# Patient Record
Sex: Female | Born: 1955
Health system: Southern US, Community
[De-identification: ages and names within clinical notes are randomized; demographics above are authoritative.]

## PROBLEM LIST (undated history)

## (undated) DIAGNOSIS — M069 Rheumatoid arthritis, unspecified: Secondary | ICD-10-CM

## (undated) DIAGNOSIS — D6949 Other primary thrombocytopenia: Secondary | ICD-10-CM

## (undated) DIAGNOSIS — M2550 Pain in unspecified joint: Secondary | ICD-10-CM

## (undated) DIAGNOSIS — M549 Dorsalgia, unspecified: Secondary | ICD-10-CM

## (undated) DIAGNOSIS — R002 Palpitations: Secondary | ICD-10-CM

## (undated) DIAGNOSIS — M1990 Unspecified osteoarthritis, unspecified site: Secondary | ICD-10-CM

## (undated) DIAGNOSIS — E785 Hyperlipidemia, unspecified: Secondary | ICD-10-CM

## (undated) DIAGNOSIS — R5383 Other fatigue: Secondary | ICD-10-CM

## (undated) DIAGNOSIS — Z5189 Encounter for other specified aftercare: Secondary | ICD-10-CM

## (undated) DIAGNOSIS — R252 Cramp and spasm: Secondary | ICD-10-CM

## (undated) DIAGNOSIS — R609 Edema, unspecified: Secondary | ICD-10-CM

## (undated) DIAGNOSIS — Z8619 Personal history of other infectious and parasitic diseases: Secondary | ICD-10-CM

## (undated) DIAGNOSIS — C50919 Malignant neoplasm of unspecified site of unspecified female breast: Secondary | ICD-10-CM

## (undated) DIAGNOSIS — M3119 Other thrombotic microangiopathy: Secondary | ICD-10-CM

## (undated) DIAGNOSIS — B1920 Unspecified viral hepatitis C without hepatic coma: Secondary | ICD-10-CM

## (undated) DIAGNOSIS — T4145XA Adverse effect of unspecified anesthetic, initial encounter: Secondary | ICD-10-CM

## (undated) DIAGNOSIS — Z923 Personal history of irradiation: Secondary | ICD-10-CM

## (undated) DIAGNOSIS — E119 Type 2 diabetes mellitus without complications: Secondary | ICD-10-CM

## (undated) DIAGNOSIS — H9319 Tinnitus, unspecified ear: Secondary | ICD-10-CM

## (undated) DIAGNOSIS — K219 Gastro-esophageal reflux disease without esophagitis: Secondary | ICD-10-CM

## (undated) DIAGNOSIS — R1310 Dysphagia, unspecified: Secondary | ICD-10-CM

## (undated) DIAGNOSIS — I499 Cardiac arrhythmia, unspecified: Secondary | ICD-10-CM

## (undated) DIAGNOSIS — H5710 Ocular pain, unspecified eye: Secondary | ICD-10-CM

## (undated) DIAGNOSIS — T7840XA Allergy, unspecified, initial encounter: Secondary | ICD-10-CM

## (undated) DIAGNOSIS — Z87898 Personal history of other specified conditions: Secondary | ICD-10-CM

## (undated) DIAGNOSIS — F419 Anxiety disorder, unspecified: Secondary | ICD-10-CM

## (undated) DIAGNOSIS — T8859XA Other complications of anesthesia, initial encounter: Secondary | ICD-10-CM

## (undated) DIAGNOSIS — E039 Hypothyroidism, unspecified: Secondary | ICD-10-CM

## (undated) DIAGNOSIS — M311 Thrombotic microangiopathy: Secondary | ICD-10-CM

## (undated) DIAGNOSIS — Z9109 Other allergy status, other than to drugs and biological substances: Secondary | ICD-10-CM

## (undated) DIAGNOSIS — G20A1 Parkinson's disease without dyskinesia, without mention of fluctuations: Secondary | ICD-10-CM

## (undated) DIAGNOSIS — R05 Cough: Secondary | ICD-10-CM

## (undated) DIAGNOSIS — K7460 Unspecified cirrhosis of liver: Secondary | ICD-10-CM

## (undated) DIAGNOSIS — M8580 Other specified disorders of bone density and structure, unspecified site: Secondary | ICD-10-CM

## (undated) DIAGNOSIS — R490 Dysphonia: Secondary | ICD-10-CM

## (undated) DIAGNOSIS — Z8679 Personal history of other diseases of the circulatory system: Secondary | ICD-10-CM

## (undated) DIAGNOSIS — R0602 Shortness of breath: Secondary | ICD-10-CM

## (undated) DIAGNOSIS — G709 Myoneural disorder, unspecified: Secondary | ICD-10-CM

## (undated) DIAGNOSIS — R059 Cough, unspecified: Secondary | ICD-10-CM

## (undated) DIAGNOSIS — R7303 Prediabetes: Secondary | ICD-10-CM

## (undated) DIAGNOSIS — M7989 Other specified soft tissue disorders: Secondary | ICD-10-CM

## (undated) DIAGNOSIS — R209 Unspecified disturbances of skin sensation: Secondary | ICD-10-CM

## (undated) HISTORY — DX: Tinnitus, unspecified ear: H93.19

## (undated) HISTORY — DX: Pain in unspecified joint: M25.50

## (undated) HISTORY — DX: Cough, unspecified: R05.9

## (undated) HISTORY — DX: Cardiac arrhythmia, unspecified: I49.9

## (undated) HISTORY — DX: Type 2 diabetes mellitus without complications: E11.9

## (undated) HISTORY — PX: OTHER SURGICAL HISTORY: SHX169

## (undated) HISTORY — DX: Dorsalgia, unspecified: M54.9

## (undated) HISTORY — PX: TOTAL HIP ARTHROPLASTY: SHX124

## (undated) HISTORY — DX: Personal history of irradiation: Z92.3

## (undated) HISTORY — DX: Hyperlipidemia, unspecified: E78.5

## (undated) HISTORY — DX: Personal history of other diseases of the circulatory system: Z86.79

## (undated) HISTORY — DX: Other specified soft tissue disorders: M79.89

## (undated) HISTORY — DX: Personal history of other infectious and parasitic diseases: Z86.19

## (undated) HISTORY — DX: Unspecified osteoarthritis, unspecified site: M19.90

## (undated) HISTORY — PX: POLYPECTOMY: SHX149

## (undated) HISTORY — DX: Unspecified disturbances of skin sensation: R20.9

## (undated) HISTORY — DX: Other fatigue: R53.83

## (undated) HISTORY — DX: Allergy, unspecified, initial encounter: T78.40XA

## (undated) HISTORY — DX: Other specified disorders of bone density and structure, unspecified site: M85.80

## (undated) HISTORY — DX: Myoneural disorder, unspecified: G70.9

## (undated) HISTORY — DX: Unspecified cirrhosis of liver: K74.60

## (undated) HISTORY — DX: Dysphagia, unspecified: R13.10

## (undated) HISTORY — DX: Parkinson's disease without dyskinesia, without mention of fluctuations: G20.A1

## (undated) HISTORY — DX: Gastro-esophageal reflux disease without esophagitis: K21.9

## (undated) HISTORY — DX: Malignant neoplasm of unspecified site of unspecified female breast: C50.919

## (undated) HISTORY — DX: Prediabetes: R73.03

## (undated) HISTORY — DX: Other primary thrombocytopenia: D69.49

## (undated) HISTORY — DX: Unspecified viral hepatitis C without hepatic coma: B19.20

## (undated) HISTORY — DX: Ocular pain, unspecified eye: H57.10

## (undated) HISTORY — DX: Cough: R05

## (undated) HISTORY — PX: CATARACT EXTRACTION, BILATERAL: SHX1313

## (undated) HISTORY — DX: Palpitations: R00.2

## (undated) HISTORY — PX: FOOT SURGERY: SHX648

## (undated) HISTORY — DX: Dysphonia: R49.0

## (undated) HISTORY — DX: Rheumatoid arthritis, unspecified: M06.9

## (undated) HISTORY — DX: Cramp and spasm: R25.2

## (undated) HISTORY — DX: Personal history of other specified conditions: Z87.898

---

## 2007-06-22 LAB — HM COLONOSCOPY

## 2010-08-31 DIAGNOSIS — C50919 Malignant neoplasm of unspecified site of unspecified female breast: Secondary | ICD-10-CM

## 2010-08-31 HISTORY — DX: Malignant neoplasm of unspecified site of unspecified female breast: C50.919

## 2011-06-30 LAB — HM PAP SMEAR: HM Pap smear: NORMAL

## 2011-07-03 ENCOUNTER — Other Ambulatory Visit: Payer: Self-pay | Admitting: *Deleted

## 2011-07-03 ENCOUNTER — Other Ambulatory Visit: Payer: Self-pay | Admitting: Radiology

## 2011-07-03 DIAGNOSIS — C50912 Malignant neoplasm of unspecified site of left female breast: Secondary | ICD-10-CM

## 2011-07-03 DIAGNOSIS — C50919 Malignant neoplasm of unspecified site of unspecified female breast: Secondary | ICD-10-CM

## 2011-07-06 ENCOUNTER — Other Ambulatory Visit: Payer: Self-pay | Admitting: *Deleted

## 2011-07-06 DIAGNOSIS — C50919 Malignant neoplasm of unspecified site of unspecified female breast: Secondary | ICD-10-CM

## 2011-07-07 ENCOUNTER — Ambulatory Visit
Admission: RE | Admit: 2011-07-07 | Discharge: 2011-07-07 | Disposition: A | Discharge status: Home / Self Care | Payer: Managed Care, Other (non HMO) | Source: Ambulatory Visit | Attending: Radiology | Admitting: Radiology

## 2011-07-07 ENCOUNTER — Other Ambulatory Visit: Payer: Self-pay

## 2011-07-07 DIAGNOSIS — C50912 Malignant neoplasm of unspecified site of left female breast: Secondary | ICD-10-CM

## 2011-07-07 MED ORDER — GADOBENATE DIMEGLUMINE 529 MG/ML IV SOLN
20.0000 mL | Freq: Once | INTRAVENOUS | Status: AC | PRN
  Administered 2011-07-07: 20 mL via INTRAVENOUS

## 2011-07-08 ENCOUNTER — Encounter: Payer: Self-pay | Admitting: Oncology

## 2011-07-08 ENCOUNTER — Ambulatory Visit: Payer: Managed Care, Other (non HMO)

## 2011-07-08 ENCOUNTER — Ambulatory Visit (HOSPITAL_BASED_OUTPATIENT_CLINIC_OR_DEPARTMENT_OTHER): Payer: Managed Care, Other (non HMO) | Admitting: General Surgery

## 2011-07-08 ENCOUNTER — Ambulatory Visit
Admission: RE | Admit: 2011-07-08 | Discharge: 2011-07-08 | Disposition: A | Discharge status: Home / Self Care | Payer: Managed Care, Other (non HMO) | Source: Ambulatory Visit | Attending: Radiation Oncology | Admitting: Radiation Oncology

## 2011-07-08 ENCOUNTER — Ambulatory Visit: Payer: Managed Care, Other (non HMO) | Attending: General Surgery | Admitting: Physical Therapy

## 2011-07-08 ENCOUNTER — Encounter (INDEPENDENT_AMBULATORY_CARE_PROVIDER_SITE_OTHER): Payer: Self-pay | Admitting: General Surgery

## 2011-07-08 ENCOUNTER — Other Ambulatory Visit (HOSPITAL_BASED_OUTPATIENT_CLINIC_OR_DEPARTMENT_OTHER): Payer: Managed Care, Other (non HMO) | Admitting: Lab

## 2011-07-08 ENCOUNTER — Ambulatory Visit (HOSPITAL_BASED_OUTPATIENT_CLINIC_OR_DEPARTMENT_OTHER): Payer: Managed Care, Other (non HMO) | Admitting: Oncology

## 2011-07-08 VITALS — BP 122/77 | HR 66 | Temp 98.6°F | Ht 67.0 in | Wt 248.9 lb

## 2011-07-08 DIAGNOSIS — C50919 Malignant neoplasm of unspecified site of unspecified female breast: Secondary | ICD-10-CM

## 2011-07-08 DIAGNOSIS — Z170 Estrogen receptor positive status [ER+]: Secondary | ICD-10-CM

## 2011-07-08 DIAGNOSIS — C50419 Malignant neoplasm of upper-outer quadrant of unspecified female breast: Secondary | ICD-10-CM

## 2011-07-08 DIAGNOSIS — IMO0001 Reserved for inherently not codable concepts without codable children: Secondary | ICD-10-CM | POA: Insufficient documentation

## 2011-07-08 DIAGNOSIS — D0590 Unspecified type of carcinoma in situ of unspecified breast: Secondary | ICD-10-CM | POA: Insufficient documentation

## 2011-07-08 LAB — CBC WITH DIFFERENTIAL/PLATELET
BASO%: 0.2 % (ref 0.0–2.0)
Basophils Absolute: 0 10*3/uL (ref 0.0–0.1)
EOS%: 6.5 % (ref 0.0–7.0)
Eosinophils Absolute: 0.3 10*3/uL (ref 0.0–0.5)
HCT: 40.1 % (ref 34.8–46.6)
HGB: 13.4 g/dL (ref 11.6–15.9)
LYMPH%: 16.4 % (ref 14.0–49.7)
MCH: 32 pg (ref 25.1–34.0)
MCHC: 33.4 g/dL (ref 31.5–36.0)
MCV: 95.9 fL (ref 79.5–101.0)
MONO#: 0.4 10*3/uL (ref 0.1–0.9)
MONO%: 8.3 % (ref 0.0–14.0)
NEUT#: 3.6 10*3/uL (ref 1.5–6.5)
NEUT%: 68.6 % (ref 38.4–76.8)
Platelets: 136 10*3/uL — ABNORMAL LOW (ref 145–400)
RBC: 4.19 10*6/uL (ref 3.70–5.45)
RDW: 14.2 % (ref 11.2–14.5)
WBC: 5.3 10*3/uL (ref 3.9–10.3)
lymph#: 0.9 10*3/uL (ref 0.9–3.3)

## 2011-07-08 LAB — COMPREHENSIVE METABOLIC PANEL
ALT: 38 U/L — ABNORMAL HIGH (ref 0–35)
AST: 34 U/L (ref 0–37)
Albumin: 3.7 g/dL (ref 3.5–5.2)
Alkaline Phosphatase: 63 U/L (ref 39–117)
BUN: 10 mg/dL (ref 6–23)
CO2: 30 mEq/L (ref 19–32)
Calcium: 9 mg/dL (ref 8.4–10.5)
Chloride: 102 mEq/L (ref 96–112)
Creatinine, Ser: 0.75 mg/dL (ref 0.50–1.10)
Glucose, Bld: 102 mg/dL — ABNORMAL HIGH (ref 70–99)
Potassium: 3.9 mEq/L (ref 3.5–5.3)
Sodium: 142 mEq/L (ref 135–145)
Total Bilirubin: 0.4 mg/dL (ref 0.3–1.2)
Total Protein: 7.3 g/dL (ref 6.0–8.3)

## 2011-07-08 LAB — CANCER ANTIGEN 27.29: CA 27.29: 29 U/mL (ref 0–39)

## 2011-07-08 NOTE — Progress Notes (Signed)
[facility] Radiation Oncology NEW PATIENT EVALUATION  Name: Tara [name] MRN: [id number]  Date: 07/08/2011  DOB: [birth year]  Status:outpatient    CC:No primary provider on file.  Robyne Askew, MD    REFERRING PHYSICIAN: Robyne Askew, MD   DIAGNOSIS: Stage I (T1, N0, M0) invasive ductal/DCIS of the left breast    HISTORY OF PRESENT ILLNESS::Tara [name] is a 55 y.o. female who is seen today for evaluation of her stage I (T1, N0, M0) invasive ductal/DCIS of the left breast. Her to moving to [facility], she noted a mass within the tail of the left breast. She underwent mammography on 06/24/2011 at [facility] in [place]. This showed a suspicious abnormality in the left breast measuring approximately 1.0 cm. A tiny cluster of microcalcifications were seen for posteriorly in the axillary tail of the left breast. On ultrasound the mass measured 1.1 x 0.9 x 0.7 cm. A core needle biopsy 06/25/2011 was diagnostic for invasive ductal carcinoma, grade 3, measuring 0.5 cm in greatest dimension. Her tumor was ER/PR positive with a proliferation index of 25%. She also had DCIS, intermediate grade to high grade with focal central necrosis. Microcalcifications were present and associated with carcinoma. I am told that her tumor was HER-2/neu positive she and her husband moved to [facility] and referral to the [facility] C. was made by Dr. Isaiah Serge. Breast MRI on 07/07/2011 showed a 1.6 x 1.4 x 1.1 cm irregular enhancing mass deep within the upper outer quadrant of left breast with an adjacent biopsy marker clip. No other suspicious findings were noted. She is without complaints today. She seen today along with Dr. Carolynne Edouard and also Dr. Darnelle Catalan.    PREVIOUS RADIATION THERAPY: No   PAST MEDICAL HISTORY:  has a past medical history of Thrombocytopenia, primary; Hepatitis C; Asthma; and Arthritis.     PAST SURGICAL HISTORY: Past Surgical History  Procedure Date  . Elbow pins   .  Right hip replacement   . Left foot reconstruction   . Ortho left foot surg    . Joint replacement      ETIOLOGIC FACTORS:   FAMILY HISTORY: family history is not on file.   SOCIAL HISTORY:  reports that she has never smoked. She does not have any smokeless tobacco history on file. She reports that she drinks about 1.2 ounces of alcohol per week.   ALLERGIES: Aspirin   MEDICATIONS:Current outpatient prescriptions:budesonide (PULMICORT) 180 MCG/ACT inhaler, Inhale 1 puff into the lungs 2 (two) times daily.  , Disp: , Rfl: ;  calcium carbonate (TUMS - DOSED IN MG ELEMENTAL CALCIUM) 500 MG chewable tablet, Chew 1 tablet by mouth daily.  , Disp: , Rfl: ;  citalopram (CELEXA) 20 MG tablet, Take 20 mg by mouth daily.  , Disp: , Rfl: ;  digoxin (LANOXIN) 0.25 MG tablet, Take 250 mcg by mouth daily.  , Disp: , Rfl:  glucosamine-chondroitin 500-400 MG tablet, Take 1 tablet by mouth 3 (three) times daily.  , Disp: , Rfl: ;  levothyroxine (SYNTHROID, LEVOTHROID) 112 MCG tablet, Take 0.5 mcg by mouth daily.  , Disp: , Rfl: ;  montelukast (SINGULAIR) 10 MG tablet, Take 10 mg by mouth at bedtime.  , Disp: , Rfl: ;  vitamin B-12 (CYANOCOBALAMIN) 100 MCG tablet, Take 50 mcg by mouth daily.  , Disp: , Rfl:    REVIEW OF SYSTEMS:  Detailed ROS obtained by Dr. Darnelle Catalan, reviewed, and placed in EMR.    PHYSICAL EXAM: Alert and  oriented 55 year old white female appearing her stated age. Vital signs blood pressure 122/77 pulse 66 respiratory rate 20 Head and neck examination grossly unremarkable. Nodes: Without palpable cervical, supraclavicular, or axillary lymphadenopathy. Chest: Lungs clear. Back: Without spinal discomfort. Heart: Regular in rhythm. Breasts: There is a palpable mass along the axillary tail of the left breast at 2:00. The mass is approximately 2.0 cm in greatest diameter. There is overlying biopsy wound. No other masses are appreciated. Right breast without masses or lesions. Abdomen without  hepatomegaly. Extremities 2+ ankle edema. Neurologic examination grossly nonfocal.   LABORATORY DATA:  Results for orders placed in visit on 07/08/11 (from the past 48 hour(s))  CBC WITH DIFFERENTIAL     Status: Abnormal   Collection Time   07/08/11 12:27 PM      Component Value Range Comment   WBC 5.3  3.9 - 10.3 (10e3/uL)    NEUT# 3.6  1.5 - 6.5 (10e3/uL)    HGB 13.4  11.6 - 15.9 (g/dL)    HCT 40.9  81.1 - 91.4 (%)    Platelets 136 (*) 145 - 400 (10e3/uL)    MCV 95.9  79.5 - 101.0 (fL)    MCH 32.0  25.1 - 34.0 (pg)    MCHC 33.4  31.5 - 36.0 (g/dL)    RBC 7.82  9.56 - 2.13 (10e6/uL)    RDW 14.2  11.2 - 14.5 (%)    lymph# 0.9  0.9 - 3.3 (10e3/uL)    MONO# 0.4  0.1 - 0.9 (10e3/uL)    Eosinophils Absolute 0.3  0.0 - 0.5 (10e3/uL)    Basophils Absolute 0.0  0.0 - 0.1 (10e3/uL)    NEUT% 68.6  38.4 - 76.8 (%)    LYMPH% 16.4  14.0 - 49.7 (%)    MONO% 8.3  0.0 - 14.0 (%)    EOS% 6.5  0.0 - 7.0 (%)    BASO% 0.2  0.0 - 2.0 (%)   COMPREHENSIVE METABOLIC PANEL     Status: Abnormal   Collection Time   07/08/11 12:27 PM      Component Value Range Comment   Sodium 142  135 - 145 (mEq/L)    Potassium 3.9  3.5 - 5.3 (mEq/L)    Chloride 102  96 - 112 (mEq/L)    CO2 30  19 - 32 (mEq/L)    Glucose, Bld 102 (*) 70 - 99 (mg/dL)    BUN 10  6 - 23 (mg/dL)    Creatinine, Ser 0.86  0.50 - 1.10 (mg/dL)    Total Bilirubin 0.4  0.3 - 1.2 (mg/dL)    Alkaline Phosphatase 63  39 - 117 (U/L)    AST 34  0 - 37 (U/L)    ALT 38 (*) 0 - 35 (U/L)    Total Protein 7.3  6.0 - 8.3 (g/dL)    Albumin 3.7  3.5 - 5.2 (g/dL)    Calcium 9.0  8.4 - 10.5 (mg/dL)        RADIOGRAPHY: Mr Breast Bilateral W Wo Contrast  07/07/2011  *RADIOLOGY REPORT*  Clinical Data: Recently diagnosed left breast invasive ductal carcinoma and ductal carcinoma in situ.  BUN and creatinine were obtained on site at [facility] at [address] Results:  BUN 10 mg/dL,  Creatinine 0.7 mg/dL.  BILATERAL BREAST MRI WITH AND  WITHOUT CONTRAST  Technique: Multiplanar, multisequence MR images of both breasts were obtained prior to and following the intravenous administration of 20ml of MultiHance.  Three dimensional images  were evaluated at the independent DynaCad workstation.  Comparison:  Recent mammogram, ultrasound and biopsy examinations at [facility] in [place], [place].  Findings: 1.6 x 1.4 x 1.1 cm irregular enhancing mass deep in the upper outer quadrant of the left breast with an adjacent biopsy marker clip artifact medially.  This mass has predominately persistent enhancement kinetics.  Minimal background parenchymal enhancement elsewhere in both breasts with no additional masses or areas of enhancement suspicious for malignancy in either breast.  No abnormal appearing lymph nodes.  A partially included a 1.2 cm cyst is noted in the right lobe of the liver.  IMPRESSION: 1.6 x 1.4 x 1.1 cm biopsy-proven invasive ductal carcinoma and ductal carcinoma in situ deep in the upper outer quadrant of the left breast.  Otherwise, unremarkable examination.  THREE-DIMENSIONAL MR IMAGE RENDERING ON INDEPENDENT WORKSTATION:  Three-dimensional MR images were rendered by post-processing of the original MR data on an independent workstation.  The three- dimensional MR images were interpreted, and findings were reported in the accompanying complete MRI report for this study.  BI-RADS CATEGORY 6:  Known biopsy-proven malignancy - appropriate action should be taken.  Recommendation:  Treatment plan.  Original Report Authenticated By: Darrol Angel, M.D.       IMPRESSION: Stage I (T1, N0, M0) invasive ductal/DCIS of the left breast. I explained to the patient and her husband that her local treatment options include mastectomy or partial mastectomy followed by radiation therapy. She will also require a sentinel lymph node biopsy. Dr. Darnelle Catalan indicates that she will be offered systemic therapy including Herceptin for one year. I can see  her back for a followup visit following her surgery to finalize a treatment plan. I would consider obtaining a postoperative mammogram at least one month following her surgery to confirm removal of all suspicious microcalcifications. From a technical standpoint, if she is node negative, we could consider prone breast radiation provided that we can encompass her tumor bed. If not, we could consider deep inspiration and breath-hold tangential field radiation therapy to avoid cardiac toxicity.   PLAN: As discussed above. Followup visit with me postoperatively. I spent 40 minutes minutes face to face with the patient and more than 50% of that time was spent in counseling and/or coordination of care.

## 2011-07-08 NOTE — Progress Notes (Signed)
Tara [name] is an 55 y.o. female.    Chief Complaint  Patient presents with  . Breast Cancer    HPI: Tara [name] is a 55 year old woman from [place], [place], recently moved to [place] and establishing yourself in her care today  The patient noted a mass in her left breast and brought her to her physician's attention in [place]. She was developed for mammography 06/24/2011 at [facility] in [place]. This showed a suspicious abnormality in the left breast measuring approximately 10 mm. Physical examination showed a 1 cm firm superficial mass in the axillary region of the breast and ultrasound showed this to be 11 mm and spiculated. Biopsy was performed October 25 and showed ([id number] at the [facility]) and invasive ductal carcinoma, grade 3, estrogen receptor 95% positive progesterone receptor 40% positive with an MIB-1 of 29% the tumor is HDR 2 positive by FISH with a ratio of 2.5. There is tumor heterogeneity noted.  With this information the case was presented this morning at the multidisciplinary breast cancer conference and is being again reviewed this afternoon at the multidisciplinary breast cancer clinic  Past Medical History  Diagnosis Date  Thrombotic thrombocytopenia purpura was diagnosed at age 58 treated with vincristine and pheresis x32, no splenectomy; she had recurrence at age 67 while being treated with interferon for hepatitis C.     . Hepatitis C   . Asthma   . Arthritis     Past Surgical History  Procedure Date  . Elbow pins   . Right hip replacement   . Left foot reconstruction   . 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 Her husband [name] at present today is a Magazine features editor. They recently moved here from [place] and still have not settle down on house or apartment  She  reports that she has never smoked. She does not have any smokeless tobacco history on file. She reports that she drinks about 1.2 ounces of alcohol per week.   Health maintenance:  Cholesterol  Bone density November 20 11th showed a T score of +0.9 and this is normal   Colonoscopy   Most recent Pap smear October 20 12th    the patient does have advanced directives in place   Allergies:  Allergies  Allergen Reactions  . Aspirin     On TTP    No current outpatient prescriptions on file as of 07/08/2011.   Medications Prior to Admission  Medication Dose Route Frequency Provider Last Rate Last Dose  . gadobenate dimeglumine (MULTIHANCE) injection 20 mL  20 mL Intravenous Once PRN Medication Radiologist   20 mL at 07/07/11 1027    ROS she reports no symptoms suggestive of metastatic disease she does have a history of a chronic fatigue occasional leg cramping and she has had chronic bilateral lower extremity swelling since she was a child she doesn't usually where compression stockings for this. He is a little bit of ringing in her ear is just a bridge but  otherwise no problems with her teeth she has a history of palpitations this has been evaluated previously and found to be stress related it is much better now that she is on antidepressants. She has a history of hepatitis C positivity is noted but this has not been active she has a history of eczema and some arthritis but overall he detailed review of systems today was noncontributory   Physical Exam:  Blood pressure 122/77, pulse 66, temperature 98.6 F (37 C), height 5\' 7"  (1.702 m), weight 248 lb 14.4 oz (112.9 kg).  Sclerae unicteric Oropharynx clear No  peripheral adenopathy Lungs no rales or rhonchi Heart regular rate and rhythm Abd benign MSK no focal spinal tenderness, no peripheral edema Neuro: nonfocal Breasts: Right breast no suspicious masses left breast I do not palpate any masses in the left axilla and I do not palpate a well-defined mass in the left breast itself the skin is unremarkable there is no nipple retraction   CBC Lab Results  Component Value Date   HGB 13.4 07/08/2011   HCT 40.1 07/08/2011   MCV 95.9 07/08/2011   PLT 136* 07/08/2011   CMP     Component Value Date/Time   NA 142 07/08/2011 1227   K 3.9 07/08/2011 1227   CL 102 07/08/2011 1227   CO2 30 07/08/2011 1227   GLUCOSE 102* 07/08/2011 1227   BUN 10 07/08/2011 1227   CREATININE 0.75 07/08/2011 1227   CALCIUM 9.0 07/08/2011 1227   PROT 7.3 07/08/2011 1227   ALBUMIN 3.7 07/08/2011 1227   AST 34 07/08/2011 1227   ALT 38* 07/08/2011 1227   ALKPHOS 63 07/08/2011 1227   BILITOT 0.4 07/08/2011 1227     Mr Breast Bilateral W Wo Contrast  07/07/2011  *RADIOLOGY REPORT*  Clinical Data: Recently diagnosed left breast invasive ductal carcinoma and ductal carcinoma in situ.  BUN and creatinine were obtained on site at [facility] at [address] Results:  BUN 10 mg/dL,  Creatinine 0.7 mg/dL.  BILATERAL BREAST MRI WITH AND WITHOUT CONTRAST  Technique: Multiplanar, multisequence MR images of both breasts were obtained prior to and following the intravenous administration of 20ml of MultiHance.  Three dimensional images were evaluated at the independent DynaCad workstation.  Comparison:  Recent mammogram, ultrasound and biopsy examinations at [facility] in [place], [place].  Findings: 1.6 x 1.4 x 1.1 cm irregular enhancing mass deep in the upper outer quadrant of the left breast with an adjacent biopsy marker clip artifact medially.  This mass has predominately persistent enhancement kinetics.  Minimal background parenchymal enhancement elsewhere in both breasts  with no additional masses or areas of enhancement suspicious for malignancy in either breast.  No abnormal appearing lymph nodes.  A partially included a 1.2 cm cyst is noted in the right lobe of the liver.  IMPRESSION: 1.6 x 1.4 x 1.1 cm biopsy-proven invasive ductal carcinoma and ductal carcinoma in situ deep in the upper outer quadrant of the left breast.  Otherwise, unremarkable examination.  THREE-DIMENSIONAL MR IMAGE RENDERING ON INDEPENDENT WORKSTATION:  Three-dimensional MR images were rendered by post-processing of the original MR data on an independent workstation.  The three- dimensional MR images were interpreted, and findings were reported in the accompanying complete MRI report for this study.  BI-RADS CATEGORY 6:  Known biopsy-proven malignancy - appropriate action should be taken.  Recommendation:  Treatment plan.  Original Report Authenticated By: Darrol Angel, M.D.     Assessment: 55 year old [place] woman recently moved  here from [place] with a new diagnosis of invasive ductal breast cancer made through left breast biopsy October of 2012 for what by MRI is a 1.6 cm tumor, pathologically grade 3, triple positive, with some evidence of tumor heterogeneity, and MIB-1 of 25%  Plan: The patient's situation is complex of it she were not HER-2 positive then as a stage I patient and her mid 64s I would recommend an Oncotype however being HER-2 positive she does not really fit well in the adjuvant! Database and her risk of recurrence is likely higher than would be predicted there are nurses it predicts 31% is probably closer to 40-45% given the HER-2 positivity assuming as she would not take and the HER-2 medication).  Accordingly I would offer this patient chemotherapy specifically  cyclophosphamide and docetaxel given by port every 3 weeks x4 she would receive Herceptin every 3 weeks for one year after she completes her radiation following chemotherapy she would then go on anti-estrogens for 5  years I believe that with this treatment which is standard her risk of recurrence within 10 years would be well less than 10%.  We discussed side effects toxicities and complications of treatment she will also come to chemotherapy school and she will have an echocardiogram performed before her upcoming surgery she will have a polyp were placed at the time of surgery and she will return to see me the first week in December so we can operation to lyse her chemotherapy plans and writer the prescriptions for the nausea and the other preventive medicines.  Imogine Carvell C 07/08/2011, 2:37 PM

## 2011-07-08 NOTE — Progress Notes (Signed)
Subjective:     Patient ID: Tara [name], female   DOB: [birth year], 55 y.o.   MRN: [id number]  HPI We're asked to see the patient in consultation by Dr. Zella Richer to evaluate her for a left breast cancer. The patient is a 55 year old white female who recently felt a lump in the upper outer portion of her left breast.at the time she was living in [place]. She brought this to her medical doctor's attention. A mammogram and biopsies were obtained that revealed an invasive ductal cancer. An MRI was obtained that estimated the size of the tumor 1.6 cm. The tumor is located in the tail of [name]. She denies any breast pain. She denies any discharge from the nipple. She was ER PR positive and HER-2 positive.  Review of Systems  Constitutional: Negative.   HENT: Negative.   Eyes: Negative.   Respiratory: Negative.   Cardiovascular: Positive for palpitations.  Gastrointestinal: Negative.   Genitourinary: Negative.   Musculoskeletal: Negative.   Skin: Negative.   Neurological: Negative.   Hematological: Bruises/bleeds easily.  Psychiatric/Behavioral: Negative.        Objective:   Physical Exam  Constitutional: She is oriented to person, place, and time. She appears well-developed and well-nourished.  HENT:  Head: Normocephalic and atraumatic.  Eyes: Conjunctivae and EOM are normal. Pupils are equal, round, and reactive to light.  Neck: Normal range of motion. Neck supple.  Cardiovascular: Normal rate, regular rhythm and normal heart sounds.   Pulmonary/Chest: Effort normal and breath sounds normal.       The patient has a palpable mass approximately 2 cm in diameter in the upper outer quadrant of the left breast. No axillary supraclavicular cervical lymphadenopathy.  Abdominal: Soft. Bowel sounds are normal. She exhibits no mass. There is no tenderness.  Musculoskeletal: Normal range of motion.  Neurological: She is alert and oriented to person, place, and time.  Skin: Skin is warm and  dry.  Psychiatric: She has a normal mood and affect. Her behavior is normal.       Assessment:     The patient has a 1.6 cm area of invasive breast cancer in the upper outer quadrant of the left breast.I've discussed with her the options for surgery and I think she would be a good candidate for breast conservation and sentinel node mapping. She would like to pursue breast conservation. I've discussed with her in detail the risks and benefits of the operation as well as some of the technical aspects and she understands and wishes to proceed. She may also need a port. It may depend on what her echocardiogram looks like some she does have some history of heart disease. I've discussed this with her as well including the risks and benefits as well as some of the technical aspects and she understands and wishes to proceed    Plan:     First the patient will get an echocardiogram to evaluate her heart function. After that we will plan for lumpectomy and sentinel node mapping with possible port placement

## 2011-07-09 ENCOUNTER — Encounter: Payer: Self-pay | Admitting: *Deleted

## 2011-07-09 ENCOUNTER — Telehealth: Payer: Self-pay | Admitting: *Deleted

## 2011-07-09 ENCOUNTER — Other Ambulatory Visit (INDEPENDENT_AMBULATORY_CARE_PROVIDER_SITE_OTHER): Payer: Self-pay | Admitting: General Surgery

## 2011-07-10 ENCOUNTER — Other Ambulatory Visit (INDEPENDENT_AMBULATORY_CARE_PROVIDER_SITE_OTHER): Payer: Self-pay | Admitting: General Surgery

## 2011-07-10 ENCOUNTER — Telehealth: Payer: Self-pay | Admitting: Oncology

## 2011-07-10 ENCOUNTER — Telehealth: Payer: Self-pay | Admitting: *Deleted

## 2011-07-10 NOTE — Telephone Encounter (Signed)
Spoke to pt concerning [facility] from 07/08/11.  Discussed dx and staging.  Confirmed dates for echo, chemotherapy school, f/u with Dr. Darnelle Catalan and [place] [facility].  Pt denies needs or concerns at this time.  Encourage pt to call with questions.  Received verbal understanding.  Contact information given.

## 2011-07-10 NOTE — Telephone Encounter (Signed)
Called pt and r/s appt on 12/03 per pts request to 12/10

## 2011-07-15 ENCOUNTER — Other Ambulatory Visit (HOSPITAL_COMMUNITY): Payer: Managed Care, Other (non HMO)

## 2011-07-16 ENCOUNTER — Encounter: Payer: Self-pay | Admitting: *Deleted

## 2011-07-16 NOTE — Progress Notes (Signed)
Mailed after appt letter to pt. 

## 2011-07-20 ENCOUNTER — Other Ambulatory Visit: Payer: Managed Care, Other (non HMO)

## 2011-07-20 ENCOUNTER — Encounter (HOSPITAL_COMMUNITY): Payer: Self-pay | Admitting: Pharmacy Technician

## 2011-07-20 ENCOUNTER — Other Ambulatory Visit: Payer: Self-pay | Admitting: Oncology

## 2011-07-21 ENCOUNTER — Encounter (HOSPITAL_COMMUNITY)
Admission: RE | Admit: 2011-07-21 | Discharge: 2011-07-21 | Disposition: A | Discharge status: Home / Self Care | Payer: Managed Care, Other (non HMO) | Source: Ambulatory Visit | Attending: General Surgery | Admitting: General Surgery

## 2011-07-21 ENCOUNTER — Ambulatory Visit (HOSPITAL_COMMUNITY)
Admission: RE | Admit: 2011-07-21 | Discharge: 2011-07-21 | Disposition: A | Discharge status: Home / Self Care | Payer: Managed Care, Other (non HMO) | Source: Ambulatory Visit | Attending: General Surgery | Admitting: General Surgery

## 2011-07-21 ENCOUNTER — Encounter (HOSPITAL_COMMUNITY): Payer: Self-pay

## 2011-07-21 DIAGNOSIS — M47814 Spondylosis without myelopathy or radiculopathy, thoracic region: Secondary | ICD-10-CM | POA: Insufficient documentation

## 2011-07-21 DIAGNOSIS — Z01812 Encounter for preprocedural laboratory examination: Secondary | ICD-10-CM | POA: Insufficient documentation

## 2011-07-21 DIAGNOSIS — C50919 Malignant neoplasm of unspecified site of unspecified female breast: Secondary | ICD-10-CM | POA: Insufficient documentation

## 2011-07-21 DIAGNOSIS — Z01818 Encounter for other preprocedural examination: Secondary | ICD-10-CM | POA: Insufficient documentation

## 2011-07-21 DIAGNOSIS — M4120 Other idiopathic scoliosis, site unspecified: Secondary | ICD-10-CM | POA: Insufficient documentation

## 2011-07-21 HISTORY — DX: Encounter for other specified aftercare: Z51.89

## 2011-07-21 HISTORY — DX: Other thrombotic microangiopathy: M31.19

## 2011-07-21 HISTORY — DX: Hypothyroidism, unspecified: E03.9

## 2011-07-21 HISTORY — DX: Adverse effect of unspecified anesthetic, initial encounter: T41.45XA

## 2011-07-21 HISTORY — DX: Cardiac arrhythmia, unspecified: I49.9

## 2011-07-21 HISTORY — DX: Anxiety disorder, unspecified: F41.9

## 2011-07-21 HISTORY — DX: Shortness of breath: R06.02

## 2011-07-21 HISTORY — DX: Other allergy status, other than to drugs and biological substances: Z91.09

## 2011-07-21 HISTORY — DX: Other complications of anesthesia, initial encounter: T88.59XA

## 2011-07-21 HISTORY — DX: Thrombotic microangiopathy: M31.1

## 2011-07-21 LAB — CBC
HCT: 40.9 % (ref 36.0–46.0)
Hemoglobin: 13.4 g/dL (ref 12.0–15.0)
MCH: 31.5 pg (ref 26.0–34.0)
MCHC: 32.8 g/dL (ref 30.0–36.0)
MCV: 96 fL (ref 78.0–100.0)
Platelets: 136 10*3/uL — ABNORMAL LOW (ref 150–400)
RBC: 4.26 MIL/uL (ref 3.87–5.11)
RDW: 13.9 % (ref 11.5–15.5)
WBC: 6.4 10*3/uL (ref 4.0–10.5)

## 2011-07-21 LAB — COMPREHENSIVE METABOLIC PANEL
ALT: 40 U/L — ABNORMAL HIGH (ref 0–35)
AST: 38 U/L — ABNORMAL HIGH (ref 0–37)
Albumin: 3.8 g/dL (ref 3.5–5.2)
Alkaline Phosphatase: 65 U/L (ref 39–117)
BUN: 11 mg/dL (ref 6–23)
CO2: 30 mEq/L (ref 19–32)
Calcium: 8.9 mg/dL (ref 8.4–10.5)
Chloride: 98 mEq/L (ref 96–112)
Creatinine, Ser: 0.67 mg/dL (ref 0.50–1.10)
GFR calc Af Amer: >90 mL/min (ref >90)
GFR calc non Af Amer: >90 mL/min (ref >90)
Glucose, Bld: 96 mg/dL (ref 70–99)
Potassium: 4.5 mEq/L (ref 3.5–5.1)
Sodium: 138 mEq/L (ref 135–145)
Total Bilirubin: 0.9 mg/dL (ref 0.3–1.2)
Total Protein: 7.5 g/dL (ref 6.0–8.3)

## 2011-07-21 LAB — SURGICAL PCR SCREEN
MRSA, PCR: NEGATIVE
Staphylococcus aureus: NEGATIVE

## 2011-07-21 NOTE — Pre-Procedure Instructions (Signed)
20 Tara [name]  07/21/2011   Your procedure is scheduled on:  December 3  Report to [facility] at 9:30 AM.  Call this number if you have problems the morning of surgery: [phone]   Remember:   Do not eat food:After Midnight.  May drink clear liquids until: 4 Hours before arrival.  Take these medicines the morning of surgery with A SIP OF WATER: Albuterol inhaler (bring to surgery), Budesonide inhaler, Celexa, Digoxin, Synthroid, Singulair   Do not wear jewelry, make-up or nail polish.  Do not wear lotions, powders, or perfumes. You may wear deodorant.  Do not shave 48 hours prior to surgery.  Do not bring valuables to the hospital.  Contacts, dentures or bridgework may not be worn into surgery.  Leave suitcase in the car. After surgery it may be brought to your room.  For patients admitted to the hospital, checkout time is 11:00 AM the day of discharge.   Patients discharged the day of surgery will not be allowed to drive home.  Name and phone number of your driver: [name], [phone]  Special Instructions: CHG Shower Use Special Wash: 1/2 bottle night before surgery and 1/2 bottle morning of surgery.   Please read over the following fact sheets that you were given: Pain Booklet, Coughing and Deep Breathing and Surgical Site Infection Prevention

## 2011-07-21 NOTE — Progress Notes (Signed)
Pt having EKG at [facility] on 11/21. Gave pt fax number for [facility] and asked that she have them fax it over to [name].

## 2011-07-22 ENCOUNTER — Other Ambulatory Visit (INDEPENDENT_AMBULATORY_CARE_PROVIDER_SITE_OTHER): Payer: Self-pay | Admitting: General Surgery

## 2011-07-22 ENCOUNTER — Ambulatory Visit (HOSPITAL_COMMUNITY)
Admission: RE | Admit: 2011-07-22 | Discharge: 2011-07-22 | Disposition: A | Discharge status: Home / Self Care | Payer: Managed Care, Other (non HMO) | Source: Ambulatory Visit | Attending: Oncology | Admitting: Oncology

## 2011-07-22 DIAGNOSIS — C50912 Malignant neoplasm of unspecified site of left female breast: Secondary | ICD-10-CM

## 2011-07-22 DIAGNOSIS — C50919 Malignant neoplasm of unspecified site of unspecified female breast: Secondary | ICD-10-CM | POA: Insufficient documentation

## 2011-07-22 DIAGNOSIS — I517 Cardiomegaly: Secondary | ICD-10-CM

## 2011-07-22 DIAGNOSIS — I499 Cardiac arrhythmia, unspecified: Secondary | ICD-10-CM | POA: Insufficient documentation

## 2011-07-31 ENCOUNTER — Encounter (HOSPITAL_COMMUNITY): Payer: Self-pay | Admitting: Pharmacy Technician

## 2011-08-02 MED ORDER — CEFAZOLIN SODIUM-DEXTROSE 2-3 GM-% IV SOLR
2.0000 g | INTRAVENOUS | Status: AC
  Administered 2011-08-03: 2 g via INTRAVENOUS
  Filled 2011-08-02: qty 50

## 2011-08-03 ENCOUNTER — Other Ambulatory Visit (INDEPENDENT_AMBULATORY_CARE_PROVIDER_SITE_OTHER): Payer: Self-pay | Admitting: General Surgery

## 2011-08-03 ENCOUNTER — Ambulatory Visit (HOSPITAL_COMMUNITY): Payer: Managed Care, Other (non HMO) | Admitting: Anesthesiology

## 2011-08-03 ENCOUNTER — Ambulatory Visit (HOSPITAL_COMMUNITY): Payer: Managed Care, Other (non HMO)

## 2011-08-03 ENCOUNTER — Encounter (HOSPITAL_COMMUNITY): Payer: Self-pay | Admitting: Anesthesiology

## 2011-08-03 ENCOUNTER — Other Ambulatory Visit: Payer: Self-pay

## 2011-08-03 ENCOUNTER — Ambulatory Visit: Payer: Managed Care, Other (non HMO) | Admitting: Oncology

## 2011-08-03 ENCOUNTER — Encounter (HOSPITAL_COMMUNITY)
Admission: RE | Disposition: A | Discharge status: Home / Self Care | Payer: Self-pay | Source: Ambulatory Visit | Attending: General Surgery

## 2011-08-03 ENCOUNTER — Other Ambulatory Visit (HOSPITAL_COMMUNITY): Payer: Managed Care, Other (non HMO)

## 2011-08-03 ENCOUNTER — Ambulatory Visit (HOSPITAL_COMMUNITY)
Admission: RE | Admit: 2011-08-03 | Discharge: 2011-08-03 | Disposition: A | Discharge status: Home / Self Care | Payer: Managed Care, Other (non HMO) | Source: Ambulatory Visit | Attending: General Surgery | Admitting: General Surgery

## 2011-08-03 DIAGNOSIS — C50919 Malignant neoplasm of unspecified site of unspecified female breast: Secondary | ICD-10-CM

## 2011-08-03 DIAGNOSIS — E039 Hypothyroidism, unspecified: Secondary | ICD-10-CM | POA: Insufficient documentation

## 2011-08-03 DIAGNOSIS — J45909 Unspecified asthma, uncomplicated: Secondary | ICD-10-CM | POA: Insufficient documentation

## 2011-08-03 DIAGNOSIS — R443 Hallucinations, unspecified: Secondary | ICD-10-CM | POA: Insufficient documentation

## 2011-08-03 DIAGNOSIS — D0590 Unspecified type of carcinoma in situ of unspecified breast: Secondary | ICD-10-CM | POA: Insufficient documentation

## 2011-08-03 DIAGNOSIS — Z170 Estrogen receptor positive status [ER+]: Secondary | ICD-10-CM | POA: Insufficient documentation

## 2011-08-03 DIAGNOSIS — C50419 Malignant neoplasm of upper-outer quadrant of unspecified female breast: Secondary | ICD-10-CM | POA: Insufficient documentation

## 2011-08-03 DIAGNOSIS — K219 Gastro-esophageal reflux disease without esophagitis: Secondary | ICD-10-CM | POA: Insufficient documentation

## 2011-08-03 DIAGNOSIS — B1920 Unspecified viral hepatitis C without hepatic coma: Secondary | ICD-10-CM | POA: Insufficient documentation

## 2011-08-03 HISTORY — PX: PORTACATH PLACEMENT: SHX2246

## 2011-08-03 HISTORY — PX: BREAST LUMPECTOMY: SHX2

## 2011-08-03 SURGERY — BREAST LUMPECTOMY WITH SENTINEL LYMPH NODE BX
Anesthesia: Choice | Site: Chest | Laterality: Right | Wound class: Clean

## 2011-08-03 MED ORDER — MIDAZOLAM HCL 5 MG/5ML IJ SOLN
INTRAMUSCULAR | Status: DC | PRN
  Administered 2011-08-03 (×2): 1 mg via INTRAVENOUS

## 2011-08-03 MED ORDER — HYDROCODONE-ACETAMINOPHEN 5-325 MG PO TABS: 1.0000 | ORAL_TABLET | ORAL | Status: DC | PRN

## 2011-08-03 MED ORDER — LACTATED RINGERS IV SOLN: INTRAVENOUS | Status: DC

## 2011-08-03 MED ORDER — ONDANSETRON HCL 4 MG/2ML IJ SOLN: 4.0000 mg | Freq: Four times a day (QID) | INTRAMUSCULAR | Status: DC | PRN

## 2011-08-03 MED ORDER — TECHNETIUM TC 99M SULFUR COLLOID FILTERED
1.0000 | Freq: Once | INTRAVENOUS | Status: AC | PRN
  Administered 2011-08-03: 1 via INTRADERMAL

## 2011-08-03 MED ORDER — SODIUM CHLORIDE 0.9 % IR SOLN
Status: DC | PRN
  Administered 2011-08-03: 1

## 2011-08-03 MED ORDER — PROPOFOL 10 MG/ML IV EMUL
INTRAVENOUS | Status: DC | PRN
  Administered 2011-08-03: 200 mg via INTRAVENOUS

## 2011-08-03 MED ORDER — BUPIVACAINE-EPINEPHRINE 0.25% -1:200000 IJ SOLN
INTRAMUSCULAR | Status: DC | PRN
  Administered 2011-08-03: 16 mL

## 2011-08-03 MED ORDER — DEXAMETHASONE SODIUM PHOSPHATE 10 MG/ML IJ SOLN
INTRAMUSCULAR | Status: DC | PRN
  Administered 2011-08-03: 8 mg via INTRAVENOUS

## 2011-08-03 MED ORDER — SODIUM CHLORIDE 0.9 % IJ SOLN
INTRAMUSCULAR | Status: DC | PRN
  Administered 2011-08-03: 13:00:00

## 2011-08-03 MED ORDER — FENTANYL CITRATE 0.05 MG/ML IJ SOLN
INTRAMUSCULAR | Status: DC | PRN
  Administered 2011-08-03 (×3): 50 ug via INTRAVENOUS
  Administered 2011-08-03: 25 ug via INTRAVENOUS
  Administered 2011-08-03 (×2): 50 ug via INTRAVENOUS
  Administered 2011-08-03: 25 ug via INTRAVENOUS
  Administered 2011-08-03: 50 ug via INTRAVENOUS

## 2011-08-03 MED ORDER — LACTATED RINGERS IV SOLN
INTRAVENOUS | Status: DC | PRN
  Administered 2011-08-03 (×2): via INTRAVENOUS

## 2011-08-03 MED ORDER — PROMETHAZINE HCL 25 MG/ML IJ SOLN: 12.5000 mg | Freq: Four times a day (QID) | INTRAMUSCULAR | Status: DC | PRN

## 2011-08-03 MED ORDER — HEPARIN SOD (PORK) LOCK FLUSH 100 UNIT/ML IV SOLN
INTRAVENOUS | Status: DC | PRN
  Administered 2011-08-03: 500 [IU]

## 2011-08-03 MED ORDER — MIDAZOLAM HCL 2 MG/2ML IJ SOLN
INTRAMUSCULAR | Status: AC
  Filled 2011-08-03: qty 2

## 2011-08-03 MED ORDER — SODIUM CHLORIDE 0.9 % IR SOLN
Status: DC | PRN
  Administered 2011-08-03: 13:00:00

## 2011-08-03 MED ORDER — ONDANSETRON HCL 4 MG/2ML IJ SOLN
INTRAMUSCULAR | Status: DC | PRN
  Administered 2011-08-03: 4 mg via INTRAVENOUS

## 2011-08-03 MED ORDER — HYDROMORPHONE HCL PF 1 MG/ML IJ SOLN: 0.2500 mg | INTRAMUSCULAR | Status: DC | PRN

## 2011-08-03 MED ORDER — PROMETHAZINE HCL 25 MG/ML IJ SOLN: 6.2500 mg | INTRAMUSCULAR | Status: DC | PRN

## 2011-08-03 SURGICAL SUPPLY — 75 items
APPLIER CLIP 9.375 MED OPEN (MISCELLANEOUS) ×3
BAG DECANTER FOR FLEXI CONT (MISCELLANEOUS) ×3 IMPLANT
BINDER BREAST LRG (GAUZE/BANDAGES/DRESSINGS) IMPLANT
BINDER BREAST XLRG (GAUZE/BANDAGES/DRESSINGS) IMPLANT
BLADE SURG 10 STRL SS (BLADE) IMPLANT
BLADE SURG 15 STRL LF DISP TIS (BLADE) IMPLANT
BLADE SURG 15 STRL SS (BLADE)
CANISTER SUCTION 2500CC (MISCELLANEOUS) ×3 IMPLANT
CHLORAPREP W/TINT 10.5 ML (MISCELLANEOUS) ×3 IMPLANT
CHLORAPREP W/TINT 26ML (MISCELLANEOUS) ×3 IMPLANT
CLIP APPLIE 9.375 MED OPEN (MISCELLANEOUS) ×2 IMPLANT
CLOTH BEACON ORANGE TIMEOUT ST (SAFETY) ×3 IMPLANT
CONT SPEC 4OZ CLIKSEAL STRL BL (MISCELLANEOUS) ×6 IMPLANT
COVER PROBE W GEL 5X96 (DRAPES) ×3 IMPLANT
COVER SURGICAL LIGHT HANDLE (MISCELLANEOUS) ×3 IMPLANT
CRADLE DONUT ADULT HEAD (MISCELLANEOUS) ×3 IMPLANT
DECANTER SPIKE VIAL GLASS SM (MISCELLANEOUS) IMPLANT
DERMABOND ADHESIVE PROPEN (GAUZE/BANDAGES/DRESSINGS) ×2
DERMABOND ADVANCED (GAUZE/BANDAGES/DRESSINGS) ×1
DERMABOND ADVANCED .7 DNX12 (GAUZE/BANDAGES/DRESSINGS) ×2 IMPLANT
DERMABOND ADVANCED .7 DNX6 (GAUZE/BANDAGES/DRESSINGS) ×4 IMPLANT
DEVICE DUBIN SPECIMEN MAMMOGRA (MISCELLANEOUS) ×3 IMPLANT
DRAIN CHANNEL 19F RND (DRAIN) ×3 IMPLANT
DRAPE C-ARM 42X72 X-RAY (DRAPES) ×3 IMPLANT
DRAPE CHEST BREAST 15X10 FENES (DRAPES) IMPLANT
DRAPE LAPAROSCOPIC ABDOMINAL (DRAPES) ×3 IMPLANT
DRAPE UTILITY 15X26 W/TAPE STR (DRAPE) ×6 IMPLANT
ELECT CAUTERY BLADE 6.4 (BLADE) ×3 IMPLANT
ELECT COATED BLADE 2.86 ST (ELECTRODE) ×3 IMPLANT
ELECT REM PT RETURN 9FT ADLT (ELECTROSURGICAL) ×3
ELECTRODE REM PT RTRN 9FT ADLT (ELECTROSURGICAL) ×2 IMPLANT
EVACUATOR SILICONE 100CC (DRAIN) ×3 IMPLANT
GAUZE SPONGE 2X2 8PLY STRL LF (GAUZE/BANDAGES/DRESSINGS) ×2 IMPLANT
GAUZE SPONGE 4X4 12PLY STRL LF (GAUZE/BANDAGES/DRESSINGS) ×3 IMPLANT
GAUZE SPONGE 4X4 16PLY XRAY LF (GAUZE/BANDAGES/DRESSINGS) ×3 IMPLANT
GLOVE BIO SURGEON STRL SZ7.5 (GLOVE) ×12 IMPLANT
GOWN STRL NON-REIN LRG LVL3 (GOWN DISPOSABLE) ×9 IMPLANT
INTRODUCER COOK 11FR (CATHETERS) IMPLANT
KIT BASIN OR (CUSTOM PROCEDURE TRAY) ×3 IMPLANT
KIT MARKER MARGIN INK (KITS) IMPLANT
KIT PORT POWER 9.6FR MRI PREA (Catheter) IMPLANT
KIT PORT POWER ISP 8FR (Catheter) IMPLANT
KIT POWER CATH 8FR (Catheter) ×3 IMPLANT
KIT ROOM TURNOVER OR (KITS) ×3 IMPLANT
NEEDLE 18GX1X1/2 (RX/OR ONLY) (NEEDLE) ×3 IMPLANT
NEEDLE HYPO 25GX1X1/2 BEV (NEEDLE) ×6 IMPLANT
NS IRRIG 1000ML POUR BTL (IV SOLUTION) ×3 IMPLANT
PACK SURGICAL SETUP 50X90 (CUSTOM PROCEDURE TRAY) ×3 IMPLANT
PAD ARMBOARD 7.5X6 YLW CONV (MISCELLANEOUS) ×6 IMPLANT
PENCIL BUTTON HOLSTER BLD 10FT (ELECTRODE) ×3 IMPLANT
SET INTRODUCER 12FR PACEMAKER (SHEATH) IMPLANT
SET SHEATH INTRODUCER 10FR (MISCELLANEOUS) IMPLANT
SHEATH COOK PEEL AWAY SET 9F (SHEATH) IMPLANT
SPONGE GAUZE 2X2 STER 10/PKG (GAUZE/BANDAGES/DRESSINGS) ×1
SPONGE LAP 18X18 X RAY DECT (DISPOSABLE) IMPLANT
STAPLER VISISTAT 35W (STAPLE) ×3 IMPLANT
SUT ETHILON 2 0 FS 18 (SUTURE) ×3 IMPLANT
SUT MNCRL AB 4-0 PS2 18 (SUTURE) ×6 IMPLANT
SUT MON AB 4-0 PC3 18 (SUTURE) ×3 IMPLANT
SUT PROLENE 2 0 SH 30 (SUTURE) ×6 IMPLANT
SUT SILK 2 0 (SUTURE) ×1
SUT SILK 2 0 SH (SUTURE) IMPLANT
SUT SILK 2-0 18XBRD TIE 12 (SUTURE) ×2 IMPLANT
SUT VIC AB 3-0 SH 27 (SUTURE) ×1
SUT VIC AB 3-0 SH 27XBRD (SUTURE) ×2 IMPLANT
SYR 20ML ECCENTRIC (SYRINGE) ×6 IMPLANT
SYR 5ML LUER SLIP (SYRINGE) ×3 IMPLANT
SYR BULB 3OZ (MISCELLANEOUS) ×3 IMPLANT
SYR CONTROL 10ML LL (SYRINGE) ×6 IMPLANT
TAPE CLOTH SURG 4X10 WHT LF (GAUZE/BANDAGES/DRESSINGS) ×3 IMPLANT
TOWEL OR 17X24 6PK STRL BLUE (TOWEL DISPOSABLE) ×3 IMPLANT
TOWEL OR 17X26 10 PK STRL BLUE (TOWEL DISPOSABLE) ×3 IMPLANT
TUBE CONNECTING 12X1/4 (SUCTIONS) IMPLANT
WATER STERILE IRR 1000ML POUR (IV SOLUTION) IMPLANT
YANKAUER SUCT BULB TIP NO VENT (SUCTIONS) IMPLANT

## 2011-08-03 NOTE — Interval H&P Note (Signed)
History and Physical Interval Note:  08/03/2011 12:24 PM  Tara [name]  has presented today for surgery, with the diagnosis of left breast cancer, sentinel lymph node biopsy,injection blue dye possible insert port-a-cath  The various methods of treatment have been discussed with the patient and family. After consideration of risks, benefits and other options for treatment, the patient has consented to  Procedure(s): BREAST LUMPECTOMY WITH SENTINEL LYMPH NODE BX INSERTION PORT-A-CATH as a surgical intervention .  The patients' history has been reviewed, patient examined, no change in status, stable for surgery.  I have reviewed the patients' chart and labs.  Questions were answered to the patient's satisfaction.     TOTH III,Kenric Ginger S

## 2011-08-03 NOTE — H&P (View-Only) (Signed)
Subjective:     Patient ID: Tara [name], female   DOB: 02/01/1956, 55 y.o.   MRN: [id number]  HPI We're asked to see the patient in consultation by Dr. Margaret Bertran to evaluate her for a left breast cancer. The patient is a 55-year-old white female who recently felt a lump in the upper outer portion of her left breast.at the time she was living in [place]. She brought this to her medical doctor's attention. A mammogram and biopsies were obtained that revealed an invasive ductal cancer. An MRI was obtained that estimated the size of the tumor 1.6 cm. The tumor is located in the tail of Spence. She denies any breast pain. She denies any discharge from the nipple. She was ER PR positive and HER-2 positive.  Review of Systems  Constitutional: Negative.   HENT: Negative.   Eyes: Negative.   Respiratory: Negative.   Cardiovascular: Positive for palpitations.  Gastrointestinal: Negative.   Genitourinary: Negative.   Musculoskeletal: Negative.   Skin: Negative.   Neurological: Negative.   Hematological: Bruises/bleeds easily.  Psychiatric/Behavioral: Negative.        Objective:   Physical Exam  Constitutional: She is oriented to person, place, and time. She appears well-developed and well-nourished.  HENT:  Head: Normocephalic and atraumatic.  Eyes: Conjunctivae and EOM are normal. Pupils are equal, round, and reactive to light.  Neck: Normal range of motion. Neck supple.  Cardiovascular: Normal rate, regular rhythm and normal heart sounds.   Pulmonary/Chest: Effort normal and breath sounds normal.       The patient has a palpable mass approximately 2 cm in diameter in the upper outer quadrant of the left breast. No axillary supraclavicular cervical lymphadenopathy.  Abdominal: Soft. Bowel sounds are normal. She exhibits no mass. There is no tenderness.  Musculoskeletal: Normal range of motion.  Neurological: She is alert and oriented to person, place, and time.  Skin: Skin is warm and  dry.  Psychiatric: She has a normal mood and affect. Her behavior is normal.       Assessment:     The patient has a 1.6 cm area of invasive breast cancer in the upper outer quadrant of the left breast.I've discussed with her the options for surgery and I think she would be a good candidate for breast conservation and sentinel node mapping. She would like to pursue breast conservation. I've discussed with her in detail the risks and benefits of the operation as well as some of the technical aspects and she understands and wishes to proceed. She may also need a port. It may depend on what her echocardiogram looks like some she does have some history of heart disease. I've discussed this with her as well including the risks and benefits as well as some of the technical aspects and she understands and wishes to proceed    Plan:     First the patient will get an echocardiogram to evaluate her heart function. After that we will plan for lumpectomy and sentinel node mapping with possible port placement      

## 2011-08-03 NOTE — Anesthesia Postprocedure Evaluation (Signed)
  Anesthesia Post-op Note  Patient: Tara [name]  Procedure(s) Performed:  BREAST LUMPECTOMY WITH SENTINEL LYMPH NODE BX - Left lumpectomy and sentinel lymph node biopsy, injection blue dye; INSERTION PORT-A-CATH  Patient Location: PACU  Anesthesia Type: General  Level of Consciousness: awake and alert   Airway and Oxygen Therapy: Patient Spontanous Breathing  Post-op Pain: mild  Post-op Assessment: Post-op Vital signs reviewed, Patient's Cardiovascular Status Stable, Respiratory Function Stable, Patent Airway, No signs of Nausea or vomiting and Pain level controlled  Post-op Vital Signs: Reviewed and stable  Complications: No apparent anesthesia complications

## 2011-08-03 NOTE — Progress Notes (Signed)
Report to s. Lisette Abu as primary caregiver.

## 2011-08-03 NOTE — Op Note (Signed)
08/03/2011  3:13 PM  PATIENT:  Tara [name]  55 y.o. female  PRE-OPERATIVE DIAGNOSIS:  left breast cancer  POST-OPERATIVE DIAGNOSIS:  left breast cancer  PROCEDURE:  Procedure(s): BREAST LUMPECTOMY WITH SENTINEL LYMPH NODE BX INSERTION PORT-A-CATH  SURGEON:  Surgeon(s): Caleen Essex III, MD  PHYSICIAN ASSISTANT:   ASSISTANTS: none   ANESTHESIA:   general  EBL:  Total I/O In: 1200 [I.V.:1200] Out: -   BLOOD ADMINISTERED:none  DRAINS: (1) Jackson-Pratt drain(s) with closed bulb suction in the left axilla   LOCAL MEDICATIONS USED:  MARCAINE 20CC  SPECIMEN:  Excision  DISPOSITION OF SPECIMEN:  PATHOLOGY  COUNTS:  YES  TOURNIQUET:  * No tourniquets in log *  DICTATION: .Dragon Dictation  PLAN OF CARE: Discharge to home after PACU  PATIENT DISPOSITION:  PACU - hemodynamically stable.   Procedure: After informed consent was obtained the patient was brought to the operating room and placed in the supine position on the operating table. After adequate induction of general anesthesia the patient's chest bilaterally and left breast and axillary area were all prepped with ChloraPrep allowed to dry and draped in usual sterile manner. Earlier in the day the patient underwent injection of 1 mCi of technetium sulfur colloid in the subareolar position on the left breast. Attention was first turned to the right chest wall. The patient was  placed in Trendelenburg.a small incision was made lateral to the bend of the clavicle on the right. Using a large bore finder needle we probed behind the bend of the clavicle heading towards the sternal notch and were able to access the right subclavian vein without difficulty. A wire was placed through the needle using the Seldinger technique without difficulty. The wire was confirmed in the central venous system using real-time fluoroscopy. Next the incision on the right chest wall was extended medially and laterally. A subcutaneous pocket was created  by blunt finger dissection and some sharp dissection with the electrocautery. The port tubing was placed on the reservoir. The reservoir was placed in the pocket and the length of the tubing was estimated also using real-time fluoroscopy. The tubing was cut to length. Next a sheath and dilator were placed over the wire also using the Seldinger technique without difficulty. The wire and dilator were removed. The tubing was fed through the sheath as far as it can be fed and then held in place while the sheath was gently cracked and separated. More real-time fluoroscopy was used to identify the tip of the catheter. It appeared to be in the right atrium. A couple centimeters of tubing was removed and then reattached to the reservoir. The tip of the catheter was now in the distal superior vena cava. The anchor was then used to attach the tubing to the reservoir. The reservoir was anchored in the subcutaneous pocket with 2-0 Prolene stitches. The port was then aspirated and it aspirated blood easily. The port was then flushed and initially with a dilute heparin solution and then with a more concentrated heparin solution. The port flushed easily. The tissue over the core was closed with a deep layer of interrupted 3-0 Vicryl stitches. The skin was then closed with a running 4 Monocryl subcuticular stitch. Attention was then turned to the left breast. The palpable mass was in the upper outer quadrant tail of Spence area. 2 cc of methylene blue and 3 cc of injectable saline were also injected in the subareolar position and the breast was massaged for several minutes. A curvilinear incision  was made in the upper outer quadrant of the left breast near the axilla. The palpable mass was excised sharply with the electrocautery all the way down to the chest wall. A specimen radiograph was obtained that showed the mass to be within the specimen. I thought we might be a little close on the deep lateral margin so extra tissue was  taken from this area and labeled appropriately. Using the neoprobe to direct the dissection blunt dissection was carried out in the left axilla. 2 hot lymph nodes were identified. They were excised sharply with the electrocautery. A large vein was encountered and it was clamped with hemostats divided and ligated with 2-0 silk ties. This vein was very low in the axilla and breast tissue. Ex vivo counts on the 2 sentinel nodes were round 2800. Hemostasis was achieved using the Bovie electrocautery. The wound was irrigated with copious amounts of saline. The wound was infiltrated with quarter percent Marcaine. The cavity appeared to be too large so we decided to place a 92 [place] Blake drain in the cavity. The tissue was then closed over the drain with a deep layer of 3-0 Vicryl stitches. The skin was closed a running 4 Monocryl subcuticular stitch. The drain was anchored to the skin with a 3-0 nylon stitch. Dermabond dressings were then applied. The patient tolerated the procedure well. At the end of the case all needle sponge and instrument counts were correct. The patient was then awakened and taken to recovery in stable condition.

## 2011-08-03 NOTE — Anesthesia Preprocedure Evaluation (Addendum)
Anesthesia Evaluation  Patient identified by MRN, date of birth, ID band Patient awake    Reviewed: Allergy & Precautions, H&P , NPO status , Patient's Chart, lab work & pertinent test results  Airway Mallampati: I TM Distance: >3 FB Neck ROM: Full    Dental No notable dental hx. (+) Teeth Intact and Dental Advisory Given   Pulmonary asthma (daily pulmocort, no rescue inhaler in over a year) ,  clear to auscultation  Pulmonary exam normal       Cardiovascular Regular Normal H/o paroxysmal Afib, controlled with dig, 11/12 ECHO- normal LVF, trivial MR   Neuro/Psych    GI/Hepatic GERD-  Controlled,(+) Hepatitis -, C  Endo/Other  Hypothyroidism (takes Synthroid) Morbid obesity  Renal/GU negative Renal ROS     Musculoskeletal   Abdominal (+) obese,   Peds  Hematology   Anesthesia Other Findings   Reproductive/Obstetrics Breast cancer- no chemo or rad yet                           Anesthesia Physical Anesthesia Plan  ASA: III  Anesthesia Plan: General   Post-op Pain Management:    Induction: Intravenous  Airway Management Planned: LMA  Additional Equipment:   Intra-op Plan:   Post-operative Plan:   Informed Consent: I have reviewed the patients History and Physical, chart, labs and discussed the procedure including the risks, benefits and alternatives for the proposed anesthesia with the patient or authorized representative who has indicated his/her understanding and acceptance.   Dental advisory given  Plan Discussed with: CRNA and Surgeon  Anesthesia Plan Comments: (Plan routine monitors, GA- LMA OK)        Anesthesia Quick Evaluation

## 2011-08-03 NOTE — Transfer of Care (Signed)
Immediate Anesthesia Transfer of Care Note  Patient: Tara [name]  Procedure(s) Performed:  BREAST LUMPECTOMY WITH SENTINEL LYMPH NODE BX - Left lumpectomy and sentinel lymph node biopsy, injection blue dye; INSERTION PORT-A-CATH  Patient Location: PACU  Anesthesia Type: General  Level of Consciousness: awake  Airway & Oxygen Therapy: Patient Spontanous Breathing and Patient connected to face mask oxygen  Post-op Assessment: Report given to PACU RN and Post -op Vital signs reviewed and stable  Post vital signs: stable  Complications: No apparent anesthesia complications

## 2011-08-06 ENCOUNTER — Encounter (HOSPITAL_COMMUNITY): Payer: Self-pay | Admitting: General Surgery

## 2011-08-10 ENCOUNTER — Telehealth: Payer: Self-pay | Admitting: *Deleted

## 2011-08-10 ENCOUNTER — Ambulatory Visit: Payer: Managed Care, Other (non HMO) | Admitting: Oncology

## 2011-08-10 ENCOUNTER — Ambulatory Visit (HOSPITAL_BASED_OUTPATIENT_CLINIC_OR_DEPARTMENT_OTHER): Payer: Managed Care, Other (non HMO) | Admitting: Oncology

## 2011-08-10 VITALS — BP 121/72 | HR 70 | Temp 98.4°F | Ht 67.0 in | Wt 251.1 lb

## 2011-08-10 DIAGNOSIS — Z170 Estrogen receptor positive status [ER+]: Secondary | ICD-10-CM

## 2011-08-10 DIAGNOSIS — R768 Other specified abnormal immunological findings in serum: Secondary | ICD-10-CM

## 2011-08-10 DIAGNOSIS — C50919 Malignant neoplasm of unspecified site of unspecified female breast: Secondary | ICD-10-CM

## 2011-08-10 NOTE — Telephone Encounter (Signed)
gave patient appointment for 09-07-2011 lab ab chemo and 09-14-2011 lab ab printed out calendar and gave to the patient 

## 2011-08-10 NOTE — Telephone Encounter (Signed)
gave patient appointment for 09-07-2011 lab ab chemo and 09-14-2011 lab ab printed out calendar and gave to the patient

## 2011-08-10 NOTE — Progress Notes (Signed)
Tara [name] is an 55 y.o. female.    Chief Complaint  Patient presents with  . Breast Cancer    HPI: The patient noted a mass in her left breast and brought it to her physician's attention in [place], where she was residing until recently. She was set up for mammography 06/24/2011 at [facility] in [place], [place]. This showed a suspicious abnormality in the left breast measuring approximately 10 mm. Physical examination showed a 1 cm firm superficial mass in the axillary region of the breast and ultrasound showed this to be 11 mm and spiculated. Biopsy was performed June 25, 2011, and showed ([id number] at the [facility]) and invasive ductal carcinoma, grade 3, estrogen receptor 95% positive, progesterone receptor 40% positive, with an MIB-1 of 29%. The tumor is HER 2 positive by FISH with a ratio of 2.5. There is tumor heterogeneity noted.  With this information the case was presented Jul 08, 2011 at the multidisciplinary breast cancer conference and ithe patient was evaluated that afternoon at the multidisciplinary breast cancer clinic.  Interval history: Since her last visit here, the patient proceeded to definitive left lumpectomy and sentinel lymph node sampling 08/03/2011. This confirmed a 1.6 cm invasive ductal carcinoma, grade 3. Margins were ample. The two sentinel lymph nodes were clear. Repeat prognostic panel is pending. The patient's mother, Gracelyn Nurse, is present today and plans to be available at least through the first chemotherapy treatment.  Past Medical History  Diagnosis Date  Thrombotic thrombocytopenia purpura was diagnosed at age 25 treated with vincristine and pheresis x32, no splenectomy; she had recurrence at age 70 while being treated with interferon for hepatitis C.     . Hepatitis C   . Asthma   . Arthritis     Past Surgical History  Procedure Date  . Elbow pins     left  . Right hip replacement   . Left foot reconstruction     . Ortho left foot surg    . Joint replacement     bilat hip  . Portacath placement 08/03/2011    Procedure: INSERTION PORT-A-CATH;  Surgeon: Robyne Askew, MD;  Location: [facility] OR;  Service: General;  Laterality: Right;    Family history: Patient's father died at the age of 60 from emphysema in the setting of tobacco abuse patient's mother is alive at 30 the patient has one brother and one sister there is no history of breast or ovarian cancer in the immediate family   Gynecologic history: She is GX P0 menarche age 51 most recent period July of 2008 she never took hormone replacement   Social History: the patient is a homemaker; she used to work in an office previously. Her husband [name] is a Magazine features editor. They moved here from [facility] Oct 2012.  Health maintenance: She  reports that she has never smoked. She does not have any smokeless tobacco history on file. She reports that she drinks about 1.2 ounces of alcohol per week.   Cholesterol  Bone density November 2011 showed a T score of +0.9 and this is normal   Colonoscopy   Most recent Pap smear October 20 12th    the patient does have advanced directives in place   Allergies:  Allergies  Allergen Reactions  . Aspirin     Hx of TTP  . Nsaids     Hx TTP    Medications Prior to Admission  Medication Sig Dispense Refill  . acetaminophen (TYLENOL) 500 MG tablet Take  500 mg by mouth every 6 (six) hours as needed. For pain       . albuterol (PROVENTIL HFA;VENTOLIN HFA) 108 (90 BASE) MCG/ACT inhaler Inhale 2 puffs into the lungs every 6 (six) hours as needed. For shortness of breath       . calcium carbonate (TUMS - DOSED IN MG ELEMENTAL CALCIUM) 500 MG chewable tablet Chew 1 tablet by mouth daily as needed. For heart  burn      . cholecalciferol (VITAMIN D) 1000 UNITS tablet Take 2,000 Units by mouth daily.        . citalopram (CELEXA) 20 MG tablet Take 20 mg by mouth daily.       . clobetasol ointment (TEMOVATE) 0.05 % Apply 1 application  topically daily as needed. For psoraisis       . digoxin (LANOXIN) 0.25 MG tablet Take 250 mcg by mouth daily.       . fluticasone (FLONASE) 50 MCG/ACT nasal spray Place 2 sprays into the nose daily as needed. For congestion      . glucosamine-chondroitin 500-400 MG tablet Take 1 tablet by mouth daily.       . Halcinonide (HALOG) 0.1 % CREA Apply 1 application topically daily as needed. For eczema      . HYDROcodone-acetaminophen (NORCO) 5-325 MG per tablet Take 1-2 tablets by mouth every 4 (four) hours as needed for pain.  50 tablet  1  . levothyroxine (SYNTHROID, LEVOTHROID) 50 MCG tablet Take 50 mcg by mouth daily.        Marland Kitchen loperamide (IMODIUM A-D) 2 MG tablet Take 2 mg by mouth 4 (four) times daily as needed. For diarrhea       . montelukast (SINGULAIR) 10 MG tablet Take 10 mg by mouth at bedtime.        . pimecrolimus (ELIDEL) 1 % cream Apply 1 application topically 2 (two) times daily as needed. For exczema       . PULMICORT FLEXHALER 180 MCG/ACT inhaler INHALE 2 PUFFS BY MOUTH EVERY MORNING  3 Inhaler  0  . vitamin B-12 (CYANOCOBALAMIN) 100 MCG tablet Take 2,000 mcg by mouth daily.       Marland Kitchen OVER THE COUNTER MEDICATION Take 1 tablet by mouth daily as needed. For gas pain       No current facility-administered medications on file as of 08/10/2011.    ROS she did very well with the surgery, with no unusual pain, fever, rash, swelling, or dehiscence. She has occasional sharp or stinging pains in the left axillary scar area, but these are very brief and very infrequent. A detailed review of systems was otherwise noncontributory.  Physical Exam:  Blood pressure 121/72, pulse 70, temperature 98.4 F (36.9 C), height 5\' 7"  (1.702 m), weight 251 lb 1.6 oz (113.898 kg).  Sclerae unicteric Oropharynx clear No peripheral adenopathy Lungs no rales or rhonchi Heart regular rate and rhythm Abd benign MSK no focal spinal tenderness, no peripheral edema Neuro: nonfocal Breasts: Right breast no  suspicious masses. The left breast is status post recent surgery. A drain is still in place. The incisions show no dehiscence, or unusual swelling or erythema. The Port-A-Cath in the right anterior chest wall is also intact.  CBC Lab Results  Component Value Date   WBC 6.4 07/21/2011   HGB 13.4 07/21/2011   HCT 40.9 07/21/2011   MCV 96.0 07/21/2011   PLT 136* 07/21/2011   CMP     Component Value Date/Time   NA 138 07/21/2011 1109  K 4.5 07/21/2011 1109   CL 98 07/21/2011 1109   CO2 30 07/21/2011 1109   GLUCOSE 96 07/21/2011 1109   BUN 11 07/21/2011 1109   CREATININE 0.67 07/21/2011 1109   CALCIUM 8.9 07/21/2011 1109   PROT 7.5 07/21/2011 1109   ALBUMIN 3.8 07/21/2011 1109   AST 38* 07/21/2011 1109   ALT 40* 07/21/2011 1109   ALKPHOS 65 07/21/2011 1109   BILITOT 0.9 07/21/2011 1109   GFRNONAA >90 07/21/2011 1109   GFRAA >90 07/21/2011 1109    Studies: Echocardiogram November 21 shows an ejection fraction in the 55-60% range.  Assessment: 55 year old [place] woman  originally  from [place] status post left lumpectomy and sentinel lymph node sampling 08/03/2011 for a T1C N0 (Stage I) invasive ductal carcinoma, high-grade, triple positive per prior prognostic panel, with repeat pending.  She also has a history of remote thrombotic thrombocytopenic purpura and hepatitis C.  Plan: We had previously discussed the prognostic issues, and decided that she would warrant adjuvant chemotherapy. With we will confirm that decision today. She will receive docetaxel and cyclophosphamide together with trastuzumab every 3 weeks x4. The trastuzumab would continue to complete a year. I entered the chemotherapy orders and made her appointments for January 7 and January 14, with subsequent appointments to be made from that point. I wrote her prescriptions for Compazine and likely TobraDex eyedrops and dexamethasone. We will need to write her a prescription for ondansetron when she returns on  January 7. We will repeat echocardiograms every 3 months until she completes her year of trastuzumab. I encouraged her to keep a diary of symptoms through her first cycle, since subsequent cycles are likely to be pretty much the same as the first.  Mustafa Potts C 08/10/2011, 12:21 PM

## 2011-08-11 ENCOUNTER — Ambulatory Visit (INDEPENDENT_AMBULATORY_CARE_PROVIDER_SITE_OTHER): Payer: Managed Care, Other (non HMO) | Admitting: General Surgery

## 2011-08-11 ENCOUNTER — Encounter (INDEPENDENT_AMBULATORY_CARE_PROVIDER_SITE_OTHER): Payer: Self-pay | Admitting: General Surgery

## 2011-08-11 VITALS — BP 132/88 | HR 68 | Temp 97.8°F | Resp 16 | Ht 67.0 in | Wt 249.8 lb

## 2011-08-11 DIAGNOSIS — C50919 Malignant neoplasm of unspecified site of unspecified female breast: Secondary | ICD-10-CM

## 2011-08-11 NOTE — Patient Instructions (Signed)
No overhead activity for 1 week May shower on wed

## 2011-08-12 ENCOUNTER — Ambulatory Visit
Admission: RE | Admit: 2011-08-12 | Discharge: 2011-08-12 | Disposition: A | Discharge status: Home / Self Care | Payer: Managed Care, Other (non HMO) | Source: Ambulatory Visit | Attending: Radiation Oncology | Admitting: Radiation Oncology

## 2011-08-12 ENCOUNTER — Encounter: Payer: Self-pay | Admitting: Radiation Oncology

## 2011-08-12 VITALS — BP 121/68 | HR 71 | Temp 97.3°F | Resp 18 | Ht 67.0 in | Wt 252.1 lb

## 2011-08-12 DIAGNOSIS — C50919 Malignant neoplasm of unspecified site of unspecified female breast: Secondary | ICD-10-CM

## 2011-08-12 HISTORY — DX: Edema, unspecified: R60.9

## 2011-08-12 NOTE — Progress Notes (Signed)
A followup note:  Diagnosis: Stage I (T1, N0, M0) invasive ductal/DCIS of the left breast.  History: The patient returns today for review and evaluation of her T1, N0, M0 invasive ductal/DCIS of the left breast. I saw the patient in consultation on 07/08/2011. In [place], she was found to have a 1 cm suspicious abnormality along with a tiny cluster of microcalcifications posteriorly in the axillary tail of the left breast. A needle core biopsy 06/25/2011 was diagnostic for invasive ductal carcinoma, grade 3. The tumor was ER/PR positive with a proliferation index of 25%. She also had DCIS, intermediate to high-grade with focal necrosis. Her tumor was HER-2/neu positive. She was seen at the [facility] and she was felt to be a candidate for breast preservation. Dr. Carolynne Edouard performed a left partial mastectomy and sentinel lymph node biopsy on 08/03/2011.Marland Kitchen She was found have a 1.6 cm well-differentiated ductal carcinoma. The closest margin for both invasive and DCIS was 0.5 cm. 2 lymph nodes were free of metastatic disease. She is scheduled to start chemotherapy with Dr. Darnelle Catalan on 09/07/2011.  Physical examination: Head and neck examination grossly unremarkable. Nodes: Without cervical, supraclavicular, or axillary lymphadenopathy. Chest lungs clear. Back without spinal or CVA tenderness. Breasts there is no axillary tail wound along the left breast which is healing well. No masses are appreciated. She is large breasted. Extremities without edema.  Impression: Stage I (T1, N0, M0) invasive ductal/DCIS of the left breast. I explained to the patient that she is a candidate for breast preservation. We discussed the potential acute and late toxicities of radiation therapy. Since she is lymph node-negative, we will only need to treat her left breast. To limit her radiation skin toxicity I plan to treat her prone provided that we can encompass the entire tumor bed along her axillary tail. He should also be able to avoid her  heart in the prone position. I am also requesting a followup mammogram at [facility] to confirm removal of suspicious microcalcifications within the left breast. I hope to have this done by initiation of chemotherapy in early January. I've not scheduled Ms. Donohue for a formal followup visit, and I asked that Dr. Darnelle Catalan contact me near completion of her chemotherapy for a brief followup and then scheduling of her radiation therapy. Consent was signed today. 30 minutes was spent face-to-face with the patient, primarily counseling the patient.

## 2011-08-12 NOTE — Progress Notes (Signed)
HAD LUMPECTOMY WITH 2 LYMPH NODES REMOVED ON LEFT ALSO PAC PLACEMENT.   CHRONIC EDEMA IN CALVES AND ANKLES

## 2011-08-18 ENCOUNTER — Encounter: Payer: Self-pay | Admitting: Radiation Oncology

## 2011-08-18 NOTE — Progress Notes (Signed)
Quick Note:  No residual microcalcifications. ______

## 2011-08-26 ENCOUNTER — Encounter (INDEPENDENT_AMBULATORY_CARE_PROVIDER_SITE_OTHER): Payer: Self-pay | Admitting: General Surgery

## 2011-08-26 NOTE — Progress Notes (Signed)
Subjective:     Patient ID: Tara [name], female   DOB: 12-15-1955, 55 y.o.   MRN: [id number]  HPI The patient is a 55 yo female who is 8 days out from a left breast lumpectomy and neg sentinel node bx for a T1cN0 breast cancer with a port placement. She tolerated the surgery well. She has no complaints today other than some minor soreness  Review of Systems     Objective:   Physical Exam Her port site looks good. Her lumpectomy and node biopsy were done through the same incision. The incision looks good. The drain was putting out minimal fluid and was removed without difficulty    Assessment:     S/P left lumpectomy and neg sentinel node biopsy    Plan:     We will see her back in 1week. No overhead activity for 1 week. May shower tomorrow

## 2011-09-01 DIAGNOSIS — Z9221 Personal history of antineoplastic chemotherapy: Secondary | ICD-10-CM

## 2011-09-01 HISTORY — DX: Personal history of antineoplastic chemotherapy: Z92.21

## 2011-09-07 ENCOUNTER — Other Ambulatory Visit (HOSPITAL_BASED_OUTPATIENT_CLINIC_OR_DEPARTMENT_OTHER): Payer: Managed Care, Other (non HMO) | Admitting: Lab

## 2011-09-07 ENCOUNTER — Ambulatory Visit: Payer: Managed Care, Other (non HMO)

## 2011-09-07 ENCOUNTER — Other Ambulatory Visit: Payer: Self-pay | Admitting: Oncology

## 2011-09-07 ENCOUNTER — Ambulatory Visit (HOSPITAL_BASED_OUTPATIENT_CLINIC_OR_DEPARTMENT_OTHER): Payer: Managed Care, Other (non HMO) | Admitting: Physician Assistant

## 2011-09-07 ENCOUNTER — Telehealth: Payer: Self-pay | Admitting: Oncology

## 2011-09-07 ENCOUNTER — Encounter: Payer: Self-pay | Admitting: *Deleted

## 2011-09-07 ENCOUNTER — Encounter: Payer: Self-pay | Admitting: Physician Assistant

## 2011-09-07 VITALS — BP 145/79 | HR 81 | Temp 98.1°F | Ht 67.0 in | Wt 252.3 lb

## 2011-09-07 DIAGNOSIS — C50919 Malignant neoplasm of unspecified site of unspecified female breast: Secondary | ICD-10-CM

## 2011-09-07 DIAGNOSIS — E039 Hypothyroidism, unspecified: Secondary | ICD-10-CM | POA: Insufficient documentation

## 2011-09-07 DIAGNOSIS — Z9889 Other specified postprocedural states: Secondary | ICD-10-CM

## 2011-09-07 DIAGNOSIS — R768 Other specified abnormal immunological findings in serum: Secondary | ICD-10-CM

## 2011-09-07 DIAGNOSIS — R238 Other skin changes: Secondary | ICD-10-CM

## 2011-09-07 LAB — CBC WITH DIFFERENTIAL/PLATELET
BASO%: 0.1 % (ref 0.0–2.0)
Basophils Absolute: 0 10*3/uL (ref 0.0–0.1)
EOS%: 0 % (ref 0.0–7.0)
Eosinophils Absolute: 0 10*3/uL (ref 0.0–0.5)
HCT: 39.8 % (ref 34.8–46.6)
HGB: 13.6 g/dL (ref 11.6–15.9)
LYMPH%: 3 % — ABNORMAL LOW (ref 14.0–49.7)
MCH: 32.3 pg (ref 25.1–34.0)
MCHC: 34.2 g/dL (ref 31.5–36.0)
MCV: 94.4 fL (ref 79.5–101.0)
MONO#: 0.6 10*3/uL (ref 0.1–0.9)
MONO%: 3.9 % (ref 0.0–14.0)
NEUT#: 13.1 10*3/uL — ABNORMAL HIGH (ref 1.5–6.5)
NEUT%: 93 % — ABNORMAL HIGH (ref 38.4–76.8)
Platelets: 127 10*3/uL — ABNORMAL LOW (ref 145–400)
RBC: 4.21 10*6/uL (ref 3.70–5.45)
RDW: 13.7 % (ref 11.2–14.5)
WBC: 14.1 10*3/uL — ABNORMAL HIGH (ref 3.9–10.3)
lymph#: 0.4 10*3/uL — ABNORMAL LOW (ref 0.9–3.3)

## 2011-09-07 MED ORDER — CEPHALEXIN 500 MG PO CAPS: 500.0000 mg | ORAL_CAPSULE | Freq: Two times a day (BID) | ORAL | Status: AC

## 2011-09-07 MED ORDER — LEVOTHYROXINE SODIUM 50 MCG PO TABS: 50.0000 ug | ORAL_TABLET | Freq: Every day | ORAL | Status: DC

## 2011-09-07 MED ORDER — ONDANSETRON HCL 8 MG PO TABS: 8.0000 mg | ORAL_TABLET | Freq: Two times a day (BID) | ORAL | Status: DC | PRN

## 2011-09-07 NOTE — Progress Notes (Signed)
[facility] Psychosocial Distress Screening Clinical Social Work  Clinical Social Work was referred by distress screening protocol. The patient scored a 8 on the Psychosocial Distress Thermometer which indicates severe distress. Clinical Social Worker attempted to reach the patient to assess for distress and other psychosocial needs. Ms. Cothran was not available, CSW left VM requesting pt return call whenever convenient. Kathrin Penner, MSW, [facility] Clinical Social Worker [facility] [phone]

## 2011-09-07 NOTE — Telephone Encounter (Signed)
Gv pt appt for jan-feb2013.  scheduled appt with Dr. Sanda Linger @ [place] on 09/28/2011

## 2011-09-07 NOTE — Progress Notes (Signed)
Hematology and Oncology Follow Up Visit  Tara [name] [id number] 09/28/1955 56 y.o. 09/07/2011    HPI: Tara [name] noted a mass in her left breast and brought it to her physician's attention in [place], where she was residing until recently. She was set up for mammography 06/24/2011 at [facility] in [place], [place]. This showed a suspicious abnormality in the left breast measuring approximately 10 mm. Physical examination showed a 1 cm firm superficial mass in the axillary region of the breast and ultrasound showed this to be 11 mm and spiculated.   Biopsy was performed June 25, 2011, and showed ([id number] at the [facility]) and invasive ductal carcinoma, grade 3, estrogen receptor 95% positive, progesterone receptor 40% positive, with an MIB-1 of 29%. The tumor is HER 2 positive by FISH with a ratio of 2.5. There is tumor heterogeneity noted.    With this information the case was presented Jul 08, 2011 at the multidisciplinary breast cancer conference and ithe patient was evaluated that afternoon at the multidisciplinary breast cancer clinic. She proceeded to definitive left lumpectomy and sentinel lymph node sampling 08/03/2011. This confirmed a 1.6 cm invasive ductal carcinoma, grade 3. Margins were ample. The two sentinel lymph nodes were clear.   The decision was made to proceed with 4 cycles of adjuvant chemotherapy consisting of docetaxel/cyclophosphamide given with trastuzumab every 3 weeks with Neulasta on day 2 for granulocyte support Trastuzumab will be continued for total of one year.   Interim History:   Tara [name] is feeling well but is a little anxious to get started with her chemotherapy today. She has attended chemotherapy school. She had an echocardiogram showing an ejection fraction of 55-60%. She premedicated with oral dexamethasone yesterday as instructed. She also has her prochlorperazine on hand at home, but needs a prescription for ondansetron. Over  half of our 45 minute appointment today was spent reviewing her questions, counseling her regarding her treatment plan, and reviewing her antinausea regimen.  Tara [name]'s biggest complaint today is redness noted in the left breast. She has a known hematoma status post lumpectomy, and that area has been increasingly red over the last several days. It is slightly tender, but not painful. This morning, the breast is more red, especially in the central portion of the breast. No suspicious nodules. She's had no discharge from the nipple. No fevers, chills, or night sweats. She did have some hot flashes following the doses of dexamethasone yesterday. She was, however, able to sleep without problem.  At baseline, will mention that the patient has no signs of peripheral neuropathy in either the upper or lower extremities.  A detailed review of systems is otherwise noncontributory as noted below.  Review of Systems: Constitutional: fatigue,  no weight loss, fever, night sweats Eyes: negative WUJ:[id number] Cardiovascular: no chest pain or dyspnea on exertion Respiratory: no cough, shortness of breath, or wheezing Neurological: negative Dermatological: negative Gastrointestinal: no abdominal pain, nausea, emesis, change in bowel habits, or black or bloody stools Genito-Urinary: no dysuria, trouble voiding, or hematuria Hematological and Lymphatic: negative Breast: positive for - redness, left breast Musculoskeletal: negative Remaining ROS negative.  Family history: Patient's father died at the age of 42 from emphysema in the setting of tobacco abuse patient's mother is alive at 71 the patient has one brother and one sister there is no history of breast or ovarian cancer in the immediate family.  Gynecologic history: She is GX P0 menarche age 49 most recent period July of 2008 she never took hormone replacement  Social History: the patient is a homemaker; she used to work in an office previously. Her  husband [name] is a Magazine features editor. They moved here from [facility] Oct 2012.   Medications:   I have reviewed the patient's current medications.  Current Outpatient Prescriptions  Medication Sig Dispense Refill  . acetaminophen (TYLENOL) 500 MG tablet Take 500 mg by mouth every 6 (six) hours as needed. For pain       . albuterol (PROVENTIL HFA;VENTOLIN HFA) 108 (90 BASE) MCG/ACT inhaler Inhale 2 puffs into the lungs every 6 (six) hours as needed. For shortness of breath       . cholecalciferol (VITAMIN D) 1000 UNITS tablet Take 2,000 Units by mouth daily.        . citalopram (CELEXA) 20 MG tablet Take 20 mg by mouth daily.       . clobetasol ointment (TEMOVATE) 0.05 % Apply 1 application topically daily as needed. For psoraisis       . dexamethasone (DECADRON) 4 MG tablet Take 4 mg by mouth as directed.        . digoxin (LANOXIN) 0.25 MG tablet Take 250 mcg by mouth daily.       . fluticasone (FLONASE) 50 MCG/ACT nasal spray Place 2 sprays into the nose daily as needed. For congestion      . glucosamine-chondroitin 500-400 MG tablet Take 1 tablet by mouth daily.       . Halcinonide (HALOG) 0.1 % CREA Apply 1 application topically daily as needed. For eczema      . levothyroxine (SYNTHROID, LEVOTHROID) 50 MCG tablet Take 1 tablet (50 mcg total) by mouth daily.  30 tablet  0  . lidocaine-prilocaine (EMLA) cream Apply topically as needed.        . loperamide (IMODIUM A-D) 2 MG tablet Take 2 mg by mouth 4 (four) times daily as needed. For diarrhea       . montelukast (SINGULAIR) 10 MG tablet Take 10 mg by mouth at bedtime.        Marland Kitchen OVER THE COUNTER MEDICATION Take 1 tablet by mouth daily as needed. For gas pain      . pimecrolimus (ELIDEL) 1 % cream Apply 1 application topically 2 (two) times daily as needed. For exczema       . prochlorperazine (COMPAZINE) 10 MG tablet Take 10 mg by mouth every 6 (six) hours as needed.        Marland Kitchen PULMICORT FLEXHALER 180 MCG/ACT inhaler INHALE 2 PUFFS BY MOUTH EVERY MORNING   3 Inhaler  0  . tobramycin-dexamethasone (TOBRADEX) ophthalmic solution Place 1 drop into both eyes 2 (two) times daily as needed.        . vitamin B-12 (CYANOCOBALAMIN) 100 MCG tablet Take 2,000 mcg by mouth daily.       . cephALEXin (KEFLEX) 500 MG capsule Take 1 capsule (500 mg total) by mouth 2 (two) times daily.  14 capsule  0  . ondansetron (ZOFRAN) 8 MG tablet Take 1 tablet (8 mg total) by mouth every 12 (twelve) hours as needed for nausea.  30 tablet  2    Allergies:  Allergies  Allergen Reactions  . Aspirin     Hx of TTP  . Nsaids     Hx TTP    Physical Exam: Filed Vitals:   09/07/11 1143  BP: 145/79  Pulse: 81  Temp: 98.1 F (36.7 C)   HEENT:  Sclerae anicteric, conjunctivae pink.  Oropharynx clear.  No mucositis or candidiasis.  Nodes:  No cervical, supraclavicular, or axillary lymphadenopathy palpated.  Breast Exam:  Right breast is benign, no masses, discharge, skin changes, or nipple inversion. Port is intact in the right upper chest wall, no erythema, edema, or evidence of infection. Left breast is diffusely erythematous, but especially so on the lateral edge and in the posterior portion of the breast. The breast is warm to touch, only mildly tender to touch. There is thickness, likely the hematoma, palpated just beneath the lumpectomy incision. No obvious drainage, or nipple discharge. Lungs:  Clear to auscultation bilaterally.  No crackles, rhonchi, or wheezes.   Heart:  Regular rate and rhythm.   Abdomen:  Soft, obese, nontender.  Positive bowel sounds.  No organomegaly or masses palpated.   Musculoskeletal:  No focal spinal tenderness to palpation.  Extremities:  Benign.  No peripheral edema or cyanosis.   Skin:  Benign.   Neuro:  Nonfocal.   Lab Results: Lab Results  Component Value Date   WBC 14.1* 09/07/2011   HGB 13.6 09/07/2011   HCT 39.8 09/07/2011   MCV 94.4 09/07/2011   PLT 127* 09/07/2011   NEUTROABS 13.1* 09/07/2011     Chemistry      Component Value  Date/Time   NA 138 07/21/2011 1109   K 4.5 07/21/2011 1109   CL 98 07/21/2011 1109   CO2 30 07/21/2011 1109   BUN 11 07/21/2011 1109   CREATININE 0.67 07/21/2011 1109      Component Value Date/Time   CALCIUM 8.9 07/21/2011 1109   ALKPHOS 65 07/21/2011 1109   AST 38* 07/21/2011 1109   ALT 40* 07/21/2011 1109   BILITOT 0.9 07/21/2011 1109        Radiological Studies:  A diagnostic unilateral left digital mammogram was obtained at [facility] on 08/18/2011. This showed edema and diffuse skin thickening in the left breast in the upper outer quadrant with a large postsurgical seroma/hematoma measuring 12 x 9 x 7.6 cm. No signs of residual malignancy. A 6 month left breast diagnostic mammogram was recommended for additional followup.  2-D echocardiogram on 07/22/2011 showed an ejection fraction of 55-60%.   Assessment:  56 year old [place] woman originally from [place]   1.  Status post left lumpectomy and sentinel lymph node sampling 08/03/2011 for a T1C N0 (Stage I) invasive ductal carcinoma, high-grade, triple positive.  2.  plan is to treat in the adjuvant setting with 4 doses of docetaxel and cyclophosphamide given along with trastuzumab on a q. 3 week basis, with Neulasta on day 2 for granulocyte support.  3. Trastuzumab will be continued for total of one year  4. Patient will need radiation therapy following chemotherapy.  5. Comorbidities include a history of remote thrombotic thrombocytopenic purpura and hepatitis C.   Plan:  This case was reviewed with Dr. Darnelle Catalan who also examined and spoke with [facility] today. Unfortunately, we both agree that we will need to postpone marries first dose of chemotherapy until next week due to a likely cellulitis in the left breast. Per Dr. Darnelle Catalan, we will start her on Keflex, 500 mg by mouth twice a day for the next 7 days. I have asked her to contact us at the breast worsens, or should she begins running fevers of 100 or above. She's RD  scheduled to meet with her surgeon, Dr. Carolynne Edouard, later this week on January 11 and we'll keep that appointment.  I will plan on seeing Ashleen again in one week on January 14, and we will hope to initiate day  1 cycle 1 of docetaxel/cyclophosphamide/trastuzumab at that time. She knows to begin premedicated with the dexamethasone again next Sunday, January 13.  Amiyrah would also like to establish herself with a local internist and I have placed a referral to [facility], hopefully for an appointment in January. In the meanwhile on giving her a courtesy refill on her levothyroxine, 50 mcg daily.  She would also like a referral to our nutritionist, Zenovia Jarred, and we will place that referral as well.   This plan was reviewed with the patient, who voices understanding and agreement.  She knows to call with any changes or problems.    Robey Massmann, PA-C 09/07/2011

## 2011-09-10 ENCOUNTER — Encounter (INDEPENDENT_AMBULATORY_CARE_PROVIDER_SITE_OTHER): Payer: Managed Care, Other (non HMO) | Admitting: General Surgery

## 2011-09-11 ENCOUNTER — Ambulatory Visit (INDEPENDENT_AMBULATORY_CARE_PROVIDER_SITE_OTHER): Payer: Managed Care, Other (non HMO) | Admitting: General Surgery

## 2011-09-11 ENCOUNTER — Encounter (INDEPENDENT_AMBULATORY_CARE_PROVIDER_SITE_OTHER): Payer: Self-pay | Admitting: General Surgery

## 2011-09-11 VITALS — BP 130/92 | HR 73 | Temp 97.8°F | Ht 67.0 in | Wt 253.2 lb

## 2011-09-11 DIAGNOSIS — C50919 Malignant neoplasm of unspecified site of unspecified female breast: Secondary | ICD-10-CM

## 2011-09-11 NOTE — Progress Notes (Signed)
Subjective:     Patient ID: Tara [name], female   DOB: [birth year], 56 y.o.   MRN: [id number]  HPI The patient is a 56 year old white female who is now almost 6 weeks out from a left breast lumpectomy and negative sentinel lobe biopsy for a T1 C. N0 left breast cancer. Her postoperative course was complicated by some cellulitis that developed several days after removing her last drain. Unfortunately she did not contact us about that. She did go see Dr. Darnelle Catalan who placed her on Keflex. She has been improving gradually on the antibiotics.  Review of Systems     Objective:   Physical Exam On exam her left upper outer quadrant incision is healing nicely. She does have a bit of a seroma there. There is no cellulitis associated with the seroma. She does have some cellulitis on the medial portion of her left breast. She states that this is gradually resolving.We did prep the skin overlying the seroma cavity with Betadine infiltrated with 1% lidocaine and aspirated about 60 cc of serosanguineous fluid out. She tolerated this well.    Assessment:     Status post left breast lumpectomy and negative sentinel node biopsy    Plan:     At this point I would recommend finishing out her course of antibiotics. We will plan to see her back in the next week to recheck the seroma cavity.

## 2011-09-11 NOTE — Patient Instructions (Signed)
Call if redness worsens

## 2011-09-14 ENCOUNTER — Other Ambulatory Visit (HOSPITAL_BASED_OUTPATIENT_CLINIC_OR_DEPARTMENT_OTHER): Payer: Managed Care, Other (non HMO)

## 2011-09-14 ENCOUNTER — Ambulatory Visit: Payer: Managed Care, Other (non HMO) | Admitting: Nutrition

## 2011-09-14 ENCOUNTER — Ambulatory Visit: Payer: Managed Care, Other (non HMO)

## 2011-09-14 ENCOUNTER — Ambulatory Visit (HOSPITAL_BASED_OUTPATIENT_CLINIC_OR_DEPARTMENT_OTHER): Payer: Managed Care, Other (non HMO)

## 2011-09-14 ENCOUNTER — Ambulatory Visit: Payer: Managed Care, Other (non HMO) | Admitting: Physician Assistant

## 2011-09-14 ENCOUNTER — Encounter: Payer: Self-pay | Admitting: Physician Assistant

## 2011-09-14 VITALS — BP 116/70 | HR 70 | Temp 98.2°F | Ht 67.0 in | Wt 252.7 lb

## 2011-09-14 VITALS — BP 148/68 | HR 60 | Temp 98.0°F

## 2011-09-14 DIAGNOSIS — C50919 Malignant neoplasm of unspecified site of unspecified female breast: Secondary | ICD-10-CM

## 2011-09-14 DIAGNOSIS — Z5111 Encounter for antineoplastic chemotherapy: Secondary | ICD-10-CM

## 2011-09-14 DIAGNOSIS — E039 Hypothyroidism, unspecified: Secondary | ICD-10-CM

## 2011-09-14 LAB — CBC WITH DIFFERENTIAL/PLATELET
BASO%: 0.1 % (ref 0.0–2.0)
Basophils Absolute: 0 10*3/uL (ref 0.0–0.1)
EOS%: 0 % (ref 0.0–7.0)
Eosinophils Absolute: 0 10*3/uL (ref 0.0–0.5)
HCT: 40.5 % (ref 34.8–46.6)
HGB: 13.7 g/dL (ref 11.6–15.9)
LYMPH%: 4.4 % — ABNORMAL LOW (ref 14.0–49.7)
MCH: 31.2 pg (ref 25.1–34.0)
MCHC: 33.8 g/dL (ref 31.5–36.0)
MCV: 92.3 fL (ref 79.5–101.0)
MONO#: 0.8 10*3/uL (ref 0.1–0.9)
MONO%: 5.3 % (ref 0.0–14.0)
NEUT#: 14.2 10*3/uL — ABNORMAL HIGH (ref 1.5–6.5)
NEUT%: 90.2 % — ABNORMAL HIGH (ref 38.4–76.8)
Platelets: 166 10*3/uL (ref 145–400)
RBC: 4.39 10*6/uL (ref 3.70–5.45)
RDW: 13.6 % (ref 11.2–14.5)
WBC: 15.8 10*3/uL — ABNORMAL HIGH (ref 3.9–10.3)
lymph#: 0.7 10*3/uL — ABNORMAL LOW (ref 0.9–3.3)
nRBC: 0 % (ref 0–0)

## 2011-09-14 LAB — COMPREHENSIVE METABOLIC PANEL
ALT: 23 U/L (ref 0–35)
AST: 16 U/L (ref 0–37)
Albumin: 3.9 g/dL (ref 3.5–5.2)
Alkaline Phosphatase: 57 U/L (ref 39–117)
BUN: 13 mg/dL (ref 6–23)
CO2: 26 mEq/L (ref 19–32)
Calcium: 8.7 mg/dL (ref 8.4–10.5)
Chloride: 100 mEq/L (ref 96–112)
Creatinine, Ser: 0.65 mg/dL (ref 0.50–1.10)
Glucose, Bld: 125 mg/dL — ABNORMAL HIGH (ref 70–99)
Potassium: 3.7 mEq/L (ref 3.5–5.3)
Sodium: 137 mEq/L (ref 135–145)
Total Bilirubin: 0.6 mg/dL (ref 0.3–1.2)
Total Protein: 7.6 g/dL (ref 6.0–8.3)

## 2011-09-14 MED ORDER — DIPHENHYDRAMINE HCL 25 MG PO CAPS
25.0000 mg | ORAL_CAPSULE | Freq: Once | ORAL | Status: AC
  Administered 2011-09-14: 25 mg via ORAL

## 2011-09-14 MED ORDER — SODIUM CHLORIDE 0.9 % IV SOLN
600.0000 mg/m2 | Freq: Once | INTRAVENOUS | Status: AC
  Administered 2011-09-14: 1400 mg via INTRAVENOUS
  Filled 2011-09-14: qty 70

## 2011-09-14 MED ORDER — ACETAMINOPHEN 325 MG PO TABS
650.0000 mg | ORAL_TABLET | Freq: Once | ORAL | Status: AC
  Administered 2011-09-14: 650 mg via ORAL

## 2011-09-14 MED ORDER — HEPARIN SOD (PORK) LOCK FLUSH 100 UNIT/ML IV SOLN
500.0000 [IU] | Freq: Once | INTRAVENOUS | Status: AC | PRN
  Administered 2011-09-14: 500 [IU]
  Filled 2011-09-14: qty 5

## 2011-09-14 MED ORDER — SODIUM CHLORIDE 0.9 % IJ SOLN
10.0000 mL | INTRAMUSCULAR | Status: DC | PRN
  Administered 2011-09-14: 10 mL
  Filled 2011-09-14: qty 10

## 2011-09-14 MED ORDER — DEXAMETHASONE SODIUM PHOSPHATE 4 MG/ML IJ SOLN
20.0000 mg | Freq: Once | INTRAMUSCULAR | Status: AC
  Administered 2011-09-14: 20 mg via INTRAVENOUS

## 2011-09-14 MED ORDER — TRASTUZUMAB CHEMO INJECTION 440 MG
8.0000 mg/kg | Freq: Once | INTRAVENOUS | Status: AC
  Administered 2011-09-14: 924 mg via INTRAVENOUS
  Filled 2011-09-14: qty 44

## 2011-09-14 MED ORDER — SODIUM CHLORIDE 0.9 % IV SOLN
Freq: Once | INTRAVENOUS | Status: AC
  Administered 2011-09-14: 13:00:00 via INTRAVENOUS

## 2011-09-14 MED ORDER — DOCETAXEL CHEMO INJECTION 160 MG/16ML
75.0000 mg/m2 | Freq: Once | INTRAVENOUS | Status: AC
  Administered 2011-09-14: 170 mg via INTRAVENOUS
  Filled 2011-09-14: qty 17

## 2011-09-14 MED ORDER — DEXAMETHASONE 4 MG PO TABS: ORAL_TABLET | ORAL | Status: DC

## 2011-09-14 MED ORDER — ONDANSETRON 16 MG/50ML IVPB (CHCC)
16.0000 mg | Freq: Once | INTRAVENOUS | Status: AC
  Administered 2011-09-14: 16 mg via INTRAVENOUS

## 2011-09-14 NOTE — Progress Notes (Signed)
At 1320, VSS,denies distress.

## 2011-09-14 NOTE — Progress Notes (Signed)
At 1400, VSS,denies distress, TAXOTERE   Increased to flow at 250 mls per hour for 63 mls.

## 2011-09-14 NOTE — Progress Notes (Signed)
At 1345,  VSS,denies distress, infsuion increased to flow at 188 mls per hour for 47 mls.

## 2011-09-14 NOTE — Progress Notes (Signed)
At 1325, VSS,denies distress.

## 2011-09-14 NOTE — Progress Notes (Signed)
Initial Out-patient Nutrition Assessment  Patient Reports/ Assessment:  Patient reports needs to loose weight. Patient desires healthy eating education. Patient reports over eats often and snacks when bored.   Past Medical History: Past Medical History  Diagnosis Date  . Thrombocytopenia, primary   . Hepatitis C   . Asthma   . Arthritis   . Complication of anesthesia     difficulty waking up/dizzy/lightheaded  . Environmental allergies   . Shortness of breath   . Hypothyroidism   . TTP (thrombotic thrombocytopenic purpura)   . Blood transfusion   . Anxiety   . Dysrhythmia     irregular heartbeat, takes digoxin  . Cancer 2012    breast- left  . Chronic edema     Meds: Scheduled Meds:   Current Outpatient Prescriptions on File Prior to Visit  Medication Sig Dispense Refill  . acetaminophen (TYLENOL) 500 MG tablet Take 500 mg by mouth every 6 (six) hours as needed. For pain       . albuterol (PROVENTIL HFA;VENTOLIN HFA) 108 (90 BASE) MCG/ACT inhaler Inhale 2 puffs into the lungs every 6 (six) hours as needed. For shortness of breath       . cephALEXin (KEFLEX) 500 MG capsule Take 1 capsule (500 mg total) by mouth 2 (two) times daily.  14 capsule  0  . cephALEXin (KEFLEX) 500 MG capsule Take 500 mg by mouth 2 (two) times daily.      . cholecalciferol (VITAMIN D) 1000 UNITS tablet Take 2,000 Units by mouth daily.        . citalopram (CELEXA) 20 MG tablet Take 20 mg by mouth daily.       . clobetasol ointment (TEMOVATE) 0.05 % Apply 1 application topically daily as needed. For psoraisis       . dexamethasone (DECADRON) 4 MG tablet Take 4 mg by mouth as directed.        Marland Kitchen dexamethasone (DECADRON) 4 MG tablet Take 2 tablets two times a day the day before Taxotere. Then take 2 tabs two times a day starting the day after chemo for 3 days.  30 tablet  1  . digoxin (LANOXIN) 0.25 MG tablet Take 250 mcg by mouth daily.       . fluticasone (FLONASE) 50 MCG/ACT nasal spray Place 2 sprays  into the nose daily as needed. For congestion      . glucosamine-chondroitin 500-400 MG tablet Take 1 tablet by mouth daily.       . Halcinonide (HALOG) 0.1 % CREA Apply 1 application topically daily as needed. For eczema      . levothyroxine (SYNTHROID, LEVOTHROID) 50 MCG tablet Take 1 tablet (50 mcg total) by mouth daily.  30 tablet  0  . lidocaine-prilocaine (EMLA) cream Apply topically as needed.        . loperamide (IMODIUM A-D) 2 MG tablet Take 2 mg by mouth 4 (four) times daily as needed. For diarrhea       . montelukast (SINGULAIR) 10 MG tablet Take 10 mg by mouth at bedtime.        . ondansetron (ZOFRAN) 8 MG tablet Take 1 tablet (8 mg total) by mouth every 12 (twelve) hours as needed for nausea.  30 tablet  2  . OVER THE COUNTER MEDICATION Take 1 tablet by mouth daily as needed. For gas pain      . pimecrolimus (ELIDEL) 1 % cream Apply 1 application topically 2 (two) times daily as needed. For exczema       .  prochlorperazine (COMPAZINE) 10 MG tablet Take 10 mg by mouth every 6 (six) hours as needed.        Marland Kitchen PULMICORT FLEXHALER 180 MCG/ACT inhaler INHALE 2 PUFFS BY MOUTH EVERY MORNING  3 Inhaler  0  . tobramycin-dexamethasone (TOBRADEX) ophthalmic solution Place 1 drop into both eyes 2 (two) times daily as needed.        . vitamin B-12 (CYANOCOBALAMIN) 100 MCG tablet Take 2,000 mcg by mouth daily.        Current Facility-Administered Medications on File Prior to Visit  Medication Dose Route Frequency Provider Last Rate Last Dose  . 0.9 %  sodium chloride infusion   Intravenous Once Lowella Dell, MD 20 mL/hr at 09/14/11 1237    . acetaminophen (TYLENOL) tablet 650 mg  650 mg Oral Once Lowella Dell, MD   650 mg at 09/14/11 1331  . cyclophosphamide (CYTOXAN) 1,400 mg in sodium chloride 0.9 % 250 mL chemo infusion  600 mg/m2 (Treatment Plan Actual) Intravenous Once Lowella Dell, MD 640 mL/hr at 09/14/11 1459 1,400 mg at 09/14/11 1459  . dexamethasone (DECADRON) injection 20  mg  20 mg Intravenous Once Lowella Dell, MD   20 mg at 09/14/11 1237  . diphenhydrAMINE (BENADRYL) capsule 25 mg  25 mg Oral Once Lowella Dell, MD   25 mg at 09/14/11 1331  . DOCEtaxel (TAXOTERE) 170 mg in dextrose 5 % 250 mL chemo infusion  75 mg/m2 (Treatment Plan Actual) Intravenous Once Lowella Dell, MD 267 mL/hr at 09/14/11 1314 170 mg at 09/14/11 1314  . heparin lock flush 100 unit/mL  500 Units Intracatheter Once PRN Lowella Dell, MD      . ondansetron (ZOFRAN) IVPB 16 mg  16 mg Intravenous Once Lowella Dell, MD   16 mg at 09/14/11 1237  . sodium chloride 0.9 % injection 10 mL  10 mL Intracatheter PRN Lowella Dell, MD      . trastuzumab (HERCEPTIN) 924 mg in sodium chloride 0.9 % 250 mL chemo infusion  8 mg/kg (Treatment Plan Actual) Intravenous Once Lowella Dell, MD 196 mL/hr at 09/14/11 1543 924 mg at 09/14/11 1543     Labs:  CMP     Component Value Date/Time   NA 137 09/14/2011 1019   K 3.7 09/14/2011 1019   CL 100 09/14/2011 1019   CO2 26 09/14/2011 1019   GLUCOSE 125* 09/14/2011 1019   BUN 13 09/14/2011 1019   CREATININE 0.65 09/14/2011 1019   CALCIUM 8.7 09/14/2011 1019   PROT 7.6 09/14/2011 1019   ALBUMIN 3.9 09/14/2011 1019   AST 16 09/14/2011 1019   ALT 23 09/14/2011 1019   ALKPHOS 57 09/14/2011 1019   BILITOT 0.6 09/14/2011 1019   GFRNONAA >90 07/21/2011 1109   GFRAA >90 07/21/2011 1109    Estimated Nutrition Needs:1840-2150 kcal, 92-122 g protein, 1 ml per kcal fluid  Weight Status:  252.3 lb. (07/08/11  248.9 lb)  IBW: 135 lb  %IBW 187% Height: 67" BMI: 39.51  Nutrition Dx: Food and nutrition related knowledge deficit related to recent diagnosis of cancer, healthy eating and weight loss as evidenced by patient request for RD consult and lack of prior knowledge.  Goal:  1. Weight maintenance/ prevent further weight gain Discussed attainable goals with patient: 1. Work on decreasing portion sizes 2. Will eat plant based diet 2 days a  week 3. Will switch to light version of cranberry juice to reduce caloric beverage intake  Intervention:  Discussed and provided handouts for healthy food choices and plant based diet. Discussed meal and snack ideas.   Monitor: Food and nutrition related knowledge deficit resolved with nutrition counseling 1/14. Provided RD contact info for further questions.    Adron Bene Pager #:  [phone]

## 2011-09-14 NOTE — Progress Notes (Signed)
At 1415, VSS,denies distress, infusion   Flowing at 250 mls per hour.

## 2011-09-14 NOTE — Progress Notes (Signed)
At  1330, VSS,  Infusion increased to  Flow at 125 mls for 31 mls., denies distress.

## 2011-09-14 NOTE — Progress Notes (Signed)
Hematology and Oncology Follow Up Visit  Tara [name] [id number] [birth year] 56 y.o. 09/14/2011    HPI: Tara [name] noted a mass in her left breast and brought it to her physician's attention in [place], where she was residing until recently. She was set up for mammography 06/24/2011 at [facility] in [place], [place]. This showed a suspicious abnormality in the left breast measuring approximately 10 mm. Physical examination showed a 1 cm firm superficial mass in the axillary region of the breast and ultrasound showed this to be 11 mm and spiculated.   Biopsy was performed June 25, 2011, and showed ([id number] at the [facility]) and invasive ductal carcinoma, grade 3, estrogen receptor 95% positive, progesterone receptor 40% positive, with an MIB-1 of 29%. The tumor is HER 2 positive by FISH with a ratio of 2.5. There is tumor heterogeneity noted.    With this information the case was presented Jul 08, 2011 at the multidisciplinary breast cancer conference and ithe patient was evaluated that afternoon at the multidisciplinary breast cancer clinic. She proceeded to definitive left lumpectomy and sentinel lymph node sampling 08/03/2011. This confirmed a 1.6 cm invasive ductal carcinoma, grade 3. Margins were ample. The two sentinel lymph nodes were clear.   The decision was made to proceed with 4 cycles of adjuvant chemotherapy consisting of docetaxel/cyclophosphamide given with trastuzumab every 3 weeks with Neulasta on day 2 for granulocyte support Trastuzumab will be continued for total of one year.   Interim History:  Tara [name] returns today accompanied by her mother, due to initiate her adjuvant chemotherapy, day 1 cycle 1 of 4 planned q. three-week doses of docetaxel/cyclophosphamide/trastuzumab.  Tara [name] was seen here one week ago, at which time treatment was held to do 2 cellulitis in the left breast. She was started on Keflex, and was also evaluated by her surgeon, Dr.  Carolynne Edouard. She had fluid drained from the seroma. She has continued the course of Keflex with good tolerance, and the left breast is improving. There is still some mild redness around the medial portion of the breast, but no tenderness, pain, warmth to touch, and again it has improved. She's had no fevers, chills, or night sweats.  Otherwise, in fact, Tara [name] is feeling well with no additional complaints. She did begin premedication with oral dexamethasone yesterday as instructed. She does have some hot flashes associated with the dexamethasone.   At baseline, will mention that the patient has no signs of peripheral neuropathy in either the upper or lower extremities.  A detailed review of systems is otherwise noncontributory as noted below.  Review of Systems: Constitutional: fatigue,  no weight loss, fever, night sweats Eyes: negative [id number] Cardiovascular: no chest pain or dyspnea on exertion Respiratory: no cough, shortness of breath, or wheezing Neurological: negative Dermatological: negative Gastrointestinal: no abdominal pain, nausea, emesis, change in bowel habits, or black or bloody stools Genito-Urinary: no dysuria, trouble voiding, or hematuria Hematological and Lymphatic: negative Breast: positive for - slight redness, left breast, improved Musculoskeletal: negative Remaining ROS negative.  Family history: Patient's father died at the age of 74 from emphysema in the setting of tobacco abuse patient's mother is alive at 74 the patient has one brother and one sister there is no history of breast or ovarian cancer in the immediate family.  Gynecologic history: She is GX P0 menarche age 70 most recent period July of 2008 she never took hormone replacement   Social History: the patient is a homemaker; she used to work in an office previously.  Her husband Tara [name] is a Magazine features editor. They moved here from [facility] Oct 2012.   Medications:   I have reviewed the patient's current  medications.  Current Outpatient Prescriptions  Medication Sig Dispense Refill  . acetaminophen (TYLENOL) 500 MG tablet Take 500 mg by mouth every 6 (six) hours as needed. For pain       . albuterol (PROVENTIL HFA;VENTOLIN HFA) 108 (90 BASE) MCG/ACT inhaler Inhale 2 puffs into the lungs every 6 (six) hours as needed. For shortness of breath       . cephALEXin (KEFLEX) 500 MG capsule Take 1 capsule (500 mg total) by mouth 2 (two) times daily.  14 capsule  0  . cephALEXin (KEFLEX) 500 MG capsule Take 500 mg by mouth 2 (two) times daily.      . cholecalciferol (VITAMIN D) 1000 UNITS tablet Take 2,000 Units by mouth daily.        . citalopram (CELEXA) 20 MG tablet Take 20 mg by mouth daily.       . clobetasol ointment (TEMOVATE) 0.05 % Apply 1 application topically daily as needed. For psoraisis       . dexamethasone (DECADRON) 4 MG tablet Take 4 mg by mouth as directed.        . digoxin (LANOXIN) 0.25 MG tablet Take 250 mcg by mouth daily.       . fluticasone (FLONASE) 50 MCG/ACT nasal spray Place 2 sprays into the nose daily as needed. For congestion      . glucosamine-chondroitin 500-400 MG tablet Take 1 tablet by mouth daily.       . Halcinonide (HALOG) 0.1 % CREA Apply 1 application topically daily as needed. For eczema      . levothyroxine (SYNTHROID, LEVOTHROID) 50 MCG tablet Take 1 tablet (50 mcg total) by mouth daily.  30 tablet  0  . lidocaine-prilocaine (EMLA) cream Apply topically as needed.        . loperamide (IMODIUM A-D) 2 MG tablet Take 2 mg by mouth 4 (four) times daily as needed. For diarrhea       . montelukast (SINGULAIR) 10 MG tablet Take 10 mg by mouth at bedtime.        . ondansetron (ZOFRAN) 8 MG tablet Take 1 tablet (8 mg total) by mouth every 12 (twelve) hours as needed for nausea.  30 tablet  2  . OVER THE COUNTER MEDICATION Take 1 tablet by mouth daily as needed. For gas pain      . pimecrolimus (ELIDEL) 1 % cream Apply 1 application topically 2 (two) times daily as  needed. For exczema       . prochlorperazine (COMPAZINE) 10 MG tablet Take 10 mg by mouth every 6 (six) hours as needed.        Marland Kitchen PULMICORT FLEXHALER 180 MCG/ACT inhaler INHALE 2 PUFFS BY MOUTH EVERY MORNING  3 Inhaler  0  . tobramycin-dexamethasone (TOBRADEX) ophthalmic solution Place 1 drop into both eyes 2 (two) times daily as needed.        . vitamin B-12 (CYANOCOBALAMIN) 100 MCG tablet Take 2,000 mcg by mouth daily.         Allergies:  Allergies  Allergen Reactions  . Aspirin     Hx of TTP  . Nsaids     Hx TTP    Physical Exam: Filed Vitals:   09/14/11 1046  BP: 116/70  Pulse: 70  Temp: 98.2 F (36.8 C)   HEENT:  Sclerae anicteric, conjunctivae pink.  Oropharynx clear.  No  mucositis or candidiasis.   Nodes:  No cervical, supraclavicular, or axillary lymphadenopathy palpated.  Breast Exam:  Right breast is benign, no masses, discharge, skin changes, or nipple inversion. Port is intact in the right upper chest wall, no erythema, edema, or evidence of infection. Left breast, status post lumpectomy with some firmness palpated around the area of the incision associated with a known seroma. There is slight erythema noted in the central/medial portion of the breast, improved since last week. No warmth to touch, no pain to palpation, no nipple discharge. Lungs:  Clear to auscultation bilaterally.  No crackles, rhonchi, or wheezes.   Heart:  Regular rate and rhythm.   Abdomen:  Soft, obese, nontender.  Positive bowel sounds.  No organomegaly or masses palpated.   Musculoskeletal:  No focal spinal tenderness to palpation.  Extremities:  Benign.  No peripheral edema or cyanosis.   Skin:  Benign.   Neuro:  Nonfocal.   Lab Results: Lab Results  Component Value Date   WBC 15.8* 09/14/2011   HGB 13.7 09/14/2011   HCT 40.5 09/14/2011   MCV 92.3 09/14/2011   PLT 166 09/14/2011   NEUTROABS 14.2* 09/14/2011     Chemistry      Component Value Date/Time   NA 138 07/21/2011 1109   K 4.5  07/21/2011 1109   CL 98 07/21/2011 1109   CO2 30 07/21/2011 1109   BUN 11 07/21/2011 1109   CREATININE 0.67 07/21/2011 1109      Component Value Date/Time   CALCIUM 8.9 07/21/2011 1109   ALKPHOS 65 07/21/2011 1109   AST 38* 07/21/2011 1109   ALT 40* 07/21/2011 1109   BILITOT 0.9 07/21/2011 1109        Radiological Studies:  A diagnostic unilateral left digital mammogram was obtained at [facility] on 08/18/2011. This showed edema and diffuse skin thickening in the left breast in the upper outer quadrant with a large postsurgical seroma/hematoma measuring 12 x 9 x 7.6 cm. No signs of residual malignancy. A 6 month left breast diagnostic mammogram was recommended for additional followup.  2-D echocardiogram on 07/22/2011 showed an ejection fraction of 55-60%.   Assessment:  56 year old [place] woman originally from [place]   1.  Status post left lumpectomy and sentinel lymph node sampling 08/03/2011 for a T1C N0 (Stage I) invasive ductal carcinoma, high-grade, triple positive.  2.  plan is to treat in the adjuvant setting with 4 doses of docetaxel and cyclophosphamide given along with trastuzumab on a q. 3 week basis, with Neulasta on day 2 for granulocyte support.  3. Trastuzumab will be continued for total of one year  4. Patient will need radiation therapy following chemotherapy.  5. Comorbidities include a history of remote thrombotic thrombocytopenic purpura and hepatitis C.   Plan:  This case was reviewed with Dr. Darnelle Catalan and we are comfortable with proceeding with Jonai's first dose of chemotherapy today as scheduled. She'll complete out her course of Keflex, and is also scheduled to meet with the surgeon again later this week for reevaluation.  Carlea will return tomorrow for her Neulasta injection on day 2. I will see her next week on January 22 for assessment of chemotoxicity.  Idell would also like to establish herself with a local internist and I have placed a referral to  [facility], hopefully for an appointment in January.  This plan was reviewed with the patient, who voices understanding and agreement.  She knows to call with any changes or problems.  Quindarrius Joplin, PA-C 09/14/2011

## 2011-09-14 NOTE — Progress Notes (Signed)
At 1500, VSS, TAXOTERE infusion completed.

## 2011-09-14 NOTE — Progress Notes (Signed)
At 1315,  TAXOTERE infusion started at 63 mls per hour for 16 mls. Pt instructed to inform nurse at once if having, difficulty breathing, chest pain, or sudden chills.

## 2011-09-15 ENCOUNTER — Ambulatory Visit (HOSPITAL_BASED_OUTPATIENT_CLINIC_OR_DEPARTMENT_OTHER): Payer: Managed Care, Other (non HMO)

## 2011-09-15 ENCOUNTER — Telehealth: Payer: Self-pay | Admitting: Medical Oncology

## 2011-09-15 VITALS — BP 145/69 | HR 57 | Temp 97.7°F

## 2011-09-15 DIAGNOSIS — C50419 Malignant neoplasm of upper-outer quadrant of unspecified female breast: Secondary | ICD-10-CM

## 2011-09-15 DIAGNOSIS — C50919 Malignant neoplasm of unspecified site of unspecified female breast: Secondary | ICD-10-CM

## 2011-09-15 DIAGNOSIS — Z5189 Encounter for other specified aftercare: Secondary | ICD-10-CM

## 2011-09-15 MED ORDER — PEGFILGRASTIM INJECTION 6 MG/0.6ML
6.0000 mg | Freq: Once | SUBCUTANEOUS | Status: AC
  Administered 2011-09-15: 6 mg via SUBCUTANEOUS
  Filled 2011-09-15: qty 0.6

## 2011-09-15 NOTE — Telephone Encounter (Signed)
Spoke with Tara [name] to see how her treatment went yesterday and how is she is feeling today. She states the treatment went well but it was a long day. I explained that the first dose of herceptin is 90 minutes but from now on it will be 30. She feesl a little queasy but took her nausea med. She also came in this morning for neulasta. Liborio Nixon discussed the side effects and told her to take claritin and tyelnol. I stressed to her if any questions or concerns to call us.She voiced understanding.

## 2011-09-17 ENCOUNTER — Encounter (INDEPENDENT_AMBULATORY_CARE_PROVIDER_SITE_OTHER): Payer: Self-pay | Admitting: General Surgery

## 2011-09-17 ENCOUNTER — Ambulatory Visit (INDEPENDENT_AMBULATORY_CARE_PROVIDER_SITE_OTHER): Payer: Managed Care, Other (non HMO) | Admitting: General Surgery

## 2011-09-17 VITALS — BP 140/80 | HR 72 | Temp 98.2°F | Resp 14 | Ht 67.0 in | Wt 253.0 lb

## 2011-09-17 DIAGNOSIS — C50919 Malignant neoplasm of unspecified site of unspecified female breast: Secondary | ICD-10-CM

## 2011-09-17 NOTE — Patient Instructions (Signed)
Continue regular self exams  

## 2011-09-17 NOTE — Progress Notes (Signed)
Subjective:     Patient ID: Tara [name], female   DOB: 1956-01-13, 56 y.o.   MRN: [id number]  HPI The patient is a 56 year old white female who is now almost 2 months out from a left breast lumpectomy and negative sentinel node biopsy for a T1 C. N0 left breast cancer. Her postoperative course was complicated by a seroma. This is significantly improved. She also had some cellulitis that occurred after her drain was removed but this has also resolved. She still has a little bit of redness centered around her areola.  Review of Systems     Objective:   Physical Exam On exam her left breast and axillary incision is healing nicely. The seroma seems to be resolving. The cellulitis has definitely resolved.   Assessment:     Almost 2 months status post left breast lumpectomy and negative sentinel node biopsy    Plan:     At this point I think she seems to be doing well. I believe the redness around her area or may be a reaction to the nuclear tracer and not cellulitis. She will continue to follow with her medical oncologist and we will see her back in about 3 months.

## 2011-09-22 ENCOUNTER — Other Ambulatory Visit: Payer: Managed Care, Other (non HMO) | Admitting: Lab

## 2011-09-22 ENCOUNTER — Encounter: Payer: Self-pay | Admitting: Physician Assistant

## 2011-09-22 ENCOUNTER — Ambulatory Visit (HOSPITAL_BASED_OUTPATIENT_CLINIC_OR_DEPARTMENT_OTHER): Payer: Managed Care, Other (non HMO) | Admitting: Physician Assistant

## 2011-09-22 VITALS — BP 118/75 | HR 66 | Temp 98.2°F | Ht 67.0 in | Wt 247.8 lb

## 2011-09-22 DIAGNOSIS — C50919 Malignant neoplasm of unspecified site of unspecified female breast: Secondary | ICD-10-CM

## 2011-09-22 DIAGNOSIS — E039 Hypothyroidism, unspecified: Secondary | ICD-10-CM

## 2011-09-22 LAB — CBC WITH DIFFERENTIAL/PLATELET
BASO%: 0.2 % (ref 0.0–2.0)
Basophils Absolute: 0 10*3/uL (ref 0.0–0.1)
EOS%: 1.3 % (ref 0.0–7.0)
Eosinophils Absolute: 0.1 10*3/uL (ref 0.0–0.5)
HCT: 37.8 % (ref 34.8–46.6)
HGB: 12.7 g/dL (ref 11.6–15.9)
LYMPH%: 10.9 % — ABNORMAL LOW (ref 14.0–49.7)
MCH: 31.8 pg (ref 25.1–34.0)
MCHC: 33.6 g/dL (ref 31.5–36.0)
MCV: 94.7 fL (ref 79.5–101.0)
MONO#: 0.4 10*3/uL (ref 0.1–0.9)
MONO%: 4.1 % (ref 0.0–14.0)
NEUT#: 7.1 10*3/uL — ABNORMAL HIGH (ref 1.5–6.5)
NEUT%: 83.5 % — ABNORMAL HIGH (ref 38.4–76.8)
Platelets: 131 10*3/uL — ABNORMAL LOW (ref 145–400)
RBC: 4 10*6/uL (ref 3.70–5.45)
RDW: 13.4 % (ref 11.2–14.5)
WBC: 8.5 10*3/uL (ref 3.9–10.3)
lymph#: 0.9 10*3/uL (ref 0.9–3.3)

## 2011-09-22 LAB — COMPREHENSIVE METABOLIC PANEL
ALT: 26 U/L (ref 0–35)
AST: 28 U/L (ref 0–37)
Albumin: 4.1 g/dL (ref 3.5–5.2)
Alkaline Phosphatase: 63 U/L (ref 39–117)
BUN: 10 mg/dL (ref 6–23)
CO2: 28 mEq/L (ref 19–32)
Calcium: 8.4 mg/dL (ref 8.4–10.5)
Chloride: 101 mEq/L (ref 96–112)
Creatinine, Ser: 0.76 mg/dL (ref 0.50–1.10)
Glucose, Bld: 112 mg/dL — ABNORMAL HIGH (ref 70–99)
Potassium: 4.6 mEq/L (ref 3.5–5.3)
Sodium: 139 mEq/L (ref 135–145)
Total Bilirubin: 0.6 mg/dL (ref 0.3–1.2)
Total Protein: 6.3 g/dL (ref 6.0–8.3)

## 2011-09-22 NOTE — Progress Notes (Signed)
Hematology and Oncology Follow Up Visit  Tara [name] [id number] February 25, 1956 56 y.o. 09/22/2011    HPI: Tara [name] noted a mass in her left breast and brought it to her physician's attention in [place], where she was residing until recently. She was set up for mammography 06/24/2011 at [facility] in [place], [place]. This showed a suspicious abnormality in the left breast measuring approximately 10 mm. Physical examination showed a 1 cm firm superficial mass in the axillary region of the breast and ultrasound showed this to be 11 mm and spiculated.   Biopsy was performed June 25, 2011, and showed ([id number] at the [facility]) and invasive ductal carcinoma, grade 3, estrogen receptor 95% positive, progesterone receptor 40% positive, with an MIB-1 of 29%. The tumor is HER 2 positive by FISH with a ratio of 2.5. There is tumor heterogeneity noted.    With this information the case was presented Jul 08, 2011 at the multidisciplinary breast cancer conference and ithe patient was evaluated that afternoon at the multidisciplinary breast cancer clinic. She proceeded to definitive left lumpectomy and sentinel lymph node sampling 08/03/2011. This confirmed a 1.6 cm invasive ductal carcinoma, grade 3. Margins were ample. The two sentinel lymph nodes were clear.   The decision was made to proceed with 4 cycles of adjuvant chemotherapy consisting of docetaxel/cyclophosphamide given with trastuzumab every 3 weeks with Neulasta on day 2 for granulocyte support Trastuzumab will be continued for total of one year.   Interim History:   Tara [name] returns today for assessment chemotoxicity on day 9, cycle 1 of 4 planned q. three-week doses of docetaxel/cyclophosphamide/trastuzumab given in the adjuvant setting for left breast carcinoma. She received Neulasta on day 2 for granulocyte support.  Tara [name] is feeling "perfect" today. She did feel little tired and drained on the day following  chemotherapy last week, but feels that her energy level is improving. She had some slight queasiness the day after treatment, but no emesis. No additional nausea since that time. No change in bowel habits. No signs of peripheral neuropathy. Her mouth feels a little rough, but no ulcerations. No skin changes, no bed changes, or nail bed sensitivity. No abnormal bleeding. No fevers or chills. No excessive tearing.  Tara [name]'s previously treated cellulitis in the left breast has resolved.  A detailed review of systems is otherwise noncontributory as noted below.  Review of Systems: Constitutional: fatigue,  no weight loss, fever, night sweats Eyes: negative [id number] Cardiovascular: no chest pain or dyspnea on exertion Respiratory: no cough, shortness of breath, or wheezing Neurological: negative Dermatological: negative Gastrointestinal: no abdominal pain, nausea, emesis, change in bowel habits, or black or bloody stools Genito-Urinary: no dysuria, trouble voiding, or hematuria Hematological and Lymphatic: negative Breast:negative Musculoskeletal: negative Remaining ROS negative.  Family history: Patient's father died at the age of 9 from emphysema in the setting of tobacco abuse patient's mother is alive at 42 the patient has one brother and one sister there is no history of breast or ovarian cancer in the immediate family.  Gynecologic history: She is GX P0 menarche age 34 most recent period July of 2008 she never took hormone replacement   Social History: the patient is a homemaker; she used to work in an office previously. Her husband Tara [name] is a Magazine features editor. They moved here from [facility] Oct 2012.   Medications:   I have reviewed the patient's current medications.  Current Outpatient Prescriptions  Medication Sig Dispense Refill  . acetaminophen (TYLENOL) 500 MG tablet Take 500 mg by  mouth every 6 (six) hours as needed. For pain       . albuterol (PROVENTIL HFA;VENTOLIN HFA) 108 (90  BASE) MCG/ACT inhaler Inhale 2 puffs into the lungs every 6 (six) hours as needed. For shortness of breath       . cephALEXin (KEFLEX) 500 MG capsule Take 500 mg by mouth 2 (two) times daily.      . cholecalciferol (VITAMIN D) 1000 UNITS tablet Take 2,000 Units by mouth daily.        . citalopram (CELEXA) 20 MG tablet Take 20 mg by mouth daily.       . clobetasol ointment (TEMOVATE) 0.05 % Apply 1 application topically daily as needed. For psoraisis       . dexamethasone (DECADRON) 4 MG tablet Take 4 mg by mouth as directed.        Marland Kitchen dexamethasone (DECADRON) 4 MG tablet Take 2 tablets two times a day the day before Taxotere. Then take 2 tabs two times a day starting the day after chemo for 3 days.  30 tablet  1  . digoxin (LANOXIN) 0.25 MG tablet Take 250 mcg by mouth daily.       . fluticasone (FLONASE) 50 MCG/ACT nasal spray Place 2 sprays into the nose daily as needed. For congestion      . glucosamine-chondroitin 500-400 MG tablet Take 1 tablet by mouth daily.       . Halcinonide (HALOG) 0.1 % CREA Apply 1 application topically daily as needed. For eczema      . levothyroxine (SYNTHROID, LEVOTHROID) 50 MCG tablet Take 1 tablet (50 mcg total) by mouth daily.  30 tablet  0  . lidocaine-prilocaine (EMLA) cream Apply topically as needed.        . loperamide (IMODIUM A-D) 2 MG tablet Take 2 mg by mouth 4 (four) times daily as needed. For diarrhea       . montelukast (SINGULAIR) 10 MG tablet Take 10 mg by mouth at bedtime.        . ondansetron (ZOFRAN) 8 MG tablet Take 1 tablet (8 mg total) by mouth every 12 (twelve) hours as needed for nausea.  30 tablet  2  . OVER THE COUNTER MEDICATION Take 1 tablet by mouth daily as needed. For gas pain      . pimecrolimus (ELIDEL) 1 % cream Apply 1 application topically 2 (two) times daily as needed. For exczema       . prochlorperazine (COMPAZINE) 10 MG tablet Take 10 mg by mouth every 6 (six) hours as needed.        Marland Kitchen PULMICORT FLEXHALER 180 MCG/ACT inhaler  INHALE 2 PUFFS BY MOUTH EVERY MORNING  3 Inhaler  0  . tobramycin-dexamethasone (TOBRADEX) ophthalmic solution Place 1 drop into both eyes 2 (two) times daily as needed.        . vitamin B-12 (CYANOCOBALAMIN) 100 MCG tablet Take 2,000 mcg by mouth daily.         Allergies:  Allergies  Allergen Reactions  . Aspirin     Hx of TTP  . Nsaids     Hx TTP    Physical Exam: Filed Vitals:   09/22/11 1128  BP: 118/75  Pulse: 66  Temp: 98.2 F (36.8 C)   HEENT:  Sclerae anicteric, conjunctivae pink.  Oropharynx clear.  No mucositis or candidiasis.   Nodes:  No cervical, supraclavicular, or axillary lymphadenopathy palpated.  Breast Exam:  Right breast is benign, no masses, discharge, skin changes, or nipple inversion.  Port is intact in the right upper chest wall, no erythema, edema, or evidence of infection. Left breast, status post lumpectomy with some firmness palpated around the area of the incision associated with a known seroma. Well-healed incision. No residual erythema noted in the breast, and no additional skin changes. No warmth to touch, no pain to palpation, no nipple discharge. Lungs:  Clear to auscultation bilaterally.  No crackles, rhonchi, or wheezes.   Heart:  Regular rate and rhythm.   Abdomen:  Soft, obese, nontender.  Positive bowel sounds.  No organomegaly or masses palpated.   Musculoskeletal:  No focal spinal tenderness to palpation.  Extremities:  Benign.  No peripheral edema or cyanosis.   Skin:  Benign.   Neuro:  Nonfocal.   Lab Results: Lab Results  Component Value Date   WBC 8.5 09/22/2011   HGB 12.7 09/22/2011   HCT 37.8 09/22/2011   MCV 94.7 09/22/2011   PLT 131* 09/22/2011   NEUTROABS 7.1* 09/22/2011     Chemistry      Component Value Date/Time   NA 137 09/14/2011 1019   K 3.7 09/14/2011 1019   CL 100 09/14/2011 1019   CO2 26 09/14/2011 1019   BUN 13 09/14/2011 1019   CREATININE 0.65 09/14/2011 1019      Component Value Date/Time   CALCIUM 8.7 09/14/2011  1019   ALKPHOS 57 09/14/2011 1019   AST 16 09/14/2011 1019   ALT 23 09/14/2011 1019   BILITOT 0.6 09/14/2011 1019        Radiological Studies:  A diagnostic unilateral left digital mammogram was obtained at [facility] on 08/18/2011. This showed edema and diffuse skin thickening in the left breast in the upper outer quadrant with a large postsurgical seroma/hematoma measuring 12 x 9 x 7.6 cm. No signs of residual malignancy. A 6 month left breast diagnostic mammogram was recommended for additional followup.  2-D echocardiogram on 07/22/2011 showed an ejection fraction of 55-60%.   Assessment:  56 year old [place] woman originally from [place]   1.  Status post left lumpectomy and sentinel lymph node sampling 08/03/2011 for a T1C N0 (Stage I) invasive ductal carcinoma, high-grade, triple positive.  2.  plan is to treat in the adjuvant setting with 4 doses of docetaxel and cyclophosphamide given along with trastuzumab on a q. 3 week basis, with Neulasta on day 2 for granulocyte support.  3. Trastuzumab will be continued for total of one year  4. Patient will need radiation therapy following chemotherapy.  5. Comorbidities include a history of remote thrombotic thrombocytopenic purpura and hepatitis C.   Plan:  Ryland tolerated her first dose of chemotherapy very well. She'll return next week for labs alone, and I'll see her in 2 weeks on February 4 in anticipation of her second dose of chemotherapy.  This plan was reviewed with the patient, who voices understanding and agreement.  She knows to call with any changes or problems.    Zollie Scale, PA-C 09/22/2011

## 2011-09-28 ENCOUNTER — Ambulatory Visit (INDEPENDENT_AMBULATORY_CARE_PROVIDER_SITE_OTHER): Payer: Managed Care, Other (non HMO) | Admitting: Internal Medicine

## 2011-09-28 ENCOUNTER — Encounter: Payer: Self-pay | Admitting: Internal Medicine

## 2011-09-28 ENCOUNTER — Other Ambulatory Visit: Payer: Managed Care, Other (non HMO) | Admitting: Lab

## 2011-09-28 ENCOUNTER — Other Ambulatory Visit (INDEPENDENT_AMBULATORY_CARE_PROVIDER_SITE_OTHER): Payer: Managed Care, Other (non HMO)

## 2011-09-28 ENCOUNTER — Other Ambulatory Visit (HOSPITAL_BASED_OUTPATIENT_CLINIC_OR_DEPARTMENT_OTHER): Payer: Managed Care, Other (non HMO) | Admitting: Lab

## 2011-09-28 DIAGNOSIS — C50919 Malignant neoplasm of unspecified site of unspecified female breast: Secondary | ICD-10-CM

## 2011-09-28 DIAGNOSIS — I499 Cardiac arrhythmia, unspecified: Secondary | ICD-10-CM

## 2011-09-28 DIAGNOSIS — L719 Rosacea, unspecified: Secondary | ICD-10-CM

## 2011-09-28 DIAGNOSIS — E039 Hypothyroidism, unspecified: Secondary | ICD-10-CM

## 2011-09-28 HISTORY — DX: Rosacea, unspecified: L71.9

## 2011-09-28 HISTORY — DX: Cardiac arrhythmia, unspecified: I49.9

## 2011-09-28 LAB — CBC WITH DIFFERENTIAL/PLATELET
BASO%: 0.3 % (ref 0.0–2.0)
Basophils Absolute: 0 10*3/uL (ref 0.0–0.1)
EOS%: 0.4 % (ref 0.0–7.0)
Eosinophils Absolute: 0 10*3/uL (ref 0.0–0.5)
HCT: 36.2 % (ref 34.8–46.6)
HGB: 12.5 g/dL (ref 11.6–15.9)
LYMPH%: 9.4 % — ABNORMAL LOW (ref 14.0–49.7)
MCH: 32.6 pg (ref 25.1–34.0)
MCHC: 34.6 g/dL (ref 31.5–36.0)
MCV: 94.3 fL (ref 79.5–101.0)
MONO#: 0.4 10*3/uL (ref 0.1–0.9)
MONO%: 4.6 % (ref 0.0–14.0)
NEUT#: 7 10*3/uL — ABNORMAL HIGH (ref 1.5–6.5)
NEUT%: 85.3 % — ABNORMAL HIGH (ref 38.4–76.8)
Platelets: 86 10*3/uL — ABNORMAL LOW (ref 145–400)
RBC: 3.84 10*6/uL (ref 3.70–5.45)
RDW: 13.6 % (ref 11.2–14.5)
WBC: 8.2 10*3/uL (ref 3.9–10.3)
lymph#: 0.8 10*3/uL — ABNORMAL LOW (ref 0.9–3.3)

## 2011-09-28 LAB — COMPREHENSIVE METABOLIC PANEL
ALT: 24 U/L (ref 0–35)
AST: 23 U/L (ref 0–37)
Albumin: 3.5 g/dL (ref 3.5–5.2)
Alkaline Phosphatase: 66 U/L (ref 39–117)
BUN: 12 mg/dL (ref 6–23)
CO2: 29 mEq/L (ref 19–32)
Calcium: 8.4 mg/dL (ref 8.4–10.5)
Chloride: 101 mEq/L (ref 96–112)
Creatinine, Ser: 0.75 mg/dL (ref 0.50–1.10)
Glucose, Bld: 86 mg/dL (ref 70–99)
Potassium: 4.5 mEq/L (ref 3.5–5.3)
Sodium: 137 mEq/L (ref 135–145)
Total Bilirubin: 0.5 mg/dL (ref 0.3–1.2)
Total Protein: 7 g/dL (ref 6.0–8.3)

## 2011-09-28 LAB — LIPID PANEL
Cholesterol: 178 mg/dL (ref 0–200)
HDL: 51.3 mg/dL (ref >39.00)
LDL Cholesterol: 102 mg/dL — ABNORMAL HIGH (ref 0–99)
Total CHOL/HDL Ratio: 3
Triglycerides: 122 mg/dL (ref 0.0–149.0)
VLDL: 24.4 mg/dL (ref 0.0–40.0)

## 2011-09-28 LAB — TSH: TSH: 2.31 u[IU]/mL (ref 0.35–5.50)

## 2011-09-28 LAB — HM MAMMOGRAPHY

## 2011-09-28 MED ORDER — METRONIDAZOLE 0.75 % EX CREA: TOPICAL_CREAM | Freq: Two times a day (BID) | CUTANEOUS | Status: AC

## 2011-09-28 NOTE — Assessment & Plan Note (Signed)
I will check her TSH toady

## 2011-09-28 NOTE — Patient Instructions (Signed)
Hypothyroidism The thyroid is a large gland located in the lower front of your neck. The thyroid gland helps control metabolism. Metabolism is how your body handles food. It controls metabolism with the hormone thyroxine. When this gland is underactive (hypothyroid), it produces too little hormone.  CAUSES These include:   Absence or destruction of thyroid tissue.   Goiter due to iodine deficiency.   Goiter due to medications.   Congenital defects (since birth).   Problems with the pituitary. This causes a lack of TSH (thyroid stimulating hormone). This hormone tells the thyroid to turn out more hormone.  SYMPTOMS  Lethargy (feeling as though you have no energy)   Cold intolerance   Weight gain (in spite of normal food intake)   Dry skin   Coarse hair   Menstrual irregularity (if severe, may lead to infertility)   Slowing of thought processes  Cardiac problems are also caused by insufficient amounts of thyroid hormone. Hypothyroidism in the newborn is cretinism, and is an extreme form. It is important that this form be treated adequately and immediately or it will lead rapidly to retarded physical and mental development. DIAGNOSIS  To prove hypothyroidism, your caregiver may do blood tests and ultrasound tests. Sometimes the signs are hidden. It may be necessary for your caregiver to watch this illness with blood tests either before or after diagnosis and treatment. TREATMENT  Low levels of thyroid hormone are increased by using synthetic thyroid hormone. This is a safe, effective treatment. It usually takes about four weeks to gain the full effects of the medication. After you have the full effect of the medication, it will generally take another four weeks for problems to leave. Your caregiver may start you on low doses. If you have had heart problems the dose may be gradually increased. It is generally not an emergency to get rapidly to normal. HOME CARE INSTRUCTIONS   Take  your medications as your caregiver suggests. Let your caregiver know of any medications you are taking or start taking. Your caregiver will help you with dosage schedules.   As your condition improves, your dosage needs may increase. It will be necessary to have continuing blood tests as suggested by your caregiver.   Report all suspected medication side effects to your caregiver.  SEEK MEDICAL CARE IF: Seek medical care if you develop:  Sweating.   Tremulousness (tremors).   Anxiety.   Rapid weight loss.   Heat intolerance.   Emotional swings.   Diarrhea.   Weakness.  SEEK IMMEDIATE MEDICAL CARE IF:  You develop chest pain, an irregular heart beat (palpitations), or a rapid heart beat. MAKE SURE YOU:   Understand these instructions.   Will watch your condition.   Will get help right away if you are not doing well or get worse.  Document Released: 08/17/2005 Document Revised: 04/29/2011 Document Reviewed: 04/06/2008 [facility] Patient Information 2012 [place], [place].Rosacea Rosacea is similar to acne, with red bumps and pimples appearing around the face. The cause is unknown. It is not an infection. It is often made worse by drinking alcohol, especially red wine, or eating hot or spicy foods. Eating chocolate, nuts, or cheese may also make it worse. It can be severe in some cases and eventually result in a bright, red, swollen nose. Rosacea tends to come and go, and cannot usually be completely cured. Sometimes it remains dormant for many years, and then recurs.  Treatment can be very effective, however, in clearing the rash and preventing the problem.  Drug treatment may include topical medicine or an oral antibiotic. Normally 1 to 2 months of treatment is needed to make rosacea go away. Low dose oral antibiotics or topicals may be needed long term for prevention. Call your doctor for a follow up exam in 1 to 2 months, unless your rash worsens and you need more immediate  care. Document Released: 09/24/2004 Document Revised: 04/29/2011 Document Reviewed: 08/06/2008 [facility] Patient Information 2012 [place], [place].

## 2011-09-28 NOTE — Assessment & Plan Note (Signed)
Start metronidazole cream

## 2011-09-28 NOTE — Assessment & Plan Note (Signed)
NED today, I will check her dig level

## 2011-09-28 NOTE — Progress Notes (Signed)
Subjective:    Patient ID: Tara [name], female    DOB: 1956-08-21, 56 y.o.   MRN: [id number]  Rash This is a recurrent problem. The current episode started 1 to 4 weeks ago. The problem is unchanged. The affected locations include the face. The rash is characterized by redness. She was exposed to nothing. Pertinent negatives include no anorexia, congestion, cough, diarrhea, eye pain, facial edema, fatigue, fever, joint pain, nail changes, rhinorrhea, shortness of breath, sore throat or vomiting. Past treatments include topical steroids and oral steroids. The treatment provided mild relief. Her past medical history is significant for eczema.  Thyroid Problem Presents for follow-up visit. Symptoms include dry skin. Patient reports no anxiety, cold intolerance, constipation, depressed mood, diaphoresis, diarrhea, fatigue, hair loss, heat intolerance, hoarse voice, leg swelling, menstrual problem, nail problem, palpitations, tremors, visual change, weight gain or weight loss. The symptoms have been stable.      Review of Systems  Constitutional: Negative for fever, chills, weight loss, weight gain, diaphoresis, activity change, appetite change, fatigue and unexpected weight change.  HENT: Negative for congestion, sore throat, hoarse voice, facial swelling, rhinorrhea, trouble swallowing, neck pain, neck stiffness and voice change.   Eyes: Negative.  Negative for pain.  Respiratory: Negative for apnea, cough, choking, chest tightness, shortness of breath, wheezing and stridor.   Cardiovascular: Negative for chest pain, palpitations and leg swelling.  Gastrointestinal: Negative for nausea, vomiting, abdominal pain, diarrhea, constipation, blood in stool and anorexia.  Genitourinary: Negative for dysuria, urgency, frequency, hematuria, decreased urine volume, enuresis, difficulty urinating, menstrual problem and dyspareunia.  Musculoskeletal: Negative for back pain, joint pain, joint swelling,  arthralgias and gait problem.  Skin: Positive for rash. Negative for nail changes, color change, pallor and wound.  Neurological: Negative for dizziness, tremors, seizures, syncope, facial asymmetry, speech difficulty, weakness, light-headedness, numbness and headaches.  Hematological: Negative for cold intolerance, heat intolerance and adenopathy. Does not bruise/bleed easily.  Psychiatric/Behavioral: Negative.        Objective:   Physical Exam  Vitals reviewed. Constitutional: She is oriented to person, place, and time. She appears well-developed and well-nourished. No distress.  HENT:  Head: Normocephalic and atraumatic.  Mouth/Throat: Oropharynx is clear and moist. No oropharyngeal exudate.  Eyes: Conjunctivae are normal. Right eye exhibits no discharge. Left eye exhibits no discharge. No scleral icterus.  Neck: Normal range of motion. Neck supple. No JVD present. No tracheal deviation present. No thyromegaly present.  Cardiovascular: Normal rate, regular rhythm, normal heart sounds and intact distal pulses.  Exam reveals no gallop and no friction rub.   No murmur heard. Pulmonary/Chest: Effort normal and breath sounds normal. No stridor. No respiratory distress. She has no wheezes. She has no rales. She exhibits no tenderness.  Abdominal: Soft. Bowel sounds are normal. She exhibits no distension and no mass. There is no tenderness. There is no rebound and no guarding.  Musculoskeletal: Normal range of motion. She exhibits no edema and no tenderness.  Lymphadenopathy:    She has no cervical adenopathy.  Neurological: She is oriented to person, place, and time.  Skin: Skin is warm and dry. Rash noted. No abrasion, no petechiae and no purpura noted. Rash is papular. Rash is not macular, not nodular, not pustular, not vesicular and not urticarial. She is not diaphoretic. No erythema. No pallor.       She has scattered solitary erythematous papules across her chin that have been excoriated    Psychiatric: She has a normal mood and affect. Her behavior is normal.  Judgment and thought content normal.      Lab Results  Component Value Date   WBC 8.2 09/28/2011   HGB 12.5 09/28/2011   HCT 36.2 09/28/2011   PLT 86* 09/28/2011   GLUCOSE 86 09/28/2011   ALT 24 09/28/2011   AST 23 09/28/2011   NA 137 09/28/2011   K 4.5 09/28/2011   CL 101 09/28/2011   CREATININE 0.75 09/28/2011   BUN 12 09/28/2011   CO2 29 09/28/2011      Assessment & Plan:

## 2011-09-29 ENCOUNTER — Encounter: Payer: Self-pay | Admitting: Internal Medicine

## 2011-09-29 LAB — DIGOXIN LEVEL: Digoxin Level: 0.9 ng/mL (ref 0.8–2.0)

## 2011-10-04 ENCOUNTER — Other Ambulatory Visit: Payer: Self-pay | Admitting: Physician Assistant

## 2011-10-05 ENCOUNTER — Other Ambulatory Visit: Payer: Managed Care, Other (non HMO)

## 2011-10-05 ENCOUNTER — Encounter: Payer: Self-pay | Admitting: Physician Assistant

## 2011-10-05 ENCOUNTER — Ambulatory Visit (HOSPITAL_BASED_OUTPATIENT_CLINIC_OR_DEPARTMENT_OTHER): Payer: Managed Care, Other (non HMO) | Admitting: Physician Assistant

## 2011-10-05 ENCOUNTER — Ambulatory Visit (HOSPITAL_BASED_OUTPATIENT_CLINIC_OR_DEPARTMENT_OTHER): Payer: Managed Care, Other (non HMO)

## 2011-10-05 VITALS — BP 127/76 | HR 76 | Temp 98.4°F | Ht 67.0 in | Wt 255.5 lb

## 2011-10-05 DIAGNOSIS — E039 Hypothyroidism, unspecified: Secondary | ICD-10-CM

## 2011-10-05 DIAGNOSIS — Z5112 Encounter for antineoplastic immunotherapy: Secondary | ICD-10-CM

## 2011-10-05 DIAGNOSIS — C50919 Malignant neoplasm of unspecified site of unspecified female breast: Secondary | ICD-10-CM

## 2011-10-05 DIAGNOSIS — Z5111 Encounter for antineoplastic chemotherapy: Secondary | ICD-10-CM

## 2011-10-05 LAB — COMPREHENSIVE METABOLIC PANEL
ALT: 34 U/L (ref 0–35)
AST: 25 U/L (ref 0–37)
Albumin: 3.7 g/dL (ref 3.5–5.2)
Alkaline Phosphatase: 62 U/L (ref 39–117)
BUN: 14 mg/dL (ref 6–23)
CO2: 27 mEq/L (ref 19–32)
Calcium: 9.1 mg/dL (ref 8.4–10.5)
Chloride: 98 mEq/L (ref 96–112)
Creatinine, Ser: 0.55 mg/dL (ref 0.50–1.10)
Glucose, Bld: 176 mg/dL — ABNORMAL HIGH (ref 70–99)
Potassium: 4 mEq/L (ref 3.5–5.3)
Sodium: 137 mEq/L (ref 135–145)
Total Bilirubin: 0.5 mg/dL (ref 0.3–1.2)
Total Protein: 7.1 g/dL (ref 6.0–8.3)

## 2011-10-05 LAB — CBC WITH DIFFERENTIAL/PLATELET
BASO%: 0.1 % (ref 0.0–2.0)
Basophils Absolute: 0 10*3/uL (ref 0.0–0.1)
EOS%: 0 % (ref 0.0–7.0)
Eosinophils Absolute: 0 10*3/uL (ref 0.0–0.5)
HCT: 37.3 % (ref 34.8–46.6)
HGB: 12.5 g/dL (ref 11.6–15.9)
LYMPH%: 4.3 % — ABNORMAL LOW (ref 14.0–49.7)
MCH: 31 pg (ref 25.1–34.0)
MCHC: 33.5 g/dL (ref 31.5–36.0)
MCV: 92.6 fL (ref 79.5–101.0)
MONO#: 0.9 10*3/uL (ref 0.1–0.9)
MONO%: 5.2 % (ref 0.0–14.0)
NEUT#: 16 10*3/uL — ABNORMAL HIGH (ref 1.5–6.5)
NEUT%: 90.4 % — ABNORMAL HIGH (ref 38.4–76.8)
Platelets: 150 10*3/uL (ref 145–400)
RBC: 4.03 10*6/uL (ref 3.70–5.45)
RDW: 13.8 % (ref 11.2–14.5)
WBC: 17.7 10*3/uL — ABNORMAL HIGH (ref 3.9–10.3)
lymph#: 0.8 10*3/uL — ABNORMAL LOW (ref 0.9–3.3)

## 2011-10-05 MED ORDER — DOCETAXEL CHEMO INJECTION 160 MG/16ML
75.0000 mg/m2 | Freq: Once | INTRAVENOUS | Status: AC
  Administered 2011-10-05: 170 mg via INTRAVENOUS
  Filled 2011-10-05: qty 17

## 2011-10-05 MED ORDER — SODIUM CHLORIDE 0.9 % IV SOLN: Freq: Once | INTRAVENOUS | Status: DC

## 2011-10-05 MED ORDER — DEXAMETHASONE SODIUM PHOSPHATE 4 MG/ML IJ SOLN
20.0000 mg | Freq: Once | INTRAMUSCULAR | Status: AC
  Administered 2011-10-05: 20 mg via INTRAVENOUS

## 2011-10-05 MED ORDER — SODIUM CHLORIDE 0.9 % IV SOLN
Freq: Once | INTRAVENOUS | Status: AC
  Administered 2011-10-05: 12:00:00 via INTRAVENOUS

## 2011-10-05 MED ORDER — ONDANSETRON 16 MG/50ML IVPB (CHCC)
16.0000 mg | Freq: Once | INTRAVENOUS | Status: AC
  Administered 2011-10-05: 16 mg via INTRAVENOUS

## 2011-10-05 MED ORDER — SODIUM CHLORIDE 0.9 % IV SOLN
600.0000 mg/m2 | Freq: Once | INTRAVENOUS | Status: AC
  Administered 2011-10-05: 1400 mg via INTRAVENOUS
  Filled 2011-10-05: qty 70

## 2011-10-05 MED ORDER — HEPARIN SOD (PORK) LOCK FLUSH 100 UNIT/ML IV SOLN
500.0000 [IU] | Freq: Once | INTRAVENOUS | Status: DC | PRN
  Filled 2011-10-05: qty 5

## 2011-10-05 MED ORDER — HEPARIN SOD (PORK) LOCK FLUSH 100 UNIT/ML IV SOLN
500.0000 [IU] | Freq: Once | INTRAVENOUS | Status: AC | PRN
  Administered 2011-10-05: 500 [IU]
  Filled 2011-10-05: qty 5

## 2011-10-05 MED ORDER — ACETAMINOPHEN 325 MG PO TABS
650.0000 mg | ORAL_TABLET | Freq: Once | ORAL | Status: AC
  Administered 2011-10-05: 650 mg via ORAL

## 2011-10-05 MED ORDER — SODIUM CHLORIDE 0.9 % IJ SOLN
10.0000 mL | INTRAMUSCULAR | Status: DC | PRN
  Filled 2011-10-05: qty 10

## 2011-10-05 MED ORDER — DIPHENHYDRAMINE HCL 25 MG PO CAPS
25.0000 mg | ORAL_CAPSULE | Freq: Once | ORAL | Status: AC
  Administered 2011-10-05: 25 mg via ORAL

## 2011-10-05 MED ORDER — SODIUM CHLORIDE 0.9 % IJ SOLN
10.0000 mL | INTRAMUSCULAR | Status: DC | PRN
  Administered 2011-10-05: 10 mL
  Filled 2011-10-05: qty 10

## 2011-10-05 MED ORDER — TRASTUZUMAB CHEMO INJECTION 440 MG
6.0000 mg/kg | Freq: Once | INTRAVENOUS | Status: AC
  Administered 2011-10-05: 693 mg via INTRAVENOUS
  Filled 2011-10-05: qty 33

## 2011-10-05 NOTE — Progress Notes (Signed)
Hematology and Oncology Follow Up Visit  Tara [name] [id number] 1956/03/15 56 y.o. 10/05/2011    HPI: Ayva noted a mass in her left breast and brought it to her physician's attention in [place], where she was residing until recently. She was set up for mammography 06/24/2011 at [facility] in [place], [place]. This showed a suspicious abnormality in the left breast measuring approximately 10 mm. Physical examination showed a 1 cm firm superficial mass in the axillary region of the breast and ultrasound showed this to be 11 mm and spiculated.   Biopsy was performed June 25, 2011, and showed ([id number] at the [facility]) and invasive ductal carcinoma, grade 3, estrogen receptor 95% positive, progesterone receptor 40% positive, with an MIB-1 of 29%. The tumor is HER 2 positive by FISH with a ratio of 2.5. There is tumor heterogeneity noted.    With this information the case was presented Jul 08, 2011 at the multidisciplinary breast cancer conference and ithe patient was evaluated that afternoon at the multidisciplinary breast cancer clinic. She proceeded to definitive left lumpectomy and sentinel lymph node sampling 08/03/2011. This confirmed a 1.6 cm invasive ductal carcinoma, grade 3. Margins were ample. The two sentinel lymph nodes were clear.   The decision was made to proceed with 4 cycles of adjuvant chemotherapy consisting of docetaxel/cyclophosphamide given with trastuzumab every 3 weeks with Neulasta on day 2 for granulocyte support Trastuzumab will be continued for total of one year.   Interim History:   Eimi returns today for followup, in anticipation of day 1, cycle 2 of 4 planned q. three-week doses of docetaxel/cyclophosphamide/trastuzumab given in the adjuvant setting for left breast carcinoma. She receives Neulasta on day 2 for granulocyte support.  Aimie is still feeling very well. She's really had very few complaints since her first cycle 3 weeks  ago. She is a little tired. Her energy level has improved since her visit here 2 weeks ago. She had no additional problems with nausea or emesis no change in bowel habits. She denies any signs of numbness or tingling in either the upper or lower extremities. She has some mild taste aversion, but no oral sensitivity or oral ulcerations. No skin changes. No signs of abnormal bleeding other than some slight blood in the mucus when she blows her nose. No excessive tearing. No fevers or chills.   Jakera's previously treated cellulitis in the left breast has resolved.  A detailed review of systems is otherwise noncontributory as noted below.  Review of Systems: Constitutional: mild fatigue,  no weight loss, fever, night sweats Eyes: negative [id number] Cardiovascular: no chest pain or dyspnea on exertion Respiratory: no cough, shortness of breath, or wheezing Neurological: negative Dermatological: negative Gastrointestinal: no abdominal pain, nausea, emesis, change in bowel habits, or black or bloody stools Genito-Urinary: no dysuria, trouble voiding, or hematuria Hematological and Lymphatic: negative Breast:negative Musculoskeletal: negative Remaining ROS negative.  Family history: Patient's father died at the age of 49 from emphysema in the setting of tobacco abuse patient's mother is alive at 12 the patient has one brother and one sister there is no history of breast or ovarian cancer in the immediate family.  Gynecologic history: She is GX P0 menarche age 35 most recent period July of 2008 she never took hormone replacement   Social History: the patient is a homemaker; she used to work in an office previously. Her husband [name] is a Magazine features editor. They moved here from [place] Oct 2012.   Medications:   I have  reviewed the patient's current medications.  Current Outpatient Prescriptions  Medication Sig Dispense Refill  . acetaminophen (TYLENOL) 500 MG tablet Take 500 mg by mouth every 6 (six)  hours as needed. For pain       . albuterol (PROVENTIL HFA;VENTOLIN HFA) 108 (90 BASE) MCG/ACT inhaler Inhale 2 puffs into the lungs every 6 (six) hours as needed. For shortness of breath       . cephALEXin (KEFLEX) 500 MG capsule Take 500 mg by mouth 2 (two) times daily.      . cholecalciferol (VITAMIN D) 1000 UNITS tablet Take 2,000 Units by mouth daily.        . citalopram (CELEXA) 20 MG tablet Take 20 mg by mouth daily.       . clobetasol ointment (TEMOVATE) 0.05 % Apply 1 application topically daily as needed. For psoraisis       . dexamethasone (DECADRON) 4 MG tablet Take 4 mg by mouth as directed.        Marland Kitchen dexamethasone (DECADRON) 4 MG tablet Take 2 tablets two times a day the day before Taxotere. Then take 2 tabs two times a day starting the day after chemo for 3 days.  30 tablet  1  . digoxin (LANOXIN) 0.25 MG tablet Take 250 mcg by mouth daily.       . fluticasone (FLONASE) 50 MCG/ACT nasal spray Place 2 sprays into the nose daily as needed. For congestion      . glucosamine-chondroitin 500-400 MG tablet Take 1 tablet by mouth daily.       . Halcinonide (HALOG) 0.1 % CREA Apply 1 application topically daily as needed. For eczema      . levothyroxine (SYNTHROID, LEVOTHROID) 50 MCG tablet TAKE 1 TABLET BY MOUTH EVERY DAY  30 tablet  0  . lidocaine-prilocaine (EMLA) cream Apply topically as needed.        . loperamide (IMODIUM A-D) 2 MG tablet Take 2 mg by mouth 4 (four) times daily as needed. For diarrhea       . metroNIDAZOLE (METROCREAM) 0.75 % cream Apply topically 2 (two) times daily.  45 g  2  . montelukast (SINGULAIR) 10 MG tablet Take 10 mg by mouth at bedtime.        . ondansetron (ZOFRAN) 8 MG tablet Take 1 tablet (8 mg total) by mouth every 12 (twelve) hours as needed for nausea.  30 tablet  2  . OVER THE COUNTER MEDICATION Take 1 tablet by mouth daily as needed. For gas pain      . pimecrolimus (ELIDEL) 1 % cream Apply 1 application topically 2 (two) times daily as needed. For  exczema       . prochlorperazine (COMPAZINE) 10 MG tablet Take 10 mg by mouth every 6 (six) hours as needed.        Marland Kitchen PULMICORT FLEXHALER 180 MCG/ACT inhaler INHALE 2 PUFFS BY MOUTH EVERY MORNING  3 Inhaler  0  . tobramycin-dexamethasone (TOBRADEX) ophthalmic solution Place 1 drop into both eyes 2 (two) times daily as needed.        . vitamin B-12 (CYANOCOBALAMIN) 100 MCG tablet Take 2,000 mcg by mouth daily.        No current facility-administered medications for this visit.   Facility-Administered Medications Ordered in Other Visits  Medication Dose Route Frequency Provider Last Rate Last Dose  . 0.9 %  sodium chloride infusion   Intravenous Once Zollie Scale, PA 20 mL/hr at 10/05/11 1214    . 0.9 %  sodium chloride infusion   Intravenous Once Zollie Scale, PA      . acetaminophen (TYLENOL) tablet 650 mg  650 mg Oral Once Zollie Scale, PA      . cyclophosphamide (CYTOXAN) 1,400 mg in sodium chloride 0.9 % 250 mL chemo infusion  600 mg/m2 (Treatment Plan Actual) Intravenous Once Camari Wisham, PA      . dexamethasone (DECADRON) injection 20 mg  20 mg Intravenous Once Zollie Scale, PA   20 mg at 10/05/11 1214  . diphenhydrAMINE (BENADRYL) capsule 25 mg  25 mg Oral Once Zollie Scale, PA      . DOCEtaxel (TAXOTERE) 170 mg in dextrose 5 % 250 mL chemo infusion  75 mg/m2 (Treatment Plan Actual) Intravenous Once Zollie Scale, PA 267 mL/hr at 10/05/11 1305 170 mg at 10/05/11 1305  . heparin lock flush 100 unit/mL  500 Units Intracatheter Once PRN Josephanthony Tindel, PA      . heparin lock flush 100 unit/mL  500 Units Intracatheter Once PRN Zollie Scale, PA      . ondansetron (ZOFRAN) IVPB 16 mg  16 mg Intravenous Once Kwesi Sangha, PA   16 mg at 10/05/11 1214  . sodium chloride 0.9 % injection 10 mL  10 mL Intracatheter PRN Andrw Mcguirt, PA      . sodium chloride 0.9 % injection 10 mL  10 mL Intracatheter PRN Lujuana Kapler, PA      . trastuzumab (HERCEPTIN) 693 mg in sodium chloride 0.9 % 250 mL chemo infusion  6 mg/kg (Treatment Plan Actual)  Intravenous Once Zollie Scale, PA        Allergies:  Allergies  Allergen Reactions  . Aspirin     Hx of TTP  . Nsaids     Hx TTP    Physical Exam: Filed Vitals:   10/05/11 1020  BP: 127/76  Pulse: 76  Temp: 98.4 F (36.9 C)   HEENT:  Sclerae anicteric, conjunctivae pink.  Oropharynx clear.  No mucositis or candidiasis.   Nodes:  No cervical, supraclavicular, or axillary lymphadenopathy palpated.  Breast Exam:  Deferred  Lungs:  Clear to auscultation bilaterally.  No crackles, rhonchi, or wheezes.   Heart:  Regular rate and rhythm.   Abdomen:  Soft, obese, nontender.  Positive bowel sounds.  No organomegaly or masses palpated.   Musculoskeletal:  No focal spinal tenderness to palpation.  Extremities:  Benign.  No peripheral edema or cyanosis.   Skin:  Benign.   Neuro:  Nonfocal.   Lab Results: Lab Results  Component Value Date   WBC 17.7* 10/05/2011   HGB 12.5 10/05/2011   HCT 37.3 10/05/2011   MCV 92.6 10/05/2011   PLT 150 10/05/2011   NEUTROABS 16.0* 10/05/2011     Chemistry      Component Value Date/Time   NA 137 09/28/2011 0952   K 4.5 09/28/2011 0952   CL 101 09/28/2011 0952   CO2 29 09/28/2011 0952   BUN 12 09/28/2011 0952   CREATININE 0.75 09/28/2011 0952      Component Value Date/Time   CALCIUM 8.4 09/28/2011 0952   ALKPHOS 66 09/28/2011 0952   AST 23 09/28/2011 0952   ALT 24 09/28/2011 0952   BILITOT 0.5 09/28/2011 1610        Radiological Studies:  A diagnostic unilateral left digital mammogram was obtained at [facility] on 08/18/2011. This showed edema and diffuse skin thickening in the left breast in the upper outer quadrant with a large postsurgical seroma/hematoma measuring 12 x 9 x  7.6 cm. No signs of residual malignancy. A 6 month left breast diagnostic mammogram was recommended for additional followup.  2-D echocardiogram on 07/22/2011 showed an ejection fraction of 55-60%.   Assessment:  56 year old [place] woman originally from [place]   1.  Status post  left lumpectomy and sentinel lymph node sampling 08/03/2011 for a T1C N0 (Stage I) invasive ductal carcinoma, high-grade, triple positive.  2.  plan is to treat in the adjuvant setting with 4 doses of docetaxel and cyclophosphamide given along with trastuzumab on a q. 3 week basis, with Neulasta on day 2 for granulocyte support.  3. Trastuzumab will be continued for total of one year  4. Patient will need radiation therapy following chemotherapy.  5. Comorbidities include a history of remote thrombotic thrombocytopenic purpura and hepatitis C.   Plan:  We will proceed to treatment today as scheduled for day 1 cycle 2 of docetaxel/cyclophosphamide/trastuzumab. She'll return tomorrow for her Neulasta injection, and we'll see Dr. Darnelle Catalan next week on February 11 for assessment chemotoxicity.   This plan was reviewed with the patient, who voices understanding and agreement.  She knows to call with any changes or problems.    Renzo Vincelette, PA-C 10/05/2011

## 2011-10-06 ENCOUNTER — Ambulatory Visit (HOSPITAL_BASED_OUTPATIENT_CLINIC_OR_DEPARTMENT_OTHER): Payer: Managed Care, Other (non HMO)

## 2011-10-06 VITALS — BP 131/82 | HR 82 | Temp 97.8°F

## 2011-10-06 DIAGNOSIS — C50919 Malignant neoplasm of unspecified site of unspecified female breast: Secondary | ICD-10-CM

## 2011-10-06 DIAGNOSIS — Z5189 Encounter for other specified aftercare: Secondary | ICD-10-CM

## 2011-10-06 MED ORDER — PEGFILGRASTIM INJECTION 6 MG/0.6ML
6.0000 mg | Freq: Once | SUBCUTANEOUS | Status: AC
  Administered 2011-10-06: 6 mg via SUBCUTANEOUS
  Filled 2011-10-06: qty 0.6

## 2011-10-12 ENCOUNTER — Other Ambulatory Visit: Payer: Managed Care, Other (non HMO) | Admitting: Lab

## 2011-10-12 ENCOUNTER — Ambulatory Visit (HOSPITAL_BASED_OUTPATIENT_CLINIC_OR_DEPARTMENT_OTHER): Payer: Managed Care, Other (non HMO) | Admitting: Oncology

## 2011-10-12 VITALS — BP 112/71 | HR 91 | Temp 98.7°F | Ht 67.0 in | Wt 249.9 lb

## 2011-10-12 DIAGNOSIS — E039 Hypothyroidism, unspecified: Secondary | ICD-10-CM

## 2011-10-12 DIAGNOSIS — C50919 Malignant neoplasm of unspecified site of unspecified female breast: Secondary | ICD-10-CM

## 2011-10-12 LAB — CBC WITH DIFFERENTIAL/PLATELET
BASO%: 0.4 % (ref 0.0–2.0)
Basophils Absolute: 0 10*3/uL (ref 0.0–0.1)
EOS%: 4.3 % (ref 0.0–7.0)
Eosinophils Absolute: 0.2 10*3/uL (ref 0.0–0.5)
HCT: 34.3 % — ABNORMAL LOW (ref 34.8–46.6)
HGB: 11.6 g/dL (ref 11.6–15.9)
LYMPH%: 16.1 % (ref 14.0–49.7)
MCH: 31.9 pg (ref 25.1–34.0)
MCHC: 33.9 g/dL (ref 31.5–36.0)
MCV: 94.1 fL (ref 79.5–101.0)
MONO#: 0.2 10*3/uL (ref 0.1–0.9)
MONO%: 5.6 % (ref 0.0–14.0)
NEUT#: 2.8 10*3/uL (ref 1.5–6.5)
NEUT%: 73.6 % (ref 38.4–76.8)
Platelets: 148 10*3/uL (ref 145–400)
RBC: 3.65 10*6/uL — ABNORMAL LOW (ref 3.70–5.45)
RDW: 13.9 % (ref 11.2–14.5)
WBC: 3.8 10*3/uL — ABNORMAL LOW (ref 3.9–10.3)
lymph#: 0.6 10*3/uL — ABNORMAL LOW (ref 0.9–3.3)

## 2011-10-12 LAB — COMPREHENSIVE METABOLIC PANEL
ALT: 36 U/L — ABNORMAL HIGH (ref 0–35)
AST: 25 U/L (ref 0–37)
Albumin: 3.2 g/dL — ABNORMAL LOW (ref 3.5–5.2)
Alkaline Phosphatase: 75 U/L (ref 39–117)
BUN: 8 mg/dL (ref 6–23)
CO2: 29 mEq/L (ref 19–32)
Calcium: 8.7 mg/dL (ref 8.4–10.5)
Chloride: 98 mEq/L (ref 96–112)
Creatinine, Ser: 0.71 mg/dL (ref 0.50–1.10)
Glucose, Bld: 125 mg/dL — ABNORMAL HIGH (ref 70–99)
Potassium: 3.7 mEq/L (ref 3.5–5.3)
Sodium: 136 mEq/L (ref 135–145)
Total Bilirubin: 0.7 mg/dL (ref 0.3–1.2)
Total Protein: 6.6 g/dL (ref 6.0–8.3)

## 2011-10-12 NOTE — Progress Notes (Signed)
Hematology and Oncology Follow Up Visit  Tara [name] [id number] [birth year] 56 y.o. 10/12/2011    HPI: Tara [name] noted a mass in her left breast and brought it to her physician's attention in [place], where she was residing until recently. She was set up for mammography 06/24/2011 at [facility] in [place], [place]. This showed a suspicious abnormality in the left breast measuring approximately 10 mm. Physical examination showed a 1 cm firm superficial mass in the axillary region of the breast and ultrasound showed this to be 11 mm and spiculated.   Biopsy was performed June 25, 2011, and showed ([id number] at the [facility]) and invasive ductal carcinoma, grade 3, estrogen receptor 95% positive, progesterone receptor 40% positive, with an MIB-1 of 29%. The tumor is HER 2 positive by FISH with a ratio of 2.5. There is tumor heterogeneity noted.    With this information the case was presented Jul 08, 2011 at the multidisciplinary breast cancer conference and ithe patient was evaluated that afternoon at the multidisciplinary breast cancer clinic. She proceeded to definitive left lumpectomy and sentinel lymph node sampling 08/03/2011. This confirmed a 1.6 cm invasive ductal carcinoma, grade 3. Margins were ample. The two sentinel lymph nodes were clear.   The decision was made to proceed with 4 cycles of adjuvant chemotherapy consisting of docetaxel/cyclophosphamide given with trastuzumab every 3 weeks with Neulasta on day 2 for granulocyte support Trastuzumab will be continued for total of one year.   Interim History:   Tara [name] returns today for followup of her breast cancer. This is day 8 cycle 2 of her trastuzumab/docetaxel/cyclophosphamide chemotherapy. Overall she is tolerating treatment well. She did have mild nausea and then vomiting on day 4, and then again on day 5, Friday, after taking some vitamin pills. On Friday she didn't have nausea was just sudden vomiting  after taking the pills. She has not been taking the nausea medication as prescribed, "waiting to see whether I need them". This is discussed further below.  Review of Systems: Otherwise she has been having mild epistaxis every morning. This happens even if she doesn't below her nose. This is minimal. It concerns her because of her history of TTP. She is not using any nasal sprays or other nasal treatments and as she is careful not to pick her. Aside from this a separately scanned detailed review of systems was entirely negative. She did lose her hair of course. She is not exercising on a regular basis at this point.  Family history: Patient's father died at the age of 74 from emphysema in the setting of tobacco abuse patient's mother is alive at 58 the patient has one brother and one sister there is no history of breast or ovarian cancer in the immediate family.  Gynecologic history: She is GX P0 menarche age 5 most recent period July of 2008 she never took hormone replacement   Social History: the patient is a homemaker; she used to work in an office previously. Her husband Tara [name] is a Magazine features editor. They moved here from [facility] Oct 2012.  Medications:   I have reviewed the patient's current medications.  Current Outpatient Prescriptions  Medication Sig Dispense Refill  . acetaminophen (TYLENOL) 500 MG tablet Take 500 mg by mouth every 6 (six) hours as needed. For pain       . albuterol (PROVENTIL HFA;VENTOLIN HFA) 108 (90 BASE) MCG/ACT inhaler Inhale 2 puffs into the lungs every 6 (six) hours as needed. For shortness of breath       .  cholecalciferol (VITAMIN D) 1000 UNITS tablet Take 2,000 Units by mouth daily.        . citalopram (CELEXA) 20 MG tablet Take 20 mg by mouth daily.       . clobetasol ointment (TEMOVATE) 0.05 % Apply 1 application topically daily as needed. For psoraisis       . dexamethasone (DECADRON) 4 MG tablet Take 4 mg by mouth as directed.        Marland Kitchen dexamethasone (DECADRON) 4 MG  tablet Take 2 tablets two times a day the day before Taxotere. Then take 2 tabs two times a day starting the day after chemo for 3 days.  30 tablet  1  . digoxin (LANOXIN) 0.25 MG tablet Take 250 mcg by mouth daily.       . fluticasone (FLONASE) 50 MCG/ACT nasal spray Place 2 sprays into the nose daily as needed. For congestion      . glucosamine-chondroitin 500-400 MG tablet Take 1 tablet by mouth daily.       . Halcinonide (HALOG) 0.1 % CREA Apply 1 application topically daily as needed. For eczema      . levothyroxine (SYNTHROID, LEVOTHROID) 50 MCG tablet TAKE 1 TABLET BY MOUTH EVERY DAY  30 tablet  0  . lidocaine-prilocaine (EMLA) cream Apply topically as needed.        . loperamide (IMODIUM A-D) 2 MG tablet Take 2 mg by mouth 4 (four) times daily as needed. For diarrhea       . metroNIDAZOLE (METROCREAM) 0.75 % cream Apply topically 2 (two) times daily.  45 g  2  . montelukast (SINGULAIR) 10 MG tablet Take 10 mg by mouth at bedtime.        . ondansetron (ZOFRAN) 8 MG tablet Take 1 tablet (8 mg total) by mouth every 12 (twelve) hours as needed for nausea.  30 tablet  2  . OVER THE COUNTER MEDICATION Take 1 tablet by mouth daily as needed. For gas pain      . pimecrolimus (ELIDEL) 1 % cream Apply 1 application topically 2 (two) times daily as needed. For exczema       . prochlorperazine (COMPAZINE) 10 MG tablet Take 10 mg by mouth every 6 (six) hours as needed.        Marland Kitchen PULMICORT FLEXHALER 180 MCG/ACT inhaler INHALE 2 PUFFS BY MOUTH EVERY MORNING  3 Inhaler  0  . tobramycin-dexamethasone (TOBRADEX) ophthalmic solution Place 1 drop into both eyes 2 (two) times daily as needed.        . vitamin B-12 (CYANOCOBALAMIN) 100 MCG tablet Take 2,000 mcg by mouth daily.       . cephALEXin (KEFLEX) 500 MG capsule Take 500 mg by mouth 2 (two) times daily.        Allergies:  Allergies  Allergen Reactions  . Aspirin     Hx of TTP  . Nsaids     Hx TTP    Physical Exam: Filed Vitals:   10/12/11 1011   BP: 112/71  Pulse: 91  Temp: 98.7 F (37.1 C)   HEENT:  Sclerae anicteric, conjunctivae pink.  Oropharynx clear.  No obvious nasal membrane abnormality  Nodes:  No cervical, supraclavicular, or axillary lymphadenopathy palpated.  Breast Exam:  Right breast no suspicious findings. Left breast status post lumpectomy. No evidence of local recurrence Lungs:  Clear to auscultation bilaterally.  No crackles, rhonchi, or wheezes.   Heart:  Regular rate and rhythm.   Abdomen:  Soft, obese, nontender.  Positive  bowel sounds.  No organomegaly or masses palpated.   Musculoskeletal:  Scoliosis, but no focal spinal tenderness to palpation.  Extremities:  Benign.  No peripheral edema or cyanosis.   Skin:  Benign.   Neuro:  Nonfocal.   Lab Results: Lab Results  Component Value Date   WBC 3.8* 10/12/2011   HGB 11.6 10/12/2011   HCT 34.3* 10/12/2011   MCV 94.1 10/12/2011   PLT 148 10/12/2011   NEUTROABS 2.8 10/12/2011     Chemistry      Component Value Date/Time   NA 137 10/05/2011 0956   K 4.0 10/05/2011 0956   CL 98 10/05/2011 0956   CO2 27 10/05/2011 0956   BUN 14 10/05/2011 0956   CREATININE 0.55 10/05/2011 0956      Component Value Date/Time   CALCIUM 9.1 10/05/2011 0956   ALKPHOS 62 10/05/2011 0956   AST 25 10/05/2011 0956   ALT 34 10/05/2011 0956   BILITOT 0.5 10/05/2011 0956        Radiological Studies:  2-D echocardiogram on 07/22/2011 showed an ejection fraction of 55-60%.  Assessment:  56 year old [place] woman originally from [place]   1.  Status post left lumpectomy and sentinel lymph node sampling 08/03/2011 for a T1C N0 (Stage I) invasive ductal carcinoma, high-grade, triple positive.  2.  Being treated in the adjuvant setting with 4 plan doses of docetaxel and cyclophosphamide given along with trastuzumab on a q. 3 week basis, with Neulasta on day 2 for granulocyte support.  3. Trastuzumab to be continued for total of one year  4. Patient will need radiation therapy following  chemotherapy.  5. Comorbidities include a history of remote thrombotic thrombocytopenic purpura and hepatitis C.   Plan:  Overall she is doing well with her treatment. I gave her a "map" of how to take her nausea medication, and I think if she takes her dexamethasone and prochlorperazine as prescribed she will not have any further vomiting issues. As far as the epistaxis is concerned I have suggested a saline nasal rinses frequently, and daily afrin or otrivin for 3 days. She will let us know if that works for her.  She will be due for repeat echocardiogram later this month. We will operationalize that. Otherwise she will receive her third of 4 planned cycles of chemotherapy in 2 weeks  Holleigh Crihfield C, MD 10/12/2011

## 2011-10-13 ENCOUNTER — Other Ambulatory Visit (HOSPITAL_COMMUNITY): Payer: Self-pay | Admitting: Radiology

## 2011-10-13 ENCOUNTER — Ambulatory Visit (HOSPITAL_COMMUNITY): Payer: Managed Care, Other (non HMO) | Attending: Cardiology | Admitting: Radiology

## 2011-10-13 ENCOUNTER — Other Ambulatory Visit: Payer: Self-pay

## 2011-10-13 DIAGNOSIS — I428 Other cardiomyopathies: Secondary | ICD-10-CM

## 2011-10-13 DIAGNOSIS — C50919 Malignant neoplasm of unspecified site of unspecified female breast: Secondary | ICD-10-CM

## 2011-10-13 DIAGNOSIS — Z79899 Other long term (current) drug therapy: Secondary | ICD-10-CM | POA: Insufficient documentation

## 2011-10-22 ENCOUNTER — Telehealth: Payer: Self-pay | Admitting: *Deleted

## 2011-10-22 NOTE — Telephone Encounter (Signed)
This RN spoke with pt per her call and concerns.  Per discussion noted symptoms are probable to be related to "buzzing" head and irritated hair follicles.  Pt did state concern irritation was an allergic reaction to medication.  Per description and onset post shaving head this RN informed pt more likely a folliculitis. Rash is located to scalp and pt is not SOB.  Plan is for pt to use OTC creams including hydrocortisone, benedryl and aloe vera for relief.

## 2011-10-22 NOTE — Telephone Encounter (Signed)
PT. HAD HER HEAD "BUZZED" ON Saturday. ON Sunday SHE NOTICED RED BUMPS ON HER HEAD. NOW HER HEAD IS ITCHING. THIS NOTE TO DR.MAGRINAT'S NURSE, VAL DODD,RN.

## 2011-10-23 NOTE — Telephone Encounter (Signed)
XXXX 

## 2011-10-26 ENCOUNTER — Ambulatory Visit (HOSPITAL_BASED_OUTPATIENT_CLINIC_OR_DEPARTMENT_OTHER): Payer: Managed Care, Other (non HMO) | Admitting: Physician Assistant

## 2011-10-26 ENCOUNTER — Other Ambulatory Visit (HOSPITAL_BASED_OUTPATIENT_CLINIC_OR_DEPARTMENT_OTHER): Payer: Managed Care, Other (non HMO) | Admitting: Lab

## 2011-10-26 ENCOUNTER — Telehealth: Payer: Self-pay | Admitting: Oncology

## 2011-10-26 ENCOUNTER — Ambulatory Visit (HOSPITAL_BASED_OUTPATIENT_CLINIC_OR_DEPARTMENT_OTHER): Payer: Managed Care, Other (non HMO)

## 2011-10-26 ENCOUNTER — Encounter: Payer: Self-pay | Admitting: Physician Assistant

## 2011-10-26 VITALS — BP 119/69 | HR 66 | Temp 98.5°F | Ht 67.0 in | Wt 254.7 lb

## 2011-10-26 DIAGNOSIS — G609 Hereditary and idiopathic neuropathy, unspecified: Secondary | ICD-10-CM

## 2011-10-26 DIAGNOSIS — C50919 Malignant neoplasm of unspecified site of unspecified female breast: Secondary | ICD-10-CM

## 2011-10-26 DIAGNOSIS — Z5111 Encounter for antineoplastic chemotherapy: Secondary | ICD-10-CM

## 2011-10-26 DIAGNOSIS — Z170 Estrogen receptor positive status [ER+]: Secondary | ICD-10-CM

## 2011-10-26 LAB — CBC WITH DIFFERENTIAL/PLATELET
BASO%: 0.1 % (ref 0.0–2.0)
Basophils Absolute: 0 10*3/uL (ref 0.0–0.1)
EOS%: 0 % (ref 0.0–7.0)
Eosinophils Absolute: 0 10*3/uL (ref 0.0–0.5)
HCT: 34.9 % (ref 34.8–46.6)
HGB: 11.7 g/dL (ref 11.6–15.9)
LYMPH%: 4.2 % — ABNORMAL LOW (ref 14.0–49.7)
MCH: 31.5 pg (ref 25.1–34.0)
MCHC: 33.5 g/dL (ref 31.5–36.0)
MCV: 94.1 fL (ref 79.5–101.0)
MONO#: 1.3 10*3/uL — ABNORMAL HIGH (ref 0.1–0.9)
MONO%: 8.9 % (ref 0.0–14.0)
NEUT#: 12.6 10*3/uL — ABNORMAL HIGH (ref 1.5–6.5)
NEUT%: 86.8 % — ABNORMAL HIGH (ref 38.4–76.8)
Platelets: 162 10*3/uL (ref 145–400)
RBC: 3.71 10*6/uL (ref 3.70–5.45)
RDW: 15.2 % — ABNORMAL HIGH (ref 11.2–14.5)
WBC: 14.5 10*3/uL — ABNORMAL HIGH (ref 3.9–10.3)
lymph#: 0.6 10*3/uL — ABNORMAL LOW (ref 0.9–3.3)
nRBC: 0 % (ref 0–0)

## 2011-10-26 LAB — COMPREHENSIVE METABOLIC PANEL
ALT: 45 U/L — ABNORMAL HIGH (ref 0–35)
AST: 33 U/L (ref 0–37)
Albumin: 3.7 g/dL (ref 3.5–5.2)
Alkaline Phosphatase: 61 U/L (ref 39–117)
BUN: 11 mg/dL (ref 6–23)
CO2: 26 mEq/L (ref 19–32)
Calcium: 9 mg/dL (ref 8.4–10.5)
Chloride: 97 mEq/L (ref 96–112)
Creatinine, Ser: 0.59 mg/dL (ref 0.50–1.10)
Glucose, Bld: 131 mg/dL — ABNORMAL HIGH (ref 70–99)
Potassium: 4.1 mEq/L (ref 3.5–5.3)
Sodium: 134 mEq/L — ABNORMAL LOW (ref 135–145)
Total Bilirubin: 0.7 mg/dL (ref 0.3–1.2)
Total Protein: 7.2 g/dL (ref 6.0–8.3)

## 2011-10-26 MED ORDER — ONDANSETRON 16 MG/50ML IVPB (CHCC)
16.0000 mg | Freq: Once | INTRAVENOUS | Status: AC
  Administered 2011-10-26: 16 mg via INTRAVENOUS

## 2011-10-26 MED ORDER — DOCETAXEL CHEMO INJECTION 160 MG/16ML
75.0000 mg/m2 | Freq: Once | INTRAVENOUS | Status: AC
  Administered 2011-10-26: 170 mg via INTRAVENOUS
  Filled 2011-10-26: qty 17

## 2011-10-26 MED ORDER — DIPHENHYDRAMINE HCL 25 MG PO CAPS
25.0000 mg | ORAL_CAPSULE | Freq: Once | ORAL | Status: AC
  Administered 2011-10-26: 25 mg via ORAL

## 2011-10-26 MED ORDER — DEXAMETHASONE SODIUM PHOSPHATE 4 MG/ML IJ SOLN
20.0000 mg | Freq: Once | INTRAMUSCULAR | Status: AC
  Administered 2011-10-26: 20 mg via INTRAVENOUS

## 2011-10-26 MED ORDER — TRASTUZUMAB CHEMO INJECTION 440 MG
6.0000 mg/kg | Freq: Once | INTRAVENOUS | Status: AC
  Administered 2011-10-26: 693 mg via INTRAVENOUS
  Filled 2011-10-26: qty 33

## 2011-10-26 MED ORDER — ACETAMINOPHEN 325 MG PO TABS
650.0000 mg | ORAL_TABLET | Freq: Once | ORAL | Status: AC
  Administered 2011-10-26: 650 mg via ORAL

## 2011-10-26 MED ORDER — HEPARIN SOD (PORK) LOCK FLUSH 100 UNIT/ML IV SOLN
500.0000 [IU] | Freq: Once | INTRAVENOUS | Status: AC | PRN
  Administered 2011-10-26: 500 [IU]
  Filled 2011-10-26: qty 5

## 2011-10-26 MED ORDER — SODIUM CHLORIDE 0.9 % IV SOLN
600.0000 mg/m2 | Freq: Once | INTRAVENOUS | Status: AC
  Administered 2011-10-26: 1400 mg via INTRAVENOUS
  Filled 2011-10-26: qty 70

## 2011-10-26 MED ORDER — SODIUM CHLORIDE 0.9 % IJ SOLN
10.0000 mL | INTRAMUSCULAR | Status: DC | PRN
  Administered 2011-10-26: 10 mL
  Filled 2011-10-26: qty 10

## 2011-10-26 MED ORDER — SODIUM CHLORIDE 0.9 % IV SOLN
Freq: Once | INTRAVENOUS | Status: AC
  Administered 2011-10-26: 12:00:00 via INTRAVENOUS

## 2011-10-26 NOTE — Progress Notes (Signed)
Hematology and Oncology Follow Up Visit  Tara [name] [id number] [birth year] 56 y.o. 10/26/2011    HPI: Tara [name] noted a mass in her left breast and brought it to her physician's attention in [place], where she was residing until recently. She was set up for mammography 06/24/2011 at [facility] in [place], [place]. This showed a suspicious abnormality in the left breast measuring approximately 10 mm. Physical examination showed a 1 cm firm superficial mass in the axillary region of the breast and ultrasound showed this to be 11 mm and spiculated.   Biopsy was performed June 25, 2011, and showed ([id number] at the [facility]) and invasive ductal carcinoma, grade 3, estrogen receptor 95% positive, progesterone receptor 40% positive, with an MIB-1 of 29%. The tumor is HER 2 positive by FISH with a ratio of 2.5. There is tumor heterogeneity noted.    With this information the case was presented Jul 08, 2011 at the multidisciplinary breast cancer conference and ithe patient was evaluated that afternoon at the multidisciplinary breast cancer clinic. She proceeded to definitive left lumpectomy and sentinel lymph node sampling 08/03/2011. This confirmed a 1.6 cm invasive ductal carcinoma, grade 3. Margins were ample. The two sentinel lymph nodes were clear.   The decision was made to proceed with 4 cycles of adjuvant chemotherapy consisting of docetaxel/cyclophosphamide given with trastuzumab every 3 weeks with Neulasta on day 2 for granulocyte support Trastuzumab will be continued for total of one year.   Interim History:   Tara [name] returns today for followup of her left breast cancer. She is due for day 1 cycle 3 of 4 planned q. three-week doses of trastuzumab/docetaxel/cyclophosphamide given in the adjuvant setting.  Overall, Tara [name] is feeling well, although she has had some increased fatigue. She vomited after her dose of dexamethasone with breakfast yesterday, but has had  no additional vomiting whatsoever. She does not feel nauseous. She believes the neuropathy has increased slightly in her left foot. She has a history of surgery in that foot, and has had some resulting neuropathy in the past. This seems to be a little more persistent these days. She tells me it is not particularly problematic, and is still mild. She's had some mild intermittent numbness and tingling in the fingertips, comes and goes, and is not affecting any of her day-to-day activities.  Tara [name] has had no fevers, chills, or night sweats. She's had no skin changes, no bed changes, or nail bed sensitivity. She did have a rash on her scalp after "buzzing" her hair last week. She used some topical hydrocortisone cream, and the rash has resolved. No additional rashes elsewhere. No signs of abnormal bleeding. No mouth ulcers or oral sensitivity.  A detailed review of systems is otherwise noncontributory as noted below.  Review of Systems: Constitutional:  Fatigue, no weight loss, fever, night sweats Eyes: uses glasses [id number] Cardiovascular: no chest pain or dyspnea on exertion Respiratory: no cough, shortness of breath, or wheezing Neurological: positive for - numbness/tingling Dermatological: positive for rash on scalp, resolving Gastrointestinal: no abdominal pain, change in bowel habits, or black or bloody stools Genito-Urinary: no dysuria, trouble voiding, or hematuria Hematological and Lymphatic: negative Breast: negative Musculoskeletal: negative Remaining ROS negative.   Family history: Patient's father died at the age of 67 from emphysema in the setting of tobacco abuse patient's mother is alive at 35 the patient has one brother and one sister there is no history of breast or ovarian cancer in the immediate family.  Gynecologic history: She is  GX P0 menarche age 22 most recent period July of 2008 she never took hormone replacement   Social History: the patient is a homemaker; she  used to work in an office previously. Her husband Tara [name] is a Magazine features editor. They moved here from [facility] Oct 2012.  Medications:   I have reviewed the patient's current medications.  Current Outpatient Prescriptions  Medication Sig Dispense Refill  . acetaminophen (TYLENOL) 500 MG tablet Take 500 mg by mouth every 6 (six) hours as needed. For pain       . albuterol (PROVENTIL HFA;VENTOLIN HFA) 108 (90 BASE) MCG/ACT inhaler Inhale 2 puffs into the lungs every 6 (six) hours as needed. For shortness of breath       . cephALEXin (KEFLEX) 500 MG capsule Take 500 mg by mouth 2 (two) times daily.      . cholecalciferol (VITAMIN D) 1000 UNITS tablet Take 2,000 Units by mouth daily.        . citalopram (CELEXA) 20 MG tablet Take 20 mg by mouth daily.       . clobetasol ointment (TEMOVATE) 0.05 % Apply 1 application topically daily as needed. For psoraisis       . dexamethasone (DECADRON) 4 MG tablet Take 4 mg by mouth as directed.        Marland Kitchen dexamethasone (DECADRON) 4 MG tablet Take 2 tablets two times a day the day before Taxotere. Then take 2 tabs two times a day starting the day after chemo for 3 days.  30 tablet  1  . digoxin (LANOXIN) 0.25 MG tablet Take 250 mcg by mouth daily.       . fluticasone (FLONASE) 50 MCG/ACT nasal spray Place 2 sprays into the nose daily as needed. For congestion      . glucosamine-chondroitin 500-400 MG tablet Take 1 tablet by mouth daily.       . Halcinonide (HALOG) 0.1 % CREA Apply 1 application topically daily as needed. For eczema      . levothyroxine (SYNTHROID, LEVOTHROID) 50 MCG tablet TAKE 1 TABLET BY MOUTH EVERY DAY  30 tablet  0  . lidocaine-prilocaine (EMLA) cream Apply topically as needed.        . loperamide (IMODIUM A-D) 2 MG tablet Take 2 mg by mouth 4 (four) times daily as needed. For diarrhea       . metroNIDAZOLE (METROCREAM) 0.75 % cream Apply topically 2 (two) times daily.  45 g  2  . montelukast (SINGULAIR) 10 MG tablet Take 10 mg by mouth at bedtime.          . ondansetron (ZOFRAN) 8 MG tablet Take 1 tablet (8 mg total) by mouth every 12 (twelve) hours as needed for nausea.  30 tablet  2  . OVER THE COUNTER MEDICATION Take 1 tablet by mouth daily as needed. For gas pain      . pimecrolimus (ELIDEL) 1 % cream Apply 1 application topically 2 (two) times daily as needed. For exczema       . prochlorperazine (COMPAZINE) 10 MG tablet Take 10 mg by mouth every 6 (six) hours as needed.        Marland Kitchen PULMICORT FLEXHALER 180 MCG/ACT inhaler INHALE 2 PUFFS BY MOUTH EVERY MORNING  3 Inhaler  0  . tobramycin-dexamethasone (TOBRADEX) ophthalmic solution Place 1 drop into both eyes 2 (two) times daily as needed.        . vitamin B-12 (CYANOCOBALAMIN) 100 MCG tablet Take 2,000 mcg by mouth daily.  Allergies:  Allergies  Allergen Reactions  . Aspirin     Hx of TTP  . Nsaids     Hx TTP    Physical Exam: Filed Vitals:   10/26/11 1029  BP: 119/69  Pulse: 66  Temp: 98.5 F (36.9 C)   HEENT:  Sclerae anicteric, conjunctivae pink.  Oropharynx clear.  Nodes:  No cervical, supraclavicular, or axillary lymphadenopathy palpated.  Breast Exam: Deferred Lungs:  Clear to auscultation bilaterally.  No crackles, rhonchi, or wheezes.   Heart:  Regular rate and rhythm.   Abdomen:  Soft, obese, nontender.  Positive bowel sounds.  No organomegaly or masses palpated.   Musculoskeletal:  no focal spinal tenderness to palpation.  Extremities:  Benign.  No peripheral edema or cyanosis.   Skin:  Benign.   Neuro:  Nonfocal.   Lab Results: Lab Results  Component Value Date   WBC 14.5* 10/26/2011   HGB 11.7 10/26/2011   HCT 34.9 10/26/2011   MCV 94.1 10/26/2011   PLT 162 10/26/2011   NEUTROABS 12.6* 10/26/2011     Chemistry      Component Value Date/Time   NA 136 10/12/2011 0952   K 3.7 10/12/2011 0952   CL 98 10/12/2011 0952   CO2 29 10/12/2011 0952   BUN 8 10/12/2011 0952   CREATININE 0.71 10/12/2011 0952      Component Value Date/Time   CALCIUM 8.7 10/12/2011  0952   ALKPHOS 75 10/12/2011 0952   AST 25 10/12/2011 0952   ALT 36* 10/12/2011 0952   BILITOT 0.7 10/12/2011 0952        Radiological Studies:  2-D echocardiogram on 10/13/11 showed an ejection fraction of 60%.  Assessment:  55 year old [place] woman originally from [place]   1.  Status post left lumpectomy and sentinel lymph node sampling 08/03/2011 for a T1C N0 (Stage I) invasive ductal carcinoma, high-grade, triple positive.  2.  Being treated in the adjuvant setting with 4 plan doses of docetaxel and cyclophosphamide given along with trastuzumab on a q. 3 week basis, with Neulasta on day 2 for granulocyte support.  3. Trastuzumab to be continued for total of one year  4. Patient will need radiation therapy following chemotherapy.  5. Comorbidities include a history of remote thrombotic thrombocytopenic purpura and hepatitis C.   Plan:  Tara [name] will proceed to treatment today as scheduled for her third cycle of adjuvant chemotherapy. She will return tomorrow for her Neulasta injection and I will see her next week on March 4 for assessment of chemotoxicity. I advised her to begin a B complex vitamin daily, and we will reassess her neuropathy on a regular basis.  We again reviewed Tara [name]'s antinausea regimen and she voices an understanding of how to utilize all of those medications appropriately.   She voices understanding and agreement with our plan, and call with any changes or problems.  Tara Kirkey, PA-C 10/26/2011

## 2011-10-26 NOTE — Telephone Encounter (Signed)
S/w the pt and she is aware of her cardiology appt on 11/19/2011@9 :30am.

## 2011-10-26 NOTE — Patient Instructions (Signed)
Patient aware of next appointment; discharged home with husband 

## 2011-10-27 ENCOUNTER — Ambulatory Visit (HOSPITAL_BASED_OUTPATIENT_CLINIC_OR_DEPARTMENT_OTHER): Payer: Managed Care, Other (non HMO)

## 2011-10-27 VITALS — BP 151/86 | HR 61 | Temp 98.4°F

## 2011-10-27 DIAGNOSIS — C50919 Malignant neoplasm of unspecified site of unspecified female breast: Secondary | ICD-10-CM

## 2011-10-27 DIAGNOSIS — Z5189 Encounter for other specified aftercare: Secondary | ICD-10-CM

## 2011-10-27 MED ORDER — PEGFILGRASTIM INJECTION 6 MG/0.6ML
6.0000 mg | Freq: Once | SUBCUTANEOUS | Status: AC
  Administered 2011-10-27: 6 mg via SUBCUTANEOUS
  Filled 2011-10-27: qty 0.6

## 2011-10-30 ENCOUNTER — Telehealth: Payer: Self-pay | Admitting: Oncology

## 2011-10-30 NOTE — Telephone Encounter (Signed)
S/w the pt and she is aware of her r/s appt time from 10:30am to 1:45pm on the same day due to the md's schedule

## 2011-11-02 ENCOUNTER — Encounter: Payer: Self-pay | Admitting: Physician Assistant

## 2011-11-02 ENCOUNTER — Encounter: Payer: Self-pay | Admitting: Radiation Oncology

## 2011-11-02 ENCOUNTER — Ambulatory Visit: Payer: Managed Care, Other (non HMO) | Admitting: Lab

## 2011-11-02 ENCOUNTER — Other Ambulatory Visit: Payer: Managed Care, Other (non HMO) | Admitting: Lab

## 2011-11-02 ENCOUNTER — Ambulatory Visit (HOSPITAL_BASED_OUTPATIENT_CLINIC_OR_DEPARTMENT_OTHER): Payer: Managed Care, Other (non HMO) | Admitting: Physician Assistant

## 2011-11-02 ENCOUNTER — Telehealth: Payer: Self-pay | Admitting: Oncology

## 2011-11-02 VITALS — BP 110/68 | HR 80 | Temp 97.7°F | Ht 67.0 in | Wt 247.9 lb

## 2011-11-02 DIAGNOSIS — C50919 Malignant neoplasm of unspecified site of unspecified female breast: Secondary | ICD-10-CM

## 2011-11-02 LAB — CBC WITH DIFFERENTIAL/PLATELET
BASO%: 0.8 % (ref 0.0–2.0)
Basophils Absolute: 0 10*3/uL (ref 0.0–0.1)
EOS%: 3 % (ref 0.0–7.0)
Eosinophils Absolute: 0.2 10*3/uL (ref 0.0–0.5)
HCT: 35.5 % (ref 34.8–46.6)
HGB: 11.9 g/dL (ref 11.6–15.9)
LYMPH%: 14.9 % (ref 14.0–49.7)
MCH: 31.5 pg (ref 25.1–34.0)
MCHC: 33.5 g/dL (ref 31.5–36.0)
MCV: 93.9 fL (ref 79.5–101.0)
MONO#: 0.9 10*3/uL (ref 0.1–0.9)
MONO%: 16.8 % — ABNORMAL HIGH (ref 0.0–14.0)
NEUT#: 3.4 10*3/uL (ref 1.5–6.5)
NEUT%: 64.5 % (ref 38.4–76.8)
Platelets: 180 10*3/uL (ref 145–400)
RBC: 3.78 10*6/uL (ref 3.70–5.45)
RDW: 15.5 % — ABNORMAL HIGH (ref 11.2–14.5)
WBC: 5.3 10*3/uL (ref 3.9–10.3)
lymph#: 0.8 10*3/uL — ABNORMAL LOW (ref 0.9–3.3)
nRBC: 1 % — ABNORMAL HIGH (ref 0–0)

## 2011-11-02 NOTE — Progress Notes (Signed)
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 Moved here from [place] 2012. GXP0.   Left breast lumpectomy and sentinel lymph node sampling done 08/03/2011. Confirmed a 1.6 cm invasive ductal carcinoma grade 3. Margins were ample. Two sentinel lymph nodes were clear. Dr. Darnelle Catalan planned to administer docetaxel and cyclophophamide together with trastuzumab every 3 weeks for 4 treatments. The trastuzumab would continue to complete a year.   T1C N0 Stage I Triple positive AX: aspirin         Nsaids No indication of a pacemaker No hx of radiation therapy

## 2011-11-02 NOTE — Telephone Encounter (Signed)
gve the pt her march,april 2013 appt calendar. S/w kathy from rad onc and she will contact the pt directly with the appt to see dr Dayton Scrape

## 2011-11-02 NOTE — Progress Notes (Signed)
Hematology and Oncology Follow Up Visit  Tara [name] [id number] Jul 01, 1956 56 y.o. 11/02/2011    HPI: Tara [name] noted a mass in her left breast and brought it to her physician's attention in [place], where she was residing until recently. She was set up for mammography 06/24/2011 at [facility] in [place], [place]. This showed a suspicious abnormality in the left breast measuring approximately 10 mm. Physical examination showed a 1 cm firm superficial mass in the axillary region of the breast and ultrasound showed this to be 11 mm and spiculated.   Biopsy was performed June 25, 2011, and showed ([id number] at the [facility]) and invasive ductal carcinoma, grade 3, estrogen receptor 95% positive, progesterone receptor 40% positive, with an MIB-1 of 29%. The tumor is HER 2 positive by FISH with a ratio of 2.5. There is tumor heterogeneity noted.    With this information the case was presented Jul 08, 2011 at the multidisciplinary breast cancer conference and ithe patient was evaluated that afternoon at the multidisciplinary breast cancer clinic. She proceeded to definitive left lumpectomy and sentinel lymph node sampling 08/03/2011. This confirmed a 1.6 cm invasive ductal carcinoma, grade 3. Margins were ample. The two sentinel lymph nodes were clear.   The decision was made to proceed with 4 cycles of adjuvant chemotherapy consisting of docetaxel/cyclophosphamide given with trastuzumab every 3 weeks with Neulasta on day 2 for granulocyte support Trastuzumab will be continued for total of one year.   Interim History:   Tara [name] returns today for followup of her left breast cancer. She is currently day 8 cycle 3 of 4 planned q. three-week doses of trastuzumab/docetaxel/cyclophosphamide given in the adjuvant setting.  Overall, Tara [name] is tolerating treatment well, but continues to have increased fatigue. She went to church Sunday and afterwards felt "drained" and needed a nap.  She has a history of asthma, and has also noticed increasing shortness of breath. She is using her inhaler daily, but has not used her "rescue inhaler". She's had no increased cough, no phlegm production, no pleurisy. She also denies any fevers, chills, or night sweats. She's eating and drinking well although her appetite is decreased. She's had no nausea and no change in bowel habits. She does continue to have some peripheral neuropathy although this is mild, and has not worsened or changed. Primarily this is affecting the fingertips.  A detailed review of systems is otherwise noncontributory as noted below.  Review of Systems: Constitutional:  Fatigue, no weight loss, fever, night sweats Eyes: uses glasses WUJ:[id number] Cardiovascular: no chest pain or dyspnea on exertion Respiratory: positive for shortness of breath;  no cough or wheezing Neurological: positive for - numbness/tingling Dermatological: benign Gastrointestinal: no abdominal pain, change in bowel habits, or black or bloody stools Genito-Urinary: no dysuria, trouble voiding, or hematuria Hematological and Lymphatic: negative Breast: negative Musculoskeletal: negative Remaining ROS negative.   Family history: Patient's father died at the age of 64 from emphysema in the setting of tobacco abuse patient's mother is alive at 47 the patient has one brother and one sister there is no history of breast or ovarian cancer in the immediate family.  Gynecologic history: She is GX P0 menarche age 41 most recent period July of 2008 she never took hormone replacement   Social History: the patient is a homemaker; she used to work in an office previously. Her husband [name] is a Magazine features editor. They moved here from [facility] Oct 2012.  Medications:   I have reviewed the patient's current medications.  Current Outpatient Prescriptions  Medication Sig Dispense Refill  . acetaminophen (TYLENOL) 500 MG tablet Take 500 mg by mouth every 6 (six) hours as  needed. For pain       . albuterol (PROVENTIL HFA;VENTOLIN HFA) 108 (90 BASE) MCG/ACT inhaler Inhale 2 puffs into the lungs every 6 (six) hours as needed. For shortness of breath       . cephALEXin (KEFLEX) 500 MG capsule Take 500 mg by mouth 2 (two) times daily.      . cholecalciferol (VITAMIN D) 1000 UNITS tablet Take 2,000 Units by mouth daily.        . citalopram (CELEXA) 20 MG tablet Take 20 mg by mouth daily.       . clobetasol ointment (TEMOVATE) 0.05 % Apply 1 application topically daily as needed. For psoraisis       . dexamethasone (DECADRON) 4 MG tablet Take 4 mg by mouth as directed.        Marland Kitchen dexamethasone (DECADRON) 4 MG tablet Take 2 tablets two times a day the day before Taxotere. Then take 2 tabs two times a day starting the day after chemo for 3 days.  30 tablet  1  . digoxin (LANOXIN) 0.25 MG tablet Take 250 mcg by mouth daily.       . fluticasone (FLONASE) 50 MCG/ACT nasal spray Place 2 sprays into the nose daily as needed. For congestion      . glucosamine-chondroitin 500-400 MG tablet Take 1 tablet by mouth daily.       . Halcinonide (HALOG) 0.1 % CREA Apply 1 application topically daily as needed. For eczema      . levothyroxine (SYNTHROID, LEVOTHROID) 50 MCG tablet TAKE 1 TABLET BY MOUTH EVERY DAY  30 tablet  0  . lidocaine-prilocaine (EMLA) cream Apply topically as needed.        . loperamide (IMODIUM A-D) 2 MG tablet Take 2 mg by mouth 4 (four) times daily as needed. For diarrhea       . metroNIDAZOLE (METROCREAM) 0.75 % cream Apply topically 2 (two) times daily.  45 g  2  . montelukast (SINGULAIR) 10 MG tablet Take 10 mg by mouth at bedtime.        . ondansetron (ZOFRAN) 8 MG tablet Take 1 tablet (8 mg total) by mouth every 12 (twelve) hours as needed for nausea.  30 tablet  2  . OVER THE COUNTER MEDICATION Take 1 tablet by mouth daily as needed. For gas pain      . pimecrolimus (ELIDEL) 1 % cream Apply 1 application topically 2 (two) times daily as needed. For exczema        . prochlorperazine (COMPAZINE) 10 MG tablet Take 10 mg by mouth every 6 (six) hours as needed.        Marland Kitchen PULMICORT FLEXHALER 180 MCG/ACT inhaler INHALE 2 PUFFS BY MOUTH EVERY MORNING  3 Inhaler  0  . tobramycin-dexamethasone (TOBRADEX) ophthalmic solution Place 1 drop into both eyes 2 (two) times daily as needed.        . vitamin B-12 (CYANOCOBALAMIN) 100 MCG tablet Take 2,000 mcg by mouth daily.         Allergies:  Allergies  Allergen Reactions  . Aspirin     Hx of TTP  . Nsaids     Hx TTP    Physical Exam: Filed Vitals:   11/02/11 1129  BP: 110/68  Pulse: 80  Temp: 97.7 F (36.5 C)   HEENT:  Sclerae anicteric, conjunctivae pink.  Oropharynx clear.  Nodes:  No cervical, supraclavicular, or axillary lymphadenopathy palpated.  Breast Exam: Deferred Lungs:  Clear to auscultation bilaterally.  No crackles, rhonchi, or wheezes.   Heart:  Regular rate and rhythm.   Abdomen:  Soft, obese, nontender.  Positive bowel sounds.  No organomegaly or masses palpated.   Musculoskeletal:  no focal spinal tenderness to palpation.  Extremities:  Benign.  No peripheral edema or cyanosis.   Skin:  Benign.   Neuro:  Nonfocal.   Lab Results: Lab Results  Component Value Date   WBC 5.3 11/02/2011   HGB 11.9 11/02/2011   HCT 35.5 11/02/2011   MCV 93.9 11/02/2011   PLT 180 11/02/2011   NEUTROABS 3.4 11/02/2011     Chemistry      Component Value Date/Time   NA 134* 10/26/2011 1014   K 4.1 10/26/2011 1014   CL 97 10/26/2011 1014   CO2 26 10/26/2011 1014   BUN 11 10/26/2011 1014   CREATININE 0.59 10/26/2011 1014      Component Value Date/Time   CALCIUM 9.0 10/26/2011 1014   ALKPHOS 61 10/26/2011 1014   AST 33 10/26/2011 1014   ALT 45* 10/26/2011 1014   BILITOT 0.7 10/26/2011 1014        Radiological Studies:  2-D echocardiogram on 10/13/11 showed an ejection fraction of 60%.  Assessment:  56 year old [place] woman originally from [place]   1.  Status post left lumpectomy and sentinel lymph  node sampling 08/03/2011 for a T1C N0 (Stage I) invasive ductal carcinoma, high-grade, triple positive.  2.  Being treated in the adjuvant setting with 4 plan doses of docetaxel and cyclophosphamide given along with trastuzumab on a q. 3 week basis, with Neulasta on day 2 for granulocyte support.  3. Trastuzumab to be continued for total of one year  4. Patient will need radiation therapy following chemotherapy.  5. Comorbidities include a history of remote thrombotic thrombocytopenic purpura and hepatitis C.   Plan:  Tonnie continues to tolerate treatment well, and will return in 2 weeks for her fourth and final dose of adjuvant chemotherapy. She'll be seen by Dr. Dayton Scrape to initiate radiation therapy. Of course we'll continue to administer trastuzumab on a Q. three-week basis for total of one year. She is already scheduled for repeat echocardiogram on March 21.   She voices understanding and agreement with our plan, and call with any changes or problems.  Achille Xiang, PA-C 11/02/2011

## 2011-11-03 ENCOUNTER — Ambulatory Visit: Payer: Managed Care, Other (non HMO)

## 2011-11-03 ENCOUNTER — Ambulatory Visit: Payer: Managed Care, Other (non HMO) | Admitting: Radiation Oncology

## 2011-11-09 ENCOUNTER — Other Ambulatory Visit: Payer: Self-pay | Admitting: Physician Assistant

## 2011-11-09 DIAGNOSIS — E039 Hypothyroidism, unspecified: Secondary | ICD-10-CM

## 2011-11-09 DIAGNOSIS — C50919 Malignant neoplasm of unspecified site of unspecified female breast: Secondary | ICD-10-CM

## 2011-11-16 ENCOUNTER — Ambulatory Visit (HOSPITAL_BASED_OUTPATIENT_CLINIC_OR_DEPARTMENT_OTHER): Payer: Managed Care, Other (non HMO) | Admitting: Physician Assistant

## 2011-11-16 ENCOUNTER — Ambulatory Visit (HOSPITAL_BASED_OUTPATIENT_CLINIC_OR_DEPARTMENT_OTHER): Payer: Managed Care, Other (non HMO)

## 2011-11-16 ENCOUNTER — Encounter: Payer: Self-pay | Admitting: Physician Assistant

## 2011-11-16 ENCOUNTER — Other Ambulatory Visit (HOSPITAL_BASED_OUTPATIENT_CLINIC_OR_DEPARTMENT_OTHER): Payer: Managed Care, Other (non HMO) | Admitting: Lab

## 2011-11-16 VITALS — BP 153/77 | HR 74 | Temp 97.7°F | Ht 67.0 in | Wt 256.3 lb

## 2011-11-16 DIAGNOSIS — C50919 Malignant neoplasm of unspecified site of unspecified female breast: Secondary | ICD-10-CM

## 2011-11-16 DIAGNOSIS — Z5112 Encounter for antineoplastic immunotherapy: Secondary | ICD-10-CM

## 2011-11-16 DIAGNOSIS — C50419 Malignant neoplasm of upper-outer quadrant of unspecified female breast: Secondary | ICD-10-CM

## 2011-11-16 DIAGNOSIS — Z170 Estrogen receptor positive status [ER+]: Secondary | ICD-10-CM

## 2011-11-16 LAB — CBC WITH DIFFERENTIAL/PLATELET
BASO%: 0.1 % (ref 0.0–2.0)
Basophils Absolute: 0 10*3/uL (ref 0.0–0.1)
EOS%: 0 % (ref 0.0–7.0)
Eosinophils Absolute: 0 10*3/uL (ref 0.0–0.5)
HCT: 33.1 % — ABNORMAL LOW (ref 34.8–46.6)
HGB: 11.2 g/dL — ABNORMAL LOW (ref 11.6–15.9)
LYMPH%: 3.8 % — ABNORMAL LOW (ref 14.0–49.7)
MCH: 31.7 pg (ref 25.1–34.0)
MCHC: 33.8 g/dL (ref 31.5–36.0)
MCV: 93.8 fL (ref 79.5–101.0)
MONO#: 0.8 10*3/uL (ref 0.1–0.9)
MONO%: 5.4 % (ref 0.0–14.0)
NEUT#: 13.4 10*3/uL — ABNORMAL HIGH (ref 1.5–6.5)
NEUT%: 90.7 % — ABNORMAL HIGH (ref 38.4–76.8)
Platelets: 166 10*3/uL (ref 145–400)
RBC: 3.53 10*6/uL — ABNORMAL LOW (ref 3.70–5.45)
RDW: 16.6 % — ABNORMAL HIGH (ref 11.2–14.5)
WBC: 14.7 10*3/uL — ABNORMAL HIGH (ref 3.9–10.3)
lymph#: 0.6 10*3/uL — ABNORMAL LOW (ref 0.9–3.3)
nRBC: 0 % (ref 0–0)

## 2011-11-16 LAB — COMPREHENSIVE METABOLIC PANEL
ALT: 63 U/L — ABNORMAL HIGH (ref 0–35)
AST: 41 U/L — ABNORMAL HIGH (ref 0–37)
Albumin: 3.7 g/dL (ref 3.5–5.2)
Alkaline Phosphatase: 66 U/L (ref 39–117)
BUN: 13 mg/dL (ref 6–23)
CO2: 27 mEq/L (ref 19–32)
Calcium: 9 mg/dL (ref 8.4–10.5)
Chloride: 102 mEq/L (ref 96–112)
Creatinine, Ser: 0.6 mg/dL (ref 0.50–1.10)
Glucose, Bld: 150 mg/dL — ABNORMAL HIGH (ref 70–99)
Potassium: 4.5 mEq/L (ref 3.5–5.3)
Sodium: 140 mEq/L (ref 135–145)
Total Bilirubin: 0.6 mg/dL (ref 0.3–1.2)
Total Protein: 7.1 g/dL (ref 6.0–8.3)

## 2011-11-16 MED ORDER — ONDANSETRON 16 MG/50ML IVPB (CHCC)
16.0000 mg | Freq: Once | INTRAVENOUS | Status: AC
  Administered 2011-11-16: 16 mg via INTRAVENOUS

## 2011-11-16 MED ORDER — SODIUM CHLORIDE 0.9 % IV SOLN
600.0000 mg/m2 | Freq: Once | INTRAVENOUS | Status: AC
  Administered 2011-11-16: 1400 mg via INTRAVENOUS
  Filled 2011-11-16: qty 70

## 2011-11-16 MED ORDER — TRASTUZUMAB CHEMO INJECTION 440 MG
6.0000 mg/kg | Freq: Once | INTRAVENOUS | Status: AC
  Administered 2011-11-16: 693 mg via INTRAVENOUS
  Filled 2011-11-16: qty 33

## 2011-11-16 MED ORDER — DOCETAXEL CHEMO INJECTION 160 MG/16ML
75.0000 mg/m2 | Freq: Once | INTRAVENOUS | Status: AC
  Administered 2011-11-16: 170 mg via INTRAVENOUS
  Filled 2011-11-16: qty 17

## 2011-11-16 MED ORDER — DIPHENHYDRAMINE HCL 25 MG PO CAPS
25.0000 mg | ORAL_CAPSULE | Freq: Once | ORAL | Status: AC
  Administered 2011-11-16: 25 mg via ORAL

## 2011-11-16 MED ORDER — DEXAMETHASONE SODIUM PHOSPHATE 4 MG/ML IJ SOLN
20.0000 mg | Freq: Once | INTRAMUSCULAR | Status: AC
  Administered 2011-11-16: 20 mg via INTRAVENOUS

## 2011-11-16 MED ORDER — ACETAMINOPHEN 325 MG PO TABS
650.0000 mg | ORAL_TABLET | Freq: Once | ORAL | Status: AC
  Administered 2011-11-16: 650 mg via ORAL

## 2011-11-16 NOTE — Progress Notes (Signed)
ID: Tara [name]   DOB: 22-Jul-1956  MR#: [id number]  WUJ#:[id number]  HISTORY OF PRESENT ILLNESS: Tara [name] noted a mass in her left breast and brought it to her physician's attention in [place], where she was residing until recently. She was set up for mammography 06/24/2011 at [facility] in [place], [place]. This showed a suspicious abnormality in the left breast measuring approximately 10 mm. Physical examination showed a 1 cm firm superficial mass in the axillary region of the breast and ultrasound showed this to be 11 mm and spiculated.  Biopsy was performed June 25, 2011, and showed ([id number] at the [facility]) and invasive ductal carcinoma, grade 3, estrogen receptor 95% positive, progesterone receptor 40% positive, with an MIB-1 of 29%. The tumor is HER 2 positive by FISH with a ratio of 2.5. There is tumor heterogeneity noted.  With this information the case was presented Jul 08, 2011 at the multidisciplinary breast cancer conference and ithe patient was evaluated that afternoon at the multidisciplinary breast cancer clinic. She proceeded to definitive left lumpectomy and sentinel lymph node sampling 08/03/2011. This confirmed a 1.6 cm invasive ductal carcinoma, grade 3. Margins were ample. The two sentinel lymph nodes were clear.  The decision was made to proceed with 4 cycles of adjuvant chemotherapy consisting of docetaxel/cyclophosphamide given with trastuzumab every 3 weeks with Neulasta on day 2 for granulocyte support. Trastuzumab will be continued for total of one year.  INTERVAL HISTORY: Tara [name] returns today for followup of her left breast carcinoma, due for her fourth and final every 3 week dose of trastuzumab/docetaxel/cyclophosphamide, given in the adjuvant setting.  REVIEW OF SYSTEMS: Tara [name]'s primary complaints today are fatigue and shortness of breath. She feels very tired. She gets "winded" when she is exerting herself, and sometimes even with  conversation. She uses her "rescue inhaler" with relief, and continues to use her daily inhalers as instructed. She's had no cough, phlegm production, or pleurisy. No fevers, chills, or night sweats.  Tara [name] has had a "tiny bit" of tingling in her fingertips. She fell issues dropping things last week, but that has resolved, and the neuropathy has improved. It is currently not affecting any of her day-to-day activities, and she can perform fine motor skills without difficulty. No increased signs of neuropathy in the lower extremities.  Finally, Tara [name] had bleeding with bowel movements for 3 days last week although this has completely resolved. It occurred only with bowel movements and was bright red. She's had no additional bleeding elsewhere. She currently denies diarrhea or constipation, and is having regular bowel movements with no bleeding.  A detailed review of systems is otherwise noncontributory.   PAST MEDICAL HISTORY: Past Medical History  Diagnosis Date  . Thrombocytopenia, primary   . Hepatitis C   . Asthma   . Arthritis   . Complication of anesthesia     difficulty waking up/dizzy/lightheaded  . Environmental allergies   . Shortness of breath   . Hypothyroidism   . TTP (thrombotic thrombocytopenic purpura)   . Blood transfusion   . Anxiety   . Dysrhythmia     irregular heartbeat, takes digoxin  . Chronic edema   . Breast cancer 2012    left breast    PAST SURGICAL HISTORY: Past Surgical History  Procedure Date  . Elbow pins     left  . Right hip replacement   . Left foot reconstruction   . Ortho left foot surg    . Joint replacement     bilat  hip  . Portacath placement 08/03/2011    Procedure: INSERTION PORT-A-CATH;  Surgeon: Robyne Askew, MD;  [facility] OR;  Service: General;  Laterality: Right;  . Breast lumpectomy 08/03/11    left lumpectomy and slnbx,T1cN0,triple pos    FAMILY HISTORY Family History  Problem Relation Age of Onset  . Cancer Maternal  Grandfather     brain  . Alcohol abuse Maternal Grandfather   . Heart disease Neg Hx   . Hyperlipidemia Neg Hx   . Hypertension Neg Hx     Gynecologic history: She is GX P0 menarche age 34 most recent period July of 2008 she never took hormone replacement   Social History: the patient is a homemaker; she used to work in an office previously. Her husband Tara [name] is a Magazine features editor. They moved here from [facility] Oct 2012.    ADVANCED DIRECTIVES:  HEALTH MAINTENANCE: History  Substance Use Topics  . Smoking status: Never Smoker   . Smokeless tobacco: Not on file  . Alcohol Use: 0.6 oz/week    1 Glasses of wine per week     Colonoscopy:  PAP:  Bone density:  Lipid panel:  Allergies  Allergen Reactions  . Aspirin     Hx of TTP  . Nsaids     Hx TTP    Current Outpatient Prescriptions  Medication Sig Dispense Refill  . acetaminophen (TYLENOL) 500 MG tablet Take 500 mg by mouth every 6 (six) hours as needed. For pain       . albuterol (PROVENTIL HFA;VENTOLIN HFA) 108 (90 BASE) MCG/ACT inhaler Inhale 2 puffs into the lungs every 6 (six) hours as needed. For shortness of breath       . cephALEXin (KEFLEX) 500 MG capsule Take 500 mg by mouth 2 (two) times daily.      . cholecalciferol (VITAMIN D) 1000 UNITS tablet Take 2,000 Units by mouth daily.        . citalopram (CELEXA) 20 MG tablet Take 20 mg by mouth daily.       . clobetasol ointment (TEMOVATE) 0.05 % Apply 1 application topically daily as needed. For psoraisis       . dexamethasone (DECADRON) 4 MG tablet Take 4 mg by mouth as directed.        Marland Kitchen dexamethasone (DECADRON) 4 MG tablet Take 2 tablets two times a day the day before Taxotere. Then take 2 tabs two times a day starting the day after chemo for 3 days.  30 tablet  1  . digoxin (LANOXIN) 0.25 MG tablet Take 250 mcg by mouth daily.       . fluticasone (FLONASE) 50 MCG/ACT nasal spray Place 2 sprays into the nose daily as needed. For congestion      . glucosamine-chondroitin  500-400 MG tablet Take 1 tablet by mouth daily.       . Halcinonide (HALOG) 0.1 % CREA Apply 1 application topically daily as needed. For eczema      . levothyroxine (SYNTHROID, LEVOTHROID) 50 MCG tablet TAKE 1 TABLET BY MOUTH EVERY DAY  30 tablet  1  . lidocaine-prilocaine (EMLA) cream Apply topically as needed.        . loperamide (IMODIUM A-D) 2 MG tablet Take 2 mg by mouth 4 (four) times daily as needed. For diarrhea       . metroNIDAZOLE (METROCREAM) 0.75 % cream Apply topically 2 (two) times daily.  45 g  2  . montelukast (SINGULAIR) 10 MG tablet Take 10 mg by mouth  at bedtime.        . ondansetron (ZOFRAN) 8 MG tablet Take 1 tablet (8 mg total) by mouth every 12 (twelve) hours as needed for nausea.  30 tablet  2  . OVER THE COUNTER MEDICATION Take 1 tablet by mouth daily as needed. For gas pain      . pimecrolimus (ELIDEL) 1 % cream Apply 1 application topically 2 (two) times daily as needed. For exczema       . prochlorperazine (COMPAZINE) 10 MG tablet Take 10 mg by mouth every 6 (six) hours as needed.        Marland Kitchen PULMICORT FLEXHALER 180 MCG/ACT inhaler INHALE 2 PUFFS BY MOUTH EVERY MORNING  3 Inhaler  0  . tobramycin-dexamethasone (TOBRADEX) ophthalmic solution Place 1 drop into both eyes 2 (two) times daily as needed.        . vitamin B-12 (CYANOCOBALAMIN) 100 MCG tablet Take 2,000 mcg by mouth daily.         OBJECTIVE: Filed Vitals:   11/16/11 1029  BP: 153/77  Pulse: 74  Temp: 97.7 F (36.5 C)     Body mass index is 40.14 kg/(m^2).    ECOG FS: 0  Physical Exam: HEENT:  Sclerae anicteric, conjunctivae pink.  Oropharynx clear.  No mucositis or candidiasis.   Nodes:  No cervical, supraclavicular, or axillary lymphadenopathy palpated.  Breast Exam:  Deferred Lungs:  Clear to auscultation bilaterally.  No crackles, rhonchi, or wheezes.   Heart:  Regular rate and rhythm.   Abdomen:  Soft, obese, nontender.  Positive bowel sounds.  No organomegaly or masses palpated.     Musculoskeletal:  No focal spinal tenderness to palpation.  Extremities:  Benign.  No peripheral edema or cyanosis.   Skin:  Benign.   Neuro:  Nonfocal.    LAB RESULTS: Lab Results  Component Value Date   WBC 14.7* 11/16/2011   NEUTROABS 13.4* 11/16/2011   HGB 11.2* 11/16/2011   HCT 33.1* 11/16/2011   MCV 93.8 11/16/2011   PLT 166 11/16/2011      Chemistry      Component Value Date/Time   NA 134* 10/26/2011 1014   K 4.1 10/26/2011 1014   CL 97 10/26/2011 1014   CO2 26 10/26/2011 1014   BUN 11 10/26/2011 1014   CREATININE 0.59 10/26/2011 1014      Component Value Date/Time   CALCIUM 9.0 10/26/2011 1014   ALKPHOS 61 10/26/2011 1014   AST 33 10/26/2011 1014   ALT 45* 10/26/2011 1014   BILITOT 0.7 10/26/2011 1014       Lab Results  Component Value Date   LABCA2 29 07/08/2011     STUDIES: 2-D echocardiogram on 10/13/11 showed an ejection fraction of 60%.   ASSESSMENT: 56 year old [place] woman originally from [place]  1. Status post left lumpectomy and sentinel lymph node sampling 08/03/2011 for a T1C N0 (Stage I) invasive ductal carcinoma, high-grade, triple positive.  2. Being treated in the adjuvant setting with 4 plan doses of docetaxel and cyclophosphamide given along with trastuzumab on a q. 3 week basis, with Neulasta on day 2 for granulocyte support.  3. Trastuzumab to be continued for total of one year  4. Patient will need radiation therapy following chemotherapy.  5. Comorbidities include a history of remote thrombotic thrombocytopenic purpura and hepatitis C.    PLAN: Tara [name] will proceed to treatment today as scheduled for her fourth and final dose of docetaxel/cyclophosphamide/trastuzumab. She'll return tomorrow for her Neulasta injection. She scheduled to meet  with Dr. Gala Romney to review her cardiac function next week. I will see her on the 26th for assessment of chemotoxicity, after which she'll be seen in radiation to discuss upcoming radiation therapy.  Tara [name]  will continue to receive trastuzumab on a Q. three-week basis for a total of one year. This plan was reviewed with her and she voices understanding and agreement. She knows to call with any changes or problems.  Edison Wollschlager    11/16/2011

## 2011-11-17 ENCOUNTER — Ambulatory Visit (HOSPITAL_BASED_OUTPATIENT_CLINIC_OR_DEPARTMENT_OTHER): Payer: Managed Care, Other (non HMO)

## 2011-11-17 ENCOUNTER — Encounter: Payer: Self-pay | Admitting: *Deleted

## 2011-11-17 VITALS — BP 131/81 | HR 87 | Temp 97.5°F

## 2011-11-17 DIAGNOSIS — C50919 Malignant neoplasm of unspecified site of unspecified female breast: Secondary | ICD-10-CM

## 2011-11-17 DIAGNOSIS — Z5189 Encounter for other specified aftercare: Secondary | ICD-10-CM

## 2011-11-17 MED ORDER — PEGFILGRASTIM INJECTION 6 MG/0.6ML
6.0000 mg | Freq: Once | SUBCUTANEOUS | Status: AC
  Administered 2011-11-17: 6 mg via SUBCUTANEOUS
  Filled 2011-11-17: qty 0.6

## 2011-11-19 ENCOUNTER — Ambulatory Visit (HOSPITAL_COMMUNITY)
Admission: RE | Admit: 2011-11-19 | Discharge: 2011-11-19 | Disposition: A | Discharge status: Home / Self Care | Payer: Managed Care, Other (non HMO) | Source: Ambulatory Visit | Attending: Internal Medicine | Admitting: Internal Medicine

## 2011-11-19 VITALS — BP 124/72 | HR 105 | Ht 67.0 in | Wt 253.5 lb

## 2011-11-19 DIAGNOSIS — I499 Cardiac arrhythmia, unspecified: Secondary | ICD-10-CM | POA: Insufficient documentation

## 2011-11-19 DIAGNOSIS — C50919 Malignant neoplasm of unspecified site of unspecified female breast: Secondary | ICD-10-CM

## 2011-11-19 DIAGNOSIS — R0602 Shortness of breath: Secondary | ICD-10-CM | POA: Insufficient documentation

## 2011-11-19 NOTE — Assessment & Plan Note (Signed)
Relatively quiescent. ECG shows ectopic atrial rhythm. Given her size and likely OSA she is at high risk for AF and other atrial dysrhythmias. Given asthma I am reluctant to use b-blockers. Will continue digoxin. Can use CCBs, as needed.

## 2011-11-19 NOTE — Assessment & Plan Note (Signed)
Long discussion about role of cardio-oncology clinic. Echos reviewed personally. Parameters are stable. Continue Herceptin.

## 2011-11-19 NOTE — Progress Notes (Signed)
Referring Physician:  Magrinat Reason for Consultation: Herceptin monitoring   HPI:  Tara [name] is a 56 y/o woman with h/o HCV, asthma, palpitations, chronic LE edema, TTP and obesity.  Recently found to have breast CA in [place], where she was residing until recently. She was set up for mammography 06/24/2011 at [facility] in [place], [place]. This showed a suspicious abnormality in the left breast measuring approximately 10 mm. Physical examination showed a 1 cm firm superficial mass in the axillary region of the breast and ultrasound showed this to be 11 mm and spiculated.  Biopsy was performed June 25, 2011, and showed ([id number] at the [facility]) and invasive ductal carcinoma, grade 3, estrogen receptor 95% positive, progesterone receptor 40% positive, with an MIB-1 of 29%. The tumor is HER 2 positive by FISH with a ratio of 2.5. There is tumor heterogeneity noted.  With this information the case was presented Jul 08, 2011 at the multidisciplinary breast cancer conference and ithe patient was evaluated that afternoon at the multidisciplinary breast cancer clinic. She proceeded to definitive left lumpectomy and sentinel lymph node sampling 08/03/2011. This confirmed a 1.6 cm invasive ductal carcinoma, grade 3. Margins were ample. The two sentinel lymph nodes were clear. The decision was made to proceed with 4 cycles of adjuvant chemotherapy consisting of docetaxel/cyclophosphamide given with trastuzumab every 3 weeks with Neulasta on day 2 for granulocyte support. Trastuzumab will be continued for total of one year.  She has now completed her adjuvant chemo. Now starting Herceptin q3weeks x 1 year +XRT. Presents for enrollment into cardio-onc clinic. Denies h/o HF or CAD. Has had palpitations in past and PCP in [place] placed her on digoxin. Also has chronic LE edema. Previously took lasix but no longer taking. Feels she is getting more dyspneic lately. Hard to talk at  times. Feels like it is her asthma. By time she gets her inhaler she feels better. No orthopnea or PND. No CP. Mild dyspnea on walking upstairs. ADLs OK. Snores if she is on her back.  Recent dig level 0.9  Echos reviewed personally in clinic  07/22/11: EF 60% Poor windows. Pseudonormal filling pattern. Unable to see s' well. ~8cm/sec 10/13/11: EF 60% Grade 1 DD. Lateral s' 9.8 cm/s     Review of Systems:     Cardiac Review of Systems: {Y] = yes [ ]  = no  Chest Pain [    ]  Resting SOB [   ] Exertional SOB  Cove.Etienne  ]  Orthopnea [  ]   Pedal Edema [   ]    Palpitations [  y] Syncope  [  ]   Presyncope [   ]  General Review of Systems: [Y] = yes [  ]=no Constitional: recent weight change [  ]; anorexia [  ]; fatigue [ y ]; nausea [ y ]; night sweats [  ]; fever [  ]; or chills [  ];  Dental: poor dentition[  ];  Eye : blurred vision [  ]; diplopia [   ]; vision changes [  ];  Amaurosis fugax[  ]; Resp: cough [  ];  wheezing[ y ];  hemoptysis[  ]; shortness of breath[y  ]; paroxysmal nocturnal dyspnea[  ]; dyspnea on exertion[y  ]; or orthopnea[  ];  GI:  gallstones[  ], vomiting[  ];  dysphagia[  ]; melena[  ];  hematochezia [  ]; heartburn[  ];   GU: kidney stones [  ]; hematuria[  ];   dysuria [  ];  nocturia[  ];  history of     obstruction [  ];                 Skin: rash, swelling[  ];, hair loss[  ];  peripheral edema[  ];  or itching[  ]; Musculosketetal: myalgias[  ];  joint swelling[  ];  joint erythema[  ];  joint pain[ y ];  back pain[  ];  Heme/Lymph: bruising[  ];  bleeding[  ];  anemia[  ];  Neuro: TIA[  ];  headaches[  ];  stroke[  ];  vertigo[  ];  seizures[  ];   paresthesias[  ];  difficulty walking[  ];  Psych:depression[  ]; anxiety[  ];  Endocrine: diabetes[  ];  thyroid dysfunction[  ];  Immunizations: Flu [  ]; Pneumococcal[   ];  Other:  Past Medical History  Diagnosis Date  . Thrombocytopenia, primary   . Hepatitis C   . Asthma   . Arthritis   . Complication of anesthesia     difficulty waking up/dizzy/lightheaded  . Environmental allergies   . Shortness of breath   . Hypothyroidism   . TTP (thrombotic thrombocytopenic purpura)   . Blood transfusion   . Anxiety   . Dysrhythmia     irregular heartbeat, takes digoxin  . Chronic edema   . Breast cancer 2012    left breast    Medications Prior to Admission  Medication Sig Dispense Refill  . acetaminophen (TYLENOL) 500 MG tablet Take 500 mg by mouth every 6 (six) hours as needed. For pain       . albuterol (PROVENTIL HFA;VENTOLIN HFA) 108 (90 BASE) MCG/ACT inhaler Inhale 2 puffs into the lungs every 6 (six) hours as needed. For shortness of breath       . cholecalciferol (VITAMIN D) 1000 UNITS tablet Take 2,000 Units by mouth daily.        . citalopram (CELEXA) 20 MG tablet Take 20 mg by mouth daily.       . clobetasol ointment (TEMOVATE) 0.05 % Apply 1 application topically daily as needed. For psoraisis       . dexamethasone (DECADRON) 4 MG tablet Take 2 tablets two times a day the day before Taxotere. Then take 2 tabs two times a day starting the day after chemo for 3 days.  30 tablet  1  . digoxin (LANOXIN) 0.25 MG tablet Take 250 mcg by mouth daily.       . fluticasone (FLONASE) 50 MCG/ACT nasal spray Place 2 sprays into the nose daily as needed. For congestion      . glucosamine-chondroitin 500-400 MG tablet Take 1 tablet by mouth daily.       . Halcinonide (HALOG) 0.1 % CREA Apply 1 application topically daily as needed. For eczema      . levothyroxine (SYNTHROID, LEVOTHROID) 50 MCG tablet TAKE 1 TABLET BY MOUTH  EVERY DAY  30 tablet  1  . lidocaine-prilocaine (EMLA) cream Apply topically as needed.        . loperamide (IMODIUM A-D) 2 MG tablet Take 2 mg by mouth 4 (four) times daily as needed. For diarrhea       . metroNIDAZOLE (METROCREAM) 0.75  % cream Apply topically 2 (two) times daily.  45 g  2  . montelukast (SINGULAIR) 10 MG tablet Take 10 mg by mouth at bedtime.        . ondansetron (ZOFRAN) 8 MG tablet Take 1 tablet (8 mg total) by mouth every 12 (twelve) hours as needed for nausea.  30 tablet  2  . OVER THE COUNTER MEDICATION Take 1 tablet by mouth daily as needed. For gas pain      . pimecrolimus (ELIDEL) 1 % cream Apply 1 application topically 2 (two) times daily as needed. For exczema       . prochlorperazine (COMPAZINE) 10 MG tablet Take 10 mg by mouth every 6 (six) hours as needed.        Marland Kitchen PULMICORT FLEXHALER 180 MCG/ACT inhaler INHALE 2 PUFFS BY MOUTH EVERY MORNING  3 Inhaler  0  . tobramycin-dexamethasone (TOBRADEX) ophthalmic solution Place 1 drop into both eyes 2 (two) times daily as needed.        . vitamin B-12 (CYANOCOBALAMIN) 100 MCG tablet Take 2,000 mcg by mouth daily.        No current facility-administered medications on file as of 11/19/2011.       Allergies  Allergen Reactions  . Aspirin     Hx of TTP  . Nsaids     Hx TTP    History   Social History  . Marital Status: Married    Spouse Name: N/A    Number of Children: N/A  . Years of Education: N/A   Occupational History  . Not on file.   Social History Main Topics  . Smoking status: Never Smoker   . Smokeless tobacco: Not on file  . Alcohol Use: 0.6 oz/week    1 Glasses of wine per week  . Drug Use: No  . Sexually Active: Not Currently   Other Topics Concern  . Not on file   Social History Narrative  . No narrative on file    Family History  Problem Relation Age of Onset  . Cancer Maternal Grandfather     brain  . Alcohol abuse Maternal Grandfather   . Heart disease Neg Hx   . Hyperlipidemia Neg Hx   . Hypertension Neg Hx     PHYSICAL EXAM: Filed Vitals:   11/19/11 0953  BP: 124/72  Pulse: 105    No intake or output data in the 24 hours ending 11/19/11 1009  General:  Well appearing. No respiratory  difficulty HEENT: normal. alopecic Neck: supple. no JVD. Carotids 2+ bilat; no bruits. No lymphadenopathy or thryomegaly appreciated. Cor: PMI nondisplaced. Regular rate & rhythm. No rubs or murmurs. +s4 Lungs: clear Abdomen: obese soft, nontender, nondistended. No hepatosplenomegaly. No bruits or masses. Good bowel sounds. Extremities: large. no cyanosis, clubbing, rash, edema Neuro: alert & oriented x 3, cranial nerves grossly intact. moves all 4 extremities w/o difficulty. Affect pleasant.  ECG: Ectopic atrial rhythm 70s. No ST-T wave abnormalities.    ASSESSMENT/PLAN:

## 2011-11-19 NOTE — Assessment & Plan Note (Signed)
No evidence of clinical HF. Suspect due to asthma and obesity. We discussed use of her inhalers. Can consider repeating PFTs and sending to pulmonary to optimize inhaler regimen. Would benefit from exercise and weight loss. Can use diuretics as needed for her diastolic dysfunction.

## 2011-11-19 NOTE — Patient Instructions (Signed)
Your physician has requested that you have an echocardiogram. Echocardiography is a painless test that uses sound waves to create images of your heart. It provides your doctor with information about the size and shape of your heart and how well your heart's chambers and valves are working. This procedure takes approximately one hour. There are no restrictions for this procedure.  NEEDS IN 3 MONTH  Your physician recommends that you schedule a follow-up appointment in: 3 months.

## 2011-11-23 ENCOUNTER — Other Ambulatory Visit: Payer: Managed Care, Other (non HMO) | Admitting: Lab

## 2011-11-23 ENCOUNTER — Encounter: Payer: Self-pay | Admitting: Radiation Oncology

## 2011-11-23 ENCOUNTER — Ambulatory Visit (HOSPITAL_BASED_OUTPATIENT_CLINIC_OR_DEPARTMENT_OTHER): Payer: Managed Care, Other (non HMO) | Admitting: Physician Assistant

## 2011-11-23 ENCOUNTER — Ambulatory Visit: Payer: Managed Care, Other (non HMO) | Admitting: Oncology

## 2011-11-23 ENCOUNTER — Encounter: Payer: Self-pay | Admitting: Physician Assistant

## 2011-11-23 VITALS — BP 109/68 | HR 93 | Temp 97.6°F | Ht 67.0 in | Wt 249.2 lb

## 2011-11-23 DIAGNOSIS — C50919 Malignant neoplasm of unspecified site of unspecified female breast: Secondary | ICD-10-CM

## 2011-11-23 LAB — CBC WITH DIFFERENTIAL/PLATELET
BASO%: 0.2 % (ref 0.0–2.0)
Basophils Absolute: 0 10*3/uL (ref 0.0–0.1)
EOS%: 2.3 % (ref 0.0–7.0)
Eosinophils Absolute: 0.1 10*3/uL (ref 0.0–0.5)
HCT: 35.1 % (ref 34.8–46.6)
HGB: 11.7 g/dL (ref 11.6–15.9)
LYMPH%: 11.4 % — ABNORMAL LOW (ref 14.0–49.7)
MCH: 32.3 pg (ref 25.1–34.0)
MCHC: 33.4 g/dL (ref 31.5–36.0)
MCV: 96.8 fL (ref 79.5–101.0)
MONO#: 0.6 10*3/uL (ref 0.1–0.9)
MONO%: 10.3 % (ref 0.0–14.0)
NEUT#: 4.7 10*3/uL (ref 1.5–6.5)
NEUT%: 75.8 % (ref 38.4–76.8)
Platelets: 164 10*3/uL (ref 145–400)
RBC: 3.62 10*6/uL — ABNORMAL LOW (ref 3.70–5.45)
RDW: 17 % — ABNORMAL HIGH (ref 11.2–14.5)
WBC: 6.3 10*3/uL (ref 3.9–10.3)
lymph#: 0.7 10*3/uL — ABNORMAL LOW (ref 0.9–3.3)

## 2011-11-23 LAB — COMPREHENSIVE METABOLIC PANEL
ALT: 68 U/L — ABNORMAL HIGH (ref 0–35)
AST: 37 U/L (ref 0–37)
Albumin: 3.3 g/dL — ABNORMAL LOW (ref 3.5–5.2)
Alkaline Phosphatase: 90 U/L (ref 39–117)
BUN: 10 mg/dL (ref 6–23)
CO2: 29 mEq/L (ref 19–32)
Calcium: 9.2 mg/dL (ref 8.4–10.5)
Chloride: 98 mEq/L (ref 96–112)
Creatinine, Ser: 0.71 mg/dL (ref 0.50–1.10)
Glucose, Bld: 113 mg/dL — ABNORMAL HIGH (ref 70–99)
Potassium: 4.2 mEq/L (ref 3.5–5.3)
Sodium: 136 mEq/L (ref 135–145)
Total Bilirubin: 0.7 mg/dL (ref 0.3–1.2)
Total Protein: 6.4 g/dL (ref 6.0–8.3)

## 2011-11-23 NOTE — Progress Notes (Signed)
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 from [place]. Husband a Magazine features editor. GXP0.   Status post left breast lumpectomy and sentinel lymph node sampling 08/03/11 for T1CN0, stage I invasive ductal carcinoma, triple positive. Completed four treatments of docetaxel/cyclophosphamide/trastuzumab on 11/16/2011. Will continue Trastuzumab for total of one year. Schedule to see Dr. Dayton Scrape to discuss upcoming radiation treatment.   AX: aspirin and NSAIDS No hx of radiation therapy  No indication of a pacemaker

## 2011-11-23 NOTE — Progress Notes (Signed)
ID: [name]   DOB: 04/12/1956  MR#: [id number]  NFA#:[id number]  HISTORY OF PRESENT ILLNESS: Tara [name] noted a mass in her left breast and brought it to her physician's attention in [place], where she was residing until recently. She was set up for mammography 06/24/2011 at [facility] in [place], [place]. This showed a suspicious abnormality in the left breast measuring approximately 10 mm. Physical examination showed a 1 cm firm superficial mass in the axillary region of the breast and ultrasound showed this to be 11 mm and spiculated.  Biopsy was performed June 25, 2011, and showed ([id number] at the [facility]) and invasive ductal carcinoma, grade 3, estrogen receptor 95% positive, progesterone receptor 40% positive, with an MIB-1 of 29%. The tumor is HER 2 positive by FISH with a ratio of 2.5. There is tumor heterogeneity noted.  With this information the case was presented Jul 08, 2011 at the multidisciplinary breast cancer conference and ithe patient was evaluated that afternoon at the multidisciplinary breast cancer clinic. She proceeded to definitive left lumpectomy and sentinel lymph node sampling 08/03/2011. This confirmed a 1.6 cm invasive ductal carcinoma, grade 3. Margins were ample. The two sentinel lymph nodes were clear.  The decision was made to proceed with 4 cycles of adjuvant chemotherapy consisting of docetaxel/cyclophosphamide given with trastuzumab every 3 weeks with Neulasta on day 2 for granulocyte support. Trastuzumab will be continued for total of one year.  INTERVAL HISTORY: Tara [name] returns today accompanied by her husband for assessment of chemotoxicity, day 8 cycle 4 of 4 planned q. three-week doses of docetaxel/cyclophosphamide/trastuzumab given in the adjuvant setting for her left breast carcinoma. She receives Neulasta on day 2 for granulocyte support.   REVIEW OF SYSTEMS: Tara [name] has tolerated chemotherapy strongly well, but is relieved to  have finished. She's had no increased signs of peripheral neuropathy in either the upper or lower extremities. She's had no problems with nausea or emesis. She has had some slight diarrhea this week which is resolving. She's had no blood in the stool and no rectal bleeding. She still has some shortness of breath with exertion and utilizes her "rescue inhaler" appropriately and affectively. She denies any cough, phlegm production, fevers, chills, or night sweats. She also denies any mouth ulcers. No skin changes.  A detailed review of systems is otherwise noncontributory.    PAST MEDICAL HISTORY: Past Medical History  Diagnosis Date  . Thrombocytopenia, primary   . Hepatitis C   . Asthma   . Arthritis   . Complication of anesthesia     difficulty waking up/dizzy/lightheaded  . Environmental allergies   . Shortness of breath   . Hypothyroidism   . TTP (thrombotic thrombocytopenic purpura)   . Blood transfusion   . Anxiety   . Dysrhythmia     irregular heartbeat, takes digoxin  . Chronic edema   . Breast cancer 2012    left breast    PAST SURGICAL HISTORY: Past Surgical History  Procedure Date  . Elbow pins     left  . Right hip replacement   . Left foot reconstruction   . Ortho left foot surg    . Joint replacement     bilat hip  . Portacath placement 08/03/2011    Procedure: INSERTION PORT-A-CATH;  Surgeon: Robyne Askew, MD;  Location: [facility] OR;  Service: General;  Laterality: Right;  . Breast lumpectomy 08/03/11    left lumpectomy and slnbx,T1cN0,triple pos    FAMILY HISTORY Family History  Problem  Relation Age of Onset  . Cancer Maternal Grandfather     brain  . Alcohol abuse Maternal Grandfather   . Heart disease Neg Hx   . Hyperlipidemia Neg Hx   . Hypertension Neg Hx     Gynecologic history: She is GX P0 menarche age 56 most recent period July of 2008 she never took hormone replacement   Social History: the patient is a homemaker; she used to work in an  office previously. Her husband [name] is a Magazine features editor. They moved here from [facility] Oct 2012.    ADVANCED DIRECTIVES:  HEALTH MAINTENANCE: History  Substance Use Topics  . Smoking status: Never Smoker   . Smokeless tobacco: Never Used  . Alcohol Use: 0.6 oz/week    1 Glasses of wine per week     Colonoscopy:  PAP:  Bone density:  Lipid panel:  Allergies  Allergen Reactions  . Aspirin     Hx of TTP  . Nsaids     Hx TTP    Current Outpatient Prescriptions  Medication Sig Dispense Refill  . acetaminophen (TYLENOL) 500 MG tablet Take 500 mg by mouth every 6 (six) hours as needed. For pain       . albuterol (PROVENTIL HFA;VENTOLIN HFA) 108 (90 BASE) MCG/ACT inhaler Inhale 2 puffs into the lungs every 6 (six) hours as needed. For shortness of breath       . cephALEXin (KEFLEX) 500 MG capsule       . cholecalciferol (VITAMIN D) 1000 UNITS tablet Take 2,000 Units by mouth daily.        . citalopram (CELEXA) 20 MG tablet Take 20 mg by mouth daily.       . clobetasol ointment (TEMOVATE) 0.05 % Apply 1 application topically daily as needed. For psoraisis       . dexamethasone (DECADRON) 4 MG tablet Take 2 tablets two times a day the day before Taxotere. Then take 2 tabs two times a day starting the day after chemo for 3 days.  30 tablet  1  . digoxin (LANOXIN) 0.25 MG tablet Take 250 mcg by mouth daily.       . fluticasone (FLONASE) 50 MCG/ACT nasal spray Place 2 sprays into the nose daily as needed. For congestion      . glucosamine-chondroitin 500-400 MG tablet Take 1 tablet by mouth daily.       . Halcinonide (HALOG) 0.1 % CREA Apply 1 application topically daily as needed. For eczema      . levothyroxine (SYNTHROID, LEVOTHROID) 50 MCG tablet TAKE 1 TABLET BY MOUTH EVERY DAY  30 tablet  1  . lidocaine-prilocaine (EMLA) cream Apply topically as needed.        . loperamide (IMODIUM A-D) 2 MG tablet Take 2 mg by mouth 4 (four) times daily as needed. For diarrhea       . metroNIDAZOLE  (METROCREAM) 0.75 % cream Apply topically 2 (two) times daily.  45 g  2  . montelukast (SINGULAIR) 10 MG tablet Take 10 mg by mouth at bedtime.        . ondansetron (ZOFRAN) 8 MG tablet Take 1 tablet (8 mg total) by mouth every 12 (twelve) hours as needed for nausea.  30 tablet  2  . OVER THE COUNTER MEDICATION Take 1 tablet by mouth daily as needed. For gas pain      . pimecrolimus (ELIDEL) 1 % cream Apply 1 application topically 2 (two) times daily as needed. For exczema       .  prochlorperazine (COMPAZINE) 10 MG tablet Take 10 mg by mouth every 6 (six) hours as needed.        Marland Kitchen PULMICORT FLEXHALER 180 MCG/ACT inhaler INHALE 2 PUFFS BY MOUTH EVERY MORNING  3 Inhaler  0  . tobramycin-dexamethasone (TOBRADEX) ophthalmic solution Place 1 drop into both eyes 2 (two) times daily as needed.        . vitamin B-12 (CYANOCOBALAMIN) 100 MCG tablet Take 2,000 mcg by mouth daily.         OBJECTIVE: Filed Vitals:   11/23/11 1413  BP: 109/68  Pulse: 93  Temp: 97.6 F (36.4 C)     Body mass index is 39.03 kg/(m^2).    ECOG FS: 0  Physical Exam: HEENT:  Sclerae anicteric, conjunctivae pink.  Oropharynx clear.  No mucositis or candidiasis.   Nodes:  No cervical, supraclavicular, or axillary lymphadenopathy palpated.  Breast Exam:  Right breast, unremarkable with no masses, skin changes, or nipple inversion. Left breast is status post lumpectomy with well-healed incision. No specialist nodularity or skin changes, and no evidence of local recurrence. Lungs:  Clear to auscultation bilaterally.  No crackles, rhonchi, or wheezes.   Heart:  Regular rate and rhythm.   Abdomen:  Soft, obese, nontender.  Positive bowel sounds.  No organomegaly or masses palpated.   Musculoskeletal:  No focal spinal tenderness to palpation.  Extremities:  Benign.  No peripheral edema or cyanosis.   Skin:  Benign.   Neuro:  Nonfocal.    LAB RESULTS: Lab Results  Component Value Date   WBC 6.3 11/23/2011   NEUTROABS 4.7  11/23/2011   HGB 11.7 11/23/2011   HCT 35.1 11/23/2011   MCV 96.8 11/23/2011   PLT 164 11/23/2011      Chemistry      Component Value Date/Time   NA 140 11/16/2011 1011   K 4.5 11/16/2011 1011   CL 102 11/16/2011 1011   CO2 27 11/16/2011 1011   BUN 13 11/16/2011 1011   CREATININE 0.60 11/16/2011 1011      Component Value Date/Time   CALCIUM 9.0 11/16/2011 1011   ALKPHOS 66 11/16/2011 1011   AST 41* 11/16/2011 1011   ALT 63* 11/16/2011 1011   BILITOT 0.6 11/16/2011 1011       Lab Results  Component Value Date   LABCA2 29 07/08/2011     STUDIES: 2-D echocardiogram on 10/13/11 showed an ejection fraction of 60%.   ASSESSMENT: 56 year old [place] woman originally from [place]  1. Status post left lumpectomy and sentinel lymph node sampling 08/03/2011 for a T1C N0 (Stage I) invasive ductal carcinoma, high-grade, triple positive.  2. Being treated in the adjuvant setting with 4 plan doses of docetaxel and cyclophosphamide given along with trastuzumab on a q. 3 week basis, with Neulasta on day 2 for granulocyte support.  3. Trastuzumab to be continued for total of one year  4. Patient will need radiation therapy following chemotherapy.  5. Comorbidities include a history of remote thrombotic thrombocytopenic purpura and hepatitis C.    PLAN: Tara [name] is pleased to have completed her adjuvant chemotherapy. She is scheduled to meet with Dr. Dayton Scrape tomorrow to discuss radiation therapy. Of course we will be continuing  trastuzumab on a Q. three-week basis and she is scheduled for April 8, then again on April 29. Following radiation, she'll be started on oral anti-estrogen therapy. Dr. Darnelle Catalan will review this with her further when he sees her on April 29.  Patient voices understanding and agreement with this plan,  and will call with any changes or problems.   Osmani Kersten    11/23/2011

## 2011-11-24 ENCOUNTER — Ambulatory Visit
Admission: RE | Admit: 2011-11-24 | Discharge: 2011-11-24 | Disposition: A | Discharge status: Home / Self Care | Payer: Managed Care, Other (non HMO) | Source: Ambulatory Visit | Attending: Radiation Oncology | Admitting: Radiation Oncology

## 2011-11-24 ENCOUNTER — Encounter: Payer: Self-pay | Admitting: Radiation Oncology

## 2011-11-24 VITALS — BP 120/80 | HR 84 | Temp 97.9°F | Resp 20 | Wt 248.5 lb

## 2011-11-24 DIAGNOSIS — C50919 Malignant neoplasm of unspecified site of unspecified female breast: Secondary | ICD-10-CM

## 2011-11-24 DIAGNOSIS — I890 Lymphedema, not elsewhere classified: Secondary | ICD-10-CM | POA: Insufficient documentation

## 2011-11-24 NOTE — Progress Notes (Signed)
Followup note:  Diagnosis: Stage I (T1, N0, M0) invasive ductal/DCIS of the left breast  The patient returns today for review and scheduling of her left breast radiation therapy. To review, she is found to have a 1.6 cm invasive ductal carcinoma involving the axillary tail of the left breast. She also had DCIS, intermediate to high-grade with focal central necrosis. Her invasive disease was also high grade. Dr. Carolynne Edouard performed a left partial mastectomy and sentinel lymph node biopsy on 08/03/2011. 2 sentinel lymph nodes were negative for metastatic disease. Her closest margin was 0.5 cm for her invasive disease and 0.5 cm for her in situ disease. She will on to receive 4 cycles of Herceptin based adjuvant chemotherapy (docetaxel/cyclophosphamide) which finished 1 week ago. Her blood counts are satisfactory and her chemotherapy was well tolerated.  Physical examination: Alert and oriented. Head and neck examination remarkable for alopecia. Nodes: Without palpable cervical, supraclavicular, or axillary lymphadenopathy. Chest: Lungs clear. Breasts: There is a partial mastectomy scar along the upper-outer quadrant of the left breast towards the tail. No masses are appreciated. Her breasts are large. Right breast without masses or lesions. Abdomen: Obese. Extremities: Without obvious edema.  Laboratory data: Lab Results  Component Value Date   WBC 6.3 11/23/2011   HGB 11.7 11/23/2011   HCT 35.1 11/23/2011   MCV 96.8 11/23/2011   PLT 164 11/23/2011   Impression: Stage I (T1, N0, M0) invasive ductal/DCIS of left breast. I again discussed the potential acute and late toxicities of region therapy and she wishes to proceed as outlined. Consent was previously signed. She will return for treatment planning the week of April 8. from a technical standpoint, she'll be treated prone to avoid skin toxicity. We need to  make sure that we can add recover her tumor bed along the tail of the breast.  30 minutes was spent  face-to-face with the patient, primarily counseling the patient.

## 2011-11-24 NOTE — Progress Notes (Signed)
FUNC Left Breast Cancer Lumpectomt Invasive ductal ca DCIS + 4 cycles  Adjuvant chemotherapy  Doxetaxel/cycl;ophosamide given with trastuzamb  Every 3 weeks with neulasta  On day 2, competed 11/23/11, ,will now just do the trastuzamb for 1 year  Alert oriented x 3, voiced no c/o pain or discomfort, fair appetite, taste  Not so good, slight stomach ache stated   Allergies:asa.nsaids, HX TTP

## 2011-11-25 NOTE — Progress Notes (Signed)
Encounter addended by: Agnes Lawrence, RN on: 11/25/2011 11:05 AM<BR>     Documentation filed: Charges VN, Inpatient Patient Education

## 2011-11-26 ENCOUNTER — Encounter: Payer: Self-pay | Admitting: Oncology

## 2011-11-26 NOTE — Progress Notes (Signed)
Faxed clinical information to [employer] attn: Lavinia Sharps, oncology case manager, @ [phone].

## 2011-12-07 ENCOUNTER — Other Ambulatory Visit (HOSPITAL_BASED_OUTPATIENT_CLINIC_OR_DEPARTMENT_OTHER): Payer: Managed Care, Other (non HMO) | Admitting: Lab

## 2011-12-07 ENCOUNTER — Ambulatory Visit
Admission: RE | Admit: 2011-12-07 | Discharge: 2011-12-07 | Disposition: A | Discharge status: Home / Self Care | Payer: Managed Care, Other (non HMO) | Source: Ambulatory Visit | Attending: Radiation Oncology | Admitting: Radiation Oncology

## 2011-12-07 ENCOUNTER — Ambulatory Visit (HOSPITAL_BASED_OUTPATIENT_CLINIC_OR_DEPARTMENT_OTHER): Payer: Managed Care, Other (non HMO)

## 2011-12-07 ENCOUNTER — Encounter: Payer: Self-pay | Admitting: Radiation Oncology

## 2011-12-07 VITALS — BP 105/59 | HR 75 | Temp 98.0°F

## 2011-12-07 DIAGNOSIS — C50919 Malignant neoplasm of unspecified site of unspecified female breast: Secondary | ICD-10-CM

## 2011-12-07 DIAGNOSIS — Z5112 Encounter for antineoplastic immunotherapy: Secondary | ICD-10-CM

## 2011-12-07 LAB — CBC WITH DIFFERENTIAL/PLATELET
BASO%: 0.7 % (ref 0.0–2.0)
Basophils Absolute: 0.1 10*3/uL (ref 0.0–0.1)
EOS%: 2.2 % (ref 0.0–7.0)
Eosinophils Absolute: 0.2 10*3/uL (ref 0.0–0.5)
HCT: 33.7 % — ABNORMAL LOW (ref 34.8–46.6)
HGB: 11.1 g/dL — ABNORMAL LOW (ref 11.6–15.9)
LYMPH%: 13.4 % — ABNORMAL LOW (ref 14.0–49.7)
MCH: 31.6 pg (ref 25.1–34.0)
MCHC: 32.9 g/dL (ref 31.5–36.0)
MCV: 96 fL (ref 79.5–101.0)
MONO#: 1.1 10*3/uL — ABNORMAL HIGH (ref 0.1–0.9)
MONO%: 14.8 % — ABNORMAL HIGH (ref 0.0–14.0)
NEUT#: 5.3 10*3/uL (ref 1.5–6.5)
NEUT%: 68.9 % (ref 38.4–76.8)
Platelets: 123 10*3/uL — ABNORMAL LOW (ref 145–400)
RBC: 3.51 10*6/uL — ABNORMAL LOW (ref 3.70–5.45)
RDW: 17.6 % — ABNORMAL HIGH (ref 11.2–14.5)
WBC: 7.7 10*3/uL (ref 3.9–10.3)
lymph#: 1 10*3/uL (ref 0.9–3.3)
nRBC: 0 % (ref 0–0)

## 2011-12-07 MED ORDER — SODIUM CHLORIDE 0.9 % IJ SOLN
10.0000 mL | INTRAMUSCULAR | Status: DC | PRN
  Administered 2011-12-07: 10 mL
  Filled 2011-12-07: qty 10

## 2011-12-07 MED ORDER — TRASTUZUMAB CHEMO INJECTION 440 MG
6.0000 mg/kg | Freq: Once | INTRAVENOUS | Status: AC
  Administered 2011-12-07: 693 mg via INTRAVENOUS
  Filled 2011-12-07: qty 33

## 2011-12-07 MED ORDER — ACETAMINOPHEN 325 MG PO TABS
650.0000 mg | ORAL_TABLET | Freq: Once | ORAL | Status: AC
  Administered 2011-12-07: 650 mg via ORAL

## 2011-12-07 MED ORDER — DIPHENHYDRAMINE HCL 25 MG PO CAPS
25.0000 mg | ORAL_CAPSULE | Freq: Once | ORAL | Status: AC
  Administered 2011-12-07: 25 mg via ORAL

## 2011-12-07 MED ORDER — HEPARIN SOD (PORK) LOCK FLUSH 100 UNIT/ML IV SOLN
500.0000 [IU] | Freq: Once | INTRAVENOUS | Status: AC | PRN
  Administered 2011-12-07: 500 [IU]
  Filled 2011-12-07: qty 5

## 2011-12-07 MED ORDER — SODIUM CHLORIDE 0.9 % IV SOLN
Freq: Once | INTRAVENOUS | Status: AC
  Administered 2011-12-07: 13:00:00 via INTRAVENOUS

## 2011-12-07 NOTE — Progress Notes (Signed)
Simulation/treatment planning note:  The patient underwent CT simulation/treatment planning in the management of her carcinoma of the left breast. She is placed on a prone breast board. Her upper outer quadrant partial mastectomy wound was marked with a radiopaque wire. She was then scanned. She is setup the medial and lateral left breast changes. 2 separate multileaf collimators were designed to conform the field and avoid the normal surrounding structures. I prescribing 4680 cGy in 26 sessions utilizing mixed energy photons. An isodose plan and dosimetry are requested. This be followed by a reduced field boost to her tumor bed for a further 1400 cGy in 7 sessions.

## 2011-12-07 NOTE — Progress Notes (Signed)
Met with patient to discuss RO billing.  Patient concerned about cost since husband is unemployed.  Gave her a preliminary financial assessment form to fill out and return to see if they qualify for assistance.  Will return at next visit.  Patient has not ret'd preliminary assessment form.  May have decided not to apply at this time.

## 2011-12-07 NOTE — Progress Notes (Signed)
[employer] ([employer]) paperwork to Dr. Dayton Scrape to complete for pre-cert.  12/08/11 1:20pm  Spoke to [facility] - tx approved (102 days) expires 03/05/12 #[id number]

## 2011-12-07 NOTE — Patient Instructions (Signed)
Pt aware of next appt. dc'd ambulatory with husband. Instructed to call if any questions

## 2011-12-09 ENCOUNTER — Other Ambulatory Visit: Payer: Self-pay | Admitting: Physician Assistant

## 2011-12-14 ENCOUNTER — Encounter: Payer: Self-pay | Admitting: Radiation Oncology

## 2011-12-14 ENCOUNTER — Ambulatory Visit
Admission: RE | Admit: 2011-12-14 | Discharge: 2011-12-14 | Disposition: A | Discharge status: Home / Self Care | Payer: Managed Care, Other (non HMO) | Source: Ambulatory Visit | Attending: Radiation Oncology | Admitting: Radiation Oncology

## 2011-12-14 NOTE — Progress Notes (Signed)
Simulation verification note: The patient underwent similar to verification for treatment to her breast. Her isocenter needs to be moved anteriorly 0.5 cm. Otherwise, the multileaf collimators contoured the treatment volume appropriate.

## 2011-12-15 ENCOUNTER — Ambulatory Visit
Admission: RE | Admit: 2011-12-15 | Discharge: 2011-12-15 | Disposition: A | Discharge status: Home / Self Care | Payer: Managed Care, Other (non HMO) | Source: Ambulatory Visit | Attending: Radiation Oncology | Admitting: Radiation Oncology

## 2011-12-15 DIAGNOSIS — I890 Lymphedema, not elsewhere classified: Secondary | ICD-10-CM

## 2011-12-15 DIAGNOSIS — C50919 Malignant neoplasm of unspecified site of unspecified female breast: Secondary | ICD-10-CM

## 2011-12-15 HISTORY — DX: Lymphedema, not elsewhere classified: I89.0

## 2011-12-15 MED ORDER — RADIAPLEXRX EX GEL
Freq: Once | CUTANEOUS | Status: AC
  Administered 2011-12-15: 15:00:00 via TOPICAL

## 2011-12-15 MED ORDER — ALRA NON-METALLIC DEODORANT (RAD-ONC)
1.0000 "application " | Freq: Once | TOPICAL | Status: AC
  Administered 2011-12-15: 1 via TOPICAL

## 2011-12-15 NOTE — Progress Notes (Signed)
Weekly Management Note:  Site:L Breast Current Dose:  180  cGy Projected Dose: 6080  cGy  Narrative: The patient is seen today for routine under treatment assessment. CBCT/MVCT images/port films were reviewed. The chart was reviewed.   She noted swelling along her left arm beginning just over one week ago. She's had difficulty getting her rings off .   Physical Examination: There were no vitals filed for this visit..  Weight:  . There is 1-2+ lymphedema of the left upper extremity.  Impression: Tolerating radiation therapy well, however, she is developing lymphedema.  Plan: Continue radiation therapy as planned. We will get her seen by the lymphedema therapist.

## 2011-12-15 NOTE — Progress Notes (Signed)
Post sim teaching, radiation therapy and you book, alra deodorant, and radiaplex gel given, discussed s/s, s/e to report, noticed left arm swelling, 1st tx, may need referral to lymphedema clinic, will let MD now, no c/o pain, sorta numb in back after surgery, " , no c/o pain, alert,oriented x 3, steady gait, does have fatigue ,some better not a whole lot stated patient 2:31 PM

## 2011-12-16 ENCOUNTER — Ambulatory Visit
Admission: RE | Admit: 2011-12-16 | Discharge: 2011-12-16 | Disposition: A | Discharge status: Home / Self Care | Payer: Managed Care, Other (non HMO) | Source: Ambulatory Visit | Attending: Radiation Oncology | Admitting: Radiation Oncology

## 2011-12-17 ENCOUNTER — Ambulatory Visit
Admission: RE | Admit: 2011-12-17 | Discharge: 2011-12-17 | Disposition: A | Discharge status: Home / Self Care | Payer: Managed Care, Other (non HMO) | Source: Ambulatory Visit | Attending: Radiation Oncology | Admitting: Radiation Oncology

## 2011-12-18 ENCOUNTER — Ambulatory Visit
Admission: RE | Admit: 2011-12-18 | Discharge: 2011-12-18 | Disposition: A | Discharge status: Home / Self Care | Payer: Managed Care, Other (non HMO) | Source: Ambulatory Visit | Attending: Radiation Oncology | Admitting: Radiation Oncology

## 2011-12-21 ENCOUNTER — Ambulatory Visit
Admission: RE | Admit: 2011-12-21 | Discharge: 2011-12-21 | Disposition: A | Discharge status: Home / Self Care | Payer: Managed Care, Other (non HMO) | Source: Ambulatory Visit | Attending: Radiation Oncology | Admitting: Radiation Oncology

## 2011-12-21 ENCOUNTER — Ambulatory Visit (INDEPENDENT_AMBULATORY_CARE_PROVIDER_SITE_OTHER): Payer: Managed Care, Other (non HMO) | Admitting: General Surgery

## 2011-12-21 ENCOUNTER — Encounter: Payer: Self-pay | Admitting: Radiation Oncology

## 2011-12-21 ENCOUNTER — Encounter (INDEPENDENT_AMBULATORY_CARE_PROVIDER_SITE_OTHER): Payer: Self-pay | Admitting: General Surgery

## 2011-12-21 VITALS — BP 130/73 | HR 96 | Temp 96.4°F | Ht 68.0 in | Wt 261.4 lb

## 2011-12-21 VITALS — BP 128/68 | HR 101 | Resp 18 | Wt 262.9 lb

## 2011-12-21 DIAGNOSIS — C50919 Malignant neoplasm of unspecified site of unspecified female breast: Secondary | ICD-10-CM

## 2011-12-21 NOTE — Progress Notes (Signed)
Weekly Management Note:  Site:L Breast Current Dose:  900  cGy Projected Dose: 6080  cGy  Narrative: The patient is seen today for routine under treatment assessment. CBCT/MVCT images/port films were reviewed. The chart was reviewed.   She is without complaints today except for some dysphagia which she feels may related to a chronic esophageal stricture. She does not see the lymphedema clinic therapist until May 6. She uses Radioplex gel.  Physical Examination:  Filed Vitals:   12/21/11 1335  BP: 128/68  Pulse: 101  Resp: 18  .  Weight: 262 lb 14.4 oz (119.251 kg). No significant skin changes along left breast. Her left upper extremity lymphedema is unchanged.  Impression: Tolerating radiation therapy well.  Plan: Continue radiation therapy as planned.

## 2011-12-21 NOTE — Progress Notes (Signed)
Subjective:     Patient ID: Tara [name], female   DOB: [birth year], 56 y.o.   MRN: [id number]  HPI The patient is a 56 year old white female who is 5 months out from a left breast lumpectomy and negative sentinel node biopsy for a T1 C. N0 left breast cancer. She was triple positive. Since her last visit she has finished her chemotherapy and has just started on radiation therapy. She still giving her monthly Herceptin. She will start antiestrogen therapy once the radiation was finished. She otherwise feels good. She is scheduled to have a 15 for a compression sleeve for her left arm since she has developed some lymphedema.  Review of Systems  Constitutional: Negative.   HENT: Negative.   Eyes: Negative.   Respiratory: Negative.   Cardiovascular: Negative.   Gastrointestinal: Negative.   Genitourinary: Negative.   Musculoskeletal: Negative.   Skin: Negative.   Neurological: Negative.   Hematological: Negative.   Psychiatric/Behavioral: Negative.        Objective:   Physical Exam  Constitutional: She is oriented to person, place, and time. She appears well-developed and well-nourished.  HENT:  Head: Normocephalic and atraumatic.  Eyes: Conjunctivae and EOM are normal. Pupils are equal, round, and reactive to light.  Neck: Normal range of motion. Neck supple.  Cardiovascular: Normal rate, regular rhythm and normal heart sounds.   Pulmonary/Chest: Effort normal and breath sounds normal.       Left breast and axillary incision is healed nicely. There is no sign of infection or seroma. No palpable mass in either breast. No axillary supraclavicular or cervical lymphadenopathy.  Abdominal: Soft. Bowel sounds are normal. She exhibits no mass. There is no tenderness.  Musculoskeletal: Normal range of motion.  Neurological: She is alert and oriented to person, place, and time.  Skin: Skin is warm and dry.  Psychiatric: She has a normal mood and affect. Her behavior is normal.         Assessment:     5 months status post left lumpectomy and negative sentinel node biopsy    Plan:     At this point she will continue with her scheduled treatments. We will plan to see her back in about 3 months the

## 2011-12-21 NOTE — Progress Notes (Signed)
Patient presents to the clinic today unaccompanied for under treat visit with Dr. Dayton Scrape. Patient is alert and oriented to person, place, and time. No distress noted. Steady gait noted. Pleasant affect noted. Patient denies pain at this time. Patient reports an occasional sharp shooting pain in her left/treated breast. Patient denies skin changes to the treated breast. Patient reports using Radiaplex or Alra as directed. Patient denies nausea, dizziness, or headache. Patient reports occasional diarrhea. Patient reports emesis today following lunch but relates this to esophageal stricture. Reported all findings to Dr. Dayton Scrape.

## 2011-12-22 ENCOUNTER — Ambulatory Visit
Admission: RE | Admit: 2011-12-22 | Discharge: 2011-12-22 | Disposition: A | Discharge status: Home / Self Care | Payer: Managed Care, Other (non HMO) | Source: Ambulatory Visit | Attending: Radiation Oncology | Admitting: Radiation Oncology

## 2011-12-23 ENCOUNTER — Ambulatory Visit
Admission: RE | Admit: 2011-12-23 | Discharge: 2011-12-23 | Disposition: A | Discharge status: Home / Self Care | Payer: Managed Care, Other (non HMO) | Source: Ambulatory Visit | Attending: Radiation Oncology | Admitting: Radiation Oncology

## 2011-12-24 ENCOUNTER — Ambulatory Visit
Admission: RE | Admit: 2011-12-24 | Discharge: 2011-12-24 | Disposition: A | Discharge status: Home / Self Care | Payer: Managed Care, Other (non HMO) | Source: Ambulatory Visit | Attending: Radiation Oncology | Admitting: Radiation Oncology

## 2011-12-25 ENCOUNTER — Ambulatory Visit
Admission: RE | Admit: 2011-12-25 | Discharge: 2011-12-25 | Disposition: A | Discharge status: Home / Self Care | Payer: Managed Care, Other (non HMO) | Source: Ambulatory Visit | Attending: Radiation Oncology | Admitting: Radiation Oncology

## 2011-12-28 ENCOUNTER — Ambulatory Visit
Admission: RE | Admit: 2011-12-28 | Discharge: 2011-12-28 | Disposition: A | Discharge status: Home / Self Care | Payer: Managed Care, Other (non HMO) | Source: Ambulatory Visit | Attending: Radiation Oncology | Admitting: Radiation Oncology

## 2011-12-28 ENCOUNTER — Ambulatory Visit: Payer: Managed Care, Other (non HMO) | Admitting: Oncology

## 2011-12-28 ENCOUNTER — Encounter: Payer: Self-pay | Admitting: Radiation Oncology

## 2011-12-28 ENCOUNTER — Other Ambulatory Visit: Payer: Managed Care, Other (non HMO) | Admitting: Lab

## 2011-12-28 ENCOUNTER — Ambulatory Visit (HOSPITAL_BASED_OUTPATIENT_CLINIC_OR_DEPARTMENT_OTHER): Payer: Managed Care, Other (non HMO)

## 2011-12-28 VITALS — BP 129/88 | HR 84 | Temp 98.3°F | Ht 68.0 in | Wt 259.5 lb

## 2011-12-28 VITALS — BP 123/83 | HR 83 | Wt 260.1 lb

## 2011-12-28 DIAGNOSIS — C50919 Malignant neoplasm of unspecified site of unspecified female breast: Secondary | ICD-10-CM

## 2011-12-28 DIAGNOSIS — Z5112 Encounter for antineoplastic immunotherapy: Secondary | ICD-10-CM

## 2011-12-28 LAB — CBC WITH DIFFERENTIAL/PLATELET
BASO%: 1 % (ref 0.0–2.0)
Basophils Absolute: 0 10*3/uL (ref 0.0–0.1)
EOS%: 10.6 % — ABNORMAL HIGH (ref 0.0–7.0)
Eosinophils Absolute: 0.5 10*3/uL (ref 0.0–0.5)
HCT: 37.3 % (ref 34.8–46.6)
HGB: 12.4 g/dL (ref 11.6–15.9)
LYMPH%: 11.9 % — ABNORMAL LOW (ref 14.0–49.7)
MCH: 32.6 pg (ref 25.1–34.0)
MCHC: 33.2 g/dL (ref 31.5–36.0)
MCV: 98.2 fL (ref 79.5–101.0)
MONO#: 0.6 10*3/uL (ref 0.1–0.9)
MONO%: 10.9 % (ref 0.0–14.0)
NEUT#: 3.4 10*3/uL (ref 1.5–6.5)
NEUT%: 65.6 % (ref 38.4–76.8)
Platelets: 115 10*3/uL — ABNORMAL LOW (ref 145–400)
RBC: 3.8 10*6/uL (ref 3.70–5.45)
RDW: 16.6 % — ABNORMAL HIGH (ref 11.2–14.5)
WBC: 5.1 10*3/uL (ref 3.9–10.3)
lymph#: 0.6 10*3/uL — ABNORMAL LOW (ref 0.9–3.3)
nRBC: 0 % (ref 0–0)

## 2011-12-28 LAB — COMPREHENSIVE METABOLIC PANEL
ALT: 68 U/L — ABNORMAL HIGH (ref 0–35)
AST: 69 U/L — ABNORMAL HIGH (ref 0–37)
Albumin: 3.8 g/dL (ref 3.5–5.2)
Alkaline Phosphatase: 65 U/L (ref 39–117)
BUN: 12 mg/dL (ref 6–23)
CO2: 28 mEq/L (ref 19–32)
Calcium: 9.2 mg/dL (ref 8.4–10.5)
Chloride: 98 mEq/L (ref 96–112)
Creatinine, Ser: 0.74 mg/dL (ref 0.50–1.10)
Glucose, Bld: 94 mg/dL (ref 70–99)
Potassium: 4.4 mEq/L (ref 3.5–5.3)
Sodium: 137 mEq/L (ref 135–145)
Total Bilirubin: 0.8 mg/dL (ref 0.3–1.2)
Total Protein: 7 g/dL (ref 6.0–8.3)

## 2011-12-28 MED ORDER — DIPHENHYDRAMINE HCL 25 MG PO CAPS
25.0000 mg | ORAL_CAPSULE | Freq: Once | ORAL | Status: AC
  Administered 2011-12-28: 25 mg via ORAL

## 2011-12-28 MED ORDER — ACETAMINOPHEN 325 MG PO TABS
650.0000 mg | ORAL_TABLET | Freq: Once | ORAL | Status: AC
  Administered 2011-12-28: 650 mg via ORAL

## 2011-12-28 MED ORDER — SODIUM CHLORIDE 0.9 % IJ SOLN
10.0000 mL | INTRAMUSCULAR | Status: DC | PRN
  Administered 2011-12-28: 10 mL
  Filled 2011-12-28: qty 10

## 2011-12-28 MED ORDER — HEPARIN SOD (PORK) LOCK FLUSH 100 UNIT/ML IV SOLN
500.0000 [IU] | Freq: Once | INTRAVENOUS | Status: AC | PRN
  Administered 2011-12-28: 500 [IU]
  Filled 2011-12-28: qty 5

## 2011-12-28 MED ORDER — ANASTROZOLE 1 MG PO TABS: 1.0000 mg | ORAL_TABLET | Freq: Every day | ORAL | Status: AC

## 2011-12-28 MED ORDER — TRASTUZUMAB CHEMO INJECTION 440 MG
6.0000 mg/kg | Freq: Once | INTRAVENOUS | Status: AC
  Administered 2011-12-28: 693 mg via INTRAVENOUS
  Filled 2011-12-28: qty 33

## 2011-12-28 MED ORDER — SODIUM CHLORIDE 0.9 % IV SOLN
Freq: Once | INTRAVENOUS | Status: AC
  Administered 2011-12-28: 13:00:00 via INTRAVENOUS

## 2011-12-28 NOTE — Progress Notes (Signed)
Patient presents to the clinic today unaccompanied for an under treat visit with Dr. Dayton Scrape. Patient is alert and oriented to person, place and time. No distress noted. Steady gait noted. Pleasant affect noted. Patient denies pain at this time. Patient reports faint hyperpigmentation of the left/treated breast. Patient reports using Radiaplex as directed. Patient reports she is scheduled for labs and herceptin today with Magrinat. Reported all findings to Dr. Dayton Scrape.

## 2011-12-28 NOTE — Progress Notes (Signed)
ID: [name]   DOB: 23-Feb-1956  MR#: [id number]  WUJ#:[id number]  HISTORY OF PRESENT ILLNESS: Genean noted a mass in her left breast and brought it to her physician's attention in [place], where she was residing. She was set up for mammography 06/24/2011 at [facility] in [place], [place]. This showed a suspicious abnormality in the left breast measuring approximately 10 mm. Physical examination showed a 1 cm firm superficial mass in the axillary region of the breast and ultrasound showed this to be 11 mm and spiculated.  Biopsy was performed June 25, 2011, and showed ([id number] at the [facility]) and invasive ductal carcinoma, grade 3, estrogen receptor 95% positive, progesterone receptor 40% positive, with an MIB-1 of 29%. The tumor is HER 2 positive by FISH with a ratio of 2.5. There is tumor heterogeneity noted.  With this information the case was presented Jul 08, 2011 at the multidisciplinary  breast cancer clinic. She proceeded to definitive left lumpectomy and sentinel lymph node sampling 08/03/2011. This confirmed a 1.6 cm invasive ductal carcinoma, grade 3. Margins were ample. The two sentinel lymph nodes were clear.  Her subsequent history is as detailed below.  INTERVAL HISTORY: Madiha returns today for followup of her breast cancer. She completed her chemotherapy approximately a month ago and is now receiving radiation.  REVIEW OF SYSTEMS: Her energy is picking up. She is going to the gym as often as she can, doing a treadmill and some bicycling. Her hair has just begun growing back, mostly very thin white flashes at present. She is having more asthma symptoms, although she is on appropriate medications for those. Sometimes she gets cramps on her sides. In fact this happened while we were seeing her today. We discussed hydration and low potassium. She's not having fevers, phlegm production, or significant sinus symptoms. Overall a detailed review of systems  was noncontributory and so far she is tolerating radiation well.   PAST MEDICAL HISTORY: Past Medical History  Diagnosis Date  . Thrombocytopenia, primary   . Hepatitis C   . Asthma   . Arthritis   . Complication of anesthesia     difficulty waking up/dizzy/lightheaded  . Environmental allergies   . Shortness of breath   . Hypothyroidism   . TTP (thrombotic thrombocytopenic purpura)   . Blood transfusion   . Anxiety   . Dysrhythmia     irregular heartbeat, takes digoxin  . Chronic edema   . Breast cancer 2012    left breast   PAST SURGICAL HISTORY: Past Surgical History  Procedure Date  . Elbow pins     left  . Right hip replacement   . Left foot reconstruction   . Ortho left foot surg    . Joint replacement     bilat hip  . Portacath placement 08/03/2011    Procedure: INSERTION PORT-A-CATH;  Surgeon: Robyne Askew, MD;  Location: [facility] OR;  Service: General;  Laterality: Right;  . Breast lumpectomy 08/03/11    left lumpectomy and slnbx,T1cN0,triple pos    FAMILY HISTORY Family History  Problem Relation Age of Onset  . Cancer Maternal Grandfather     brain  . Alcohol abuse Maternal Grandfather   . Heart disease Neg Hx   . Hyperlipidemia Neg Hx   . Hypertension Neg Hx   Patient's father died at the age of 57 from emphysema in the setting of tobacco abuse patient's mother is alive at 30 the patient has one brother and one sister there  is no history of breast or ovarian cancer in the immediate family    Gynecologic history: She is GX P0 menarche age 79 most recent period July of 2008 she never took hormone replacement   Social History: the patient is a homemaker; she used to work in an office previously. Her husband [name] is a Magazine features editor. They moved here from [facility] Oct 2012.    ADVANCED DIRECTIVES:  HEALTH MAINTENANCE: History  Substance Use Topics  . Smoking status: Never Smoker   . Smokeless tobacco: Never Used  . Alcohol Use: 0.6 oz/week    1 Glasses of wine  per week     Colonoscopy:  PAP: Oct 2012  Bone density: Nov 2011/ nl  Lipid panel:  Allergies  Allergen Reactions  . Aspirin     Hx of TTP  . Nsaids     Hx TTP    Current Outpatient Prescriptions  Medication Sig Dispense Refill  . acetaminophen (TYLENOL) 500 MG tablet Take 500 mg by mouth every 6 (six) hours as needed. For pain       . albuterol (PROVENTIL HFA;VENTOLIN HFA) 108 (90 BASE) MCG/ACT inhaler Inhale 2 puffs into the lungs every 6 (six) hours as needed. For shortness of breath       . cholecalciferol (VITAMIN D) 1000 UNITS tablet Take 2,000 Units by mouth daily.        . citalopram (CELEXA) 20 MG tablet TAKE 1 TABLET BY MOUTH EVERY DAILY  90 tablet  1  . clobetasol ointment (TEMOVATE) 0.05 % Apply 1 application topically daily as needed. For psoraisis       . digoxin (LANOXIN) 0.25 MG tablet Take 250 mcg by mouth daily.       . fluticasone (FLONASE) 50 MCG/ACT nasal spray Place 2 sprays into the nose daily as needed. For congestion      . glucosamine-chondroitin 500-400 MG tablet Take 1 tablet by mouth daily.       . Halcinonide (HALOG) 0.1 % CREA Apply 1 application topically daily as needed. For eczema      . levothyroxine (SYNTHROID, LEVOTHROID) 50 MCG tablet TAKE 1 TABLET BY MOUTH EVERY DAY  30 tablet  1  . lidocaine-prilocaine (EMLA) cream Apply topically as needed.        . loperamide (IMODIUM A-D) 2 MG tablet Take 2 mg by mouth 4 (four) times daily as needed. For diarrhea       . montelukast (SINGULAIR) 10 MG tablet Take 10 mg by mouth at bedtime.        . non-metallic deodorant Thornton Papas) MISC Apply 1 application topically daily as needed.      Marland Kitchen OVER THE COUNTER MEDICATION Take 1 tablet by mouth daily as needed. For gas pain      . pimecrolimus (ELIDEL) 1 % cream Apply 1 application topically 2 (two) times daily as needed. For exczema       . PULMICORT FLEXHALER 180 MCG/ACT inhaler INHALE 2 PUFFS BY MOUTH EVERY MORNING  3 Inhaler  0  . vitamin B-12 (CYANOCOBALAMIN)  100 MCG tablet Take 2,000 mcg by mouth daily.       . Wound Cleansers (RADIAPLEX EX) Apply topically.      Marland Kitchen anastrozole (ARIMIDEX) 1 MG tablet Take 1 tablet (1 mg total) by mouth daily.  90 tablet  12  . metroNIDAZOLE (METROCREAM) 0.75 % cream Apply topically 2 (two) times daily.  45 g  2  . ondansetron (ZOFRAN) 8 MG tablet Take 1 tablet (8 mg  total) by mouth every 12 (twelve) hours as needed for nausea.  30 tablet  2  . prochlorperazine (COMPAZINE) 10 MG tablet Take 10 mg by mouth every 6 (six) hours as needed.        . tobramycin-dexamethasone (TOBRADEX) ophthalmic solution Place 1 drop into both eyes 2 (two) times daily as needed.          OBJECTIVE: Middle-aged white woman in no acute distress Filed Vitals:   12/28/11 1220  BP: 129/88  Pulse: 84  Temp: 98.3 F (36.8 C)     Body mass index is 39.46 kg/(m^2).    ECOG FS: 1  Physical Exam: HEENT:  Sclerae anicteric, conjunctivae pink.  Oropharynx clear.  No mucositis or candidiasis.   Nodes:  No cervical, supraclavicular, or axillary lymphadenopathy palpated.  Breast Exam:  Right breast, unremarkable with no masses, skin changes, or nipple inversion. Left breast is status post lumpectomy with well-healed incision. No  evidence of local recurrence. Lungs:  Clear to auscultation bilaterally.  No crackles, rhonchi, or wheezes.   Heart:  Regular rate and rhythm.   Abdomen:  Soft, obese, nontender.  Positive bowel sounds.  No organomegaly or masses palpated.   Musculoskeletal:  No focal spinal tenderness to palpation.  Extremities:  Benign.  No peripheral edema or cyanosis.   Neuro:  Nonfocal.    LAB RESULTS: Lab Results  Component Value Date   WBC 5.1 12/28/2011   NEUTROABS 3.4 12/28/2011   HGB 12.4 12/28/2011   HCT 37.3 12/28/2011   MCV 98.2 12/28/2011   PLT 115* 12/28/2011      Chemistry      Component Value Date/Time   NA 136 11/23/2011 1336   K 4.2 11/23/2011 1336   CL 98 11/23/2011 1336   CO2 29 11/23/2011 1336   BUN 10  11/23/2011 1336   CREATININE 0.71 11/23/2011 1336      Component Value Date/Time   CALCIUM 9.2 11/23/2011 1336   ALKPHOS 90 11/23/2011 1336   AST 37 11/23/2011 1336   ALT 68* 11/23/2011 1336   BILITOT 0.7 11/23/2011 1336       Lab Results  Component Value Date   LABCA2 29 07/08/2011     STUDIES: 2-D echocardiogram on 10/13/11 showed an ejection fraction of 60%. Next echo scheduled for 01/19/2012   ASSESSMENT: 56 year old [place] woman originally from [place]   1. Status post left lumpectomy and sentinel lymph node sampling 08/03/2011 for a T1C N0 (Stage I) invasive ductal carcinoma, high-grade, triple positive.   2. treated in the adjuvant setting with 4 doses of docetaxel and cyclophosphamide completed 11/16/2011 given along with trastuzumab    3. Trastuzumab to be continued for total of one year (to mid-January 2014)  4. Receiving radiation therapy, to be completed May 31   5. Comorbidities include a history of remote thrombotic thrombocytopenic purpura and hepatitis C.    PLAN: She is recovering well from chemotherapy, with no significant peripheral neuropathy or other evidence of endorgan damage. She is tolerating the trastuzumab without side effects that she is aware of. So far she is also doing well with her radiation.  Today we discussed anti-estrogen therapy. Given her normal baseline bone density, I think anastrozole would be a good choice for her. I went ahead and wrote her the prescription, cautioning her regarding cost issues. She is going to see [name] again in early July. We should know by then how she is tolerating her aromatase inhibitor. She knows to call for any problems  that may develop before then  Jolynda Townley C    12/28/2011

## 2011-12-28 NOTE — Patient Instructions (Signed)
[facility] Discharge Instructions for Patients Receiving Chemotherapy  Today you received the following chemotherapy agents Herceptin To help prevent nausea and vomiting after your treatment, we encourage you to take your nausea medication as prescribed.  If you develop nausea and vomiting that is not controlled by your nausea medication, call the clinic. If it is after clinic hours your family physician or the after hours number for the clinic or go to the Emergency Department.   BELOW ARE SYMPTOMS THAT SHOULD BE REPORTED IMMEDIATELY:  *FEVER GREATER THAN 100.5 F  *CHILLS WITH OR WITHOUT FEVER  NAUSEA AND VOMITING THAT IS NOT CONTROLLED WITH YOUR NAUSEA MEDICATION  *UNUSUAL SHORTNESS OF BREATH  *UNUSUAL BRUISING OR BLEEDING  TENDERNESS IN MOUTH AND THROAT WITH OR WITHOUT PRESENCE OF ULCERS  *URINARY PROBLEMS  *BOWEL PROBLEMS  UNUSUAL RASH Items with * indicate a potential emergency and should be followed up as soon as possible.  One of the nurses will contact you 24 hours after your treatment. Please let the nurse know about any problems that you may have experienced. Feel free to call the clinic you have any questions or concerns. The clinic phone number is [phone].   I have been informed and understand all the instructions given to me. I know to contact the clinic, my physician, or go to the Emergency Department if any problems should occur. I do not have any questions at this time, but understand that I may call the clinic during office hours   should I have any questions or need assistance in obtaining follow up care.    __________________________________________  _____________  __________ Signature of Patient or Authorized Representative            Date                   Time    __________________________________________ Nurse's Signature    

## 2011-12-28 NOTE — Progress Notes (Signed)
Weekly Management Note:  Site:L Breast Current Dose:  1800  cGy Projected Dose: 6080  cGy  Narrative: The patient is seen today for routine under treatment assessment. CBCT/MVCT images/port films were reviewed. The chart was reviewed.   No complaints today. She uses Radioplex gel when necessary.  Physical Examination:  Filed Vitals:   12/28/11 1140  BP: 123/83  Pulse: 83  .  Weight: 260 lb 1.6 oz (117.981 kg). Faint to mild erythema the skin with no areas of desquamation.  Impression: Tolerating radiation therapy well.  Plan: Continue radiation therapy as planned.

## 2011-12-29 ENCOUNTER — Telehealth: Payer: Self-pay | Admitting: Oncology

## 2011-12-29 ENCOUNTER — Ambulatory Visit
Admission: RE | Admit: 2011-12-29 | Discharge: 2011-12-29 | Disposition: A | Discharge status: Home / Self Care | Payer: Managed Care, Other (non HMO) | Source: Ambulatory Visit | Attending: Radiation Oncology | Admitting: Radiation Oncology

## 2011-12-29 NOTE — Telephone Encounter (Signed)
S/w the pt and she is aware of her July appts. S/w [place] [facility] to schedule the pt an appt with dr lebscher. Per the office this pt is already established with dr Yetta Barre. Explained this to the pt who stated she wanted a female md. gve the pt the office number for her to call them directly for them to make the switch

## 2011-12-30 ENCOUNTER — Ambulatory Visit
Admission: RE | Admit: 2011-12-30 | Discharge: 2011-12-30 | Disposition: A | Discharge status: Home / Self Care | Payer: Managed Care, Other (non HMO) | Source: Ambulatory Visit | Attending: Radiation Oncology | Admitting: Radiation Oncology

## 2011-12-31 ENCOUNTER — Ambulatory Visit
Admission: RE | Admit: 2011-12-31 | Discharge: 2011-12-31 | Disposition: A | Discharge status: Home / Self Care | Payer: Managed Care, Other (non HMO) | Source: Ambulatory Visit | Attending: Radiation Oncology | Admitting: Radiation Oncology

## 2012-01-01 ENCOUNTER — Ambulatory Visit
Admission: RE | Admit: 2012-01-01 | Discharge: 2012-01-01 | Disposition: A | Discharge status: Home / Self Care | Payer: Managed Care, Other (non HMO) | Source: Ambulatory Visit | Attending: Radiation Oncology | Admitting: Radiation Oncology

## 2012-01-04 ENCOUNTER — Ambulatory Visit
Admission: RE | Admit: 2012-01-04 | Discharge: 2012-01-04 | Disposition: A | Discharge status: Home / Self Care | Payer: Managed Care, Other (non HMO) | Source: Ambulatory Visit | Attending: Radiation Oncology | Admitting: Radiation Oncology

## 2012-01-04 ENCOUNTER — Ambulatory Visit: Payer: Managed Care, Other (non HMO) | Attending: Radiation Oncology | Admitting: Physical Therapy

## 2012-01-04 VITALS — BP 126/75 | HR 84 | Temp 97.5°F | Wt 258.6 lb

## 2012-01-04 DIAGNOSIS — IMO0001 Reserved for inherently not codable concepts without codable children: Secondary | ICD-10-CM | POA: Insufficient documentation

## 2012-01-04 DIAGNOSIS — C50919 Malignant neoplasm of unspecified site of unspecified female breast: Secondary | ICD-10-CM

## 2012-01-04 DIAGNOSIS — I890 Lymphedema, not elsewhere classified: Secondary | ICD-10-CM | POA: Insufficient documentation

## 2012-01-04 MED ORDER — BIAFINE EX EMUL
CUTANEOUS | Status: DC | PRN
  Administered 2012-01-04: 1 via TOPICAL

## 2012-01-04 NOTE — Progress Notes (Signed)
Weekly Management Note:  Site:L Breast Current Dose:  2700  cGy Projected Dose: 6080  cGy  Narrative: The patient is seen today for routine under treatment assessment. CBCT/MVCT images/port films were reviewed. The chart was reviewed.   She has developed a pruritic rash along the upper inner aspect of her left breast. She has been using Radioplex gel.  Physical Examination:  Filed Vitals:   01/04/12 1215  BP: 126/75  Pulse: 84  Temp: 97.5 F (36.4 C)  .  Weight: 258 lb 9.6 oz (117.3 kg). There is mild to moderate erythema the skin along the left breast with a papular erythematous rash along the upper inner aspect of the breast.  Impression: Tolerating radiation therapy well. Although she does have focal radiation dermatitis along the upper inner aspect of the left breast.  Plan: Continue radiation therapy as planned. I want her to use hydrocortisone cream when necessary along the rash and changed to Biafine cream elsewhere.

## 2012-01-04 NOTE — Progress Notes (Signed)
Here for routine weekly md visit for radiation treatment of left breast.Has a follicular reaction mid upper chest but otherwise skin looks good.Using hydrocortisone for the itching.Denies pain or fatigue.Has completed 15 of 26 regular breast treatments.

## 2012-01-05 ENCOUNTER — Ambulatory Visit
Admission: RE | Admit: 2012-01-05 | Discharge: 2012-01-05 | Disposition: A | Discharge status: Home / Self Care | Payer: Managed Care, Other (non HMO) | Source: Ambulatory Visit | Attending: Radiation Oncology | Admitting: Radiation Oncology

## 2012-01-06 ENCOUNTER — Ambulatory Visit: Payer: Managed Care, Other (non HMO)

## 2012-01-06 ENCOUNTER — Ambulatory Visit
Admission: RE | Admit: 2012-01-06 | Discharge: 2012-01-06 | Disposition: A | Discharge status: Home / Self Care | Payer: Managed Care, Other (non HMO) | Source: Ambulatory Visit | Attending: Radiation Oncology | Admitting: Radiation Oncology

## 2012-01-07 ENCOUNTER — Ambulatory Visit
Admission: RE | Admit: 2012-01-07 | Discharge: 2012-01-07 | Disposition: A | Discharge status: Home / Self Care | Payer: Managed Care, Other (non HMO) | Source: Ambulatory Visit | Attending: Radiation Oncology | Admitting: Radiation Oncology

## 2012-01-08 ENCOUNTER — Ambulatory Visit
Admission: RE | Admit: 2012-01-08 | Discharge: 2012-01-08 | Disposition: A | Discharge status: Home / Self Care | Payer: Managed Care, Other (non HMO) | Source: Ambulatory Visit | Attending: Radiation Oncology | Admitting: Radiation Oncology

## 2012-01-11 ENCOUNTER — Encounter: Payer: Self-pay | Admitting: Radiation Oncology

## 2012-01-11 ENCOUNTER — Ambulatory Visit
Admission: RE | Admit: 2012-01-11 | Discharge: 2012-01-11 | Disposition: A | Discharge status: Home / Self Care | Payer: Managed Care, Other (non HMO) | Source: Ambulatory Visit | Attending: Radiation Oncology | Admitting: Radiation Oncology

## 2012-01-11 VITALS — BP 116/77 | HR 105 | Temp 96.9°F | Resp 20 | Wt 246.0 lb

## 2012-01-11 DIAGNOSIS — C50919 Malignant neoplasm of unspecified site of unspecified female breast: Secondary | ICD-10-CM

## 2012-01-11 NOTE — Progress Notes (Signed)
Pt reports she began having nausea last Thurs, n/v and diarrhea since Fri. She has taken Imodium and Zofran for nausea sporadically. No diarrhea  on Sunday. Has been having stomach cramps and gas pains as well that begin on left low abd and "go across her abd to right lower side". Today pt had diarrhea and then vomited after lunch, took Zofran and med for diarrhea afterwards. Pt drinking water to hydrate.  Pt has no new meds, states she adopted dog from pound last week but dog "is healthy".   Applying Biafine to upper left breast, Radiaplex to other tx area on left breast.

## 2012-01-11 NOTE — Progress Notes (Signed)
Weekly Management Note:  Site:L  Breast Current Dose:  3600  cGy Projected Dose: 6080  cGy  Narrative: The patient is seen today for routine under treatment assessment. CBCT/MVCT images/port films were reviewed. The chart was reviewed.   She's had nausea, vomiting, and diarrhea since late last week. She has taken Imodium when necessary. She denies having a fever. She has not seen her primary care physician. She has been using Biafine cream and also hydrocortisone cream along the papular eruption along the upper inner aspect of the left breast.  Physical Examination:  Filed Vitals:   01/11/12 1353  BP: 116/77  Pulse: 105  Temp:   Resp:   .  Weight: 246 lb (111.585 kg). There is moderate erythema the skin with a papular eruption along the upper inner aspect of the breast. No areas of desquamation.  Impression: Tolerating radiation therapy well. Probable viral gastroenteritis. I instructed her to see her primary care physician. The meantime, she may take Imodium when necessary. She is to stay away from the products.  Plan: Continue radiation therapy as planned. See her primary care physician for her suspected viral gastroenteritis.

## 2012-01-12 ENCOUNTER — Ambulatory Visit
Admission: RE | Admit: 2012-01-12 | Discharge: 2012-01-12 | Disposition: A | Discharge status: Home / Self Care | Payer: Managed Care, Other (non HMO) | Source: Ambulatory Visit | Attending: Radiation Oncology | Admitting: Radiation Oncology

## 2012-01-12 ENCOUNTER — Other Ambulatory Visit (INDEPENDENT_AMBULATORY_CARE_PROVIDER_SITE_OTHER): Payer: Managed Care, Other (non HMO)

## 2012-01-12 ENCOUNTER — Encounter: Payer: Self-pay | Admitting: Internal Medicine

## 2012-01-12 ENCOUNTER — Ambulatory Visit (INDEPENDENT_AMBULATORY_CARE_PROVIDER_SITE_OTHER): Payer: Managed Care, Other (non HMO) | Admitting: Internal Medicine

## 2012-01-12 VITALS — BP 120/78 | HR 75 | Temp 97.2°F | Ht 68.0 in | Wt 245.4 lb

## 2012-01-12 DIAGNOSIS — R197 Diarrhea, unspecified: Secondary | ICD-10-CM

## 2012-01-12 DIAGNOSIS — R112 Nausea with vomiting, unspecified: Secondary | ICD-10-CM | POA: Insufficient documentation

## 2012-01-12 LAB — HEPATIC FUNCTION PANEL
ALT: 62 U/L — ABNORMAL HIGH (ref 0–35)
AST: 67 U/L — ABNORMAL HIGH (ref 0–37)
Albumin: 4.1 g/dL (ref 3.5–5.2)
Alkaline Phosphatase: 61 U/L (ref 39–117)
Bilirubin, Direct: 0.3 mg/dL (ref 0.0–0.3)
Total Bilirubin: 1.4 mg/dL — ABNORMAL HIGH (ref 0.3–1.2)
Total Protein: 7.7 g/dL (ref 6.0–8.3)

## 2012-01-12 LAB — BASIC METABOLIC PANEL
BUN: 11 mg/dL (ref 6–23)
CO2: 24 mEq/L (ref 19–32)
Calcium: 9 mg/dL (ref 8.4–10.5)
Chloride: 99 mEq/L (ref 96–112)
Creatinine, Ser: 0.8 mg/dL (ref 0.4–1.2)
GFR: 75.22 mL/min (ref >60.00)
Glucose, Bld: 81 mg/dL (ref 70–99)
Potassium: 3.9 mEq/L (ref 3.5–5.1)
Sodium: 135 mEq/L (ref 135–145)

## 2012-01-12 LAB — CBC WITH DIFFERENTIAL/PLATELET
Basophils Absolute: 0 10*3/uL (ref 0.0–0.1)
Basophils Relative: 0.6 % (ref 0.0–3.0)
Eosinophils Absolute: 0 10*3/uL (ref 0.0–0.7)
Eosinophils Relative: 0.5 % (ref 0.0–5.0)
HCT: 41.3 % (ref 36.0–46.0)
Hemoglobin: 13.7 g/dL (ref 12.0–15.0)
Lymphocytes Relative: 6.7 % — ABNORMAL LOW (ref 12.0–46.0)
Lymphs Abs: 0.5 10*3/uL — ABNORMAL LOW (ref 0.7–4.0)
MCHC: 33.2 g/dL (ref 30.0–36.0)
MCV: 96.4 fl (ref 78.0–100.0)
Monocytes Absolute: 0.7 10*3/uL (ref 0.1–1.0)
Monocytes Relative: 9.4 % (ref 3.0–12.0)
Neutro Abs: 5.9 10*3/uL (ref 1.4–7.7)
Neutrophils Relative %: 82.8 % — ABNORMAL HIGH (ref 43.0–77.0)
Platelets: 176 10*3/uL (ref 150.0–400.0)
RBC: 4.28 Mil/uL (ref 3.87–5.11)
RDW: 15.3 % — ABNORMAL HIGH (ref 11.5–14.6)
WBC: 7.1 10*3/uL (ref 4.5–10.5)

## 2012-01-12 MED ORDER — PROCHLORPERAZINE MALEATE 10 MG PO TABS: 10.0000 mg | ORAL_TABLET | Freq: Four times a day (QID) | ORAL | Status: DC | PRN

## 2012-01-12 MED ORDER — DIPHENOXYLATE-ATROPINE 2.5-0.025 MG PO TABS: 1.0000 | ORAL_TABLET | Freq: Four times a day (QID) | ORAL | Status: AC | PRN

## 2012-01-12 NOTE — Assessment & Plan Note (Addendum)
5-6 days onset, watery, assoc with n/v and diffuse abd bloating and LLQ tender tender - suspect Acute gastroenteritiis likely viral;  Not dehydrated by orthostatics, for lomotil/compazine prn, cont to push fluids, should be expected to improve at 7-10 days;  For bmet/cbc today

## 2012-01-12 NOTE — Patient Instructions (Addendum)
You do not appear to be dehydrated today Take all new medications as prescribed  - the lomotil for diarrhea Continue all other medications as before - including the refill of the compazine today Please go to LAB in the Basement for the blood and/or urine tests to be done today You will be contacted by phone if any changes need to be made immediately.  Otherwise, you will receive a letter about your results with an explanation.

## 2012-01-13 ENCOUNTER — Ambulatory Visit: Payer: Managed Care, Other (non HMO) | Admitting: Physical Therapy

## 2012-01-13 ENCOUNTER — Ambulatory Visit
Admission: RE | Admit: 2012-01-13 | Discharge: 2012-01-13 | Disposition: A | Discharge status: Home / Self Care | Payer: Managed Care, Other (non HMO) | Source: Ambulatory Visit | Attending: Radiation Oncology | Admitting: Radiation Oncology

## 2012-01-14 ENCOUNTER — Ambulatory Visit
Admission: RE | Admit: 2012-01-14 | Discharge: 2012-01-14 | Disposition: A | Discharge status: Home / Self Care | Payer: Managed Care, Other (non HMO) | Source: Ambulatory Visit | Attending: Radiation Oncology | Admitting: Radiation Oncology

## 2012-01-15 ENCOUNTER — Ambulatory Visit: Payer: Managed Care, Other (non HMO) | Admitting: Physical Therapy

## 2012-01-15 ENCOUNTER — Ambulatory Visit
Admission: RE | Admit: 2012-01-15 | Discharge: 2012-01-15 | Disposition: A | Discharge status: Home / Self Care | Payer: Managed Care, Other (non HMO) | Source: Ambulatory Visit | Attending: Radiation Oncology | Admitting: Radiation Oncology

## 2012-01-16 ENCOUNTER — Encounter: Payer: Self-pay | Admitting: Internal Medicine

## 2012-01-16 NOTE — Assessment & Plan Note (Signed)
Unclear etiology, suspect viral AGE, for phenergan prn, cont push fluids, tylenol prn

## 2012-01-16 NOTE — Progress Notes (Signed)
Subjective:    Patient ID: Tara [name], female    DOB: 1955/11/08, 56 y.o.   MRN: [id number]  HPI Here with acute complaint today;  Has finished chemotherapy now getting radiation only for left breast ca s/p lumpectomy;  C/o acute onset nausea x 4-5 days and watery diarrheal stools for 8 days, with 13 lbs wt loss; has few episode vomiting, none since lasat pm.  Stools 4-5 times per day.  Immodium and phenergan did help.  No fever, Feels gas type pain she states, not dizzy/not orthostatic, taking fluids ok over recent days.  Has been rx arimidex but not yet started.  Pt denies chest pain, increased sob or doe, wheezing, orthopnea, PND, increased LE swelling, palpitations, dizziness or syncope.  Pt denies new neurological symptoms such as new headache, or facial or extremity weakness or numbness   Pt denies polydipsia, polyuria. Past Medical History  Diagnosis Date  . Thrombocytopenia, primary   . Hepatitis C   . Asthma   . Arthritis   . Complication of anesthesia     difficulty waking up/dizzy/lightheaded  . Environmental allergies   . Shortness of breath   . Hypothyroidism   . TTP (thrombotic thrombocytopenic purpura)   . Blood transfusion   . Anxiety   . Dysrhythmia     irregular heartbeat, takes digoxin  . Chronic edema   . Breast cancer 2012    left breast   Past Surgical History  Procedure Date  . Elbow pins     left  . Right hip replacement   . Left foot reconstruction   . Ortho left foot surg    . Joint replacement     bilat hip  . Portacath placement 08/03/2011    Procedure: INSERTION PORT-A-CATH;  Surgeon: Robyne Askew, MD;  Location: [facility] OR;  Service: General;  Laterality: Right;  . Breast lumpectomy 08/03/11    left lumpectomy and slnbx,T1cN0,triple pos    reports that she has never smoked. She has never used smokeless tobacco. She reports that she drinks about .6 ounces of alcohol per week. She reports that she does not use illicit drugs. family history includes  Alcohol abuse in her maternal grandfather and Cancer in her maternal grandfather.  There is no history of Heart disease, and Hyperlipidemia, and Hypertension, . Allergies  Allergen Reactions  . Aspirin     Hx of TTP  . Nsaids     Hx TTP   Current Outpatient Prescriptions on File Prior to Visit  Medication Sig Dispense Refill  . acetaminophen (TYLENOL) 500 MG tablet Take 500 mg by mouth every 6 (six) hours as needed. For pain       . albuterol (PROVENTIL HFA;VENTOLIN HFA) 108 (90 BASE) MCG/ACT inhaler Inhale 2 puffs into the lungs every 6 (six) hours as needed. For shortness of breath       . cholecalciferol (VITAMIN D) 1000 UNITS tablet Take 2,000 Units by mouth daily.        . citalopram (CELEXA) 20 MG tablet TAKE 1 TABLET BY MOUTH EVERY DAILY  90 tablet  1  . clobetasol ointment (TEMOVATE) 0.05 % Apply 1 application topically daily as needed. For psoraisis       . digoxin (LANOXIN) 0.25 MG tablet Take 250 mcg by mouth daily.       . fluticasone (FLONASE) 50 MCG/ACT nasal spray Place 2 sprays into the nose daily as needed. For congestion      . glucosamine-chondroitin 500-400 MG tablet Take 1  tablet by mouth daily.       . Halcinonide (HALOG) 0.1 % CREA Apply 1 application topically daily as needed. For eczema      . levothyroxine (SYNTHROID, LEVOTHROID) 50 MCG tablet TAKE 1 TABLET BY MOUTH EVERY DAY  30 tablet  1  . lidocaine-prilocaine (EMLA) cream Apply topically as needed.        . loperamide (IMODIUM A-D) 2 MG tablet Take 2 mg by mouth 4 (four) times daily as needed. For diarrhea       . metroNIDAZOLE (METROCREAM) 0.75 % cream Apply topically 2 (two) times daily.  45 g  2  . montelukast (SINGULAIR) 10 MG tablet Take 10 mg by mouth at bedtime.        . non-metallic deodorant Thornton Papas) MISC Apply 1 application topically daily as needed.      . ondansetron (ZOFRAN) 8 MG tablet Take 1 tablet (8 mg total) by mouth every 12 (twelve) hours as needed for nausea.  30 tablet  2  . OVER THE COUNTER  MEDICATION Take 1 tablet by mouth daily as needed. For gas pain      . pimecrolimus (ELIDEL) 1 % cream Apply 1 application topically 2 (two) times daily as needed. For exczema       . prochlorperazine (COMPAZINE) 10 MG tablet Take 1 tablet (10 mg total) by mouth every 6 (six) hours as needed.  30 tablet  2  . PULMICORT FLEXHALER 180 MCG/ACT inhaler INHALE 2 PUFFS BY MOUTH EVERY MORNING  3 Inhaler  0  . tobramycin-dexamethasone (TOBRADEX) ophthalmic solution Place 1 drop into both eyes 2 (two) times daily as needed.        . vitamin B-12 (CYANOCOBALAMIN) 100 MCG tablet Take 2,000 mcg by mouth daily.       . Wound Cleansers (RADIAPLEX EX) Apply topically.      Marland Kitchen anastrozole (ARIMIDEX) 1 MG tablet Take 1 tablet (1 mg total) by mouth daily.  90 tablet  12    Review of Systems Review of Systems  Constitutional: Negative for diaphoresis and unexpected weight change.    Eyes: Negative for photophobia and visual disturbance.  Respiratory: Negative for choking and stridor.   Gastrointestinal: Negative for  blood in stool.  Genitourinary: Negative for hematuria and decreased urine volume.  Musculoskeletal: Negative for gait problem.  Skin: Negative for color change and wound.  Neurological: Negative for tremors and numbness.  Psychiatric/Behavioral: Negative for decreased concentration. The patient is not hyperactive.       Objective:   Physical Exam BP 120/78  Pulse 75  Temp(Src) 97.2 F (36.2 C) (Oral)  Ht 5\' 8"  (1.727 m)  Wt 245 lb 6 oz (111.301 kg)  BMI 37.31 kg/m2  SpO2 97% Physical Exam  VS noted, mild ill appearing Constitutional: Pt appears well-developed and well-nourished.  HENT: Head: Normocephalic.  Right Ear: External ear normal.  Left Ear: External ear normal.  Eyes: Conjunctivae and EOM are normal. Pupils are equal, round, and reactive to light.  Neck: Normal range of motion. Neck supple.  Cardiovascular: Normal rate and regular rhythm.   Pulmonary/Chest: Effort normal  and breath sounds normal.  Abd:  Soft, NT, non-distended, + BS except for mild LLQ tender without guarding or tender Neurological: Pt is alert. Not confused, motor/gait intact Skin: Skin is warm. No erythema. No rash or joint swelling Psychiatric: Pt behavior is normal. Thought content normal. 1+ nervous    Assessment & Plan:

## 2012-01-18 ENCOUNTER — Encounter: Payer: Self-pay | Admitting: Radiation Oncology

## 2012-01-18 ENCOUNTER — Ambulatory Visit
Admission: RE | Admit: 2012-01-18 | Discharge: 2012-01-18 | Disposition: A | Discharge status: Home / Self Care | Payer: Managed Care, Other (non HMO) | Source: Ambulatory Visit | Attending: Radiation Oncology | Admitting: Radiation Oncology

## 2012-01-18 ENCOUNTER — Ambulatory Visit: Payer: Managed Care, Other (non HMO) | Admitting: Physical Therapy

## 2012-01-18 VITALS — BP 110/70 | HR 78 | Temp 98.1°F | Resp 20 | Wt 239.8 lb

## 2012-01-18 DIAGNOSIS — C50919 Malignant neoplasm of unspecified site of unspecified female breast: Secondary | ICD-10-CM

## 2012-01-18 NOTE — Progress Notes (Signed)
Weekly Management Note:  Site:L Breast Current Dose:  4500  cGy Projected Dose: 6080  cGy  Narrative: The patient is seen today for routine under treatment assessment. CBCT/MVCT images/port films were reviewed. The chart was reviewed.   She now has laryngitis. Her viral gastroenteritis is improved.  Physical Examination:  Filed Vitals:   01/18/12 1323  BP: 110/70  Pulse: 78  Temp: 98.1 F (36.7 C)  Resp: 20  .  Weight: 239 lb 12.8 oz (108.773 kg). There is erythematous skin with patchy dry desquamation but no areas of moist desquamation.  Impression: Tolerating radiation therapy well.  Plan: Continue radiation therapy as planned.

## 2012-01-18 NOTE — Progress Notes (Signed)
Pt states nausea resolving, still has diarrhea.  Taking Imodium prn. She saw her PCP who felt this is viral. Pt has laryngitis today, stated due to sinus drainage. No fever, denies sore throat.  Applying Biafine to left breast.

## 2012-01-19 ENCOUNTER — Telehealth: Payer: Self-pay | Admitting: *Deleted

## 2012-01-19 ENCOUNTER — Other Ambulatory Visit: Payer: Self-pay | Admitting: *Deleted

## 2012-01-19 ENCOUNTER — Ambulatory Visit
Admission: RE | Admit: 2012-01-19 | Discharge: 2012-01-19 | Disposition: A | Discharge status: Home / Self Care | Payer: Managed Care, Other (non HMO) | Source: Ambulatory Visit | Attending: Radiation Oncology | Admitting: Radiation Oncology

## 2012-01-19 ENCOUNTER — Other Ambulatory Visit: Payer: Self-pay | Admitting: Physician Assistant

## 2012-01-19 VITALS — BP 121/78 | HR 90 | Temp 99.3°F

## 2012-01-19 DIAGNOSIS — C50919 Malignant neoplasm of unspecified site of unspecified female breast: Secondary | ICD-10-CM

## 2012-01-19 MED ORDER — FLUTICASONE PROPIONATE 50 MCG/ACT NA SUSP: 2.0000 | Freq: Every day | NASAL | Status: DC

## 2012-01-19 MED ORDER — FLUTICASONE PROPIONATE 50 MCG/ACT NA SUSP: 2.0000 | Freq: Every day | NASAL | Status: DC | PRN

## 2012-01-19 NOTE — Telephone Encounter (Signed)
Called pt. And let her know to come this Friday at 1:45 for her lab appt and then herceptin in the Infusion Room at 2:15.   Pt. Would like to change PCP to a female.  She is currently seeing Dr. Oliver Barre and that practice will not allow her to change to the female PCP if she is already seeing one of the other PCP.  So, Dr. Darnelle Catalan would recommend Dr. Eden Emms Baxley and Dr. Maurice Small for female PCP.  Gave patient their respective phone numbers for her to call for an appt.

## 2012-01-19 NOTE — Progress Notes (Signed)
Weekly Management Note Current Dose: 46.8  Gy  Projected Dose:  60.8 Gy   Narrative:  The patient presents for routine under treatment assessment.  CBCT/MVCT images/Port film x-rays were reviewed.  The chart was checked. Patient requested to be seen for cough, and hoarseness. Consistent with her allergy symptoms for which she has taken Claritin and flonase in the past with good relief.  Due to moving here and immediately starting breast cancer treatment, she has not established with a PCP.  She asked for recommendations of a female physician.   Physical Findings: Hoarse. Afebrile (99.3)  Impression:  The patient is tolerating radiation.  Plan:  Continue treatment as planned. Script written for flonase. Ok to use otc cough suppressants as long as she can take with her hep c. Will see RM tomorrow. Will print out a few recommendations for female physicians.

## 2012-01-19 NOTE — Telephone Encounter (Signed)
sent michelle email to set up patient's treatment on 02-12-2012 and 03-04-2012 printed out calendar and gave to the patient

## 2012-01-19 NOTE — Telephone Encounter (Signed)
Per desk RN, I have scheduled treatment appt for the patient. Desk RN aware of appt.  JMW

## 2012-01-19 NOTE — Telephone Encounter (Signed)
Per e-mail from Val[place], I have scheduled the appts.  JMW

## 2012-01-19 NOTE — Progress Notes (Signed)
Patient here with concern of cough for last 48 hours, and loss of voice in last 24 hours.Think some of symptoms related to allergies.Informed to start taking currently prescribed Flonase and Claritin for allergies but would defer to md for suggestion for cough medicine as patient has h/o asthma.Patient does have low grade temperature of 99.3.

## 2012-01-20 ENCOUNTER — Ambulatory Visit
Admission: RE | Admit: 2012-01-20 | Discharge: 2012-01-20 | Disposition: A | Discharge status: Home / Self Care | Payer: Managed Care, Other (non HMO) | Source: Ambulatory Visit | Attending: Radiation Oncology | Admitting: Radiation Oncology

## 2012-01-20 ENCOUNTER — Encounter: Payer: Self-pay | Admitting: Radiation Oncology

## 2012-01-20 ENCOUNTER — Ambulatory Visit: Payer: Managed Care, Other (non HMO)

## 2012-01-20 DIAGNOSIS — C50919 Malignant neoplasm of unspecified site of unspecified female breast: Secondary | ICD-10-CM

## 2012-01-20 NOTE — Progress Notes (Signed)
Weekly Management Note:  Site:L Breast  Current Dose:  4880  cGy Projected Dose: 6080  cGy  Narrative: The patient is seen today for routine under treatment assessment. CBCT/MVCT images/port films were reviewed. The chart was reviewed.   She is without complaints today. She uses Radioplex gel when necessary. She begins her photon boost today.  Physical Examination: There were no vitals filed for this visit..  Weight:  . There is moderate erythema the skin along the left breast with patchy dry desquamation.  Impression: Tolerating radiation therapy well.  Plan: Continue radiation therapy as planned.

## 2012-01-20 NOTE — Progress Notes (Signed)
Simulation note:  The patient was taken to the CT simulator for scanning of her left breast for her photon tumor bed boost. A custom neck mold was constructed on a breast board for immobilization. Her chest/breast was scanned. I chose and isocenter along her tumor bed. She is setup to a 3 field technique with 3 separate and unique multileaf collimators to conform the field. I prescribed 1400 cGy in 7 sessions utilizing 18 MV photons AP, 10 MV photons LAO and 6 MV photons LPO. An isodose plan and dosimetry were requested.

## 2012-01-21 ENCOUNTER — Ambulatory Visit: Payer: Managed Care, Other (non HMO) | Admitting: Physical Therapy

## 2012-01-21 ENCOUNTER — Encounter: Payer: Self-pay | Admitting: Radiation Oncology

## 2012-01-21 ENCOUNTER — Ambulatory Visit
Admission: RE | Admit: 2012-01-21 | Discharge: 2012-01-21 | Disposition: A | Discharge status: Home / Self Care | Payer: Managed Care, Other (non HMO) | Source: Ambulatory Visit | Attending: Radiation Oncology | Admitting: Radiation Oncology

## 2012-01-21 NOTE — Progress Notes (Signed)
Simulation verification note:  The patient underwent similar to verification today, 01/21/2012 for her left breast boost. The portal today was associated with the wrong DRR , but the isocenter appears to be in good position along with previous review of her multileaf collimators.

## 2012-01-22 ENCOUNTER — Ambulatory Visit
Admission: RE | Admit: 2012-01-22 | Discharge: 2012-01-22 | Disposition: A | Discharge status: Home / Self Care | Payer: Managed Care, Other (non HMO) | Source: Ambulatory Visit | Attending: Radiation Oncology | Admitting: Radiation Oncology

## 2012-01-22 ENCOUNTER — Ambulatory Visit (HOSPITAL_BASED_OUTPATIENT_CLINIC_OR_DEPARTMENT_OTHER): Payer: Managed Care, Other (non HMO)

## 2012-01-22 ENCOUNTER — Other Ambulatory Visit (HOSPITAL_BASED_OUTPATIENT_CLINIC_OR_DEPARTMENT_OTHER): Payer: Managed Care, Other (non HMO) | Admitting: Lab

## 2012-01-22 VITALS — BP 127/79 | HR 80 | Temp 97.5°F

## 2012-01-22 DIAGNOSIS — C50919 Malignant neoplasm of unspecified site of unspecified female breast: Secondary | ICD-10-CM

## 2012-01-22 DIAGNOSIS — C50419 Malignant neoplasm of upper-outer quadrant of unspecified female breast: Secondary | ICD-10-CM

## 2012-01-22 DIAGNOSIS — Z5112 Encounter for antineoplastic immunotherapy: Secondary | ICD-10-CM

## 2012-01-22 LAB — CBC WITH DIFFERENTIAL/PLATELET
BASO%: 0.3 % (ref 0.0–2.0)
Basophils Absolute: 0 10*3/uL (ref 0.0–0.1)
EOS%: 4.3 % (ref 0.0–7.0)
Eosinophils Absolute: 0.2 10*3/uL (ref 0.0–0.5)
HCT: 39.5 % (ref 34.8–46.6)
HGB: 13.4 g/dL (ref 11.6–15.9)
LYMPH%: 10.3 % — ABNORMAL LOW (ref 14.0–49.7)
MCH: 32.5 pg (ref 25.1–34.0)
MCHC: 33.8 g/dL (ref 31.5–36.0)
MCV: 96.2 fL (ref 79.5–101.0)
MONO#: 0.6 10*3/uL (ref 0.1–0.9)
MONO%: 12.9 % (ref 0.0–14.0)
NEUT#: 3.4 10*3/uL (ref 1.5–6.5)
NEUT%: 72.2 % (ref 38.4–76.8)
Platelets: 133 10*3/uL — ABNORMAL LOW (ref 145–400)
RBC: 4.11 10*6/uL (ref 3.70–5.45)
RDW: 14.8 % — ABNORMAL HIGH (ref 11.2–14.5)
WBC: 4.8 10*3/uL (ref 3.9–10.3)
lymph#: 0.5 10*3/uL — ABNORMAL LOW (ref 0.9–3.3)
nRBC: 0 % (ref 0–0)

## 2012-01-22 MED ORDER — DIPHENHYDRAMINE HCL 25 MG PO CAPS
25.0000 mg | ORAL_CAPSULE | Freq: Once | ORAL | Status: AC
  Administered 2012-01-22: 25 mg via ORAL

## 2012-01-22 MED ORDER — HEPARIN SOD (PORK) LOCK FLUSH 100 UNIT/ML IV SOLN
500.0000 [IU] | Freq: Once | INTRAVENOUS | Status: AC | PRN
  Administered 2012-01-22: 500 [IU]
  Filled 2012-01-22: qty 5

## 2012-01-22 MED ORDER — TRASTUZUMAB CHEMO INJECTION 440 MG
6.0000 mg/kg | Freq: Once | INTRAVENOUS | Status: AC
  Administered 2012-01-22: 693 mg via INTRAVENOUS
  Filled 2012-01-22: qty 33

## 2012-01-22 MED ORDER — SODIUM CHLORIDE 0.9 % IV SOLN
Freq: Once | INTRAVENOUS | Status: AC
  Administered 2012-01-22: 14:00:00 via INTRAVENOUS

## 2012-01-22 MED ORDER — ACETAMINOPHEN 325 MG PO TABS
650.0000 mg | ORAL_TABLET | Freq: Once | ORAL | Status: AC
  Administered 2012-01-22: 650 mg via ORAL

## 2012-01-22 MED ORDER — SODIUM CHLORIDE 0.9 % IJ SOLN
10.0000 mL | INTRAMUSCULAR | Status: DC | PRN
  Administered 2012-01-22: 10 mL
  Filled 2012-01-22: qty 10

## 2012-01-22 NOTE — Patient Instructions (Signed)
     SYMPTOMS TO REPORT AS SOON AS POSSIBLE AFTER TREATMENT:  FEVER GREATER THAN 100.5 F  CHILLS WITH OR WITHOUT FEVER  NAUSEA AND VOMITING THAT IS NOT CONTROLLED WITH YOUR NAUSEA MEDICATION  UNUSUAL SHORTNESS OF BREATH  UNUSUAL BRUISING OR BLEEDING  TENDERNESS IN MOUTH AND THROAT WITH OR WITHOUT PRESENCE OF ULCERS  URINARY PROBLEMS  BOWEL PROBLEMS  UNUSUAL RASH    Wear comfortable clothing and clothing appropriate for easy access to any Portacath or PICC line. Let us know if there is anything that we can do to make your therapy better!      I have been informed and understand all of the instructions given to me and have received a copy. I have been instructed to call the clinic ([phone])  or my family physician as soon as possible for continued medical care, if indicated. I do not have any more questions at this time but understand that I may call the Cancer Center at ([phone] during office hours should I have questions or need assistance in obtaining follow-up care.      _________________________________________      _______________     __________ Signature of Patient or Authorized Representative        Date                            Time      _________________________________________ Nurse's Signature

## 2012-01-26 ENCOUNTER — Encounter: Payer: Self-pay | Admitting: Radiation Oncology

## 2012-01-26 ENCOUNTER — Ambulatory Visit
Admission: RE | Admit: 2012-01-26 | Discharge: 2012-01-26 | Disposition: A | Discharge status: Home / Self Care | Payer: Managed Care, Other (non HMO) | Source: Ambulatory Visit | Attending: Radiation Oncology | Admitting: Radiation Oncology

## 2012-01-26 NOTE — Progress Notes (Addendum)
Pt states diarrhea, nausea from last week has resolved. Pt cont to have cough, laryngitis but it is improving. She states she retains fluid re: weight gain and is feeling better w/o nausea and diarrhea. Applying Radiaplex, Biafine to skin in tx area. States she has "irritation, 2 blisters" in her cleavage area. Advised if blisters open to apply Neosporin.  Pt to be seen by dr Friday, per Dr Roselind Messier. Pt verbalized understanding, agreement.

## 2012-01-27 ENCOUNTER — Ambulatory Visit
Admission: RE | Admit: 2012-01-27 | Discharge: 2012-01-27 | Disposition: A | Discharge status: Home / Self Care | Payer: Managed Care, Other (non HMO) | Source: Ambulatory Visit | Attending: Radiation Oncology | Admitting: Radiation Oncology

## 2012-01-27 ENCOUNTER — Ambulatory Visit: Payer: Managed Care, Other (non HMO)

## 2012-01-28 ENCOUNTER — Ambulatory Visit
Admission: RE | Admit: 2012-01-28 | Discharge: 2012-01-28 | Disposition: A | Discharge status: Home / Self Care | Payer: Managed Care, Other (non HMO) | Source: Ambulatory Visit | Attending: Radiation Oncology | Admitting: Radiation Oncology

## 2012-01-29 ENCOUNTER — Ambulatory Visit
Admission: RE | Admit: 2012-01-29 | Discharge: 2012-01-29 | Disposition: A | Discharge status: Home / Self Care | Payer: Managed Care, Other (non HMO) | Source: Ambulatory Visit | Attending: Radiation Oncology | Admitting: Radiation Oncology

## 2012-01-29 ENCOUNTER — Encounter: Payer: Self-pay | Admitting: Radiation Oncology

## 2012-01-29 VITALS — Wt 253.9 lb

## 2012-01-29 DIAGNOSIS — C50919 Malignant neoplasm of unspecified site of unspecified female breast: Secondary | ICD-10-CM

## 2012-01-29 NOTE — Progress Notes (Signed)
Pt continuing to recover from sinus/cold/laryngitis. Taking Claritin, Coricidin, nasal spray. She states symptoms are improving gradually, but she thinks she may have "broken a rib coughing", on her right side. She states she has done this in the past. Instructed her on splinting her side w/pillow when she coughs.  Applying Biafine to tx area where she has dry desquamation. Denies pain, loss of appetite. Has fatigue she attributes to her cold. Pt completed radiation tx today; gave her FU card, skin care instructions.

## 2012-01-29 NOTE — Progress Notes (Signed)
  Radiation Oncology         ([phone]) [phone] ________________________________  Name: Tara [name]             MRN: [id number]  Date: 01/29/2012  DOB: Feb 18, 1956  Weekly Radiation Therapy Management  Current Dose: 60.8 Gy     Planned Dose:  60.8 Gy  Narrative . . . . . . . . The patient presents for routine under treatment assessment after her final radiation treatment to the left breast.                                   The patient is without complaint.                                 Set-up films were reviewed.                                 The chart was checked. Physical Findings. . . Weight essentially stable.  The treated left breast shows erythema. There is some dry desquamation in the upper quadrant as well as follicular changes related to previous solar exposure. No evidence of superinfection and no moist desquamation.. Impression . . . . . . . The patient is tolerated radiation. Plan . . . . . . . . . . . . Complete treatment as planned. The patient will see Dr. Dayton Scrape for followup in one month  ________________________________  Artist Pais. Kathrynn Running, M.D.

## 2012-02-01 ENCOUNTER — Encounter: Payer: Self-pay | Admitting: Radiation Oncology

## 2012-02-01 ENCOUNTER — Ambulatory Visit: Payer: 59 | Attending: Radiation Oncology | Admitting: Physical Therapy

## 2012-02-01 ENCOUNTER — Telehealth: Payer: Self-pay | Admitting: Oncology

## 2012-02-01 ENCOUNTER — Telehealth: Payer: Self-pay | Admitting: *Deleted

## 2012-02-01 DIAGNOSIS — I890 Lymphedema, not elsewhere classified: Secondary | ICD-10-CM | POA: Insufficient documentation

## 2012-02-01 DIAGNOSIS — IMO0001 Reserved for inherently not codable concepts without codable children: Secondary | ICD-10-CM | POA: Insufficient documentation

## 2012-02-01 NOTE — Progress Notes (Signed)
[facility] Radiation Oncology End of Treatment Note  Name:Tara [name]  Date: 02/01/2012 ZOX:[id number] DOB:1955-11-05   Status:outpatient    CC: Sanda Linger, MD, MD  Dr. Chevis Pretty III  REFERRING PHYSICIAN: Dr. Chevis Pretty  III   DIAGNOSIS: Stage I (T1, N0, M0) invasive ductal/DCIS of the left breast   INDICATION FOR TREATMENT: Curative   TREATMENT DATES: 12/15/2011 through 01/29/2012                          SITE/DOSE:  Left breast 4680 cGy 26 sessions,  left breast tumor bed boost 1400 cGy 7 sessions                          BEAMS/ENERGY:  6 MV photons tangential fields to the left breast, treated prone for the first 4680 cGy and then treated supine with mixed 6 MV/10 MV/18 MV photons directed to her tumor bed for her boost.                 NARRATIVE:   The patient tolerated treatment well with the expected degree of erythema/hyperpigmentation and dry desquamation by completion of therapy. She had no areas of moist desquamation despite her large breast size. She used Radioplex gel during her course of therapy.                         PLAN: Routine followup in one month. Patient instructed to call if questions or worsening complaints in interim.

## 2012-02-01 NOTE — Telephone Encounter (Signed)
Pt called to schedule an appt with the pa or dr Darnelle Catalan due to she is not feeling to good. Transferred the caller over to the desk nurse to be further assessed

## 2012-02-01 NOTE — Telephone Encounter (Signed)
Pt called to this RN to state ongoing viral symptoms in May. Tara [name] states she has nausea and diarrhea in early May " for almost 2 weeks ". She then developed laryngitis and nasal drainage. Symptoms began to subside but returned over the weekend.  Tara [name] states she is not under care of primary MD presently as she is awaiting an appointment with a female internist.  Per review with MD and PA appt made for this Wednesday.

## 2012-02-03 ENCOUNTER — Telehealth: Payer: Self-pay | Admitting: Oncology

## 2012-02-03 ENCOUNTER — Ambulatory Visit: Payer: 59

## 2012-02-03 ENCOUNTER — Other Ambulatory Visit (HOSPITAL_BASED_OUTPATIENT_CLINIC_OR_DEPARTMENT_OTHER): Payer: Managed Care, Other (non HMO) | Admitting: Lab

## 2012-02-03 ENCOUNTER — Ambulatory Visit (HOSPITAL_COMMUNITY)
Admission: RE | Admit: 2012-02-03 | Discharge: 2012-02-03 | Disposition: A | Discharge status: Home / Self Care | Payer: 59 | Source: Ambulatory Visit | Attending: Physician Assistant | Admitting: Physician Assistant

## 2012-02-03 ENCOUNTER — Encounter: Payer: Self-pay | Admitting: Physician Assistant

## 2012-02-03 ENCOUNTER — Ambulatory Visit (HOSPITAL_BASED_OUTPATIENT_CLINIC_OR_DEPARTMENT_OTHER): Payer: Managed Care, Other (non HMO) | Admitting: Physician Assistant

## 2012-02-03 VITALS — BP 128/79 | HR 80 | Temp 98.4°F | Ht 68.0 in | Wt 254.2 lb

## 2012-02-03 DIAGNOSIS — C50919 Malignant neoplasm of unspecified site of unspecified female breast: Secondary | ICD-10-CM | POA: Insufficient documentation

## 2012-02-03 DIAGNOSIS — J069 Acute upper respiratory infection, unspecified: Secondary | ICD-10-CM

## 2012-02-03 DIAGNOSIS — Z170 Estrogen receptor positive status [ER+]: Secondary | ICD-10-CM

## 2012-02-03 DIAGNOSIS — C50419 Malignant neoplasm of upper-outer quadrant of unspecified female breast: Secondary | ICD-10-CM

## 2012-02-03 DIAGNOSIS — R05 Cough: Secondary | ICD-10-CM | POA: Insufficient documentation

## 2012-02-03 DIAGNOSIS — R059 Cough, unspecified: Secondary | ICD-10-CM | POA: Insufficient documentation

## 2012-02-03 LAB — CBC WITH DIFFERENTIAL/PLATELET
BASO%: 0.4 % (ref 0.0–2.0)
Basophils Absolute: 0 10*3/uL (ref 0.0–0.1)
EOS%: 6.4 % (ref 0.0–7.0)
Eosinophils Absolute: 0.3 10*3/uL (ref 0.0–0.5)
HCT: 36.7 % (ref 34.8–46.6)
HGB: 12.2 g/dL (ref 11.6–15.9)
LYMPH%: 8.7 % — ABNORMAL LOW (ref 14.0–49.7)
MCH: 32.2 pg (ref 25.1–34.0)
MCHC: 33.3 g/dL (ref 31.5–36.0)
MCV: 96.7 fL (ref 79.5–101.0)
MONO#: 0.3 10*3/uL (ref 0.1–0.9)
MONO%: 7.6 % (ref 0.0–14.0)
NEUT#: 3.4 10*3/uL (ref 1.5–6.5)
NEUT%: 76.9 % — ABNORMAL HIGH (ref 38.4–76.8)
Platelets: 125 10*3/uL — ABNORMAL LOW (ref 145–400)
RBC: 3.8 10*6/uL (ref 3.70–5.45)
RDW: 14.7 % — ABNORMAL HIGH (ref 11.2–14.5)
WBC: 4.4 10*3/uL (ref 3.9–10.3)
lymph#: 0.4 10*3/uL — ABNORMAL LOW (ref 0.9–3.3)
nRBC: 0 % (ref 0–0)

## 2012-02-03 LAB — COMPREHENSIVE METABOLIC PANEL
ALT: 76 U/L — ABNORMAL HIGH (ref 0–35)
AST: 64 U/L — ABNORMAL HIGH (ref 0–37)
Albumin: 3.6 g/dL (ref 3.5–5.2)
Alkaline Phosphatase: 69 U/L (ref 39–117)
BUN: 10 mg/dL (ref 6–23)
CO2: 28 mEq/L (ref 19–32)
Calcium: 8.1 mg/dL — ABNORMAL LOW (ref 8.4–10.5)
Chloride: 104 mEq/L (ref 96–112)
Creatinine, Ser: 0.71 mg/dL (ref 0.50–1.10)
Glucose, Bld: 154 mg/dL — ABNORMAL HIGH (ref 70–99)
Potassium: 3.9 mEq/L (ref 3.5–5.3)
Sodium: 142 mEq/L (ref 135–145)
Total Bilirubin: 0.6 mg/dL (ref 0.3–1.2)
Total Protein: 6 g/dL (ref 6.0–8.3)

## 2012-02-03 LAB — CANCER ANTIGEN 27.29: CA 27.29: 29 U/mL (ref 0–39)

## 2012-02-03 MED ORDER — CIPROFLOXACIN HCL 500 MG PO TABS: 500.0000 mg | ORAL_TABLET | Freq: Two times a day (BID) | ORAL | Status: AC

## 2012-02-03 MED ORDER — PROMETHAZINE-CODEINE 6.25-10 MG/5ML PO SYRP: 5.0000 mL | ORAL_SOLUTION | ORAL | Status: AC | PRN

## 2012-02-03 NOTE — Progress Notes (Signed)
ID: [name]   DOB: 07/31/1956  MR#: [id number]  WUX#:[id number]  HISTORY OF PRESENT ILLNESS: Perline noted a mass in her left breast and brought it to her physician's attention in [place], where she was residing. She was set up for mammography 06/24/2011 at [facility] in [place], [place]. This showed a suspicious abnormality in the left breast measuring approximately 10 mm. Physical examination showed a 1 cm firm superficial mass in the axillary region of the breast and ultrasound showed this to be 11 mm and spiculated.  Biopsy was performed June 25, 2011, and showed ([id number] at the [facility]) and invasive ductal carcinoma, grade 3, estrogen receptor 95% positive, progesterone receptor 40% positive, with an MIB-1 of 29%. The tumor is HER 2 positive by FISH with a ratio of 2.5. There is tumor heterogeneity noted.  With this information the case was presented Jul 08, 2011 at the multidisciplinary  breast cancer clinic. She proceeded to definitive left lumpectomy and sentinel lymph node sampling 08/03/2011. This confirmed a 1.6 cm invasive ductal carcinoma, grade 3. Margins were ample. The two sentinel lymph nodes were clear.  Her subsequent history is as detailed below.  INTERVAL HISTORY: Kaiden returns today in between scheduled followups. She contacted our office earlier this week with complaints of laryngitis and coughing. Vicki developed what was apparently a stomach virus approximately 4 weeks ago. As soon as that resolved (approximately 1 week later) she developed laryngitis. She now has a cough that has been increasingly problematic over the last 3 weeks. She had one episode of very mild hemoptysis, but that has not recurred. For the most part the cough is dry. Her voice is hoarse. She does have a history of asthma and has associated shortness of breath. She denies any fevers or chills. She's had no sore throats or problems swallowing.  REVIEW OF SYSTEMS: Zoi's  energy level is a little low. Fortunately she's had no additional nausea, emesis, or diarrhea. No abdominal cramping or pain. No abnormal headaches. She has some occasional pain in her left hip, but no increased arthralgias since beginning the anastrozole. No increased pedal edema. No orthopnea.  Otherwise a detailed review of systems is noncontributory.    PAST MEDICAL HISTORY: Past Medical History  Diagnosis Date  . Thrombocytopenia, primary   . Hepatitis C   . Asthma   . Arthritis   . Complication of anesthesia     difficulty waking up/dizzy/lightheaded  . Environmental allergies   . Shortness of breath   . Hypothyroidism   . TTP (thrombotic thrombocytopenic purpura)   . Blood transfusion   . Anxiety   . Dysrhythmia     irregular heartbeat, takes digoxin  . Chronic edema   . Breast cancer 2012    left breast   PAST SURGICAL HISTORY: Past Surgical History  Procedure Date  . Elbow pins     left  . Right hip replacement   . Left foot reconstruction   . Ortho left foot surg    . Joint replacement     bilat hip  . Portacath placement 08/03/2011    Procedure: INSERTION PORT-A-CATH;  Surgeon: Robyne Askew, MD;  [facility];  Service: General;  Laterality: Right;  . Breast lumpectomy 08/03/11    left lumpectomy and slnbx,T1cN0,triple pos    FAMILY HISTORY Family History  Problem Relation Age of Onset  . Cancer Maternal Grandfather     brain  . Alcohol abuse Maternal Grandfather   . Heart disease  Neg Hx   . Hyperlipidemia Neg Hx   . Hypertension Neg Hx   Patient's father died at the age of 87 from emphysema in the setting of tobacco abuse patient's mother is alive at 35 the patient has one brother and one sister there is no history of breast or ovarian cancer in the immediate family    Gynecologic history: She is GX P0 menarche age 30 most recent period July of 2008 she never took hormone replacement   Social History: the patient is a homemaker; she used to work  in an office previously. Her husband [name] is a Magazine features editor. They moved here from [facility] Oct 2012.    ADVANCED DIRECTIVES:  HEALTH MAINTENANCE: History  Substance Use Topics  . Smoking status: Never Smoker   . Smokeless tobacco: Never Used  . Alcohol Use: 0.6 oz/week    1 Glasses of wine per week     Colonoscopy:  PAP: Oct 2012  Bone density: Nov 2011/ nl  Lipid panel:  Allergies  Allergen Reactions  . Aspirin     Hx of TTP  . Nsaids     Hx TTP    Current Outpatient Prescriptions  Medication Sig Dispense Refill  . acetaminophen (TYLENOL) 500 MG tablet Take 500 mg by mouth every 6 (six) hours as needed. For pain       . albuterol (PROVENTIL HFA;VENTOLIN HFA) 108 (90 BASE) MCG/ACT inhaler Inhale 2 puffs into the lungs every 6 (six) hours as needed. For shortness of breath       . cholecalciferol (VITAMIN D) 1000 UNITS tablet Take 2,000 Units by mouth daily.        . ciprofloxacin (CIPRO) 500 MG tablet Take 1 tablet (500 mg total) by mouth 2 (two) times daily.  20 tablet  0  . citalopram (CELEXA) 20 MG tablet TAKE 1 TABLET BY MOUTH EVERY DAILY  90 tablet  1  . clobetasol ointment (TEMOVATE) 0.05 % Apply 1 application topically daily as needed. For psoraisis       . digoxin (LANOXIN) 0.25 MG tablet Take 250 mcg by mouth daily.       . fluticasone (FLONASE) 50 MCG/ACT nasal spray Place 2 sprays into the nose daily.  1 g  0  . fluticasone (FLONASE) 50 MCG/ACT nasal spray Place 2 sprays into the nose daily as needed. For congestion  16 g  0  . glucosamine-chondroitin 500-400 MG tablet Take 1 tablet by mouth daily.       . Halcinonide (HALOG) 0.1 % CREA Apply 1 application topically daily as needed. For eczema      . levothyroxine (SYNTHROID, LEVOTHROID) 50 MCG tablet TAKE 1 TABLET BY MOUTH EVERY DAY  30 tablet  1  . lidocaine-prilocaine (EMLA) cream Apply topically as needed.        . loperamide (IMODIUM A-D) 2 MG tablet Take 2 mg by mouth 4 (four) times daily as needed. For diarrhea        . Loratadine (CLARITIN) 10 MG CAPS Take by mouth as needed.      . metroNIDAZOLE (METROCREAM) 0.75 % cream Apply topically 2 (two) times daily.  45 g  2  . montelukast (SINGULAIR) 10 MG tablet Take 10 mg by mouth at bedtime.        . non-metallic deodorant Thornton Papas) MISC Apply 1 application topically daily as needed.      . ondansetron (ZOFRAN) 8 MG tablet Take 1 tablet (8 mg total) by mouth every 12 (twelve) hours as  needed for nausea.  30 tablet  2  . OVER THE COUNTER MEDICATION Take 1 tablet by mouth daily as needed. For gas pain      . pimecrolimus (ELIDEL) 1 % cream Apply 1 application topically 2 (two) times daily as needed. For exczema       . prochlorperazine (COMPAZINE) 10 MG tablet Take 1 tablet (10 mg total) by mouth every 6 (six) hours as needed.  30 tablet  2  . promethazine-codeine (PHENERGAN WITH CODEINE) 6.25-10 MG/5ML syrup Take 5 mLs by mouth every 4 (four) hours as needed for cough.  120 mL  0  . PULMICORT FLEXHALER 180 MCG/ACT inhaler INHALE 2 PUFFS BY MOUTH EVERY MORNING  3 Inhaler  0  . tobramycin-dexamethasone (TOBRADEX) ophthalmic solution Place 1 drop into both eyes 2 (two) times daily as needed.        . vitamin B-12 (CYANOCOBALAMIN) 100 MCG tablet Take 2,000 mcg by mouth daily.       . Wound Cleansers (RADIAPLEX EX) Apply topically.        OBJECTIVE: Middle-aged white woman in no acute distress Filed Vitals:   02/03/12 0922  BP: 128/79  Pulse: 80  Temp: 98.4 F (36.9 C)     Body mass index is 38.65 kg/(m^2).    ECOG FS: 1  Physical Exam: HEENT:  Sclerae anicteric, conjunctivae pink.  Oropharynx clear.  No pharyngeal erythema or exudate. Nodes:  No cervical, supraclavicular, or axillary lymphadenopathy palpated.  Breast Exam:  Deferred Lungs:  Clear to auscultation bilaterally.  No crackles, rhonchi, or wheezes.   Heart:  Regular rate and rhythm.   Abdomen:  Soft, obese, nontender.  Positive bowel sounds.  No organomegaly or masses palpated.     Musculoskeletal:  No focal spinal tenderness to palpation.  Extremities:  Benign.  No peripheral edema or cyanosis.   Neuro:  Nonfocal. Alert and oriented x3.     LAB RESULTS: Lab Results  Component Value Date   WBC 4.4 02/03/2012   NEUTROABS 3.4 02/03/2012   HGB 12.2 02/03/2012   HCT 36.7 02/03/2012   MCV 96.7 02/03/2012   PLT 125* 02/03/2012      Chemistry      Component Value Date/Time   NA 135 01/12/2012 1137   K 3.9 01/12/2012 1137   CL 99 01/12/2012 1137   CO2 24 01/12/2012 1137   BUN 11 01/12/2012 1137   CREATININE 0.8 01/12/2012 1137      Component Value Date/Time   CALCIUM 9.0 01/12/2012 1137   ALKPHOS 61 01/12/2012 1137   AST 67* 01/12/2012 1137   ALT 62* 01/12/2012 1137   BILITOT 1.4* 01/12/2012 1137       Lab Results  Component Value Date   LABCA2 29 07/08/2011     STUDIES: 2-D echocardiogram on 10/13/11 showed an ejection fraction of 60%. Next echo scheduled for 02/25/12.   ASSESSMENT: 56 year old [place] woman originally from [place]   1. Status post left lumpectomy and sentinel lymph node sampling 08/03/2011 for a T1C N0 (Stage I) invasive ductal carcinoma, high-grade, triple positive.   2. treated in the adjuvant setting with 4 doses of docetaxel and cyclophosphamide completed 11/16/2011 given along with trastuzumab    3. Trastuzumab to be continued for total of one year (to mid-January 2014)  4. Status post radiation therapy, completed late May of 2013.  5. began on anastrozole in early June 2013, 1 mg daily.   7. Comorbidities include a history of remote thrombotic thrombocytopenic purpura and hepatitis  C.   8. Upper respiratory infection with cough and   PLAN: With regards to her breast cancer, Ayianna appears to be doing quite well, and is tolerating the anastrozole well. It does appear, however, that she has developed an upper respiratory infection. I have ordered a chest x-ray today for further evaluation. I've given her prescription for Phenergan/codeine  cough syrup, and I am also starting her on Cipro, 500 mg by mouth twice a day for 10 days. I have asked her to contact us at that point if her symptoms have not improved significantly, hopefully even resolved.  Otherwise I will see Hooria in one month for routine followup on July 1. We will continue in the meanwhile with her trastuzumab on a every 3 week basis.   Verdie Barrows    02/03/2012

## 2012-02-03 NOTE — Telephone Encounter (Signed)
gve the pt her jkune 2013 appt calendar. Pt is aware that her chemo appt will be changed to mondays. Sent michelle a staff message. Pt will pick up the schedule the next time she comes in for her appts

## 2012-02-04 ENCOUNTER — Telehealth: Payer: Self-pay | Admitting: *Deleted

## 2012-02-04 NOTE — Telephone Encounter (Signed)
{

## 2012-02-08 ENCOUNTER — Ambulatory Visit: Payer: 59

## 2012-02-11 ENCOUNTER — Ambulatory Visit: Payer: 59 | Admitting: Physical Therapy

## 2012-02-12 ENCOUNTER — Other Ambulatory Visit: Payer: Self-pay | Admitting: Physician Assistant

## 2012-02-12 ENCOUNTER — Other Ambulatory Visit (HOSPITAL_BASED_OUTPATIENT_CLINIC_OR_DEPARTMENT_OTHER): Payer: Managed Care, Other (non HMO) | Admitting: Lab

## 2012-02-12 ENCOUNTER — Ambulatory Visit (HOSPITAL_BASED_OUTPATIENT_CLINIC_OR_DEPARTMENT_OTHER): Payer: Managed Care, Other (non HMO)

## 2012-02-12 VITALS — BP 148/82 | HR 92 | Temp 98.0°F

## 2012-02-12 DIAGNOSIS — Z5112 Encounter for antineoplastic immunotherapy: Secondary | ICD-10-CM

## 2012-02-12 DIAGNOSIS — C50419 Malignant neoplasm of upper-outer quadrant of unspecified female breast: Secondary | ICD-10-CM

## 2012-02-12 DIAGNOSIS — C50919 Malignant neoplasm of unspecified site of unspecified female breast: Secondary | ICD-10-CM

## 2012-02-12 DIAGNOSIS — E039 Hypothyroidism, unspecified: Secondary | ICD-10-CM

## 2012-02-12 LAB — CBC WITH DIFFERENTIAL/PLATELET
BASO%: 0.9 % (ref 0.0–2.0)
Basophils Absolute: 0 10*3/uL (ref 0.0–0.1)
EOS%: 5.6 % (ref 0.0–7.0)
Eosinophils Absolute: 0.3 10*3/uL (ref 0.0–0.5)
HCT: 39.1 % (ref 34.8–46.6)
HGB: 13 g/dL (ref 11.6–15.9)
LYMPH%: 10.2 % — ABNORMAL LOW (ref 14.0–49.7)
MCH: 31.8 pg (ref 25.1–34.0)
MCHC: 33.2 g/dL (ref 31.5–36.0)
MCV: 95.8 fL (ref 79.5–101.0)
MONO#: 0.6 10*3/uL (ref 0.1–0.9)
MONO%: 12.6 % (ref 0.0–14.0)
NEUT#: 3.6 10*3/uL (ref 1.5–6.5)
NEUT%: 70.7 % (ref 38.4–76.8)
Platelets: 160 10*3/uL (ref 145–400)
RBC: 4.09 10*6/uL (ref 3.70–5.45)
RDW: 14.3 % (ref 11.2–14.5)
WBC: 5 10*3/uL (ref 3.9–10.3)
lymph#: 0.5 10*3/uL — ABNORMAL LOW (ref 0.9–3.3)

## 2012-02-12 LAB — COMPREHENSIVE METABOLIC PANEL
ALT: 85 U/L — ABNORMAL HIGH (ref 0–35)
AST: 78 U/L — ABNORMAL HIGH (ref 0–37)
Albumin: 4 g/dL (ref 3.5–5.2)
Alkaline Phosphatase: 84 U/L (ref 39–117)
BUN: 12 mg/dL (ref 6–23)
CO2: 24 mEq/L (ref 19–32)
Calcium: 8.9 mg/dL (ref 8.4–10.5)
Chloride: 103 mEq/L (ref 96–112)
Creatinine, Ser: 0.74 mg/dL (ref 0.50–1.10)
Glucose, Bld: 96 mg/dL (ref 70–99)
Potassium: 4.3 mEq/L (ref 3.5–5.3)
Sodium: 139 mEq/L (ref 135–145)
Total Bilirubin: 0.7 mg/dL (ref 0.3–1.2)
Total Protein: 6.6 g/dL (ref 6.0–8.3)

## 2012-02-12 MED ORDER — SODIUM CHLORIDE 0.9 % IJ SOLN
10.0000 mL | INTRAMUSCULAR | Status: DC | PRN
  Administered 2012-02-12: 10 mL
  Filled 2012-02-12: qty 10

## 2012-02-12 MED ORDER — DIPHENHYDRAMINE HCL 25 MG PO CAPS
25.0000 mg | ORAL_CAPSULE | Freq: Once | ORAL | Status: AC
  Administered 2012-02-12: 25 mg via ORAL

## 2012-02-12 MED ORDER — SODIUM CHLORIDE 0.9 % IV SOLN
Freq: Once | INTRAVENOUS | Status: AC
  Administered 2012-02-12: 11:00:00 via INTRAVENOUS

## 2012-02-12 MED ORDER — ACETAMINOPHEN 325 MG PO TABS
650.0000 mg | ORAL_TABLET | Freq: Once | ORAL | Status: AC
  Administered 2012-02-12: 650 mg via ORAL

## 2012-02-12 MED ORDER — TRASTUZUMAB CHEMO INJECTION 440 MG
6.0000 mg/kg | Freq: Once | INTRAVENOUS | Status: AC
  Administered 2012-02-12: 693 mg via INTRAVENOUS
  Filled 2012-02-12: qty 33

## 2012-02-12 MED ORDER — HEPARIN SOD (PORK) LOCK FLUSH 100 UNIT/ML IV SOLN
500.0000 [IU] | Freq: Once | INTRAVENOUS | Status: AC | PRN
  Administered 2012-02-12: 500 [IU]
  Filled 2012-02-12: qty 5

## 2012-02-12 NOTE — Patient Instructions (Addendum)
[facility] Discharge Instructions for Patients Receiving Chemotherapy  Today you received the following chemotherapy agents Herceptin  To help prevent nausea and vomiting after your treatment, we encourage you to take your nausea medication Begin taking it at 7 pm and take it as often as prescribed for the next 24 to 72 hours.   If you develop nausea and vomiting that is not controlled by your nausea medication, call the clinic. If it is after clinic hours your family physician or the after hours number for the clinic or go to the Emergency Department.   BELOW ARE SYMPTOMS THAT SHOULD BE REPORTED IMMEDIATELY:  *FEVER GREATER THAN 100.5 F  *CHILLS WITH OR WITHOUT FEVER  NAUSEA AND VOMITING THAT IS NOT CONTROLLED WITH YOUR NAUSEA MEDICATION  *UNUSUAL SHORTNESS OF BREATH  *UNUSUAL BRUISING OR BLEEDING  TENDERNESS IN MOUTH AND THROAT WITH OR WITHOUT PRESENCE OF ULCERS  *URINARY PROBLEMS  *BOWEL PROBLEMS  UNUSUAL RASH Items with * indicate a potential emergency and should be followed up as soon as possible.  One of the nurses will contact you 24 hours after your treatment. Please let the nurse know about any problems that you may have experienced. Feel free to call the clinic you have any questions or concerns. The clinic phone number is [phone].   I have been informed and understand all the instructions given to me. I know to contact the clinic, my physician, or go to the Emergency Department if any problems should occur. I do not have any questions at this time, but understand that I may call the clinic during office hours   should I have any questions or need assistance in obtaining follow up care.    __________________________________________  _____________  __________ Signature of Patient or Authorized Representative            Date                   Time    __________________________________________ Nurse's Signature    

## 2012-02-12 NOTE — Telephone Encounter (Signed)
Pt needs to get further refills from primary care

## 2012-02-23 ENCOUNTER — Encounter: Payer: Self-pay | Admitting: Radiation Oncology

## 2012-02-23 DIAGNOSIS — Z923 Personal history of irradiation: Secondary | ICD-10-CM | POA: Insufficient documentation

## 2012-02-25 ENCOUNTER — Ambulatory Visit (HOSPITAL_COMMUNITY)
Admission: RE | Admit: 2012-02-25 | Discharge: 2012-02-25 | Disposition: A | Discharge status: Home / Self Care | Payer: 59 | Source: Ambulatory Visit | Attending: Internal Medicine | Admitting: Internal Medicine

## 2012-02-25 ENCOUNTER — Encounter (HOSPITAL_COMMUNITY): Payer: Self-pay

## 2012-02-25 VITALS — BP 128/66 | HR 67 | Resp 18 | Ht 68.0 in | Wt 254.8 lb

## 2012-02-25 DIAGNOSIS — C50919 Malignant neoplasm of unspecified site of unspecified female breast: Secondary | ICD-10-CM

## 2012-02-25 DIAGNOSIS — I517 Cardiomegaly: Secondary | ICD-10-CM

## 2012-02-25 DIAGNOSIS — I079 Rheumatic tricuspid valve disease, unspecified: Secondary | ICD-10-CM | POA: Insufficient documentation

## 2012-02-25 DIAGNOSIS — I379 Nonrheumatic pulmonary valve disorder, unspecified: Secondary | ICD-10-CM | POA: Insufficient documentation

## 2012-02-25 NOTE — Patient Instructions (Addendum)
Follow up in 3 months with ECHO

## 2012-02-25 NOTE — Progress Notes (Signed)
Patient ID: Tara [name], female   DOB: [birth year], 56 y.o.   MRN: [id number]  Referring Physician:  Magrinat Reason for Consultation: Herceptin monitoring HPI:  Tara [name] is a 56 y/o woman with h/o HCV, asthma, palpitations, chronic LE edema, TTP and obesity.  Recently found to have breast CA in [place], where she was residing until recently. She was set up for mammography 06/24/2011 at [facility] in [place], [place]. This showed a suspicious abnormality in the left breast measuring approximately 10 mm. Physical examination showed a 1 cm firm superficial mass in the axillary region of the breast and ultrasound showed this to be 11 mm and spiculated.  Biopsy was performed June 25, 2011, and showed ([id number] at the [facility]) and invasive ductal carcinoma, grade 3, estrogen receptor 95% positive, progesterone receptor 40% positive, with an MIB-1 of 29%. The tumor is HER 2 positive by FISH with a ratio of 2.5. There is tumor heterogeneity noted.  With this information the case was presented Jul 08, 2011 at the multidisciplinary breast cancer conference and ithe patient was evaluated that afternoon at the multidisciplinary breast cancer clinic. She proceeded to definitive left lumpectomy and sentinel lymph node sampling 08/03/2011. This confirmed a 1.6 cm invasive ductal carcinoma, grade 3. Margins were ample. The two sentinel lymph nodes were clear. The decision was made to proceed with 4 cycles of adjuvant chemotherapy consisting of docetaxel/cyclophosphamide given with trastuzumab every 3 weeks with Neulasta on day 2 for granulocyte support. Trastuzumab will be continued for total of one year.  She has now completed her adjuvant chemo. Now starting Herceptin q3weeks x 1 year +XRT. Presents for enrollment into cardio-onc clinic. Denies h/o HF or CAD. Has had palpitations in past and PCP in [place] placed her on digoxin. Also has chronic LE edema. Previously took lasix but no  longer taking. Feels she is getting more dyspneic lately. Hard to talk at times. Feels like it is her asthma. By time she gets her inhaler she feels better. No orthopnea or PND. No CP. Mild dyspnea on walking upstairs. ADLs OK. Snores if she is on her back.  Recent dig level 0.9  Echos reviewed personally in clinic  07/22/11: EF 60% Poor windows. Pseudonormal filling pattern. Unable to see s' well. ~8cm/sec 10/13/11: EF 60% Grade 1 DD. Lateral s' 9.8 cm/s 02/25/12 EF60%  lateral s' 10.1   She returns for follow up. Denies SOB/PND/Orthopnea. She has completed chemo and radiation but will continue Herceptin until 07/2012. Chronic lower extremity edema.      Review of Systems:     Cardiac Review of Systems: {Y] = yes [ ]  = no  Chest Pain [    ]  Resting SOB [   ] Exertional SOB  Cove.Etienne  ]  Orthopnea [  ]   Pedal Edema [   ]    Palpitations [  y] Syncope  [  ]   Presyncope [   ]  General Review of Systems: [Y] = yes [  ]=no Constitional: recent weight change [  ]; anorexia [  ]; fatigue [ y ]; nausea [ y ]; night sweats [  ]; fever [  ]; or chills [  ];  Dental: poor dentition[  ];  Eye : blurred vision [  ]; diplopia [   ]; vision changes [  ];  Amaurosis fugax[  ]; Resp: cough [  ];  wheezing[ y ];  hemoptysis[  ]; shortness of breath[y  ]; paroxysmal nocturnal dyspnea[  ]; dyspnea on exertion[y  ]; or orthopnea[  ];  GI:  gallstones[  ], vomiting[  ];  dysphagia[  ]; melena[  ];  hematochezia [  ]; heartburn[  ];   GU: kidney stones [  ]; hematuria[  ];   dysuria [  ];  nocturia[  ];  history of     obstruction [  ];                 Skin: rash, swelling[  ];, hair loss[  ];  peripheral edema[  ];  or itching[  ]; Musculosketetal: myalgias[  ];  joint swelling[  ];  joint erythema[  ];  joint pain[ y ];  back pain[  ];  Heme/Lymph: bruising[  ];  bleeding[  ];   anemia[  ];  Neuro: TIA[  ];  headaches[  ];  stroke[  ];  vertigo[  ];  seizures[  ];   paresthesias[  ];  difficulty walking[  ];  Psych:depression[  ]; anxiety[  ];  Endocrine: diabetes[  ];  thyroid dysfunction[  ];  Immunizations: Flu [  ]; Pneumococcal[  ];  Other:  Past Medical History  Diagnosis Date  . Thrombocytopenia, primary   . Hepatitis C   . Asthma   . Arthritis   . Complication of anesthesia     difficulty waking up/dizzy/lightheaded  . Environmental allergies   . Shortness of breath   . Hypothyroidism   . TTP (thrombotic thrombocytopenic purpura)   . Blood transfusion   . Anxiety   . Dysrhythmia     irregular heartbeat, takes digoxin  . Chronic edema   . Breast cancer 2012    left breast  . Hx of radiation therapy 12/15/11 - 01/29/12    left breast     (Not in a hospital admission)     Allergies  Allergen Reactions  . Aspirin     Hx of TTP  . Nsaids     Hx TTP    History   Social History  . Marital Status: Married    Spouse Name: N/A    Number of Children: N/A  . Years of Education: N/A   Occupational History  . Not on file.   Social History Main Topics  . Smoking status: Never Smoker   . Smokeless tobacco: Never Used  . Alcohol Use: 0.6 oz/week    1 Glasses of wine per week  . Drug Use: No  . Sexually Active: Not Currently   Other Topics Concern  . Not on file   Social History Narrative  . No narrative on file    Family History  Problem Relation Age of Onset  . Cancer Maternal Grandfather     brain  . Alcohol abuse Maternal Grandfather   . Heart disease Neg Hx   . Hyperlipidemia Neg Hx   . Hypertension Neg Hx     PHYSICAL EXAM: Filed Vitals:   02/25/12 1543  BP: 128/66  Pulse: 67  Resp: 18    General:  Well appearing. No respiratory difficulty HEENT: normal. alopecic Neck: supple. no JVD. Carotids 2+ bilat; no bruits. No lymphadenopathy or thryomegaly appreciated. Cor: PMI nondisplaced. Regular rate & rhythm.  No rubs or murmurs. +s4 Lungs: clear Abdomen: obese soft, nontender, nondistended. No hepatosplenomegaly. No bruits or masses. Good bowel sounds. Extremities: large. no cyanosis, clubbing, rash, edema Neuro: alert & oriented x 3, cranial nerves grossly intact. moves all 4 extremities w/o difficulty. Affect pleasant.   ASSESSMENT/PLAN:

## 2012-02-25 NOTE — Assessment & Plan Note (Addendum)
ECHOs reviewed during office visit. EF and lateral S' stable. No evidence of cardiotoxicity. Follow up in 3 months.   Patient seen and examined with Tonye Becket, NP. We discussed all aspects of the encounter. I agree with the assessment and plan as stated above.  Echo images reviewed personally. All parameters stable. Reviewed signs and symptoms of HF to look for. Continue Herceptin. Follow-up with echo in 3 months.

## 2012-02-25 NOTE — Progress Notes (Signed)
  Echocardiogram 2D Echocardiogram has been performed.  Tara [name] 02/25/2012, 4:05 PM

## 2012-02-29 ENCOUNTER — Telehealth: Payer: Self-pay | Admitting: *Deleted

## 2012-02-29 ENCOUNTER — Ambulatory Visit (HOSPITAL_BASED_OUTPATIENT_CLINIC_OR_DEPARTMENT_OTHER): Payer: 59 | Admitting: Physician Assistant

## 2012-02-29 ENCOUNTER — Encounter: Payer: Self-pay | Admitting: Physician Assistant

## 2012-02-29 ENCOUNTER — Other Ambulatory Visit: Payer: 59 | Admitting: Lab

## 2012-02-29 VITALS — BP 128/72 | HR 92 | Temp 98.7°F | Ht 68.0 in | Wt 253.7 lb

## 2012-02-29 DIAGNOSIS — Z170 Estrogen receptor positive status [ER+]: Secondary | ICD-10-CM

## 2012-02-29 DIAGNOSIS — C50519 Malignant neoplasm of lower-outer quadrant of unspecified female breast: Secondary | ICD-10-CM

## 2012-02-29 DIAGNOSIS — C50919 Malignant neoplasm of unspecified site of unspecified female breast: Secondary | ICD-10-CM

## 2012-02-29 LAB — COMPREHENSIVE METABOLIC PANEL
ALT: 52 U/L — ABNORMAL HIGH (ref 0–35)
AST: 48 U/L — ABNORMAL HIGH (ref 0–37)
Albumin: 4.2 g/dL (ref 3.5–5.2)
Alkaline Phosphatase: 77 U/L (ref 39–117)
BUN: 12 mg/dL (ref 6–23)
CO2: 27 mEq/L (ref 19–32)
Calcium: 9 mg/dL (ref 8.4–10.5)
Chloride: 101 mEq/L (ref 96–112)
Creatinine, Ser: 0.76 mg/dL (ref 0.50–1.10)
Glucose, Bld: 123 mg/dL — ABNORMAL HIGH (ref 70–99)
Potassium: 4.2 mEq/L (ref 3.5–5.3)
Sodium: 140 mEq/L (ref 135–145)
Total Bilirubin: 0.7 mg/dL (ref 0.3–1.2)
Total Protein: 6.6 g/dL (ref 6.0–8.3)

## 2012-02-29 LAB — CBC WITH DIFFERENTIAL/PLATELET
BASO%: 0.5 % (ref 0.0–2.0)
Basophils Absolute: 0 10*3/uL (ref 0.0–0.1)
EOS%: 3.6 % (ref 0.0–7.0)
Eosinophils Absolute: 0.2 10*3/uL (ref 0.0–0.5)
HCT: 38.1 % (ref 34.8–46.6)
HGB: 12.7 g/dL (ref 11.6–15.9)
LYMPH%: 9.3 % — ABNORMAL LOW (ref 14.0–49.7)
MCH: 31.9 pg (ref 25.1–34.0)
MCHC: 33.3 g/dL (ref 31.5–36.0)
MCV: 95.8 fL (ref 79.5–101.0)
MONO#: 0.4 10*3/uL (ref 0.1–0.9)
MONO%: 7 % (ref 0.0–14.0)
NEUT#: 4.4 10*3/uL (ref 1.5–6.5)
NEUT%: 79.6 % — ABNORMAL HIGH (ref 38.4–76.8)
Platelets: 136 10*3/uL — ABNORMAL LOW (ref 145–400)
RBC: 3.98 10*6/uL (ref 3.70–5.45)
RDW: 14.3 % (ref 11.2–14.5)
WBC: 5.6 10*3/uL (ref 3.9–10.3)
lymph#: 0.5 10*3/uL — ABNORMAL LOW (ref 0.9–3.3)

## 2012-02-29 NOTE — Progress Notes (Signed)
ID: Tara [name]   DOB: 1956-03-26  MR#: [id number]  NWG#:[id number]  HISTORY OF PRESENT ILLNESS: Tara [name] noted a mass in her left breast and brought it to her physician's attention in [place], where she was residing. She was set up for mammography 06/24/2011 at [facility] in [place], [place]. This showed a suspicious abnormality in the left breast measuring approximately 10 mm. Physical examination showed a 1 cm firm superficial mass in the axillary region of the breast and ultrasound showed this to be 11 mm and spiculated.  Biopsy was performed June 25, 2011, and showed ([id number] at the [facility]) and invasive ductal carcinoma, grade 3, estrogen receptor 95% positive, progesterone receptor 40% positive, with an MIB-1 of 29%. The tumor is HER 2 positive by FISH with a ratio of 2.5. There is tumor heterogeneity noted.  With this information the case was presented Jul 08, 2011 at the multidisciplinary  breast cancer clinic. She proceeded to definitive left lumpectomy and sentinel lymph node sampling 08/03/2011. This confirmed a 1.6 cm invasive ductal carcinoma, grade 3. Margins were ample. The two sentinel lymph nodes were clear.  Her subsequent history is as detailed below.  INTERVAL HISTORY: Tara [name] returns today for followup of her breast carcinoma. She continues to receive trastuzumab every 3 weeks, and continues on anastrozole as well. She's tolerating both agents well with few complaints.  Interval history is remarkable for [facility] having had her left mammogram at [facility] on 02/18/2012 which was unremarkable. She has also been seen by Dr.  Gala Romney to review her recent echocardiogram which was also normal. [name] was seen here in early June for an upper respiratory infection which has resolved. She is feeling better, and is staying busy. She and her husband are checking out lots of the local parks with her new dog.  REVIEW OF SYSTEMS: Tara [name] denies any recent fevers or  chills. No significant hot flashes. She does have some vaginal dryness. She's had no additional nausea, emesis, or diarrhea. No abdominal cramping or pain. No abnormal headaches. She has some occasional pain in her left hip, but no increased arthralgias since beginning the anastrozole. No increased pedal edema. No orthopnea, cough or shortness of breath. No chest pain.   Otherwise a detailed review of systems is noncontributory.    PAST MEDICAL HISTORY: Past Medical History  Diagnosis Date  . Thrombocytopenia, primary   . Hepatitis C   . Asthma   . Arthritis   . Complication of anesthesia     difficulty waking up/dizzy/lightheaded  . Environmental allergies   . Shortness of breath   . Hypothyroidism   . TTP (thrombotic thrombocytopenic purpura)   . Blood transfusion   . Anxiety   . Dysrhythmia     irregular heartbeat, takes digoxin  . Chronic edema   . Breast cancer 2012    left breast  . Hx of radiation therapy 12/15/11 - 01/29/12    left breast   PAST SURGICAL HISTORY: Past Surgical History  Procedure Date  . Elbow pins     left  . Right hip replacement   . Left foot reconstruction   . Ortho left foot surg    . Joint replacement     bilat hip  . Portacath placement 08/03/2011    Procedure: INSERTION PORT-A-CATH;  Surgeon: Robyne Askew, MD;  Location: [facility] OR;  Service: General;  Laterality: Right;  . Breast lumpectomy 08/03/11    left lumpectomy and slnbx,T1cN0,triple pos    FAMILY HISTORY Family  History  Problem Relation Age of Onset  . Cancer Maternal Grandfather     brain  . Alcohol abuse Maternal Grandfather   . Heart disease Neg Hx   . Hyperlipidemia Neg Hx   . Hypertension Neg Hx   Patient's father died at the age of 54 from emphysema in the setting of tobacco abuse patient's mother is alive at 76 the patient has one brother and one sister there is no history of breast or ovarian cancer in the immediate family    Gynecologic history: She is GX P0 menarche  age 65 most recent period July of 2008 she never took hormone replacement   Social History: the patient is a homemaker; she used to work in an office previously. Her husband Tara [name] is a Magazine features editor. They moved here from [facility] Oct 2012.    ADVANCED DIRECTIVES:  HEALTH MAINTENANCE: History  Substance Use Topics  . Smoking status: Never Smoker   . Smokeless tobacco: Never Used  . Alcohol Use: 0.6 oz/week    1 Glasses of wine per week     Colonoscopy:  PAP: Oct 2012  Bone density: Nov 2011/ nl  Lipid panel:  Allergies  Allergen Reactions  . Aspirin     Hx of TTP  . Nsaids     Hx TTP    Current Outpatient Prescriptions  Medication Sig Dispense Refill  . acetaminophen (TYLENOL) 500 MG tablet Take 500 mg by mouth every 6 (six) hours as needed. For pain       . albuterol (PROVENTIL HFA;VENTOLIN HFA) 108 (90 BASE) MCG/ACT inhaler Inhale 2 puffs into the lungs every 6 (six) hours as needed. For shortness of breath       . anastrozole (ARIMIDEX) 1 MG tablet Take 1 mg by mouth daily.      . cholecalciferol (VITAMIN D) 1000 UNITS tablet Take 2,000 Units by mouth daily.        . citalopram (CELEXA) 20 MG tablet TAKE 1 TABLET BY MOUTH EVERY DAILY  90 tablet  1  . clobetasol ointment (TEMOVATE) 0.05 % Apply 1 application topically daily as needed. For psoraisis       . digoxin (LANOXIN) 0.25 MG tablet Take 250 mcg by mouth daily.       . fluticasone (FLONASE) 50 MCG/ACT nasal spray Place 2 sprays into the nose daily.  1 g  0  . fluticasone (FLONASE) 50 MCG/ACT nasal spray Place 2 sprays into the nose daily as needed. For congestion  16 g  0  . glucosamine-chondroitin 500-400 MG tablet Take 1 tablet by mouth daily.       . Halcinonide (HALOG) 0.1 % CREA Apply 1 application topically daily as needed. For eczema      . levothyroxine (SYNTHROID, LEVOTHROID) 50 MCG tablet TAKE 1 TABLET BY MOUTH EVERY DAY  30 tablet  0  . lidocaine-prilocaine (EMLA) cream Apply topically as needed.        .  loperamide (IMODIUM A-D) 2 MG tablet Take 2 mg by mouth 4 (four) times daily as needed. For diarrhea       . Loratadine (CLARITIN) 10 MG CAPS Take by mouth as needed.      . metroNIDAZOLE (METROCREAM) 0.75 % cream Apply topically 2 (two) times daily.  45 g  2  . montelukast (SINGULAIR) 10 MG tablet Take 10 mg by mouth at bedtime.        . non-metallic deodorant Thornton Papas) MISC Apply 1 application topically daily as needed.      Marland Kitchen  ondansetron (ZOFRAN) 8 MG tablet Take 1 tablet (8 mg total) by mouth every 12 (twelve) hours as needed for nausea.  30 tablet  2  . OVER THE COUNTER MEDICATION Take 1 tablet by mouth daily as needed. For gas pain      . pimecrolimus (ELIDEL) 1 % cream Apply 1 application topically 2 (two) times daily as needed. For exczema       . prochlorperazine (COMPAZINE) 10 MG tablet Take 1 tablet (10 mg total) by mouth every 6 (six) hours as needed.  30 tablet  2  . PULMICORT FLEXHALER 180 MCG/ACT inhaler INHALE 2 PUFFS BY MOUTH EVERY MORNING  3 Inhaler  0  . tobramycin-dexamethasone (TOBRADEX) ophthalmic solution Place 1 drop into both eyes 2 (two) times daily as needed.        . vitamin B-12 (CYANOCOBALAMIN) 100 MCG tablet Take 2,000 mcg by mouth daily.       . Wound Cleansers (RADIAPLEX EX) Apply topically.        OBJECTIVE: Middle-aged white woman in no acute distress Filed Vitals:   02/29/12 1437  BP: 128/72  Pulse: 92  Temp: 98.7 F (37.1 C)     Body mass index is 38.58 kg/(m^2).    ECOG FS: 1  Physical Exam: HEENT:  Sclerae anicteric, conjunctivae pink.  Oropharynx clear.  No pharyngeal erythema or exudate. Nodes:  No cervical, supraclavicular, or axillary lymphadenopathy palpated.  Breast Exam:  Right breast is benign no masses, skin changes, or nipple inversion. Left breast is status post lumpectomy and radiation. Mild hyperpigmentation status post radiation therapy. There is a small area of telangiectasias in the inframammary fold. No additional skin changes noted. No  suspicious nodularity. No evidence of local recurrence. Lungs:  Clear to auscultation bilaterally.  No crackles, rhonchi, or wheezes.   Heart:  Regular rate and rhythm.   Abdomen:  Soft, obese, nontender.  Positive bowel sounds.  No organomegaly or masses palpated.   Musculoskeletal:  No focal spinal tenderness to palpation.  Extremities:  Benign.  No peripheral edema or cyanosis.   Neuro:  Nonfocal. Alert and oriented x3.     LAB RESULTS: Lab Results  Component Value Date   WBC 5.6 02/29/2012   NEUTROABS 4.4 02/29/2012   HGB 12.7 02/29/2012   HCT 38.1 02/29/2012   MCV 95.8 02/29/2012   PLT 136* 02/29/2012      Chemistry      Component Value Date/Time   NA 139 02/12/2012 1004   K 4.3 02/12/2012 1004   CL 103 02/12/2012 1004   CO2 24 02/12/2012 1004   BUN 12 02/12/2012 1004   CREATININE 0.74 02/12/2012 1004      Component Value Date/Time   CALCIUM 8.9 02/12/2012 1004   ALKPHOS 84 02/12/2012 1004   AST 78* 02/12/2012 1004   ALT 85* 02/12/2012 1004   BILITOT 0.7 02/12/2012 1004       Lab Results  Component Value Date   LABCA2 29 02/03/2012     STUDIES: 2-D echocardiogram on 10/13/11 showed an ejection fraction of 60%. Next echo scheduled for 02/25/12.   ASSESSMENT: 56 year old [place] woman originally from [place]   1. Status post left lumpectomy and sentinel lymph node sampling 08/03/2011 for a T1C N0 (Stage I) invasive ductal carcinoma, high-grade, triple positive.   2. treated in the adjuvant setting with 4 doses of docetaxel and cyclophosphamide completed 11/16/2011 given along with trastuzumab    3. Trastuzumab to be continued for total of one year (to mid-January  2014)  4. Status post radiation therapy, completed late May of 2013.  5. began on anastrozole in early June 2013, 1 mg daily.   7. Comorbidities include a history of remote thrombotic thrombocytopenic purpura and hepatitis C.    PLAN: With regards to her breast cancer, Jennefer appears to be doing quite well, and is  tolerating both the anastrozole and trastuzumab well.  She returned for her next q. three-week dose of trastuzumab next Monday, July 8, then every 3 weeks after that. I will plan on seeing her for routine followup in early September. She also to be due for her next echocardiogram and followup with Dr. Gala Romney in late September.  Stephanye voices understanding and agreement with our plan, and will call with any changes or problems.   Ekam Besson    02/29/2012

## 2012-02-29 NOTE — Telephone Encounter (Signed)
[name] email to set up patient's treatments told patient to come back by on 03-07-2012 to pick up her calendar

## 2012-02-29 NOTE — Telephone Encounter (Signed)
Per staff message I have scheduled appts. JMW  

## 2012-03-01 ENCOUNTER — Ambulatory Visit
Admission: RE | Admit: 2012-03-01 | Discharge: 2012-03-01 | Disposition: A | Discharge status: Home / Self Care | Payer: 59 | Source: Ambulatory Visit | Attending: Radiation Oncology | Admitting: Radiation Oncology

## 2012-03-01 VITALS — BP 126/88 | HR 94 | Temp 98.5°F | Wt 251.6 lb

## 2012-03-01 DIAGNOSIS — C50919 Malignant neoplasm of unspecified site of unspecified female breast: Secondary | ICD-10-CM

## 2012-03-01 NOTE — Progress Notes (Signed)
Patient here for routine one month follow up post left breast radiation.Completed total of 4680 cGy. Has some hyperpigmentation of skin without peeling. Has started arimidex daily.Continues to get herceptin every three weeks through end of December 2013. Continued mild fatigue relieved with naps.

## 2012-03-01 NOTE — Progress Notes (Signed)
Followup note:  Ms. Tara [name] returns today approximately 1 month following completion of radiation therapy to her left breast the following conservative surgery and adjuvant chemotherapy for stage I (T1, N0, M0) invasive ductal/DCIS of the left breast. She still doing well and continues with her trastuzumab and anastrozole a. She will see Olena Heckle, P. A again in September after being seen by her yesterday. She had a left breast mammogram at [facility] on 02/18/2012 and this was unremarkable. She is very least by physical therapy for her left upper extremity lymphedema. She has been measured for an elastic sleeve to use when she flies.  Physical examination: Alert and oriented. She is in good spirits. Vital signs: Wt Readings from Last 3 Encounters:  03/01/12 251 lb 9.6 oz (114.125 kg)  02/29/12 253 lb 11.2 oz (115.078 kg)  02/25/12 254 lb 12.8 oz (115.577 kg)   Temp Readings from Last 3 Encounters:  03/01/12 98.5 F (36.9 C)   02/29/12 98.7 F (37.1 C) Oral  02/12/12 98 F (36.7 C) Oral   BP Readings from Last 3 Encounters:  03/01/12 126/88  02/29/12 128/72  02/25/12 128/66   Pulse Readings from Last 3 Encounters:  03/01/12 94  02/29/12 92  02/25/12 67   Head and neck examination: Grossly unremarkable. Nodes: Without palpable cervical, supraclavicular, or axillary lymphadenopathy. Chest: Lungs clear. Breasts: There is residual hyperpigmentation the skin along the left breast. There is patchy dry desquamation. Mild thickening of the left breast, no masses are appreciated. Right breast without masses or lesions. Abdomen without hepatomegaly. Extremities there is trace to 1+ left upper extremity lymphedema.  Impression: Satisfactory progress.  Plan:  She'll maintain her followup with Amy Barry,P.A. in September. She'll see Dr. Carolynne Edouard for a followup visit on July 26.

## 2012-03-04 ENCOUNTER — Ambulatory Visit: Payer: Managed Care, Other (non HMO)

## 2012-03-07 ENCOUNTER — Encounter: Payer: Self-pay | Admitting: Dietician

## 2012-03-07 ENCOUNTER — Ambulatory Visit (HOSPITAL_BASED_OUTPATIENT_CLINIC_OR_DEPARTMENT_OTHER): Payer: 59

## 2012-03-07 VITALS — BP 111/70 | HR 76 | Temp 97.5°F

## 2012-03-07 DIAGNOSIS — Z5112 Encounter for antineoplastic immunotherapy: Secondary | ICD-10-CM

## 2012-03-07 DIAGNOSIS — C50919 Malignant neoplasm of unspecified site of unspecified female breast: Secondary | ICD-10-CM

## 2012-03-07 DIAGNOSIS — C50419 Malignant neoplasm of upper-outer quadrant of unspecified female breast: Secondary | ICD-10-CM

## 2012-03-07 MED ORDER — SODIUM CHLORIDE 0.9 % IV SOLN
Freq: Once | INTRAVENOUS | Status: AC
  Administered 2012-03-07: 10:00:00 via INTRAVENOUS

## 2012-03-07 MED ORDER — ACETAMINOPHEN 325 MG PO TABS
650.0000 mg | ORAL_TABLET | Freq: Once | ORAL | Status: AC
  Administered 2012-03-07: 650 mg via ORAL

## 2012-03-07 MED ORDER — HEPARIN SOD (PORK) LOCK FLUSH 100 UNIT/ML IV SOLN
500.0000 [IU] | Freq: Once | INTRAVENOUS | Status: AC | PRN
  Administered 2012-03-07: 500 [IU]
  Filled 2012-03-07: qty 5

## 2012-03-07 MED ORDER — DIPHENHYDRAMINE HCL 25 MG PO CAPS
25.0000 mg | ORAL_CAPSULE | Freq: Once | ORAL | Status: AC
  Administered 2012-03-07: 25 mg via ORAL

## 2012-03-07 MED ORDER — TRASTUZUMAB CHEMO INJECTION 440 MG
6.0000 mg/kg | Freq: Once | INTRAVENOUS | Status: AC
  Administered 2012-03-07: 693 mg via INTRAVENOUS
  Filled 2012-03-07: qty 33

## 2012-03-07 MED ORDER — SODIUM CHLORIDE 0.9 % IJ SOLN
10.0000 mL | INTRAMUSCULAR | Status: DC | PRN
  Administered 2012-03-07: 10 mL
  Filled 2012-03-07: qty 10

## 2012-03-07 NOTE — Progress Notes (Signed)
Brief Out-patient Oncology Nutrition Note  Reason: Patient screened positive for nutrition risk for unintentional weight loss and decreased appetite.   Ms. Tara [name] is a 56 year old female patient of Dr. Dayton Scrape, diagnosed with Breast cancer. Contacted patient for nutrition risk. Patient reported appetite and intake are well. She stated she had a stomach virus that caused her to loose weight, but she has gained it back now. She is without any nutrition related questions.   RD available for nutrition needs.   Iven Finn [facility] [phone]

## 2012-03-08 ENCOUNTER — Telehealth: Payer: Self-pay | Admitting: *Deleted

## 2012-03-08 NOTE — Telephone Encounter (Signed)
Spoke with Tara [name] patient is on the re-call list for [phone] for echo and dr.[email or web address]

## 2012-03-15 ENCOUNTER — Other Ambulatory Visit: Payer: Self-pay | Admitting: Physician Assistant

## 2012-03-17 ENCOUNTER — Other Ambulatory Visit: Payer: Self-pay | Admitting: Physician Assistant

## 2012-03-17 ENCOUNTER — Other Ambulatory Visit: Payer: Self-pay | Admitting: Oncology

## 2012-03-19 ENCOUNTER — Other Ambulatory Visit: Payer: Self-pay | Admitting: Physician Assistant

## 2012-03-21 ENCOUNTER — Other Ambulatory Visit: Payer: Self-pay

## 2012-03-21 DIAGNOSIS — E039 Hypothyroidism, unspecified: Secondary | ICD-10-CM

## 2012-03-21 MED ORDER — LEVOTHYROXINE SODIUM 50 MCG PO TABS: ORAL_TABLET | ORAL | Status: DC

## 2012-03-22 ENCOUNTER — Encounter (INDEPENDENT_AMBULATORY_CARE_PROVIDER_SITE_OTHER): Payer: Managed Care, Other (non HMO) | Admitting: General Surgery

## 2012-03-23 ENCOUNTER — Encounter: Payer: Self-pay | Admitting: Obstetrics and Gynecology

## 2012-03-23 ENCOUNTER — Ambulatory Visit (INDEPENDENT_AMBULATORY_CARE_PROVIDER_SITE_OTHER): Payer: 59 | Admitting: Obstetrics and Gynecology

## 2012-03-23 VITALS — BP 108/64 | Ht 68.0 in | Wt 248.0 lb

## 2012-03-23 DIAGNOSIS — N764 Abscess of vulva: Secondary | ICD-10-CM

## 2012-03-23 DIAGNOSIS — L0291 Cutaneous abscess, unspecified: Secondary | ICD-10-CM

## 2012-03-23 DIAGNOSIS — L0390 Cellulitis, unspecified: Secondary | ICD-10-CM

## 2012-03-23 MED ORDER — CIPROFLOXACIN HCL 500 MG PO TABS: 500.0000 mg | ORAL_TABLET | Freq: Two times a day (BID) | ORAL | Status: AC

## 2012-03-23 NOTE — Progress Notes (Signed)
NEW GYNECOLOGIC EXAMINATION  Ms. Tara [name] is an 56 y.o. female, G0P0000, who presents to the [facility] division of [facility] for a new patient gynecologic examination. She complains of a tender cyst on her left vulva that began draining several days ago.  She has a history of breast cancer.  She has completed radiation.  She continues with chemotherapy.  She has a Port-A-Cath in place.  Her complains are:see above .     Pertinent Gynecological History: No LMP recorded. Patient is postmenopausal. Menses: post-menopausal Menarche: 11 Contraception: post menopausal status DES exposure: unknown Blood transfusions: not assessed Sexually transmitted diseases: The patient denies history of sexually transmitted disease. Previous GYN Procedures: none  Last mammogram: normal Date: May 2013, no evidence of breast cancer at this point Last pap: normal Date: 2012 History of Abnormal Pap Smears:  No   Obstetrical History:  Vaginal Deliveries at Term:      0 Preterm Vaginal Deliveries:      0 Cesarean Deliveries at Term:  0 Preterm Cesareans:                 0 Miscarriages:                            0 Abortions:                                  0    Past Medical History  Diagnosis Date  . Thrombocytopenia, primary   . Hepatitis C   . Asthma   . Arthritis   . Complication of anesthesia     difficulty waking up/dizzy/lightheaded  . Environmental allergies   . Shortness of breath   . Hypothyroidism   . TTP (thrombotic thrombocytopenic purpura)   . Blood transfusion   . Anxiety   . Dysrhythmia     irregular heartbeat, takes digoxin  . Chronic edema   . Breast cancer 2012    left breast  . Hx of radiation therapy 12/15/11 - 01/29/12    left breast    Past Surgical History  Procedure Date  . Elbow pins     left  . Right hip replacement   . Left foot reconstruction   . Ortho left foot surg    . Joint replacement     bilat hip  . Portacath  placement 08/03/2011    Procedure: INSERTION PORT-A-CATH;  Surgeon: Robyne Askew, MD;  Location: [facility] OR;  Service: General;  Laterality: Right;  . Breast lumpectomy 08/03/11    left lumpectomy and slnbx,T1cN0,triple pos    Family History  Problem Relation Age of Onset  . Cancer Maternal Grandfather     brain  . Alcohol abuse Maternal Grandfather   . Heart disease Neg Hx   . Hyperlipidemia Neg Hx   . Hypertension Neg Hx     Social History:  reports that she has never smoked. She has never used smokeless tobacco. She reports that she drinks about .6 ounces of alcohol per week. She reports that she does not use illicit drugs.  Allergies:  Allergies  Allergen Reactions  . Aspirin     Hx of TTP  . Nsaids     Hx TTP    Medications: I have reviewed the patient's current medications.  Review of Systems:  See history of present illness and gynecologic history. See above  Family History: noncontributory  Physical Examination:  Blood pressure 108/64, height 5\' 8"  (1.727 m), weight 248 lb (112.492 kg). Body mass index is 37.71 kg/(m^2).  General: alert and no distress Resp: clear to auscultation bilaterally Cardio: regular rate and rhythm, S1, S2 normal, no murmur, click, rub or gallop,Port-A-Cath in place GI: soft, non-tender; bowel sounds normal; no masses,  no organomegaly  External genitalia: Nontender, draining abscess on the left perineum.  Induration noted.  Area cleaned with Betadine. Vagina: atrophic, relaxation: yes Cervix: Nontender, No lesions Uterus: Normal size, shape, and consistency Adnexa: No masses, nontender Rectovaginal exam:Deferred  No results found for this or any previous visit (from the past 48 hour(s)).  Wet Prep: None  Urine Pregnancy Test: None  Assessment:  Normal female examination  Overweight or obese: Yes   Pelvic relaxation: Yes  Draining vulvar abscess  Breast cancer  Multiple medical problems   Plan:    Continue to soak  in hot water.  Ciprofloxacin 500 mg twice a day for 7 days  Contraception:no method    STD screen request: none  RPR: No.   HBsAg: No.  Hepatitis C: No.  The updated Pap smear screening guidelines were discussed with the patient. The patient requested that I obtain a Pap smear: No.  Kegel exercises discussed: Yes.  Proper diet and regular exercise were reviewed.  Annual mammograms recommended starting at age 12. Proper breast care was discussed.  Screening colonoscopy is recommended beginning at age 75.  Medications Prescribed:  ciprofloxacin  Return to Office:  October 2013 for annual exam.  Leonard Schwartz, M.D. 03/23/2012

## 2012-03-25 ENCOUNTER — Ambulatory Visit (INDEPENDENT_AMBULATORY_CARE_PROVIDER_SITE_OTHER): Payer: 59 | Admitting: General Surgery

## 2012-03-25 ENCOUNTER — Encounter (INDEPENDENT_AMBULATORY_CARE_PROVIDER_SITE_OTHER): Payer: Self-pay | Admitting: General Surgery

## 2012-03-25 VITALS — BP 126/89 | HR 74 | Temp 97.0°F | Resp 18 | Ht 68.0 in | Wt 246.2 lb

## 2012-03-25 DIAGNOSIS — C50919 Malignant neoplasm of unspecified site of unspecified female breast: Secondary | ICD-10-CM

## 2012-03-25 NOTE — Patient Instructions (Signed)
Continue regular self exams Continue arimidex 

## 2012-03-25 NOTE — Progress Notes (Signed)
Subjective:     Patient ID: Tara [name], female   DOB: [birth year], 56 y.o.   MRN: [id number]  HPI The patient is a 56 year old white female who is 8 months status post left breast lobectomy and negative sentinel node biopsy for a T1 C. N0 left breast cancer. She was triple positive. She finished her radiation therapy at the end of May. She is continuing to get Herceptin. She is tolerating this well and has no complaints today.  Review of Systems  Constitutional: Negative.   HENT: Negative.   Eyes: Negative.   Respiratory: Negative.   Cardiovascular: Negative.   Gastrointestinal: Negative.   Genitourinary: Negative.   Musculoskeletal: Negative.   Skin: Negative.   Neurological: Negative.   Hematological: Negative.   Psychiatric/Behavioral: Negative.        Objective:   Physical Exam  Constitutional: She is oriented to person, place, and time. She appears well-developed and well-nourished.  HENT:  Head: Normocephalic and atraumatic.  Eyes: Conjunctivae and EOM are normal. Pupils are equal, round, and reactive to light.  Neck: Normal range of motion. Neck supple.  Cardiovascular: Normal rate, regular rhythm and normal heart sounds.   Pulmonary/Chest: Effort normal and breath sounds normal.       There is no palpable mass in either breast. No palpable axillary supraclavicular or cervical lymphadenopathy.  Abdominal: Soft. Bowel sounds are normal. She exhibits no mass. There is no tenderness.  Musculoskeletal: Normal range of motion.       No significant lymphedema the left arm  Lymphadenopathy:    She has no cervical adenopathy.  Neurological: She is alert and oriented to person, place, and time.  Skin: Skin is warm and dry.  Psychiatric: She has a normal mood and affect. Her behavior is normal.       Assessment:     8 months status post left breast lumpectomy and negative sentinel node biopsy    Plan:     At this point she is doing well. She will continue to do regular  self exams. She will continue to take Arimidex. We will plan to see her back in about 4 months.

## 2012-03-28 ENCOUNTER — Other Ambulatory Visit (HOSPITAL_BASED_OUTPATIENT_CLINIC_OR_DEPARTMENT_OTHER): Payer: 59 | Admitting: Lab

## 2012-03-28 ENCOUNTER — Ambulatory Visit (HOSPITAL_BASED_OUTPATIENT_CLINIC_OR_DEPARTMENT_OTHER): Payer: 59

## 2012-03-28 VITALS — BP 132/72 | HR 70 | Temp 98.5°F

## 2012-03-28 DIAGNOSIS — C50919 Malignant neoplasm of unspecified site of unspecified female breast: Secondary | ICD-10-CM

## 2012-03-28 DIAGNOSIS — C50419 Malignant neoplasm of upper-outer quadrant of unspecified female breast: Secondary | ICD-10-CM

## 2012-03-28 DIAGNOSIS — Z5112 Encounter for antineoplastic immunotherapy: Secondary | ICD-10-CM

## 2012-03-28 LAB — COMPREHENSIVE METABOLIC PANEL
ALT: 32 U/L (ref 0–35)
AST: 33 U/L (ref 0–37)
Albumin: 3.7 g/dL (ref 3.5–5.2)
Alkaline Phosphatase: 66 U/L (ref 39–117)
BUN: 10 mg/dL (ref 6–23)
CO2: 24 mEq/L (ref 19–32)
Calcium: 8.7 mg/dL (ref 8.4–10.5)
Chloride: 104 mEq/L (ref 96–112)
Creatinine, Ser: 0.74 mg/dL (ref 0.50–1.10)
Glucose, Bld: 114 mg/dL — ABNORMAL HIGH (ref 70–99)
Potassium: 4.1 mEq/L (ref 3.5–5.3)
Sodium: 140 mEq/L (ref 135–145)
Total Bilirubin: 0.6 mg/dL (ref 0.3–1.2)
Total Protein: 6.4 g/dL (ref 6.0–8.3)

## 2012-03-28 LAB — CBC WITH DIFFERENTIAL/PLATELET
BASO%: 0.4 % (ref 0.0–2.0)
Basophils Absolute: 0 10*3/uL (ref 0.0–0.1)
EOS%: 5.2 % (ref 0.0–7.0)
Eosinophils Absolute: 0.3 10*3/uL (ref 0.0–0.5)
HCT: 37.9 % (ref 34.8–46.6)
HGB: 12.6 g/dL (ref 11.6–15.9)
LYMPH%: 11.6 % — ABNORMAL LOW (ref 14.0–49.7)
MCH: 31.1 pg (ref 25.1–34.0)
MCHC: 33.2 g/dL (ref 31.5–36.0)
MCV: 93.6 fL (ref 79.5–101.0)
MONO#: 0.3 10*3/uL (ref 0.1–0.9)
MONO%: 5.2 % (ref 0.0–14.0)
NEUT#: 3.9 10*3/uL (ref 1.5–6.5)
NEUT%: 77.6 % — ABNORMAL HIGH (ref 38.4–76.8)
Platelets: 153 10*3/uL (ref 145–400)
RBC: 4.05 10*6/uL (ref 3.70–5.45)
RDW: 14.5 % (ref 11.2–14.5)
WBC: 5 10*3/uL (ref 3.9–10.3)
lymph#: 0.6 10*3/uL — ABNORMAL LOW (ref 0.9–3.3)
nRBC: 0 % (ref 0–0)

## 2012-03-28 MED ORDER — SODIUM CHLORIDE 0.9 % IJ SOLN
10.0000 mL | INTRAMUSCULAR | Status: DC | PRN
  Administered 2012-03-28: 10 mL
  Filled 2012-03-28: qty 10

## 2012-03-28 MED ORDER — SODIUM CHLORIDE 0.9 % IV SOLN
6.0000 mg/kg | Freq: Once | INTRAVENOUS | Status: AC
  Administered 2012-03-28: 693 mg via INTRAVENOUS
  Filled 2012-03-28: qty 33

## 2012-03-28 MED ORDER — HEPARIN SOD (PORK) LOCK FLUSH 100 UNIT/ML IV SOLN
500.0000 [IU] | Freq: Once | INTRAVENOUS | Status: AC | PRN
  Administered 2012-03-28: 500 [IU]
  Filled 2012-03-28: qty 5

## 2012-03-28 MED ORDER — ACETAMINOPHEN 325 MG PO TABS
650.0000 mg | ORAL_TABLET | Freq: Once | ORAL | Status: AC
  Administered 2012-03-28: 650 mg via ORAL

## 2012-03-28 MED ORDER — DIPHENHYDRAMINE HCL 25 MG PO CAPS
25.0000 mg | ORAL_CAPSULE | Freq: Once | ORAL | Status: AC
  Administered 2012-03-28: 25 mg via ORAL

## 2012-03-28 MED ORDER — SODIUM CHLORIDE 0.9 % IV SOLN
Freq: Once | INTRAVENOUS | Status: AC
  Administered 2012-03-28: 10:00:00 via INTRAVENOUS

## 2012-03-28 NOTE — Patient Instructions (Signed)
Patient aware of next appointment; discharged home with no complaints. 

## 2012-04-14 ENCOUNTER — Telehealth: Payer: Self-pay | Admitting: *Deleted

## 2012-04-14 NOTE — Telephone Encounter (Signed)
Pt called requesting Dr Dayton Scrape order PT for her. She had 6 treatments in past which she states were helpful, but since that time she states she forgot what to do and so has not been exercising. Pt is requesting "at least one PT session to refresh" her memory. She states she discussed this w/Dr Dayton Scrape at her last FU. Informed pt Dr Dayton Scrape is out of office, will return 04/18/12 and will route her request to him. Advised pt to FU w/nurse if she does not hear from lymphedema clinic by next Thursday. Pt verbalized agreement, understanding.

## 2012-04-17 NOTE — Telephone Encounter (Signed)
I spoke with the patient and she'll contact her physical therapist to see if she needs a new order. Since I am not actively following her, and if she needs an order for more physical therapy, this should be prescribed by Dr. Carolynne Edouard.

## 2012-04-18 ENCOUNTER — Ambulatory Visit (HOSPITAL_BASED_OUTPATIENT_CLINIC_OR_DEPARTMENT_OTHER): Payer: 59

## 2012-04-18 ENCOUNTER — Other Ambulatory Visit (HOSPITAL_BASED_OUTPATIENT_CLINIC_OR_DEPARTMENT_OTHER): Payer: 59 | Admitting: Lab

## 2012-04-18 VITALS — BP 148/74 | HR 81 | Temp 98.1°F

## 2012-04-18 DIAGNOSIS — C50419 Malignant neoplasm of upper-outer quadrant of unspecified female breast: Secondary | ICD-10-CM

## 2012-04-18 DIAGNOSIS — C50919 Malignant neoplasm of unspecified site of unspecified female breast: Secondary | ICD-10-CM

## 2012-04-18 DIAGNOSIS — Z5112 Encounter for antineoplastic immunotherapy: Secondary | ICD-10-CM

## 2012-04-18 LAB — CBC WITH DIFFERENTIAL/PLATELET
BASO%: 0.4 % (ref 0.0–2.0)
Basophils Absolute: 0 10*3/uL (ref 0.0–0.1)
EOS%: 4.2 % (ref 0.0–7.0)
Eosinophils Absolute: 0.2 10*3/uL (ref 0.0–0.5)
HCT: 37.5 % (ref 34.8–46.6)
HGB: 12.4 g/dL (ref 11.6–15.9)
LYMPH%: 9.4 % — ABNORMAL LOW (ref 14.0–49.7)
MCH: 30.8 pg (ref 25.1–34.0)
MCHC: 33.1 g/dL (ref 31.5–36.0)
MCV: 93.1 fL (ref 79.5–101.0)
MONO#: 0.3 10*3/uL (ref 0.1–0.9)
MONO%: 6 % (ref 0.0–14.0)
NEUT#: 4.2 10*3/uL (ref 1.5–6.5)
NEUT%: 80 % — ABNORMAL HIGH (ref 38.4–76.8)
Platelets: 129 10*3/uL — ABNORMAL LOW (ref 145–400)
RBC: 4.03 10*6/uL (ref 3.70–5.45)
RDW: 14.6 % — ABNORMAL HIGH (ref 11.2–14.5)
WBC: 5.2 10*3/uL (ref 3.9–10.3)
lymph#: 0.5 10*3/uL — ABNORMAL LOW (ref 0.9–3.3)
nRBC: 0 % (ref 0–0)

## 2012-04-18 LAB — COMPREHENSIVE METABOLIC PANEL
ALT: 40 U/L — ABNORMAL HIGH (ref 0–35)
AST: 35 U/L (ref 0–37)
Albumin: 3.9 g/dL (ref 3.5–5.2)
Alkaline Phosphatase: 70 U/L (ref 39–117)
BUN: 8 mg/dL (ref 6–23)
CO2: 28 mEq/L (ref 19–32)
Calcium: 8.6 mg/dL (ref 8.4–10.5)
Chloride: 103 mEq/L (ref 96–112)
Creatinine, Ser: 0.7 mg/dL (ref 0.50–1.10)
Glucose, Bld: 161 mg/dL — ABNORMAL HIGH (ref 70–99)
Potassium: 4 mEq/L (ref 3.5–5.3)
Sodium: 140 mEq/L (ref 135–145)
Total Bilirubin: 0.7 mg/dL (ref 0.3–1.2)
Total Protein: 6.7 g/dL (ref 6.0–8.3)

## 2012-04-18 LAB — CANCER ANTIGEN 27.29: CA 27.29: 30 U/mL (ref 0–39)

## 2012-04-18 MED ORDER — SODIUM CHLORIDE 0.9 % IV SOLN
Freq: Once | INTRAVENOUS | Status: AC
  Administered 2012-04-18: 11:00:00 via INTRAVENOUS

## 2012-04-18 MED ORDER — ACETAMINOPHEN 325 MG PO TABS
650.0000 mg | ORAL_TABLET | Freq: Once | ORAL | Status: AC
  Administered 2012-04-18: 650 mg via ORAL

## 2012-04-18 MED ORDER — HEPARIN SOD (PORK) LOCK FLUSH 100 UNIT/ML IV SOLN
500.0000 [IU] | Freq: Once | INTRAVENOUS | Status: AC | PRN
  Administered 2012-04-18: 500 [IU]
  Filled 2012-04-18: qty 5

## 2012-04-18 MED ORDER — SODIUM CHLORIDE 0.9 % IJ SOLN
10.0000 mL | INTRAMUSCULAR | Status: DC | PRN
  Administered 2012-04-18: 10 mL
  Filled 2012-04-18: qty 10

## 2012-04-18 MED ORDER — TRASTUZUMAB CHEMO INJECTION 440 MG
6.0000 mg/kg | Freq: Once | INTRAVENOUS | Status: AC
  Administered 2012-04-18: 693 mg via INTRAVENOUS
  Filled 2012-04-18: qty 33

## 2012-04-18 MED ORDER — DIPHENHYDRAMINE HCL 25 MG PO CAPS
25.0000 mg | ORAL_CAPSULE | Freq: Once | ORAL | Status: AC
  Administered 2012-04-18: 25 mg via ORAL

## 2012-04-18 NOTE — Patient Instructions (Signed)
[facility] Discharge Instructions for Patients Receiving Chemotherapy  Today you received the following chemotherapy agents Herceptin To help prevent nausea and vomiting after your treatment, we encourage you to take your nausea medication as prescribed.  If you develop nausea and vomiting that is not controlled by your nausea medication, call the clinic. If it is after clinic hours your family physician or the after hours number for the clinic or go to the Emergency Department.   BELOW ARE SYMPTOMS THAT SHOULD BE REPORTED IMMEDIATELY:  *FEVER GREATER THAN 100.5 F  *CHILLS WITH OR WITHOUT FEVER  NAUSEA AND VOMITING THAT IS NOT CONTROLLED WITH YOUR NAUSEA MEDICATION  *UNUSUAL SHORTNESS OF BREATH  *UNUSUAL BRUISING OR BLEEDING  TENDERNESS IN MOUTH AND THROAT WITH OR WITHOUT PRESENCE OF ULCERS  *URINARY PROBLEMS  *BOWEL PROBLEMS  UNUSUAL RASH Items with * indicate a potential emergency and should be followed up as soon as possible.  One of the nurses will contact you 24 hours after your treatment. Please let the nurse know about any problems that you may have experienced. Feel free to call the clinic you have any questions or concerns. The clinic phone number is [phone].   I have been informed and understand all the instructions given to me. I know to contact the clinic, my physician, or go to the Emergency Department if any problems should occur. I do not have any questions at this time, but understand that I may call the clinic during office hours   should I have any questions or need assistance in obtaining follow up care.    __________________________________________  _____________  __________ Signature of Patient or Authorized Representative            Date                   Time    __________________________________________ Nurse's Signature    

## 2012-04-18 NOTE — Progress Notes (Signed)
Traniyah states that she has a red rash on her chest and lower back x 1 week.  She wonders if it could be related to Arimidex which she has been on for approx 1 month?  States the rash is itchy, has been putting cortisone cream on rash.  She has multiple small red bumps on her chest and large red rash on left flank.  She has also been taking Claritin po PRN.  She denies using any new lotions, perfumes, detergents, ect or having any contact with poisonous plant life such as poison ivy, sumac.  Left message for Dr. Darrall Dears RN to make Dr. Darnelle Catalan aware for further instructions-dhp, rn

## 2012-04-22 ENCOUNTER — Telehealth: Payer: Self-pay | Admitting: Emergency Medicine

## 2012-04-22 NOTE — Telephone Encounter (Signed)
Patient states rash improving on chest area and left flank. States rash is the same on her back. She continues to use hydrocortisone cream and is taking Benadryl or Claritin PRN.  Patient states she will continue the Anastrozole unless the symptoms worsen or become unbearable. Patient instructed to return on her future appointments on 9/9 and call for any concerns.

## 2012-04-28 ENCOUNTER — Telehealth (INDEPENDENT_AMBULATORY_CARE_PROVIDER_SITE_OTHER): Payer: Self-pay | Admitting: General Surgery

## 2012-04-28 ENCOUNTER — Other Ambulatory Visit (INDEPENDENT_AMBULATORY_CARE_PROVIDER_SITE_OTHER): Payer: Self-pay | Admitting: General Surgery

## 2012-04-28 DIAGNOSIS — C50919 Malignant neoplasm of unspecified site of unspecified female breast: Secondary | ICD-10-CM

## 2012-04-28 NOTE — Telephone Encounter (Signed)
Message copied by Littie Deeds on Thu Apr 28, 2012  2:23 PM ------      Message from: [place], [place]      Created: Wed Apr 27, 2012  3:14 PM      Regarding: Dr Carolynne Edouard      Contact: [phone]       Pt need lymphoedema therapy sessions and want Dr Carolynne Edouard to have it sched for her. Please call pt 1st at [phone] home or  [phone] cell concerning this matter.            Thanks

## 2012-04-28 NOTE — Telephone Encounter (Signed)
Spoke with pt an informed her that I had sent over the referral to physical therapy and they should facilitate her being seen by the lymphedema clinic.

## 2012-05-09 ENCOUNTER — Ambulatory Visit (HOSPITAL_BASED_OUTPATIENT_CLINIC_OR_DEPARTMENT_OTHER): Payer: 59

## 2012-05-09 ENCOUNTER — Other Ambulatory Visit (HOSPITAL_BASED_OUTPATIENT_CLINIC_OR_DEPARTMENT_OTHER): Payer: 59 | Admitting: Lab

## 2012-05-09 ENCOUNTER — Ambulatory Visit (HOSPITAL_BASED_OUTPATIENT_CLINIC_OR_DEPARTMENT_OTHER): Payer: 59 | Admitting: Physician Assistant

## 2012-05-09 ENCOUNTER — Encounter: Payer: Self-pay | Admitting: Physician Assistant

## 2012-05-09 ENCOUNTER — Telehealth: Payer: Self-pay | Admitting: *Deleted

## 2012-05-09 VITALS — BP 121/73 | HR 73 | Temp 97.8°F | Resp 20 | Ht 68.0 in | Wt 245.4 lb

## 2012-05-09 DIAGNOSIS — C50419 Malignant neoplasm of upper-outer quadrant of unspecified female breast: Secondary | ICD-10-CM

## 2012-05-09 DIAGNOSIS — L989 Disorder of the skin and subcutaneous tissue, unspecified: Secondary | ICD-10-CM

## 2012-05-09 DIAGNOSIS — Z5112 Encounter for antineoplastic immunotherapy: Secondary | ICD-10-CM

## 2012-05-09 DIAGNOSIS — C50919 Malignant neoplasm of unspecified site of unspecified female breast: Secondary | ICD-10-CM

## 2012-05-09 DIAGNOSIS — Z170 Estrogen receptor positive status [ER+]: Secondary | ICD-10-CM

## 2012-05-09 LAB — COMPREHENSIVE METABOLIC PANEL (CC13)
ALT: 37 U/L (ref 0–55)
AST: 34 U/L (ref 5–34)
Albumin: 3.7 g/dL (ref 3.5–5.0)
Alkaline Phosphatase: 77 U/L (ref 40–150)
BUN: 12 mg/dL (ref 7.0–26.0)
CO2: 25 mEq/L (ref 22–29)
Calcium: 8.7 mg/dL (ref 8.4–10.4)
Chloride: 102 mEq/L (ref 98–107)
Creatinine: 0.7 mg/dL (ref 0.6–1.1)
Glucose: 89 mg/dl (ref 70–99)
Potassium: 4.3 mEq/L (ref 3.5–5.1)
Sodium: 137 mEq/L (ref 136–145)
Total Bilirubin: 1.1 mg/dL (ref 0.20–1.20)
Total Protein: 6.9 g/dL (ref 6.4–8.3)

## 2012-05-09 LAB — CBC WITH DIFFERENTIAL/PLATELET
BASO%: 0.4 % (ref 0.0–2.0)
Basophils Absolute: 0 10*3/uL (ref 0.0–0.1)
EOS%: 3.1 % (ref 0.0–7.0)
Eosinophils Absolute: 0.2 10*3/uL (ref 0.0–0.5)
HCT: 37.1 % (ref 34.8–46.6)
HGB: 12.3 g/dL (ref 11.6–15.9)
LYMPH%: 12.2 % — ABNORMAL LOW (ref 14.0–49.7)
MCH: 30.8 pg (ref 25.1–34.0)
MCHC: 33.2 g/dL (ref 31.5–36.0)
MCV: 92.8 fL (ref 79.5–101.0)
MONO#: 0.5 10*3/uL (ref 0.1–0.9)
MONO%: 9 % (ref 0.0–14.0)
NEUT#: 3.8 10*3/uL (ref 1.5–6.5)
NEUT%: 75.3 % (ref 38.4–76.8)
Platelets: 141 10*3/uL — ABNORMAL LOW (ref 145–400)
RBC: 4 10*6/uL (ref 3.70–5.45)
RDW: 14.8 % — ABNORMAL HIGH (ref 11.2–14.5)
WBC: 5.1 10*3/uL (ref 3.9–10.3)
lymph#: 0.6 10*3/uL — ABNORMAL LOW (ref 0.9–3.3)
nRBC: 0 % (ref 0–0)

## 2012-05-09 MED ORDER — ACETAMINOPHEN 325 MG PO TABS
650.0000 mg | ORAL_TABLET | Freq: Once | ORAL | Status: AC
  Administered 2012-05-09: 650 mg via ORAL

## 2012-05-09 MED ORDER — NYSTATIN 100000 UNIT/GM EX POWD: Freq: Two times a day (BID) | CUTANEOUS | Status: AC

## 2012-05-09 MED ORDER — SODIUM CHLORIDE 0.9 % IJ SOLN
10.0000 mL | INTRAMUSCULAR | Status: DC | PRN
  Administered 2012-05-09: 10 mL
  Filled 2012-05-09: qty 10

## 2012-05-09 MED ORDER — DIPHENHYDRAMINE HCL 25 MG PO CAPS
25.0000 mg | ORAL_CAPSULE | Freq: Once | ORAL | Status: AC
  Administered 2012-05-09: 25 mg via ORAL

## 2012-05-09 MED ORDER — SODIUM CHLORIDE 0.9 % IV SOLN
Freq: Once | INTRAVENOUS | Status: AC
  Administered 2012-05-09: 12:00:00 via INTRAVENOUS

## 2012-05-09 MED ORDER — TRASTUZUMAB CHEMO INJECTION 440 MG
6.0000 mg/kg | Freq: Once | INTRAVENOUS | Status: AC
  Administered 2012-05-09: 693 mg via INTRAVENOUS
  Filled 2012-05-09: qty 33

## 2012-05-09 MED ORDER — HEPARIN SOD (PORK) LOCK FLUSH 100 UNIT/ML IV SOLN
500.0000 [IU] | Freq: Once | INTRAVENOUS | Status: AC | PRN
  Administered 2012-05-09: 500 [IU]
  Filled 2012-05-09: qty 5

## 2012-05-09 NOTE — Progress Notes (Signed)
ID: [name]   DOB: [birth year]  MR#: [id number]  HYQ#:[id number]  HISTORY OF PRESENT ILLNESS: Ramla noted a mass in her left breast and brought it to her physician's attention in [place], where she was residing. She was set up for mammography 06/24/2011 at [facility] in [place], [place]. This showed a suspicious abnormality in the left breast measuring approximately 10 mm. Physical examination showed a 1 cm firm superficial mass in the axillary region of the breast and ultrasound showed this to be 11 mm and spiculated.  Biopsy was performed June 25, 2011, and showed ([id number] at the [facility]) and invasive ductal carcinoma, grade 3, estrogen receptor 95% positive, progesterone receptor 40% positive, with an MIB-1 of 29%. The tumor is HER 2 positive by FISH with a ratio of 2.5. There is tumor heterogeneity noted.  With this information the case was presented Jul 08, 2011 at the multidisciplinary  breast cancer clinic. She proceeded to definitive left lumpectomy and sentinel lymph node sampling 08/03/2011. This confirmed a 1.6 cm invasive ductal carcinoma, grade 3. Margins were ample. The two sentinel lymph nodes were clear.  Her subsequent history is as detailed below.  INTERVAL HISTORY: Floreen returns today for followup of her breast carcinoma. She continues to receive trastuzumab every 3 weeks, and is due for her next dose today. She continues on anastrozole as well, but seems to have developed a rash on her chest and lower back since initiating the drug. She has not discontinued the anastrozole. She's tried using cortisone creams, Benadryl, and a prescription strength steroidal cream she had on hand (used in the past for eczema), but none of these agents seem to have helped. The rash is mildly itchy, and "aggravating".   Interval history is remarkable for Mieko continuing to train her new puppy. She is staying very busy. She is scheduled for a routine physical with  Dr. Lenord Fellers later this month.   REVIEW OF SYSTEMS: Aashritha denies any recent fevers or chills. No significant hot flashes. She does have some vaginal dryness for which she is trying Replens. She denies any vaginal bleeding, and has had no abnormal bleeding elsewhere.   her platelets are stable. She's had no additional nausea, emesis, or diarrhea. No abdominal cramping or pain. No abnormal headaches. She has some occasional pain in her left hip, but no increased arthralgias since beginning the anastrozole. No increased pedal edema. No orthopnea, cough or shortness of breath. No chest pain or palpitations.    Otherwise a detailed review of systems is noncontributory.    PAST MEDICAL HISTORY: Past Medical History  Diagnosis Date  . Thrombocytopenia, primary   . Asthma   . Arthritis   . Complication of anesthesia     difficulty waking up/dizzy/lightheaded  . Environmental allergies   . Shortness of breath   . Hypothyroidism   . Blood transfusion   . Anxiety   . Dysrhythmia     irregular heartbeat, takes digoxin  . Chronic edema   . Breast cancer 2012    left breast  . Hx of radiation therapy 12/15/11 - 01/29/12    left breast  . H/O varicella   . History of measles, mumps, or rubella   . TTP (thrombotic thrombocytopenic purpura)   . Hepatitis C   . H/O varicose veins    PAST SURGICAL HISTORY: Past Surgical History  Procedure Date  . Elbow pins     left  . Right hip replacement   . Left foot reconstruction   .  Ortho left foot surg    . Joint replacement     bilat hip  . Portacath placement 08/03/2011    Procedure: INSERTION PORT-A-CATH;  Surgeon: Robyne Askew, MD;  Location: [facility] OR;  Service: General;  Laterality: Right;  . Breast lumpectomy 08/03/11    left lumpectomy and slnbx,T1cN0,triple pos    FAMILY HISTORY Family History  Problem Relation Age of Onset  . Cancer Maternal Grandfather     brain  . Alcohol abuse Maternal Grandfather   . Heart disease Neg Hx   .  Hyperlipidemia Neg Hx   . Hypertension Neg Hx   Patient's father died at the age of 90 from emphysema in the setting of tobacco abuse patient's mother is alive at 21 the patient has one brother and one sister there is no history of breast or ovarian cancer in the immediate family    Gynecologic history: She is GX P0 menarche age 58 most recent period July of 2008 she never took hormone replacement   Social History: the patient is a homemaker; she used to work in an office previously. Her husband [name] is a Magazine features editor. They moved here from [facility] Oct 2012.    ADVANCED DIRECTIVES:  HEALTH MAINTENANCE: History  Substance Use Topics  . Smoking status: Never Smoker   . Smokeless tobacco: Never Used  . Alcohol Use: 0.6 oz/week    1 Glasses of wine per week     Colonoscopy:  PAP: Oct 2012  Bone density: Nov 2011/ nl  Lipid panel:  Allergies  Allergen Reactions  . Aspirin     Hx of TTP  . Nsaids     Hx TTP    Current Outpatient Prescriptions  Medication Sig Dispense Refill  . acetaminophen (TYLENOL) 500 MG tablet Take 500 mg by mouth every 6 (six) hours as needed. For pain       . albuterol (PROVENTIL HFA;VENTOLIN HFA) 108 (90 BASE) MCG/ACT inhaler Inhale 2 puffs into the lungs every 6 (six) hours as needed. For shortness of breath       . anastrozole (ARIMIDEX) 1 MG tablet Take 1 mg by mouth daily.      . cholecalciferol (VITAMIN D) 1000 UNITS tablet Take 2,000 Units by mouth daily.        . citalopram (CELEXA) 20 MG tablet TAKE 1 TABLET BY MOUTH EVERY DAILY  90 tablet  1  . clobetasol ointment (TEMOVATE) 0.05 % Apply 1 application topically daily as needed. For psoraisis       . digoxin (LANOXIN) 0.25 MG tablet TAKE 1 TABLET BY MOUTH EVERY DAY  90 tablet  0  . fluticasone (FLONASE) 50 MCG/ACT nasal spray Place 2 sprays into the nose daily.  1 g  0  . glucosamine-chondroitin 500-400 MG tablet Take 1 tablet by mouth daily.       . Halcinonide (HALOG) 0.1 % CREA Apply 1 application  topically daily as needed. For eczema      . levothyroxine (SYNTHROID, LEVOTHROID) 50 MCG tablet TAKE 1 TABLET BY MOUTH EVERY DAY  30 tablet  6  . lidocaine-prilocaine (EMLA) cream Apply topically as needed.        . loperamide (IMODIUM A-D) 2 MG tablet Take 2 mg by mouth 4 (four) times daily as needed. For diarrhea       . Loratadine (CLARITIN) 10 MG CAPS Take by mouth as needed.      . metroNIDAZOLE (METROCREAM) 0.75 % cream Apply topically 2 (two) times daily.  45 g  2  . montelukast (SINGULAIR) 10 MG tablet Take 10 mg by mouth at bedtime.        . non-metallic deodorant Thornton Papas) MISC Apply 1 application topically daily as needed.      . nystatin (MYCOSTATIN) powder Apply topically 2 (two) times daily.  15 g  1  . ondansetron (ZOFRAN) 8 MG tablet Take 1 tablet (8 mg total) by mouth every 12 (twelve) hours as needed for nausea.  30 tablet  2  . OVER THE COUNTER MEDICATION Take 1 tablet by mouth daily as needed. For gas pain      . pimecrolimus (ELIDEL) 1 % cream Apply 1 application topically 2 (two) times daily as needed. For exczema       . prochlorperazine (COMPAZINE) 10 MG tablet Take 1 tablet (10 mg total) by mouth every 6 (six) hours as needed.  30 tablet  2  . PULMICORT FLEXHALER 180 MCG/ACT inhaler INHALE 2 PUFFS BY MOUTH EVERY MORNING  3 Inhaler  0  . tobramycin-dexamethasone (TOBRADEX) ophthalmic solution Place 1 drop into both eyes 2 (two) times daily as needed.        . vitamin B-12 (CYANOCOBALAMIN) 100 MCG tablet Take 2,000 mcg by mouth daily.       . Wound Cleansers (RADIAPLEX EX) Apply topically.       No current facility-administered medications for this visit.   Facility-Administered Medications Ordered in Other Visits  Medication Dose Route Frequency Provider Last Rate Last Dose  . 0.9 %  sodium chloride infusion   Intravenous Once Lowella Dell, MD      . acetaminophen (TYLENOL) tablet 650 mg  650 mg Oral Once Lowella Dell, MD   650 mg at 05/09/12 1150  .  diphenhydrAMINE (BENADRYL) capsule 25 mg  25 mg Oral Once Lowella Dell, MD   25 mg at 05/09/12 1150  . heparin lock flush 100 unit/mL  500 Units Intracatheter Once PRN Lowella Dell, MD   500 Units at 05/09/12 1239  . sodium chloride 0.9 % injection 10 mL  10 mL Intracatheter PRN Lowella Dell, MD   10 mL at 05/09/12 1239  . trastuzumab (HERCEPTIN) 693 mg in sodium chloride 0.9 % 250 mL chemo infusion  6 mg/kg (Treatment Plan Actual) Intravenous Once Lowella Dell, MD   693 mg at 05/09/12 1205    OBJECTIVE: Middle-aged white woman in no acute distress Filed Vitals:   05/09/12 1028  BP: 121/73  Pulse: 73  Temp: 97.8 F (36.6 C)  Resp: 20     Body mass index is 37.31 kg/(m^2).    ECOG FS: 1  Physical Exam: HEENT:  Sclerae anicteric.  Oropharynx clear.  Nodes:  No cervical, supraclavicular, or axillary lymphadenopathy palpated.  Breast Exam:  Right breast is benign no masses, skin changes, or nipple inversion.  There is no erythema noted in the right inframammary fold, consistent with a fungal rash. Left breast is status post lumpectomy and radiation. Mild hyperpigmentation status post radiation therapy.  No additional skin changes noted. No suspicious nodularity. No evidence of local recurrence. Lungs:  Clear to auscultation bilaterally.  No crackles, rhonchi, or wheezes.   Heart:  Regular rate and rhythm.   Abdomen:  Soft, obese, nontender.  Positive bowel sounds.  No organomegaly or masses palpated.   Musculoskeletal:  No focal spinal tenderness to palpation.  Extremities:  Benign.  No peripheral edema or cyanosis.   Neuro:  Nonfocal. Alert and oriented x3.  Skin:  There is a dry appearing, confluent erythematous rash across the upper chest and on the lower back, primarily on the left side. There are no vesicles or pustules noted.    LAB RESULTS: Lab Results  Component Value Date   WBC 5.1 05/09/2012   NEUTROABS 3.8 05/09/2012   HGB 12.3 05/09/2012   HCT 37.1 05/09/2012     MCV 92.8 05/09/2012   PLT 141* 05/09/2012      Chemistry      Component Value Date/Time   NA 137 05/09/2012 1006   NA 140 04/18/2012 1005   K 4.3 05/09/2012 1006   K 4.0 04/18/2012 1005   CL 102 05/09/2012 1006   CL 103 04/18/2012 1005   CO2 25 05/09/2012 1006   CO2 28 04/18/2012 1005   BUN 12.0 05/09/2012 1006   BUN 8 04/18/2012 1005   CREATININE 0.7 05/09/2012 1006   CREATININE 0.70 04/18/2012 1005      Component Value Date/Time   CALCIUM 8.7 05/09/2012 1006   CALCIUM 8.6 04/18/2012 1005   ALKPHOS 77 05/09/2012 1006   ALKPHOS 70 04/18/2012 1005   AST 34 05/09/2012 1006   AST 35 04/18/2012 1005   ALT 37 05/09/2012 1006   ALT 40* 04/18/2012 1005   BILITOT 1.10 05/09/2012 1006   BILITOT 0.7 04/18/2012 1005       Lab Results  Component Value Date   LABCA2 30 04/18/2012     STUDIES: 2-D echocardiogram on 02/25/2012 showed an ejection fraction of 55 - 60%. Next echo is due in late September 2013.   ASSESSMENT: 56 year old [place] woman originally from [place]   1. Status post left lumpectomy and sentinel lymph node sampling 08/03/2011 for a T1C N0 (Stage I) invasive ductal carcinoma, high-grade, triple positive.   2. treated in the adjuvant setting with 4 doses of docetaxel and cyclophosphamide completed 11/16/2011 given along with trastuzumab    3. Trastuzumab to be continued for total of one year (to mid-January 2014)  4. Status post radiation therapy, completed late May of 2013.  5. began on anastrozole in early June 2013, 1 mg daily.   7. Comorbidities include a history of remote thrombotic thrombocytopenic purpura and hepatitis C.    PLAN: Caitriona receive her next dose of trastuzumab today, and again in 3 weeks. I will try to see her in 3 weeks for brief followup, and between now and then, she will hold the anastrozole. I am doubtful that the anastrozole is contributing to her rash, but we would like to confirm this. If, when she returns in 3 weeks, the rash remains, we will resume  anastrozole and refer her for dermatology evaluation. If, however, the rash has resolved, we will consider an additional aromatase inhibitor, possibly letrozole.   I also prescribed nystatin powder to apply topically under the right breast for what appears to be a fungal skin infection.  Orianna will be due for her next echocardiogram and followup with Dr. Gala Romney in late September and that was ordered today.  Kaisa voices understanding and agreement with our plan, and will call with any changes or problems.   Anjuli Gemmill    05/09/2012

## 2012-05-09 NOTE — Patient Instructions (Signed)
[facility] Discharge Instructions for Patients Receiving Chemotherapy  Today you received the following chemotherapy agents Herceptin.  To help prevent nausea and vomiting after your treatment, we encourage you to take your nausea medication.   If you develop nausea and vomiting that is not controlled by your nausea medication, call the clinic. If it is after clinic hours your family physician or the after hours number for the clinic or go to the Emergency Department.   BELOW ARE SYMPTOMS THAT SHOULD BE REPORTED IMMEDIATELY:  *FEVER GREATER THAN 100.5 F  *CHILLS WITH OR WITHOUT FEVER  NAUSEA AND VOMITING THAT IS NOT CONTROLLED WITH YOUR NAUSEA MEDICATION  *UNUSUAL SHORTNESS OF BREATH  *UNUSUAL BRUISING OR BLEEDING  TENDERNESS IN MOUTH AND THROAT WITH OR WITHOUT PRESENCE OF ULCERS  *URINARY PROBLEMS  *BOWEL PROBLEMS  UNUSUAL RASH Items with * indicate a potential emergency and should be followed up as soon as possible.  One of the nurses will contact you 24 hours after your treatment. Please let the nurse know about any problems that you may have experienced. Feel free to call the clinic you have any questions or concerns. The clinic phone number is [phone].   I have been informed and understand all the instructions given to me. I know to contact the clinic, my physician, or go to the Emergency Department if any problems should occur. I do not have any questions at this time, but understand that I may call the clinic during office hours   should I have any questions or need assistance in obtaining follow up care.    __________________________________________  _____________  __________ Signature of Patient or Authorized Representative            Date                   Time    __________________________________________ Nurse's Signature    

## 2012-05-09 NOTE — Telephone Encounter (Signed)
Per staff message and POF I have scheduled appts.  JMW  

## 2012-05-10 ENCOUNTER — Ambulatory Visit: Payer: 59 | Attending: General Surgery | Admitting: Physical Therapy

## 2012-05-10 ENCOUNTER — Telehealth: Payer: Self-pay | Admitting: *Deleted

## 2012-05-10 DIAGNOSIS — IMO0001 Reserved for inherently not codable concepts without codable children: Secondary | ICD-10-CM | POA: Insufficient documentation

## 2012-05-10 DIAGNOSIS — I890 Lymphedema, not elsewhere classified: Secondary | ICD-10-CM | POA: Insufficient documentation

## 2012-05-10 NOTE — Telephone Encounter (Signed)
Echo and Bensimhon in late Sept; lab, AB and Herceptin 9/30; lab and Herceptin 10/21 and 11/11; lab, GM and Herceptin 12/2  Patient aware of all at the appointments

## 2012-05-11 ENCOUNTER — Ambulatory Visit: Payer: 59

## 2012-05-23 ENCOUNTER — Ambulatory Visit: Payer: 59

## 2012-05-25 ENCOUNTER — Ambulatory Visit: Payer: 59 | Admitting: Physical Therapy

## 2012-05-27 ENCOUNTER — Other Ambulatory Visit: Payer: Self-pay | Admitting: *Deleted

## 2012-05-27 ENCOUNTER — Telehealth: Payer: Self-pay | Admitting: *Deleted

## 2012-05-27 NOTE — Telephone Encounter (Signed)
Pt called due to noted she is not scheduled for appt with AB on 9/30 pre treatment.  " at my last visit I had a rash that she felt was related to the anastrazole which she stopped "  " she mentioned seeing me again at my next treatment to see how I was and possibly change to another medication ".  appt request noted in onc tx

## 2012-05-30 ENCOUNTER — Other Ambulatory Visit: Payer: 59 | Admitting: Internal Medicine

## 2012-05-30 ENCOUNTER — Telehealth: Payer: Self-pay | Admitting: *Deleted

## 2012-05-30 ENCOUNTER — Other Ambulatory Visit: Payer: 59 | Admitting: Lab

## 2012-05-30 ENCOUNTER — Ambulatory Visit: Payer: 59 | Admitting: Physician Assistant

## 2012-05-30 ENCOUNTER — Other Ambulatory Visit: Payer: Self-pay | Admitting: *Deleted

## 2012-05-30 ENCOUNTER — Encounter: Payer: Self-pay | Admitting: *Deleted

## 2012-05-30 ENCOUNTER — Ambulatory Visit (HOSPITAL_BASED_OUTPATIENT_CLINIC_OR_DEPARTMENT_OTHER): Payer: 59

## 2012-05-30 VITALS — BP 120/77 | HR 69 | Temp 98.2°F | Resp 20

## 2012-05-30 DIAGNOSIS — C50919 Malignant neoplasm of unspecified site of unspecified female breast: Secondary | ICD-10-CM

## 2012-05-30 DIAGNOSIS — E039 Hypothyroidism, unspecified: Secondary | ICD-10-CM

## 2012-05-30 DIAGNOSIS — Z0000 Encounter for general adult medical examination without abnormal findings: Secondary | ICD-10-CM

## 2012-05-30 DIAGNOSIS — Z5112 Encounter for antineoplastic immunotherapy: Secondary | ICD-10-CM

## 2012-05-30 DIAGNOSIS — C50419 Malignant neoplasm of upper-outer quadrant of unspecified female breast: Secondary | ICD-10-CM

## 2012-05-30 LAB — CBC WITH DIFFERENTIAL/PLATELET
Basophils Absolute: 0 10*3/uL (ref 0.0–0.1)
Basophils Relative: 1 % (ref 0–1)
Eosinophils Absolute: 0.3 10*3/uL (ref 0.0–0.7)
Eosinophils Relative: 5 % (ref 0–5)
HCT: 36.7 % (ref 36.0–46.0)
Hemoglobin: 12.6 g/dL (ref 12.0–15.0)
Lymphocytes Relative: 11 % — ABNORMAL LOW (ref 12–46)
Lymphs Abs: 0.5 10*3/uL — ABNORMAL LOW (ref 0.7–4.0)
MCH: 31.1 pg (ref 26.0–34.0)
MCHC: 34.3 g/dL (ref 30.0–36.0)
MCV: 90.6 fL (ref 78.0–100.0)
Monocytes Absolute: 0.5 10*3/uL (ref 0.1–1.0)
Monocytes Relative: 11 % (ref 3–12)
Neutro Abs: 3.6 10*3/uL (ref 1.7–7.7)
Neutrophils Relative %: 72 % (ref 43–77)
Platelets: 169 10*3/uL (ref 150–400)
RBC: 4.05 MIL/uL (ref 3.87–5.11)
RDW: 14.5 % (ref 11.5–15.5)
WBC: 4.9 10*3/uL (ref 4.0–10.5)

## 2012-05-30 LAB — LIPID PANEL
Cholesterol: 172 mg/dL (ref 0–200)
HDL: 46 mg/dL (ref >39)
LDL Cholesterol: 97 mg/dL (ref 0–99)
Total CHOL/HDL Ratio: 3.7 Ratio
Triglycerides: 145 mg/dL (ref <150)
VLDL: 29 mg/dL (ref 0–40)

## 2012-05-30 LAB — COMPREHENSIVE METABOLIC PANEL
ALT: 32 U/L (ref 0–35)
AST: 31 U/L (ref 0–37)
Albumin: 4.1 g/dL (ref 3.5–5.2)
Alkaline Phosphatase: 70 U/L (ref 39–117)
BUN: 9 mg/dL (ref 6–23)
CO2: 26 mEq/L (ref 19–32)
Calcium: 8.7 mg/dL (ref 8.4–10.5)
Chloride: 102 mEq/L (ref 96–112)
Creat: 0.72 mg/dL (ref 0.50–1.10)
Glucose, Bld: 102 mg/dL — ABNORMAL HIGH (ref 70–99)
Potassium: 4.2 mEq/L (ref 3.5–5.3)
Sodium: 140 mEq/L (ref 135–145)
Total Bilirubin: 0.8 mg/dL (ref 0.3–1.2)
Total Protein: 7.1 g/dL (ref 6.0–8.3)

## 2012-05-30 LAB — TSH: TSH: 3.639 u[IU]/mL (ref 0.350–4.500)

## 2012-05-30 MED ORDER — SODIUM CHLORIDE 0.9 % IV SOLN
Freq: Once | INTRAVENOUS | Status: AC
  Administered 2012-05-30: 10:00:00 via INTRAVENOUS

## 2012-05-30 MED ORDER — SODIUM CHLORIDE 0.9 % IJ SOLN
10.0000 mL | INTRAMUSCULAR | Status: DC | PRN
  Administered 2012-05-30: 10 mL
  Filled 2012-05-30: qty 10

## 2012-05-30 MED ORDER — HEPARIN SOD (PORK) LOCK FLUSH 100 UNIT/ML IV SOLN
500.0000 [IU] | Freq: Once | INTRAVENOUS | Status: AC | PRN
  Administered 2012-05-30: 500 [IU]
  Filled 2012-05-30: qty 5

## 2012-05-30 MED ORDER — DIPHENHYDRAMINE HCL 25 MG PO CAPS
25.0000 mg | ORAL_CAPSULE | Freq: Once | ORAL | Status: AC
  Administered 2012-05-30: 25 mg via ORAL

## 2012-05-30 MED ORDER — ACETAMINOPHEN 325 MG PO TABS
650.0000 mg | ORAL_TABLET | Freq: Once | ORAL | Status: AC
  Administered 2012-05-30: 650 mg via ORAL

## 2012-05-30 MED ORDER — TRASTUZUMAB CHEMO INJECTION 440 MG
6.0000 mg/kg | Freq: Once | INTRAVENOUS | Status: AC
  Administered 2012-05-30: 693 mg via INTRAVENOUS
  Filled 2012-05-30: qty 33

## 2012-05-30 NOTE — Patient Instructions (Addendum)
[facility] Discharge Instructions for Patients Receiving Chemotherapy  Today you received the following chemotherapy agents :  Herceptin.  To help prevent nausea and vomiting after your treatment, we encourage you to take your nausea medication as instructed by your physician, and take meds as needed for nausea.    If you develop nausea and vomiting that is not controlled by your nausea medication, call the clinic. If it is after clinic hours your family physician or the after hours number for the clinic or go to the Emergency Department.   BELOW ARE SYMPTOMS THAT SHOULD BE REPORTED IMMEDIATELY:  *FEVER GREATER THAN 100.5 F  *CHILLS WITH OR WITHOUT FEVER  NAUSEA AND VOMITING THAT IS NOT CONTROLLED WITH YOUR NAUSEA MEDICATION  *UNUSUAL SHORTNESS OF BREATH  *UNUSUAL BRUISING OR BLEEDING  TENDERNESS IN MOUTH AND THROAT WITH OR WITHOUT PRESENCE OF ULCERS  *URINARY PROBLEMS  *BOWEL PROBLEMS  UNUSUAL RASH Items with * indicate a potential emergency and should be followed up as soon as possible.  One of the nurses will contact you 24 hours after your treatment. Please let the nurse know about any problems that you may have experienced. Feel free to call the clinic you have any questions or concerns. The clinic phone number is [phone].   I have been informed and understand all the instructions given to me. I know to contact the clinic, my physician, or go to the Emergency Department if any problems should occur. I do not have any questions at this time, but understand that I may call the clinic during office hours   should I have any questions or need assistance in obtaining follow up care.    __________________________________________  _____________  __________ Signature of Patient or Authorized Representative            Date                   Time    __________________________________________ Nurse's Signature    

## 2012-05-30 NOTE — Progress Notes (Signed)
Pt was seen by this RN pre treatment with Herceptin for assessment of rash associated with arimidex. Arimidex was discontinued 3 weeks ago.  Noted per pt's verbalization " rash is much better- not itching and almost gone. Per RN inspection noted area on left mid back approximately the size of a salad plate of dry skin with no papules or rash.  Per discussion with AB/PA pt will proceed with herceptin treatment as schedued. Pt needs to be seen with next IV therapy to discussion start of another anti estrogen therapy.

## 2012-05-30 NOTE — Telephone Encounter (Signed)
Per staff message and POF I have adjusted appt for 10/21. JMW

## 2012-05-31 ENCOUNTER — Encounter: Payer: Self-pay | Admitting: Internal Medicine

## 2012-05-31 ENCOUNTER — Ambulatory Visit (INDEPENDENT_AMBULATORY_CARE_PROVIDER_SITE_OTHER): Payer: 59 | Admitting: Internal Medicine

## 2012-05-31 VITALS — BP 128/82 | HR 88 | Temp 98.4°F | Ht 67.0 in | Wt 246.0 lb

## 2012-05-31 DIAGNOSIS — Z23 Encounter for immunization: Secondary | ICD-10-CM

## 2012-05-31 DIAGNOSIS — Z8619 Personal history of other infectious and parasitic diseases: Secondary | ICD-10-CM

## 2012-05-31 DIAGNOSIS — E039 Hypothyroidism, unspecified: Secondary | ICD-10-CM

## 2012-05-31 DIAGNOSIS — Z0000 Encounter for general adult medical examination without abnormal findings: Secondary | ICD-10-CM

## 2012-05-31 DIAGNOSIS — Z853 Personal history of malignant neoplasm of breast: Secondary | ICD-10-CM

## 2012-05-31 DIAGNOSIS — J309 Allergic rhinitis, unspecified: Secondary | ICD-10-CM

## 2012-05-31 DIAGNOSIS — J45909 Unspecified asthma, uncomplicated: Secondary | ICD-10-CM

## 2012-05-31 DIAGNOSIS — Z96643 Presence of artificial hip joint, bilateral: Secondary | ICD-10-CM

## 2012-05-31 DIAGNOSIS — Z96649 Presence of unspecified artificial hip joint: Secondary | ICD-10-CM

## 2012-05-31 DIAGNOSIS — Z862 Personal history of diseases of the blood and blood-forming organs and certain disorders involving the immune mechanism: Secondary | ICD-10-CM

## 2012-05-31 LAB — VITAMIN D 25 HYDROXY (VIT D DEFICIENCY, FRACTURES): Vit D, 25-Hydroxy: 43 ng/mL (ref 30–89)

## 2012-06-01 ENCOUNTER — Ambulatory Visit: Payer: 59 | Attending: General Surgery | Admitting: Physical Therapy

## 2012-06-01 DIAGNOSIS — IMO0001 Reserved for inherently not codable concepts without codable children: Secondary | ICD-10-CM | POA: Insufficient documentation

## 2012-06-01 DIAGNOSIS — I890 Lymphedema, not elsewhere classified: Secondary | ICD-10-CM | POA: Insufficient documentation

## 2012-06-02 ENCOUNTER — Encounter (HOSPITAL_COMMUNITY): Payer: Self-pay

## 2012-06-02 ENCOUNTER — Ambulatory Visit (HOSPITAL_COMMUNITY)
Admission: RE | Admit: 2012-06-02 | Discharge: 2012-06-02 | Disposition: A | Discharge status: Home / Self Care | Payer: 59 | Source: Ambulatory Visit | Attending: Internal Medicine | Admitting: Internal Medicine

## 2012-06-02 ENCOUNTER — Ambulatory Visit (HOSPITAL_BASED_OUTPATIENT_CLINIC_OR_DEPARTMENT_OTHER)
Admission: RE | Admit: 2012-06-02 | Discharge: 2012-06-02 | Disposition: A | Discharge status: Home / Self Care | Payer: 59 | Source: Ambulatory Visit | Attending: Internal Medicine | Admitting: Internal Medicine

## 2012-06-02 VITALS — BP 118/72 | HR 65 | Ht 67.0 in | Wt 246.8 lb

## 2012-06-02 DIAGNOSIS — I517 Cardiomegaly: Secondary | ICD-10-CM

## 2012-06-02 DIAGNOSIS — I369 Nonrheumatic tricuspid valve disorder, unspecified: Secondary | ICD-10-CM | POA: Insufficient documentation

## 2012-06-02 DIAGNOSIS — C50919 Malignant neoplasm of unspecified site of unspecified female breast: Secondary | ICD-10-CM

## 2012-06-02 NOTE — Assessment & Plan Note (Addendum)
ECHO results reviewed during the office visit. EF and lateral S' stable. No evidence of cardiotoxicity. Repeat ECHO in January after Herceptin completed.   Patient seen and examined with Tonye Becket, NP. We discussed all aspects of the encounter. I agree with the assessment and plan as stated above.  I reviewed echos personally. EF and Doppler parameters stable. No HF on exam. Continue Herceptin.

## 2012-06-02 NOTE — Progress Notes (Signed)
Patient ID: Tara [name], female   DOB: 06/04/1956, 56 y.o.   MRN: [id number]  Referring Physician:  Magrinat Reason for Consultation: Herceptin monitoring HPI:  Tara [name] is a 56 y/o woman with h/o HCV, asthma, palpitations, chronic LE edema, TTP and obesity.  Recently found to have breast CA in [place], where she was residing until recently. She was set up for mammography 06/24/2011 at [facility] in [place], [place]. This showed a suspicious abnormality in the left breast measuring approximately 10 mm. Physical examination showed a 1 cm firm superficial mass in the axillary region of the breast and ultrasound showed this to be 11 mm and spiculated.  Biopsy was performed June 25, 2011, and showed ([id number] at the [facility]) and invasive ductal carcinoma, grade 3, estrogen receptor 95% positive, progesterone receptor 40% positive, with an MIB-1 of 29%. The tumor is HER 2 positive by FISH with a ratio of 2.5. There is tumor heterogeneity noted.  With this information the case was presented Jul 08, 2011 at the multidisciplinary breast cancer conference and ithe patient was evaluated that afternoon at the multidisciplinary breast cancer clinic. She proceeded to definitive left lumpectomy and sentinel lymph node sampling 08/03/2011. This confirmed a 1.6 cm invasive ductal carcinoma, grade 3. Margins were ample. The two sentinel lymph nodes were clear. The decision was made to proceed with 4 cycles of adjuvant chemotherapy consisting of docetaxel/cyclophosphamide given with trastuzumab every 3 weeks with Neulasta on day 2 for granulocyte support. Trastuzumab will be continued for total of one year.  She has now completed her adjuvant chemo. Now starting Herceptin q3weeks x 1 year +XRT. Presents for enrollment into cardio-onc clinic. Denies h/o HF or CAD. Has had palpitations in past and PCP in [place] placed her on digoxin. Also has chronic LE edema. Previously took lasix but no  longer taking. Feels she is getting more dyspneic lately. Hard to talk at times. Feels like it is her asthma. By time she gets her inhaler she feels better. No orthopnea or PND. No CP. Mild dyspnea on walking upstairs. ADLs OK. Snores if she is on her back.  Recent dig level 0.9  Echos reviewed personally in clinic  07/22/11: EF 60% Poor windows. Pseudonormal filling pattern. Unable to see s' well. ~8cm/sec 10/13/11: EF 60% Grade 1 DD. Lateral s' 9.8 cm/s 02/25/12 EF60%  lateral s' 9.6 06/01/12 EF 60% Lateral S' 9.8  She returns for follow up. Denies SOB/PND/Orthopnea. She continues on Herceptin every 3 weeks. She will complete Herceptin 07/2012.  Chronic lower extremity edema. Walks 1/2 mile per day.        Past Medical History  Diagnosis Date  . Thrombocytopenia, primary   . Asthma   . Arthritis   . Complication of anesthesia     difficulty waking up/dizzy/lightheaded  . Environmental allergies   . Shortness of breath   . Hypothyroidism   . Blood transfusion   . Anxiety   . Dysrhythmia     irregular heartbeat, takes digoxin  . Chronic edema   . Breast cancer 2012    left breast  . Hx of radiation therapy 12/15/11 - 01/29/12    left breast  . H/O varicella   . History of measles, mumps, or rubella   . TTP (thrombotic thrombocytopenic purpura)   . Hepatitis C   . H/O varicose veins          Allergies  Allergen Reactions  . Aspirin     Hx of TTP  .  Nsaids     Hx TTP    History   Social History  . Marital Status: Married    Spouse Name: N/A    Number of Children: N/A  . Years of Education: N/A   Occupational History  . Not on file.   Social History Main Topics  . Smoking status: Never Smoker   . Smokeless tobacco: Never Used  . Alcohol Use: 0.6 oz/week    1 Glasses of wine per week  . Drug Use: No  . Sexually Active: Not Currently   Other Topics Concern  . Not on file   Social History Narrative  . No narrative on file    Family History    Problem Relation Age of Onset  . Cancer Maternal Grandfather     brain  . Alcohol abuse Maternal Grandfather   . Heart disease Neg Hx   . Hyperlipidemia Neg Hx   . Hypertension Neg Hx     PHYSICAL EXAM: Filed Vitals:   06/02/12 1042  BP: 118/72  Pulse: 65    General:  Well appearing. No respiratory difficulty HEENT: normal. alopecic Neck: supple. no JVD. Carotids 2+ bilat; no bruits. No lymphadenopathy or thryomegaly appreciated. Cor: PMI nondisplaced. Regular rate & rhythm. No rubs or murmurs. +s4 Lungs: clear Abdomen: obese soft, nontender, nondistended. No hepatosplenomegaly. No bruits or masses. Good bowel sounds. Extremities: large. no cyanosis, clubbing, rash, edema Neuro: alert & oriented x 3, cranial nerves grossly intact. moves all 4 extremities w/o difficulty. Affect pleasant.   ASSESSMENT/PLAN:

## 2012-06-02 NOTE — Patient Instructions (Addendum)
Follow up in January with ECHO and Dr Gala Romney

## 2012-06-02 NOTE — Progress Notes (Signed)
*  PRELIMINARY RESULTS* Echocardiogram 2D Echocardiogram has been performed.  Jeryl Columbia 06/02/2012, 10:42 AM

## 2012-06-08 ENCOUNTER — Ambulatory Visit: Payer: 59

## 2012-06-10 ENCOUNTER — Ambulatory Visit: Payer: 59 | Admitting: Physical Therapy

## 2012-06-13 ENCOUNTER — Ambulatory Visit: Payer: 59 | Admitting: Physical Therapy

## 2012-06-13 ENCOUNTER — Other Ambulatory Visit: Payer: Self-pay | Admitting: Oncology

## 2012-06-13 NOTE — Telephone Encounter (Signed)
Refill request for Digoxin rec'd, however we started this for patient when new to the area from [place] last year in 07/2011. Unable to determine who primary MD is, Drs. Yetta Barre or Baxley, in addition to Dr Jones Broom as Cardiology, called and left patient message to please call back so we can clarify appropriate prescriber.

## 2012-06-20 ENCOUNTER — Other Ambulatory Visit: Payer: Self-pay | Admitting: Internal Medicine

## 2012-06-20 ENCOUNTER — Encounter: Payer: Self-pay | Admitting: Physician Assistant

## 2012-06-20 ENCOUNTER — Other Ambulatory Visit (HOSPITAL_BASED_OUTPATIENT_CLINIC_OR_DEPARTMENT_OTHER): Payer: 59 | Admitting: Lab

## 2012-06-20 ENCOUNTER — Ambulatory Visit (HOSPITAL_BASED_OUTPATIENT_CLINIC_OR_DEPARTMENT_OTHER): Payer: 59

## 2012-06-20 ENCOUNTER — Telehealth: Payer: Self-pay | Admitting: *Deleted

## 2012-06-20 ENCOUNTER — Ambulatory Visit (HOSPITAL_BASED_OUTPATIENT_CLINIC_OR_DEPARTMENT_OTHER): Payer: 59 | Admitting: Physician Assistant

## 2012-06-20 VITALS — BP 125/80 | HR 76 | Temp 98.4°F | Resp 20 | Ht 67.0 in | Wt 245.0 lb

## 2012-06-20 DIAGNOSIS — Z5111 Encounter for antineoplastic chemotherapy: Secondary | ICD-10-CM

## 2012-06-20 DIAGNOSIS — C50919 Malignant neoplasm of unspecified site of unspecified female breast: Secondary | ICD-10-CM

## 2012-06-20 DIAGNOSIS — C50619 Malignant neoplasm of axillary tail of unspecified female breast: Secondary | ICD-10-CM

## 2012-06-20 DIAGNOSIS — Z170 Estrogen receptor positive status [ER+]: Secondary | ICD-10-CM

## 2012-06-20 LAB — COMPREHENSIVE METABOLIC PANEL (CC13)
ALT: 43 U/L (ref 0–55)
AST: 38 U/L — ABNORMAL HIGH (ref 5–34)
Albumin: 3.8 g/dL (ref 3.5–5.0)
Alkaline Phosphatase: 80 U/L (ref 40–150)
BUN: 11 mg/dL (ref 7.0–26.0)
CO2: 22 mEq/L (ref 22–29)
Calcium: 9.1 mg/dL (ref 8.4–10.4)
Chloride: 104 mEq/L (ref 98–107)
Creatinine: 0.8 mg/dL (ref 0.6–1.1)
Glucose: 106 mg/dl — ABNORMAL HIGH (ref 70–99)
Potassium: 4.1 mEq/L (ref 3.5–5.1)
Sodium: 139 mEq/L (ref 136–145)
Total Bilirubin: 0.8 mg/dL (ref 0.20–1.20)
Total Protein: 7.1 g/dL (ref 6.4–8.3)

## 2012-06-20 LAB — CBC WITH DIFFERENTIAL/PLATELET
BASO%: 0.6 % (ref 0.0–2.0)
Basophils Absolute: 0 10*3/uL (ref 0.0–0.1)
EOS%: 4.5 % (ref 0.0–7.0)
Eosinophils Absolute: 0.2 10*3/uL (ref 0.0–0.5)
HCT: 38.6 % (ref 34.8–46.6)
HGB: 13 g/dL (ref 11.6–15.9)
LYMPH%: 12.6 % — ABNORMAL LOW (ref 14.0–49.7)
MCH: 31.7 pg (ref 25.1–34.0)
MCHC: 33.7 g/dL (ref 31.5–36.0)
MCV: 94.1 fL (ref 79.5–101.0)
MONO#: 0.4 10*3/uL (ref 0.1–0.9)
MONO%: 7.2 % (ref 0.0–14.0)
NEUT#: 4.1 10*3/uL (ref 1.5–6.5)
NEUT%: 75.1 % (ref 38.4–76.8)
Platelets: 140 10*3/uL — ABNORMAL LOW (ref 145–400)
RBC: 4.1 10*6/uL (ref 3.70–5.45)
RDW: 14.3 % (ref 11.2–14.5)
WBC: 5.4 10*3/uL (ref 3.9–10.3)
lymph#: 0.7 10*3/uL — ABNORMAL LOW (ref 0.9–3.3)
nRBC: 0 % (ref 0–0)

## 2012-06-20 MED ORDER — LETROZOLE 2.5 MG PO TABS: 2.5000 mg | ORAL_TABLET | Freq: Every day | ORAL | Status: DC

## 2012-06-20 MED ORDER — TRASTUZUMAB CHEMO INJECTION 440 MG
6.0000 mg/kg | Freq: Once | INTRAVENOUS | Status: AC
  Administered 2012-06-20: 693 mg via INTRAVENOUS
  Filled 2012-06-20: qty 33

## 2012-06-20 MED ORDER — SODIUM CHLORIDE 0.9 % IV SOLN
Freq: Once | INTRAVENOUS | Status: AC
  Administered 2012-06-20: 13:00:00 via INTRAVENOUS

## 2012-06-20 MED ORDER — DIPHENHYDRAMINE HCL 25 MG PO CAPS
25.0000 mg | ORAL_CAPSULE | Freq: Once | ORAL | Status: AC
  Administered 2012-06-20: 25 mg via ORAL

## 2012-06-20 MED ORDER — ACETAMINOPHEN 325 MG PO TABS
650.0000 mg | ORAL_TABLET | Freq: Once | ORAL | Status: AC
  Administered 2012-06-20: 650 mg via ORAL

## 2012-06-20 NOTE — Progress Notes (Signed)
ID: [name]   DOB: 1955/12/04  MR#: [id number]  WUJ#:[id number]  HISTORY OF PRESENT ILLNESS: Rhylee noted a mass in her left breast and brought it to her physician's attention in [place], where she was residing. She was set up for mammography 06/24/2011 at [facility] in [place], [place]. This showed a suspicious abnormality in the left breast measuring approximately 10 mm. Physical examination showed a 1 cm firm superficial mass in the axillary region of the breast and ultrasound showed this to be 11 mm and spiculated.  Biopsy was performed June 25, 2011, and showed ([id number] at the [facility]) and invasive ductal carcinoma, grade 3, estrogen receptor 95% positive, progesterone receptor 40% positive, with an MIB-1 of 29%. The tumor is HER 2 positive by FISH with a ratio of 2.5. There is tumor heterogeneity noted.  With this information the case was presented Jul 08, 2011 at the multidisciplinary  breast cancer clinic. She proceeded to definitive left lumpectomy and sentinel lymph node sampling 08/03/2011. This confirmed a 1.6 cm invasive ductal carcinoma, grade 3. Margins were ample. The two sentinel lymph nodes were clear.  Her subsequent history is as detailed below.  INTERVAL HISTORY: Eliette returns today for followup of her breast carcinoma. She continues to receive trastuzumab every 3 weeks, and is due for her next dose today.   At [facility] last appointment here in September, she had developed a rash which she attributed to the anastrozole. It affected her chest and back, and was itchy and "aggravating". The rash resolved very quickly once she discontinued the anastrozole. It has not recurred.   Interval history is also remarkable for [facility] having started participating in the  [facility] group which she is enjoying. She's also started seeming in her church choir. Overall she is feeling very well, with the exception of some mild fatigue and she continues to take naps on a  daily basis.    REVIEW OF SYSTEMS: Myona denies any recent fevers or chills. No significant hot flashes. She does have some vaginal dryness for which she has trying Replens. She denies any vaginal bleeding, and has had no abnormal bleeding elsewhere.  Her platelets are stable at 140,000. She's had no  nausea, emesis, or diarrhea. No abdominal cramping or pain. No abnormal headaches. She has some occasional pain in her left hip, but no increased arthralgias. No increased pedal edema. No orthopnea, cough or shortness of breath. No chest pain or palpitations.    Otherwise a detailed review of systems is noncontributory.    PAST MEDICAL HISTORY: Past Medical History  Diagnosis Date  . Thrombocytopenia, primary   . Asthma   . Arthritis   . Complication of anesthesia     difficulty waking up/dizzy/lightheaded  . Environmental allergies   . Shortness of breath   . Hypothyroidism   . Blood transfusion   . Anxiety   . Dysrhythmia     irregular heartbeat, takes digoxin  . Chronic edema   . Breast cancer 2012    left breast  . Hx of radiation therapy 12/15/11 - 01/29/12    left breast  . H/O varicella   . History of measles, mumps, or rubella   . TTP (thrombotic thrombocytopenic purpura)   . Hepatitis C   . H/O varicose veins    PAST SURGICAL HISTORY: Past Surgical History  Procedure Date  . Elbow pins     left  . Right hip replacement   . Left foot reconstruction   . Ortho left  foot surg    . Joint replacement     bilat hip  . Portacath placement 08/03/2011    Procedure: INSERTION PORT-A-CATH;  Surgeon: Robyne Askew, MD;  Location: [facility] OR;  Service: General;  Laterality: Right;  . Breast lumpectomy 08/03/11    left lumpectomy and slnbx,T1cN0,triple pos    FAMILY HISTORY Family History  Problem Relation Age of Onset  . Cancer Maternal Grandfather     brain  . Alcohol abuse Maternal Grandfather   . Heart disease Neg Hx   . Hyperlipidemia Neg Hx   . Hypertension Neg Hx     Patient's father died at the age of 72 from emphysema in the setting of tobacco abuse patient's mother is alive at 57 the patient has one brother and one sister there is no history of breast or ovarian cancer in the immediate family    Gynecologic history: She is GX P0 menarche age 10 most recent period July of 2008 she never took hormone replacement   Social History: the patient is a homemaker; she used to work in an office previously. Her husband [name] is a Magazine features editor. They moved here from [facility] Oct 2012.    ADVANCED DIRECTIVES:  HEALTH MAINTENANCE: History  Substance Use Topics  . Smoking status: Never Smoker   . Smokeless tobacco: Never Used  . Alcohol Use: 0.6 oz/week    1 Glasses of wine per week     Colonoscopy:  PAP: Oct 2012  Bone density: Nov 2011/ nl  Lipid panel:  Allergies  Allergen Reactions  . Aspirin     Hx of TTP  . Nsaids     Hx TTP    Current Outpatient Prescriptions  Medication Sig Dispense Refill  . acetaminophen (TYLENOL) 500 MG tablet Take 500 mg by mouth every 6 (six) hours as needed. For pain       . albuterol (PROVENTIL HFA;VENTOLIN HFA) 108 (90 BASE) MCG/ACT inhaler Inhale 2 puffs into the lungs every 6 (six) hours as needed. For shortness of breath       . cholecalciferol (VITAMIN D) 1000 UNITS tablet Take 2,000 Units by mouth daily.        . citalopram (CELEXA) 20 MG tablet TAKE 1 TABLET BY MOUTH EVERY DAILY  90 tablet  1  . clobetasol ointment (TEMOVATE) 0.05 % Apply 1 application topically daily as needed. For psoraisis       . digoxin (LANOXIN) 0.25 MG tablet TAKE 1 TABLET BY MOUTH EVERY DAY  90 tablet  0  . fluticasone (FLONASE) 50 MCG/ACT nasal spray Place 2 sprays into the nose daily.  1 g  0  . glucosamine-chondroitin 500-400 MG tablet Take 1 tablet by mouth daily.       . Halcinonide (HALOG) 0.1 % CREA Apply 1 application topically daily as needed. For eczema      . levothyroxine (SYNTHROID, LEVOTHROID) 50 MCG tablet TAKE 1 TABLET BY  MOUTH EVERY DAY  30 tablet  6  . lidocaine-prilocaine (EMLA) cream Apply topically as needed.        . loperamide (IMODIUM A-D) 2 MG tablet Take 2 mg by mouth 4 (four) times daily as needed. For diarrhea       . Loratadine (CLARITIN) 10 MG CAPS Take by mouth as needed.      . metroNIDAZOLE (METROCREAM) 0.75 % cream Apply topically 2 (two) times daily.  45 g  2  . montelukast (SINGULAIR) 10 MG tablet Take 10 mg by mouth at bedtime.        Marland Kitchen  non-metallic deodorant (ALRA) MISC Apply 1 application topically daily as needed.      . nystatin (MYCOSTATIN) powder Apply topically 2 (two) times daily.  15 g  1  . OVER THE COUNTER MEDICATION Take 1 tablet by mouth daily as needed. For gas pain      . pimecrolimus (ELIDEL) 1 % cream Apply 1 application topically 2 (two) times daily as needed. For exczema       . PULMICORT FLEXHALER 180 MCG/ACT inhaler INHALE 2 PUFFS BY MOUTH EVERY MORNING  3 Inhaler  0  . vitamin B-12 (CYANOCOBALAMIN) 100 MCG tablet Take 2,000 mcg by mouth daily.       . Wound Cleansers (RADIAPLEX EX) Apply topically.      Marland Kitchen letrozole (FEMARA) 2.5 MG tablet Take 1 tablet (2.5 mg total) by mouth daily.  30 tablet  6  . ondansetron (ZOFRAN) 8 MG tablet Take 1 tablet (8 mg total) by mouth every 12 (twelve) hours as needed for nausea.  30 tablet  2  . prochlorperazine (COMPAZINE) 10 MG tablet Take 1 tablet (10 mg total) by mouth every 6 (six) hours as needed.  30 tablet  2  . tobramycin-dexamethasone (TOBRADEX) ophthalmic solution Place 1 drop into both eyes 2 (two) times daily as needed.         No current facility-administered medications for this visit.   Facility-Administered Medications Ordered in Other Visits  Medication Dose Route Frequency Provider Last Rate Last Dose  . 0.9 %  sodium chloride infusion   Intravenous Once Lowella Dell, MD      . acetaminophen (TYLENOL) tablet 650 mg  650 mg Oral Once Lowella Dell, MD   650 mg at 06/20/12 1222  . diphenhydrAMINE (BENADRYL)  capsule 25 mg  25 mg Oral Once Lowella Dell, MD   25 mg at 06/20/12 1222  . trastuzumab (HERCEPTIN) 693 mg in sodium chloride 0.9 % 250 mL chemo infusion  6 mg/kg (Treatment Plan Actual) Intravenous Once Lowella Dell, MD        OBJECTIVE: Middle-aged white woman in no acute distress Filed Vitals:   06/20/12 1116  BP: 125/80  Pulse: 76  Temp: 98.4 F (36.9 C)  Resp: 20     Body mass index is 38.37 kg/(m^2).    ECOG FS: 1 Filed Weights   06/20/12 1116  Weight: 245 lb (111.131 kg)   Physical Exam: HEENT:  Sclerae anicteric.  Oropharynx clear.  Nodes:  No cervical, supraclavicular, or axillary lymphadenopathy palpated.  Breast Exam:  Deferred Lungs:  Clear to auscultation bilaterally.  No crackles, rhonchi, or wheezes.   Heart:  Regular rate and rhythm.   Abdomen:  Soft, obese, nontender.  Positive bowel sounds.   Musculoskeletal:  No focal spinal tenderness to palpation.  Extremities: No peripheral edema or cyanosis.   Neuro:  Nonfocal. Alert and oriented x3.  Skin:  No rash previously noted in the upper chest and back has completely resolved   LAB RESULTS: Lab Results  Component Value Date   WBC 5.4 06/20/2012   NEUTROABS 4.1 06/20/2012   HGB 13.0 06/20/2012   HCT 38.6 06/20/2012   MCV 94.1 06/20/2012   PLT 140* 06/20/2012      Chemistry      Component Value Date/Time   NA 140 05/30/2012 0905   NA 137 05/09/2012 1006   K 4.2 05/30/2012 0905   K 4.3 05/09/2012 1006   CL 102 05/30/2012 0905   CL 102 05/09/2012 1006  CO2 26 05/30/2012 0905   CO2 25 05/09/2012 1006   BUN 9 05/30/2012 0905   BUN 12.0 05/09/2012 1006   CREATININE 0.72 05/30/2012 0905   CREATININE 0.7 05/09/2012 1006   CREATININE 0.70 04/18/2012 1005      Component Value Date/Time   CALCIUM 8.7 05/30/2012 0905   CALCIUM 8.7 05/09/2012 1006   ALKPHOS 70 05/30/2012 0905   ALKPHOS 77 05/09/2012 1006   AST 31 05/30/2012 0905   AST 34 05/09/2012 1006   ALT 32 05/30/2012 0905   ALT 37 05/09/2012 1006   BILITOT 0.8  05/30/2012 0905   BILITOT 1.10 05/09/2012 1006       Lab Results  Component Value Date   LABCA2 30 04/18/2012     STUDIES: 2-D echocardiogram on10/10/2011 showed an ejection fraction of 55 - 60%.    ASSESSMENT: 56 year old [place] woman originally from [place]   1. Status post left lumpectomy and sentinel lymph node sampling 08/03/2011 for a T1C N0 (Stage I) invasive ductal carcinoma, high-grade, triple positive.   2. treated in the adjuvant setting with 4 doses of docetaxel and cyclophosphamide completed 11/16/2011 given along with trastuzumab  3. Trastuzumab to be continued for total of one year (to January 2014)  4. Status post radiation therapy, completed late May of 2013.  5. began on anastrozole in early June 2013, 1 mg daily. Discontinued in September 2013 due to in tolerance (rash). Started on letrozole in October 2013.  7. Comorbidities include a history of remote thrombotic thrombocytopenic purpura and hepatitis C.    PLAN: Zea will proceed to treatment today as scheduled to receive her next dose of trastuzumab.  She'll receive treatment alone on November 11, and we'll see Dr. Darnelle Catalan in early December when she returns for her final dose of trastuzumab on December 2. She is being scheduled for repeat echocardiogram and a followup with Dr. Gala Romney in early January when she has completed her IV therapy. At that time, she'll also be ready to have her port removed.  She does feel like the anastrozole was the culprit for the rash she developed, and, per previous review with Dr. Darnelle Catalan, we will try letrozole instead. We will assess her tolerance when she returns in 6 weeks.  Jonny voices understanding and agreement with our plan, and will call with any changes or problems.   Jeny Nield    06/20/2012

## 2012-06-20 NOTE — Telephone Encounter (Signed)
Echo and Dr. Gala Romney in Jan 2014  Per shantell the patient will be put on the recall list for Jan 2014

## 2012-07-11 ENCOUNTER — Other Ambulatory Visit (HOSPITAL_BASED_OUTPATIENT_CLINIC_OR_DEPARTMENT_OTHER): Payer: 59 | Admitting: Lab

## 2012-07-11 ENCOUNTER — Ambulatory Visit (HOSPITAL_BASED_OUTPATIENT_CLINIC_OR_DEPARTMENT_OTHER): Payer: 59

## 2012-07-11 VITALS — BP 137/77 | HR 79 | Temp 98.1°F | Resp 20

## 2012-07-11 DIAGNOSIS — Z5112 Encounter for antineoplastic immunotherapy: Secondary | ICD-10-CM

## 2012-07-11 DIAGNOSIS — C50619 Malignant neoplasm of axillary tail of unspecified female breast: Secondary | ICD-10-CM

## 2012-07-11 DIAGNOSIS — C50919 Malignant neoplasm of unspecified site of unspecified female breast: Secondary | ICD-10-CM

## 2012-07-11 LAB — COMPREHENSIVE METABOLIC PANEL (CC13)
ALT: 49 U/L (ref 0–55)
AST: 39 U/L — ABNORMAL HIGH (ref 5–34)
Albumin: 3.6 g/dL (ref 3.5–5.0)
Alkaline Phosphatase: 72 U/L (ref 40–150)
BUN: 11 mg/dL (ref 7.0–26.0)
CO2: 27 mEq/L (ref 22–29)
Calcium: 9.1 mg/dL (ref 8.4–10.4)
Chloride: 104 mEq/L (ref 98–107)
Creatinine: 0.8 mg/dL (ref 0.6–1.1)
Glucose: 115 mg/dl — ABNORMAL HIGH (ref 70–99)
Potassium: 4.1 mEq/L (ref 3.5–5.1)
Sodium: 139 mEq/L (ref 136–145)
Total Bilirubin: 0.8 mg/dL (ref 0.20–1.20)
Total Protein: 7 g/dL (ref 6.4–8.3)

## 2012-07-11 LAB — CBC WITH DIFFERENTIAL/PLATELET
BASO%: 0.6 % (ref 0.0–2.0)
Basophils Absolute: 0 10*3/uL (ref 0.0–0.1)
EOS%: 6.6 % (ref 0.0–7.0)
Eosinophils Absolute: 0.3 10*3/uL (ref 0.0–0.5)
HCT: 39.3 % (ref 34.8–46.6)
HGB: 12.9 g/dL (ref 11.6–15.9)
LYMPH%: 13.2 % — ABNORMAL LOW (ref 14.0–49.7)
MCH: 31.4 pg (ref 25.1–34.0)
MCHC: 32.8 g/dL (ref 31.5–36.0)
MCV: 95.6 fL (ref 79.5–101.0)
MONO#: 0.3 10*3/uL (ref 0.1–0.9)
MONO%: 6.6 % (ref 0.0–14.0)
NEUT#: 3.4 10*3/uL (ref 1.5–6.5)
NEUT%: 73 % (ref 38.4–76.8)
Platelets: 141 10*3/uL — ABNORMAL LOW (ref 145–400)
RBC: 4.11 10*6/uL (ref 3.70–5.45)
RDW: 14.2 % (ref 11.2–14.5)
WBC: 4.7 10*3/uL (ref 3.9–10.3)
lymph#: 0.6 10*3/uL — ABNORMAL LOW (ref 0.9–3.3)
nRBC: 0 % (ref 0–0)

## 2012-07-11 MED ORDER — SODIUM CHLORIDE 0.9 % IJ SOLN
10.0000 mL | INTRAMUSCULAR | Status: DC | PRN
  Administered 2012-07-11: 10 mL
  Filled 2012-07-11: qty 10

## 2012-07-11 MED ORDER — HEPARIN SOD (PORK) LOCK FLUSH 100 UNIT/ML IV SOLN
500.0000 [IU] | Freq: Once | INTRAVENOUS | Status: AC | PRN
  Administered 2012-07-11: 500 [IU]
  Filled 2012-07-11: qty 5

## 2012-07-11 MED ORDER — DIPHENHYDRAMINE HCL 25 MG PO CAPS: 25.0000 mg | ORAL_CAPSULE | Freq: Once | ORAL | Status: DC

## 2012-07-11 MED ORDER — ACETAMINOPHEN 325 MG PO TABS
650.0000 mg | ORAL_TABLET | Freq: Once | ORAL | Status: AC
  Administered 2012-07-11: 650 mg via ORAL

## 2012-07-11 MED ORDER — SODIUM CHLORIDE 0.9 % IV SOLN
Freq: Once | INTRAVENOUS | Status: AC
  Administered 2012-07-11: 10:00:00 via INTRAVENOUS

## 2012-07-11 MED ORDER — TRASTUZUMAB CHEMO INJECTION 440 MG
6.0000 mg/kg | Freq: Once | INTRAVENOUS | Status: AC
  Administered 2012-07-11: 693 mg via INTRAVENOUS
  Filled 2012-07-11: qty 33

## 2012-07-11 NOTE — Patient Instructions (Signed)
Patient aware of next appointment; discharged home with no complaints. 

## 2012-07-20 ENCOUNTER — Other Ambulatory Visit: Payer: Self-pay | Admitting: Internal Medicine

## 2012-07-26 ENCOUNTER — Encounter (INDEPENDENT_AMBULATORY_CARE_PROVIDER_SITE_OTHER): Payer: Self-pay | Admitting: General Surgery

## 2012-07-26 ENCOUNTER — Ambulatory Visit (INDEPENDENT_AMBULATORY_CARE_PROVIDER_SITE_OTHER): Payer: 59 | Admitting: General Surgery

## 2012-07-26 VITALS — BP 126/78 | HR 72 | Temp 97.7°F | Resp 16 | Ht 67.0 in | Wt 248.4 lb

## 2012-07-26 DIAGNOSIS — C50919 Malignant neoplasm of unspecified site of unspecified female breast: Secondary | ICD-10-CM

## 2012-07-26 NOTE — Patient Instructions (Signed)
Continue regular self exams Continue femara 

## 2012-07-27 ENCOUNTER — Telehealth: Payer: Self-pay

## 2012-07-27 MED ORDER — MONTELUKAST SODIUM 10 MG PO TABS: 10.0000 mg | ORAL_TABLET | Freq: Every day | ORAL | Status: DC

## 2012-07-27 MED ORDER — DIGOXIN 250 MCG PO TABS: 0.2500 mg | ORAL_TABLET | Freq: Every day | ORAL | Status: DC

## 2012-07-27 NOTE — Telephone Encounter (Signed)
Refills authorized today

## 2012-08-01 ENCOUNTER — Ambulatory Visit (HOSPITAL_BASED_OUTPATIENT_CLINIC_OR_DEPARTMENT_OTHER): Payer: 59

## 2012-08-01 ENCOUNTER — Telehealth: Payer: Self-pay | Admitting: *Deleted

## 2012-08-01 ENCOUNTER — Ambulatory Visit (HOSPITAL_BASED_OUTPATIENT_CLINIC_OR_DEPARTMENT_OTHER): Payer: 59 | Admitting: Oncology

## 2012-08-01 ENCOUNTER — Other Ambulatory Visit (HOSPITAL_BASED_OUTPATIENT_CLINIC_OR_DEPARTMENT_OTHER): Payer: 59 | Admitting: Lab

## 2012-08-01 VITALS — BP 122/77 | HR 78 | Temp 97.8°F | Resp 20 | Ht 67.0 in | Wt 248.5 lb

## 2012-08-01 DIAGNOSIS — C50419 Malignant neoplasm of upper-outer quadrant of unspecified female breast: Secondary | ICD-10-CM

## 2012-08-01 DIAGNOSIS — R21 Rash and other nonspecific skin eruption: Secondary | ICD-10-CM

## 2012-08-01 DIAGNOSIS — C50919 Malignant neoplasm of unspecified site of unspecified female breast: Secondary | ICD-10-CM

## 2012-08-01 DIAGNOSIS — Z5112 Encounter for antineoplastic immunotherapy: Secondary | ICD-10-CM

## 2012-08-01 DIAGNOSIS — Z170 Estrogen receptor positive status [ER+]: Secondary | ICD-10-CM

## 2012-08-01 LAB — CBC WITH DIFFERENTIAL/PLATELET
BASO%: 0.7 % (ref 0.0–2.0)
Basophils Absolute: 0 10*3/uL (ref 0.0–0.1)
EOS%: 5.1 % (ref 0.0–7.0)
Eosinophils Absolute: 0.3 10*3/uL (ref 0.0–0.5)
HCT: 39.4 % (ref 34.8–46.6)
HGB: 12.9 g/dL (ref 11.6–15.9)
LYMPH%: 10.6 % — ABNORMAL LOW (ref 14.0–49.7)
MCH: 31.2 pg (ref 25.1–34.0)
MCHC: 32.7 g/dL (ref 31.5–36.0)
MCV: 95.4 fL (ref 79.5–101.0)
MONO#: 0.5 10*3/uL (ref 0.1–0.9)
MONO%: 9 % (ref 0.0–14.0)
NEUT#: 4.2 10*3/uL (ref 1.5–6.5)
NEUT%: 74.6 % (ref 38.4–76.8)
Platelets: 141 10*3/uL — ABNORMAL LOW (ref 145–400)
RBC: 4.13 10*6/uL (ref 3.70–5.45)
RDW: 14.3 % (ref 11.2–14.5)
WBC: 5.7 10*3/uL (ref 3.9–10.3)
lymph#: 0.6 10*3/uL — ABNORMAL LOW (ref 0.9–3.3)
nRBC: 0 % (ref 0–0)

## 2012-08-01 LAB — COMPREHENSIVE METABOLIC PANEL (CC13)
ALT: 48 U/L (ref 0–55)
AST: 38 U/L — ABNORMAL HIGH (ref 5–34)
Albumin: 3.8 g/dL (ref 3.5–5.0)
Alkaline Phosphatase: 79 U/L (ref 40–150)
BUN: 15 mg/dL (ref 7.0–26.0)
CO2: 26 mEq/L (ref 22–29)
Calcium: 9.1 mg/dL (ref 8.4–10.4)
Chloride: 102 mEq/L (ref 98–107)
Creatinine: 0.8 mg/dL (ref 0.6–1.1)
Glucose: 97 mg/dl (ref 70–99)
Potassium: 4.3 mEq/L (ref 3.5–5.1)
Sodium: 140 mEq/L (ref 136–145)
Total Bilirubin: 0.73 mg/dL (ref 0.20–1.20)
Total Protein: 7.2 g/dL (ref 6.4–8.3)

## 2012-08-01 MED ORDER — HEPARIN SOD (PORK) LOCK FLUSH 100 UNIT/ML IV SOLN
500.0000 [IU] | Freq: Once | INTRAVENOUS | Status: AC | PRN
  Administered 2012-08-01: 500 [IU]
  Filled 2012-08-01: qty 5

## 2012-08-01 MED ORDER — SODIUM CHLORIDE 0.9 % IJ SOLN
10.0000 mL | INTRAMUSCULAR | Status: DC | PRN
  Administered 2012-08-01: 10 mL
  Filled 2012-08-01: qty 10

## 2012-08-01 MED ORDER — SODIUM CHLORIDE 0.9 % IV SOLN
Freq: Once | INTRAVENOUS | Status: AC
  Administered 2012-08-01: 12:00:00 via INTRAVENOUS

## 2012-08-01 MED ORDER — LETROZOLE 2.5 MG PO TABS: 2.5000 mg | ORAL_TABLET | Freq: Every day | ORAL | Status: DC

## 2012-08-01 MED ORDER — ACETAMINOPHEN 325 MG PO TABS
650.0000 mg | ORAL_TABLET | Freq: Once | ORAL | Status: AC
  Administered 2012-08-01: 650 mg via ORAL

## 2012-08-01 MED ORDER — DIPHENHYDRAMINE HCL 25 MG PO CAPS
25.0000 mg | ORAL_CAPSULE | Freq: Once | ORAL | Status: AC
  Administered 2012-08-01: 25 mg via ORAL

## 2012-08-01 MED ORDER — TRASTUZUMAB CHEMO INJECTION 440 MG
6.0000 mg/kg | Freq: Once | INTRAVENOUS | Status: AC
  Administered 2012-08-01: 693 mg via INTRAVENOUS
  Filled 2012-08-01: qty 33

## 2012-08-01 NOTE — Patient Instructions (Addendum)
[facility] Discharge Instructions for Patients Receiving Chemotherapy  Today you received the following chemotherapy agents Herceptin  To help prevent nausea and vomiting after your treatment, we encourage you to take your nausea medication as prescribed Begin taking it as needed and take it as often as prescribed as needed.   If you develop nausea and vomiting that is not controlled by your nausea medication, call the clinic. If it is after clinic hours your family physician or the after hours number for the clinic or go to the Emergency Department.   BELOW ARE SYMPTOMS THAT SHOULD BE REPORTED IMMEDIATELY:  *FEVER GREATER THAN 100.5 F  *CHILLS WITH OR WITHOUT FEVER  NAUSEA AND VOMITING THAT IS NOT CONTROLLED WITH YOUR NAUSEA MEDICATION  *UNUSUAL SHORTNESS OF BREATH  *UNUSUAL BRUISING OR BLEEDING  TENDERNESS IN MOUTH AND THROAT WITH OR WITHOUT PRESENCE OF ULCERS  *URINARY PROBLEMS  *BOWEL PROBLEMS  UNUSUAL RASH Items with * indicate a potential emergency and should be followed up as soon as possible.  One of the nurses will contact you 24 hours after your treatment. Please let the nurse know about any problems that you may have experienced. Feel free to call the clinic you have any questions or concerns. The clinic phone number is [phone].   I have been informed and understand all the instructions given to me. I know to contact the clinic, my physician, or go to the Emergency Department if any problems should occur. I do not have any questions at this time, but understand that I may call the clinic during office hours   should I have any questions or need assistance in obtaining follow up care.    __________________________________________  _____________  __________ Signature of Patient or Authorized Representative            Date                   Time    __________________________________________ Nurse's Signature

## 2012-08-01 NOTE — Progress Notes (Signed)
ID: Tara [name]   DOB: 1955-10-09  MR#: [id number]  WGN#:[id number]  HISTORY OF PRESENT ILLNESS: Tara [name] noted a mass in her left breast and brought it to her physician's attention in [place], where she was residing. She was set up for mammography 06/24/2011 at [facility] in [place], [place]. This showed a suspicious abnormality in the left breast measuring approximately 10 mm. Physical examination showed a 1 cm firm superficial mass in the axillary region of the breast and ultrasound showed this to be 11 mm and spiculated.  Biopsy was performed June 25, 2011, and showed ([id number] at the [facility]) and invasive ductal carcinoma, grade 3, estrogen receptor 95% positive, progesterone receptor 40% positive, with an MIB-1 of 29%. The tumor is HER 2 positive by FISH with a ratio of 2.5. There is tumor heterogeneity noted.  With this information the case was presented Jul 08, 2011 at the multidisciplinary  breast cancer clinic. She proceeded to definitive left lumpectomy and sentinel lymph node sampling 08/03/2011. This confirmed a 1.6 cm invasive ductal carcinoma, grade 3. Margins were ample. The two sentinel lymph nodes were clear.  Her subsequent history is as detailed below.  INTERVAL HISTORY: Tara [name] returns today with her husband [name] for followup of her breast carcinoma. Today is her last day of trastuzumab, and she is very excited about this. She would like to have her port removed as soon as possible.    REVIEW OF SYSTEMS: She recently joined a gym, and she is also working her daughter on a daily basis. She is tolerating the letrozole with minimal side effects, although she reports a little morning stiffness. This resolves with activity. She does not have ongoing pain in her joints other than "what you might expect from my age". She does have a rash on her back that she wanted me to look at. She describes herself is slightly fatigued. She has a little bit of a runny  nose. She has one or 2 bowel movements daily, occasionally loose. She describes herself is forgetful. Otherwise a detailed review of systems today was noncontributory   PAST MEDICAL HISTORY: Past Medical History  Diagnosis Date  . Thrombocytopenia, primary   . Asthma   . Arthritis   . Complication of anesthesia     difficulty waking up/dizzy/lightheaded  . Environmental allergies   . Shortness of breath   . Hypothyroidism   . Blood transfusion   . Anxiety   . Dysrhythmia     irregular heartbeat, takes digoxin  . Chronic edema   . Breast cancer 2012    left breast  . Hx of radiation therapy 12/15/11 - 01/29/12    left breast  . H/O varicella   . History of measles, mumps, or rubella   . TTP (thrombotic thrombocytopenic purpura)   . Hepatitis C   . H/O varicose veins    PAST SURGICAL HISTORY: Past Surgical History  Procedure Date  . Elbow pins     left  . Right hip replacement   . Left foot reconstruction   . Ortho left foot surg    . Joint replacement     bilat hip  . Portacath placement 08/03/2011    Procedure: INSERTION PORT-A-CATH;  Surgeon: Robyne Askew, MD;  Location: [facility] OR;  Service: General;  Laterality: Right;  . Breast lumpectomy 08/03/11    left lumpectomy and slnbx,T1cN0,triple pos    FAMILY HISTORY Family History  Problem Relation Age of Onset  . Cancer Maternal Grandfather  brain  . Alcohol abuse Maternal Grandfather   . Heart disease Neg Hx   . Hyperlipidemia Neg Hx   . Hypertension Neg Hx   Patient's father died at the age of 62 from emphysema in the setting of tobacco abuse; patient's mother is alive. The patient has one brother and one sister; there is no history of breast or ovarian cancer in the immediate family   Gynecologic history: She is GX P0 menarche age 36 most recent period July of 2008 she never took hormone replacement   Social History: the patient is a homemaker; she used to work in an office previously. Her husband [name] is a  Magazine features editor. They moved here from [facility] Oct 2012. Her dog's name is Glass blower/designer (a Marine scientist)   ADVANCED DIRECTIVES:  HEALTH MAINTENANCE: History  Substance Use Topics  . Smoking status: Never Smoker   . Smokeless tobacco: Never Used  . Alcohol Use: 0.6 oz/week    1 Glasses of wine per week     Colonoscopy:  PAP: Oct 2012  Bone density: Nov 2011/ nl  Lipid panel:  Allergies  Allergen Reactions  . Aspirin     Hx of TTP  . Nsaids     Hx TTP    Current Outpatient Prescriptions  Medication Sig Dispense Refill  . acetaminophen (TYLENOL) 500 MG tablet Take 500 mg by mouth every 6 (six) hours as needed. For pain       . albuterol (PROVENTIL HFA;VENTOLIN HFA) 108 (90 BASE) MCG/ACT inhaler Inhale 2 puffs into the lungs every 6 (six) hours as needed. For shortness of breath       . cholecalciferol (VITAMIN D) 1000 UNITS tablet Take 2,000 Units by mouth daily.        . citalopram (CELEXA) 20 MG tablet TAKE 1 TABLET BY MOUTH EVERY DAILY  90 tablet  1  . clobetasol ointment (TEMOVATE) 0.05 % Apply 1 application topically daily as needed. For psoraisis       . digoxin (LANOXIN) 0.25 MG tablet Take 1 tablet (0.25 mg total) by mouth daily.  90 tablet  3  . fluticasone (FLONASE) 50 MCG/ACT nasal spray Place 2 sprays into the nose daily.  1 g  0  . glucosamine-chondroitin 500-400 MG tablet Take 1 tablet by mouth daily.       . Halcinonide (HALOG) 0.1 % CREA Apply 1 application topically daily as needed. For eczema      . letrozole (FEMARA) 2.5 MG tablet Take 1 tablet (2.5 mg total) by mouth daily.  30 tablet  6  . levothyroxine (SYNTHROID, LEVOTHROID) 50 MCG tablet TAKE 1 TABLET BY MOUTH EVERY DAY  30 tablet  6  . lidocaine-prilocaine (EMLA) cream Apply topically as needed.        . loperamide (IMODIUM A-D) 2 MG tablet Take 2 mg by mouth 4 (four) times daily as needed. For diarrhea       . Loratadine (CLARITIN) 10 MG CAPS Take by mouth as needed.      . metroNIDAZOLE (METROCREAM)  0.75 % cream Apply topically 2 (two) times daily.  45 g  2  . montelukast (SINGULAIR) 10 MG tablet Take 1 tablet (10 mg total) by mouth at bedtime.  90 tablet  3  . nystatin (MYCOSTATIN) powder Apply topically 2 (two) times daily.  15 g  1  . OVER THE COUNTER MEDICATION Take 1 tablet by mouth daily as needed. For gas pain      . pimecrolimus (ELIDEL) 1 %  cream Apply 1 application topically 2 (two) times daily as needed. For exczema       . PULMICORT FLEXHALER 180 MCG/ACT inhaler INHALE 2 PUFFS BY MOUTH EVERY MORNING  3 Inhaler  0  . vitamin B-12 (CYANOCOBALAMIN) 100 MCG tablet Take 2,000 mcg by mouth daily.       . Wound Cleansers (RADIAPLEX EX) Apply topically.        OBJECTIVE: Middle-aged white woman in no acute distress Filed Vitals:   08/01/12 1008  BP: 122/77  Pulse: 78  Temp: 97.8 F (36.6 C)  Resp: 20     Body mass index is 38.92 kg/(m^2).    ECOG FS: 1 Filed Weights   08/01/12 1008  Weight: 248 lb 8 oz (112.719 kg)    Sclerae unicteric Oropharynx clear No cervical or supraclavicular adenopathy Lungs no rales or rhonchi Heart regular rate and rhythm Abd benign MSK no focal spinal tenderness, no peripheral edema Neuro: nonfocal Breasts: The right breast is unremarkable; the left breast is status post lumpectomy and radiation; there is no evidence of local recurrence. The left axilla is benign Didn't skin: The rash is in the upper back across the midline, palpable, erythematous, confluent, and very well demarcated. This looks very much like a contact dermatitis to me. It is not consistent with a drug rash  LAB RESULTS: Lab Results  Component Value Date   WBC 5.7 08/01/2012   NEUTROABS 4.2 08/01/2012   HGB 12.9 08/01/2012   HCT 39.4 08/01/2012   MCV 95.4 08/01/2012   PLT 141* 08/01/2012      Chemistry      Component Value Date/Time   NA 139 07/11/2012 0935   NA 140 05/30/2012 0905   K 4.1 07/11/2012 0935   K 4.2 05/30/2012 0905   CL 104 07/11/2012 0935   CL 102  05/30/2012 0905   CO2 27 07/11/2012 0935   CO2 26 05/30/2012 0905   BUN 11.0 07/11/2012 0935   BUN 9 05/30/2012 0905   CREATININE 0.8 07/11/2012 0935   CREATININE 0.72 05/30/2012 0905   CREATININE 0.70 04/18/2012 1005      Component Value Date/Time   CALCIUM 9.1 07/11/2012 0935   CALCIUM 8.7 05/30/2012 0905   ALKPHOS 72 07/11/2012 0935   ALKPHOS 70 05/30/2012 0905   AST 39* 07/11/2012 0935   AST 31 05/30/2012 0905   ALT 49 07/11/2012 0935   ALT 32 05/30/2012 0905   BILITOT 0.80 07/11/2012 0935   BILITOT 0.8 05/30/2012 0905       Lab Results  Component Value Date   LABCA2 30 04/18/2012     STUDIES: 2-D echocardiogram on10/10/2011 showed an ejection fraction of 55 - 60%.    ASSESSMENT: 56 year old [place] woman originally from [place]   1. Status post left lumpectomy and sentinel lymph node sampling 08/03/2011 for a T1c N0 (Stage IA) invasive ductal carcinoma, high-grade, triple positive.   2. treated in the adjuvant setting with 4 doses of docetaxel and cyclophosphamide completed 11/16/2011 given along with trastuzumab  3. Trastuzumab continued to total of one year (to December 2013)  4. Status post radiation therapy, completed late May of 2013.  5. began on anastrozole in early June 2013, 1 mg daily. Discontinued in September 2013 due to in tolerance (rash). Started on letrozole in October 2013.  7. Comorbidities include a history of remote thrombotic thrombocytopenic purpura and hepatitis C.    PLAN: Kameo will complete her trastuzumab today. She will meet with Dr. Carolynne Edouard in the near  future to have her port removed. The plan is to continue letrozole for a total of 5 years. She will have a repeat echocardiogram under Dr. Augustina Mood sometime in January. She will see [name] again in 4 months. She will see me specifically again next October, after her mammography. At that point we will start yearly followup.  As far as her rash in the upper back is concerned I suggested she start  cortisone cream. She is going to be in [place] over the holidays and already has an appointment with her dermatologist there. She knows to call for any problems that may develop before the next visit.   MAGRINAT,GUSTAV C    08/01/2012

## 2012-08-01 NOTE — Telephone Encounter (Signed)
Sent dr.toth nurse an email to have port removed  Gave patient for four month appointment

## 2012-08-02 ENCOUNTER — Telehealth (INDEPENDENT_AMBULATORY_CARE_PROVIDER_SITE_OTHER): Payer: Self-pay | Admitting: General Surgery

## 2012-08-02 ENCOUNTER — Other Ambulatory Visit: Payer: Self-pay | Admitting: Certified Registered Nurse Anesthetist

## 2012-08-02 NOTE — Telephone Encounter (Signed)
Pt called to report her appt was yesterday with Dr. Darnelle Catalan , who stated she can have her PAC removed now that her chemo is done.  Asking if Dr. Carolynne Edouard can do this for her this week.  Please advise her.

## 2012-08-03 ENCOUNTER — Other Ambulatory Visit (INDEPENDENT_AMBULATORY_CARE_PROVIDER_SITE_OTHER): Payer: Self-pay | Admitting: General Surgery

## 2012-08-03 NOTE — Telephone Encounter (Signed)
I spoke with the patient and informed her that it probably could not be done this week.  She said she understood and that she would still like to proceed with removing it whenever ou have the chance.  I have the paper orders ready for you to fill out when you come into the office on Thursday.

## 2012-08-03 NOTE — Telephone Encounter (Signed)
We can fill out orders. Not sure when it can be done

## 2012-08-04 NOTE — Progress Notes (Signed)
Subjective:     Patient ID: Tara [name], female   DOB: 1956-08-21, 56 y.o.   MRN: [id number]  HPI The patient is a 56 year old white female who is one year status post left breast lumpectomy and negative sentinel node biopsy for a T1 C. N0 left breast cancer. She was a triple positive. She has one more dose of Herceptin to receive. She was switched from Femara because of a rash. Otherwise she is doing well. She has no complaints today. She denies any breast pain. She denies any discharge from her nipple.  Review of Systems  Constitutional: Negative.   HENT: Negative.   Eyes: Negative.   Respiratory: Negative.   Cardiovascular: Negative.   Gastrointestinal: Negative.   Genitourinary: Negative.   Musculoskeletal: Negative.   Skin: Negative.   Neurological: Negative.   Hematological: Negative.   Psychiatric/Behavioral: Negative.        Objective:   Physical Exam  Constitutional: She is oriented to person, place, and time. She appears well-developed and well-nourished.  HENT:  Head: Normocephalic and atraumatic.  Eyes: Conjunctivae normal and EOM are normal. Pupils are equal, round, and reactive to light.  Neck: Normal range of motion. Neck supple.  Cardiovascular: Normal rate, regular rhythm and normal heart sounds.   Pulmonary/Chest: Effort normal and breath sounds normal.       Her left breast incision is healing nicely. There is no palpable mass in either breast. There is no palpable axillary or supraclavicular cervical lymphadenopathy.  Abdominal: Soft. Bowel sounds are normal. She exhibits no mass. There is no tenderness.  Musculoskeletal: Normal range of motion.  Neurological: She is alert and oriented to person, place, and time.  Skin: Skin is warm and dry.  Psychiatric: She has a normal mood and affect. Her behavior is normal.       Assessment:     1 year status post left breast lumpectomy for breast cancer    Plan:     At this point she will continue to do regular  self exams. She will continue to follow with the medical oncologist to decide which aromatase inhibitor she will be on. We will plan to see her back in about 3 months

## 2012-08-19 DIAGNOSIS — Z452 Encounter for adjustment and management of vascular access device: Secondary | ICD-10-CM

## 2012-09-15 ENCOUNTER — Other Ambulatory Visit: Payer: Self-pay

## 2012-09-15 ENCOUNTER — Other Ambulatory Visit: Payer: Self-pay | Admitting: Internal Medicine

## 2012-09-15 MED ORDER — DIGOXIN 250 MCG PO TABS: 0.2500 mg | ORAL_TABLET | Freq: Every day | ORAL | Status: DC

## 2012-10-06 ENCOUNTER — Ambulatory Visit (HOSPITAL_COMMUNITY)
Admission: RE | Admit: 2012-10-06 | Discharge: 2012-10-06 | Disposition: A | Discharge status: Home / Self Care | Payer: 59 | Source: Ambulatory Visit | Attending: Internal Medicine | Admitting: Internal Medicine

## 2012-10-06 ENCOUNTER — Encounter (HOSPITAL_COMMUNITY): Payer: Self-pay

## 2012-10-06 ENCOUNTER — Ambulatory Visit (HOSPITAL_BASED_OUTPATIENT_CLINIC_OR_DEPARTMENT_OTHER)
Admission: RE | Admit: 2012-10-06 | Discharge: 2012-10-06 | Disposition: A | Discharge status: Home / Self Care | Payer: 59 | Source: Ambulatory Visit | Attending: Internal Medicine | Admitting: Internal Medicine

## 2012-10-06 VITALS — BP 136/72 | HR 80 | Wt 249.4 lb

## 2012-10-06 DIAGNOSIS — I059 Rheumatic mitral valve disease, unspecified: Secondary | ICD-10-CM | POA: Insufficient documentation

## 2012-10-06 DIAGNOSIS — Z09 Encounter for follow-up examination after completed treatment for conditions other than malignant neoplasm: Secondary | ICD-10-CM | POA: Insufficient documentation

## 2012-10-06 DIAGNOSIS — C50919 Malignant neoplasm of unspecified site of unspecified female breast: Secondary | ICD-10-CM

## 2012-10-06 NOTE — Progress Notes (Signed)
Patient ID: Tara [name], female   DOB: February 15, 1956, 57 y.o.   MRN: [id number]  Referring Physician:  Magrinat Reason for Consultation: Herceptin monitoring HPI: Tara [name] is a 57 y/o woman with h/o HCV, asthma, palpitations, chronic LE edema, TTP and obesity.  Recently found to have breast CA in [place], where she was residing until recently. She was set up for mammography 06/24/2011 at [facility] in [place], [place]. This showed a suspicious abnormality in the left breast measuring approximately 10 mm. Physical examination showed a 1 cm firm superficial mass in the axillary region of the breast and ultrasound showed this to be 11 mm and spiculated.  Biopsy was performed June 25, 2011, and showed ([id number] at the [facility]) and invasive ductal carcinoma, grade 3, estrogen receptor 95% positive, progesterone receptor 40% positive, with an MIB-1 of 29%. The tumor is HER 2 positive by FISH with a ratio of 2.5. There is tumor heterogeneity noted.  With this information the case was presented Jul 08, 2011 at the multidisciplinary breast cancer conference and ithe patient was evaluated that afternoon at the multidisciplinary breast cancer clinic. She proceeded to definitive left lumpectomy and sentinel lymph node sampling 08/03/2011. This confirmed a 1.6 cm invasive ductal carcinoma, grade 3. Margins were ample. The two sentinel lymph nodes were clear. The decision was made to proceed with 4 cycles of adjuvant chemotherapy consisting of docetaxel/cyclophosphamide given with trastuzumab every 3 weeks with Neulasta on day 2 for granulocyte support. Trastuzumab will be continued for total of one year.  She has now completed her adjuvant chemo. Completed Herceptin 07/2012.   07/22/11: EF 60% Poor windows. Pseudonormal filling pattern. Unable to see s' well. ~8cm/sec 10/13/11: EF 60% Grade 1 DD. Lateral s' 9.8 cm/s 02/25/12 EF60%  lateral s' 9.6 06/01/12 EF 60% Lateral S' 9.8 10/06/12  EF 60% lateral S' 9.6  She returns for follow up. Denies SOB/PND/Orthopnea. She has completed herceptin.  Denies lower extremity edema. Unemployed.        Past Medical History  Diagnosis Date  . Thrombocytopenia, primary   . Asthma   . Arthritis   . Complication of anesthesia     difficulty waking up/dizzy/lightheaded  . Environmental allergies   . Shortness of breath   . Hypothyroidism   . Blood transfusion   . Anxiety   . Dysrhythmia     irregular heartbeat, takes digoxin  . Chronic edema   . Breast cancer 2012    left breast  . Hx of radiation therapy 12/15/11 - 01/29/12    left breast  . H/O varicella   . History of measles, mumps, or rubella   . TTP (thrombotic thrombocytopenic purpura)   . Hepatitis C   . H/O varicose veins          Allergies  Allergen Reactions  . Aspirin     Hx of TTP  . Nsaids     Hx TTP    History   Social History  . Marital Status: Married    Spouse Name: N/A    Number of Children: N/A  . Years of Education: N/A   Occupational History  . Not on file.   Social History Main Topics  . Smoking status: Never Smoker   . Smokeless tobacco: Never Used  . Alcohol Use: 0.6 oz/week    1 Glasses of wine per week  . Drug Use: No  . Sexually Active: Not Currently   Other Topics Concern  . Not on file  Social History Narrative  . No narrative on file    Family History  Problem Relation Age of Onset  . Cancer Maternal Grandfather     brain  . Alcohol abuse Maternal Grandfather   . Heart disease Neg Hx   . Hyperlipidemia Neg Hx   . Hypertension Neg Hx     PHYSICAL EXAM: Filed Vitals:   10/06/12 1155  BP: 136/72  Pulse: 80    General:  Well appearing. No respiratory difficulty HEENT: normal. alopecic Neck: supple. no JVD. Carotids 2+ bilat; no bruits. No lymphadenopathy or thryomegaly appreciated. Cor: PMI nondisplaced. Regular rate & rhythm. No rubs or murmurs. +s4 Lungs: clear Abdomen: obese soft, nontender,  nondistended. No hepatosplenomegaly. No bruits or masses. Good bowel sounds. Extremities: large. no cyanosis, clubbing, rash, edema Neuro: alert & oriented x 3, cranial nerves grossly intact. moves all 4 extremities w/o difficulty. Affect pleasant.   ASSESSMENT/PLAN:

## 2012-10-06 NOTE — Assessment & Plan Note (Addendum)
Dr Gala Romney discussed and reviewed ECHO. EF and lateral S' stable. No evidence of cardiotoxicity post Herceptin. Follow up as needed   Patient seen and examined with Tonye Becket, NP. We discussed all aspects of the encounter. I agree with the assessment and plan as stated above.  I reviewed echos personally. EF and Doppler parameters stable. No HF on exam. She has completed her Herceptin therapy. Can f/u as needed.

## 2012-10-06 NOTE — Progress Notes (Signed)
  Echocardiogram 2D Echocardiogram has been performed.  Tara [name] 10/06/2012, 2:40 PM

## 2012-10-06 NOTE — Patient Instructions (Addendum)
Follow up as needed

## 2012-10-13 ENCOUNTER — Other Ambulatory Visit: Payer: Self-pay | Admitting: Internal Medicine

## 2012-10-14 ENCOUNTER — Other Ambulatory Visit: Payer: Self-pay | Admitting: Internal Medicine

## 2012-10-20 ENCOUNTER — Other Ambulatory Visit: Payer: Self-pay | Admitting: Internal Medicine

## 2012-10-21 ENCOUNTER — Other Ambulatory Visit: Payer: Self-pay | Admitting: Internal Medicine

## 2012-10-24 ENCOUNTER — Ambulatory Visit (INDEPENDENT_AMBULATORY_CARE_PROVIDER_SITE_OTHER): Payer: 59 | Admitting: General Surgery

## 2012-10-26 ENCOUNTER — Other Ambulatory Visit: Payer: Self-pay

## 2012-10-26 DIAGNOSIS — E039 Hypothyroidism, unspecified: Secondary | ICD-10-CM

## 2012-10-26 MED ORDER — LEVOTHYROXINE SODIUM 50 MCG PO TABS: ORAL_TABLET | ORAL | Status: DC

## 2012-10-29 ENCOUNTER — Encounter: Payer: Self-pay | Admitting: Internal Medicine

## 2012-10-29 DIAGNOSIS — J309 Allergic rhinitis, unspecified: Secondary | ICD-10-CM | POA: Insufficient documentation

## 2012-10-29 DIAGNOSIS — B182 Chronic viral hepatitis C: Secondary | ICD-10-CM | POA: Insufficient documentation

## 2012-10-29 DIAGNOSIS — J3089 Other allergic rhinitis: Secondary | ICD-10-CM | POA: Insufficient documentation

## 2012-10-29 DIAGNOSIS — Z96643 Presence of artificial hip joint, bilateral: Secondary | ICD-10-CM | POA: Insufficient documentation

## 2012-10-29 DIAGNOSIS — J45909 Unspecified asthma, uncomplicated: Secondary | ICD-10-CM | POA: Insufficient documentation

## 2012-10-29 DIAGNOSIS — Z862 Personal history of diseases of the blood and blood-forming organs and certain disorders involving the immune mechanism: Secondary | ICD-10-CM | POA: Insufficient documentation

## 2012-10-29 HISTORY — DX: Chronic viral hepatitis C: B18.2

## 2012-10-29 HISTORY — DX: Presence of artificial hip joint, bilateral: Z96.643

## 2012-10-29 HISTORY — DX: Personal history of diseases of the blood and blood-forming organs and certain disorders involving the immune mechanism: Z86.2

## 2012-10-29 HISTORY — DX: Other allergic rhinitis: J30.89

## 2012-10-29 NOTE — Progress Notes (Signed)
Subjective:    Patient ID: Tara [name], female    DOB: Apr 02, 1956, 57 y.o.   MRN: [id number]  HPI 57 year old female presented to the office for the first time today. She found a mass in her left breast while residing in [place] in 2012.. Mammogram showed suspicious abnormality in the left breast measuring about 10 mm. Physical exam showed a 1 cm firm superficial mass in the axillary region of the breast. Biopsy was performed 06/25/2011 at [facility]. She had invasive ductal carcinoma, grade 3, estrogen receptor positive, progesterone receptor positive. Tumor is HER2 positive by fish with a ratio of 2.5. She subsequently had definitive left lumpectomy and sentinel node sampling on 08/03/2011. 2 sentinel nodes were negative for tumor. Decision was made to proceed with 4 cycles of adjuvant chemotherapy consisting of docetaxel and cyclophosphamide given with trastuzumab on every 3 weeks with Neulasta on day 2 for granulocytes support. Trastuzumab  to be continued for one year.  Chemotherapy was started here in [facility] January 2013. She developed cellulitis in left breast. She was started on Keflex and seen by Dr. Cheral Bay. She had fluid drained from the cerumen. Has done well with no significant peripheral neuropathy. Was treated with radiation as well. She is on anastrozole as an antiestrogen.    Subsequently moved to [place] from [place] in the fall of 2012. She is now being followed by Dr. Darnelle Catalan. 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 Husband is a Quarry manager. Has never smoked. Drinks a glass of wine twice  weekly.  Family history: Father died at age 57 from emphysema with history of tobacco use. Mother living. One brother and one sister living. No history of breast or ovarian cancer in the immediate family.  Past medical history: Patient had TTP diagnosed at age 55 treated with bed rest and and paresis. No splenectomy. She had a recurrence at age 58  while being treated with interferon for hepatitis C.  History right hip replacement 2005. Left hip replacement 2007. Left foot reconstruction 2008. Fractured left elbow with pins placed.  History of asthma  History of seasonal allergic rhinitis.  History of hepatitis C treated with interferon. Her graft history of hypothyroidism.  Patient had tetanus immunization 01/06/2003. Pneumovax immunization 06/29/2001. Colonoscopy done 10/16/2009.  History of left arm lymphedema    Review of Systems  HENT:       EENT ears.  Musculoskeletal:       And cramps in arms. Leg swelling.  Skin:       Bruises easily.       Objective:   Physical Exam  Vitals reviewed. Constitutional: She is oriented to person, place, and time. She appears well-developed and well-nourished. No distress.  HENT:  Head: Normocephalic and atraumatic.  Right Ear: External ear normal.  Left Ear: External ear normal.  Mouth/Throat: Oropharynx is clear and moist.  Eyes: Conjunctivae and EOM are normal. Right eye exhibits no discharge. Left eye exhibits no discharge.  Neck: Neck supple. No JVD present. No thyromegaly present.  Cardiovascular: Normal rate, regular rhythm and normal heart sounds.   Pulmonary/Chest: No respiratory distress. She has no wheezes. She has no rales. She exhibits no tenderness.  Left lumpectomy  Abdominal: Bowel sounds are normal. She exhibits no distension and no mass. There is no tenderness. There is no rebound and no guarding.  Musculoskeletal:  Left arm lymphedema  Neurological: She is alert and oriented to person, place, and time. She has normal reflexes.  No cranial nerve deficit.  Skin: Skin is warm and dry. She is not diaphoretic.  Psychiatric: She has a normal mood and affect. Her behavior is normal. Thought content normal.          Assessment & Plan:  Hypothyroidism  Status post bilateral hip replacements  Asthma  Allergic rhinitis  History of left breast  cancer  History of left arm lymphedema  History of hepatitis C  History of TTP  Plan: Return one year or as needed. Continue treatment per Dr. Darnelle Catalan. Influenza immunization given.

## 2012-10-29 NOTE — Patient Instructions (Addendum)
Continue same medications and return in one year. 

## 2012-11-30 ENCOUNTER — Other Ambulatory Visit: Payer: 59 | Admitting: Lab

## 2012-11-30 ENCOUNTER — Telehealth: Payer: Self-pay | Admitting: *Deleted

## 2012-11-30 ENCOUNTER — Other Ambulatory Visit: Payer: Self-pay

## 2012-11-30 ENCOUNTER — Ambulatory Visit: Payer: 59 | Admitting: Physician Assistant

## 2012-11-30 DIAGNOSIS — E039 Hypothyroidism, unspecified: Secondary | ICD-10-CM

## 2012-11-30 MED ORDER — LEVOTHYROXINE SODIUM 50 MCG PO TABS: ORAL_TABLET | ORAL | Status: DC

## 2012-11-30 NOTE — Telephone Encounter (Signed)
Called pt to let her know that Amy is sick and cannot see her today.  She understood and I confirmed rescheduled 12/05/12 appt w/ pt.

## 2012-12-05 ENCOUNTER — Ambulatory Visit (INDEPENDENT_AMBULATORY_CARE_PROVIDER_SITE_OTHER): Payer: BC Managed Care – PPO | Admitting: General Surgery

## 2012-12-05 ENCOUNTER — Encounter (INDEPENDENT_AMBULATORY_CARE_PROVIDER_SITE_OTHER): Payer: Self-pay | Admitting: General Surgery

## 2012-12-05 ENCOUNTER — Encounter: Payer: Self-pay | Admitting: Physician Assistant

## 2012-12-05 ENCOUNTER — Other Ambulatory Visit (HOSPITAL_BASED_OUTPATIENT_CLINIC_OR_DEPARTMENT_OTHER): Payer: BC Managed Care – PPO | Admitting: Lab

## 2012-12-05 ENCOUNTER — Telehealth: Payer: Self-pay | Admitting: *Deleted

## 2012-12-05 ENCOUNTER — Ambulatory Visit (HOSPITAL_BASED_OUTPATIENT_CLINIC_OR_DEPARTMENT_OTHER): Payer: BC Managed Care – PPO | Admitting: Physician Assistant

## 2012-12-05 VITALS — BP 115/72 | HR 73 | Temp 97.7°F | Resp 20 | Ht 67.0 in | Wt 250.3 lb

## 2012-12-05 VITALS — BP 122/74 | HR 76 | Temp 100.6°F | Resp 18 | Ht 67.0 in | Wt 250.6 lb

## 2012-12-05 DIAGNOSIS — C50419 Malignant neoplasm of upper-outer quadrant of unspecified female breast: Secondary | ICD-10-CM

## 2012-12-05 DIAGNOSIS — R5381 Other malaise: Secondary | ICD-10-CM

## 2012-12-05 DIAGNOSIS — C50919 Malignant neoplasm of unspecified site of unspecified female breast: Secondary | ICD-10-CM

## 2012-12-05 DIAGNOSIS — Z170 Estrogen receptor positive status [ER+]: Secondary | ICD-10-CM

## 2012-12-05 DIAGNOSIS — Z853 Personal history of malignant neoplasm of breast: Secondary | ICD-10-CM

## 2012-12-05 DIAGNOSIS — C50912 Malignant neoplasm of unspecified site of left female breast: Secondary | ICD-10-CM

## 2012-12-05 DIAGNOSIS — R5383 Other fatigue: Secondary | ICD-10-CM

## 2012-12-05 LAB — COMPREHENSIVE METABOLIC PANEL (CC13)
ALT: 34 U/L (ref 0–55)
AST: 31 U/L (ref 5–34)
Albumin: 3.2 g/dL — ABNORMAL LOW (ref 3.5–5.0)
Alkaline Phosphatase: 61 U/L (ref 40–150)
BUN: 8.8 mg/dL (ref 7.0–26.0)
CO2: 22 mEq/L (ref 22–29)
Calcium: 8.5 mg/dL (ref 8.4–10.4)
Chloride: 108 mEq/L — ABNORMAL HIGH (ref 98–107)
Creatinine: 0.8 mg/dL (ref 0.6–1.1)
Glucose: 112 mg/dl — ABNORMAL HIGH (ref 70–99)
Potassium: 4.4 mEq/L (ref 3.5–5.1)
Sodium: 141 mEq/L (ref 136–145)
Total Bilirubin: 0.68 mg/dL (ref 0.20–1.20)
Total Protein: 6.7 g/dL (ref 6.4–8.3)

## 2012-12-05 LAB — CBC WITH DIFFERENTIAL/PLATELET
BASO%: 0.4 % (ref 0.0–2.0)
Basophils Absolute: 0 10*3/uL (ref 0.0–0.1)
EOS%: 6.2 % (ref 0.0–7.0)
Eosinophils Absolute: 0.3 10*3/uL (ref 0.0–0.5)
HCT: 36.4 % (ref 34.8–46.6)
HGB: 12.3 g/dL (ref 11.6–15.9)
LYMPH%: 10.3 % — ABNORMAL LOW (ref 14.0–49.7)
MCH: 32.1 pg (ref 25.1–34.0)
MCHC: 33.7 g/dL (ref 31.5–36.0)
MCV: 95.1 fL (ref 79.5–101.0)
MONO#: 0.5 10*3/uL (ref 0.1–0.9)
MONO%: 9.8 % (ref 0.0–14.0)
NEUT#: 3.6 10*3/uL (ref 1.5–6.5)
NEUT%: 73.3 % (ref 38.4–76.8)
Platelets: 138 10*3/uL — ABNORMAL LOW (ref 145–400)
RBC: 3.83 10*6/uL (ref 3.70–5.45)
RDW: 14 % (ref 11.2–14.5)
WBC: 4.8 10*3/uL (ref 3.9–10.3)
lymph#: 0.5 10*3/uL — ABNORMAL LOW (ref 0.9–3.3)

## 2012-12-05 MED ORDER — LETROZOLE 2.5 MG PO TABS: 2.5000 mg | ORAL_TABLET | Freq: Every day | ORAL | Status: DC

## 2012-12-05 NOTE — Telephone Encounter (Signed)
appts made and printed 

## 2012-12-05 NOTE — Patient Instructions (Signed)
Continue letrozole Continue regular self exams 

## 2012-12-05 NOTE — Progress Notes (Signed)
ID: Anneta Rounds   DOB: 1956/04/13  MR#: [id number]  JYN#:[id number]  HISTORY OF PRESENT ILLNESS: Tekeya noted a mass in her left breast and brought it to her physician's attention in [place], where she was residing. She was set up for mammography 06/24/2011 at [facility] in [place], [place]. This showed a suspicious abnormality in the left breast measuring approximately 10 mm. Physical examination showed a 1 cm firm superficial mass in the axillary region of the breast and ultrasound showed this to be 11 mm and spiculated.  Biopsy was performed June 25, 2011, and showed ([id number] at the [facility]) and invasive ductal carcinoma, grade 3, estrogen receptor 95% positive, progesterone receptor 40% positive, with an MIB-1 of 29%. The tumor is HER 2 positive by FISH with a ratio of 2.5. There is tumor heterogeneity noted.  With this information the case was presented Jul 08, 2011 at the multidisciplinary  breast cancer clinic. She proceeded to definitive left lumpectomy and sentinel lymph node sampling 08/03/2011. This confirmed a 1.6 cm invasive ductal carcinoma, grade 3. Margins were ample. The two sentinel lymph nodes were clear.  Her subsequent history is as detailed below.  INTERVAL HISTORY: Denora returns today with her husband Tim for followup of her left breast carcinoma. Jorja Loa is ten-days status post surgery for prostate cancer and is doing well.   Interval history is otherwise remarkable for [facility] continuing on letrozole with good tolerance. She completed her year of trastuzumab. Her final echocardiogram on 10/07/2011 showed a well preserved ejection fraction of 55-60%. She had her port removed under the care of Dr. Carolynne Edouard with no complications.    REVIEW OF SYSTEMS: Theone's only complaint today is continued fatigue. This is gradually improving. She continues to exercise regularly and is participating in the [employer] program. She's had no recent illnesses and denies  any fevers or chills. She does have occasional hot flashes, but nothing particularly problematic. She's had no skin changes or rashes. She denies abnormal bleeding. She's eating and drinking well with no nausea or change in bowel or bladder habits. She's had no cough, increased shortness of breath, or chest pain. No abnormal headaches, increased joint pain, myalgias or bony pain. She denies any peripheral swelling.  A detailed review of systems is otherwise noncontributory.   PAST MEDICAL HISTORY: Past Medical History  Diagnosis Date  . Thrombocytopenia, primary   . Asthma   . Arthritis   . Complication of anesthesia     difficulty waking up/dizzy/lightheaded  . Environmental allergies   . Shortness of breath   . Hypothyroidism   . Blood transfusion   . Anxiety   . Dysrhythmia     irregular heartbeat, takes digoxin  . Chronic edema   . Breast cancer 2012    left breast  . Hx of radiation therapy 12/15/11 - 01/29/12    left breast  . H/O varicella   . History of measles, mumps, or rubella   . TTP (thrombotic thrombocytopenic purpura)   . Hepatitis C   . H/O varicose veins    PAST SURGICAL HISTORY: Past Surgical History  Procedure Laterality Date  . Elbow pins      left  . Right hip replacement    . Left foot reconstruction    . Ortho left foot surg     . Joint replacement      bilat hip  . Portacath placement  08/03/2011    Procedure: INSERTION PORT-A-CATH;  Surgeon: Robyne Askew, MD;  Location:  [facility] OR;  Service: General;  Laterality: Right;  . Breast lumpectomy  08/03/11    left lumpectomy and slnbx,T1cN0,triple pos    FAMILY HISTORY Family History  Problem Relation Age of Onset  . Cancer Maternal Grandfather     brain  . Alcohol abuse Maternal Grandfather   . Heart disease Neg Hx   . Hyperlipidemia Neg Hx   . Hypertension Neg Hx   Patient's father died at the age of 45 from emphysema in the setting of tobacco abuse; patient's mother is alive. The patient has one  brother and one sister; there is no history of breast or ovarian cancer in the immediate family   Gynecologic history: She is GX P0 menarche age 39 most recent period July of 2008 she never took hormone replacement   Social History: the patient is a homemaker; she used to work in an office previously. Her husband [name] is a Magazine features editor. They moved here from [facility] Oct 2012. Her dog's name is Glass blower/designer (a Marine scientist)   ADVANCED DIRECTIVES:  HEALTH MAINTENANCE: History  Substance Use Topics  . Smoking status: Never Smoker   . Smokeless tobacco: Never Used  . Alcohol Use: 0.6 oz/week    1 Glasses of wine per week     Colonoscopy:  PAP: Oct 2012  Bone density: Nov 2011/ nl  Lipid panel:  Allergies  Allergen Reactions  . Aspirin     Hx of TTP  . Nsaids     Hx TTP    Current Outpatient Prescriptions  Medication Sig Dispense Refill  . acetaminophen (TYLENOL) 500 MG tablet Take 500 mg by mouth every 6 (six) hours as needed. For pain       . albuterol (PROVENTIL HFA;VENTOLIN HFA) 108 (90 BASE) MCG/ACT inhaler Inhale 2 puffs into the lungs every 6 (six) hours as needed. For shortness of breath       . cholecalciferol (VITAMIN D) 1000 UNITS tablet Take 2,000 Units by mouth daily.        . citalopram (CELEXA) 20 MG tablet TAKE 1 TABLET BY MOUTH EVERY DAILY  90 tablet  1  . digoxin (LANOXIN) 0.25 MG tablet Take 1 tablet (0.25 mg total) by mouth daily.  90 tablet  1  . fluticasone (FLONASE) 50 MCG/ACT nasal spray Place 2 sprays into the nose daily.  1 g  0  . glucosamine-chondroitin 500-400 MG tablet Take 1 tablet by mouth daily.       Marland Kitchen letrozole (FEMARA) 2.5 MG tablet Take 1 tablet (2.5 mg total) by mouth daily.  90 tablet  3  . levothyroxine (SYNTHROID, LEVOTHROID) 50 MCG tablet TAKE 1 TABLET BY MOUTH EVERY DAY  30 tablet  5  . loperamide (IMODIUM A-D) 2 MG tablet Take 2 mg by mouth 4 (four) times daily as needed. For diarrhea       . Loratadine (CLARITIN) 10 MG CAPS Take by  mouth as needed.      . montelukast (SINGULAIR) 10 MG tablet TAKE 1 TABLET BY MOUTH EVERY EVENING  90 tablet  0  . nystatin (MYCOSTATIN) powder Apply topically 2 (two) times daily.  15 g  1  . OVER THE COUNTER MEDICATION Take 1 tablet by mouth daily as needed. For gas pain      . pimecrolimus (ELIDEL) 1 % cream Apply 1 application topically 2 (two) times daily as needed. For exczema       . PULMICORT FLEXHALER 180 MCG/ACT inhaler INHALE 2 PUFFS BY MOUTH EVERY MORNING  3 Inhaler  0  . vitamin B-12 (CYANOCOBALAMIN) 100 MCG tablet Take 2,000 mcg by mouth daily.       . Wound Cleansers (RADIAPLEX EX) Apply topically.       No current facility-administered medications for this visit.    OBJECTIVE: Middle-aged white woman in no acute distress Filed Vitals:   12/05/12 1006  BP: 115/72  Pulse: 73  Temp: 97.7 F (36.5 C)  Resp: 20     Body mass index is 39.19 kg/(m^2).    ECOG FS: 0 Filed Weights   12/05/12 1006  Weight: 250 lb 4.8 oz (113.535 kg)    Sclerae unicteric Oropharynx clear No cervical or supraclavicular adenopathy Lungs clear to auscultation bilaterally with no rales or rhonchi Heart regular rate and rhythm Abdomen obese, soft, nontender with positive bowel sounds MSK no focal spinal tenderness, no peripheral edema Neuro: nonfocal, well oriented with positive affect Breasts: The right breast is unremarkable; the left breast is status post lumpectomy and radiation; there is no evidence of local recurrence. Axillae are benign bilaterally with no palpable adenopathy.     LAB RESULTS: Lab Results  Component Value Date   WBC 4.8 12/05/2012   NEUTROABS 3.6 12/05/2012   HGB 12.3 12/05/2012   HCT 36.4 12/05/2012   MCV 95.1 12/05/2012   PLT 138* 12/05/2012      Chemistry      Component Value Date/Time   NA 141 12/05/2012 0953   NA 140 05/30/2012 0905   K 4.4 12/05/2012 0953   K 4.2 05/30/2012 0905   CL 108* 12/05/2012 0953   CL 102 05/30/2012 0905   CO2 22 12/05/2012 0953   CO2 26  05/30/2012 0905   BUN 8.8 12/05/2012 0953   BUN 9 05/30/2012 0905   CREATININE 0.8 12/05/2012 0953   CREATININE 0.72 05/30/2012 0905   CREATININE 0.70 04/18/2012 1005      Component Value Date/Time   CALCIUM 8.5 12/05/2012 0953   CALCIUM 8.7 05/30/2012 0905   ALKPHOS 61 12/05/2012 0953   ALKPHOS 70 05/30/2012 0905   AST 31 12/05/2012 0953   AST 31 05/30/2012 0905   ALT 34 12/05/2012 0953   ALT 32 05/30/2012 0905   BILITOT 0.68 12/05/2012 0953   BILITOT 0.8 05/30/2012 0905       Lab Results  Component Value Date   LABCA2 30 04/18/2012     STUDIES: 2-D echocardiogram on 10/06/2012 showed an ejection fraction of 55 - 60%.    ASSESSMENT: 57 year old [place] woman originally from [place]   1. Status post left lumpectomy and sentinel lymph node sampling 08/03/2011 for a T1c N0 (Stage IA) invasive ductal carcinoma, high-grade, triple positive.   2. treated in the adjuvant setting with 4 doses of docetaxel and cyclophosphamide completed 11/16/2011 given along with trastuzumab  3. Trastuzumab continued to total of one year (to December 2013)  4. Status post radiation therapy, completed late May of 2013.  5. began on anastrozole in early June 2013, 1 mg daily. Discontinued in September 2013 due to in tolerance (rash). Started on letrozole in October 2013.  7. Comorbidities include a history of remote thrombotic thrombocytopenic purpura and hepatitis C.    PLAN: Dotti appears to be doing quite well with regards to her breast cancer. She will continue on letrozole, and I have refilled this for her with a 90 day prescription. She'll see Dr. Carolynne Edouard later today as scheduled. Her last mammogram in November recommended "short interval followup" so we will repeat that in  May. She'll return to see [name] in August for routine followup with labs and physical exam.  Corrie Dandy voices understanding and agreement with this plan, and will call with any changes or problems.  Gerrit Rafalski    12/05/2012

## 2012-12-05 NOTE — Progress Notes (Signed)
Subjective:     Patient ID: Tara [name], female   DOB: 12-22-1955, 57 y.o.   MRN: [id number]  HPI The patient is a 57 year old white female who is one and a half years status post left breast lumpectomy and negative sentinel node biopsy for a T1 C. N0 left breast cancer. She was a triple positive. She finished Herceptin. She is now taking letrozole and tolerated it well. Her next mammogram will be due in May. She denies any breast pain or discharge from her nipple.  Review of Systems  Constitutional: Negative.   HENT: Negative.   Eyes: Negative.   Respiratory: Negative.   Cardiovascular: Negative.   Gastrointestinal: Negative.   Endocrine: Negative.   Genitourinary: Negative.   Musculoskeletal: Negative.   Skin: Negative.   Allergic/Immunologic: Negative.   Neurological: Negative.   Hematological: Negative.   Psychiatric/Behavioral: Negative.        Objective:   Physical Exam  Constitutional: She is oriented to person, place, and time. She appears well-developed and well-nourished.  HENT:  Head: Normocephalic and atraumatic.  Eyes: Conjunctivae and EOM are normal. Pupils are equal, round, and reactive to light.  Neck: Normal range of motion. Neck supple.  Cardiovascular: Normal rate, regular rhythm and normal heart sounds.   Pulmonary/Chest: Effort normal and breath sounds normal.  She has a little but of palpable fullness beneath the incision secondary to radiation. Otherwise there is no palpable mass in either breast. There is no palpable axillary supraclavicular cervical lymphadenopathy  Abdominal: Soft. Bowel sounds are normal. She exhibits no mass. There is no tenderness.  Musculoskeletal: Normal range of motion.  Lymphadenopathy:    She has no cervical adenopathy.  Neurological: She is alert and oriented to person, place, and time.  Skin: Skin is warm and dry.  Psychiatric: She has a normal mood and affect. Her behavior is normal.       Assessment:     The patient  is a 1-1/2 years status post left lumpectomy for breast cancer     Plan:     At this point she will continue to take letrozole. She will continue to do regular self exams. She will see the oncologist in 6 months. We will plan to see her back in one year.

## 2012-12-06 ENCOUNTER — Other Ambulatory Visit: Payer: 59 | Admitting: Internal Medicine

## 2012-12-08 ENCOUNTER — Ambulatory Visit (INDEPENDENT_AMBULATORY_CARE_PROVIDER_SITE_OTHER): Payer: Self-pay | Admitting: Internal Medicine

## 2012-12-08 ENCOUNTER — Encounter: Payer: Self-pay | Admitting: Internal Medicine

## 2012-12-08 VITALS — BP 126/80 | HR 76 | Wt 247.0 lb

## 2012-12-08 DIAGNOSIS — E039 Hypothyroidism, unspecified: Secondary | ICD-10-CM

## 2012-12-08 DIAGNOSIS — Z853 Personal history of malignant neoplasm of breast: Secondary | ICD-10-CM

## 2012-12-08 DIAGNOSIS — Z131 Encounter for screening for diabetes mellitus: Secondary | ICD-10-CM

## 2012-12-08 DIAGNOSIS — Z8619 Personal history of other infectious and parasitic diseases: Secondary | ICD-10-CM

## 2012-12-08 MED ORDER — BUDESONIDE 180 MCG/ACT IN AEPB: 2.0000 | INHALATION_SPRAY | Freq: Every day | RESPIRATORY_TRACT | Status: DC

## 2012-12-08 NOTE — Progress Notes (Signed)
  Subjective:    Patient ID: Tara [name], female    DOB: [birth year], 57 y.o.   MRN: [id number]  HPI 57 year old White female with history of breast cancer followed by Dr. Darnelle Catalan in today for 6 month recheck. Hx of hypothyroidism, venous insufficiency,asthma, allergic rhinitis,cardiac dysrhythmia treated with Digoxin, Hx Hepatitis C treated with Interferon. Recent c- met WNL and CBC normal except for chronically low platelet count 141k. No significant depenedent edema. Has compression stockings but has superificial varicosities. Asthma and allergic rhinitis are stable. TSH will be added to labs drawn earlier this week at [facility]. Remains on Femara. Needs refill on Pulmicort. Fasting glucose this week was 112. Need to add Hgb AIC. Weight is 247.5 lbs and was 246 lbs Oct 2013. Husband was dx with prostate cancer and has had robotic surgery  She reports he is  doing well.    Review of Systems     Objective:   Physical Exam neck is supple without thyromegaly. Chest clear to auscultation. Cardiac exam regular rate and rhythm normal S1 and S2. Extremities without edema.        Assessment & Plan:  Elevated fasting glucose Venous insufficiency Obesity History of breast cancer Asthma Allergic rhinitis Hypothyroidism on thyroid replacement therapy Remote hx of Hep C  Hx of TTP with chronically low platelet count S/p bilateral hip replacements.  Plan is to check TSH and HGB AIC and see Oct 2014 for PE and CPE labs. No change in meds today. Has had pneumovax 06/29/01  Addendum: TSH normal on thyroid replacement therapy. Hemoglobin A1c 5.5%

## 2012-12-23 ENCOUNTER — Other Ambulatory Visit: Payer: Self-pay | Admitting: Internal Medicine

## 2012-12-29 ENCOUNTER — Other Ambulatory Visit: Payer: Self-pay | Admitting: Internal Medicine

## 2012-12-29 LAB — HEMOGLOBIN A1C
Hgb A1c MFr Bld: 5.5 % (ref <5.7)
Mean Plasma Glucose: 111 mg/dL (ref <117)

## 2012-12-29 LAB — TSH: TSH: 2.338 u[IU]/mL (ref 0.350–4.500)

## 2012-12-31 ENCOUNTER — Encounter: Payer: Self-pay | Admitting: Internal Medicine

## 2012-12-31 NOTE — Patient Instructions (Addendum)
Continue same medications and return for physical exam in 6 months 

## 2013-01-31 ENCOUNTER — Encounter: Payer: Self-pay | Admitting: Internal Medicine

## 2013-02-01 ENCOUNTER — Encounter: Payer: Self-pay | Admitting: Oncology

## 2013-02-24 ENCOUNTER — Encounter: Payer: Self-pay | Admitting: *Deleted

## 2013-04-04 ENCOUNTER — Ambulatory Visit (INDEPENDENT_AMBULATORY_CARE_PROVIDER_SITE_OTHER): Payer: BC Managed Care – PPO

## 2013-04-04 ENCOUNTER — Encounter: Payer: Self-pay | Admitting: Internal Medicine

## 2013-04-04 ENCOUNTER — Ambulatory Visit (INDEPENDENT_AMBULATORY_CARE_PROVIDER_SITE_OTHER): Payer: BC Managed Care – PPO | Admitting: Internal Medicine

## 2013-04-04 DIAGNOSIS — B1920 Unspecified viral hepatitis C without hepatic coma: Secondary | ICD-10-CM

## 2013-04-04 DIAGNOSIS — R197 Diarrhea, unspecified: Secondary | ICD-10-CM

## 2013-04-04 LAB — SEDIMENTATION RATE: Sed Rate: 34 mm/hr — ABNORMAL HIGH (ref 0–22)

## 2013-04-04 MED ORDER — ESOMEPRAZOLE MAGNESIUM 40 MG PO CPDR: 40.0000 mg | DELAYED_RELEASE_CAPSULE | Freq: Every day | ORAL | Status: DC

## 2013-04-04 NOTE — Patient Instructions (Addendum)
You have been scheduled for an abdominal ultrasound at [facility] (1st floor of hospital) on 04/05/13 at 8:00 am. Please arrive 15 minutes prior to your appointment for registration. Make certain not to have anything to eat or drink 6 hours prior to your appointment. Should you need to reschedule your appointment, please contact radiology at [phone]. This test typically takes about 30 minutes to perform.  Your physician has requested that you go to the basement for the following lab work before leaving today: Stool culture, lactoferrin, C Diff by PCR, sed rate, Hepatitis C RNA PCR Quantitative  We have sent the following medications to your pharmacy for you to pick up at your convenience: Nexium Bentyl 20 mg po bid

## 2013-04-04 NOTE — Progress Notes (Signed)
Tara [name] 12/16/1955 MRN [id number]  History of Present Illness:  This is a 57 year old white female with diarrhea, urgency and occasional  fecal incontinence. This has been occurring for less than a year. She denies constipation. She had a colonoscopy in 10/16/2009 in [place] and had one polyp We don't have that report.. She denies history of colitis or family history of inflammatory bowel disease. She denies rectal bleeding or abdominal pain. She has belching and has had  flatulence. Patient completed radiation and chemotherapy for breast cancer about a year ago. The initial diagnosis was made in October 2012. She moved to [place] from [place]. She had an upper endoscopy there and dilation of an esophageal stricture approximately 2 years ago. She is not taking any acid reducing agents. She was treated for hepatitis C at age 62 but developed TTP and never finished interferon therapy (she completed 4 months of it). She had the initial episode of TTP at age 53. She is not sure of the status of hepatitis C at this point but she has had minor abnormalities of her liver function tests. She had both hips replaced and surgery on her left ankle. She is currently unemployed and her husband is also unemployed and there has been a lot of stress because they may have to move back to [place].   Past Medical History  Diagnosis Date  . Thrombocytopenia, primary   . Asthma   . Arthritis   . Complication of anesthesia     difficulty waking up/dizzy/lightheaded  . Environmental allergies   . Shortness of breath   . Hypothyroidism   . Blood transfusion   . Anxiety   . Dysrhythmia     irregular heartbeat, takes digoxin  . Chronic edema   . Breast cancer 2012    left breast  . Hx of radiation therapy 12/15/11 - 01/29/12    left breast  . H/O varicella   . History of measles, mumps, or rubella   . TTP (thrombotic thrombocytopenic purpura)   . Hepatitis C   . H/O varicose veins    Past Surgical History   Procedure Laterality Date  . Elbow pins Left   . Total hip arthroplasty Bilateral   . Foot surgery Left     multiple  . Portacath placement  08/03/2011    Procedure: INSERTION PORT-A-CATH;  Surgeon: Robyne Askew, MD;  Location: [facility] OR;  Service: General;  Laterality: Right;  . Breast lumpectomy  08/03/11    left lumpectomy and slnbx,T1cN0,triple pos    reports that she has never smoked. She has never used smokeless tobacco. She reports that she drinks about 0.6 ounces of alcohol per week. She reports that she does not use illicit drugs. family history includes Alcohol abuse in her maternal grandfather and Brain cancer in her maternal grandfather.  There is no history of Heart disease, and Hyperlipidemia, and Hypertension, . Allergies  Allergen Reactions  . Aspirin     Hx of TTP  . Nsaids     Hx TTP        Review of Systems: Denies dysphagia or heartburn  The remainder of the 10 point ROS is negative except as outlined in H&P   Physical Exam: General appearance  Well developed, in no distress. Overweight Eyes- non icteric. HEENT nontraumatic, normocephalic. Mouth no lesions, tongue papillated, no cheilosis. Neck supple without adenopathy, thyroid not enlarged, no carotid bruits, no JVD. Lungs Clear to auscultation bilaterally. Cor normal S1, normal S2, regular rhythm, no murmur,  quiet precordium. Abdomen: Obese soft tender in epigastrium. Her edge at costal margin. No ascites lower abdomen unremarkable. Rectal: Normal rectal sphincter tone. Soft Hemoccult-negative stool. Extremities massive lymphedema of both lower extremities pedal edema. Skin no lesions. Neurological alert and oriented x 3. Psychological normal mood and affect.  Assessment and Plan:  Problem #72 57 year old white female with urgent bowel movements with some diarrhea and increased frequency suggestive of irritable bowel syndrome. Low-grade inflammatory bowel disease or bacterial overgrowth diarrhea are  also a possibility. He will obtain stool for lactoferrin, C. difficile and cultures. We will start Bentyl 20 mg twice a day and samples of a probiotic to take once daily to improve her bacterial flora. Depending on the results, she may need a flexible sigmoidoscopy and biopsies to rule out microscopic colitis.  Problem #2 Dyspepsia, bloating and history of esophageal stricture. Samples of Prilosec have been given as well as a prescription for Nexium 40 mg daily, we will proceed with an upper abdominal ultrasound to rule out symptomatic gallbladder disease.  Problem #3 History of hepatitis C. We will check her hepatitis C RNA by PCR as well as sedimentation rate and upper abdominal ultrasound.  Problem #4 Thrombocytopenia. Patient has a borderline low platelet count. An ultrasound will help [name] to assess her splenic size.    04/04/2013 Lina Sar

## 2013-04-05 ENCOUNTER — Other Ambulatory Visit: Payer: BC Managed Care – PPO

## 2013-04-05 ENCOUNTER — Other Ambulatory Visit: Payer: Self-pay

## 2013-04-05 DIAGNOSIS — R197 Diarrhea, unspecified: Secondary | ICD-10-CM

## 2013-04-05 MED ORDER — DICYCLOMINE HCL 20 MG PO TABS: 20.0000 mg | ORAL_TABLET | Freq: Two times a day (BID) | ORAL | Status: DC

## 2013-04-05 NOTE — Addendum Note (Signed)
Addended by: Richardson Chiquito on: 04/05/2013 08:34 AM   Modules accepted: Orders

## 2013-04-06 ENCOUNTER — Ambulatory Visit (HOSPITAL_COMMUNITY)
Admission: RE | Admit: 2013-04-06 | Discharge: 2013-04-06 | Disposition: A | Discharge status: Home / Self Care | Payer: BC Managed Care – PPO | Source: Ambulatory Visit | Attending: Internal Medicine | Admitting: Internal Medicine

## 2013-04-06 DIAGNOSIS — B1920 Unspecified viral hepatitis C without hepatic coma: Secondary | ICD-10-CM | POA: Insufficient documentation

## 2013-04-06 LAB — HCV RNA NAA QUAL RFX TO QUANT

## 2013-04-06 LAB — FECAL LACTOFERRIN, QUANT: Lactoferrin: NEGATIVE

## 2013-04-06 LAB — CLOSTRIDIUM DIFFICILE BY PCR: Toxigenic C. Difficile by PCR: NOT DETECTED

## 2013-04-07 ENCOUNTER — Other Ambulatory Visit: Payer: Self-pay | Admitting: *Deleted

## 2013-04-07 DIAGNOSIS — R197 Diarrhea, unspecified: Secondary | ICD-10-CM

## 2013-04-07 DIAGNOSIS — B182 Chronic viral hepatitis C: Secondary | ICD-10-CM

## 2013-04-09 LAB — STOOL CULTURE

## 2013-04-11 ENCOUNTER — Encounter: Payer: Self-pay | Admitting: Internal Medicine

## 2013-04-12 ENCOUNTER — Other Ambulatory Visit: Payer: BC Managed Care – PPO

## 2013-04-12 DIAGNOSIS — R197 Diarrhea, unspecified: Secondary | ICD-10-CM

## 2013-04-12 DIAGNOSIS — B182 Chronic viral hepatitis C: Secondary | ICD-10-CM

## 2013-04-13 LAB — HEPATITIS C RNA QUANTITATIVE
HCV Quantitative Log: 5.57 {Log} — ABNORMAL HIGH (ref <1.18)
HCV Quantitative: 374472 IU/mL — ABNORMAL HIGH (ref <15)

## 2013-04-14 ENCOUNTER — Telehealth: Payer: Self-pay | Admitting: Internal Medicine

## 2013-04-14 NOTE — Telephone Encounter (Signed)
See results note. 

## 2013-04-25 ENCOUNTER — Encounter: Payer: Self-pay | Admitting: Internal Medicine

## 2013-04-25 ENCOUNTER — Telehealth: Payer: Self-pay | Admitting: *Deleted

## 2013-04-25 ENCOUNTER — Other Ambulatory Visit: Payer: BC Managed Care – PPO

## 2013-04-25 ENCOUNTER — Ambulatory Visit (INDEPENDENT_AMBULATORY_CARE_PROVIDER_SITE_OTHER): Payer: BC Managed Care – PPO | Admitting: Internal Medicine

## 2013-04-25 VITALS — BP 100/72 | HR 64 | Ht 67.0 in | Wt 241.4 lb

## 2013-04-25 DIAGNOSIS — Z23 Encounter for immunization: Secondary | ICD-10-CM

## 2013-04-25 DIAGNOSIS — B1920 Unspecified viral hepatitis C without hepatic coma: Secondary | ICD-10-CM

## 2013-04-25 DIAGNOSIS — R197 Diarrhea, unspecified: Secondary | ICD-10-CM

## 2013-04-25 LAB — HEPATITIS B SURFACE ANTIBODY,QUALITATIVE: Hep B S Ab: NEGATIVE

## 2013-04-25 LAB — HEPATITIS B SURFACE ANTIGEN: Hepatitis B Surface Ag: NEGATIVE

## 2013-04-25 MED ORDER — COLESEVELAM HCL 625 MG PO TABS: 1250.0000 mg | ORAL_TABLET | Freq: Every day | ORAL | Status: DC

## 2013-04-25 MED ORDER — PNEUMOCOCCAL VAC POLYVALENT 25 MCG/0.5ML IJ INJ: 0.5000 mL | INJECTION | Freq: Once | INTRAMUSCULAR | Status: DC

## 2013-04-25 MED ORDER — HYOSCYAMINE SULFATE ER 0.375 MG PO TB12: 0.3750 mg | ORAL_TABLET | Freq: Two times a day (BID) | ORAL | Status: DC

## 2013-04-25 NOTE — Progress Notes (Signed)
Tara [name] 1955/09/30 MRN [id number]  History of Present Illness:  This is a 57 year old white female with diarrhea predominant irritable bowel syndrome . We saw her 04/04/2013 and started her on Bentyl 20 mg in the morning. She has not had any improvement of her diarrhea. She denies having any formed stools. She denies nocturnal diarrhea. We have received records from [place] where she lived previously. She had a colonoscopy there in 2011 and there was no evidence of colitis on that exam. She was recently treated for breast cancer with radiation and chemotherapy. There  Is a history of TTP, which was found during treatment for hepatitis C with interferon at age 62. Her upper abdominal ultrasound on 04/04/2013 was essentially normal. Her platelet count is low at 138,000. Her sedimentation rate is mildly elevated at 34 and her hepatitis C RNA by PCR was 374,000.   Past Medical History  Diagnosis Date  . Thrombocytopenia, primary   . Asthma   . Arthritis   . Complication of anesthesia     difficulty waking up/dizzy/lightheaded  . Environmental allergies   . Shortness of breath   . Hypothyroidism   . Blood transfusion   . Anxiety   . Dysrhythmia     irregular heartbeat, takes digoxin  . Chronic edema   . Breast cancer 2012    left breast  . Hx of radiation therapy 12/15/11 - 01/29/12    left breast  . H/O varicella   . History of measles, mumps, or rubella   . TTP (thrombotic thrombocytopenic purpura)   . Hepatitis C   . H/O varicose veins    Past Surgical History  Procedure Laterality Date  . Elbow pins Left   . Total hip arthroplasty Bilateral   . Foot surgery Left     multiple  . Portacath placement  08/03/2011    Procedure: INSERTION PORT-A-CATH;  Surgeon: Robyne Askew, MD;  Location: [facility] OR;  Service: General;  Laterality: Right;  . Breast lumpectomy  08/03/11    left lumpectomy and slnbx,T1cN0,triple pos    reports that she has never smoked. She has never used smokeless  tobacco. She reports that she drinks about 0.6 ounces of alcohol per week. She reports that she does not use illicit drugs. family history includes Alcohol abuse in her maternal grandfather; Brain cancer in her maternal grandfather. There is no history of Heart disease, Hyperlipidemia, or Hypertension. Allergies  Allergen Reactions  . Aspirin     Hx of TTP  . Nsaids     Hx TTP        Review of Systems: Positive for belching and indigestion. Negative for rectal bleeding  The remainder of the 10 point ROS is negative except as outlined in H&P   Physical Exam: General appearance  Well developed, in no distress. Overweight Eyes- non icteric. HEENT nontraumatic, normocephalic. Mouth no lesions, tongue papillated, no cheilosis. Neck supple without adenopathy, thyroid not enlarged, no carotid bruits, no JVD. Lungs Clear to auscultation bilaterally. Cor normal S1, normal S2, regular rhythm, no murmur,  quiet precordium. Abdomen: Mild tenderness left and right lower quadrant. Normal active bowel sounds. No distention. No tympany. Liver edge at costal margin. Rectal: Not repeated. Extremities no pedal edema. Skin no lesions. Neurological alert and oriented x 3. Psychological normal mood and affect.  Assessment and Plan:  Problem #1 Chronic diarrhea not responding to IBS regimen. We will proceed with a flexible sigmoidoscopy and random biopsies to rule out microscopic colitis. We will add  Welchol 625 mg 2 tablets daily. We will also switch her to Levbid 0.375 mg twice a day. She may use Imodium when necessary.  Problem #2 Hepatitis C with viral load of 374.000. Patient does not wish to be treated at this time because of prior complications with thrombocytopenia. We may eventually send her to hepatitis clinic for consideration of an alternative treatment. Her liver synthetic function is normal and there are no signs of portal hypertension. We will check on her hepatitis B serologies today and  she will also receive a pneumonia vaccine today.     04/25/2013 Lina Sar

## 2013-04-25 NOTE — Patient Instructions (Addendum)
We have sent the following medications to your pharmacy for you to pick up at your convenience: Welchol Levbid (in place of dicyclomine)  You have been scheduled for a flexible sigmoidoscopy. Please follow the written instructions given to you at your visit today. If you use inhalers (even only as needed), please bring them with you on the day of your procedure.  We have given you a pneumococcal vaccine today. Some redness and swelling at the injection site is to be expected. However, should you have any symptoms concerning to you after this injection, please contact us at [phone].  Your physician has requested that you go to the basement for the following lab work before leaving today: Hepatitis A and B serologies  CC :Dr Judie Petit.[place]

## 2013-04-25 NOTE — Telephone Encounter (Signed)
Pt called to cancel her appts for 10/7 because she is scheduled @ another office. gv appt for 06/07/13 w/labs@ 10:15am and ov @ 10:45am. Pt is aware...td

## 2013-04-26 ENCOUNTER — Ambulatory Visit (AMBULATORY_SURGERY_CENTER): Payer: BC Managed Care – PPO | Admitting: Internal Medicine

## 2013-04-26 ENCOUNTER — Encounter: Payer: Self-pay | Admitting: Internal Medicine

## 2013-04-26 VITALS — BP 119/80 | HR 57 | Temp 97.1°F | Resp 33 | Ht 67.0 in | Wt 241.0 lb

## 2013-04-26 DIAGNOSIS — D126 Benign neoplasm of colon, unspecified: Secondary | ICD-10-CM

## 2013-04-26 DIAGNOSIS — R197 Diarrhea, unspecified: Secondary | ICD-10-CM

## 2013-04-26 LAB — HEPATITIS A ANTIBODY, TOTAL: Hep A Total Ab: NEGATIVE

## 2013-04-26 MED ORDER — SODIUM CHLORIDE 0.9 % IV SOLN: 500.0000 mL | INTRAVENOUS | Status: DC

## 2013-04-26 NOTE — Patient Instructions (Addendum)

## 2013-04-26 NOTE — Progress Notes (Signed)
Patient did not experience any of the following events: a burn prior to discharge; a fall within the facility; wrong site/side/patient/procedure/implant event; or a hospital transfer or hospital admission upon discharge from the facility. (G8907) Patient did not have preoperative order for IV antibiotic SSI prophylaxis. (G8918)  

## 2013-04-26 NOTE — Progress Notes (Signed)
Report to pacu rn, vss, bbs=clear 

## 2013-04-26 NOTE — Progress Notes (Signed)
Called to room to assist during endoscopic procedure.  Patient ID and intended procedure confirmed with present staff. Received instructions for my participation in the procedure from the performing physician.  

## 2013-04-26 NOTE — Op Note (Signed)
[facility] [address] [place], [address]   FLEXIBLE SIGMOIDOSCOPY PROCEDURE REPORT  PATIENT: Tara, [name]  MR#: [id number] BIRTHDATE: 12/30/1955 , 57  yrs. old GENDER: Female ENDOSCOPIST: Hart Carwin, MD REFERRED BY: Sharlet Salina, M.D. PROCEDURE DATE:  04/26/2013 PROCEDURE:   Sigmoidoscopy with biopsy ASA CLASS:   Class II INDICATIONS:chronic diarrhea. , stool studies, sprue profile negative,, colon 2007 in [place]- tub adenoma MEDICATIONS: MAC sedation, administered by CRNA and Propofol (Diprivan) 120 mg IV  DESCRIPTION OF PROCEDURE:   After the risks benefits and alternatives of the procedure were thoroughly explained, informed consent was obtained.  revealed no abnormalities of the rectum. The LB [id number]-[id number]  endoscope was introduced through the anus  and advanced to the descending colon , limited by No adverse events experienced.   The quality of the prep was good .  The instrument was then slowly withdrawn as the mucosa was fully examined.         COLON FINDINGS: The colonic mucosa appeared normal in the sigmoid colon.  Random biopsies of the left colon to 80 cm were taken to r/o microscopic colitis Retroflexed views revealed no abnormalities.    The scope was then withdrawn from the patient and the procedure terminated.  COMPLICATIONS: There were no complications.  ENDOSCOPIC IMPRESSION: The colonic mucosa appeared normal in the sigmoid colon , s/p random left colon biopsies  RECOMMENDATIONS: await biopsy results antidiarrheals continue meds  REPEAT EXAM: no recall, will need full colonoscopy in several years.   _______________________________ eSignedHart Carwin, MD 04/26/2013 12:41 PM   CC:

## 2013-04-27 ENCOUNTER — Encounter: Payer: Self-pay | Admitting: Internal Medicine

## 2013-04-27 ENCOUNTER — Telehealth: Payer: Self-pay | Admitting: *Deleted

## 2013-04-27 ENCOUNTER — Encounter: Payer: Self-pay | Admitting: Oncology

## 2013-04-27 NOTE — Telephone Encounter (Signed)
Called patient to let her know her email addressed to Dr Juanda Chance was send to [facility] Dr Darnelle Catalan. She will resend her questions to Dr Juanda Chance

## 2013-04-27 NOTE — Telephone Encounter (Signed)
  Follow up Call-  Call back number 04/26/2013  Post procedure Call Back phone  # [phone]  Permission to leave phone message Yes     Patient questions:  Do you have a fever, pain , or abdominal swelling? no Pain Score  0 *  Have you tolerated food without any problems? yes  Have you been able to return to your normal activities? yes  Do you have any questions about your discharge instructions: Diet   no Medications  no Follow up visit  no  Do you have questions or concerns about your Care? no  Actions: * If pain score is 4 or above: No action needed, pain <4.

## 2013-05-02 ENCOUNTER — Encounter: Payer: Self-pay | Admitting: Internal Medicine

## 2013-05-03 ENCOUNTER — Encounter: Payer: Self-pay | Admitting: Internal Medicine

## 2013-06-05 ENCOUNTER — Other Ambulatory Visit: Payer: BC Managed Care – PPO | Admitting: Internal Medicine

## 2013-06-05 DIAGNOSIS — Z130 Encounter for screening for diseases of the blood and blood-forming organs and certain disorders involving the immune mechanism: Secondary | ICD-10-CM

## 2013-06-05 DIAGNOSIS — E039 Hypothyroidism, unspecified: Secondary | ICD-10-CM

## 2013-06-05 DIAGNOSIS — Z13220 Encounter for screening for lipoid disorders: Secondary | ICD-10-CM

## 2013-06-05 DIAGNOSIS — Z0000 Encounter for general adult medical examination without abnormal findings: Secondary | ICD-10-CM

## 2013-06-05 LAB — CBC WITH DIFFERENTIAL/PLATELET
Basophils Absolute: 0 10*3/uL (ref 0.0–0.1)
Basophils Relative: 0 % (ref 0–1)
Eosinophils Absolute: 0.2 10*3/uL (ref 0.0–0.7)
Eosinophils Relative: 4 % (ref 0–5)
HCT: 38.4 % (ref 36.0–46.0)
Hemoglobin: 13.4 g/dL (ref 12.0–15.0)
Lymphocytes Relative: 14 % (ref 12–46)
Lymphs Abs: 0.7 10*3/uL (ref 0.7–4.0)
MCH: 32.8 pg (ref 26.0–34.0)
MCHC: 34.9 g/dL (ref 30.0–36.0)
MCV: 93.9 fL (ref 78.0–100.0)
Monocytes Absolute: 0.5 10*3/uL (ref 0.1–1.0)
Monocytes Relative: 10 % (ref 3–12)
Neutro Abs: 3.7 10*3/uL (ref 1.7–7.7)
Neutrophils Relative %: 72 % (ref 43–77)
Platelets: 149 10*3/uL — ABNORMAL LOW (ref 150–400)
RBC: 4.09 MIL/uL (ref 3.87–5.11)
RDW: 14.1 % (ref 11.5–15.5)
WBC: 5.1 10*3/uL (ref 4.0–10.5)

## 2013-06-05 LAB — COMPREHENSIVE METABOLIC PANEL
ALT: 26 U/L (ref 0–35)
AST: 28 U/L (ref 0–37)
Albumin: 4.1 g/dL (ref 3.5–5.2)
Alkaline Phosphatase: 65 U/L (ref 39–117)
BUN: 9 mg/dL (ref 6–23)
CO2: 28 mEq/L (ref 19–32)
Calcium: 8.9 mg/dL (ref 8.4–10.5)
Chloride: 102 mEq/L (ref 96–112)
Creat: 0.64 mg/dL (ref 0.50–1.10)
Glucose, Bld: 100 mg/dL — ABNORMAL HIGH (ref 70–99)
Potassium: 4 mEq/L (ref 3.5–5.3)
Sodium: 138 mEq/L (ref 135–145)
Total Bilirubin: 0.7 mg/dL (ref 0.3–1.2)
Total Protein: 6.8 g/dL (ref 6.0–8.3)

## 2013-06-05 LAB — LIPID PANEL
Cholesterol: 163 mg/dL (ref 0–200)
HDL: 41 mg/dL (ref >39)
LDL Cholesterol: 96 mg/dL (ref 0–99)
Total CHOL/HDL Ratio: 4 Ratio
Triglycerides: 132 mg/dL (ref <150)
VLDL: 26 mg/dL (ref 0–40)

## 2013-06-05 LAB — TSH: TSH: 3.608 u[IU]/mL (ref 0.350–4.500)

## 2013-06-06 ENCOUNTER — Other Ambulatory Visit: Payer: BC Managed Care – PPO | Admitting: Lab

## 2013-06-06 ENCOUNTER — Other Ambulatory Visit (HOSPITAL_COMMUNITY)
Admission: RE | Admit: 2013-06-06 | Discharge: 2013-06-06 | Disposition: A | Discharge status: Home / Self Care | Payer: BC Managed Care – PPO | Source: Ambulatory Visit | Attending: Internal Medicine | Admitting: Internal Medicine

## 2013-06-06 ENCOUNTER — Ambulatory Visit (INDEPENDENT_AMBULATORY_CARE_PROVIDER_SITE_OTHER): Payer: BC Managed Care – PPO | Admitting: Internal Medicine

## 2013-06-06 ENCOUNTER — Ambulatory Visit: Payer: BC Managed Care – PPO | Admitting: Physician Assistant

## 2013-06-06 ENCOUNTER — Encounter: Payer: Self-pay | Admitting: Internal Medicine

## 2013-06-06 VITALS — BP 106/74 | HR 88 | Temp 97.8°F | Ht 67.25 in | Wt 239.0 lb

## 2013-06-06 DIAGNOSIS — Z862 Personal history of diseases of the blood and blood-forming organs and certain disorders involving the immune mechanism: Secondary | ICD-10-CM

## 2013-06-06 DIAGNOSIS — Z8619 Personal history of other infectious and parasitic diseases: Secondary | ICD-10-CM

## 2013-06-06 DIAGNOSIS — J309 Allergic rhinitis, unspecified: Secondary | ICD-10-CM

## 2013-06-06 DIAGNOSIS — Z23 Encounter for immunization: Secondary | ICD-10-CM

## 2013-06-06 DIAGNOSIS — R609 Edema, unspecified: Secondary | ICD-10-CM

## 2013-06-06 DIAGNOSIS — Z0000 Encounter for general adult medical examination without abnormal findings: Secondary | ICD-10-CM

## 2013-06-06 DIAGNOSIS — Z01419 Encounter for gynecological examination (general) (routine) without abnormal findings: Secondary | ICD-10-CM | POA: Insufficient documentation

## 2013-06-06 DIAGNOSIS — Z8709 Personal history of other diseases of the respiratory system: Secondary | ICD-10-CM

## 2013-06-06 DIAGNOSIS — M549 Dorsalgia, unspecified: Secondary | ICD-10-CM

## 2013-06-06 DIAGNOSIS — Z853 Personal history of malignant neoplasm of breast: Secondary | ICD-10-CM

## 2013-06-06 DIAGNOSIS — Z8639 Personal history of other endocrine, nutritional and metabolic disease: Secondary | ICD-10-CM

## 2013-06-06 DIAGNOSIS — Z96643 Presence of artificial hip joint, bilateral: Secondary | ICD-10-CM

## 2013-06-06 DIAGNOSIS — Z96649 Presence of unspecified artificial hip joint: Secondary | ICD-10-CM

## 2013-06-06 LAB — POCT URINALYSIS DIPSTICK
Bilirubin, UA: NEGATIVE
Blood, UA: NEGATIVE
Glucose, UA: NEGATIVE
Ketones, UA: NEGATIVE
Leukocytes, UA: NEGATIVE
Nitrite, UA: NEGATIVE
Protein, UA: NEGATIVE
Spec Grav, UA: 1.01
Urobilinogen, UA: NEGATIVE
pH, UA: 7

## 2013-06-06 LAB — VITAMIN D 25 HYDROXY (VIT D DEFICIENCY, FRACTURES): Vit D, 25-Hydroxy: 47 ng/mL (ref 30–89)

## 2013-06-06 MED ORDER — TORSEMIDE 20 MG PO TABS: 20.0000 mg | ORAL_TABLET | Freq: Every day | ORAL | Status: DC

## 2013-06-06 MED ORDER — CYCLOBENZAPRINE HCL 10 MG PO TABS: 10.0000 mg | ORAL_TABLET | Freq: Three times a day (TID) | ORAL | Status: DC | PRN

## 2013-06-06 MED ORDER — LEVOTHYROXINE SODIUM 75 MCG PO TABS
75.0000 ug | ORAL_TABLET | Freq: Every day | ORAL | Status: DC
Start: 2013-06-06 — End: 2013-12-29

## 2013-06-06 MED ORDER — CITALOPRAM HYDROBROMIDE 10 MG PO TABS: ORAL_TABLET | ORAL | Status: DC

## 2013-06-06 NOTE — Patient Instructions (Addendum)
Decrease antidepresssant to one half  Tab daily x 4 weeks then every other day x 4 weeks then discontinue. Take Flexeril one half tab at bedtime. Take Demadex for leg swelling. Keep appts with oncologist and Dermatologist.  Return in 3 weeks. Also increase Synthroid to 0.075 mg daily

## 2013-06-06 NOTE — Progress Notes (Signed)
Subjective:    Patient ID: Tara [name], female    DOB: 08/30/1956, 57 y.o.   MRN: [id number]  HPI  57 year old White female for health maintenance and evaluation of medical problems.  She first presented to the office October 2013. She was referred by Dr. Darnelle Catalan is her oncologist. Patient found a mass in her left breast while residing in [place] in 2012. Mammogram showed suspicious abnormality in the left breast measuring about 10 mm and physical exam showed a 1 cm firm superficial mass in the axillary region of the breast. Biopsy was performed October 2012 at [facility]. She had invasive ductal carcinoma grade 3, estrogen receptor positive, progesterone receptor positive. Tumor is HER-2 positive by fish with a ratio of 2.5. Subsequently had definitive left lumpectomy and sentinel node sampling on 08/03/2011. 2 sentinel nodes were negative for tumor. Decision was made to proceed with 4 cycles of adjuvant chemotherapy consisting of docetaxel and cyclophosphamide given with trastuzumab every 3 weeks with Neulasta on day 2 for granulocyte support. Trastuzumab was to be continued for one year.  Chemotherapy was started here in January 2013. She developed cellulitis in the left breast and was started on Keflex. She has done well with no significant peripheral neuropathy. She was treated with radiation as well. She is now on anastrozole as an anti-estrogen.  She has a history of obesity, hypothyroidism, allergic rhinitis, asthma, status post bilateral hip replacements, history of TTP, history of hepatitis C. She also has a history of depression and has been on Celexa for some time. She would like to come off of that and I have given her way to taper off of that over the next 2 months.  FHx: Mother has been diagnosed with some type of skin cancer but  pt not sure which type. Father died of COPD complications. Older sister and younger brother healthy. Family history of breast cancer in the  immediate family.  Additional past medical history: Patient had TTP diagnosed at age 11. Do not have splenectomy. She had a recurrence at age 89 while being treated with interferon for hepatitis C.  History of asthma, history of seasonal allergic rhinitis.  History of hepatitis C treated with interferon.  Past history of hypothyroidism on thyroid replacement therapy.  History of right hip replacement 2005, left hip replacement 2007. Left foot reconstruction 2008. History of fractured left elbow with pins placed. History of left arm lymphedema.  New problems today include some bilateral pain at night in her lower back when she tries to lie down. Suspect this is musculoskeletal pain and have prescribed Flexeril at bedtime. Also having issues with dependent edema. TSH is not a: 1.00 in Synthroid dose needs to be increased  Review of Systems  Constitutional: Negative.   HENT: Negative.   Eyes: Negative.   Respiratory: Negative.   Cardiovascular: Negative.   Gastrointestinal: Negative.   Endocrine:       History of hypothyroidism  Genitourinary: Negative.   Musculoskeletal: Negative.   Skin:       Complaining of rash on right arm and is to see dermatologist on Friday, October 10  Allergic/Immunologic: Positive for environmental allergies.  Neurological: Negative.   Hematological: Negative.   Psychiatric/Behavioral: Negative.        Objective:   Physical Exam  Vitals reviewed. Constitutional: She is oriented to person, place, and time. She appears well-developed and well-nourished. No distress.  HENT:  Head: Normocephalic and atraumatic.  Right Ear: External ear normal.  Left  Ear: External ear normal.  Mouth/Throat: Oropharynx is clear and moist. No oropharyngeal exudate.  Eyes: Conjunctivae and EOM are normal. Pupils are equal, round, and reactive to light. Right eye exhibits no discharge. Left eye exhibits no discharge. No scleral icterus.  Neck: Neck supple. No JVD present.  No thyromegaly present.  Cardiovascular: Normal rate, regular rhythm, normal heart sounds and intact distal pulses.   No murmur heard. Pulmonary/Chest: Effort normal and breath sounds normal. She has no wheezes. She has no rales.  Breasts normal female  Abdominal: Soft. Bowel sounds are normal. She exhibits no distension and no mass. There is no tenderness. There is no rebound and no guarding.  Genitourinary:  Pap taken no masses on bimanual exam  Musculoskeletal: Normal range of motion. She exhibits edema.  Lower extremity edema 1+ nonpitting  Lymphadenopathy:    She has no cervical adenopathy.  Neurological: She is alert and oriented to person, place, and time. She has normal reflexes. No cranial nerve deficit. Coordination normal.  Skin: Skin is warm and dry. She is not diaphoretic.  Superficial varicosities lower extremities  Psychiatric: She has a normal mood and affect. Her behavior is normal. Judgment and thought content normal.          Assessment & Plan:  History of breast cancer-to see oncologist later this week  Hypothyroidism-change dose of Synthroid to 0.075 mg daily and followup in 6 months.  Dependent edema-start Demadex 20 mg daily and return in 4-6 weeks for followup along with basic metabolic panel.  Superficial varicosities lower extremities  Status post bilateral hip replacements  History of hepatitis C-status post treatment with interferon  History of TTP-has had this twice  History of allergic rhinitis  History of asthma  Obesity  History of depression-decrease citalopram 10 mg one half tablet daily for 2-4 weeks then every other day for 2 -4 weeks then try to discontinue.  Plan as above return in 4-6 weeks for followup on the dependent edema.  History of TTP  History of hepatitis C  Low back pain. Try Flexeril 10 mg one half tablet at bedtime  Plan: Return in  4-6 weeks. Were going to increase Synthroid slightly because of TSH not being a  goal of 1.00. Start Demadex 20 mg daily for dependent edema. Will need to followup with basic metabolic panel upon return in 4-6 weeks.

## 2013-06-07 ENCOUNTER — Other Ambulatory Visit (HOSPITAL_BASED_OUTPATIENT_CLINIC_OR_DEPARTMENT_OTHER): Payer: BC Managed Care – PPO | Admitting: Lab

## 2013-06-07 ENCOUNTER — Encounter: Payer: Self-pay | Admitting: Internal Medicine

## 2013-06-07 ENCOUNTER — Ambulatory Visit (HOSPITAL_BASED_OUTPATIENT_CLINIC_OR_DEPARTMENT_OTHER): Payer: BC Managed Care – PPO | Admitting: Physician Assistant

## 2013-06-07 ENCOUNTER — Telehealth: Payer: Self-pay | Admitting: *Deleted

## 2013-06-07 ENCOUNTER — Encounter: Payer: Self-pay | Admitting: Physician Assistant

## 2013-06-07 VITALS — BP 132/77 | HR 98 | Temp 97.7°F | Resp 18 | Ht 67.0 in | Wt 239.7 lb

## 2013-06-07 DIAGNOSIS — C50419 Malignant neoplasm of upper-outer quadrant of unspecified female breast: Secondary | ICD-10-CM

## 2013-06-07 DIAGNOSIS — Z170 Estrogen receptor positive status [ER+]: Secondary | ICD-10-CM

## 2013-06-07 DIAGNOSIS — Z853 Personal history of malignant neoplasm of breast: Secondary | ICD-10-CM

## 2013-06-07 DIAGNOSIS — C50912 Malignant neoplasm of unspecified site of left female breast: Secondary | ICD-10-CM

## 2013-06-07 DIAGNOSIS — Z862 Personal history of diseases of the blood and blood-forming organs and certain disorders involving the immune mechanism: Secondary | ICD-10-CM

## 2013-06-07 DIAGNOSIS — Z780 Asymptomatic menopausal state: Secondary | ICD-10-CM

## 2013-06-07 DIAGNOSIS — I890 Lymphedema, not elsewhere classified: Secondary | ICD-10-CM

## 2013-06-07 LAB — COMPREHENSIVE METABOLIC PANEL (CC13)
ALT: 29 U/L (ref 0–55)
AST: 28 U/L (ref 5–34)
Albumin: 3.7 g/dL (ref 3.5–5.0)
Alkaline Phosphatase: 73 U/L (ref 40–150)
Anion Gap: 13 mEq/L — ABNORMAL HIGH (ref 3–11)
BUN: 9.4 mg/dL (ref 7.0–26.0)
CO2: 25 mEq/L (ref 22–29)
Calcium: 8.7 mg/dL (ref 8.4–10.4)
Chloride: 106 mEq/L (ref 98–109)
Creatinine: 0.8 mg/dL (ref 0.6–1.1)
Glucose: 124 mg/dl (ref 70–140)
Potassium: 4 mEq/L (ref 3.5–5.1)
Sodium: 143 mEq/L (ref 136–145)
Total Bilirubin: 0.75 mg/dL (ref 0.20–1.20)
Total Protein: 7.7 g/dL (ref 6.4–8.3)

## 2013-06-07 LAB — CBC WITH DIFFERENTIAL/PLATELET
BASO%: 0.8 % (ref 0.0–2.0)
Basophils Absolute: 0 10*3/uL (ref 0.0–0.1)
EOS%: 4.5 % (ref 0.0–7.0)
Eosinophils Absolute: 0.2 10*3/uL (ref 0.0–0.5)
HCT: 39.2 % (ref 34.8–46.6)
HGB: 13.2 g/dL (ref 11.6–15.9)
LYMPH%: 12.1 % — ABNORMAL LOW (ref 14.0–49.7)
MCH: 32.1 pg (ref 25.1–34.0)
MCHC: 33.7 g/dL (ref 31.5–36.0)
MCV: 95.3 fL (ref 79.5–101.0)
MONO#: 0.4 10*3/uL (ref 0.1–0.9)
MONO%: 8.7 % (ref 0.0–14.0)
NEUT#: 3.6 10*3/uL (ref 1.5–6.5)
NEUT%: 73.9 % (ref 38.4–76.8)
Platelets: 128 10*3/uL — ABNORMAL LOW (ref 145–400)
RBC: 4.11 10*6/uL (ref 3.70–5.45)
RDW: 13.7 % (ref 11.2–14.5)
WBC: 4.9 10*3/uL (ref 3.9–10.3)
lymph#: 0.6 10*3/uL — ABNORMAL LOW (ref 0.9–3.3)

## 2013-06-07 NOTE — Progress Notes (Signed)
ID: [name]   DOB: [birth year]  MR#: [id number]  MVH#:[id number]  PCP:  [name], MD GYN: SU:  Tara Pretty, MD Other:  Lina Sar, MD;  Chipper Herb, MD;  Arvilla Meres, MD  CHIEF COMPLAINT:   Left Breast Cancer   HISTORY OF PRESENT ILLNESS: Tara [name] noted a mass in her left breast and brought it to her physician's attention in [place], where she was residing. She was set up for mammography 06/24/2011 at [facility] in [place], [place]. This showed a suspicious abnormality in the left breast measuring approximately 10 mm. Physical examination showed a 1 cm firm superficial mass in the axillary region of the breast and ultrasound showed this to be 11 mm and spiculated.  Biopsy was performed June 25, 2011, and showed ([id number] at the [facility]) and invasive ductal carcinoma, grade 3, estrogen receptor 95% positive, progesterone receptor 40% positive, with an MIB-1 of 29%. The tumor is HER 2 positive by FISH with a ratio of 2.5. There is tumor heterogeneity noted.  With this information the case was presented Jul 08, 2011 at the multidisciplinary  breast cancer clinic. She proceeded to definitive left lumpectomy and sentinel lymph node sampling 08/03/2011. This confirmed a 1.6 cm invasive ductal carcinoma, grade 3. Margins were ample. The two sentinel lymph nodes were clear.  Her subsequent history is as detailed below.  INTERVAL HISTORY: Tara [name] returns alone today  for followup of her left breast carcinoma. Interval history is generally unremarkable, and Tara [name] is feeling well. She continues on letrozole with good tolerance. Specifically, she's had no increased hot flashes, no problems with vaginal dryness, and no increased joint pain. Since her last appointment here, Tara [name] has established herself with Dr. Lenord Fellers and Dr. Juanda Chance, and is very pleased with both of these physicians.  She tells me her husband Tara [name] has recovered well from his recent surgery for  prostate cancer, but is still out of work. He is actively looking for a job, and Tara [name] worries that they may have to move again for him to find work. She is willing to do so, but hates being in limbo.  REVIEW OF SYSTEMS: Tara [name] has had no recent illnesses and denies any fevers, chills, or night sweats. Her energy level has improved. She has a good appetite with no nausea or emesis. She's been followed by Dr. Juanda Chance for her IBS, and has had significant improvement in her diarrhea. She has no cough or phlegm production. She has some shortness of breath with exertion which is stable. She's had no chest pain or palpitations. She has chronic swelling in her feet and ankles, and this comes and goes. She has occasional cramping in her legs. She does have some joint pain she attributes to arthritis, but this has not increased. She has a rash on her right upper arm and is scheduled to see a dermatologist in the next couple of weeks.  A detailed review of systems is otherwise stable and noncontributory.   PAST MEDICAL HISTORY: Past Medical History  Diagnosis Date  . Thrombocytopenia, primary   . Asthma   . Arthritis   . Complication of anesthesia     difficulty waking up/dizzy/lightheaded  . Environmental allergies   . Shortness of breath   . Hypothyroidism   . Blood transfusion   . Anxiety   . Dysrhythmia     irregular heartbeat, takes digoxin  . Chronic edema   . Breast cancer 2012    left breast  . Hx of radiation  therapy 12/15/11 - 01/29/12    left breast  . H/O varicella   . History of measles, mumps, or rubella   . TTP (thrombotic thrombocytopenic purpura)   . Hepatitis C   . H/O varicose veins    PAST SURGICAL HISTORY: Past Surgical History  Procedure Laterality Date  . Elbow pins Left   . Total hip arthroplasty Bilateral   . Foot surgery Left     multiple  . Portacath placement  08/03/2011    Procedure: INSERTION PORT-A-CATH;  Surgeon: Robyne Askew, MD;  Location: [facility] OR;   Service: General;  Laterality: Right;  . Breast lumpectomy  08/03/11    left lumpectomy and slnbx,T1cN0,triple pos    FAMILY HISTORY Family History  Problem Relation Age of Onset  . Brain cancer Maternal Grandfather   . Alcohol abuse Maternal Grandfather   . Heart disease Neg Hx   . Hyperlipidemia Neg Hx   . Hypertension Neg Hx   Patient's father died at the age of 79 from emphysema in the setting of tobacco abuse; patient's mother is alive. The patient has one brother and one sister; there is no history of breast or ovarian cancer in the immediate family   Gynecologic history: She is GX P0 menarche age 69 most recent period July of 2008 she never took hormone replacement   Social History: (Updated 06/07/2013) The patient is a homemaker; she used to work in an office previously. Her husband [name] is a Magazine features editor, but is currently unemployed and looking for a job. They moved here from [facility] Oct 2012. Her dog's name is Billey Gosling (a dachshund/chihuahua mix)   ADVANCED DIRECTIVES:  HEALTH MAINTENANCE: (Updated 06/07/2013) History  Substance Use Topics  . Smoking status: Never Smoker   . Smokeless tobacco: Never Used  . Alcohol Use: 0.6 oz/week    1 Glasses of wine per week     Colonoscopy: Signoidoscopy Aug 2014, Dr. Juanda Chance  PAP: Oct 2014, Dr. Lenord Fellers  Bone density: Nov 2011/ nl  Lipid panel: Oct. 2014, Dr. Lenord Fellers   Allergies  Allergen Reactions  . Aspirin     Hx of TTP  . Nsaids     Hx TTP    Current Outpatient Prescriptions  Medication Sig Dispense Refill  . acetaminophen (TYLENOL) 500 MG tablet Take 500 mg by mouth every 6 (six) hours as needed. For pain       . albuterol (PROVENTIL HFA;VENTOLIN HFA) 108 (90 BASE) MCG/ACT inhaler Inhale 2 puffs into the lungs every 6 (six) hours as needed. For shortness of breath       . budesonide (PULMICORT FLEXHALER) 180 MCG/ACT inhaler Inhale 2 puffs into the lungs daily.  3 Inhaler  3  . cholecalciferol (VITAMIN D) 1000 UNITS tablet  Take 2,000 Units by mouth daily.        . citalopram (CELEXA) 10 MG tablet TAKE 1 TABLET BY MOUTH EVERY DAILY  90 tablet  0  . colesevelam (WELCHOL) 625 MG tablet Take 2 tablets (1,250 mg total) by mouth daily.  60 tablet  1  . cyclobenzaprine (FLEXERIL) 10 MG tablet Take 1 tablet (10 mg total) by mouth 3 (three) times daily as needed for muscle spasms.  30 tablet  2  . digoxin (LANOXIN) 0.25 MG tablet Take 1 tablet (0.25 mg total) by mouth daily.  90 tablet  1  . esomeprazole (NEXIUM) 40 MG capsule Take 1 capsule (40 mg total) by mouth daily before breakfast.  30 capsule  2  . fluticasone (  FLONASE) 50 MCG/ACT nasal spray Place 2 sprays into the nose daily.  1 g  0  . glucosamine-chondroitin 500-400 MG tablet Take 1 tablet by mouth daily.       . hyoscyamine (LEVBID) 0.375 MG 12 hr tablet Take 1 tablet (0.375 mg total) by mouth 2 (two) times daily.  60 tablet  1  . letrozole (FEMARA) 2.5 MG tablet Take 1 tablet (2.5 mg total) by mouth daily.  90 tablet  3  . levothyroxine (SYNTHROID, LEVOTHROID) 75 MCG tablet Take 1 tablet (75 mcg total) by mouth daily.  90 tablet  1  . loperamide (IMODIUM A-D) 2 MG tablet Take 2 mg by mouth 4 (four) times daily as needed. For diarrhea       . Loratadine (CLARITIN) 10 MG CAPS Take by mouth as needed.      . montelukast (SINGULAIR) 10 MG tablet TAKE 1 TABLET BY MOUTH EVERY EVENING  90 tablet  0  . OVER THE COUNTER MEDICATION Take 1 tablet by mouth daily as needed. For gas pain      . pimecrolimus (ELIDEL) 1 % cream Apply 1 application topically 2 (two) times daily as needed. For exczema       . torsemide (DEMADEX) 20 MG tablet Take 1 tablet (20 mg total) by mouth daily.  30 tablet  2  . vitamin B-12 (CYANOCOBALAMIN) 100 MCG tablet Take 2,000 mcg by mouth daily.        No current facility-administered medications for this visit.    OBJECTIVE: Middle-aged white woman in no acute distress Filed Vitals:   06/07/13 1008  BP: 132/77  Pulse: 98  Temp: 97.7 F  (36.5 C)  Resp: 18     Body mass index is 37.53 kg/(m^2).    ECOG FS: 0 Filed Weights   06/07/13 1008  Weight: 239 lb 11.2 oz (108.727 kg)   Physical Exam: HEENT:  Sclerae anicteric.  Oropharynx clear.  NODES:  No cervical or supraclavicular lymphadenopathy palpated.  BREAST EXAM: Right breast is unremarkable. Left breast is status post lumpectomy with no suspicious nodularities or evidence of local recurrence. Axillae are benign bilaterally, no palpable adenopathy. LUNGS:  Clear to auscultation bilaterally.  No wheezes or rhonchi HEART:  Regular rate and rhythm. No murmur  ABDOMEN:  Soft, obese, nontender.  Positive bowel sounds.  MSK:  No focal spinal tenderness to palpation. Full range of motion in the upper extremities. EXTREMITIES:  Slight lymphedema in the left upper extremity. Otherwise, no peripheral edema. NEURO:  Nonfocal. Well oriented.  Positive affect. SKIN:  There is an erythematous maculopapular rash on the right upper arm measuring approximately 5 cm at its widest diameter, consistent with dermatitis. No vesicles or pustules noted. Skin appears otherwise benign.      LAB RESULTS: Lab Results  Component Value Date   WBC 4.9 06/07/2013   NEUTROABS 3.6 06/07/2013   HGB 13.2 06/07/2013   HCT 39.2 06/07/2013   MCV 95.3 06/07/2013   PLT 128* 06/07/2013      Chemistry      Component Value Date/Time   NA 143 06/07/2013 0954   NA 138 06/05/2013 0856   K 4.0 06/07/2013 0954   K 4.0 06/05/2013 0856   CL 102 06/05/2013 0856   CL 108* 12/05/2012 0953   CO2 25 06/07/2013 0954   CO2 28 06/05/2013 0856   BUN 9.4 06/07/2013 0954   BUN 9 06/05/2013 0856   CREATININE 0.8 06/07/2013 0954   CREATININE 0.64 06/05/2013 0856  CREATININE 0.70 04/18/2012 1005      Component Value Date/Time   CALCIUM 8.7 06/07/2013 0954   CALCIUM 8.9 06/05/2013 0856   ALKPHOS 73 06/07/2013 0954   ALKPHOS 65 06/05/2013 0856   AST 28 06/07/2013 0954   AST 28 06/05/2013 0856   ALT 29 06/07/2013 0954   ALT 26  06/05/2013 0856   BILITOT 0.75 06/07/2013 0954   BILITOT 0.7 06/05/2013 0856      STUDIES: 2-D echocardiogram on 10/06/2012 showed an ejection fraction of 55 - 60%.   A unilateral left diagnostic mammogram was obtained at [facility] on 01/10/2013 for short term followup. Scattered parenchymal densities were present, but breast parenchymal pattern is stable. There were no new or worrisome findings in the left breast, and changes are compatible with prior surgery. Bilateral diagnostic mammogram was recommended in 6 months to resume routine followup.   ASSESSMENT: 57 year old [place] woman originally from [place]   1. Status post left lumpectomy and sentinel lymph node sampling 08/03/2011 for a T1c N0 (Stage IA) invasive ductal carcinoma, high-grade, triple positive.   2. treated in the adjuvant setting with 4 doses of docetaxel and cyclophosphamide completed 11/16/2011 given along with trastuzumab  3. Trastuzumab continued to total of one year (to December 2013)  4. Status post radiation therapy, completed late May of 2013.  5. began on anastrozole in early June 2013, 1 mg daily. Discontinued in September 2013 due to in tolerance (rash). Started on letrozole in October 2013.  7. Comorbidities include a history of remote thrombotic thrombocytopenic purpura and hepatitis C.    PLAN: Azriel has no clinical evidence of disease recurrence. She will continue on letrozole which we have refilled for another year. She is due for her next bilateral mammogram at [facility] in November. She's also due for her next bone density, and this will be ordered for November as well. She'll then return to see [name] in 6 months, April 2015, for repeat labs and physical exam.  If Vonette finds that she is going to be moving out of town, she will let us know and we will likely try to see her one more time before she moves. The plan is to continue antiestrogen therapy for a total of 5 years (until June 2018). Lorayne voices  understanding and agreement with this plan, and will call with any changes or problems.  Theresea Trautmann    06/07/2013

## 2013-06-07 NOTE — Telephone Encounter (Signed)
appts made and printed. Pt is aware that i will call with appt for [facility] due to after waiting 10 mins no more came to the phone...td

## 2013-06-08 ENCOUNTER — Telehealth: Payer: Self-pay | Admitting: *Deleted

## 2013-06-08 NOTE — Telephone Encounter (Signed)
sw pt gv appt for [facility]. 07/17/13 with bone density@ 10am and mammo@ 10:30am....td

## 2013-06-14 ENCOUNTER — Other Ambulatory Visit: Payer: Self-pay | Admitting: Internal Medicine

## 2013-06-21 ENCOUNTER — Other Ambulatory Visit: Payer: Self-pay | Admitting: Internal Medicine

## 2013-06-26 ENCOUNTER — Ambulatory Visit (INDEPENDENT_AMBULATORY_CARE_PROVIDER_SITE_OTHER): Payer: BC Managed Care – PPO | Admitting: Internal Medicine

## 2013-06-26 ENCOUNTER — Encounter: Payer: Self-pay | Admitting: Internal Medicine

## 2013-06-26 VITALS — BP 120/68 | HR 104 | Temp 97.9°F | Ht 67.0 in | Wt 243.0 lb

## 2013-06-26 DIAGNOSIS — R609 Edema, unspecified: Secondary | ICD-10-CM

## 2013-06-26 LAB — BASIC METABOLIC PANEL
BUN: 11 mg/dL (ref 6–23)
CO2: 32 mEq/L (ref 19–32)
Calcium: 8.4 mg/dL (ref 8.4–10.5)
Chloride: 97 mEq/L (ref 96–112)
Creat: 0.81 mg/dL (ref 0.50–1.10)
Glucose, Bld: 162 mg/dL — ABNORMAL HIGH (ref 70–99)
Potassium: 3.7 mEq/L (ref 3.5–5.3)
Sodium: 139 mEq/L (ref 135–145)

## 2013-06-26 MED ORDER — BUDESONIDE 180 MCG/ACT IN AEPB: 1.0000 | INHALATION_SPRAY | Freq: Two times a day (BID) | RESPIRATORY_TRACT | Status: DC

## 2013-06-26 NOTE — Patient Instructions (Signed)
Return in 6 months. Pulmicort refilled. Continue same meds.

## 2013-06-26 NOTE — Progress Notes (Signed)
  Subjective:    Patient ID: Tara [name], female    DOB: 02-27-1956, 57 y.o.   MRN: [id number]  HPI  For follow up of dependent edema. Needs Pulmicort RX which was refilled today for one year. Says that she feels slightly dizzy when she gets up suddenly on new diarrhea that. Needs to stay well hydrated and take time changing positions. Thinks that edema has gotten a bit better. Her mouth has been a bit dry.    Review of Systems     Objective:   Physical Exam neck is supple without JVD thyromegaly or carotid bruits. Chest clear to auscultation. Cardiac exam regular rate and rhythm normal S1 and S2. Still has significant lower extremity edema but it is nonpitting today and I think improved        Assessment & Plan:  Dependent edema-basic metabolic panel drawn today  History of breast cancer  Plan: Return in 6 months for office visit and basic metabolic panel.

## 2013-08-08 ENCOUNTER — Other Ambulatory Visit: Payer: Self-pay | Admitting: Internal Medicine

## 2013-08-31 ENCOUNTER — Other Ambulatory Visit: Payer: Self-pay | Admitting: Internal Medicine

## 2013-09-01 ENCOUNTER — Other Ambulatory Visit: Payer: Self-pay | Admitting: Internal Medicine

## 2013-09-07 ENCOUNTER — Encounter: Payer: Self-pay | Admitting: Internal Medicine

## 2013-09-07 ENCOUNTER — Ambulatory Visit (INDEPENDENT_AMBULATORY_CARE_PROVIDER_SITE_OTHER): Payer: BC Managed Care – PPO | Admitting: Internal Medicine

## 2013-09-07 VITALS — BP 126/80 | HR 92 | Temp 97.5°F | Wt 240.0 lb

## 2013-09-07 DIAGNOSIS — R252 Cramp and spasm: Secondary | ICD-10-CM

## 2013-09-07 DIAGNOSIS — R233 Spontaneous ecchymoses: Secondary | ICD-10-CM

## 2013-09-07 DIAGNOSIS — R238 Other skin changes: Secondary | ICD-10-CM

## 2013-09-07 LAB — CBC WITH DIFFERENTIAL/PLATELET
Basophils Absolute: 0 10*3/uL (ref 0.0–0.1)
Basophils Relative: 1 % (ref 0–1)
Eosinophils Absolute: 0.3 10*3/uL (ref 0.0–0.7)
Eosinophils Relative: 5 % (ref 0–5)
HCT: 38.5 % (ref 36.0–46.0)
Hemoglobin: 13.5 g/dL (ref 12.0–15.0)
Lymphocytes Relative: 12 % (ref 12–46)
Lymphs Abs: 0.7 10*3/uL (ref 0.7–4.0)
MCH: 31.6 pg (ref 26.0–34.0)
MCHC: 35.1 g/dL (ref 30.0–36.0)
MCV: 90.2 fL (ref 78.0–100.0)
Monocytes Absolute: 0.5 10*3/uL (ref 0.1–1.0)
Monocytes Relative: 9 % (ref 3–12)
Neutro Abs: 4.1 10*3/uL (ref 1.7–7.7)
Neutrophils Relative %: 73 % (ref 43–77)
Platelets: 171 10*3/uL (ref 150–400)
RBC: 4.27 MIL/uL (ref 3.87–5.11)
RDW: 14.1 % (ref 11.5–15.5)
WBC: 5.6 10*3/uL (ref 4.0–10.5)

## 2013-09-07 NOTE — Progress Notes (Signed)
   Subjective:    Patient ID: Tara [name], female    DOB: 08-07-1956, 58 y.o.   MRN: [id number]  HPI  History of TTP. Before Christmas around December 18th, patient   noted bruise right hand present for about a week without known trauma. Has had TTP twice. Once time caused  by Interferon at age 40.  Bruise seemed to get better, but  to subsequently appeared to recur. Now resolving again.  Also rash right upper arm which is longstanding. New rash left trunk that is itchy. Also, complaining lower back pain with cramps in legs.    Review of Systems     Objective:   Physical Exam resolving bruise right hand. Straight leg raising is negative at 90. Deep tendon reflexes 2+ and symmetrical. Muscle strength in lower extremities is normal. Left trunk rash is nonspecific and looks fairly benign.        Assessment & Plan:   Contusion right hand  Eczema left trunk  History of TTP  Nocturnal leg cramps  Plan: CBC, PT and PTT drawn. I think contusion on right hand will resolve. Check magnesium. Recommend over-the-counter magnesium supplement for leg cramps.

## 2013-09-08 LAB — PROTIME-INR
INR: 0.96 (ref <1.50)
Prothrombin Time: 12.7 s (ref 11.6–15.2)

## 2013-09-08 LAB — MAGNESIUM: Magnesium: 2 mg/dL (ref 1.5–2.5)

## 2013-09-08 LAB — APTT: aPTT: 26 seconds (ref 24–37)

## 2013-09-08 NOTE — Progress Notes (Signed)
Patient informed. 

## 2013-09-11 ENCOUNTER — Other Ambulatory Visit: Payer: Self-pay | Admitting: *Deleted

## 2013-09-11 DIAGNOSIS — C50912 Malignant neoplasm of unspecified site of left female breast: Secondary | ICD-10-CM

## 2013-09-11 DIAGNOSIS — Z853 Personal history of malignant neoplasm of breast: Secondary | ICD-10-CM

## 2013-09-11 MED ORDER — LETROZOLE 2.5 MG PO TABS: 2.5000 mg | ORAL_TABLET | Freq: Every day | ORAL | Status: DC

## 2013-10-05 ENCOUNTER — Other Ambulatory Visit: Payer: Self-pay

## 2013-10-05 MED ORDER — FLUTICASONE PROPIONATE HFA 44 MCG/ACT IN AERO: 2.0000 | INHALATION_SPRAY | Freq: Two times a day (BID) | RESPIRATORY_TRACT | Status: DC

## 2013-10-06 ENCOUNTER — Other Ambulatory Visit: Payer: Self-pay | Admitting: Internal Medicine

## 2013-10-19 ENCOUNTER — Telehealth: Payer: Self-pay | Admitting: *Deleted

## 2013-10-19 ENCOUNTER — Other Ambulatory Visit: Payer: Self-pay | Admitting: Internal Medicine

## 2013-10-19 NOTE — Telephone Encounter (Signed)
sw pt informed her that [facility] will be on call 12/07/13. gv appt for 12/04/13@ 1:15pm w/ [facility]. Pt is aware...td

## 2013-10-31 ENCOUNTER — Encounter: Payer: Self-pay | Admitting: Internal Medicine

## 2013-10-31 ENCOUNTER — Other Ambulatory Visit: Payer: Self-pay | Admitting: *Deleted

## 2013-10-31 MED ORDER — COLESEVELAM HCL 625 MG PO TABS: ORAL_TABLET | ORAL | Status: DC

## 2013-10-31 NOTE — Telephone Encounter (Signed)
Dr Olevia Perches, please see email from 05/03/13. You told patient that she would need to stay on her medication at least 6 months before possibly tapering off and that you may see her. She states that she is doing well on the welchol. Do you want her to continue the medication, taper the medication or come in for office visit to discuss?

## 2013-11-06 ENCOUNTER — Other Ambulatory Visit: Payer: Self-pay | Admitting: Internal Medicine

## 2013-11-28 ENCOUNTER — Telehealth: Payer: Self-pay | Admitting: Physician Assistant

## 2013-11-28 NOTE — Telephone Encounter (Signed)
, °

## 2013-11-30 ENCOUNTER — Other Ambulatory Visit: Payer: BC Managed Care – PPO

## 2013-12-04 ENCOUNTER — Telehealth: Payer: Self-pay | Admitting: Oncology

## 2013-12-04 ENCOUNTER — Ambulatory Visit (HOSPITAL_BASED_OUTPATIENT_CLINIC_OR_DEPARTMENT_OTHER): Payer: BC Managed Care – PPO | Admitting: Physician Assistant

## 2013-12-04 ENCOUNTER — Other Ambulatory Visit (HOSPITAL_BASED_OUTPATIENT_CLINIC_OR_DEPARTMENT_OTHER): Payer: BC Managed Care – PPO

## 2013-12-04 ENCOUNTER — Encounter: Payer: Self-pay | Admitting: Physician Assistant

## 2013-12-04 VITALS — BP 119/74 | HR 92 | Temp 98.0°F | Resp 18 | Ht 67.0 in | Wt 238.1 lb

## 2013-12-04 DIAGNOSIS — Z853 Personal history of malignant neoplasm of breast: Secondary | ICD-10-CM

## 2013-12-04 DIAGNOSIS — C50919 Malignant neoplasm of unspecified site of unspecified female breast: Secondary | ICD-10-CM

## 2013-12-04 DIAGNOSIS — Z862 Personal history of diseases of the blood and blood-forming organs and certain disorders involving the immune mechanism: Secondary | ICD-10-CM

## 2013-12-04 DIAGNOSIS — J309 Allergic rhinitis, unspecified: Secondary | ICD-10-CM

## 2013-12-04 DIAGNOSIS — E876 Hypokalemia: Secondary | ICD-10-CM

## 2013-12-04 DIAGNOSIS — Z170 Estrogen receptor positive status [ER+]: Secondary | ICD-10-CM

## 2013-12-04 DIAGNOSIS — C50912 Malignant neoplasm of unspecified site of left female breast: Secondary | ICD-10-CM

## 2013-12-04 DIAGNOSIS — C50419 Malignant neoplasm of upper-outer quadrant of unspecified female breast: Secondary | ICD-10-CM

## 2013-12-04 LAB — CBC WITH DIFFERENTIAL/PLATELET
BASO%: 0.7 % (ref 0.0–2.0)
Basophils Absolute: 0 10*3/uL (ref 0.0–0.1)
EOS%: 3.8 % (ref 0.0–7.0)
Eosinophils Absolute: 0.2 10*3/uL (ref 0.0–0.5)
HCT: 38.3 % (ref 34.8–46.6)
HGB: 12.6 g/dL (ref 11.6–15.9)
LYMPH%: 13.1 % — ABNORMAL LOW (ref 14.0–49.7)
MCH: 31.2 pg (ref 25.1–34.0)
MCHC: 32.8 g/dL (ref 31.5–36.0)
MCV: 95.1 fL (ref 79.5–101.0)
MONO#: 0.5 10*3/uL (ref 0.1–0.9)
MONO%: 9.4 % (ref 0.0–14.0)
NEUT#: 3.7 10*3/uL (ref 1.5–6.5)
NEUT%: 73 % (ref 38.4–76.8)
Platelets: 140 10*3/uL — ABNORMAL LOW (ref 145–400)
RBC: 4.03 10*6/uL (ref 3.70–5.45)
RDW: 13.7 % (ref 11.2–14.5)
WBC: 5 10*3/uL (ref 3.9–10.3)
lymph#: 0.7 10*3/uL — ABNORMAL LOW (ref 0.9–3.3)

## 2013-12-04 LAB — COMPREHENSIVE METABOLIC PANEL (CC13)
ALT: 33 U/L (ref 0–55)
AST: 30 U/L (ref 5–34)
Albumin: 3.7 g/dL (ref 3.5–5.0)
Alkaline Phosphatase: 60 U/L (ref 40–150)
Anion Gap: 15 mEq/L — ABNORMAL HIGH (ref 3–11)
BUN: 9.5 mg/dL (ref 7.0–26.0)
CO2: 30 mEq/L — ABNORMAL HIGH (ref 22–29)
Calcium: 8.3 mg/dL — ABNORMAL LOW (ref 8.4–10.4)
Chloride: 99 mEq/L (ref 98–109)
Creatinine: 0.8 mg/dL (ref 0.6–1.1)
Glucose: 139 mg/dl (ref 70–140)
Potassium: 3.3 mEq/L — ABNORMAL LOW (ref 3.5–5.1)
Sodium: 144 mEq/L (ref 136–145)
Total Bilirubin: 0.92 mg/dL (ref 0.20–1.20)
Total Protein: 7.2 g/dL (ref 6.4–8.3)

## 2013-12-04 MED ORDER — POTASSIUM CHLORIDE CRYS ER 20 MEQ PO TBCR: 20.0000 meq | EXTENDED_RELEASE_TABLET | Freq: Two times a day (BID) | ORAL | Status: DC

## 2013-12-04 MED ORDER — LETROZOLE 2.5 MG PO TABS: 2.5000 mg | ORAL_TABLET | Freq: Every day | ORAL | Status: DC

## 2013-12-04 NOTE — Telephone Encounter (Signed)
, °

## 2013-12-04 NOTE — Progress Notes (Signed)
ID: Tara [name]   DOB: [birth year]  MR#: [id number]  ONG#:[id number]  PCP:  [name], MD GYN: SU:  Autumn Messing, MD Other:  Delfin Edis, MD;  Arloa Koh, MD;  Glori Bickers, MD  CHIEF COMPLAINT:   Hx of Left Breast Cancer; Here for routine follow up   HISTORY OF PRESENT ILLNESS: Tara [name] noted a mass in her left breast and brought it to her physician's attention in [place], where she was residing. She was set up for mammography 06/24/2011 at [facility] in [place], [place]. This showed a suspicious abnormality in the left breast measuring approximately 10 mm. Physical examination showed a 1 cm firm superficial mass in the axillary region of the breast and ultrasound showed this to be 11 mm and spiculated.  Biopsy was performed June 25, 2011, and showed ([id number] at the [facility]) and invasive ductal carcinoma, grade 3, estrogen receptor 95% positive, progesterone receptor 40% positive, with an MIB-1 of 29%. The tumor is HER 2 positive by FISH with a ratio of 2.5. There is tumor heterogeneity noted.  With this information the case was presented Jul 08, 2011 at the multidisciplinary  breast cancer clinic. She proceeded to definitive left lumpectomy and sentinel lymph node sampling 08/03/2011. This confirmed a 1.6 cm invasive ductal carcinoma, grade 3. Margins were ample. The two sentinel lymph nodes were clear.  Her subsequent history is as detailed below.  INTERVAL HISTORY: Tara [name] returns alone today for followup of her left breast carcinoma. She continues to do well, with no new complaints. She continues on letrozole with good tolerance. She denies any increased hot flashes, vaginal dryness, or increased joint pain. She does have pre-existing arthritis but this is stable and has not worsened since beginning the medication.  Tara [name] was set approximately 2 weeks ago with a "virus" and had a fever of 101, cough, and was nauseous. This resolved within 24-36 hours,  and she's had no fevers since that time.  Fortunately, her husband was recently given a contracted job doing Investment banker, corporate with [employer] in [place]. This was after a long period of employment, and he is thrilled to have work again. He is hopeful that this will become a permanent position.    REVIEW OF SYSTEMS: Tara [name]'s energy level is good, and she has no problems sleeping at night. She denies any rashes or skin changes and has had no abnormal bruising or bleeding. She has some ringing in her ears which is stable. Her appetite is good, and she's had no additional problems with nausea, no emesis, no change in bowel or bladder habits. She still has a slight residual cough from the recent viral illness, but this is improving. It is occasionally productive of clear phlegm, but she denies any hemoptysis and has had no increased shortness of breath. She has a history of an irregular heartbeat which has not changed. She denies any chest pain or pressure. She has intermittent swelling in her lower extremities which is stable. She has occasional muscle cramping. She continues to have  joint pain attributed to arthritis, but denies any new or unusual myalgias, arthralgias, or bony pain. She's had no abnormal headaches, dizziness, or change in vision.  A detailed review of systems is otherwise stable and noncontributory.   PAST MEDICAL HISTORY: Past Medical History  Diagnosis Date  . Thrombocytopenia, primary   . Asthma   . Arthritis   . Complication of anesthesia     difficulty waking up/dizzy/lightheaded  . Environmental allergies   .  Shortness of breath   . Hypothyroidism   . Blood transfusion   . Anxiety   . Dysrhythmia     irregular heartbeat, takes digoxin  . Chronic edema   . Breast cancer 2012    left breast  . Hx of radiation therapy 12/15/11 - 01/29/12    left breast  . H/O varicella   . History of measles, mumps, or rubella   . TTP (thrombotic thrombocytopenic purpura)   .  Hepatitis C   . H/O varicose veins    PAST SURGICAL HISTORY: Past Surgical History  Procedure Laterality Date  . Elbow pins Left   . Total hip arthroplasty Bilateral   . Foot surgery Left     multiple  . Portacath placement  08/03/2011    Procedure: INSERTION PORT-A-CATH;  Surgeon: Merrie Roof, MD;  Location: [facility];  Service: General;  Laterality: Right;  . Breast lumpectomy  08/03/11    left lumpectomy and slnbx,T1cN0,triple pos    FAMILY HISTORY Family History  Problem Relation Age of Onset  . Brain cancer Maternal Grandfather   . Alcohol abuse Maternal Grandfather   . Heart disease Neg Hx   . Hyperlipidemia Neg Hx   . Hypertension Neg Hx   Patient's father died at the age of 33 from emphysema in the setting of tobacco abuse; patient's mother is alive. The patient has one brother and one sister; there is no history of breast or ovarian cancer in the immediate family   Gynecologic history: She is Tara [name] menarche age 70 most recent period July of 2008 she never took hormone replacement   Social History: (Updated 12/04/2012) The patient is a homemaker; she used to work in an office previously. Her husband [name] is a Scientist, research (physical sciences), but is currently unemployed and looking for a job. They moved here from [facility] Oct 2012. Her dog's name is Eduard Clos (a dachshund/chihuahua mix)   ADVANCED DIRECTIVES:  HEALTH MAINTENANCE: (Updated 12/04/2012) History  Substance Use Topics  . Smoking status: Never Smoker   . Smokeless tobacco: Never Used  . Alcohol Use: 0.6 oz/week    1 Glasses of wine per week     Colonoscopy: Signoidoscopy Aug 2014, Dr. Olevia Perches  PAP: Oct 2014, Dr. Renold Genta  Bone density: November 2014, [facility], Normal  Lipid panel: Oct. 2014, Dr. Renold Genta   Allergies  Allergen Reactions  . Aspirin     Hx of TTP  . Nsaids     Hx TTP    Current Outpatient Prescriptions  Medication Sig Dispense Refill  . acetaminophen (TYLENOL) 500 MG tablet Take 500 mg by mouth every 6 (six) hours  as needed. For pain       . albuterol (PROVENTIL HFA;VENTOLIN HFA) 108 (90 BASE) MCG/ACT inhaler Inhale 2 puffs into the lungs every 6 (six) hours as needed. For shortness of breath       . budesonide (PULMICORT FLEXHALER) 180 MCG/ACT inhaler Inhale 1 puff into the lungs 2 (two) times daily.  3 Inhaler  3  . cholecalciferol (VITAMIN D) 1000 UNITS tablet Take 3,000 Units by mouth daily.       . citalopram (CELEXA) 10 MG tablet TAKE 1 TABLET BY MOUTH EVERY DAY  90 tablet  0  . colesevelam (WELCHOL) 625 MG tablet TAKE 2 TABLETS BY MOUTH EVERY DAY  60 tablet  2  . cyclobenzaprine (FLEXERIL) 10 MG tablet Take 1 tablet (10 mg total) by mouth 3 (three) times daily as needed for muscle spasms.  30 tablet  2  . DIGOX 250 MCG tablet TAKE 1 TABLET BY MOUTH EVERY DAY  90 tablet  1  . digoxin (LANOXIN) 0.25 MG tablet Take 1 tablet (0.25 mg total) by mouth daily.  90 tablet  1  . fluticasone (FLONASE) 50 MCG/ACT nasal spray Place 2 sprays into the nose daily.  1 g  0  . fluticasone (FLOVENT HFA) 44 MCG/ACT inhaler Inhale 2 puffs into the lungs every 12 (twelve) hours.  1 Inhaler  12  . glucosamine-chondroitin 500-400 MG tablet Take 1 tablet by mouth daily.       . hyoscyamine (LEVBID) 0.375 MG 12 hr tablet Take 1 tablet (0.375 mg total) by mouth 2 (two) times daily.  60 tablet  1  . letrozole (FEMARA) 2.5 MG tablet Take 1 tablet (2.5 mg total) by mouth daily.  90 tablet  3  . levothyroxine (SYNTHROID, LEVOTHROID) 75 MCG tablet Take 1 tablet (75 mcg total) by mouth daily.  90 tablet  1  . loperamide (IMODIUM A-D) 2 MG tablet Take 2 mg by mouth 4 (four) times daily as needed. For diarrhea       . Loratadine (CLARITIN) 10 MG CAPS Take by mouth as needed.      . montelukast (SINGULAIR) 10 MG tablet TAKE 1 TABLET BY MOUTH EVERY NIGHT AT BEDTIME  90 tablet  1  . OVER THE COUNTER MEDICATION Take 1 tablet by mouth daily as needed. For gas pain      . pimecrolimus (ELIDEL) 1 % cream Apply 1 application topically 2 (two)  times daily as needed. For exczema       . potassium chloride SA (K-DUR,KLOR-CON) 20 MEQ tablet Take 1 tablet (20 mEq total) by mouth 2 (two) times daily.  90 tablet  1  . torsemide (DEMADEX) 20 MG tablet TAKE 1 TABLET BY MOUTH DAILY  30 tablet  11  . vitamin B-12 (CYANOCOBALAMIN) 100 MCG tablet Take 2,000 mcg by mouth daily.        No current facility-administered medications for this visit.    OBJECTIVE: Middle-aged white woman  who appears well and is in no acute distress Filed Vitals:   12/04/13 1310  BP: 119/74  Pulse: 92  Temp: 98 F (36.7 C)  Resp: 18     Body mass index is 37.28 kg/(m^2).    ECOG FS: 0 Filed Weights   12/04/13 1310  Weight: 238 lb 1.6 oz (108.001 kg)   Physical Exam: HEENT:  Sclerae anicteric.  Oropharynx clear, pink, and moist. Neck supple, trachea midline. No thyromegaly.  NODES:  No cervical or supraclavicular lymphadenopathy palpated.  BREAST EXAM:  Right breast is unremarkable. Left breast is status post lumpectomy and radiation. There is some palpable scar tissue, but no suspicious masses and no skin changes noted. Axillae are benign bilaterally, with no palpable lymphadenopathy. LUNGS:  Clear to auscultation bilaterally with good excursion.  No wheezes or rhonchi. HEART:  Regular rate and rhythm. No murmur appreciated. ABDOMEN:  Soft, obese, nontender.  No organomegaly or masses. Positive bowel sounds.  MSK:  No focal spinal tenderness to palpation.  Range of motion bilaterally in the upper extremities.  EXTREMITIES:  No peripheral edema.  No significant lymphedema in the left upper extremity today. SKIN:  Benign with no visible rashes or skin lesions. No excessive ecchymoses or petechiae. No pallor. NEURO:  Nonfocal. Well oriented. Positive affect.       LAB RESULTS: Lab Results  Component Value Date   WBC 5.0 12/04/2013  NEUTROABS 3.7 12/04/2013   HGB 12.6 12/04/2013   HCT 38.3 12/04/2013   MCV 95.1 12/04/2013   PLT 140* 12/04/2013       Chemistry      Component Value Date/Time   NA 144 12/04/2013 1222   NA 139 06/26/2013 1056   K 3.3* 12/04/2013 1222   K 3.7 06/26/2013 1056   CL 97 06/26/2013 1056   CL 108* 12/05/2012 0953   CO2 30* 12/04/2013 1222   CO2 32 06/26/2013 1056   BUN 9.5 12/04/2013 1222   BUN 11 06/26/2013 1056   CREATININE 0.8 12/04/2013 1222   CREATININE 0.81 06/26/2013 1056   CREATININE 0.70 04/18/2012 1005      Component Value Date/Time   CALCIUM 8.3* 12/04/2013 1222   CALCIUM 8.4 06/26/2013 1056   ALKPHOS 60 12/04/2013 1222   ALKPHOS 65 06/05/2013 0856   AST 30 12/04/2013 1222   AST 28 06/05/2013 0856   ALT 33 12/04/2013 1222   ALT 26 06/05/2013 0856   BILITOT 0.92 12/04/2013 1222   BILITOT 0.7 06/05/2013 0856      STUDIES: 2-D echocardiogram on 10/06/2012 showed an ejection fraction of 55 - 60%.   A unilateral left diagnostic mammogram was obtained at [facility] on 01/10/2013 for short term followup. Scattered parenchymal densities were present, but breast parenchymal pattern is stable. There were no new or worrisome findings in the left breast, and changes are compatible with prior surgery. Bilateral diagnostic mammogram was recommended in 6 months to resume routine followup.  Subsequently, a bilateral mammogram was obtained at [facility] on 07/17/2013 and was unremarkable.  A bone density scan at [facility] on 07/17/2013 was normal.      ASSESSMENT: 58 year old [place] woman originally from [place]   1. Status post left lumpectomy and sentinel lymph node sampling 08/03/2011 for a T1c N0 (Stage IA) invasive ductal carcinoma, high-grade, triple positive.   2. treated in the adjuvant setting with 4 doses of docetaxel and cyclophosphamide completed 11/16/2011 given along with trastuzumab  3. Trastuzumab continued to total of one year (to December 2013)  4. Status post radiation therapy, completed late May of 2013.  5. began on anastrozole in early June 2013, 1 mg daily. Discontinued in September 2013 due to in tolerance  (rash). Started on letrozole in October 2013.  7. Comorbidities include a history of remote thrombotic thrombocytopenic purpura and hepatitis C.    PLAN: Kristyl  continues to do well with regards to her breast cancer, with no clinical evidence of disease recurrence. She is tolerating the letrozole well, and I have refilled this for her for another year. The goal will be to continue for total of 5 years, until June 2013.     She is due for her next bilateral mammogram in November, and we'll see [name] soon thereafter for repeat labs and physical exam. If Ayshia is still doing well at that point, we will likely begin seeing her on an annual basis.   I am starting her on a low dose of potassium, 20 mEq daily, for mild hypokalemia. We will recheck her labs in approximately 8-10 weeks to make sure that her potassium level has normalized. Otherwise, we will see her again in late November.  In the meanwhile, she will continue to follow with her other physicians as needed, including Dr. Renold Genta, her primary care physician.  All the above was reviewed today, and Bryton voices understanding and agreement with this plan.  She  will call with any changes or problems.  Nico Rogness  PA-C   12/04/2013

## 2013-12-05 ENCOUNTER — Encounter: Payer: Self-pay | Admitting: *Deleted

## 2013-12-07 ENCOUNTER — Ambulatory Visit: Payer: BC Managed Care – PPO | Admitting: Oncology

## 2013-12-18 ENCOUNTER — Ambulatory Visit (INDEPENDENT_AMBULATORY_CARE_PROVIDER_SITE_OTHER): Payer: BC Managed Care – PPO | Admitting: General Surgery

## 2013-12-19 ENCOUNTER — Ambulatory Visit (INDEPENDENT_AMBULATORY_CARE_PROVIDER_SITE_OTHER): Payer: BC Managed Care – PPO | Admitting: General Surgery

## 2013-12-25 ENCOUNTER — Ambulatory Visit (INDEPENDENT_AMBULATORY_CARE_PROVIDER_SITE_OTHER): Payer: BC Managed Care – PPO | Admitting: General Surgery

## 2013-12-25 ENCOUNTER — Encounter (INDEPENDENT_AMBULATORY_CARE_PROVIDER_SITE_OTHER): Payer: Self-pay | Admitting: General Surgery

## 2013-12-25 VITALS — BP 136/86 | HR 75 | Temp 97.6°F | Ht 67.0 in | Wt 238.2 lb

## 2013-12-25 DIAGNOSIS — C50919 Malignant neoplasm of unspecified site of unspecified female breast: Secondary | ICD-10-CM

## 2013-12-25 NOTE — Patient Instructions (Signed)
Continue regular self exams Continue femara

## 2013-12-25 NOTE — Progress Notes (Signed)
Subjective:     Patient ID: Tara [name], female   DOB: 04/01/1956, 58 y.o.   MRN: [id number]  HPI The patient is a 58 year old white female who is 2-1/2 years status post left breast lumpectomy and negative sentinel node biopsy for a T1 C. N0 left breast cancer. She was triple positive. She is taking letrozole and tolerating it well. She denies any breast pain.  Review of Systems  Constitutional: Negative.   HENT: Negative.   Eyes: Negative.   Respiratory: Negative.   Cardiovascular: Negative.   Gastrointestinal: Negative.   Endocrine: Negative.   Genitourinary: Negative.   Musculoskeletal: Positive for myalgias.  Skin: Negative.   Allergic/Immunologic: Negative.   Neurological: Negative.   Hematological: Negative.   Psychiatric/Behavioral: Negative.        Objective:   Physical Exam  Constitutional: She is oriented to person, place, and time. She appears well-developed and well-nourished.  HENT:  Head: Normocephalic and atraumatic.  Eyes: Conjunctivae and EOM are normal. Pupils are equal, round, and reactive to light.  Neck: Normal range of motion. Neck supple.  Cardiovascular: Normal rate, regular rhythm and normal heart sounds.   Pulmonary/Chest: Effort normal and breath sounds normal.  There is some palpable fullness to her scar in the lateral left breast secondary to radiation. Otherwise there is no palpable mass in either breast. There is no palpable axillary, supraclavicular, or cervical lymphadenopathy.  Abdominal: Soft. Bowel sounds are normal.  Musculoskeletal: Normal range of motion.  Lymphadenopathy:    She has no cervical adenopathy.  Neurological: She is alert and oriented to person, place, and time.  Skin: Skin is warm and dry.  Psychiatric: She has a normal mood and affect. Her behavior is normal.       Assessment:     The patient is to have years status post left breast lumpectomy for breast cancer     Plan:     At this point she will continue to take  letrozole. She will continue to do regular self exams. I will plan to see her back in one year

## 2013-12-28 ENCOUNTER — Other Ambulatory Visit: Payer: BC Managed Care – PPO | Admitting: Internal Medicine

## 2013-12-28 DIAGNOSIS — E876 Hypokalemia: Secondary | ICD-10-CM

## 2013-12-28 DIAGNOSIS — E039 Hypothyroidism, unspecified: Secondary | ICD-10-CM

## 2013-12-28 LAB — BASIC METABOLIC PANEL
BUN: 15 mg/dL (ref 6–23)
CO2: 30 mEq/L (ref 19–32)
Calcium: 9.5 mg/dL (ref 8.4–10.5)
Chloride: 99 mEq/L (ref 96–112)
Creat: 0.79 mg/dL (ref 0.50–1.10)
Glucose, Bld: 111 mg/dL — ABNORMAL HIGH (ref 70–99)
Potassium: 4.2 mEq/L (ref 3.5–5.3)
Sodium: 139 mEq/L (ref 135–145)

## 2013-12-28 LAB — TSH: TSH: 3.447 u[IU]/mL (ref 0.350–4.500)

## 2013-12-29 ENCOUNTER — Ambulatory Visit (INDEPENDENT_AMBULATORY_CARE_PROVIDER_SITE_OTHER): Payer: BC Managed Care – PPO | Admitting: Internal Medicine

## 2013-12-29 ENCOUNTER — Encounter: Payer: Self-pay | Admitting: Internal Medicine

## 2013-12-29 VITALS — BP 126/76 | HR 80 | Temp 98.2°F | Wt 237.0 lb

## 2013-12-29 DIAGNOSIS — Z853 Personal history of malignant neoplasm of breast: Secondary | ICD-10-CM

## 2013-12-29 DIAGNOSIS — E876 Hypokalemia: Secondary | ICD-10-CM

## 2013-12-29 DIAGNOSIS — J069 Acute upper respiratory infection, unspecified: Secondary | ICD-10-CM

## 2013-12-29 DIAGNOSIS — E039 Hypothyroidism, unspecified: Secondary | ICD-10-CM

## 2013-12-29 DIAGNOSIS — R609 Edema, unspecified: Secondary | ICD-10-CM

## 2013-12-29 HISTORY — DX: Hypokalemia: E87.6

## 2013-12-29 MED ORDER — AZITHROMYCIN 250 MG PO TABS: ORAL_TABLET | ORAL | Status: DC

## 2013-12-29 MED ORDER — METHYLPREDNISOLONE ACETATE 80 MG/ML IJ SUSP
80.0000 mg | Freq: Once | INTRAMUSCULAR | Status: AC
  Administered 2013-12-29: 80 mg via INTRAMUSCULAR

## 2013-12-29 MED ORDER — LEVOTHYROXINE SODIUM 88 MCG PO TABS
88.0000 ug | ORAL_TABLET | Freq: Every day | ORAL | Status: DC
Start: 2013-12-29 — End: 2015-03-19

## 2013-12-29 NOTE — Addendum Note (Signed)
Addended by: Brett Canales on: 12/29/2013 02:19 PM   Modules accepted: Orders

## 2013-12-29 NOTE — Patient Instructions (Signed)
Depo-Medrol 80 mg IM given today. Zithromax Z-PAK prescribed. Continue same medications. Thyroid replacement dose has been increased from 0.075-0.088 mg daily. Return in 2 months for TSH only. Return in October for physical exam. Continue potassium supplement.

## 2013-12-29 NOTE — Progress Notes (Signed)
   Subjective:    Patient ID: Tara [name], female    DOB: 09-19-1955, 58 y.o.   MRN: [id number]  HPI  Patient in today for six-month recheck. Is also followed at the [facility] for history of breast cancer. I started her recently on a potassium supplement because her potassium was 3.3. She takes diuretic for dependent edema. TSH has been in the 3 range for the last couple of readings. I'm going to increase Synthroid to 0.088 mg daily and recheck TSH in 8 weeks. Fasting glucose was 111 we need to watch that. She's not been exercising recently.  She's also come down with an apparent upper respiratory infection that started about a month ago. She had temperature up to 101 and has a lingering cough. No shortness of breath. Continues to have coughing spells several times a day without sputum production.    Review of Systems     Objective:   Physical Exam Skin is warm and dry. Nodes none. Boggy nasal mucosa right nostril. TMs are clear. Neck is supple without thyromegaly. Chest clear to auscultation without rales or wheezing. Cardiac exam regular rate and rhythm. Extremities nonpitting edema       Assessment & Plan:  History of breast cancer-followed at [place]  Protracted upper respiratory infection-Depo-Medrol 80 mg IM. Zithromax Z-Pak take 2 tablets day one followed by 1 tablet days 2 through 5  Hypothyroidism-thyroid replacement medication adjusted and she will return in 2 months for TSH only  Hypokalemia-now on potassium supplement and recent potassium is now normal  Borderline fasting glucose-continue to monitor  Obesity-encouraged diet and exercise  Plan: Return in October for physical examination

## 2014-01-11 ENCOUNTER — Other Ambulatory Visit: Payer: Self-pay | Admitting: Internal Medicine

## 2014-01-19 ENCOUNTER — Other Ambulatory Visit: Payer: Self-pay | Admitting: Physician Assistant

## 2014-01-19 ENCOUNTER — Telehealth: Payer: Self-pay | Admitting: Oncology

## 2014-01-19 DIAGNOSIS — E876 Hypokalemia: Secondary | ICD-10-CM

## 2014-01-19 NOTE — Telephone Encounter (Signed)
per [name] Beards chgd lab to 6/9 same time/chgd-she will contact pt

## 2014-02-01 ENCOUNTER — Ambulatory Visit (INDEPENDENT_AMBULATORY_CARE_PROVIDER_SITE_OTHER): Payer: BC Managed Care – PPO | Admitting: Internal Medicine

## 2014-02-01 ENCOUNTER — Encounter: Payer: Self-pay | Admitting: Internal Medicine

## 2014-02-01 VITALS — BP 100/72 | HR 80 | Temp 97.9°F | Wt 228.0 lb

## 2014-02-01 DIAGNOSIS — J069 Acute upper respiratory infection, unspecified: Secondary | ICD-10-CM

## 2014-02-01 MED ORDER — PREDNISONE 10 MG PO TABS: ORAL_TABLET | ORAL | Status: DC

## 2014-02-01 MED ORDER — LEVOFLOXACIN 500 MG PO TABS: 500.0000 mg | ORAL_TABLET | Freq: Every day | ORAL | Status: DC

## 2014-02-01 NOTE — Patient Instructions (Signed)
Take Levaquin 500 mg daily with a meal. Take prednisone as directed in tapering course over 6 days.

## 2014-02-05 ENCOUNTER — Other Ambulatory Visit: Payer: BC Managed Care – PPO

## 2014-02-06 ENCOUNTER — Other Ambulatory Visit (HOSPITAL_BASED_OUTPATIENT_CLINIC_OR_DEPARTMENT_OTHER): Payer: BC Managed Care – PPO

## 2014-02-06 DIAGNOSIS — E876 Hypokalemia: Secondary | ICD-10-CM

## 2014-02-06 DIAGNOSIS — C50419 Malignant neoplasm of upper-outer quadrant of unspecified female breast: Secondary | ICD-10-CM

## 2014-02-06 DIAGNOSIS — Z853 Personal history of malignant neoplasm of breast: Secondary | ICD-10-CM

## 2014-02-06 LAB — COMPREHENSIVE METABOLIC PANEL (CC13)
ALT: 46 U/L (ref 0–55)
AST: 26 U/L (ref 5–34)
Albumin: 3.7 g/dL (ref 3.5–5.0)
Alkaline Phosphatase: 68 U/L (ref 40–150)
Anion Gap: 12 mEq/L — ABNORMAL HIGH (ref 3–11)
BUN: 15 mg/dL (ref 7.0–26.0)
CO2: 30 mEq/L — ABNORMAL HIGH (ref 22–29)
Calcium: 8.7 mg/dL (ref 8.4–10.4)
Chloride: 102 mEq/L (ref 98–109)
Creatinine: 0.9 mg/dL (ref 0.6–1.1)
Glucose: 129 mg/dl (ref 70–140)
Potassium: 3.6 mEq/L (ref 3.5–5.1)
Sodium: 143 mEq/L (ref 136–145)
Total Bilirubin: 0.75 mg/dL (ref 0.20–1.20)
Total Protein: 7.4 g/dL (ref 6.4–8.3)

## 2014-02-10 ENCOUNTER — Other Ambulatory Visit: Payer: Self-pay | Admitting: Internal Medicine

## 2014-02-15 ENCOUNTER — Other Ambulatory Visit: Payer: Self-pay

## 2014-02-15 ENCOUNTER — Other Ambulatory Visit: Payer: Self-pay | Admitting: Internal Medicine

## 2014-02-15 MED ORDER — MONTELUKAST SODIUM 10 MG PO TABS: 10.0000 mg | ORAL_TABLET | Freq: Every day | ORAL | Status: DC

## 2014-02-15 MED ORDER — DIGOXIN 250 MCG PO TABS: 0.2500 mg | ORAL_TABLET | Freq: Every day | ORAL | Status: DC

## 2014-02-20 ENCOUNTER — Telehealth: Payer: Self-pay | Admitting: Oncology

## 2014-02-20 NOTE — Telephone Encounter (Signed)
r/s pt per GM-Gm on PAL-will call pt to adv of time & date of new appt

## 2014-02-21 ENCOUNTER — Other Ambulatory Visit: Payer: Self-pay | Admitting: Internal Medicine

## 2014-02-21 ENCOUNTER — Telehealth: Payer: Self-pay | Admitting: Oncology

## 2014-02-21 NOTE — Telephone Encounter (Signed)
cld & left pt message of r/s appt time & date-mailed pt copy of sch

## 2014-02-23 ENCOUNTER — Other Ambulatory Visit: Payer: BC Managed Care – PPO | Admitting: Internal Medicine

## 2014-02-23 DIAGNOSIS — E039 Hypothyroidism, unspecified: Secondary | ICD-10-CM

## 2014-02-24 LAB — TSH: TSH: 1.433 u[IU]/mL (ref 0.350–4.500)

## 2014-02-24 NOTE — Progress Notes (Signed)
   Subjective:    Patient ID: Tara [name], female    DOB: [birth year], 58 y.o.   MRN: [id number]  HPI  Patient was here May 1,2015 for complete physical examination and was complaining of an acute respiratory infection. Was treated with Zithromax and Depo-Medrol IM. Says she improved but did not completely get better. She's back nail think she has cough with slight discolored sputum at times. No wheezing. No fever.    Review of Systems     Objective:   Physical Exam HEENT exam: TMs and pharynx are clear. Neck is supple without adenopathy. Chest clear to auscultation.        Assessment & Plan:  Protracted upper respiratory infection  Plan: Levaquin 500 milligrams daily for 7 days. Sterapred DS 10 mg 6 day dosepak take as directed  15 minutes spent with patient

## 2014-03-06 NOTE — Patient Instructions (Addendum)
Labs checked. May try over-the-counter magnesium supplement for nocturnal leg cramps Call if bruise on hand does not resolve.

## 2014-03-13 ENCOUNTER — Telehealth: Payer: Self-pay | Admitting: Internal Medicine

## 2014-03-13 NOTE — Telephone Encounter (Signed)
Yes she needs to continue potassium because she is on a diuretic Torsemide.

## 2014-03-13 NOTE — Telephone Encounter (Signed)
Patient informed. 

## 2014-03-17 ENCOUNTER — Other Ambulatory Visit: Payer: Self-pay | Admitting: Internal Medicine

## 2014-04-11 ENCOUNTER — Other Ambulatory Visit: Payer: Self-pay

## 2014-04-11 DIAGNOSIS — E876 Hypokalemia: Secondary | ICD-10-CM

## 2014-04-11 MED ORDER — POTASSIUM CHLORIDE ER 20 MEQ PO TBCR: 20.0000 meq | EXTENDED_RELEASE_TABLET | Freq: Every day | ORAL | Status: DC

## 2014-04-11 NOTE — Telephone Encounter (Signed)
Sent e-scrip for potassium to [facility].  Receipt confirmed.

## 2014-04-20 ENCOUNTER — Encounter: Payer: Self-pay | Admitting: Podiatrist

## 2014-04-20 ENCOUNTER — Ambulatory Visit (INDEPENDENT_AMBULATORY_CARE_PROVIDER_SITE_OTHER): Payer: BC Managed Care – PPO | Admitting: Podiatrist

## 2014-04-20 VITALS — BP 161/77 | HR 70 | Resp 18

## 2014-04-20 DIAGNOSIS — M76822 Posterior tibial tendinitis, left leg: Secondary | ICD-10-CM

## 2014-04-20 DIAGNOSIS — Q6650 Congenital pes planus, unspecified foot: Secondary | ICD-10-CM

## 2014-04-20 DIAGNOSIS — Q6652 Congenital pes planus, left foot: Secondary | ICD-10-CM

## 2014-04-20 DIAGNOSIS — Q828 Other specified congenital malformations of skin: Secondary | ICD-10-CM

## 2014-04-20 DIAGNOSIS — M76829 Posterior tibial tendinitis, unspecified leg: Secondary | ICD-10-CM

## 2014-04-20 DIAGNOSIS — M216X9 Other acquired deformities of unspecified foot: Secondary | ICD-10-CM

## 2014-04-20 MED ORDER — CLOTRIMAZOLE-BETAMETHASONE 1-0.05 % EX CREA: 1.0000 "application " | TOPICAL_CREAM | Freq: Two times a day (BID) | CUTANEOUS | Status: DC

## 2014-04-20 NOTE — Progress Notes (Signed)
   Subjective:    Patient ID: Tara [name], female    DOB: 04-Jul-1956, 58 y.o.   MRN: [id number]  HPI I HAVE SOME CALLUSES I NEED TRIMMED AND IT HAS BEEN GOING ON FOR ABOUT A MONTH AND HURTS WITH PRESSURE AND GOES BAREFOOT AND I WOULD LIKE TO TALK TO HER ABOUT INSERTS    Review of Systems  Constitutional: Positive for fatigue.  HENT:       Ringing in ears  Cardiovascular:       Swelling        Objective:   Physical Exam Patient is awake, alert, and oriented x 3.  In no acute distress.  Vascular status is intact with palpable pedal pulses at 2/4 DP and PT bilateral and capillary refill time within normal limits. Neurological sensation is also intact bilaterally via Semmes Weinstein monofilament at 5/5 sites. Light touch, vibratory sensation, Achilles tendon reflex is intact. Dermatological exam reveals skin color, turger and texture as normal. No open lesions present.  Musculature intact with dorsiflexion, plantarflexion, inversion, eversion. Pes planus deformity is present. A previous flatfoot reconstruction is subjectively relayed. It appears the posterior tibial tendon reconstruction was also performed. She has pes planus deformity of the left foot and prominent plantarflexed first metatarsal seen. A large hyperkeratotic lesion is present on the plantar aspect of the left first metatarsal head as well as the medial aspect of the left hallux. Intact integument is present post debridement. She also has a rash on the top of the left foot which appears to be a dermatitis-type of reaction.       Assessment & Plan:  Posterior tibial tendinitis with flat foot deformity left, callus submetatarsal one and left hallux, rash dorsal aspect left foot Plan: Called in Lotrisone cream to her pharmacy. Debrided the calluses with a 15 blade without complication and scan for orthotics was performed today. I will see her back when orthotics are ready for pick up and she will call if any problems or concerns  arise.

## 2014-05-11 ENCOUNTER — Ambulatory Visit: Payer: BC Managed Care – PPO

## 2014-05-11 DIAGNOSIS — Q6652 Congenital pes planus, left foot: Secondary | ICD-10-CM

## 2014-05-11 NOTE — Patient Instructions (Signed)

## 2014-05-11 NOTE — Progress Notes (Signed)
Pt is here to [facility]O 

## 2014-06-25 ENCOUNTER — Other Ambulatory Visit (INDEPENDENT_AMBULATORY_CARE_PROVIDER_SITE_OTHER): Payer: BC Managed Care – PPO | Admitting: Internal Medicine

## 2014-06-25 DIAGNOSIS — Z23 Encounter for immunization: Secondary | ICD-10-CM

## 2014-06-25 DIAGNOSIS — Z130 Encounter for screening for diseases of the blood and blood-forming organs and certain disorders involving the immune mechanism: Secondary | ICD-10-CM

## 2014-06-25 DIAGNOSIS — Z1321 Encounter for screening for nutritional disorder: Secondary | ICD-10-CM

## 2014-06-25 DIAGNOSIS — Z13220 Encounter for screening for lipoid disorders: Secondary | ICD-10-CM

## 2014-06-25 DIAGNOSIS — E038 Other specified hypothyroidism: Secondary | ICD-10-CM

## 2014-06-25 DIAGNOSIS — Z1329 Encounter for screening for other suspected endocrine disorder: Secondary | ICD-10-CM

## 2014-06-25 DIAGNOSIS — C50919 Malignant neoplasm of unspecified site of unspecified female breast: Secondary | ICD-10-CM

## 2014-06-25 DIAGNOSIS — Z0000 Encounter for general adult medical examination without abnormal findings: Secondary | ICD-10-CM

## 2014-06-25 LAB — CBC WITH DIFFERENTIAL/PLATELET
Basophils Absolute: 0 10*3/uL (ref 0.0–0.1)
Basophils Relative: 1 % (ref 0–1)
Eosinophils Absolute: 0.3 10*3/uL (ref 0.0–0.7)
Eosinophils Relative: 6 % — ABNORMAL HIGH (ref 0–5)
HCT: 37.7 % (ref 36.0–46.0)
Hemoglobin: 13 g/dL (ref 12.0–15.0)
Lymphocytes Relative: 18 % (ref 12–46)
Lymphs Abs: 0.8 10*3/uL (ref 0.7–4.0)
MCH: 31.9 pg (ref 26.0–34.0)
MCHC: 34.5 g/dL (ref 30.0–36.0)
MCV: 92.4 fL (ref 78.0–100.0)
Monocytes Absolute: 0.4 10*3/uL (ref 0.1–1.0)
Monocytes Relative: 9 % (ref 3–12)
Neutro Abs: 2.9 10*3/uL (ref 1.7–7.7)
Neutrophils Relative %: 66 % (ref 43–77)
Platelets: 150 10*3/uL (ref 150–400)
RBC: 4.08 MIL/uL (ref 3.87–5.11)
RDW: 13.6 % (ref 11.5–15.5)
WBC: 4.4 10*3/uL (ref 4.0–10.5)

## 2014-06-25 LAB — LIPID PANEL
Cholesterol: 167 mg/dL (ref 0–200)
HDL: 44 mg/dL (ref >39)
LDL Cholesterol: 95 mg/dL (ref 0–99)
Total CHOL/HDL Ratio: 3.8 Ratio
Triglycerides: 139 mg/dL (ref <150)
VLDL: 28 mg/dL (ref 0–40)

## 2014-06-25 LAB — COMPREHENSIVE METABOLIC PANEL
ALT: 39 U/L — ABNORMAL HIGH (ref 0–35)
AST: 36 U/L (ref 0–37)
Albumin: 4.1 g/dL (ref 3.5–5.2)
Alkaline Phosphatase: 59 U/L (ref 39–117)
BUN: 9 mg/dL (ref 6–23)
CO2: 27 mEq/L (ref 19–32)
Calcium: 8.8 mg/dL (ref 8.4–10.5)
Chloride: 102 mEq/L (ref 96–112)
Creat: 0.69 mg/dL (ref 0.50–1.10)
Glucose, Bld: 109 mg/dL — ABNORMAL HIGH (ref 70–99)
Potassium: 4 mEq/L (ref 3.5–5.3)
Sodium: 142 mEq/L (ref 135–145)
Total Bilirubin: 0.8 mg/dL (ref 0.2–1.2)
Total Protein: 6.8 g/dL (ref 6.0–8.3)

## 2014-06-25 LAB — TSH: TSH: 1.818 u[IU]/mL (ref 0.350–4.500)

## 2014-06-26 ENCOUNTER — Ambulatory Visit (INDEPENDENT_AMBULATORY_CARE_PROVIDER_SITE_OTHER): Payer: BC Managed Care – PPO | Admitting: Internal Medicine

## 2014-06-26 ENCOUNTER — Encounter: Payer: Self-pay | Admitting: Internal Medicine

## 2014-06-26 VITALS — BP 112/68 | HR 65 | Temp 97.0°F | Ht 66.5 in | Wt 239.0 lb

## 2014-06-26 DIAGNOSIS — E669 Obesity, unspecified: Secondary | ICD-10-CM

## 2014-06-26 DIAGNOSIS — Z96643 Presence of artificial hip joint, bilateral: Secondary | ICD-10-CM

## 2014-06-26 DIAGNOSIS — F329 Major depressive disorder, single episode, unspecified: Secondary | ICD-10-CM

## 2014-06-26 DIAGNOSIS — F419 Anxiety disorder, unspecified: Secondary | ICD-10-CM

## 2014-06-26 DIAGNOSIS — F418 Other specified anxiety disorders: Secondary | ICD-10-CM

## 2014-06-26 DIAGNOSIS — Z853 Personal history of malignant neoplasm of breast: Secondary | ICD-10-CM

## 2014-06-26 DIAGNOSIS — R609 Edema, unspecified: Secondary | ICD-10-CM

## 2014-06-26 DIAGNOSIS — Z862 Personal history of diseases of the blood and blood-forming organs and certain disorders involving the immune mechanism: Secondary | ICD-10-CM

## 2014-06-26 DIAGNOSIS — Z872 Personal history of diseases of the skin and subcutaneous tissue: Secondary | ICD-10-CM

## 2014-06-26 DIAGNOSIS — E039 Hypothyroidism, unspecified: Secondary | ICD-10-CM

## 2014-06-26 DIAGNOSIS — Z8619 Personal history of other infectious and parasitic diseases: Secondary | ICD-10-CM

## 2014-06-26 DIAGNOSIS — Z0000 Encounter for general adult medical examination without abnormal findings: Secondary | ICD-10-CM

## 2014-06-26 DIAGNOSIS — F32A Depression, unspecified: Secondary | ICD-10-CM

## 2014-06-26 DIAGNOSIS — Z9660 Presence of unspecified orthopedic joint implant: Secondary | ICD-10-CM

## 2014-06-26 DIAGNOSIS — R7989 Other specified abnormal findings of blood chemistry: Secondary | ICD-10-CM

## 2014-06-26 DIAGNOSIS — R945 Abnormal results of liver function studies: Secondary | ICD-10-CM

## 2014-06-26 LAB — POCT URINALYSIS DIPSTICK
Bilirubin, UA: NEGATIVE
Blood, UA: NEGATIVE
Glucose, UA: NEGATIVE
Ketones, UA: NEGATIVE
Leukocytes, UA: NEGATIVE
Nitrite, UA: NEGATIVE
Protein, UA: NEGATIVE
Spec Grav, UA: 1.01
Urobilinogen, UA: NEGATIVE
pH, UA: 6.5

## 2014-06-26 LAB — VITAMIN D 25 HYDROXY (VIT D DEFICIENCY, FRACTURES): Vit D, 25-Hydroxy: 57 ng/mL (ref 30–89)

## 2014-06-26 NOTE — Progress Notes (Signed)
Subjective:    Patient ID: Tara [name], female    DOB: 03/10/1956, 58 y.o.   MRN: [id number]  HPI  58 year old for health maintenance  and evaluation of medical issues. Has had Pap last year and stable bone density study 2014 on calcium and Vitamin D although she is concerned about loss of height. Has appt with Oncology soon.   First presented to the office October 2013. Referred by Dr. Jana Hakim. She found a mass in her left breast while residing in [place] in 2012. Mammogram showed suspicious abnormality in the left breast measuring about 10 mm and physical exam showed a 1 cm firm superficial mass in the axillary region of the breast. Biopsy performed October 2012 at [facility]. She had invasive ductal carcinoma grade 3, estrogen receptor positive, progesterone receptor positive. Tumor was HERS-2 positive  with a ratio of 2.5. Subsequently had definitive left lumpectomy and sentinel node sampling on December heard 2012. 2 sentinel nodes were negative for tumor. Decision was made to proceed with 4 cycles of adjuvant chemotherapy consisting of docetaxel and cyclophosphamide given with trastuzumab every 3 weeks with Neulasta on day 2 for granulocyte support. Trastuzumab wants to be continued for 1 year. Chemotherapy was started here in [place] in 2013 as she moved here from [place]. She developed cellulitis in her left breast and was treated with Keflex. She has done well with no significant peripheral neuropathy. She was treated with radiation therapy here. She is now on anastrozole as an antiestrogen.  She has a history of obesity, hypothyroidism, allergic rhinitis, asthma, status post bilateral hip replacements: right 2005 and left 2007, history of TTP, history of hepatitis C. She has a history of depression treated with Celexa. She has a lot of anxiety about her health. She has a history of dependent edema. Has eczema left foot. With regard to TTP, she was 58 years of age when she  had it. She did not have splenectomy. She had a recurrence at age 3 while being treated with interferon for hepatitis C.  History of left arm lymphedema. History of fractured left elbow with pins placed. Left foot reconstruction 2008.  Family history: Mother with history of skin cancer but patient not sure what type. Father died of COPD complications. Older sister and younger brother healthy. Family history of breast cancer in the immediate family. No new changes with family history according to patient    Review of Systems  Constitutional: Negative.   HENT: Negative.   Eyes:       Wears glasses  Cardiovascular: Negative for chest pain.       [name]-standing history of dependent edema and prominent varicosities lower extremities  Gastrointestinal: Negative.   Endocrine:       History of hypothyroidism  Genitourinary: Negative.   Allergic/Immunologic:       History of allergic rhinitis and asthma  Neurological: Negative.   Hematological:       Remote history of TTP initially at age 52 and again at age 50 while undergoing interferon treatment  Psychiatric/Behavioral:       History of anxiety and depression       Objective:   Physical Exam  Vitals reviewed. Constitutional: She is oriented to person, place, and time. She appears well-developed and well-nourished. No distress.  HENT:  Head: Normocephalic and atraumatic.  Right Ear: External ear normal.  Left Ear: External ear normal.  Mouth/Throat: Oropharynx is clear and moist. No oropharyngeal exudate.  Eyes: Conjunctivae and EOM  are normal. Pupils are equal, round, and reactive to light. Right eye exhibits no discharge. Left eye exhibits no discharge. No scleral icterus.  Neck: Neck supple. No JVD present. No thyromegaly present.  Cardiovascular: Normal rate, regular rhythm, normal heart sounds and intact distal pulses.   No murmur heard. Pulmonary/Chest: Effort normal and breath sounds normal. She has no wheezes. She has no  rales.  Breasts normal female with fibrocystic changes  Abdominal: Soft. Bowel sounds are normal. She exhibits no distension and no mass. There is no rebound and no guarding.  Genitourinary:  Pap done 2014. Bimanual normal  Musculoskeletal: Normal range of motion.  She has lower extremity edema that is nonpitting. She has prominent varicosities in the  calves that are not tender or red.  Lymphadenopathy:    She has no cervical adenopathy.  Neurological: She is alert and oriented to person, place, and time. She has normal reflexes. She displays normal reflexes. No cranial nerve deficit. Coordination normal.  Skin: Skin is warm and dry. Rash noted. She is not diaphoretic.  Eczematous rash top of left foot  Psychiatric: She has a normal mood and affect. Her behavior is normal. Thought content normal.          Assessment & Plan:  History of breast cancer treated with chemotherapy continues to be followed by oncology and is on maintenance therapy with antiestrogen  Hypothyroidism-TSH stable  Allergic rhinitis-stable  History of asthma  Status post bilateral hip replacements  Eczema left foot treated with clobetasol  Dependent edema lower extremities-stable  Obesity-encourage diet and exercise  History of anxiety depression  History of hepatitis C treated with interferon   Plan: Annual mammogram, Regular oncology Follow up. RTC 6 months for OV and TSH and liver panel. Encouraged diet exercise and weight loss. She has a very slight elevation of one liver function test.

## 2014-06-26 NOTE — Patient Instructions (Signed)
Return in 6 months for office visit TSH and liver panel. Continue regular oncology follow-up. Annual mammogram. Continue same medications.

## 2014-07-02 ENCOUNTER — Other Ambulatory Visit: Payer: BC Managed Care – PPO

## 2014-07-02 ENCOUNTER — Other Ambulatory Visit: Payer: Self-pay | Admitting: Oncology

## 2014-07-02 DIAGNOSIS — E876 Hypokalemia: Secondary | ICD-10-CM

## 2014-07-09 ENCOUNTER — Other Ambulatory Visit: Payer: Self-pay | Admitting: Emergency Medicine

## 2014-07-09 ENCOUNTER — Ambulatory Visit: Payer: BC Managed Care – PPO | Admitting: Oncology

## 2014-07-09 DIAGNOSIS — C50919 Malignant neoplasm of unspecified site of unspecified female breast: Secondary | ICD-10-CM

## 2014-07-10 ENCOUNTER — Other Ambulatory Visit (HOSPITAL_BASED_OUTPATIENT_CLINIC_OR_DEPARTMENT_OTHER): Payer: BC Managed Care – PPO

## 2014-07-10 DIAGNOSIS — C50412 Malignant neoplasm of upper-outer quadrant of left female breast: Secondary | ICD-10-CM

## 2014-07-10 DIAGNOSIS — C50919 Malignant neoplasm of unspecified site of unspecified female breast: Secondary | ICD-10-CM

## 2014-07-10 LAB — CBC WITH DIFFERENTIAL/PLATELET
BASO%: 0.4 % (ref 0.0–2.0)
Basophils Absolute: 0 10*3/uL (ref 0.0–0.1)
EOS%: 4.7 % (ref 0.0–7.0)
Eosinophils Absolute: 0.2 10*3/uL (ref 0.0–0.5)
HCT: 39.3 % (ref 34.8–46.6)
HGB: 12.9 g/dL (ref 11.6–15.9)
LYMPH%: 12.5 % — ABNORMAL LOW (ref 14.0–49.7)
MCH: 31.2 pg (ref 25.1–34.0)
MCHC: 32.8 g/dL (ref 31.5–36.0)
MCV: 95.2 fL (ref 79.5–101.0)
MONO#: 0.4 10*3/uL (ref 0.1–0.9)
MONO%: 7.8 % (ref 0.0–14.0)
NEUT#: 3.6 10*3/uL (ref 1.5–6.5)
NEUT%: 74.6 % (ref 38.4–76.8)
Platelets: 174 10*3/uL (ref 145–400)
RBC: 4.13 10*6/uL (ref 3.70–5.45)
RDW: 13.7 % (ref 11.2–14.5)
WBC: 4.9 10*3/uL (ref 3.9–10.3)
lymph#: 0.6 10*3/uL — ABNORMAL LOW (ref 0.9–3.3)

## 2014-07-10 LAB — COMPREHENSIVE METABOLIC PANEL (CC13)
ALT: 34 U/L (ref 0–55)
AST: 30 U/L (ref 5–34)
Albumin: 3.7 g/dL (ref 3.5–5.0)
Alkaline Phosphatase: 65 U/L (ref 40–150)
Anion Gap: 10 mEq/L (ref 3–11)
BUN: 10.2 mg/dL (ref 7.0–26.0)
CO2: 27 mEq/L (ref 22–29)
Calcium: 8.9 mg/dL (ref 8.4–10.4)
Chloride: 106 mEq/L (ref 98–109)
Creatinine: 0.9 mg/dL (ref 0.6–1.1)
Glucose: 152 mg/dl — ABNORMAL HIGH (ref 70–140)
Potassium: 3.9 mEq/L (ref 3.5–5.1)
Sodium: 142 mEq/L (ref 136–145)
Total Bilirubin: 0.67 mg/dL (ref 0.20–1.20)
Total Protein: 7.3 g/dL (ref 6.4–8.3)

## 2014-07-17 ENCOUNTER — Telehealth: Payer: Self-pay | Admitting: Oncology

## 2014-07-17 ENCOUNTER — Ambulatory Visit (HOSPITAL_BASED_OUTPATIENT_CLINIC_OR_DEPARTMENT_OTHER): Payer: BC Managed Care – PPO | Admitting: Oncology

## 2014-07-17 VITALS — BP 129/86 | HR 88 | Temp 98.5°F | Resp 18 | Ht 66.5 in | Wt 238.5 lb

## 2014-07-17 DIAGNOSIS — C50412 Malignant neoplasm of upper-outer quadrant of left female breast: Secondary | ICD-10-CM | POA: Insufficient documentation

## 2014-07-17 DIAGNOSIS — Z170 Estrogen receptor positive status [ER+]: Secondary | ICD-10-CM

## 2014-07-17 DIAGNOSIS — C50912 Malignant neoplasm of unspecified site of left female breast: Secondary | ICD-10-CM

## 2014-07-17 HISTORY — DX: Estrogen receptor positive status (ER+): Z17.0

## 2014-07-17 NOTE — Telephone Encounter (Signed)
per pof to sch pt appt-gave pt copy of sch °

## 2014-07-17 NOTE — Progress Notes (Signed)
ID: Tara [name]   DOB: Feb 13, 1956  MR#: [id number]  CXK#:[id number]  PCP:  [name], MD GYN: SU:  Autumn Messing, MD Other:  Delfin Edis, MD;  Arloa Koh, MD;  Glori Bickers, MD  CHIEF COMPLAINT:   Hx of Left Breast Cancer; Here for routine follow up   HISTORY OF PRESENT ILLNESS: [name] noted a mass in her left breast and brought it to her physician's attention in [place], where she was residing. She was set up for mammography 06/24/2011 at [facility] in [place], [place]. This showed a suspicious abnormality in the left breast measuring approximately 10 mm. Physical examination showed a 1 cm firm superficial mass in the axillary region of the breast and ultrasound showed this to be 11 mm and spiculated.  Biopsy was performed June 25, 2011, and showed ([id number] at the [facility]) and invasive ductal carcinoma, grade 3, estrogen receptor 95% positive, progesterone receptor 40% positive, with an MIB-1 of 29%. The tumor is HER 2 positive by FISH with a ratio of 2.5. There is tumor heterogeneity noted.  With this information the case was presented Jul 08, 2011 at the multidisciplinary  breast cancer clinic. She proceeded to definitive left lumpectomy and sentinel lymph node sampling 08/03/2011. This confirmed a 1.6 cm invasive ductal carcinoma, grade 3. Margins were ample. The two sentinel lymph nodes were clear.  Her subsequent history is as detailed below.  INTERVAL HISTORY: [name] returns today for follow-up of herestrogen receptor positive breast cancer. She is tolerating letrozole well. She has minimal hot flashes. She does have some vaginal dryness issues. She never developed the arthralgias/myalgias that one and 5 patients taking this medication can experience   REVIEW OF SYSTEMS: [name] walks her dog most days. She is not going to the gym however [name] says she goes to the rush". She has developed some right ankle pain which is worse after she has been  lying down and suddenly gets up.the left ankle "is also not right". Aside from these issues, a detailed review of systems today was noncontributory  PAST MEDICAL HISTORY: Past Medical History  Diagnosis Date  . Thrombocytopenia, primary   . Asthma   . Arthritis   . Complication of anesthesia     difficulty waking up/dizzy/lightheaded  . Environmental allergies   . Shortness of breath   . Hypothyroidism   . Blood transfusion   . Anxiety   . Dysrhythmia     irregular heartbeat, takes digoxin  . Chronic edema   . Breast cancer 2012    left breast  . Hx of radiation therapy 12/15/11 - 01/29/12    left breast  . H/O varicella   . History of measles, mumps, or rubella   . TTP (thrombotic thrombocytopenic purpura)   . Hepatitis C   . H/O varicose veins    PAST SURGICAL HISTORY: Past Surgical History  Procedure Laterality Date  . Elbow pins Left   . Total hip arthroplasty Bilateral   . Foot surgery Left     multiple  . Portacath placement  08/03/2011    Procedure: INSERTION PORT-A-CATH;  Surgeon: Merrie Roof, MD;  Location: [place];  Service: General;  Laterality: Right;  . Breast lumpectomy  08/03/11    left lumpectomy and slnbx,T1cN0,triple pos    FAMILY HISTORY Family History  Problem Relation Age of Onset  . Brain cancer Maternal Grandfather   . Alcohol abuse Maternal Grandfather   . Heart disease Neg Hx   . Hyperlipidemia Neg  Hx   . Hypertension Neg Hx   . COPD Father   Patient's father died at the age of 79 from emphysema in the setting of tobacco abuse; patient's mother is alive. The patient has one brother and one sister; there is no history of breast or ovarian cancer in the immediate family   Gynecologic history: She is [place] P0 menarche age 16 most recent period July of 2008 she never took hormone replacement   Social History: (Updated 12/04/2012) The patient is a homemaker; she used to work in an office previously. Her husband [name] is a Scientist, research (physical sciences), but currently  working a temporary job in [place]. They moved here from [facility] Oct 2012. Her dog's name is Eduard Clos (a dachshund/chihuahua mix)   ADVANCED DIRECTIVES: her husband is her healthcare power of attorney  HEALTH MAINTENANCE: (Updated 12/04/2012) History  Substance Use Topics  . Smoking status: Never Smoker   . Smokeless tobacco: Never Used  . Alcohol Use: 0.6 oz/week    1 Glasses of wine per week     Colonoscopy: Signoidoscopy Aug 2014, Dr. Olevia Perches  PAP: Oct 2014, Dr. Renold Genta  Bone density: November 2014, [facility], Normal  Lipid panel: Oct. 2014, Dr. Renold Genta   Allergies  Allergen Reactions  . Aspirin     Hx of TTP  . Nsaids     Hx TTP    Current Outpatient Prescriptions  Medication Sig Dispense Refill  . acetaminophen (TYLENOL) 500 MG tablet Take 500 mg by mouth every 6 (six) hours as needed. For pain     . albuterol (PROVENTIL HFA;VENTOLIN HFA) 108 (90 BASE) MCG/ACT inhaler Inhale 2 puffs into the lungs every 6 (six) hours as needed. For shortness of breath     . budesonide (PULMICORT FLEXHALER) 180 MCG/ACT inhaler Inhale 1 puff into the lungs 2 (two) times daily. 3 Inhaler 3  . cholecalciferol (VITAMIN D) 1000 UNITS tablet Take 3,000 Units by mouth daily.     . citalopram (CELEXA) 10 MG tablet TAKE 1 TABLET BY MOUTH EVERY DAY 90 tablet 3  . clotrimazole-betamethasone (LOTRISONE) cream Apply 1 application topically 2 (two) times daily. 45 g 2  . DIGOX 250 MCG tablet TAKE 1 TABLET BY MOUTH EVERY DAY 90 tablet 1  . digoxin (LANOXIN) 0.25 MG tablet Take 1 tablet (0.25 mg total) by mouth daily. 90 tablet 3  . fluticasone (FLONASE) 50 MCG/ACT nasal spray Place 2 sprays into the nose daily. 1 g 0  . fluticasone (FLOVENT HFA) 44 MCG/ACT inhaler Inhale 2 puffs into the lungs every 12 (twelve) hours. 1 Inhaler 12  . glucosamine-chondroitin 500-400 MG tablet Take 1 tablet by mouth daily.     Marland Kitchen letrozole (FEMARA) 2.5 MG tablet Take 1 tablet (2.5 mg total) by mouth daily. 90 tablet 3  .  levothyroxine (SYNTHROID, LEVOTHROID) 88 MCG tablet Take 1 tablet (88 mcg total) by mouth daily. 90 tablet 3  . loperamide (IMODIUM A-D) 2 MG tablet Take 2 mg by mouth 4 (four) times daily as needed. For diarrhea     . Loratadine (CLARITIN) 10 MG CAPS Take by mouth as needed.    . montelukast (SINGULAIR) 10 MG tablet Take 1 tablet (10 mg total) by mouth at bedtime. 90 tablet 3  . Nutritional Supplements (OSTEO ADVANCE) TABS Take by mouth.    Marland Kitchen OVER THE COUNTER MEDICATION Take 1 tablet by mouth daily as needed. For gas pain    . pimecrolimus (ELIDEL) 1 % cream Apply 1 application topically 2 (two) times daily as  needed. For exczema     . Potassium Chloride ER 20 MEQ TBCR TAKE 1 TABLET BY MOUTH DAILY 30 tablet 2  . torsemide (DEMADEX) 20 MG tablet TAKE 1 TABLET BY MOUTH DAILY 30 tablet 11  . vitamin B-12 (CYANOCOBALAMIN) 100 MCG tablet Take 2,000 mcg by mouth daily.     . Vitamin D, Ergocalciferol, (DRISDOL) 50000 UNITS CAPS capsule Take 50,000 Units by mouth every 7 (seven) days.    . WELCHOL 625 MG tablet TAKE 2 TABLETS BY MOUTH EVERY DAY 60 tablet 1  . [DISCONTINUED] potassium chloride SA (K-DUR,KLOR-CON) 20 MEQ tablet Take 1 tablet (20 mEq total) by mouth 2 (two) times daily. 90 tablet 1   No current facility-administered medications for this visit.    OBJECTIVE: Middle-aged white woman in no acute distress  Filed Vitals:   07/17/14 1044  BP: 129/86  Pulse: 88  Temp: 98.5 F (36.9 C)  Resp: 18     Body mass index is 37.92 kg/(m^2).    ECOG FS: 1 Filed Weights   07/17/14 1044  Weight: 238 lb 8 oz (108.183 kg)   Sclerae unicteric, pupils equal and reactive Oropharynx clear and moist-- teeth undergoing repair No cervical or supraclavicular adenopathy Lungs no rales or rhonchi Heart regular rate and rhythm Abd soft, obese,nontender, positive bowel sounds MSK no focal spinal tenderness, no upper extremity lymphedema Neuro: nonfocal, well oriented, positive affect Breasts: the right  breast is unremarkable. The left breast is status post lumpectomy and radiation. There is no evidence of local recurrence. The right axilla is benign    LAB RESULTS: Lab Results  Component Value Date   WBC 4.9 07/10/2014   NEUTROABS 3.6 07/10/2014   HGB 12.9 07/10/2014   HCT 39.3 07/10/2014   MCV 95.2 07/10/2014   PLT 174 07/10/2014      Chemistry      Component Value Date/Time   NA 142 07/10/2014 1050   NA 142 06/25/2014 0932   K 3.9 07/10/2014 1050   K 4.0 06/25/2014 0932   CL 102 06/25/2014 0932   CL 108* 12/05/2012 0953   CO2 27 07/10/2014 1050   CO2 27 06/25/2014 0932   BUN 10.2 07/10/2014 1050   BUN 9 06/25/2014 0932   CREATININE 0.9 07/10/2014 1050   CREATININE 0.69 06/25/2014 0932   CREATININE 0.70 04/18/2012 1005      Component Value Date/Time   CALCIUM 8.9 07/10/2014 1050   CALCIUM 8.8 06/25/2014 0932   ALKPHOS 65 07/10/2014 1050   ALKPHOS 59 06/25/2014 0932   AST 30 07/10/2014 1050   AST 36 06/25/2014 0932   ALT 34 07/10/2014 1050   ALT 39* 06/25/2014 0932   BILITOT 0.67 07/10/2014 1050   BILITOT 0.8 06/25/2014 0932      STUDIES: Mammography at [facility] 07/16/2014 was reportedly benign.   ASSESSMENT: 58 y.o.  [place] woman originally from [place]   1. Status post left lumpectomy and sentinel lymph node sampling 08/03/2011 for a T1c N0 (Stage IA) invasive ductal carcinoma, high-grade, triple positive.   2. treated in the adjuvant setting with 4 doses of docetaxel and cyclophosphamide completed 11/16/2011 given along with trastuzumab  3. Trastuzumab continued to total of one year (to December 2013)  4. Status post radiation therapy, completed late May of 2013.  5. began on anastrozole in early June 2013. Discontinued in September 2013 due to in tolerance (rash). Started on letrozole in October 2013.  (a) DEXA scan at [facility] 07/17/2013 was normal  7. Comorbidities include  a history of remote thrombotic thrombocytopenic purpura and hepatitis C.     PLAN: Crook now 3 years out from her definitive surgery with no evidence of disease recurrence. She has completed 2 years of anti-estrogens, and the plan of course is to continue letrozole for a total of 5 years, which will take [name] to October 2018.  Thankfully there has been no recurrence of her thrombotic thrombocytopenic purpura  I recommended 45 minutes of exercise daily 5 days a week. Luckily her dog insists on being taking for a walk, which is helpful. She also belongs to a gym although she has not been going recently.  She has what looks like eczema on the dorsum of her left foot. I suggested she wear a white cotton sock in case there is no element of contact dermatitis, and use Cortaid twice daily. If those mild interventions don't work she will bring this up to her primary care physician at her upcoming visit.  Janice will see me again in one year per she knows to call for any problems that may develop before her next visit.  Chauncey Cruel, MD    07/17/2014

## 2014-08-09 ENCOUNTER — Encounter: Payer: Self-pay | Admitting: Oncology

## 2014-08-21 ENCOUNTER — Telehealth: Payer: Self-pay | Admitting: Internal Medicine

## 2014-08-21 ENCOUNTER — Encounter: Payer: Self-pay | Admitting: Internal Medicine

## 2014-08-21 MED ORDER — BUDESONIDE 180 MCG/ACT IN AEPB: 1.0000 | INHALATION_SPRAY | Freq: Two times a day (BID) | RESPIRATORY_TRACT | Status: DC

## 2014-08-21 NOTE — Telephone Encounter (Signed)
Refill Pulmicort inhaler 1 unit one puff twice a day to [facility]	[place] for with when necessary one year refills

## 2014-08-22 ENCOUNTER — Telehealth: Payer: Self-pay | Admitting: *Deleted

## 2014-08-22 NOTE — Telephone Encounter (Signed)
Received prior authorization notice from patient pharmacy on Pulmicort called patient left message for her to call back to get further information

## 2014-08-29 NOTE — Telephone Encounter (Signed)
No return call from patient at this time

## 2014-09-03 ENCOUNTER — Telehealth: Payer: Self-pay | Admitting: *Deleted

## 2014-09-03 NOTE — Telephone Encounter (Signed)
Patient returned call she states she was not using Pulmicort daily so just recently ran out I asked patient about Flovent 50 which is on her list she states she must have used that in the past but not at this time. Patient has not established an allergist or pulmonologist for treatment of her asthma.will consult with Dr Renold Genta for recommendations

## 2014-09-03 NOTE — Telephone Encounter (Signed)
Left message regarding insurance denial for Pulmicort need more information from patient to determine what she has tried in the past

## 2014-09-04 MED ORDER — FLUTICASONE-SALMETEROL 250-50 MCG/DOSE IN AEPB: 1.0000 | INHALATION_SPRAY | Freq: Two times a day (BID) | RESPIRATORY_TRACT | Status: DC

## 2014-09-04 NOTE — Telephone Encounter (Signed)
Patient medication changed from Pulmicort to advair inhaler script sent to pharmacy patient notified

## 2014-09-25 ENCOUNTER — Encounter: Payer: Self-pay | Admitting: Internal Medicine

## 2014-09-25 ENCOUNTER — Ambulatory Visit (INDEPENDENT_AMBULATORY_CARE_PROVIDER_SITE_OTHER): Payer: BLUE CROSS/BLUE SHIELD | Admitting: Internal Medicine

## 2014-09-25 VITALS — BP 120/80 | HR 81 | Temp 98.6°F | Wt 239.0 lb

## 2014-09-25 DIAGNOSIS — T148 Other injury of unspecified body region: Secondary | ICD-10-CM

## 2014-09-25 DIAGNOSIS — R0781 Pleurodynia: Secondary | ICD-10-CM

## 2014-09-25 DIAGNOSIS — T148XXA Other injury of unspecified body region, initial encounter: Secondary | ICD-10-CM

## 2014-09-25 MED ORDER — CYCLOBENZAPRINE HCL 10 MG PO TABS: ORAL_TABLET | ORAL | Status: DC

## 2014-09-25 MED ORDER — HYDROCODONE-ACETAMINOPHEN 10-325 MG PO TABS: 1.0000 | ORAL_TABLET | Freq: Two times a day (BID) | ORAL | Status: DC

## 2014-09-25 NOTE — Patient Instructions (Signed)
Handicap parking permit signed. Order given for shingles vaccine. Try Flexeril one half tablet at bedtime. Try Norco 5/325 twice a day. Continue topical products and heat. Call if not better in 2 weeks.

## 2014-09-25 NOTE — Progress Notes (Signed)
   Subjective:    Patient ID: Tara [name], female    DOB: [birth year], 59 y.o.   MRN: [id number]  HPI  Patient in today with complaint of back pain for the past 2 weeks. She actually is pointing to her left lateral trunk area extending toward her back. Has recently been doing some exercises for stretching using her left upper extremity. It is conceivable she could have strained her muscles that way. Denies any fall or injury. No pain with deep breathing. Seems very worried about this. Also wants a handicap parking permit for [facility]. Has one from out of state [place]. Also requesting Zostavax vaccine. Prescription order given for her to obtain this pharmacy. Patient tried Tylenol PM but that made her quite drowsy the following morning. She does have some left over Percocet from a dental procedure but that also left her feeling quite drowsy. She's been using some over-the-counter pain relief gel and a thermal heat patch. She is asking if these are okay to use.    Review of Systems     Objective:   Physical Exam  Chest is clear to auscultation without rales or wheezing or friction rub. Patient has palpable tenderness left posterior lateral rock along rib cage area      Assessment & Plan:  Musculoskeletal pain secondary to exercising  Plan: Patient is afraid to take inflammatory medication due to history of TTP. Patient will try Norco 5/325 instead of Percocet 1 by mouth twice daily. Try Flexeril one half tablet by mouth daily at bedtime. Continue topical gel and heat patch. Call if not better in 2 weeks. Cut back on exercising for a few days.  Handicap parking permit signed. Prescription written for Zostavax vaccine.

## 2014-10-15 ENCOUNTER — Other Ambulatory Visit: Payer: Self-pay | Admitting: *Deleted

## 2014-10-15 ENCOUNTER — Encounter: Payer: Self-pay | Admitting: Internal Medicine

## 2014-10-15 ENCOUNTER — Telehealth: Payer: Self-pay | Admitting: Internal Medicine

## 2014-10-15 MED ORDER — KLOR-CON M20 20 MEQ PO TBCR: 20.0000 meq | EXTENDED_RELEASE_TABLET | Freq: Once | ORAL | Status: DC

## 2014-10-15 MED ORDER — TORSEMIDE 20 MG PO TABS: 20.0000 mg | ORAL_TABLET | Freq: Every day | ORAL | Status: DC

## 2014-10-15 NOTE — Telephone Encounter (Signed)
Refill torsemide for 6 months. Patient has appointment April 2016.

## 2014-10-19 ENCOUNTER — Ambulatory Visit
Admission: RE | Admit: 2014-10-19 | Discharge: 2014-10-19 | Disposition: A | Discharge status: Home / Self Care | Payer: BLUE CROSS/BLUE SHIELD | Source: Ambulatory Visit | Attending: Internal Medicine | Admitting: Internal Medicine

## 2014-10-19 ENCOUNTER — Ambulatory Visit (INDEPENDENT_AMBULATORY_CARE_PROVIDER_SITE_OTHER): Payer: BLUE CROSS/BLUE SHIELD | Admitting: Internal Medicine

## 2014-10-19 ENCOUNTER — Other Ambulatory Visit: Payer: Self-pay | Admitting: Internal Medicine

## 2014-10-19 ENCOUNTER — Encounter: Payer: Self-pay | Admitting: Internal Medicine

## 2014-10-19 VITALS — BP 108/74 | HR 97 | Temp 97.6°F | Wt 232.5 lb

## 2014-10-19 DIAGNOSIS — R0781 Pleurodynia: Secondary | ICD-10-CM

## 2014-10-19 MED ORDER — PREDNISONE 10 MG PO KIT: PACK | ORAL | Status: DC

## 2014-10-19 NOTE — Progress Notes (Signed)
   Subjective:    Patient ID: Tara [name], female    DOB: 18-Jun-1956, 59 y.o.   MRN: [id number]  HPI  59 year old Female in today for complaint of pain in her left posterior rib cage area. Was seen for this initially January 26 treated with Norco 10/325 and Flexeril. She has a history of breast cancer. This pain has her concerned. She has had no fall or trauma to that area.    Review of Systems     Objective:   Physical Exam  Point tenderness along left posterior rib cage. Chest is clear to auscultation without rales or wheezing or friction rub.      Assessment & Plan:  Acute left posterior rib cage pain  Plan: X-ray of the thoracic spine and ribs detail films.  Addendum: Patient has scoliosis and osteoarthritis of the thoracic spine. Chest x-ray shows left pleural thickening consistent with scarring. Treat with Sterapred DS 10 mg 6 day dosepak. Consider physical therapy.

## 2014-11-28 NOTE — Patient Instructions (Signed)
Take prednisone as directed. Have x-rays of thoracic spine and ribs. Consider physical therapy

## 2014-12-07 ENCOUNTER — Other Ambulatory Visit: Payer: Self-pay | Admitting: Oncology

## 2014-12-10 NOTE — Telephone Encounter (Signed)
Pt to contact primary MD - Dr Tedra Senegal for refills

## 2014-12-11 ENCOUNTER — Other Ambulatory Visit: Payer: Self-pay | Admitting: *Deleted

## 2014-12-11 MED ORDER — KLOR-CON M20 20 MEQ PO TBCR: 20.0000 meq | EXTENDED_RELEASE_TABLET | Freq: Once | ORAL | Status: DC

## 2014-12-11 NOTE — Telephone Encounter (Signed)
Sent refill for Klor con to patient pharmacy

## 2014-12-11 NOTE — Telephone Encounter (Signed)
TCF patient asking for refill for Letrazole 2.5 mg. She wants it filled @ [address] . Phone # is [phone]

## 2014-12-12 ENCOUNTER — Telehealth: Payer: Self-pay | Admitting: *Deleted

## 2014-12-12 DIAGNOSIS — Z853 Personal history of malignant neoplasm of breast: Secondary | ICD-10-CM

## 2014-12-12 DIAGNOSIS — C50912 Malignant neoplasm of unspecified site of left female breast: Secondary | ICD-10-CM

## 2014-12-12 MED ORDER — LETROZOLE 2.5 MG PO TABS: 2.5000 mg | ORAL_TABLET | Freq: Every day | ORAL | Status: DC

## 2014-12-12 NOTE — Telephone Encounter (Signed)
Received VM on triage line from patient requesting refill on Letrozole tablets. Per Dr. Jana Hakim, patient will continue Letrozole through October 2018. Refill sent to patient's pharmacy and patient notified this has been done.    Followup appts for 08/2015 confirmed while on the phone with patient.

## 2014-12-13 ENCOUNTER — Other Ambulatory Visit: Payer: Self-pay | Admitting: *Deleted

## 2014-12-13 NOTE — Telephone Encounter (Signed)
Please refill as requested.

## 2014-12-27 ENCOUNTER — Other Ambulatory Visit: Payer: BLUE CROSS/BLUE SHIELD | Admitting: Internal Medicine

## 2014-12-27 DIAGNOSIS — E039 Hypothyroidism, unspecified: Secondary | ICD-10-CM

## 2014-12-27 LAB — TSH: TSH: 1.79 u[IU]/mL (ref 0.350–4.500)

## 2014-12-28 ENCOUNTER — Ambulatory Visit (INDEPENDENT_AMBULATORY_CARE_PROVIDER_SITE_OTHER): Payer: BLUE CROSS/BLUE SHIELD | Admitting: Internal Medicine

## 2014-12-28 ENCOUNTER — Encounter: Payer: Self-pay | Admitting: Internal Medicine

## 2014-12-28 VITALS — BP 112/74 | HR 71 | Temp 98.0°F | Wt 233.0 lb

## 2014-12-28 DIAGNOSIS — M546 Pain in thoracic spine: Secondary | ICD-10-CM | POA: Diagnosis not present

## 2014-12-28 DIAGNOSIS — E039 Hypothyroidism, unspecified: Secondary | ICD-10-CM | POA: Diagnosis not present

## 2014-12-28 NOTE — Progress Notes (Signed)
   Subjective:    Patient ID: Tara [name], female    DOB: 1956/07/03, 59 y.o.   MRN: [id number]  HPI  In today to follow-up for six-month recheck of hypothyroidism. TSH is within normal limits on 0.088 mg daily. Has been going to physical therapy for left-sided thoracic back pain pain. It has helped a great deal. No back pain at all right nail. However, wants prescription for TENS unit to have on hand should back pain recur. Therapist thinks this could be helpful.  No other new complaints. Feels well.    Review of Systems     Objective:   Physical Exam  No thyromegaly.      Assessment & Plan:  Hypothyroidism-stable on thyroid replacement  Left-sided thoracic back pain-improved with physical therapy  Plan: Prescription for TENS unit written. Return after October 28 for physical examination

## 2014-12-28 NOTE — Patient Instructions (Addendum)
Continue same dose of thyroid replacement and return in 6 months. Prescription for TENS unit written.

## 2015-01-17 ENCOUNTER — Ambulatory Visit (INDEPENDENT_AMBULATORY_CARE_PROVIDER_SITE_OTHER): Payer: BLUE CROSS/BLUE SHIELD

## 2015-01-17 ENCOUNTER — Encounter: Payer: Self-pay | Admitting: Podiatry

## 2015-01-17 ENCOUNTER — Ambulatory Visit (INDEPENDENT_AMBULATORY_CARE_PROVIDER_SITE_OTHER): Payer: BLUE CROSS/BLUE SHIELD | Admitting: Podiatry

## 2015-01-17 ENCOUNTER — Ambulatory Visit: Payer: BLUE CROSS/BLUE SHIELD

## 2015-01-17 VITALS — BP 107/68 | HR 88 | Resp 14 | Ht 66.5 in | Wt 235.0 lb

## 2015-01-17 DIAGNOSIS — M779 Enthesopathy, unspecified: Secondary | ICD-10-CM

## 2015-01-17 DIAGNOSIS — L84 Corns and callosities: Secondary | ICD-10-CM | POA: Diagnosis not present

## 2015-01-17 DIAGNOSIS — M76822 Posterior tibial tendinitis, left leg: Secondary | ICD-10-CM

## 2015-01-17 MED ORDER — TRIAMCINOLONE ACETONIDE 10 MG/ML IJ SUSP
10.0000 mg | Freq: Once | INTRAMUSCULAR | Status: AC
  Administered 2015-01-17: 10 mg

## 2015-01-17 NOTE — Progress Notes (Signed)
   Subjective:    Patient ID: Tara [name], female    DOB: 09-22-1955, 59 y.o.   MRN: [id number]  HPI    Review of Systems  Constitutional: Positive for activity change.  Musculoskeletal: Positive for back pain.  All other systems reviewed and are negative.      Objective:   Physical Exam        Assessment & Plan:

## 2015-01-18 NOTE — Progress Notes (Signed)
Subjective:     Patient ID: Tara [name], female   DOB: 1955/09/19, 59 y.o.   MRN: [id number]  HPI patient presents with pain in her right foot and pain on the top of her left foot. States that the right foot bothers her more and it's been going on for a while   Review of Systems  All other systems reviewed and are negative.      Objective:   Physical Exam  Constitutional: She is oriented to person, place, and time.  Cardiovascular: Intact distal pulses.   Musculoskeletal: Normal range of motion.  Neurological: She is oriented to person, place, and time.  Skin: Skin is warm.  Nursing note and vitals reviewed.  neurovascular status intact muscle strength adequate range of motion the subtalar midtarsal joint within normal limits. Patient's noted to have pain in her right foot plantar heel and moderate discomfort dorsum of the left foot with palpation. Patient has depression of the arch noted and is noted to be well oriented 3 and has good digital perfusion     Assessment:     Probable plantar fasciitis tendinitis right with structural problems as part of this and dorsal tendinitis left    Plan:     Injected the right plantar fashion 3 mg Kenalog 5 mg Xylocaine and applied fascial brace with instructions on physical therapy and scanned for custom orthotics to get her better foot support through her arch. Reappoint one or orthotics returned

## 2015-01-31 ENCOUNTER — Encounter: Payer: Self-pay | Admitting: Internal Medicine

## 2015-01-31 ENCOUNTER — Ambulatory Visit (INDEPENDENT_AMBULATORY_CARE_PROVIDER_SITE_OTHER): Payer: BLUE CROSS/BLUE SHIELD | Admitting: Internal Medicine

## 2015-01-31 VITALS — BP 102/66 | HR 84 | Temp 98.3°F | Ht 67.0 in | Wt 234.0 lb

## 2015-01-31 DIAGNOSIS — G5711 Meralgia paresthetica, right lower limb: Secondary | ICD-10-CM

## 2015-01-31 NOTE — Progress Notes (Signed)
   Subjective:    Patient ID: Tara [name], female    DOB: Jan 28, 1956, 59 y.o.   MRN: [id number]  HPI  Patient is somewhat anxious and became concerned about burning sensation right lateral leg recently. Sometimes she has noticed it at night. No pain just burning sensation right lateral leg. Says her back feels well with no recurrence of pain. No weakness or numbness in right lower extremity. She's been looking on the Internet and became somewhat alarmed about the possibilities. Worried about veins in her leg.    Review of Systems has not had any prolonged standing recently.     Objective:   Physical Exam  Straight leg raising is negative at 90 on the right. Her muscle strength is normal in the right lower extremity. She has some venous insufficiency and trace pitting edema in the right lower extremity       Assessment & Plan:  Right meralgia paresthetica  Plan: Patient was assured this is a benign condition. He can be treated with Neurontin if necessary but I would discourage that at this point. She may call us symptoms persist but hopefully they will resolve soon

## 2015-01-31 NOTE — Patient Instructions (Addendum)
Patient was reassured. Call if symptoms persist.

## 2015-02-05 ENCOUNTER — Other Ambulatory Visit: Payer: Self-pay | Admitting: *Deleted

## 2015-02-05 MED ORDER — DIGOXIN 250 MCG PO TABS: 0.2500 mg | ORAL_TABLET | Freq: Every day | ORAL | Status: DC

## 2015-02-05 MED ORDER — MONTELUKAST SODIUM 10 MG PO TABS: 10.0000 mg | ORAL_TABLET | Freq: Every day | ORAL | Status: DC

## 2015-02-12 ENCOUNTER — Ambulatory Visit: Payer: BLUE CROSS/BLUE SHIELD | Admitting: *Deleted

## 2015-02-12 DIAGNOSIS — M779 Enthesopathy, unspecified: Secondary | ICD-10-CM

## 2015-02-12 NOTE — Patient Instructions (Signed)

## 2015-02-12 NOTE — Progress Notes (Signed)
Patient ID: Tara [name], female   DOB: [birth year], 59 y.o.   MRN: [id number] PICKING UP INSERTS

## 2015-02-18 ENCOUNTER — Other Ambulatory Visit: Payer: Self-pay | Admitting: *Deleted

## 2015-02-18 MED ORDER — MONTELUKAST SODIUM 10 MG PO TABS: 10.0000 mg | ORAL_TABLET | Freq: Every day | ORAL | Status: DC

## 2015-02-18 MED ORDER — DIGOXIN 250 MCG PO TABS: 0.2500 mg | ORAL_TABLET | Freq: Every day | ORAL | Status: DC

## 2015-02-19 ENCOUNTER — Ambulatory Visit (INDEPENDENT_AMBULATORY_CARE_PROVIDER_SITE_OTHER): Payer: BLUE CROSS/BLUE SHIELD | Admitting: Podiatry

## 2015-02-19 ENCOUNTER — Encounter: Payer: Self-pay | Admitting: Podiatry

## 2015-02-19 DIAGNOSIS — M76821 Posterior tibial tendinitis, right leg: Secondary | ICD-10-CM | POA: Diagnosis not present

## 2015-02-19 DIAGNOSIS — T148XXA Other injury of unspecified body region, initial encounter: Secondary | ICD-10-CM

## 2015-02-19 DIAGNOSIS — M76822 Posterior tibial tendinitis, left leg: Secondary | ICD-10-CM

## 2015-02-19 DIAGNOSIS — T148 Other injury of unspecified body region: Secondary | ICD-10-CM

## 2015-02-19 NOTE — Progress Notes (Signed)
Subjective:     Patient ID: Tara [name], female   DOB: [birth year], 59 y.o.   MRN: [id number]  HPI patient presents with continued pain on the inside of the right ankle and stated that several weeks ago she felt like there was a snap when she walked on it. She has a lot of swelling in the area also but always does have consistent edema   Review of Systems     Objective:   Physical Exam Neurovascular status unchanged with discomfort in the posterior tibial tendon as it comes underneath the medial malleolus with a fundamental change in how it feels to her. States it's very tender and is making ambulation difficult    Assessment:     Possibility for a tear of the posterior tibial tendon right with also possibility of an inflammatory complex    Plan:     Reviewed condition and at this time I placed patient in an air fracture walker to immobilize. We may require MRI if symptoms were to persist and this will be made based on the response to immobilization over the next 3 weeks

## 2015-02-28 ENCOUNTER — Telehealth: Payer: Self-pay | Admitting: *Deleted

## 2015-02-28 NOTE — Telephone Encounter (Signed)
That is fine 

## 2015-02-28 NOTE — Telephone Encounter (Addendum)
Pt states she was given a walking boot at last visit one week ago, and would like to know if she could begin using her walking in her shoes.  Dr. Mellody Drown orders to begin orthotic wear in athletic shoes and take it slow, were called to pt.  Pt states understanding.

## 2015-03-12 ENCOUNTER — Encounter: Payer: Self-pay | Admitting: Podiatry

## 2015-03-12 ENCOUNTER — Ambulatory Visit (INDEPENDENT_AMBULATORY_CARE_PROVIDER_SITE_OTHER): Payer: BLUE CROSS/BLUE SHIELD | Admitting: Podiatry

## 2015-03-12 VITALS — BP 103/89 | HR 86 | Resp 12

## 2015-03-12 DIAGNOSIS — T148 Other injury of unspecified body region: Secondary | ICD-10-CM | POA: Diagnosis not present

## 2015-03-12 DIAGNOSIS — T148XXA Other injury of unspecified body region, initial encounter: Secondary | ICD-10-CM

## 2015-03-12 DIAGNOSIS — M76822 Posterior tibial tendinitis, left leg: Secondary | ICD-10-CM

## 2015-03-13 NOTE — Progress Notes (Signed)
Subjective:     Patient ID: Tara [name], female   DOB: 16-May-1956, 59 y.o.   MRN: [id number]  HPI patient presents stating it's feeling quite a bit better and at this time I'm able to go without the boot   Review of Systems     Objective:   Physical Exam Neurovascular status intact muscle strength adequate with discomfort in the posterior tibial tendon right that's improved by about 80% with discomfort still if she does a lot of walking    Assessment:     Posterior tibial tendinitis right that's improving with condition that is probably due to obesity and flatfoot deformity    Plan:     Reviewed continued orthotic usage gradual boot usage ice therapy reduced activity and to reappoint if symptoms persist

## 2015-03-19 ENCOUNTER — Other Ambulatory Visit: Payer: Self-pay | Admitting: Internal Medicine

## 2015-03-29 ENCOUNTER — Other Ambulatory Visit: Payer: Self-pay | Admitting: Internal Medicine

## 2015-03-30 ENCOUNTER — Other Ambulatory Visit: Payer: Self-pay | Admitting: Internal Medicine

## 2015-05-24 ENCOUNTER — Other Ambulatory Visit: Payer: Self-pay | Admitting: Internal Medicine

## 2015-06-27 ENCOUNTER — Other Ambulatory Visit: Payer: BLUE CROSS/BLUE SHIELD | Admitting: Internal Medicine

## 2015-06-27 ENCOUNTER — Other Ambulatory Visit: Payer: Self-pay | Admitting: Internal Medicine

## 2015-06-27 DIAGNOSIS — E039 Hypothyroidism, unspecified: Secondary | ICD-10-CM

## 2015-06-27 DIAGNOSIS — Z0000 Encounter for general adult medical examination without abnormal findings: Secondary | ICD-10-CM

## 2015-06-27 DIAGNOSIS — R7989 Other specified abnormal findings of blood chemistry: Secondary | ICD-10-CM

## 2015-06-27 DIAGNOSIS — E876 Hypokalemia: Secondary | ICD-10-CM

## 2015-06-27 DIAGNOSIS — R945 Abnormal results of liver function studies: Secondary | ICD-10-CM

## 2015-06-27 DIAGNOSIS — E669 Obesity, unspecified: Secondary | ICD-10-CM

## 2015-06-27 LAB — CBC WITH DIFFERENTIAL/PLATELET
Basophils Absolute: 0 10*3/uL (ref 0.0–0.1)
Basophils Relative: 0 % (ref 0–1)
Eosinophils Absolute: 0.3 10*3/uL (ref 0.0–0.7)
Eosinophils Relative: 5 % (ref 0–5)
HCT: 36.6 % (ref 36.0–46.0)
Hemoglobin: 12.5 g/dL (ref 12.0–15.0)
Lymphocytes Relative: 13 % (ref 12–46)
Lymphs Abs: 0.8 10*3/uL (ref 0.7–4.0)
MCH: 31.3 pg (ref 26.0–34.0)
MCHC: 34.2 g/dL (ref 30.0–36.0)
MCV: 91.5 fL (ref 78.0–100.0)
MPV: 12 fL (ref 8.6–12.4)
Monocytes Absolute: 0.6 10*3/uL (ref 0.1–1.0)
Monocytes Relative: 10 % (ref 3–12)
Neutro Abs: 4.5 10*3/uL (ref 1.7–7.7)
Neutrophils Relative %: 72 % (ref 43–77)
Platelets: 163 10*3/uL (ref 150–400)
RBC: 4 MIL/uL (ref 3.87–5.11)
RDW: 13.4 % (ref 11.5–15.5)
WBC: 6.2 10*3/uL (ref 4.0–10.5)

## 2015-06-27 LAB — COMPLETE METABOLIC PANEL WITH GFR
ALT: 26 U/L (ref 6–29)
AST: 26 U/L (ref 10–35)
Albumin: 3.7 g/dL (ref 3.6–5.1)
Alkaline Phosphatase: 60 U/L (ref 33–130)
BUN: 14 mg/dL (ref 7–25)
CO2: 26 mmol/L (ref 20–31)
Calcium: 7.9 mg/dL — ABNORMAL LOW (ref 8.6–10.4)
Chloride: 96 mmol/L — ABNORMAL LOW (ref 98–110)
Creat: 0.87 mg/dL (ref 0.50–0.99)
GFR, Est African American: 84 mL/min (ref >=60)
GFR, Est Non African American: 73 mL/min (ref >=60)
Glucose, Bld: 118 mg/dL — ABNORMAL HIGH (ref 65–99)
Potassium: 3.8 mmol/L (ref 3.5–5.3)
Sodium: 136 mmol/L (ref 135–146)
Total Bilirubin: 0.8 mg/dL (ref 0.2–1.2)
Total Protein: 6.5 g/dL (ref 6.1–8.1)

## 2015-06-27 LAB — TSH: TSH: 1.21 u[IU]/mL (ref 0.350–4.500)

## 2015-06-27 LAB — LIPID PANEL
Cholesterol: 160 mg/dL (ref 125–200)
HDL: 38 mg/dL — ABNORMAL LOW (ref >=46)
LDL Cholesterol: 93 mg/dL (ref <130)
Total CHOL/HDL Ratio: 4.2 Ratio (ref <=5.0)
Triglycerides: 144 mg/dL (ref <150)
VLDL: 29 mg/dL (ref <30)

## 2015-06-28 LAB — HEMOGLOBIN A1C
Hgb A1c MFr Bld: 6.2 % — ABNORMAL HIGH (ref <5.7)
Mean Plasma Glucose: 131 mg/dL — ABNORMAL HIGH (ref <117)

## 2015-07-01 ENCOUNTER — Encounter: Payer: Self-pay | Admitting: Internal Medicine

## 2015-07-01 ENCOUNTER — Ambulatory Visit (INDEPENDENT_AMBULATORY_CARE_PROVIDER_SITE_OTHER): Payer: BLUE CROSS/BLUE SHIELD | Admitting: Internal Medicine

## 2015-07-01 VITALS — BP 128/76 | HR 64 | Temp 97.3°F | Resp 18 | Ht 67.0 in | Wt 232.0 lb

## 2015-07-01 DIAGNOSIS — F418 Other specified anxiety disorders: Secondary | ICD-10-CM

## 2015-07-01 DIAGNOSIS — R7302 Impaired glucose tolerance (oral): Secondary | ICD-10-CM

## 2015-07-01 DIAGNOSIS — F329 Major depressive disorder, single episode, unspecified: Secondary | ICD-10-CM

## 2015-07-01 DIAGNOSIS — Z0000 Encounter for general adult medical examination without abnormal findings: Secondary | ICD-10-CM | POA: Diagnosis not present

## 2015-07-01 DIAGNOSIS — Z23 Encounter for immunization: Secondary | ICD-10-CM

## 2015-07-01 DIAGNOSIS — R609 Edema, unspecified: Secondary | ICD-10-CM

## 2015-07-01 DIAGNOSIS — F32A Depression, unspecified: Secondary | ICD-10-CM

## 2015-07-01 DIAGNOSIS — Z853 Personal history of malignant neoplasm of breast: Secondary | ICD-10-CM

## 2015-07-01 DIAGNOSIS — Z9660 Presence of unspecified orthopedic joint implant: Secondary | ICD-10-CM

## 2015-07-01 DIAGNOSIS — Z96643 Presence of artificial hip joint, bilateral: Secondary | ICD-10-CM

## 2015-07-01 DIAGNOSIS — Z862 Personal history of diseases of the blood and blood-forming organs and certain disorders involving the immune mechanism: Secondary | ICD-10-CM | POA: Diagnosis not present

## 2015-07-01 DIAGNOSIS — Z8619 Personal history of other infectious and parasitic diseases: Secondary | ICD-10-CM | POA: Diagnosis not present

## 2015-07-01 DIAGNOSIS — E039 Hypothyroidism, unspecified: Secondary | ICD-10-CM | POA: Diagnosis not present

## 2015-07-01 DIAGNOSIS — E669 Obesity, unspecified: Secondary | ICD-10-CM

## 2015-07-01 DIAGNOSIS — F419 Anxiety disorder, unspecified: Secondary | ICD-10-CM

## 2015-07-01 LAB — POCT URINALYSIS DIPSTICK
Bilirubin, UA: NEGATIVE
Blood, UA: NEGATIVE
Glucose, UA: NEGATIVE
Ketones, UA: NEGATIVE
Leukocytes, UA: NEGATIVE
Nitrite, UA: NEGATIVE
Protein, UA: NEGATIVE
Spec Grav, UA: 1.02
Urobilinogen, UA: 0.2
pH, UA: 7

## 2015-07-01 NOTE — Patient Instructions (Addendum)
Patient will return in one month for Prevnar and in 2 months for Tdap vaccine. In February she'll return for office visit and hemoglobin A1c. She'll be referred to the diabetes treatment Center for counseling regarding hemoglobin A1c of 6.2%. Flu vaccine given today. Continue same medications. Patient schedule mammogram in the near future.

## 2015-07-01 NOTE — Progress Notes (Signed)
Subjective:    Patient ID: Tara [name], female    DOB: 1956-06-13, 59 y.o.   MRN: [id number]  HPI 59 year old Female in today for health maintenance exam and evaluation of medical issues. She has a history of obesity, hypothyroidism, allergic rhinitis, asthma, status post bilateral hip replacements: Right 2005 and left 2007. History of TTP. History of hepatitis C. History of depression treated with Celexa. Has a lot of anxiety about her health. She has a history of dependent edema. History of eczema left foot. Has had some back pain which has improved. With regard to TTP, she was 59 years of age when she had it. She did not have splenectomy. She had recurrence at age 32 while being treated with interferon for hepatitis C.  History of left arm lymphedema. History of fractured left elbow with pins placed. Left foot reconstruction 2008.  Dr. Jana Hakim referred her in 2013 for primary care. She found a mass in her left breast while residing in [place] in 2012. Mammogram showed suspicious abnormality in the left breast measuring about 10 mm in physical exam showed a 1 cm firm superficial mass in the axillary region of the breast. Biopsy performed October 2012 at [facility]. She had invasive ductal carcinoma grade 3, estrogen receptor positive, progesterone receptor positive. Tumor was HERS- 2 positive with a ratio of 2.5. Subsequently had definitive left lumpectomy and sentinel node sampling December 2012. 2 sentinel nodes were negative for tumor. Decision was made to proceed with 4 cycles of adjuvant chemotherapy consisting of Doxil Taxol, cyclophosphamide given with trastuzumab every 3 weeks with Neulasta on day 2 for granulocyte support. Consider scybala mod was to be continued for one year. Chemotherapy was started in [place] in 2013 and she moved here from [place]. She developed cellulitis in her left breast and was treated with Keflex.  She has done well with no significant  peripheral neuropathy. She received radiation therapy here. She now is on Femara.  Social history: She does not smoke. 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 Husband is a Dance movement psychotherapist. Drinks a glass of wine twice weekly.  Family history: Mother with history of skin cancer but patient not sure what type. Father died of COPD complications. Older sister and younger brother healthy. Family history of breast cancer in the immediate family. No new changes with family history according to patient.  Patient admits she is not following a strict low-calorie diet. Hemoglobin A1c has increased. She will be referred to diabetes treatment Center for counseling. Plan to recheck hemoglobin A1c in February.  Flu vaccine given today  Review of Systems  HENT:       Uses steroid nasal spray  Eyes:       Scleral hemorrhage left eye after sneezing hard  Cardiovascular: Positive for leg swelling. Negative for chest pain.  Genitourinary: Negative.   Neurological: Negative.   Hematological: Negative.   Psychiatric/Behavioral:       History of anxiety related to health issues       Objective:   Physical Exam  Constitutional: She is oriented to person, place, and time. She appears well-developed and well-nourished. No distress.  HENT:  Head: Normocephalic and atraumatic.  Right Ear: External ear normal.  Left Ear: External ear normal.  Mouth/Throat: Oropharynx is clear and moist.  Eyes: Conjunctivae and EOM are normal. Pupils are equal, round, and reactive to light. Right eye exhibits no discharge. Left eye exhibits no discharge. No  scleral icterus.  Neck: Neck supple. No JVD present. No thyromegaly present.  Cardiovascular: Normal rate, regular rhythm, normal heart sounds and intact distal pulses.   No murmur heard. Pulmonary/Chest: Effort normal and breath sounds normal. No respiratory distress. She has no wheezes. She has no rales. She exhibits no  tenderness.  Breasts normal female without masses  Abdominal: Soft. Bowel sounds are normal. She exhibits no distension and no mass. There is no tenderness. There is no rebound and no guarding.  Genitourinary:  Pap deferred. Bimanual normal.  Musculoskeletal: Normal range of motion. She exhibits edema.  Nonpitting lower extremity edema with stasis changes  Neurological: She is alert and oriented to person, place, and time. She has normal reflexes. No cranial nerve deficit. Coordination normal.  Skin: Skin is warm and dry. No rash noted. She is not diaphoretic.  Psychiatric: She has a normal mood and affect. Her behavior is normal. Judgment and thought content normal.  Vitals reviewed.         Assessment & Plan:  Normal health maintenance exam  Obesity  Impaired glucose tolerance  Hypothyroidism-TSH stable  History of breast cancer followed by Dr. Gunnar Bulla Magrinat on Femara  History of back pain  Allergic rhinitis  Asthma  Status post bilateral hip replacements  History of TTP  History of hepatitis C  Anxiety and depression  Plan: Return in February for follow-up on hemoglobin A1c. Referred diabetes treatment Center for counseling. Flu vaccine given. To return for Prevnar and subsequently tetanus immunization. Can only give one injection at a time due to history of breast cancer left breast.

## 2015-07-03 ENCOUNTER — Telehealth: Payer: Self-pay | Admitting: *Deleted

## 2015-07-03 ENCOUNTER — Other Ambulatory Visit: Payer: Self-pay | Admitting: *Deleted

## 2015-07-03 DIAGNOSIS — C50912 Malignant neoplasm of unspecified site of left female breast: Secondary | ICD-10-CM

## 2015-07-03 NOTE — Telephone Encounter (Signed)
TC from patient stating that she is due for mammogram @ [place] has told her she needs to call us for an order for bilateral diagnostic mammogram. She is scheduled for this on 07/22/15

## 2015-07-04 ENCOUNTER — Other Ambulatory Visit: Payer: Self-pay | Admitting: Oncology

## 2015-07-04 DIAGNOSIS — C50919 Malignant neoplasm of unspecified site of unspecified female breast: Secondary | ICD-10-CM

## 2015-07-04 NOTE — Telephone Encounter (Signed)
Order entered

## 2015-07-05 ENCOUNTER — Encounter: Payer: Self-pay | Admitting: Internal Medicine

## 2015-07-05 DIAGNOSIS — E1310 Other specified diabetes mellitus with ketoacidosis without coma: Secondary | ICD-10-CM

## 2015-07-18 ENCOUNTER — Ambulatory Visit (INDEPENDENT_AMBULATORY_CARE_PROVIDER_SITE_OTHER): Payer: BLUE CROSS/BLUE SHIELD | Admitting: Internal Medicine

## 2015-07-18 ENCOUNTER — Encounter: Payer: Self-pay | Admitting: Internal Medicine

## 2015-07-18 VITALS — BP 122/72 | HR 93 | Temp 98.0°F | Resp 18 | Ht 67.0 in | Wt 226.0 lb

## 2015-07-18 DIAGNOSIS — J069 Acute upper respiratory infection, unspecified: Secondary | ICD-10-CM

## 2015-07-18 DIAGNOSIS — J9801 Acute bronchospasm: Secondary | ICD-10-CM | POA: Diagnosis not present

## 2015-07-18 MED ORDER — METHYLPREDNISOLONE ACETATE 80 MG/ML IJ SUSP
80.0000 mg | Freq: Once | INTRAMUSCULAR | Status: AC
  Administered 2015-07-18: 80 mg via INTRAMUSCULAR

## 2015-07-18 MED ORDER — LEVOFLOXACIN 500 MG PO TABS: 500.0000 mg | ORAL_TABLET | Freq: Every day | ORAL | Status: DC

## 2015-07-18 NOTE — Patient Instructions (Signed)
Depo-Medrol 80 mg IM. Take Levaquin 500 milligrams daily for 7 days. Take Delsym for cough. Use inhaler 2 sprays by mouth 4 times daily for wheezing

## 2015-07-18 NOTE — Progress Notes (Signed)
   Subjective:    Patient ID: Tara [name], female    DOB: 09/13/1955, 59 y.o.   MRN: [id number]  HPI Onset of URI symptoms Thursday, November 10. Has had some wheezing. His only been using Ventolin inhaler one spray twice a day. Has a lot of postnasal drip. Some discolored sputum. No fever or shaking chills. Going out of town the middle of next week for Thanksgiving.    Review of Systems     Objective:   Physical Exam  Skin warm and dry. Nodes none. TMs slightly dull but not red. Pharynx slightly injected. Chest: bilateral inspiratory wheezing. No rales.      Assessment & Plan:  Acute URI  Bronchospasm  Plan: Levaquin 500 milligrams daily for 7 days. Delsym for cough. Depo-Medrol 80 mg IM. Use Ventolin inhaler 2 sprays by mouth 4 times a day

## 2015-07-19 ENCOUNTER — Other Ambulatory Visit: Payer: Self-pay | Admitting: Internal Medicine

## 2015-07-20 ENCOUNTER — Other Ambulatory Visit: Payer: Self-pay | Admitting: Internal Medicine

## 2015-07-29 ENCOUNTER — Other Ambulatory Visit: Payer: Self-pay | Admitting: Internal Medicine

## 2015-08-01 ENCOUNTER — Ambulatory Visit (INDEPENDENT_AMBULATORY_CARE_PROVIDER_SITE_OTHER): Payer: BLUE CROSS/BLUE SHIELD | Admitting: Internal Medicine

## 2015-08-01 ENCOUNTER — Encounter: Payer: Self-pay | Admitting: Internal Medicine

## 2015-08-01 VITALS — BP 112/72 | HR 78

## 2015-08-01 DIAGNOSIS — Z23 Encounter for immunization: Secondary | ICD-10-CM

## 2015-08-09 ENCOUNTER — Encounter: Payer: BLUE CROSS/BLUE SHIELD | Attending: Internal Medicine | Admitting: *Deleted

## 2015-08-09 ENCOUNTER — Encounter: Payer: Self-pay | Admitting: *Deleted

## 2015-08-09 DIAGNOSIS — Z713 Dietary counseling and surveillance: Secondary | ICD-10-CM | POA: Diagnosis not present

## 2015-08-09 DIAGNOSIS — R7302 Impaired glucose tolerance (oral): Secondary | ICD-10-CM

## 2015-08-09 NOTE — Progress Notes (Signed)
  Medical Nutrition Therapy:  Appt start time: 1100 end time:  1230.  Assessment:  Primary concerns today: weight loss to help with Pre-diabetes. Weight loss recently from 254 after chemo and radiation therapy for breast cancer 4 years ago. Not working currently, she was diagnosed with breast cancer 6 years ago, currently she is fairly sedentary. She has a dog, Charlie who she adores. She lives with her husband and is home most of the day. Used to [employer], but not anymore due to health issues. She cross stitches and enjoys the computer. History of TTP x 2 and Hep C, 2 hip replacements. She has friends at her church too.  Preferred Learning Style:   No preference indicated   Learning Readiness:   Ready  Change in progress  MEDICATIONS: see list   DIETARY INTAKE:  Usual eating pattern includes 2-3 meals and 0-2 snacks per day.  Everyday foods include good variety of all food groups.  Avoided foods include none stated.    24-hr recall:  B ( AM): coffee, [employer] with vitamins and minerals OR oatmeal OR 1/2 English muffin with an egg  Snk ( AM): no  L ( PM): sandwich OR soup OR salad OR burger eating out Snk ( PM): trying to cut the snacks out D ( PM): she prepares supper meal: meat, starch, vegetable,  Snk ( PM): has cut out recently, may have peanuts Beverages: coffee, water  Usual physical activity: house work, walks dog 1/2 mile a day or more  Estimated energy needs: 1400 calories 158 g carbohydrates 105 g protein 39 g fat  Progress Towards Goal(s):  In progress.   Nutritional Diagnosis:  NI-1.5 Excessive energy intake As related to weight management.  As evidenced by BMI of 37.    Intervention:  Nutrition counseling and pre-diabetes education initiated. Discussed Carb Counting by food group as method of portion control, reading food labels, and benefits of increased activity. Also discussed basic physiology of Diabetes, target BG ranges pre and post meals, and A1c.  .  Teaching Method Utilized: Visual, Auditory and Hands on  Handouts given during visit include: Carb Counting and Food Label handouts Meal Plan Card  Barriers to learning/adherence to lifestyle change: none  Demonstrated degree of understanding via:  Teach Back   Monitoring/Evaluation:  Dietary intake, exercise, reading food labels, and body weight in 1 month(s).

## 2015-08-09 NOTE — Patient Instructions (Signed)
Plan:  Aim for 3 Carb Choices per meal (45 grams) +/- 1 either way  Aim for 0-1 Carbs per snack if hungry  Include protein in moderation with your meals and snacks Consider reading food labels for Total Carbohydrate of foods     

## 2015-08-12 ENCOUNTER — Other Ambulatory Visit: Payer: Self-pay

## 2015-08-12 ENCOUNTER — Other Ambulatory Visit (HOSPITAL_BASED_OUTPATIENT_CLINIC_OR_DEPARTMENT_OTHER): Payer: BLUE CROSS/BLUE SHIELD

## 2015-08-12 DIAGNOSIS — C50412 Malignant neoplasm of upper-outer quadrant of left female breast: Secondary | ICD-10-CM | POA: Diagnosis not present

## 2015-08-12 DIAGNOSIS — Z170 Estrogen receptor positive status [ER+]: Secondary | ICD-10-CM

## 2015-08-12 DIAGNOSIS — C50612 Malignant neoplasm of axillary tail of left female breast: Secondary | ICD-10-CM

## 2015-08-12 HISTORY — DX: Estrogen receptor positive status (ER+): Z17.0

## 2015-08-12 LAB — CBC WITH DIFFERENTIAL/PLATELET
BASO%: 0.9 % (ref 0.0–2.0)
Basophils Absolute: 0.1 10*3/uL (ref 0.0–0.1)
EOS%: 3.4 % (ref 0.0–7.0)
Eosinophils Absolute: 0.2 10*3/uL (ref 0.0–0.5)
HCT: 40.2 % (ref 34.8–46.6)
HGB: 13.2 g/dL (ref 11.6–15.9)
LYMPH%: 14 % (ref 14.0–49.7)
MCH: 30.7 pg (ref 25.1–34.0)
MCHC: 32.8 g/dL (ref 31.5–36.0)
MCV: 93.7 fL (ref 79.5–101.0)
MONO#: 0.6 10*3/uL (ref 0.1–0.9)
MONO%: 8.6 % (ref 0.0–14.0)
NEUT#: 4.6 10*3/uL (ref 1.5–6.5)
NEUT%: 73.1 % (ref 38.4–76.8)
Platelets: 147 10*3/uL (ref 145–400)
RBC: 4.29 10*6/uL (ref 3.70–5.45)
RDW: 14.4 % (ref 11.2–14.5)
WBC: 6.4 10*3/uL (ref 3.9–10.3)
lymph#: 0.9 10*3/uL (ref 0.9–3.3)

## 2015-08-12 LAB — COMPREHENSIVE METABOLIC PANEL
ALT: 32 U/L (ref 0–55)
AST: 29 U/L (ref 5–34)
Albumin: 3.8 g/dL (ref 3.5–5.0)
Alkaline Phosphatase: 65 U/L (ref 40–150)
Anion Gap: 11 mEq/L (ref 3–11)
BUN: 16.5 mg/dL (ref 7.0–26.0)
CO2: 27 mEq/L (ref 22–29)
Calcium: 9 mg/dL (ref 8.4–10.4)
Chloride: 101 mEq/L (ref 98–109)
Creatinine: 0.9 mg/dL (ref 0.6–1.1)
EGFR: 74 mL/min/{1.73_m2} — ABNORMAL LOW (ref >90)
Glucose: 105 mg/dl (ref 70–140)
Potassium: 4.7 mEq/L (ref 3.5–5.1)
Sodium: 139 mEq/L (ref 136–145)
Total Bilirubin: 0.75 mg/dL (ref 0.20–1.20)
Total Protein: 7.7 g/dL (ref 6.4–8.3)

## 2015-08-19 ENCOUNTER — Telehealth: Payer: Self-pay | Admitting: Oncology

## 2015-08-19 ENCOUNTER — Ambulatory Visit (HOSPITAL_BASED_OUTPATIENT_CLINIC_OR_DEPARTMENT_OTHER): Payer: BLUE CROSS/BLUE SHIELD

## 2015-08-19 ENCOUNTER — Ambulatory Visit (HOSPITAL_BASED_OUTPATIENT_CLINIC_OR_DEPARTMENT_OTHER): Payer: BLUE CROSS/BLUE SHIELD | Admitting: Oncology

## 2015-08-19 VITALS — BP 115/74 | HR 74 | Temp 97.6°F | Resp 18 | Ht 66.0 in | Wt 230.4 lb

## 2015-08-19 DIAGNOSIS — Z79811 Long term (current) use of aromatase inhibitors: Secondary | ICD-10-CM

## 2015-08-19 DIAGNOSIS — C50912 Malignant neoplasm of unspecified site of left female breast: Secondary | ICD-10-CM | POA: Diagnosis not present

## 2015-08-19 DIAGNOSIS — Z170 Estrogen receptor positive status [ER+]: Secondary | ICD-10-CM | POA: Diagnosis not present

## 2015-08-19 DIAGNOSIS — Z853 Personal history of malignant neoplasm of breast: Secondary | ICD-10-CM

## 2015-08-19 DIAGNOSIS — D693 Immune thrombocytopenic purpura: Secondary | ICD-10-CM | POA: Diagnosis not present

## 2015-08-19 DIAGNOSIS — C50412 Malignant neoplasm of upper-outer quadrant of left female breast: Secondary | ICD-10-CM

## 2015-08-19 LAB — URINALYSIS, MICROSCOPIC - CHCC
Bilirubin (Urine): NEGATIVE
Blood: NEGATIVE
Glucose: NEGATIVE mg/dL
Ketones: NEGATIVE mg/dL
Leukocyte Esterase: NEGATIVE
Nitrite: NEGATIVE
Protein: NEGATIVE mg/dL
RBC / HPF: NEGATIVE (ref 0–2)
Specific Gravity, Urine: 1.005 (ref 1.003–1.035)
Urobilinogen, UR: 0.2 mg/dL (ref 0.2–1)
pH: 6.5 (ref 4.6–8.0)

## 2015-08-19 MED ORDER — LETROZOLE 2.5 MG PO TABS: 2.5000 mg | ORAL_TABLET | Freq: Every day | ORAL | Status: DC

## 2015-08-19 NOTE — Telephone Encounter (Signed)
Appointments made and avs printed for patient,dexa solis [phone]  2:30

## 2015-08-19 NOTE — Progress Notes (Signed)
ID: Tara [name]   DOB: Jan 13, 1956  MR#: [id number]  [id number]  PCP:  [name], MD GYN: SU:  Autumn Messing, MD Other:  Delfin Edis, MD;  Arloa Koh, MD;  Glori Bickers, MD  CHIEF COMPLAINT:    Estrogen receptor positive breast cancer   CURRENT TREATMENT: Letrozole   HISTORY OF PRESENT ILLNESS:  from the original intake note:  Tara [name] noted a mass in her left breast and brought it to her physician's attention in [place], where she was residing. She was set up for mammography 06/24/2011 at [facility] in [place], [place]. This showed a suspicious abnormality in the left breast measuring approximately 10 mm. Physical examination showed a 1 cm firm superficial mass in the axillary region of the breast and ultrasound showed this to be 11 mm and spiculated.  Biopsy was performed June 25, 2011, and showed ([id number] at the [facility]) and invasive ductal carcinoma, grade 3, estrogen receptor 95% positive, progesterone receptor 40% positive, with an MIB-1 of 29%. The tumor is HER 2 positive by FISH with a ratio of 2.5. There is tumor heterogeneity noted.  With this information the case was presented Jul 08, 2011 at the multidisciplinary  breast cancer clinic. She proceeded to definitive left lumpectomy and sentinel lymph node sampling 08/03/2011. This confirmed a 1.6 cm invasive ductal carcinoma, grade 3. Margins were ample. The two sentinel lymph nodes were clear.   Her subsequent history is as detailed below.  INTERVAL HISTORY: Tara [name] returns today for follow-up of her Left-sided estrogen receptor positive breast cancer. She continues on letrozole, with good tolerance. Hot flashes or night sweats are not a major deal and she never developed the arthralgias or myalgias that many patients can experience on this drug. She does have significant vaginal dryness problems. She obtained today approximately $10 per month  REVIEW OF SYSTEMS: Tara [name] has been trying to  lose weight and has lost about a pound a month in the last 2 years. This is very favorable. There was problem she has his left flank pain , what used to be called "lumbago" I think, and this is there all the time, sometimes worsened sometimes better. She tells me she had a chest x-ray for to evaluate this, some physical therapy, and some acupuncture, none of which was helpful. Aside from that of course she has her problems with seasonal allergies including ringing in her ears and hoarseness. She has an irregular heartbeat. She has a history of hepatitis C positivity. Just some aches and pains here in there which are not more persistent or intense than before. It detailed review of systems today was otherwise noncontributory  PAST MEDICAL HISTORY: Past Medical History  Diagnosis Date  . Thrombocytopenia, primary ([place])   . Asthma   . Arthritis   . Complication of anesthesia     difficulty waking up/dizzy/lightheaded  . Environmental allergies   . Shortness of breath   . Hypothyroidism   . Blood transfusion   . Anxiety   . Dysrhythmia     irregular heartbeat, takes digoxin  . Chronic edema   . Breast cancer ([place]) 2012    left breast  . Hx of radiation therapy 12/15/11 - 01/29/12    left breast  . H/O varicella   . History of measles, mumps, or rubella   . TTP (thrombotic thrombocytopenic purpura) ([facility])   . Hepatitis C   . H/O varicose veins    PAST SURGICAL HISTORY: Past Surgical History  Procedure Laterality Date  .  Elbow pins Left   . Total hip arthroplasty Bilateral   . Foot surgery Left     multiple  . Portacath placement  08/03/2011    Procedure: INSERTION PORT-A-CATH;  Surgeon: Merrie Roof, MD;  Location: [place];  Service: General;  Laterality: Right;  . Breast lumpectomy  08/03/11    left lumpectomy and slnbx,T1cN0,triple pos    FAMILY HISTORY Family History  Problem Relation Age of Onset  . Brain cancer Maternal Grandfather   . Alcohol abuse Maternal Grandfather   .  Heart disease Neg Hx   . Hyperlipidemia Neg Hx   . Hypertension Neg Hx   . COPD Father   Patient's father died at the age of 78 from emphysema in the setting of tobacco abuse; patient's mother is alive. The patient has one brother and one sister; there is no history of breast or ovarian cancer in the immediate family   Gynecologic history: She is [place] P0 menarche age 44 most recent period July of 2008 she never took hormone replacement   Social History: (Updated 12/04/2012) The patient is a homemaker; she used to work in an office previously. Her husband [name] is a Scientist, research (physical sciences), but currently working a temporary job in [place]. They moved here from [place] [facility] Oct 2012. Her dog's name is Eduard Clos (a dachshund/chihuahua mix)   ADVANCED DIRECTIVES: her husband is her healthcare power of attorney  HEALTH MAINTENANCE: (Updated 12/04/2012) Social History  Substance Use Topics  . Smoking status: Never Smoker   . Smokeless tobacco: Never Used  . Alcohol Use: 0.6 oz/week    1 Glasses of wine per week     Colonoscopy: Signoidoscopy Aug 2014, Dr. Olevia Perches  PAP: Oct 2014, Dr. Renold Genta  Bone density: November 2014, [facility], Normal  Lipid panel: Oct. 2014, Dr. Renold Genta   Allergies  Allergen Reactions  . Aspirin     Hx of TTP  . Nsaids     Hx TTP    Current Outpatient Prescriptions  Medication Sig Dispense Refill  . acetaminophen (TYLENOL) 500 MG tablet Take 500 mg by mouth every 6 (six) hours as needed. For pain     . albuterol (PROVENTIL HFA;VENTOLIN HFA) 108 (90 BASE) MCG/ACT inhaler Inhale 2 puffs into the lungs every 6 (six) hours as needed. For shortness of breath     . cholecalciferol (VITAMIN D) 1000 UNITS tablet Take 3,000 Units by mouth daily.     . citalopram (CELEXA) 10 MG tablet TAKE ONE TABLET BY MOUTH ONCE DAILY 30 tablet 11  . clotrimazole-betamethasone (LOTRISONE) cream Apply 1 application topically 2 (two) times daily. 45 g 2  . digoxin (LANOXIN) 0.25 MG tablet Take 1 tablet (0.25 mg  total) by mouth daily. 90 tablet 3  . fluticasone (FLONASE) 50 MCG/ACT nasal spray Place 2 sprays into the nose daily. 1 g 0  . Fluticasone-Salmeterol (ADVAIR) 250-50 MCG/DOSE AEPB Inhale 1 puff into the lungs every 12 (twelve) hours. 1 each 3  . glucosamine-chondroitin 500-400 MG tablet Take 1 tablet by mouth daily.     Marland Kitchen KLOR-CON M20 20 MEQ tablet TAKE ONE TABLET (20 MEQ) BY MOUTH ONCE A DAY. 30 tablet 5  . letrozole (FEMARA) 2.5 MG tablet Take 1 tablet (2.5 mg total) by mouth daily. 90 tablet 3  . levothyroxine (SYNTHROID, LEVOTHROID) 88 MCG tablet TAKE ONE TABLET BY MOUTH ONCE A DAY. 90 tablet 1  . loperamide (IMODIUM A-D) 2 MG tablet Take 2 mg by mouth 4 (four) times daily as needed. For diarrhea     .  Loratadine (CLARITIN) 10 MG CAPS Take by mouth as needed.    . montelukast (SINGULAIR) 10 MG tablet Take 1 tablet (10 mg total) by mouth at bedtime. 90 tablet 3  . Nutritional Supplements (OSTEO ADVANCE) TABS Take by mouth.    Marland Kitchen OVER THE COUNTER MEDICATION Take 1 tablet by mouth daily as needed. For gas pain    . pimecrolimus (ELIDEL) 1 % cream Apply 1 application topically 2 (two) times daily as needed. For exczema     . torsemide (DEMADEX) 20 MG tablet TAKE 1 TABLET BY MOUTH DAILY 30 tablet 5  . vitamin B-12 (CYANOCOBALAMIN) 100 MCG tablet Take 2,000 mcg by mouth daily.     . Vitamin D, Ergocalciferol, (DRISDOL) 50000 UNITS CAPS capsule Take 50,000 Units by mouth every 7 (seven) days.    Tara [name] 625 MG tablet TAKE 2 TABLETS BY MOUTH EVERY DAY 60 tablet 1   No current facility-administered medications for this visit.    OBJECTIVE: Middle-aged white woman  Who appears stated age 59 Vitals:   08/19/15 1359  BP: 115/74  Pulse: 74  Temp: 97.6 F (36.4 C)  Resp: 18     Body mass index is 37.21 kg/(m^2).    ECOG FS: 1 Filed Weights   08/19/15 1359  Weight: 230 lb 6.4 oz (104.509 kg)   Sclerae unicteric, EOMs intact Oropharynx clear, no thrush or other lesions No cervical or  supraclavicular adenopathy Lungs no rales or rhonchi Heart regular rate and rhythm Abd soft,  Obese,nontender, positive bowel sounds MSK no focal spinal tenderness, no upper extremity lymphedema ; acromegaly clinically Neuro: nonfocal, well oriented, appropriate affect Breasts:  The right breast is unremarkable. The left breast is status post lumpectomy and radiation. There is no evidence of local recurrence. The left axilla is benign     LAB RESULTS: Lab Results  Component Value Date   WBC 6.4 08/12/2015   NEUTROABS 4.6 08/12/2015   HGB 13.2 08/12/2015   HCT 40.2 08/12/2015   MCV 93.7 08/12/2015   PLT 147 08/12/2015      Chemistry      Component Value Date/Time   NA 139 08/12/2015 1102   NA 136 06/27/2015 0900   K 4.7 08/12/2015 1102   K 3.8 06/27/2015 0900   CL 96* 06/27/2015 0900   CL 108* 12/05/2012 0953   CO2 27 08/12/2015 1102   CO2 26 06/27/2015 0900   BUN 16.5 08/12/2015 1102   BUN 14 06/27/2015 0900   CREATININE 0.9 08/12/2015 1102   CREATININE 0.87 06/27/2015 0900   CREATININE 0.70 04/18/2012 1005      Component Value Date/Time   CALCIUM 9.0 08/12/2015 1102   CALCIUM 7.9* 06/27/2015 0900   ALKPHOS 65 08/12/2015 1102   ALKPHOS 60 06/27/2015 0900   AST 29 08/12/2015 1102   AST 26 06/27/2015 0900   ALT 32 08/12/2015 1102   ALT 26 06/27/2015 0900   BILITOT 0.75 08/12/2015 1102   BILITOT 0.8 06/27/2015 0900      STUDIES:  mammography at [facility] November 2016 was unremarkable  ASSESSMENT: 59 y.o.  [place] woman originally from [place]   1. Status post left lumpectomy and sentinel lymph node sampling 08/03/2011 for a T1c N0 (Stage IA) invasive ductal carcinoma, high-grade, triple positive.   2. treated in the adjuvant setting with 4 doses of docetaxel and cyclophosphamide completed 11/16/2011 given along with trastuzumab  3. Trastuzumab continued to total of one year (to December 2013)  4. Status post radiation therapy, completed  late May of  2013.  5. began on anastrozole in early June 2013. Discontinued in September 2013 due to in tolerance (rash). Started on letrozole in October 2013.  (a) DEXA scan at [facility] 07/17/2013 was normal  7. Comorbidities include a history of remote thrombotic thrombocytopenic purpura and hepatitis C.    PLAN: [place]  Has been on letrozole now for 2 years, with very good tolerance. The plan is to continue that for a total of 5 years, after which she will "graduate" from follow-up here.  The big concern of course is bone density. I have set her up for repeat DEXA scan this month at [facility]. They will send [name] the report.    I am referring her to our intimacy and pelvic health program for the vaginal dryness issue and I gave her a copy of the [employer] program to see if she can reenroll , which I think would help her in her goal to continue to lose approximately a pound a month   I think the left flank pain is likely to be musculoskeletal. I'm going to obtain a urinalysis today to check for blood and I am also setting her up for CT of the chest just to make sure there is nothing else to worry about in that area.  Otherwise she will return to see me in one year. She knows to call for any problems that may develop before then. Chauncey Cruel, MD    08/19/2015

## 2015-08-23 ENCOUNTER — Ambulatory Visit (HOSPITAL_COMMUNITY)
Admission: RE | Admit: 2015-08-23 | Discharge: 2015-08-23 | Disposition: A | Discharge status: Home / Self Care | Payer: BLUE CROSS/BLUE SHIELD | Source: Ambulatory Visit | Attending: Oncology | Admitting: Oncology

## 2015-08-23 DIAGNOSIS — Z923 Personal history of irradiation: Secondary | ICD-10-CM | POA: Insufficient documentation

## 2015-08-23 DIAGNOSIS — R109 Unspecified abdominal pain: Secondary | ICD-10-CM | POA: Diagnosis present

## 2015-08-23 DIAGNOSIS — C50412 Malignant neoplasm of upper-outer quadrant of left female breast: Secondary | ICD-10-CM | POA: Insufficient documentation

## 2015-08-23 DIAGNOSIS — Z9221 Personal history of antineoplastic chemotherapy: Secondary | ICD-10-CM | POA: Diagnosis not present

## 2015-08-23 DIAGNOSIS — Z853 Personal history of malignant neoplasm of breast: Secondary | ICD-10-CM

## 2015-08-23 MED ORDER — IOHEXOL 300 MG/ML  SOLN
75.0000 mL | Freq: Once | INTRAMUSCULAR | Status: AC | PRN
  Administered 2015-08-23: 75 mL via INTRAVENOUS

## 2015-08-26 ENCOUNTER — Ambulatory Visit (INDEPENDENT_AMBULATORY_CARE_PROVIDER_SITE_OTHER): Payer: BLUE CROSS/BLUE SHIELD | Admitting: Podiatry

## 2015-08-26 ENCOUNTER — Encounter: Payer: Self-pay | Admitting: Podiatry

## 2015-08-26 ENCOUNTER — Ambulatory Visit (INDEPENDENT_AMBULATORY_CARE_PROVIDER_SITE_OTHER): Payer: BLUE CROSS/BLUE SHIELD

## 2015-08-26 DIAGNOSIS — M779 Enthesopathy, unspecified: Secondary | ICD-10-CM

## 2015-08-26 DIAGNOSIS — M79675 Pain in left toe(s): Secondary | ICD-10-CM

## 2015-08-26 DIAGNOSIS — Z9889 Other specified postprocedural states: Secondary | ICD-10-CM

## 2015-08-26 MED ORDER — TRIAMCINOLONE ACETONIDE 10 MG/ML IJ SUSP
10.0000 mg | Freq: Once | INTRAMUSCULAR | Status: AC
  Administered 2015-08-26: 10 mg

## 2015-08-29 NOTE — Progress Notes (Signed)
Subjective:     Patient ID: Tara [name], female   DOB: [birth year], 59 y.o.   MRN: [id number]  HPI patient states I'm having a lot of pain in my left forefoot and I'm getting ready to go to [place] in mid January. It's been present for around a month   Review of Systems     Objective:   Physical Exam Neurovascular status intact muscle strength adequate with inflammation pain second metatarsophalangeal joint left with fluid buildup and no current movement of the second toe noted 2 significant nature    Assessment:     Inflammatory capsulitis second MPJ left    Plan:     H&P condition reviewed and discussed different treatment options. Today I did a proximal nerve block aspirated the joint getting out a small amount of clear fluid and injected with a quarter cc of dexamethasone Kenalog and applied thick plantar pad to remove pressure against the joint surface in 2 weeks

## 2015-09-05 ENCOUNTER — Telehealth: Payer: Self-pay

## 2015-09-05 ENCOUNTER — Ambulatory Visit (INDEPENDENT_AMBULATORY_CARE_PROVIDER_SITE_OTHER): Payer: Managed Care, Other (non HMO) | Admitting: Internal Medicine

## 2015-09-05 VITALS — BP 122/70

## 2015-09-05 DIAGNOSIS — Z23 Encounter for immunization: Secondary | ICD-10-CM

## 2015-09-05 MED ORDER — TETANUS-DIPHTH-ACELL PERTUSSIS 5-2.5-18.5 LF-MCG/0.5 IM SUSP
0.5000 mL | Freq: Once | INTRAMUSCULAR | Status: AC
  Administered 2015-09-05: 0.5 mL via INTRAMUSCULAR

## 2015-09-05 NOTE — Telephone Encounter (Signed)
Patient states that her insurance has required that she use advair instead of pulmicort. She has used this in the past and had to switch due to voice hoarsness. She has been using the advair and again she has a hoarse voice. Please advise if you would like to change her back to pulmicort.

## 2015-09-05 NOTE — Telephone Encounter (Signed)
Please see if insurance will change to Pulmicort If not, she will have to go back to allergist to get this approved.

## 2015-09-09 ENCOUNTER — Ambulatory Visit: Payer: BLUE CROSS/BLUE SHIELD | Admitting: Podiatry

## 2015-09-10 ENCOUNTER — Other Ambulatory Visit: Payer: Self-pay

## 2015-09-10 MED ORDER — BUDESONIDE 180 MCG/ACT IN AEPB: 2.0000 | INHALATION_SPRAY | Freq: Once | RESPIRATORY_TRACT | Status: DC

## 2015-09-10 NOTE — Telephone Encounter (Signed)
I have sent the prescription to pharmacy;will await need for PA

## 2015-09-14 ENCOUNTER — Other Ambulatory Visit: Payer: Self-pay | Admitting: Internal Medicine

## 2015-09-17 ENCOUNTER — Encounter: Payer: Self-pay | Admitting: Oncology

## 2015-10-01 ENCOUNTER — Ambulatory Visit (INDEPENDENT_AMBULATORY_CARE_PROVIDER_SITE_OTHER): Payer: Managed Care, Other (non HMO) | Admitting: Internal Medicine

## 2015-10-01 ENCOUNTER — Encounter: Payer: Self-pay | Admitting: Internal Medicine

## 2015-10-01 ENCOUNTER — Ambulatory Visit
Admission: RE | Admit: 2015-10-01 | Discharge: 2015-10-01 | Disposition: A | Discharge status: Home / Self Care | Payer: Managed Care, Other (non HMO) | Source: Ambulatory Visit | Attending: Internal Medicine | Admitting: Internal Medicine

## 2015-10-01 VITALS — BP 118/68 | HR 108 | Temp 98.0°F | Resp 24

## 2015-10-01 DIAGNOSIS — J189 Pneumonia, unspecified organism: Secondary | ICD-10-CM

## 2015-10-01 DIAGNOSIS — Z8709 Personal history of other diseases of the respiratory system: Secondary | ICD-10-CM

## 2015-10-01 DIAGNOSIS — R059 Cough, unspecified: Secondary | ICD-10-CM

## 2015-10-01 DIAGNOSIS — J040 Acute laryngitis: Secondary | ICD-10-CM

## 2015-10-01 DIAGNOSIS — R05 Cough: Secondary | ICD-10-CM

## 2015-10-01 DIAGNOSIS — R531 Weakness: Secondary | ICD-10-CM | POA: Diagnosis not present

## 2015-10-01 DIAGNOSIS — R112 Nausea with vomiting, unspecified: Secondary | ICD-10-CM

## 2015-10-01 DIAGNOSIS — J181 Lobar pneumonia, unspecified organism: Principal | ICD-10-CM

## 2015-10-01 MED ORDER — LEVOFLOXACIN 500 MG PO TABS: 500.0000 mg | ORAL_TABLET | Freq: Every day | ORAL | Status: DC

## 2015-10-01 MED ORDER — CEFTRIAXONE SODIUM 1 G IJ SOLR
1.0000 g | INTRAMUSCULAR | Status: AC
  Administered 2015-10-01: 1 g via INTRAMUSCULAR

## 2015-10-01 MED ORDER — ONDANSETRON HCL 4 MG PO TABS: 4.0000 mg | ORAL_TABLET | Freq: Three times a day (TID) | ORAL | Status: DC | PRN

## 2015-10-01 MED ORDER — ONDANSETRON HCL 4 MG/2ML IJ SOLN
4.0000 mg | Freq: Once | INTRAMUSCULAR | Status: AC
  Administered 2015-10-01: 4 mg via INTRAMUSCULAR

## 2015-10-01 NOTE — Progress Notes (Signed)
   Subjective:    Patient ID: Tara [name], female    DOB: [birth year], 60 y.o.   MRN: [id number]  HPI Patient in today late this afternoon with history of respiratory infection symptoms and cough that started about 2 days after she landed in [place] for vacation. She was there for 2 weeks. Thinks that may have caught something on the airplane. Has had hoarseness which recently started but prior to that had a lot of coughing congestion. Says she feels "terrible ". Tried to get seen at a clinic in [place] but apparently they had no available clinic  appointments at facilities which accepted her insurance and other facilities wanted  around the $195 upfront. She has deep congested cough. Complaining of left-sided rib pain with coughing. After returning from x-ray, she went immediately to the restroom and said she was nauseated. She vomited once. Currently no shaking chills. Complaining of weakness. Only ate yogurt and a banana today. Complaining of ear pain.    Review of Systems as above     Objective:   Physical Exam Skin: pale warm and dry. Her voice is hoarse. TMs are clear. Neck is supple without adenopathy. Chest: no wheezing appreciated. Pulse oximetry is within normal limits. Some coarse breath sounds that clear with coughing. Chest x-ray shows retrocardiac density consistent with early left lower lobe pneumonia.       Assessment & Plan:  Left lower lobe pneumonia  Laryngitis  Left chest wall pain secondary to coughing  Nausea  Weakness  Plan: 1 g IM Rocephin given in office. CBC with differential drawn and pending. Levaquin 500 milligrams daily for 10 days. Return Friday, February 3 for follow-up here. If gets worse she is to go to hospital. Is to drink plenty of fluids and try to eat something regularly. Zofran tablets 4 mg 1 by mouth every 8 hours when necessary nausea. I called her husband and had him come pick her up since she was feeling poorly after vomiting here in the office.  Explained to him her diagnosis of what we were doing to treat pneumonia. Patient has inhalers at home which she should use on a regular basis. Really advised her not to do very much. She wanted to walk the dog but this not a good idea right now. She needs to rest at home.  25 minutes spent with patient and her husband

## 2015-10-01 NOTE — Patient Instructions (Signed)
Take Levaquin 500 milligrams daily for 10 days. Use inhalers as directed. Zofran as needed for nausea. Rocephin 1 g IM given in office. Zofran injection given office. Take Zofran tablets as needed for nausea. Rest in bed and drink plenty of fluids return on Friday, February 3

## 2015-10-02 LAB — CBC WITH DIFFERENTIAL/PLATELET
Basophils Absolute: 0 10*3/uL (ref 0.0–0.1)
Basophils Relative: 0 % (ref 0–1)
Eosinophils Absolute: 0 10*3/uL (ref 0.0–0.7)
Eosinophils Relative: 0 % (ref 0–5)
HCT: 39.9 % (ref 36.0–46.0)
Hemoglobin: 13.4 g/dL (ref 12.0–15.0)
Lymphocytes Relative: 6 % — ABNORMAL LOW (ref 12–46)
Lymphs Abs: 0.9 10*3/uL (ref 0.7–4.0)
MCH: 31.1 pg (ref 26.0–34.0)
MCHC: 33.6 g/dL (ref 30.0–36.0)
MCV: 92.6 fL (ref 78.0–100.0)
MPV: 11.8 fL (ref 8.6–12.4)
Monocytes Absolute: 1.2 10*3/uL — ABNORMAL HIGH (ref 0.1–1.0)
Monocytes Relative: 8 % (ref 3–12)
Neutro Abs: 13 10*3/uL — ABNORMAL HIGH (ref 1.7–7.7)
Neutrophils Relative %: 86 % — ABNORMAL HIGH (ref 43–77)
Platelets: 237 10*3/uL (ref 150–400)
RBC: 4.31 MIL/uL (ref 3.87–5.11)
RDW: 14.2 % (ref 11.5–15.5)
WBC: 15.1 10*3/uL — ABNORMAL HIGH (ref 4.0–10.5)

## 2015-10-04 ENCOUNTER — Encounter: Payer: Self-pay | Admitting: Internal Medicine

## 2015-10-04 ENCOUNTER — Ambulatory Visit (INDEPENDENT_AMBULATORY_CARE_PROVIDER_SITE_OTHER): Payer: Managed Care, Other (non HMO) | Admitting: Internal Medicine

## 2015-10-04 VITALS — BP 112/64 | HR 95 | Temp 97.8°F | Resp 22 | Wt 225.0 lb

## 2015-10-04 DIAGNOSIS — J189 Pneumonia, unspecified organism: Secondary | ICD-10-CM

## 2015-10-04 DIAGNOSIS — J181 Lobar pneumonia, unspecified organism: Principal | ICD-10-CM

## 2015-10-04 DIAGNOSIS — J040 Acute laryngitis: Secondary | ICD-10-CM | POA: Diagnosis not present

## 2015-10-04 MED ORDER — CEFTRIAXONE SODIUM 1 G IJ SOLR: 1.0000 g | Freq: Once | INTRAMUSCULAR | Status: DC

## 2015-10-04 MED ORDER — CEFTRIAXONE SODIUM 1 G IJ SOLR
1.0000 g | INTRAMUSCULAR | Status: AC
Start: 2015-10-04 — End: 2015-10-04
  Administered 2015-10-04: 1 g via INTRAMUSCULAR

## 2015-10-04 NOTE — Patient Instructions (Addendum)
Continue Levaquin which will be completed on February 8. Rest at home drink plenty of fluids. RTC Feb 14 or sooner if worse. Rocephin 1 g IM given in office today.

## 2015-10-04 NOTE — Progress Notes (Signed)
   Subjective:    Patient ID: Tara [name], female    DOB: 1955/10/14, 60 y.o.   MRN: [id number]  HPI Patient in today for recheck following diagnosis of left lower lobe pneumonia on Tuesday, January 31. Was given IM Rocephin at time of diagnosis and started on Levaquin. Still feels weak and washed out. Considerable coughing. Malaise and fatigue. Has laryngitis as well. Decreased appetite. No shaking chills. Says ears feel stopped up    Review of Systems see above     Objective:   Physical Exam Skin warm and dry. Nodes none. HEENT exam is unremarkable. Neck is supple without significant adenopathy. Chest fairly clear to auscultation       Assessment & Plan:  Left lower lobe pneumonia  Laryngitis  Plan: Continue Levaquin 500 milligrams daily and return February 16. Will have chest x-ray prior to office visit in that time. Rest and drink plenty of fluids.

## 2015-10-09 ENCOUNTER — Telehealth: Payer: Self-pay

## 2015-10-09 DIAGNOSIS — J189 Pneumonia, unspecified organism: Secondary | ICD-10-CM

## 2015-10-09 DIAGNOSIS — J181 Lobar pneumonia, unspecified organism: Principal | ICD-10-CM

## 2015-10-09 MED ORDER — LEVOFLOXACIN 500 MG PO TABS: 500.0000 mg | ORAL_TABLET | Freq: Every day | ORAL | Status: DC

## 2015-10-09 NOTE — Telephone Encounter (Signed)
#  7 sent to pharmacy per Sharyn Lull who states that Dr. Renold Genta and her had discussed her having another round of levaquin for 7 days and repeat appt on Tues 2/14

## 2015-10-09 NOTE — Telephone Encounter (Signed)
Patient states that she has one dose of her levaquin left to take tomorrow 10/10/15 and she will be done. She states that she still has no voice and she is still coughing and states that she thinks she needs another round of antibiotics.

## 2015-10-14 ENCOUNTER — Other Ambulatory Visit: Payer: BLUE CROSS/BLUE SHIELD | Admitting: Internal Medicine

## 2015-10-15 ENCOUNTER — Ambulatory Visit: Payer: BLUE CROSS/BLUE SHIELD | Admitting: Internal Medicine

## 2015-10-16 ENCOUNTER — Ambulatory Visit
Admission: RE | Admit: 2015-10-16 | Discharge: 2015-10-16 | Disposition: A | Discharge status: Home / Self Care | Payer: Managed Care, Other (non HMO) | Source: Ambulatory Visit | Attending: Internal Medicine | Admitting: Internal Medicine

## 2015-10-16 ENCOUNTER — Telehealth: Payer: Self-pay | Admitting: Internal Medicine

## 2015-10-16 DIAGNOSIS — R05 Cough: Secondary | ICD-10-CM

## 2015-10-16 DIAGNOSIS — J189 Pneumonia, unspecified organism: Secondary | ICD-10-CM

## 2015-10-16 DIAGNOSIS — R059 Cough, unspecified: Secondary | ICD-10-CM

## 2015-10-16 NOTE — Telephone Encounter (Signed)
States she has one more pill of antibiotic.  She is whispering when she calls.  States that her cough is non productive, not hacky.  She has no fever.  States she still has head congestion and drainage from her sinuses.  States that she feels that she may have cracked a rib from coughing.  Not sure that patient understands the ramifications of taking too many antibiotics at one time.  She seems to want another antibiotic because she is continuing to have head congestion and laryngitis and the cough.    Please advise.

## 2015-10-16 NOTE — Telephone Encounter (Signed)
Patient advised that we will follow up with her in the office tomorrow at 12:00 and that she will need to go get CXR and rib detail (right) prior to her appointment with [name]. Patient is in agreement with this plan.

## 2015-10-17 ENCOUNTER — Encounter: Payer: Self-pay | Admitting: Internal Medicine

## 2015-10-17 ENCOUNTER — Telehealth: Payer: Self-pay | Admitting: Internal Medicine

## 2015-10-17 ENCOUNTER — Ambulatory Visit (INDEPENDENT_AMBULATORY_CARE_PROVIDER_SITE_OTHER): Payer: Managed Care, Other (non HMO) | Admitting: Internal Medicine

## 2015-10-17 VITALS — BP 118/66 | HR 74 | Temp 97.9°F | Resp 22 | Ht 66.0 in | Wt 212.5 lb

## 2015-10-17 DIAGNOSIS — J040 Acute laryngitis: Secondary | ICD-10-CM

## 2015-10-17 DIAGNOSIS — J189 Pneumonia, unspecified organism: Secondary | ICD-10-CM

## 2015-10-17 DIAGNOSIS — J181 Lobar pneumonia, unspecified organism: Principal | ICD-10-CM

## 2015-10-17 NOTE — Patient Instructions (Signed)
Please see ENT for evaluation of laryngitis. Rest and drink plenty of fluids. No more antibiotics prescribed today. Follow-up in 2 weeks.

## 2015-10-17 NOTE — Progress Notes (Signed)
   Subjective:    Patient ID: Tara [name], female    DOB: [birth year], 60 y.o.   MRN: [id number]  HPI Patient was treated recently for left lower lobe pneumonia with Levaquin for 10 days and an additional 7 days was prescribed recently. She still complaining of laryngitis. Speaks in a whisper. Says her head still is quite congested. She called complaining of some chest wall pain and said she thought she had a broken rib from coughing. Rib detail films are negative for fracture in her repeat chest x-ray is negative today for pneumonia. Says she still feels tired and weak. Initially treated here January 31. She had been in [place] and became ill during that trip.   Review of Systems     Objective:   Physical Exam  She continues to cough in the office. Cough sounds a bit croupy. TMs are clear. Pharynx is clear. She speaks in a whisper. Neck is supple without adenopathy. Chest clear to auscultation without rales      Assessment & Plan:  Left lower lobe pneumonia-resolved  Laryngitis-have evaluation by ENT physician  Chest wall pain-patient reassured  Plan: I do think antibiotics are going to help anymore at this point since her pneumonia has resolved. We'll have ENT check out her vocal cords. See her again in 2 weeks for follow-up. Explained her she could feel fatigued for another 2 weeks.

## 2015-10-17 NOTE — Telephone Encounter (Signed)
Appointment made with Dr. Erik Obey for Dx:  Laryngitis.  11/05/15 @ 10:00 [address] [phone]   (fax [phone])  Spoke with patient and provided appointment info, date/time, address.  Patient wanted to know if she doesn't have the laryngitis at the time of the appointment if she is to still go?  Advised patient that can determine if she wants to keep the appointment.  She can call Dr. Noreene Filbert office daily to see if they have any cancellations and to see if she can get her appointment date moved up any.  Patient verbalized understanding of these instructions.

## 2015-10-28 ENCOUNTER — Ambulatory Visit (INDEPENDENT_AMBULATORY_CARE_PROVIDER_SITE_OTHER): Payer: Managed Care, Other (non HMO) | Admitting: Internal Medicine

## 2015-10-28 ENCOUNTER — Encounter: Payer: Self-pay | Admitting: Internal Medicine

## 2015-10-28 VITALS — BP 122/84 | HR 64 | Temp 97.2°F | Resp 18 | Ht 66.0 in | Wt 230.5 lb

## 2015-10-28 DIAGNOSIS — J189 Pneumonia, unspecified organism: Secondary | ICD-10-CM

## 2015-10-28 DIAGNOSIS — F411 Generalized anxiety disorder: Secondary | ICD-10-CM | POA: Diagnosis not present

## 2015-10-28 DIAGNOSIS — E039 Hypothyroidism, unspecified: Secondary | ICD-10-CM

## 2015-10-28 DIAGNOSIS — R7302 Impaired glucose tolerance (oral): Secondary | ICD-10-CM

## 2015-10-28 DIAGNOSIS — J040 Acute laryngitis: Secondary | ICD-10-CM | POA: Diagnosis not present

## 2015-10-28 DIAGNOSIS — J181 Lobar pneumonia, unspecified organism: Secondary | ICD-10-CM

## 2015-10-28 MED ORDER — PREDNISONE 10 MG PO TABS: ORAL_TABLET | ORAL | Status: DC

## 2015-10-28 NOTE — Progress Notes (Signed)
   Subjective:    Patient ID: Tara [name], female    DOB: 1955/09/25, 60 y.o.   MRN: [id number]  HPI Still hoarse. Has appt with ENT March 7th. Here today for 6 month recheck on impaired glucose tolerance and hypothyroidism. Hemoglobin AIC and TSH drawn. Not able to exercise recently due to bout of pneumonia which she contracted on trip to [place]. C/o right ear congestion. Worried about hoarseness.    Review of Systems     Objective:   Physical Exam  Skin warm and dry. Nodes none. TMs are chronically scarred bilaterally but not red. Pharynx is clear. Neck is supple without thyromegaly. Chest clear to auscultation without rales or wheezing. She remains hoarse.      Assessment & Plan:  Persistent hoarseness  Left lower lobe pneumonia-resolved  Hypothyroidism  Impaired glucose tolerance-did [facility] but has not been following a strict diet recently and has been unable to exercise with recent bout of pneumonia  Obesity  Anxiety  Plan: Sterapred DS 10 mg 6 day dosepak. TSH and hemoglobin A1c pending. Physical exam due after 06/30/2016. To see ENT physician about persistent hoarseness March 7.

## 2015-10-28 NOTE — Patient Instructions (Addendum)
See ENT physician March 7. Take Sterapred DS 10 mg 6 day Dosepak for hoarseness. Hemoglobin A1c and TSH drawn today. Physical exam due after October 31.

## 2015-10-29 ENCOUNTER — Telehealth: Payer: Self-pay

## 2015-10-29 LAB — HEMOGLOBIN A1C
Hgb A1c MFr Bld: 6 % — ABNORMAL HIGH (ref <5.7)
Mean Plasma Glucose: 126 mg/dL — ABNORMAL HIGH (ref <117)

## 2015-10-29 MED ORDER — BUDESONIDE-FORMOTEROL FUMARATE 160-4.5 MCG/ACT IN AERO: 2.0000 | INHALATION_SPRAY | Freq: Two times a day (BID) | RESPIRATORY_TRACT | Status: DC

## 2015-10-29 NOTE — Telephone Encounter (Signed)
Patient states that despite the prior authorization the Pulmicort inhaler is still going to cost her $200 a month, which they cannot afford. Is there another alternative besides Advair as that causes loss of voice and shortness of breath.

## 2015-10-29 NOTE — Telephone Encounter (Signed)
Symbicort 160? Pt needs to call her insurance company and see what is on formulary-I do not know what is covered. We have been through this before with her.

## 2015-10-29 NOTE — Telephone Encounter (Signed)
Prescription sent to pharmacy for symbicort- patient

## 2015-10-31 ENCOUNTER — Other Ambulatory Visit: Payer: Self-pay

## 2015-10-31 MED ORDER — BECLOMETHASONE DIPROPIONATE 80 MCG/ACT IN AERS: 2.0000 | INHALATION_SPRAY | Freq: Once | RESPIRATORY_TRACT | Status: DC

## 2015-10-31 NOTE — Telephone Encounter (Signed)
Per Dr. Renold Genta 32mcg BID

## 2015-10-31 NOTE — Telephone Encounter (Signed)
?   Dr. Renold Genta

## 2015-11-05 DIAGNOSIS — K219 Gastro-esophageal reflux disease without esophagitis: Secondary | ICD-10-CM | POA: Insufficient documentation

## 2015-11-05 DIAGNOSIS — R490 Dysphonia: Secondary | ICD-10-CM | POA: Insufficient documentation

## 2015-11-05 HISTORY — DX: Gastro-esophageal reflux disease without esophagitis: K21.9

## 2015-11-28 ENCOUNTER — Other Ambulatory Visit: Payer: Self-pay | Admitting: Internal Medicine

## 2015-12-06 ENCOUNTER — Other Ambulatory Visit: Payer: Self-pay | Admitting: Internal Medicine

## 2015-12-07 ENCOUNTER — Other Ambulatory Visit: Payer: Self-pay | Admitting: Internal Medicine

## 2016-01-07 ENCOUNTER — Encounter: Payer: Self-pay | Admitting: Internal Medicine

## 2016-01-28 ENCOUNTER — Encounter: Payer: Self-pay | Admitting: Internal Medicine

## 2016-02-05 ENCOUNTER — Other Ambulatory Visit: Payer: Self-pay | Admitting: Internal Medicine

## 2016-02-19 ENCOUNTER — Other Ambulatory Visit: Payer: Self-pay | Admitting: Internal Medicine

## 2016-03-16 ENCOUNTER — Encounter: Payer: Self-pay | Admitting: Internal Medicine

## 2016-03-24 ENCOUNTER — Ambulatory Visit
Admission: RE | Admit: 2016-03-24 | Discharge: 2016-03-24 | Disposition: A | Discharge status: Home / Self Care | Payer: 59 | Source: Ambulatory Visit | Attending: Internal Medicine | Admitting: Internal Medicine

## 2016-03-24 ENCOUNTER — Other Ambulatory Visit: Payer: Self-pay | Admitting: Internal Medicine

## 2016-03-24 ENCOUNTER — Ambulatory Visit (INDEPENDENT_AMBULATORY_CARE_PROVIDER_SITE_OTHER): Payer: Managed Care, Other (non HMO) | Admitting: Internal Medicine

## 2016-03-24 ENCOUNTER — Encounter: Payer: Self-pay | Admitting: Internal Medicine

## 2016-03-24 VITALS — BP 112/70 | HR 74 | Temp 97.0°F | Ht 66.0 in | Wt 219.0 lb

## 2016-03-24 DIAGNOSIS — F411 Generalized anxiety disorder: Secondary | ICD-10-CM

## 2016-03-24 DIAGNOSIS — S5001XA Contusion of right elbow, initial encounter: Secondary | ICD-10-CM | POA: Diagnosis not present

## 2016-03-24 DIAGNOSIS — R296 Repeated falls: Secondary | ICD-10-CM

## 2016-03-24 DIAGNOSIS — R0781 Pleurodynia: Secondary | ICD-10-CM | POA: Diagnosis not present

## 2016-03-24 DIAGNOSIS — S2231XA Fracture of one rib, right side, initial encounter for closed fracture: Secondary | ICD-10-CM

## 2016-03-24 DIAGNOSIS — S63619A Unspecified sprain of unspecified finger, initial encounter: Secondary | ICD-10-CM | POA: Diagnosis not present

## 2016-03-24 MED ORDER — HYDROCODONE-ACETAMINOPHEN 5-325 MG PO TABS: 1.0000 | ORAL_TABLET | Freq: Four times a day (QID) | ORAL | 0 refills | Status: DC | PRN

## 2016-03-24 NOTE — Patient Instructions (Addendum)
Xray right rib cage area. Norco 5/325 one by mouth every 6-8 hours when necessary pain #30 no refill. Ice to right rib cage area.  Addendum: Patient notified she has a nondisplaced right eighth rib fracture. This should heal in 4-6 weeks.

## 2016-03-24 NOTE — Progress Notes (Signed)
   Subjective:    Patient ID: Tara [name], female    DOB: 05-04-1956, 60 y.o.   MRN: [id number]  HPI Patient presents today for evaluation after a fall at her home getting up to go to the bathroom in the middle of the night some 6 days ago. She is complaining of right sided rib cage pain. Think she may have broken a rib. She also injured her right elbow and apparently sprained 2 fingers on her right hand. She did not strike her head or lose consciousness. Says she lost her balance. This was during the time she was suffering from Norovirus. Patient has a lot of anxiety about her health. Says that she's had another fall recently as well. Her falls seem to be related to losing her balance    Review of Systems see above     Objective:   Physical Exam She is alert and oriented 3. Has no facial bruising or contusions. Has a contusion about her right elbow but good range of motion of the right elbow and right upper extremity. Has contused areas right third and fourth fingers. Has contusion around the lower right rib cage area. X-ray shows nondisplaced eighth rib fracture on the right without pneumothorax       Assessment & Plan:  Right eighth rib fracture  Contused right elbow  Contusions right third and fourth fingers  Frequent falls-related to losing balance  Morbid obesity  Anxiety  Plan: Hydrocodone/APAP 5/325 one by mouth every 8 hours when necessary pain. It's point to take rehabilitation 4-6 weeks to heal. May benefit from some balance/postural training by physical therapy.

## 2016-03-25 ENCOUNTER — Encounter: Payer: Self-pay | Admitting: Internal Medicine

## 2016-03-25 ENCOUNTER — Telehealth: Payer: Self-pay

## 2016-03-25 NOTE — Telephone Encounter (Signed)
Called patient. Gave image results . Patient verbalized understanding.

## 2016-03-25 NOTE — Telephone Encounter (Signed)
-----   Message from [name], MD sent at 03/24/2016  2:39 PM EDT ----- Please call patient and tell her she has a nondisplaced right eighth rib fracture which should heal in 4-6 weeks.

## 2016-03-29 ENCOUNTER — Observation Stay (HOSPITAL_COMMUNITY)
Admission: EM | Admit: 2016-03-29 | Discharge: 2016-03-31 | Disposition: A | Discharge status: Home / Self Care | Payer: Managed Care, Other (non HMO) | Attending: Internal Medicine | Admitting: Internal Medicine

## 2016-03-29 ENCOUNTER — Emergency Department (HOSPITAL_COMMUNITY): Payer: Managed Care, Other (non HMO)

## 2016-03-29 ENCOUNTER — Encounter (HOSPITAL_COMMUNITY): Payer: Self-pay

## 2016-03-29 DIAGNOSIS — R197 Diarrhea, unspecified: Secondary | ICD-10-CM | POA: Diagnosis not present

## 2016-03-29 DIAGNOSIS — E876 Hypokalemia: Principal | ICD-10-CM | POA: Diagnosis present

## 2016-03-29 DIAGNOSIS — F419 Anxiety disorder, unspecified: Secondary | ICD-10-CM | POA: Insufficient documentation

## 2016-03-29 DIAGNOSIS — Z923 Personal history of irradiation: Secondary | ICD-10-CM | POA: Diagnosis not present

## 2016-03-29 DIAGNOSIS — Z853 Personal history of malignant neoplasm of breast: Secondary | ICD-10-CM | POA: Diagnosis not present

## 2016-03-29 DIAGNOSIS — E039 Hypothyroidism, unspecified: Secondary | ICD-10-CM | POA: Diagnosis not present

## 2016-03-29 DIAGNOSIS — Z96643 Presence of artificial hip joint, bilateral: Secondary | ICD-10-CM | POA: Diagnosis not present

## 2016-03-29 DIAGNOSIS — J45909 Unspecified asthma, uncomplicated: Secondary | ICD-10-CM | POA: Diagnosis not present

## 2016-03-29 DIAGNOSIS — Z79899 Other long term (current) drug therapy: Secondary | ICD-10-CM | POA: Diagnosis not present

## 2016-03-29 DIAGNOSIS — R202 Paresthesia of skin: Secondary | ICD-10-CM | POA: Diagnosis present

## 2016-03-29 DIAGNOSIS — R209 Unspecified disturbances of skin sensation: Secondary | ICD-10-CM | POA: Diagnosis present

## 2016-03-29 LAB — CBC WITH DIFFERENTIAL/PLATELET
Basophils Absolute: 0 10*3/uL (ref 0.0–0.1)
Basophils Relative: 0 %
Eosinophils Absolute: 0.1 10*3/uL (ref 0.0–0.7)
Eosinophils Relative: 1 %
HCT: 39.8 % (ref 36.0–46.0)
Hemoglobin: 13.3 g/dL (ref 12.0–15.0)
Lymphocytes Relative: 9 %
Lymphs Abs: 1 10*3/uL (ref 0.7–4.0)
MCH: 30.9 pg (ref 26.0–34.0)
MCHC: 33.4 g/dL (ref 30.0–36.0)
MCV: 92.3 fL (ref 78.0–100.0)
Monocytes Absolute: 0.8 10*3/uL (ref 0.1–1.0)
Monocytes Relative: 8 %
Neutro Abs: 8.3 10*3/uL — ABNORMAL HIGH (ref 1.7–7.7)
Neutrophils Relative %: 82 %
Platelets: 161 10*3/uL (ref 150–400)
RBC: 4.31 MIL/uL (ref 3.87–5.11)
RDW: 13.8 % (ref 11.5–15.5)
WBC: 10.1 10*3/uL (ref 4.0–10.5)

## 2016-03-29 LAB — URINE MICROSCOPIC-ADD ON

## 2016-03-29 LAB — URINALYSIS, ROUTINE W REFLEX MICROSCOPIC
Glucose, UA: NEGATIVE mg/dL
Hgb urine dipstick: NEGATIVE
Ketones, ur: 15 mg/dL — AB
Nitrite: NEGATIVE
Protein, ur: NEGATIVE mg/dL
Specific Gravity, Urine: 1.019 (ref 1.005–1.030)
pH: 5.5 (ref 5.0–8.0)

## 2016-03-29 LAB — COMPREHENSIVE METABOLIC PANEL
ALT: 36 U/L (ref 14–54)
AST: 42 U/L — ABNORMAL HIGH (ref 15–41)
Albumin: 4.3 g/dL (ref 3.5–5.0)
Alkaline Phosphatase: 81 U/L (ref 38–126)
Anion gap: 20 — ABNORMAL HIGH (ref 5–15)
BUN: 17 mg/dL (ref 6–20)
CO2: 27 mmol/L (ref 22–32)
Calcium: 7.9 mg/dL — ABNORMAL LOW (ref 8.9–10.3)
Chloride: 88 mmol/L — ABNORMAL LOW (ref 101–111)
Creatinine, Ser: 1.1 mg/dL — ABNORMAL HIGH (ref 0.44–1.00)
GFR calc Af Amer: >60 mL/min (ref >60)
GFR calc non Af Amer: 53 mL/min — ABNORMAL LOW (ref >60)
Glucose, Bld: 138 mg/dL — ABNORMAL HIGH (ref 65–99)
Potassium: 2.7 mmol/L — CL (ref 3.5–5.1)
Sodium: 135 mmol/L (ref 135–145)
Total Bilirubin: 1.2 mg/dL (ref 0.3–1.2)
Total Protein: 7.4 g/dL (ref 6.5–8.1)

## 2016-03-29 LAB — DIGOXIN LEVEL: Digoxin Level: 0.7 ng/mL — ABNORMAL LOW (ref 0.8–2.0)

## 2016-03-29 LAB — MAGNESIUM: Magnesium: 1.1 mg/dL — ABNORMAL LOW (ref 1.7–2.4)

## 2016-03-29 MED ORDER — POTASSIUM CHLORIDE 10 MEQ/100ML IV SOLN
10.0000 meq | INTRAVENOUS | Status: AC
Start: 2016-03-29 — End: 2016-03-30
  Administered 2016-03-29 – 2016-03-30 (×4): 10 meq via INTRAVENOUS
  Filled 2016-03-29 (×2): qty 100

## 2016-03-29 MED ORDER — SODIUM CHLORIDE 0.9 % IV BOLUS (SEPSIS)
1000.0000 mL | Freq: Once | INTRAVENOUS | Status: AC
  Administered 2016-03-29: 1000 mL via INTRAVENOUS

## 2016-03-29 MED ORDER — MAGNESIUM SULFATE 50 % IJ SOLN: 2.0000 g | Freq: Once | INTRAMUSCULAR | Status: DC

## 2016-03-29 MED ORDER — MAGNESIUM SULFATE 2 GM/50ML IV SOLN
2.0000 g | Freq: Once | INTRAVENOUS | Status: AC
  Administered 2016-03-29: 2 g via INTRAVENOUS
  Filled 2016-03-29: qty 50

## 2016-03-29 NOTE — ED Provider Notes (Signed)
[place] DEPT Provider Note   CSN: [id number] Arrival date & time: 03/29/16  [id number]  First Provider Contact:  First MD Initiated Contact with Patient 03/29/16 2023        History   Chief Complaint Chief Complaint  Patient presents with  . Numbness  . Excessive Sweating    HPI Miraya Hartinger is a 60 y.o. female.  The history is provided by the patient. No language interpreter was used.    Rayn Woodington is a 60 y.o. female who presents to the Emergency Department complaining of numbness, sweating.  She developed tingling around her mouth and bilateral hands yesterday. Symptoms worsened today and now she has developed sweating, tingling in her feet as well as well as cramping in bilateral hands. She has associated shortness of breath, speech difficulties, nausea. She denies any pain. She had norovirus a week ago she also fell several days ago and sustained a right rib fracture. She has a history of breast cancer, thought to be in remission as well as a history of hepatitis C and TTP.  Past Medical History:  Diagnosis Date  . Anxiety   . Arthritis   . Asthma   . Blood transfusion   . Breast cancer ([place]) 2012   left breast  . Chronic edema   . Complication of anesthesia    difficulty waking up/dizzy/lightheaded  . Dysrhythmia    irregular heartbeat, takes digoxin  . Environmental allergies   . H/O varicella   . H/O varicose veins   . Hepatitis C   . History of measles, mumps, or rubella   . Hx of radiation therapy 12/15/11 - 01/29/12   left breast  . Hypothyroidism   . Shortness of breath   . Thrombocytopenia, primary ([place])   . TTP (thrombotic thrombocytopenic purpura) [facility])     Patient Active Problem List   Diagnosis Date Noted  . Malignant neoplasm of axillary tail of left female breast ([place]) 08/12/2015  . Breast cancer of upper-outer quadrant of left female breast ([place]) 07/17/2014  . Hypokalemia 12/29/2013  . Allergic rhinitis 10/29/2012  . Unspecified asthma  10/29/2012  . Status post bilateral hip replacements 10/29/2012  . History of TTP (thrombotic thrombocytopenic purpura) 10/29/2012  . History of hepatitis C 10/29/2012  . Hx of radiation therapy   . Lymphedema of arm 12/15/2011  . Shortness of breath 11/19/2011  . Rosacea, acne 09/28/2011  . Dysrhythmia, cardiac 09/28/2011  . Hypothyroidism 09/07/2011    Past Surgical History:  Procedure Laterality Date  . BREAST LUMPECTOMY  08/03/11   left lumpectomy and slnbx,T1cN0,triple pos  . elbow pins Left   . FOOT SURGERY Left    multiple  . PORTACATH PLACEMENT  08/03/2011   Procedure: INSERTION PORT-A-CATH;  Surgeon: Merrie Roof, MD;  Location: [place];  Service: General;  Laterality: Right;  . TOTAL HIP ARTHROPLASTY Bilateral     OB History    Gravida Para Term Preterm AB Living   0 0 0 0 0 0   SAB TAB Ectopic Multiple Live Births   0 0 0 0         Home Medications    Prior to Admission medications   Medication Sig Start Date End Date Taking? Authorizing Provider  acetaminophen (TYLENOL) 500 MG tablet Take 500 mg by mouth every 6 (six) hours as needed for mild pain, moderate pain or headache.    Yes Historical Provider, MD  albuterol (PROVENTIL HFA;VENTOLIN HFA) 108 (90 BASE) MCG/ACT inhaler Inhale 2  puffs into the lungs every 6 (six) hours as needed for wheezing or shortness of breath.    Yes Historical Provider, MD  budesonide (PULMICORT) 180 MCG/ACT inhaler Inhale 1 puff into the lungs daily.   Yes Historical Provider, MD  cholecalciferol (VITAMIN D) 1000 UNITS tablet Take 3,000 Units by mouth daily.    Yes Historical Provider, MD  citalopram (CELEXA) 10 MG tablet Take 10 mg by mouth every evening.   Yes Historical Provider, MD  digoxin (LANOXIN) 0.25 MG tablet Take 1 tablet (0.25 mg total) by mouth daily. 02/18/15  Yes [name], MD  fluticasone (FLONASE) 50 MCG/ACT nasal spray Place 2 sprays into both nostrils daily as needed for rhinitis.   Yes Historical Provider, MD    glucosamine-chondroitin 500-400 MG tablet Take 1 tablet by mouth daily.    Yes Historical Provider, MD  letrozole (FEMARA) 2.5 MG tablet Take 1 tablet (2.5 mg total) by mouth daily. 08/19/15  Yes Chauncey Cruel, MD  levothyroxine (SYNTHROID, LEVOTHROID) 88 MCG tablet Take 88 mcg by mouth daily before breakfast.   Yes Historical Provider, MD  montelukast (SINGULAIR) 10 MG tablet Take 10 mg by mouth daily.   Yes Historical Provider, MD  Multiple Vitamin (MULTIVITAMIN WITH MINERALS) TABS tablet Take 1 tablet by mouth daily.   Yes Historical Provider, MD  ondansetron (ZOFRAN) 4 MG tablet Take 1 tablet (4 mg total) by mouth every 8 (eight) hours as needed for nausea or vomiting. 10/01/15  Yes [name], MD  potassium chloride SA (K-DUR,KLOR-CON) 20 MEQ tablet Take 20 mEq by mouth daily.   Yes Historical Provider, MD  torsemide (DEMADEX) 20 MG tablet Take 20 mg by mouth daily.   Yes Historical Provider, MD  vitamin B-12 (CYANOCOBALAMIN) 1000 MCG tablet Take 2,000 mcg by mouth daily.   Yes Historical Provider, MD    Family History Family History  Problem Relation Age of Onset  . COPD Father   . Brain cancer Maternal Grandfather   . Alcohol abuse Maternal Grandfather   . Heart disease Neg Hx   . Hyperlipidemia Neg Hx   . Hypertension Neg Hx     Social History Social History  Substance Use Topics  . Smoking status: Never Smoker  . Smokeless tobacco: Never Used  . Alcohol use 0.6 oz/week    1 Glasses of wine per week     Allergies   Aspirin and Nsaids   Review of Systems Review of Systems  All other systems reviewed and are negative.    Physical Exam Updated Vital Signs BP 126/67 (BP Location: Right Arm)   Pulse 72   Temp 98.8 F (37.1 C) (Oral)   Resp 23   Ht 5\' 7"  (1.702 m)   Wt 220 lb 7.4 oz (100 kg)   SpO2 95%   BMI 34.53 kg/m   Physical Exam  Constitutional: She is oriented to person, place, and time. She appears well-developed and well-nourished.  HENT:   Head: Normocephalic and atraumatic.  Cardiovascular: Normal rate and regular rhythm.   No murmur heard. Pulmonary/Chest: Effort normal and breath sounds normal. No respiratory distress.  Abdominal: Soft. There is no tenderness. There is no rebound and no guarding.  Musculoskeletal: She exhibits no edema or tenderness.  Carpal spasm bilaterally  Neurological: She is alert and oriented to person, place, and time.  Skin: Skin is warm and dry.  Psychiatric: She has a normal mood and affect. Her behavior is normal.  Nursing note and vitals reviewed.  ED Treatments / Results  Labs (all labs ordered are listed, but only abnormal results are displayed) Labs Reviewed  COMPREHENSIVE METABOLIC PANEL - Abnormal; Notable for the following:       Result Value   Potassium 2.7 (*)    Chloride 88 (*)    Glucose, Bld 138 (*)    Creatinine, Ser 1.10 (*)    Calcium 7.9 (*)    AST 42 (*)    GFR calc non Af Amer 53 (*)    Anion gap 20 (*)    All other components within normal limits  CBC WITH DIFFERENTIAL/PLATELET - Abnormal; Notable for the following:    Neutro Abs 8.3 (*)    All other components within normal limits  URINALYSIS, ROUTINE W REFLEX MICROSCOPIC (NOT AT [facility]) - Abnormal; Notable for the following:    APPearance CLOUDY (*)    Bilirubin Urine SMALL (*)    Ketones, ur 15 (*)    Leukocytes, UA TRACE (*)    All other components within normal limits  MAGNESIUM - Abnormal; Notable for the following:    Magnesium 1.1 (*)    All other components within normal limits  DIGOXIN LEVEL - Abnormal; Notable for the following:    Digoxin Level 0.7 (*)    All other components within normal limits  URINE MICROSCOPIC-ADD ON - Abnormal; Notable for the following:    Squamous Epithelial / LPF 0-5 (*)    Bacteria, UA RARE (*)    Casts HYALINE CASTS (*)    All other components within normal limits    EKG  EKG Interpretation  Date/Time:  Sunday March 29 2016 20:39:37 EDT Ventricular Rate:   79 PR Interval:    QRS Duration: 96 QT Interval:  419 QTC Calculation: 481 R Axis:   5 Text Interpretation:  Sinus or ectopic atrial rhythm Abnormal R-wave progression, early transition Confirmed by Hazle Coca [phone]) on 03/29/2016 9:04:45 PM       Radiology Dg Chest 2 View  Result Date: 03/29/2016 CLINICAL DATA:  Shortness of breath.  Nausea and vomiting. EXAM: CHEST  2 VIEW COMPARISON:  Chest and right rib radiographs 5 days prior 03/24/2016 FINDINGS: Increasing subsegmental atelectasis at the right lung base. Stable left basilar atelectasis or scarring. Right rib fracture on recent rib series is not well seen. No confluent airspace disease, pleural effusion or pneumothorax. Broad-based dextroscoliotic curvature of the thoracic spine. IMPRESSION: Increasing subsegmental right basilar atelectasis. Right rib fracture on recent rib series is not seen. No pneumothorax. Electronically Signed   By: Jeb Levering M.D.   On: 03/29/2016 21:36   Procedures Procedures (including critical care time)  Medications Ordered in ED Medications  potassium chloride 10 mEq in 100 mL IVPB (10 mEq Intravenous New Bag/Given 03/29/16 2210)  sodium chloride 0.9 % bolus 1,000 mL (1,000 mLs Intravenous New Bag/Given 03/29/16 2127)  magnesium sulfate IVPB 2 g 50 mL (0 g Intravenous Stopped 03/29/16 2209)     Initial Impression / Assessment and Plan / ED Course  I have reviewed the triage vital signs and the nursing notes.  Pertinent labs & imaging results that were available during my care of the patient were reviewed by me and considered in my medical decision making (see chart for details).  Clinical Course    Patient here for evaluation of paresthesias, diaphoresis, carpal spasm, speech difficulties. She has significant electrolyte abnormalities with hypomagnesemia, hypokalemia. Symptoms began to improve in the emergency department with replacement of her magnesium with decreased carpal spasm.  Plan to  admit for further workup and treatment.  Final Clinical Impressions(s) / ED Diagnoses   Final diagnoses:  None    New Prescriptions Current Discharge Medication List       Quintella Reichert, MD 03/29/16 2344

## 2016-03-29 NOTE — ED Triage Notes (Addendum)
Patient c/o tingling all over body that got worse since yesterday.  Patient states that started around mouth and hands.  Patient has TTP, Hep C from blood transfusion and Hx breast cancer. Patient  Able to smile even though stating that she having "hard time talk".

## 2016-03-29 NOTE — ED Notes (Signed)
MD at bedside. 

## 2016-03-29 NOTE — ED Notes (Signed)
Food and drink given per request approved by ER provider.

## 2016-03-30 ENCOUNTER — Encounter (HOSPITAL_COMMUNITY): Payer: Self-pay | Admitting: Internal Medicine

## 2016-03-30 DIAGNOSIS — J4520 Mild intermittent asthma, uncomplicated: Secondary | ICD-10-CM | POA: Diagnosis not present

## 2016-03-30 DIAGNOSIS — E876 Hypokalemia: Secondary | ICD-10-CM

## 2016-03-30 DIAGNOSIS — R202 Paresthesia of skin: Secondary | ICD-10-CM | POA: Diagnosis not present

## 2016-03-30 LAB — BASIC METABOLIC PANEL
Anion gap: 9 (ref 5–15)
BUN: 14 mg/dL (ref 6–20)
CO2: 33 mmol/L — ABNORMAL HIGH (ref 22–32)
Calcium: 7.5 mg/dL — ABNORMAL LOW (ref 8.9–10.3)
Chloride: 96 mmol/L — ABNORMAL LOW (ref 101–111)
Creatinine, Ser: 0.83 mg/dL (ref 0.44–1.00)
GFR calc Af Amer: >60 mL/min (ref >60)
GFR calc non Af Amer: >60 mL/min (ref >60)
Glucose, Bld: 139 mg/dL — ABNORMAL HIGH (ref 65–99)
Potassium: 3.2 mmol/L — ABNORMAL LOW (ref 3.5–5.1)
Sodium: 138 mmol/L (ref 135–145)

## 2016-03-30 LAB — CBC
HCT: 36.3 % (ref 36.0–46.0)
Hemoglobin: 12.1 g/dL (ref 12.0–15.0)
MCH: 30.8 pg (ref 26.0–34.0)
MCHC: 33.3 g/dL (ref 30.0–36.0)
MCV: 92.4 fL (ref 78.0–100.0)
Platelets: 167 10*3/uL (ref 150–400)
RBC: 3.93 MIL/uL (ref 3.87–5.11)
RDW: 14 % (ref 11.5–15.5)
WBC: 8.1 10*3/uL (ref 4.0–10.5)

## 2016-03-30 LAB — MAGNESIUM: Magnesium: 2.1 mg/dL (ref 1.7–2.4)

## 2016-03-30 LAB — DIGOXIN LEVEL: Digoxin Level: 0.8 ng/mL (ref 0.8–2.0)

## 2016-03-30 MED ORDER — MONTELUKAST SODIUM 10 MG PO TABS
10.0000 mg | ORAL_TABLET | Freq: Every day | ORAL | Status: DC
  Administered 2016-03-30 – 2016-03-31 (×2): 10 mg via ORAL
  Filled 2016-03-30 (×2): qty 1

## 2016-03-30 MED ORDER — LETROZOLE 2.5 MG PO TABS
2.5000 mg | ORAL_TABLET | Freq: Every day | ORAL | Status: DC
  Administered 2016-03-30 – 2016-03-31 (×2): 2.5 mg via ORAL
  Filled 2016-03-30 (×3): qty 1

## 2016-03-30 MED ORDER — SODIUM CHLORIDE 0.9 % IV SOLN
INTRAVENOUS | Status: AC
  Administered 2016-03-30 (×2): via INTRAVENOUS

## 2016-03-30 MED ORDER — CITALOPRAM HYDROBROMIDE 20 MG PO TABS
10.0000 mg | ORAL_TABLET | Freq: Every evening | ORAL | Status: DC
  Administered 2016-03-30: 10 mg via ORAL
  Filled 2016-03-30: qty 1

## 2016-03-30 MED ORDER — BUDESONIDE 0.5 MG/2ML IN SUSP
0.5000 mg | Freq: Two times a day (BID) | RESPIRATORY_TRACT | Status: DC
  Administered 2016-03-30 – 2016-03-31 (×3): 0.5 mg via RESPIRATORY_TRACT
  Filled 2016-03-30 (×3): qty 2

## 2016-03-30 MED ORDER — ACETAMINOPHEN 325 MG PO TABS: 650.0000 mg | ORAL_TABLET | Freq: Four times a day (QID) | ORAL | Status: DC | PRN

## 2016-03-30 MED ORDER — DIGOXIN 125 MCG PO TABS
0.2500 mg | ORAL_TABLET | Freq: Every day | ORAL | Status: DC
  Administered 2016-03-30 – 2016-03-31 (×2): 0.25 mg via ORAL
  Filled 2016-03-30 (×2): qty 2

## 2016-03-30 MED ORDER — FLUTICASONE PROPIONATE 50 MCG/ACT NA SUSP
2.0000 | Freq: Every day | NASAL | Status: DC | PRN
Start: 2016-03-30 — End: 2016-03-31
  Filled 2016-03-30: qty 16

## 2016-03-30 MED ORDER — LEVOTHYROXINE SODIUM 88 MCG PO TABS
88.0000 ug | ORAL_TABLET | Freq: Every day | ORAL | Status: DC
Start: 2016-03-30 — End: 2016-03-31
  Administered 2016-03-30 – 2016-03-31 (×2): 88 ug via ORAL
  Filled 2016-03-30 (×2): qty 1

## 2016-03-30 MED ORDER — BISACODYL 10 MG RE SUPP: 10.0000 mg | Freq: Every day | RECTAL | Status: DC | PRN

## 2016-03-30 MED ORDER — ALBUTEROL SULFATE (2.5 MG/3ML) 0.083% IN NEBU: 2.5000 mg | INHALATION_SOLUTION | RESPIRATORY_TRACT | Status: DC | PRN

## 2016-03-30 MED ORDER — ADULT MULTIVITAMIN W/MINERALS CH
1.0000 | ORAL_TABLET | Freq: Every day | ORAL | Status: DC
  Administered 2016-03-30 – 2016-03-31 (×2): 1 via ORAL
  Filled 2016-03-30 (×2): qty 1

## 2016-03-30 MED ORDER — ACETAMINOPHEN 500 MG PO TABS: 500.0000 mg | ORAL_TABLET | Freq: Four times a day (QID) | ORAL | Status: DC | PRN

## 2016-03-30 MED ORDER — POTASSIUM CHLORIDE CRYS ER 20 MEQ PO TBCR
20.0000 meq | EXTENDED_RELEASE_TABLET | Freq: Every day | ORAL | Status: DC
  Administered 2016-03-31: 20 meq via ORAL
  Filled 2016-03-30: qty 1

## 2016-03-30 MED ORDER — SENNOSIDES-DOCUSATE SODIUM 8.6-50 MG PO TABS: 1.0000 | ORAL_TABLET | Freq: Every evening | ORAL | Status: DC | PRN

## 2016-03-30 MED ORDER — VITAMIN B-12 1000 MCG PO TABS
2000.0000 ug | ORAL_TABLET | Freq: Every day | ORAL | Status: DC
  Administered 2016-03-30 – 2016-03-31 (×2): 2000 ug via ORAL
  Filled 2016-03-30 (×2): qty 2

## 2016-03-30 MED ORDER — POTASSIUM CHLORIDE CRYS ER 20 MEQ PO TBCR
40.0000 meq | EXTENDED_RELEASE_TABLET | Freq: Two times a day (BID) | ORAL | Status: AC
  Administered 2016-03-30 (×2): 40 meq via ORAL
  Filled 2016-03-30 (×2): qty 2

## 2016-03-30 MED ORDER — ENOXAPARIN SODIUM 60 MG/0.6ML ~~LOC~~ SOLN
50.0000 mg | SUBCUTANEOUS | Status: DC
Start: 2016-03-30 — End: 2016-03-31
  Administered 2016-03-30 – 2016-03-31 (×2): 50 mg via SUBCUTANEOUS
  Filled 2016-03-30 (×2): qty 0.6

## 2016-03-30 MED ORDER — KCL IN DEXTROSE-NACL 40-5-0.9 MEQ/L-%-% IV SOLN
INTRAVENOUS | Status: DC
  Administered 2016-03-30: 100 mL/h via INTRAVENOUS
  Filled 2016-03-30: qty 1000

## 2016-03-30 MED ORDER — ALBUTEROL SULFATE (2.5 MG/3ML) 0.083% IN NEBU: 2.5000 mg | INHALATION_SOLUTION | Freq: Four times a day (QID) | RESPIRATORY_TRACT | Status: DC | PRN

## 2016-03-30 MED ORDER — ONDANSETRON HCL 4 MG PO TABS: 4.0000 mg | ORAL_TABLET | Freq: Three times a day (TID) | ORAL | Status: DC | PRN

## 2016-03-30 NOTE — H&P (Signed)
History and Physical    Tara [name] [id number] DOB: 1956-03-23 DOA: 03/29/2016   PCP: [name], MD  Patient coming from: Home  Chief Complaint: Tingling and numbness  HPI: Tara [name] is a 60 y.o. female with multiple complicated medical history including but not limited to Bronchial Asthma, Dysrhythmia on Digoxin, with recent right rib fracture after an accidental fall at home.  She presented to [facility] ED with 2 day history of peri-oral numbness and tingling of her hands and feet. She had been seen and treayed by her pcp about 10 days ago for diarrhea which she said was due to "norovirus" per out-patient provider. She had come to ED due to worsening symptoms with concern for "stroke".   ED Course: Evaluation at ED was negative for CVA, and chest and Rib x-rays indicated increasing subsegmental right basilar atelectasis. Her chemistries indicated hypokalemia of 2.7 with hypomagnesemia of 1.1 and digoxin level of 0.7. She was given IV Normal Saline bolus with run of KCL as well as magnesium sulfate IVPB, and hospitalist called to evaluate for admission.  She denies current diarrhea, fever or chills, nor does she have any shortness of breath )at the time of admission though she had had some shortness of breadth few days earlier), nor extremity weakness or speech disturbance at this time   Review of Systems: As per HPI otherwise 10 point review of systems negative.  Past Medical History:  Diagnosis Date  . Anxiety   . Arthritis   . Asthma   . Blood transfusion   . Breast cancer ([place]) 2012   left breast  . Chronic edema   . Complication of anesthesia    difficulty waking up/dizzy/lightheaded  . Dysrhythmia    irregular heartbeat, takes digoxin  . Environmental allergies   . H/O varicella   . H/O varicose veins   . Hepatitis C   . History of measles, mumps, or rubella   . Hx of radiation therapy 12/15/11 - 01/29/12   left breast  . Hypothyroidism   . Shortness of breath   .  Thrombocytopenia, primary ([place])   . TTP (thrombotic thrombocytopenic purpura) ([facility])     Past Surgical History:  Procedure Laterality Date  . BREAST LUMPECTOMY  08/03/11   left lumpectomy and slnbx,T1cN0,triple pos  . elbow pins Left   . FOOT SURGERY Left    multiple  . PORTACATH PLACEMENT  08/03/2011   Procedure: INSERTION PORT-A-CATH;  Surgeon: Merrie Roof, MD;  Location: [place];  Service: General;  Laterality: Right;  . TOTAL HIP ARTHROPLASTY Bilateral      reports that she has never smoked. She has never used smokeless tobacco. She reports that she drinks about 0.6 oz of alcohol per week . She reports that she does not use drugs.  Allergies  Allergen Reactions  . Aspirin Other (See Comments)    Pt has a hx of TTP.   . Nsaids Other (See Comments)    Pt has a hx of TTP.     Family History  Problem Relation Age of Onset  . COPD Father   . Brain cancer Maternal Grandfather   . Alcohol abuse Maternal Grandfather   . Heart disease Neg Hx   . Hyperlipidemia Neg Hx   . Hypertension Neg Hx      Prior to Admission medications   Medication Sig Start Date End Date Taking? Authorizing Provider  acetaminophen (TYLENOL) 500 MG tablet Take 500 mg by mouth every 6 (six) hours as needed  for mild pain, moderate pain or headache.    Yes Historical Provider, MD  albuterol (PROVENTIL HFA;VENTOLIN HFA) 108 (90 BASE) MCG/ACT inhaler Inhale 2 puffs into the lungs every 6 (six) hours as needed for wheezing or shortness of breath.    Yes Historical Provider, MD  budesonide (PULMICORT) 180 MCG/ACT inhaler Inhale 1 puff into the lungs daily.   Yes Historical Provider, MD  cholecalciferol (VITAMIN D) 1000 UNITS tablet Take 3,000 Units by mouth daily.    Yes Historical Provider, MD  citalopram (CELEXA) 10 MG tablet Take 10 mg by mouth every evening.   Yes Historical Provider, MD  digoxin (LANOXIN) 0.25 MG tablet Take 1 tablet (0.25 mg total) by mouth daily. 02/18/15  Yes [name], MD    fluticasone (FLONASE) 50 MCG/ACT nasal spray Place 2 sprays into both nostrils daily as needed for rhinitis.   Yes Historical Provider, MD  glucosamine-chondroitin 500-400 MG tablet Take 1 tablet by mouth daily.    Yes Historical Provider, MD  letrozole (FEMARA) 2.5 MG tablet Take 1 tablet (2.5 mg total) by mouth daily. 08/19/15  Yes Chauncey Cruel, MD  levothyroxine (SYNTHROID, LEVOTHROID) 88 MCG tablet Take 88 mcg by mouth daily before breakfast.   Yes Historical Provider, MD  montelukast (SINGULAIR) 10 MG tablet Take 10 mg by mouth daily.   Yes Historical Provider, MD  Multiple Vitamin (MULTIVITAMIN WITH MINERALS) TABS tablet Take 1 tablet by mouth daily.   Yes Historical Provider, MD  ondansetron (ZOFRAN) 4 MG tablet Take 1 tablet (4 mg total) by mouth every 8 (eight) hours as needed for nausea or vomiting. 10/01/15  Yes [name], MD  potassium chloride SA (K-DUR,KLOR-CON) 20 MEQ tablet Take 20 mEq by mouth daily.   Yes Historical Provider, MD  torsemide (DEMADEX) 20 MG tablet Take 20 mg by mouth daily.   Yes Historical Provider, MD  vitamin B-12 (CYANOCOBALAMIN) 1000 MCG tablet Take 2,000 mcg by mouth daily.   Yes Historical Provider, MD    Physical Exam: Vitals:   03/29/16 1841 03/29/16 2020 03/29/16 2128 03/29/16 2329  BP: 112/88 144/73 126/67 120/64  Pulse: 98 82 72 74  Resp: (!) 30 17 23 18   Temp: 97.9 F (36.6 C) 98.8 F (37.1 C)  98 F (36.7 C)  TempSrc: Oral Oral  Oral  SpO2: 97% 97% 95% 97%  Weight:  97.1 kg (214 lb)  100 kg (220 lb 7.4 oz)  Height:  5\' 7"  (1.702 m)  5\' 7"  (1.702 m)      Constitutional: NAD, calm, comfortable Vitals:   03/29/16 1841 03/29/16 2020 03/29/16 2128 03/29/16 2329  BP: 112/88 144/73 126/67 120/64  Pulse: 98 82 72 74  Resp: (!) 30 17 23 18   Temp: 97.9 F (36.6 C) 98.8 F (37.1 C)  98 F (36.7 C)  TempSrc: Oral Oral  Oral  SpO2: 97% 97% 95% 97%  Weight:  97.1 kg (214 lb)  100 kg (220 lb 7.4 oz)  Height:  5\' 7"  (1.702 m)  5\' 7"   (1.702 m)   Eyes: PERRL, lids and conjunctivae normal ENMT: Mucous membranes are moist. Posterior pharynx clear of any exudate or lesions.Normal dentition.  Neck: normal, supple, no masses, no thyromegaly Respiratory: mildly diminished breadth sounds at the bases, otherwise clear to auscultation bilaterally, no wheezing, no crackles. Normal respiratory effort. No accessory muscle use.  Cardiovascular: Regular rate and rhythm, no murmurs / rubs / gallops. No extremity edema. 2+ pedal pulses. No carotid bruits.  Abdomen:  no tenderness, no masses palpated. No hepatosplenomegaly. Bowel sounds positive.  Musculoskeletal: no clubbing / cyanosis. No joint deformity upper and lower extremities. Good ROM, no contractures. Normal muscle tone.  Skin: no rashes, lesions, ulcers. No induration Neurologic: CN 2-12 grossly intact. Sensation intact, DTR normal. Strength 5/5 in all 4.  Psychiatric: Normal judgment and insight. Alert and oriented x 3. Normal mood.     Labs on Admission: I have personally reviewed following labs and imaging studies  CBC:  Recent Labs Lab 03/29/16 2039  WBC 10.1  NEUTROABS 8.3*  HGB 13.3  HCT 39.8  MCV 92.3  PLT [id number]   Basic Metabolic Panel:  Recent Labs Lab 03/29/16 2039  NA 135  K 2.7*  CL 88*  CO2 27  GLUCOSE 138*  BUN 17  CREATININE 1.10*  CALCIUM 7.9*  MG 1.1*   GFR: Estimated Creatinine Clearance: 65.3 mL/min (by C-G formula based on SCr of 1.1 mg/dL). Liver Function Tests:  Recent Labs Lab 03/29/16 2039  AST 42*  ALT 36  ALKPHOS 81  BILITOT 1.2  PROT 7.4  ALBUMIN 4.3   No results for input(s): LIPASE, AMYLASE in the last 168 hours. No results for input(s): AMMONIA in the last 168 hours. Coagulation Profile: No results for input(s): INR, PROTIME in the last 168 hours. Cardiac Enzymes: No results for input(s): CKTOTAL, CKMB, CKMBINDEX, TROPONINI in the last 168 hours. BNP (last 3 results) No results for input(s): PROBNP in the last  8760 hours. HbA1C: No results for input(s): HGBA1C in the last 72 hours. CBG: No results for input(s): GLUCAP in the last 168 hours. Lipid Profile: No results for input(s): CHOL, HDL, LDLCALC, TRIG, CHOLHDL, LDLDIRECT in the last 72 hours. Thyroid Function Tests: No results for input(s): TSH, T4TOTAL, FREET4, T3FREE, THYROIDAB in the last 72 hours. Anemia Panel: No results for input(s): VITAMINB12, FOLATE, FERRITIN, TIBC, IRON, RETICCTPCT in the last 72 hours. Urine analysis:    Component Value Date/Time   COLORURINE YELLOW 03/29/2016 2205   APPEARANCEUR CLOUDY (A) 03/29/2016 2205   LABSPEC 1.019 03/29/2016 2205   LABSPEC 1.005 08/19/2015 1432   PHURINE 5.5 03/29/2016 2205   GLUCOSEU NEGATIVE 03/29/2016 2205   GLUCOSEU Negative 08/19/2015 1432   HGBUR NEGATIVE 03/29/2016 2205   BILIRUBINUR SMALL (A) 03/29/2016 2205   BILIRUBINUR Negative 08/19/2015 1432   KETONESUR 15 (A) 03/29/2016 2205   PROTEINUR NEGATIVE 03/29/2016 2205   UROBILINOGEN 0.2 08/19/2015 1432   NITRITE NEGATIVE 03/29/2016 2205   LEUKOCYTESUR TRACE (A) 03/29/2016 2205   LEUKOCYTESUR Negative 08/19/2015 1432   Sepsis Labs: @LABRCNTIP (procalcitonin:4,lacticidven:4) )No results found for this or any previous visit (from the past 240 hour(s)).   Radiological Exams on Admission: Dg Chest 2 View  Result Date: 03/29/2016 CLINICAL DATA:  Shortness of breath.  Nausea and vomiting. EXAM: CHEST  2 VIEW COMPARISON:  Chest and right rib radiographs 5 days prior 03/24/2016 FINDINGS: Increasing subsegmental atelectasis at the right lung base. Stable left basilar atelectasis or scarring. Right rib fracture on recent rib series is not well seen. No confluent airspace disease, pleural effusion or pneumothorax. Broad-based dextroscoliotic curvature of the thoracic spine. IMPRESSION: Increasing subsegmental right basilar atelectasis. Right rib fracture on recent rib series is not seen. No pneumothorax. Electronically Signed   By:  Jeb Levering M.D.   On: 03/29/2016 21:36   EKG: Independently reviewed. No acute ischemic changes  Assessment/Plan Principal Problem:   Hypokalemia Active Problems:   Hypomagnesemia   Asthma   Paresthesia  Hypocalcemia  Plan  is to replete electrolytes with target Potassium of around 4.0 with patient on digoxin, with careful monitoring of pulmonary status/Pulmonary toileting as needed with worsening atelectasis and history of asthma. Check ionised calcium and consider intact PTH as needed. Her neuropathic symptoms were already improving at time admission and may be related to electrolyte imbalance - no evidence of CVA.  Trace LE per UA, no clinical evidence of UTI at this time - monitor.   DVT prophylaxis: Lovenox Code Status: Full Code Family Communication: N/A  Disposition Plan: Home in 2-3 days Consults called: N/A Admission status: inpatient obs / tele  OSEI-BONSU,Stormy Sabol MD [facility] Pager [phone]  If 7PM-7AM, please contact night-coverage [email or web address] Password [facility]  03/30/2016, 3:27 AM

## 2016-03-30 NOTE — Progress Notes (Signed)
[facility] PROGRESS NOTE    Progress Note  Tara [name]  [id number] DOB: March 06, 1956 DOA: 03/29/2016 PCP: [name], MD     Brief Narrative:   Tara [name] is an 60 y.o. female completed a past medical history bronchial asthma dysrhythmia on digoxin  Assessment/Plan:   Paresthesia due to Hypokalemia Continue to replete Potassium orally, potassium is slowly rising, we'll continue to replete. Recheck in the morning. Goal should be 4.0. Her spasms that she had, her saturations have remained greater than 90%. Out of bed to chair  Asthma: Stable.  Hypomagnesemia Likely due to diarrhea allergy 2.0.  Hypothyroidism: Continue Synthroid.  DVT prophylaxis: lovenox Family Communication:Husband Disposition Plan/Barrier to D/C: home in am Code Status:     Code Status Orders        Start     Ordered   03/30/16 0319  Full code  Continuous     03/30/16 0327    Code Status History    Date Active Date Inactive Code Status Order ID Comments User Context   This patient has a current code status but no historical code status.        IV Access:    Peripheral IV   Procedures and diagnostic studies:   Dg Chest 2 View  Result Date: 03/29/2016 CLINICAL DATA:  Shortness of breath.  Nausea and vomiting. EXAM: CHEST  2 VIEW COMPARISON:  Chest and right rib radiographs 5 days prior 03/24/2016 FINDINGS: Increasing subsegmental atelectasis at the right lung base. Stable left basilar atelectasis or scarring. Right rib fracture on recent rib series is not well seen. No confluent airspace disease, pleural effusion or pneumothorax. Broad-based dextroscoliotic curvature of the thoracic spine. IMPRESSION: Increasing subsegmental right basilar atelectasis. Right rib fracture on recent rib series is not seen. No pneumothorax. Electronically Signed   By: Jeb Levering M.D.   On: 03/29/2016 21:36    Medical Consultants:    None.  Anti-Infectives:   none  Subjective:      Tara [name] she relates her paresthesias completely resolve she feels back to baseline.  Objective:    Vitals:   03/29/16 2020 03/29/16 2128 03/29/16 2329 03/30/16 0539  BP: 144/73 126/67 120/64 (!) 110/50  Pulse: 82 72 74 65  Resp: 17 23 18 18   Temp: 98.8 F (37.1 C)  98 F (36.7 C) 97.7 F (36.5 C)  TempSrc: Oral  Oral Oral  SpO2: 97% 95% 97% 97%  Weight: 97.1 kg (214 lb)  100 kg (220 lb 7.4 oz)   Height: 5\' 7"  (1.702 m)  5\' 7"  (1.702 m)     Intake/Output Summary (Last 24 hours) at 03/30/16 0827 Last data filed at 03/30/16 0700  Gross per 24 hour  Intake          1898.33 ml  Output             3500 ml  Net         -1601.67 ml   Filed Weights   03/29/16 2020 03/29/16 2329  Weight: 97.1 kg (214 lb) 100 kg (220 lb 7.4 oz)    Exam: General exam: In no acute distress. Respiratory system: Good air movement and clear to auscultation. Cardiovascular system: S1 & S2 heard, RRR. Gastrointestinal system: Abdomen is nondistended, soft and nontender.  Central nervous system: Alert and oriented. No focal neurological deficits. Extremities: No pedal edema. Skin: No rashes, lesions or ulcers Psychiatry: Judgement and insight appear normal. Mood & affect appropriate.    Data Reviewed:  Labs: Basic Metabolic Panel:  Recent Labs Lab 03/29/16 2039 03/30/16 0455  NA 135 138  K 2.7* 3.2*  CL 88* 96*  CO2 27 33*  GLUCOSE 138* 139*  BUN 17 14  CREATININE 1.10* 0.83  CALCIUM 7.9* 7.5*  MG 1.1* 2.1   GFR Estimated Creatinine Clearance: 86.5 mL/min (by C-G formula based on SCr of 0.83 mg/dL). Liver Function Tests:  Recent Labs Lab 03/29/16 2039  AST 42*  ALT 36  ALKPHOS 81  BILITOT 1.2  PROT 7.4  ALBUMIN 4.3   No results for input(s): LIPASE, AMYLASE in the last 168 hours. No results for input(s): AMMONIA in the last 168 hours. Coagulation profile No results for input(s): INR, PROTIME in the last 168 hours.  CBC:  Recent Labs Lab 03/29/16 2039  03/30/16 0455  WBC 10.1 8.1  NEUTROABS 8.3*  --   HGB 13.3 12.1  HCT 39.8 36.3  MCV 92.3 92.4  PLT 161 167   Cardiac Enzymes: No results for input(s): CKTOTAL, CKMB, CKMBINDEX, TROPONINI in the last 168 hours. BNP (last 3 results) No results for input(s): PROBNP in the last 8760 hours. CBG: No results for input(s): GLUCAP in the last 168 hours. D-Dimer: No results for input(s): DDIMER in the last 72 hours. Hgb A1c: No results for input(s): HGBA1C in the last 72 hours. Lipid Profile: No results for input(s): CHOL, HDL, LDLCALC, TRIG, CHOLHDL, LDLDIRECT in the last 72 hours. Thyroid function studies: No results for input(s): TSH, T4TOTAL, T3FREE, THYROIDAB in the last 72 hours.  Invalid input(s): FREET3 Anemia work up: No results for input(s): VITAMINB12, FOLATE, FERRITIN, TIBC, IRON, RETICCTPCT in the last 72 hours. Sepsis Labs:  Recent Labs Lab 03/29/16 2039 03/30/16 0455  WBC 10.1 8.1   Microbiology No results found for this or any previous visit (from the past 240 hour(s)).   Medications:   . budesonide  0.5 mg Nebulization BID  . citalopram  10 mg Oral QPM  . digoxin  0.25 mg Oral Daily  . enoxaparin (LOVENOX) injection  50 mg Subcutaneous Q24H  . letrozole  2.5 mg Oral Daily  . levothyroxine  88 mcg Oral QAC breakfast  . montelukast  10 mg Oral Daily  . multivitamin with minerals  1 tablet Oral Daily  . vitamin B-12  2,000 mcg Oral Daily   Continuous Infusions: . dextrose 5 % and 0.9 % NaCl with KCl 40 mEq/L 100 mL/hr (03/30/16 0301)    Time spent: 25 min   LOS: 0 days   Charlynne Cousins  [facility] Pager [phone]  *Please refer to [email or web address], password [email or web address] to get updated schedule on who will round on this patient, as hospitalists switch teams weekly. If 7PM-7AM, please contact night-coverage at [email or web address], password [email or web address] for any overnight needs.  03/30/2016, 8:27 AM

## 2016-03-31 DIAGNOSIS — R202 Paresthesia of skin: Secondary | ICD-10-CM | POA: Diagnosis not present

## 2016-03-31 DIAGNOSIS — E876 Hypokalemia: Secondary | ICD-10-CM | POA: Diagnosis not present

## 2016-03-31 LAB — CALCIUM, IONIZED: Calcium, Ionized, Serum: 4.2 mg/dL — ABNORMAL LOW (ref 4.5–5.6)

## 2016-03-31 LAB — BASIC METABOLIC PANEL
Anion gap: 7 (ref 5–15)
BUN: 9 mg/dL (ref 6–20)
CO2: 30 mmol/L (ref 22–32)
Calcium: 7.8 mg/dL — ABNORMAL LOW (ref 8.9–10.3)
Chloride: 105 mmol/L (ref 101–111)
Creatinine, Ser: 0.59 mg/dL (ref 0.44–1.00)
GFR calc Af Amer: >60 mL/min (ref >60)
GFR calc non Af Amer: >60 mL/min (ref >60)
Glucose, Bld: 126 mg/dL — ABNORMAL HIGH (ref 65–99)
Potassium: 4.7 mmol/L (ref 3.5–5.1)
Sodium: 142 mmol/L (ref 135–145)

## 2016-03-31 NOTE — Discharge Summary (Signed)
Physician Discharge Summary  Tara [name] [id number] DOB: 01-25-1956 DOA: 03/29/2016  PCP: [name], MD  Admit date: 03/29/2016 Discharge date: 03/31/2016  Admitted From: Home Disposition:  Home  Recommendations for Outpatient Follow-up:  1. Follow up with PCP in 1-2 month 2. Please obtain BMP, follow Potassium. 3. Please follow up on the following pending results:  Home Health:No Equipment/Devices:No  Discharge Condition:stable CODE STATUS:full Diet recommendation: Heart Healthy  Brief/Interim Summary: 60 year old female with past medical history of bronchial asthma dysrhythmia on digitoxin that comes in with paresthesias around her mouth and fingertips was found to be severely hypokalemic.  Discharge Diagnoses:  Paresthesias due to the hypokalemia: This episode of hypokalemia was likely due to her recent nor virus infection for which she was having more than 10 bowel movements a day. Her hypokalemia resolved with oral repletion of potassium. She'll follow-up with her PCP in one month and check a basic metabolic panel at that time.  Asthma: Stable.  Hypomagnesemia: Likely due to diarrhea is resolved with repletion.  Hypothyroidism: No changes made to her medication.   Discharge Instructions  Discharge Instructions    Diet - low sodium heart healthy    Complete by:  As directed   Increase activity slowly    Complete by:  As directed       Medication List    TAKE these medications   acetaminophen 500 MG tablet Commonly known as:  TYLENOL Take 500 mg by mouth every 6 (six) hours as needed for mild pain, moderate pain or headache.   albuterol 108 (90 Base) MCG/ACT inhaler Commonly known as:  PROVENTIL HFA;VENTOLIN HFA Inhale 2 puffs into the lungs every 6 (six) hours as needed for wheezing or shortness of breath.   budesonide 180 MCG/ACT inhaler Commonly known as:  PULMICORT Inhale 1 puff into the lungs daily.   cholecalciferol 1000 units  tablet Commonly known as:  VITAMIN D Take 3,000 Units by mouth daily.   citalopram 10 MG tablet Commonly known as:  CELEXA Take 10 mg by mouth every evening.   digoxin 0.25 MG tablet Commonly known as:  LANOXIN Take 1 tablet (0.25 mg total) by mouth daily.   fluticasone 50 MCG/ACT nasal spray Commonly known as:  FLONASE Place 2 sprays into both nostrils daily as needed for rhinitis.   glucosamine-chondroitin 500-400 MG tablet Take 1 tablet by mouth daily.   letrozole 2.5 MG tablet Commonly known as:  FEMARA Take 1 tablet (2.5 mg total) by mouth daily.   levothyroxine 88 MCG tablet Commonly known as:  SYNTHROID, LEVOTHROID Take 88 mcg by mouth daily before breakfast.   montelukast 10 MG tablet Commonly known as:  SINGULAIR Take 10 mg by mouth daily.   multivitamin with minerals Tabs tablet Take 1 tablet by mouth daily.   ondansetron 4 MG tablet Commonly known as:  ZOFRAN Take 1 tablet (4 mg total) by mouth every 8 (eight) hours as needed for nausea or vomiting.   potassium chloride SA 20 MEQ tablet Commonly known as:  K-DUR,KLOR-CON Take 20 mEq by mouth daily.   torsemide 20 MG tablet Commonly known as:  DEMADEX Take 20 mg by mouth daily.   vitamin B-12 1000 MCG tablet Commonly known as:  CYANOCOBALAMIN Take 2,000 mcg by mouth daily.       Allergies  Allergen Reactions  . Aspirin Other (See Comments)    Pt has a hx of TTP.   . Nsaids Other (See Comments)    Pt has a hx of TTP.  Consultations:  None   Procedures/Studies: Dg Chest 2 View  Result Date: 03/29/2016 CLINICAL DATA:  Shortness of breath.  Nausea and vomiting. EXAM: CHEST  2 VIEW COMPARISON:  Chest and right rib radiographs 5 days prior 03/24/2016 FINDINGS: Increasing subsegmental atelectasis at the right lung base. Stable left basilar atelectasis or scarring. Right rib fracture on recent rib series is not well seen. No confluent airspace disease, pleural effusion or pneumothorax.  Broad-based dextroscoliotic curvature of the thoracic spine. IMPRESSION: Increasing subsegmental right basilar atelectasis. Right rib fracture on recent rib series is not seen. No pneumothorax. Electronically Signed   By: Jeb Levering M.D.   On: 03/29/2016 21:36  Dg Ribs Unilateral W/chest Right  Result Date: 03/24/2016 CLINICAL DATA:  Fall. EXAM: RIGHT RIBS AND CHEST - 3+ VIEW COMPARISON:  10/16/2015. FINDINGS: Nondisplaced right posterior lateral eighth rib fracture is present. No pneumothorax. Thoracolumbar spine scoliosis . IMPRESSION: Right posterior-lateral slightly displaced right eighth rib fracture. No pneumothorax. Electronically Signed   By: Marcello Moores  Register   On: 03/24/2016 13:23   (Echo, Carotid, EGD, Colonoscopy, ERCP)    Subjective:   Discharge Exam: Vitals:   03/30/16 2207 03/31/16 0548  BP: 124/75 121/63  Pulse: 65 (!) 55  Resp: 20 20  Temp: 98.4 F (36.9 C) 97.7 F (36.5 C)   Vitals:   03/30/16 1956 03/30/16 2207 03/31/16 0548 03/31/16 0843  BP:  124/75 121/63   Pulse:  65 (!) 55   Resp:  20 20   Temp:  98.4 F (36.9 C) 97.7 F (36.5 C)   TempSrc:  Oral Oral   SpO2: 97% 98% 97% 98%  Weight:      Height:        General: Pt is alert, awake, not in acute distress Cardiovascular: RRR, S1/S2 +, no rubs, no gallops Respiratory: CTA bilaterally, no wheezing, no rhonchi Abdominal: Soft, NT, ND, bowel sounds + Extremities: no edema, no cyanosis    The results of significant diagnostics from this hospitalization (including imaging, microbiology, ancillary and laboratory) are listed below for reference.     Microbiology: No results found for this or any previous visit (from the past 240 hour(s)).   Labs: BNP (last 3 results) No results for input(s): BNP in the last 8760 hours. Basic Metabolic Panel:  Recent Labs Lab 03/29/16 2039 03/30/16 0455  NA 135 138  K 2.7* 3.2*  CL 88* 96*  CO2 27 33*  GLUCOSE 138* 139*  BUN 17 14  CREATININE 1.10*  0.83  CALCIUM 7.9* 7.5*  MG 1.1* 2.1   Liver Function Tests:  Recent Labs Lab 03/29/16 2039  AST 42*  ALT 36  ALKPHOS 81  BILITOT 1.2  PROT 7.4  ALBUMIN 4.3   No results for input(s): LIPASE, AMYLASE in the last 168 hours. No results for input(s): AMMONIA in the last 168 hours. CBC:  Recent Labs Lab 03/29/16 2039 03/30/16 0455  WBC 10.1 8.1  NEUTROABS 8.3*  --   HGB 13.3 12.1  HCT 39.8 36.3  MCV 92.3 92.4  PLT 161 167   Cardiac Enzymes: No results for input(s): CKTOTAL, CKMB, CKMBINDEX, TROPONINI in the last 168 hours. BNP: Invalid input(s): POCBNP CBG: No results for input(s): GLUCAP in the last 168 hours. D-Dimer No results for input(s): DDIMER in the last 72 hours. Hgb A1c No results for input(s): HGBA1C in the last 72 hours. Lipid Profile No results for input(s): CHOL, HDL, LDLCALC, TRIG, CHOLHDL, LDLDIRECT in the last 72 hours. Thyroid function studies No  results for input(s): TSH, T4TOTAL, T3FREE, THYROIDAB in the last 72 hours.  Invalid input(s): FREET3 Anemia work up No results for input(s): VITAMINB12, FOLATE, FERRITIN, TIBC, IRON, RETICCTPCT in the last 72 hours. Urinalysis    Component Value Date/Time   COLORURINE YELLOW 03/29/2016 2205   APPEARANCEUR CLOUDY (A) 03/29/2016 2205   LABSPEC 1.019 03/29/2016 2205   LABSPEC 1.005 08/19/2015 1432   PHURINE 5.5 03/29/2016 2205   GLUCOSEU NEGATIVE 03/29/2016 2205   GLUCOSEU Negative 08/19/2015 1432   HGBUR NEGATIVE 03/29/2016 2205   BILIRUBINUR SMALL (A) 03/29/2016 2205   BILIRUBINUR Negative 08/19/2015 1432   KETONESUR 15 (A) 03/29/2016 2205   PROTEINUR NEGATIVE 03/29/2016 2205   UROBILINOGEN 0.2 08/19/2015 1432   NITRITE NEGATIVE 03/29/2016 2205   LEUKOCYTESUR TRACE (A) 03/29/2016 2205   LEUKOCYTESUR Negative 08/19/2015 1432   Sepsis Labs Invalid input(s): PROCALCITONIN,  WBC,  LACTICIDVEN Microbiology No results found for this or any previous visit (from the past 240 hour(s)).   Time  coordinating discharge: Over 30 minutes  SIGNED:   Charlynne Cousins, MD  [facility] 03/31/2016, 9:55 AM Pager   If 7PM-7AM, please contact night-coverage [email or web address] Password [email or web address]

## 2016-04-10 ENCOUNTER — Encounter: Payer: Self-pay | Admitting: Internal Medicine

## 2016-04-10 ENCOUNTER — Ambulatory Visit (INDEPENDENT_AMBULATORY_CARE_PROVIDER_SITE_OTHER): Payer: Managed Care, Other (non HMO) | Admitting: Internal Medicine

## 2016-04-10 VITALS — BP 106/70 | HR 72 | Temp 98.3°F | Ht 67.0 in | Wt 219.5 lb

## 2016-04-10 DIAGNOSIS — F418 Other specified anxiety disorders: Secondary | ICD-10-CM | POA: Diagnosis not present

## 2016-04-10 DIAGNOSIS — Z853 Personal history of malignant neoplasm of breast: Secondary | ICD-10-CM

## 2016-04-10 DIAGNOSIS — Z8619 Personal history of other infectious and parasitic diseases: Secondary | ICD-10-CM

## 2016-04-10 DIAGNOSIS — F329 Major depressive disorder, single episode, unspecified: Secondary | ICD-10-CM

## 2016-04-10 DIAGNOSIS — E039 Hypothyroidism, unspecified: Secondary | ICD-10-CM

## 2016-04-10 DIAGNOSIS — E876 Hypokalemia: Secondary | ICD-10-CM

## 2016-04-10 DIAGNOSIS — S2231XD Fracture of one rib, right side, subsequent encounter for fracture with routine healing: Secondary | ICD-10-CM

## 2016-04-10 DIAGNOSIS — F32A Depression, unspecified: Secondary | ICD-10-CM

## 2016-04-10 DIAGNOSIS — F419 Anxiety disorder, unspecified: Secondary | ICD-10-CM

## 2016-04-10 LAB — BASIC METABOLIC PANEL
BUN: 14 mg/dL (ref 7–25)
CO2: 30 mmol/L (ref 20–31)
Calcium: 8.6 mg/dL (ref 8.6–10.4)
Chloride: 102 mmol/L (ref 98–110)
Creat: 0.79 mg/dL (ref 0.50–0.99)
Glucose, Bld: 99 mg/dL (ref 65–99)
Potassium: 4 mmol/L (ref 3.5–5.3)
Sodium: 142 mmol/L (ref 135–146)

## 2016-04-10 LAB — MAGNESIUM: Magnesium: 1.7 mg/dL (ref 1.5–2.5)

## 2016-04-18 ENCOUNTER — Other Ambulatory Visit: Payer: Self-pay | Admitting: Internal Medicine

## 2016-04-26 ENCOUNTER — Encounter: Payer: Self-pay | Admitting: Internal Medicine

## 2016-04-26 NOTE — Patient Instructions (Signed)
You should not need extra potassium or magnesium supplements at this point in time as depletion was due to viral gastroenteritis. Rib will heal in several weeks and needs no further treatment at this point in time. Keep appointment in November for physical exam

## 2016-04-26 NOTE — Progress Notes (Signed)
   Subjective:    Patient ID: Tara [name], female    DOB: 01/01/1956, 60 y.o.   MRN: [id number]  HPI She is here today for hospital follow-up after being admitted July 30 through August 1 for paresthesias about her mouth and fingertips. She was found to be hypokalemic likely due to recent bout of apparent Norovirus/gastroenteritis at which time she was having some 10 bowel movements daily. She was also found to have hypomagnesemia likely due to diarrhea. This resolved with repletion of magnesium. Potassium resolved with oral repletion.  She has history of anxiety and obsessive about her health a lot.  She also had fractured rib diagnosed on July 25 after falling at her home some 6 nights previously. She apparently injured her right elbow and sprain to fingers on her right hand at the time. She did not strike her head or lose consciousness. She was also suffering from gastroenteritis at that time. She had an eighth rib fracture on the right. Was treated with hydrocodone/APAP.  Now feels well without any new complaints.  She has a remote history of breast cancer diagnosed in [place] in 2012. She had left lower pneumonia in February 2017. She has a history of hypothyroidism and impaired glucose tolerance. She has morbid obesity. She is status post bilateral hip replacements in 2005 in 2007. She has a remote history of TTP and is always afraid that it will come back. She says it was diagnosed at age 2 and she had a recurrence at age 53 while being treated with interferon for hepatitis C. History of hepatitis C. History of depression treated with Celexa. History of dependent edema.  Dr. Jana Hakim follows her for history of breast cancer.  She does not smoke. Drinks a glass of wine twice weekly.  Family history: Mother with history of skin cancer. Father died of COPD complications.    Review of Systems no new complaints     Objective:   Physical Exam Neck is supple without JVD thyromegaly or  carotid bruits. Chest clear to auscultation. Cardiac exam regular rate and rhythm normal S1 and S2. Extremities without edema.       Assessment & Plan:  Hypokalemia-resolved  Hypomagnesemia-resolved  History of breast cancer  Anxiety  Remote history of TTP  Fractured right eighth rib secondary to fall  Plan: Patient reassured that imbalances have been corrected and rib will heal. She has appointment for annual physical exam in November

## 2016-05-14 ENCOUNTER — Telehealth: Payer: Self-pay | Admitting: Internal Medicine

## 2016-05-14 NOTE — Telephone Encounter (Signed)
Spoke with Dr. Redmond Pulling about my concerns with pt's anxiety about her health. She concurs.

## 2016-05-14 NOTE — Telephone Encounter (Signed)
Dr. Doroteo Glassman is calling after seeing the patient.  She is stating that she has seen your patient.    She states that the patient states that she has "Flash backs from PTP that she had been treated for years before.  She had trouble trying to understand what the concerns were.  She has a lot of health anxiety and a lot of health issues.  She's not sure if she hasn't seen her enough or what exactly your concerns were for seeing that patient.  She wants to speak with you to get a little more information on the patient and to see exactly what she needs to focus in on specifically.    She just seems to be quite perplexed on how to help her after seeing her.    Office [phone] Cell [phone] (usually turns this off.  If you're calling after 5, this is the better #)

## 2016-06-29 ENCOUNTER — Other Ambulatory Visit: Payer: Managed Care, Other (non HMO) | Admitting: Internal Medicine

## 2016-06-29 ENCOUNTER — Other Ambulatory Visit: Payer: Self-pay | Admitting: Internal Medicine

## 2016-06-29 DIAGNOSIS — Z923 Personal history of irradiation: Secondary | ICD-10-CM

## 2016-06-29 DIAGNOSIS — E039 Hypothyroidism, unspecified: Secondary | ICD-10-CM

## 2016-06-29 LAB — LIPID PANEL
Cholesterol: 173 mg/dL (ref 125–200)
HDL: 41 mg/dL — ABNORMAL LOW (ref >=46)
LDL Cholesterol: 103 mg/dL (ref <130)
Total CHOL/HDL Ratio: 4.2 Ratio (ref <=5.0)
Triglycerides: 146 mg/dL (ref <150)
VLDL: 29 mg/dL (ref <30)

## 2016-06-29 LAB — CBC WITH DIFFERENTIAL/PLATELET
Basophils Absolute: 57 cells/uL (ref 0–200)
Basophils Relative: 1 %
Eosinophils Absolute: 285 cells/uL (ref 15–500)
Eosinophils Relative: 5 %
HCT: 39.4 % (ref 35.0–45.0)
Hemoglobin: 12.8 g/dL (ref 11.7–15.5)
Lymphocytes Relative: 16 %
Lymphs Abs: 912 cells/uL (ref 850–3900)
MCH: 30.8 pg (ref 27.0–33.0)
MCHC: 32.5 g/dL (ref 32.0–36.0)
MCV: 94.9 fL (ref 80.0–100.0)
MPV: 11.6 fL (ref 7.5–12.5)
Monocytes Absolute: 570 cells/uL (ref 200–950)
Monocytes Relative: 10 %
Neutro Abs: 3876 cells/uL (ref 1500–7800)
Neutrophils Relative %: 68 %
Platelets: 160 10*3/uL (ref 140–400)
RBC: 4.15 MIL/uL (ref 3.80–5.10)
RDW: 14.4 % (ref 11.0–15.0)
WBC: 5.7 10*3/uL (ref 3.8–10.8)

## 2016-06-29 LAB — COMPREHENSIVE METABOLIC PANEL
ALT: 22 U/L (ref 6–29)
AST: 22 U/L (ref 10–35)
Albumin: 4 g/dL (ref 3.6–5.1)
Alkaline Phosphatase: 59 U/L (ref 33–130)
BUN: 13 mg/dL (ref 7–25)
CO2: 30 mmol/L (ref 20–31)
Calcium: 8.7 mg/dL (ref 8.6–10.4)
Chloride: 101 mmol/L (ref 98–110)
Creat: 0.81 mg/dL (ref 0.50–0.99)
Glucose, Bld: 114 mg/dL — ABNORMAL HIGH (ref 65–99)
Potassium: 4.2 mmol/L (ref 3.5–5.3)
Sodium: 139 mmol/L (ref 135–146)
Total Bilirubin: 0.5 mg/dL (ref 0.2–1.2)
Total Protein: 6.7 g/dL (ref 6.1–8.1)

## 2016-06-29 LAB — TSH: TSH: 1.39 mIU/L

## 2016-06-30 LAB — VITAMIN D 25 HYDROXY (VIT D DEFICIENCY, FRACTURES): Vit D, 25-Hydroxy: 35 ng/mL (ref 30–100)

## 2016-07-02 ENCOUNTER — Ambulatory Visit (INDEPENDENT_AMBULATORY_CARE_PROVIDER_SITE_OTHER): Payer: Managed Care, Other (non HMO) | Admitting: Internal Medicine

## 2016-07-02 ENCOUNTER — Other Ambulatory Visit (HOSPITAL_COMMUNITY)
Admission: RE | Admit: 2016-07-02 | Discharge: 2016-07-02 | Disposition: A | Discharge status: Home / Self Care | Payer: Managed Care, Other (non HMO) | Source: Ambulatory Visit | Attending: Internal Medicine | Admitting: Internal Medicine

## 2016-07-02 ENCOUNTER — Encounter: Payer: Self-pay | Admitting: Internal Medicine

## 2016-07-02 VITALS — BP 110/70 | HR 79 | Ht 67.0 in | Wt 224.0 lb

## 2016-07-02 DIAGNOSIS — R7302 Impaired glucose tolerance (oral): Secondary | ICD-10-CM

## 2016-07-02 DIAGNOSIS — Z8709 Personal history of other diseases of the respiratory system: Secondary | ICD-10-CM

## 2016-07-02 DIAGNOSIS — R609 Edema, unspecified: Secondary | ICD-10-CM

## 2016-07-02 DIAGNOSIS — Z853 Personal history of malignant neoplasm of breast: Secondary | ICD-10-CM | POA: Diagnosis not present

## 2016-07-02 DIAGNOSIS — F419 Anxiety disorder, unspecified: Secondary | ICD-10-CM

## 2016-07-02 DIAGNOSIS — Z23 Encounter for immunization: Secondary | ICD-10-CM

## 2016-07-02 DIAGNOSIS — F418 Other specified anxiety disorders: Secondary | ICD-10-CM | POA: Diagnosis not present

## 2016-07-02 DIAGNOSIS — F32A Depression, unspecified: Secondary | ICD-10-CM

## 2016-07-02 DIAGNOSIS — R296 Repeated falls: Secondary | ICD-10-CM | POA: Diagnosis not present

## 2016-07-02 DIAGNOSIS — Z862 Personal history of diseases of the blood and blood-forming organs and certain disorders involving the immune mechanism: Secondary | ICD-10-CM

## 2016-07-02 DIAGNOSIS — F329 Major depressive disorder, single episode, unspecified: Secondary | ICD-10-CM

## 2016-07-02 DIAGNOSIS — Z8619 Personal history of other infectious and parasitic diseases: Secondary | ICD-10-CM | POA: Diagnosis not present

## 2016-07-02 DIAGNOSIS — Z01419 Encounter for gynecological examination (general) (routine) without abnormal findings: Secondary | ICD-10-CM | POA: Diagnosis present

## 2016-07-02 DIAGNOSIS — Z0000 Encounter for general adult medical examination without abnormal findings: Secondary | ICD-10-CM

## 2016-07-02 DIAGNOSIS — Z96643 Presence of artificial hip joint, bilateral: Secondary | ICD-10-CM

## 2016-07-02 LAB — HEMOGLOBIN A1C
Hgb A1c MFr Bld: 5.6 % (ref <5.7)
Mean Plasma Glucose: 114 mg/dL

## 2016-07-02 NOTE — Progress Notes (Signed)
Subjective:    Patient ID: Tara [name], female    DOB: September 17, 1955, 60 y.o.   MRN: [id number]  HPI  60 year old Female for health maintenance exam and evaluation of medical issues.She has a history of obesity, hypothyroidism, allergic rhinitis, asthma, status post bilateral hip replacements: Right 2005 and left 2007. History of TTP. History of hepatitis C. History of depression treated with Celexa. History of dependent edema. History of eczema left foot.  With regard to TTP, she was 60 years of age when she had it. She did not have a splenectomy. She had recurrence at age 7 while being treated with interferon for hepatitis C.  History of left arm lymphedema. History of fractured left elbow with pins placed. Left foot reconstruction 2008.  Dr. Jana Hakim referred her to me in 2013 for primary care. She found a mass in her left breast while residing in [place] in 2012. Mammogram showed suspicious abnormality in the left breast measuring about 10 mm and physical exam showed a 1 cm firm superficial mass in the axillary region of the breast. Biopsy performed October 2012 at [facility]. She had invasive ductal carcinoma grade 3, estrogen receptor positive, progesterone receptor positive. Tumor was first 2 positive with a ratio of 2.5. Subsequently had definitive left lumpectomy and sentinel node sampling December 2012. 2 sentinel nodes were negative for tumor. Decision was made to proceed with 4 cycles of adjuvant chemotherapy. Chemotherapy was started in [place] in 2013 after she moved here from [place]. She subsequently developed cellulitis in her left breast and was treated with Keflex.  She has done well with no significant peripheral neuropathy. She received radiation therapy here.  Social history: She does not smoke. 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 Husband is a Dance movement psychotherapist. She drinks a glass of wine twice  weekly.  History of impaired glucose tolerance. She has been referred to diabetes treatment Center for counseling. In February hemoglobin A1c was 6.2%. Last year was 6.2%. Now is 5.6%.  She was admitted to the hospital in July 2017 with hypokalemia thought to be secondary to diarrhea. She had gastroenteritis presumably viral with more than 10 bowel movements daily. She was given potassium repletion and improved. She subsequently had a fall at home when she was sick with gastroenteritis. She suffered a nondisplaced right posterior lateral eighth rib fracture. This has improved.  She had left lower lobe pneumonia January 2017 treated as an outpatient  In December 2016 she had a CT of the chest with contrast for complaint of back and chest pain for 18 months. Study showed no evidence of metastatic disease or acute findings.  Family history: Mother with history of skin cancer but patient is not sure what type. Father died of COPD complications. Older sister and younger brother healthy. No new changes in family history according to patient. No history of breast or ovarian cancer in the immediate family.        Review of Systems anxiety about her health     Objective:   Physical Exam  Constitutional: She is oriented to person, place, and time. She appears well-developed and well-nourished. No distress.  HENT:  Head: Normocephalic and atraumatic.  Right Ear: External ear normal.  Left Ear: External ear normal.  Mouth/Throat: Oropharynx is clear and moist. No oropharyngeal exudate.  Eyes: Conjunctivae and EOM are normal. Pupils are equal, round, and reactive to light. Right eye exhibits no discharge. Left eye  exhibits no discharge. No scleral icterus.  Neck: Neck supple. No thyromegaly present.  Cardiovascular: Normal rate, regular rhythm, normal heart sounds and intact distal pulses.   No murmur heard. Pulmonary/Chest: Effort normal and breath sounds normal. She has no wheezes. She has no  rales.  Abdominal: Soft. Bowel sounds are normal. She exhibits no distension and no mass. There is no tenderness. There is no rebound and no guarding.  Genitourinary:  Genitourinary Comments: Pap taken. No masses on bimanual exam  Musculoskeletal: She exhibits no edema.  Lymphadenopathy:    She has no cervical adenopathy.  Neurological: She is alert and oriented to person, place, and time. She has normal reflexes. No cranial nerve deficit. Coordination normal.  Skin: Skin is warm and dry. No rash noted. She is not diaphoretic.  Psychiatric: She has a normal mood and affect. Her behavior is normal. Judgment and thought content normal.  Vitals reviewed.         Assessment & Plan:  History of breast cancer-followed by oncology. Treated with Femara-is supposed to be on Femara for total of 5 years. This was started October 2013.  Impaired glucose tolerance-in [place] and A1c now normal  Obesity  Anxiety  Her motor history of hepatitis C treated with interferon  Remote history of TTP  History of hypokalemia  Hypothyroidism-TSH is normal on thyroid replacement  History of asthma  History of allergic rhinitis  Status post bilateral hip replacements  History of cardiac dysrhythmia treated with digoxin  Dependent edema  Plan: Return in 6 months or as needed. Continue to work on diet and exercise and weight loss efforts.

## 2016-07-06 LAB — CYTOLOGY - PAP: Diagnosis: NEGATIVE

## 2016-07-13 ENCOUNTER — Other Ambulatory Visit: Payer: Self-pay | Admitting: Internal Medicine

## 2016-07-25 NOTE — Patient Instructions (Signed)
It was a pleasure to see you today. Please continue to work on diet exercise and weight loss. Please return in 6 months for follow-up.

## 2016-08-13 ENCOUNTER — Other Ambulatory Visit (HOSPITAL_BASED_OUTPATIENT_CLINIC_OR_DEPARTMENT_OTHER): Payer: Managed Care, Other (non HMO)

## 2016-08-13 DIAGNOSIS — C50912 Malignant neoplasm of unspecified site of left female breast: Secondary | ICD-10-CM | POA: Diagnosis not present

## 2016-08-13 DIAGNOSIS — C50412 Malignant neoplasm of upper-outer quadrant of left female breast: Secondary | ICD-10-CM

## 2016-08-13 DIAGNOSIS — Z853 Personal history of malignant neoplasm of breast: Secondary | ICD-10-CM

## 2016-08-13 LAB — COMPREHENSIVE METABOLIC PANEL
ALT: 31 U/L (ref 0–55)
AST: 25 U/L (ref 5–34)
Albumin: 3.4 g/dL — ABNORMAL LOW (ref 3.5–5.0)
Alkaline Phosphatase: 72 U/L (ref 40–150)
Anion Gap: 11 mEq/L (ref 3–11)
BUN: 9.4 mg/dL (ref 7.0–26.0)
CO2: 28 mEq/L (ref 22–29)
Calcium: 8.4 mg/dL (ref 8.4–10.4)
Chloride: 102 mEq/L (ref 98–109)
Creatinine: 0.8 mg/dL (ref 0.6–1.1)
EGFR: 82 mL/min/{1.73_m2} — ABNORMAL LOW (ref >90)
Glucose: 108 mg/dl (ref 70–140)
Potassium: 3.9 mEq/L (ref 3.5–5.1)
Sodium: 141 mEq/L (ref 136–145)
Total Bilirubin: 0.59 mg/dL (ref 0.20–1.20)
Total Protein: 7.1 g/dL (ref 6.4–8.3)

## 2016-08-13 LAB — CBC WITH DIFFERENTIAL/PLATELET
BASO%: 0.3 % (ref 0.0–2.0)
Basophils Absolute: 0 10*3/uL (ref 0.0–0.1)
EOS%: 3 % (ref 0.0–7.0)
Eosinophils Absolute: 0.2 10*3/uL (ref 0.0–0.5)
HCT: 38.1 % (ref 34.8–46.6)
HGB: 12.5 g/dL (ref 11.6–15.9)
LYMPH%: 14.4 % (ref 14.0–49.7)
MCH: 30.8 pg (ref 25.1–34.0)
MCHC: 32.8 g/dL (ref 31.5–36.0)
MCV: 93.8 fL (ref 79.5–101.0)
MONO#: 0.6 10*3/uL (ref 0.1–0.9)
MONO%: 8.9 % (ref 0.0–14.0)
NEUT#: 4.7 10*3/uL (ref 1.5–6.5)
NEUT%: 73.4 % (ref 38.4–76.8)
Platelets: 144 10*3/uL — ABNORMAL LOW (ref 145–400)
RBC: 4.06 10*6/uL (ref 3.70–5.45)
RDW: 14.1 % (ref 11.2–14.5)
WBC: 6.4 10*3/uL (ref 3.9–10.3)
lymph#: 0.9 10*3/uL (ref 0.9–3.3)

## 2016-08-15 ENCOUNTER — Other Ambulatory Visit: Payer: Self-pay | Admitting: Oncology

## 2016-08-15 DIAGNOSIS — Z853 Personal history of malignant neoplasm of breast: Secondary | ICD-10-CM

## 2016-08-16 ENCOUNTER — Other Ambulatory Visit: Payer: Self-pay | Admitting: Internal Medicine

## 2016-08-18 ENCOUNTER — Ambulatory Visit (HOSPITAL_BASED_OUTPATIENT_CLINIC_OR_DEPARTMENT_OTHER): Payer: Managed Care, Other (non HMO) | Admitting: Oncology

## 2016-08-18 ENCOUNTER — Other Ambulatory Visit: Payer: Self-pay | Admitting: Oncology

## 2016-08-18 VITALS — BP 133/70 | HR 95 | Temp 98.1°F | Resp 17 | Ht 67.0 in | Wt 232.0 lb

## 2016-08-18 DIAGNOSIS — Z79811 Long term (current) use of aromatase inhibitors: Secondary | ICD-10-CM

## 2016-08-18 DIAGNOSIS — C50612 Malignant neoplasm of axillary tail of left female breast: Secondary | ICD-10-CM

## 2016-08-18 DIAGNOSIS — Z170 Estrogen receptor positive status [ER+]: Secondary | ICD-10-CM | POA: Diagnosis not present

## 2016-08-18 DIAGNOSIS — Z853 Personal history of malignant neoplasm of breast: Secondary | ICD-10-CM

## 2016-08-18 DIAGNOSIS — C50412 Malignant neoplasm of upper-outer quadrant of left female breast: Secondary | ICD-10-CM

## 2016-08-18 MED ORDER — LETROZOLE 2.5 MG PO TABS: 2.5000 mg | ORAL_TABLET | Freq: Every day | ORAL | 0 refills | Status: DC

## 2016-08-18 NOTE — Progress Notes (Signed)
ID: Tara [name]   DOB: 03/26/1956  MR#: [id number]  [id number]  PCP:  [name], MD GYN: SU:  Autumn Messing, MD Other:  Delfin Edis, MD;  Arloa Koh, MD;  Glori Bickers, MD  CHIEF COMPLAINT:    Estrogen receptor positive breast cancer   CURRENT TREATMENT: Letrozole   HISTORY OF PRESENT ILLNESS:  from the original intake note:  Tara [name] noted a mass in her left breast and brought it to her physician's attention in [place], where she was residing. She was set up for mammography 06/24/2011 at [facility] in [place], [place]. This showed a suspicious abnormality in the left breast measuring approximately 10 mm. Physical examination showed a 1 cm firm superficial mass in the axillary region of the breast and ultrasound showed this to be 11 mm and spiculated.  Biopsy was performed June 25, 2011, and showed ([id number] at the [facility]) and invasive ductal carcinoma, grade 3, estrogen receptor 95% positive, progesterone receptor 40% positive, with an MIB-1 of 29%. The tumor is HER 2 positive by FISH with a ratio of 2.5. There is tumor heterogeneity noted.  With this information the case was presented Jul 08, 2011 at the multidisciplinary  breast cancer clinic. She proceeded to definitive left lumpectomy and sentinel lymph node sampling 08/03/2011. This confirmed a 1.6 cm invasive ductal carcinoma, grade 3. Margins were ample. The two sentinel lymph nodes were clear.   Her subsequent history is as detailed below.  INTERVAL HISTORY: Tara [name] returns today for follow-up of her estrogen receptor positive breast cancer. She is on letrozole, with good tolerance. Hot flashes are not a problem. Vaginal dryness is a major issue but she has not wanted to use lubricants or participate in our intimacy and pelvic health class. She obtains a drug at a very good price.  REVIEW OF SYSTEMS: Tara [name] has had a hard year, with a diagnosis of pneumonia after a trip to [place] and then  a rotavirus infection that really knocked her down she says. She occasionally has leg cramps, she has ringing in her years, and she is always worried that she will have recurrent TTP, although she reports no bleeding or bruising symptoms. She is not exercising regularly but intends to do so she says. A detailed review of systems today was otherwise stable  PAST MEDICAL HISTORY: Past Medical History:  Diagnosis Date  . Anxiety   . Arthritis   . Asthma   . Blood transfusion   . Breast cancer ([place]) 2012   left breast  . Chronic edema   . Complication of anesthesia    difficulty waking up/dizzy/lightheaded  . Dysrhythmia    irregular heartbeat, takes digoxin  . Environmental allergies   . H/O varicella   . H/O varicose veins   . Hepatitis C   . History of measles, mumps, or rubella   . Hx of radiation therapy 12/15/11 - 01/29/12   left breast  . Hypothyroidism   . Shortness of breath   . Thrombocytopenia, primary ([place])   . TTP (thrombotic thrombocytopenic purpura) ([place])    PAST SURGICAL HISTORY: Past Surgical History:  Procedure Laterality Date  . BREAST LUMPECTOMY  08/03/11   left lumpectomy and slnbx,T1cN0,triple pos  . elbow pins Left   . FOOT SURGERY Left    multiple  . PORTACATH PLACEMENT  08/03/2011   Procedure: INSERTION PORT-A-CATH;  Surgeon: Merrie Roof, MD;  Location: [facility];  Service: General;  Laterality: Right;  . TOTAL HIP ARTHROPLASTY Bilateral  FAMILY HISTORY Family History  Problem Relation Age of Onset  . COPD Father   . Brain cancer Maternal Grandfather   . Alcohol abuse Maternal Grandfather   . Heart disease Neg Hx   . Hyperlipidemia Neg Hx   . Hypertension Neg Hx   Patient's father died at the age of 65 from emphysema in the setting of tobacco abuse; patient's mother is alive. The patient has one brother and one sister; there is no history of breast or ovarian cancer in the immediate family   Gynecologic history: She is [place] P0 menarche age 49 most  recent period July of 2008 she never took hormone replacement   Social History: (Updated 12/04/2012) The patient is a homemaker; she used to work in an office previously. Her husband [name] is a Scientist, research (physical sciences), but currently working a temporary job in [place]. They moved here from [facility] Oct 2012. Her dog's name is Eduard Clos (a dachshund/chihuahua mix)   ADVANCED DIRECTIVES: her husband is her healthcare power of attorney  HEALTH MAINTENANCE: (Updated 12/04/2012) Social History  Substance Use Topics  . Smoking status: Never Smoker  . Smokeless tobacco: Never Used  . Alcohol use 0.6 oz/week    1 Glasses of wine per week     Colonoscopy: Signoidoscopy Aug 2014, Dr. Olevia Perches  PAP: Oct 2014, Dr. Renold Genta  Bone density: November 2014, [facility], Normal  Lipid panel: Oct. 2014, Dr. Renold Genta   Allergies  Allergen Reactions  . Aspirin Other (See Comments)    Pt has a hx of TTP.   . Nsaids Other (See Comments)    Pt has a hx of TTP.     Current Outpatient Prescriptions  Medication Sig Dispense Refill  . acetaminophen (TYLENOL) 500 MG tablet Take 500 mg by mouth every 6 (six) hours as needed for mild pain, moderate pain or headache.     . albuterol (PROVENTIL HFA;VENTOLIN HFA) 108 (90 BASE) MCG/ACT inhaler Inhale 2 puffs into the lungs every 6 (six) hours as needed for wheezing or shortness of breath.     . budesonide (PULMICORT) 180 MCG/ACT inhaler Inhale 1 puff into the lungs daily.    . cholecalciferol (VITAMIN D) 1000 UNITS tablet Take 3,000 Units by mouth daily.     . citalopram (CELEXA) 10 MG tablet Take 10 mg by mouth every evening.    . digoxin (LANOXIN) 0.25 MG tablet Take 1 tablet (0.25 mg total) by mouth daily. 90 tablet 3  . fluticasone (FLONASE) 50 MCG/ACT nasal spray Place 2 sprays into both nostrils daily as needed for rhinitis.    Marland Kitchen glucosamine-chondroitin 500-400 MG tablet Take 1 tablet by mouth daily.     Marland Kitchen letrozole (FEMARA) 2.5 MG tablet Take 1 tablet (2.5 mg total) by mouth  daily. 90 tablet 0  . levothyroxine (SYNTHROID, LEVOTHROID) 88 MCG tablet TAKE ONE TABLET BY MOUTH EVERY DAY 90 tablet 0  . montelukast (SINGULAIR) 10 MG tablet Take 10 mg by mouth daily.    . Multiple Vitamin (MULTIVITAMIN WITH MINERALS) TABS tablet Take 1 tablet by mouth daily.    . ondansetron (ZOFRAN) 4 MG tablet Take 1 tablet (4 mg total) by mouth every 8 (eight) hours as needed for nausea or vomiting. 20 tablet 0  . potassium chloride SA (K-DUR,KLOR-CON) 20 MEQ tablet TAKE ONE TABLET BY MOUTH EVERY DAY. 30 tablet 11  . torsemide (DEMADEX) 20 MG tablet Take 20 mg by mouth daily.    . vitamin B-12 (CYANOCOBALAMIN) 1000 MCG tablet Take 2,000 mcg by mouth daily.  No current facility-administered medications for this visit.     OBJECTIVE: Middle-aged white woman In no acute distress Vitals:   08/18/16 1442  BP: 133/70  Pulse: 95  Resp: 17  Temp: 98.1 F (36.7 C)     Body mass index is 36.34 kg/m.    ECOG FS: 1 Filed Weights   08/18/16 1442  Weight: 232 lb (105.2 kg)   Sclerae unicteric, pupils round and equal Oropharynx clear and moist-- no thrush or other lesions No cervical or supraclavicular adenopathy Lungs no rales or rhonchi Heart regular rate and rhythm Abd soft, obese, nontender, positive bowel sounds MSK no focal spinal tenderness Neuro: nonfocal, well oriented, pleasant affect Breasts: The right breast is benign. The left breast is status post lumpectomy followed by radiation, with no evidence of local recurrence. Left axilla is benign  LAB RESULTS: Lab Results  Component Value Date   WBC 6.4 08/13/2016   NEUTROABS 4.7 08/13/2016   HGB 12.5 08/13/2016   HCT 38.1 08/13/2016   MCV 93.8 08/13/2016   PLT 144 (L) 08/13/2016      Chemistry      Component Value Date/Time   NA 141 08/13/2016 1125   K 3.9 08/13/2016 1125   CL 101 06/29/2016 1103   CL 108 (H) 12/05/2012 0953   CO2 28 08/13/2016 1125   BUN 9.4 08/13/2016 1125   CREATININE 0.8 08/13/2016 1125       Component Value Date/Time   CALCIUM 8.4 08/13/2016 1125   ALKPHOS 72 08/13/2016 1125   AST 25 08/13/2016 1125   ALT 31 08/13/2016 1125   BILITOT 0.59 08/13/2016 1125      STUDIES: Repeat [facility] density at [facility] 08/27/2015 was normal.  ASSESSMENT: 60 y.o.  [place] woman originally from [place]   1. Status post left lumpectomy and sentinel lymph node sampling 08/03/2011 for a T1c N0 (Stage IA) invasive ductal carcinoma, high-grade, triple positive.   2. treated in the adjuvant setting with 4 doses of docetaxel and cyclophosphamide completed 11/16/2011 given along with trastuzumab  3. Trastuzumab continued to total of one year (to December 2013)  4. Status post radiation therapy, completed late May of 2013.  5. began on anastrozole in early June 2013. Discontinued in September 2013 due to in tolerance (rash). Started on letrozole in October 2013.  (a) DEXA scan at [facility] 07/17/2013 was normal  (b) Repeat DEXA scan at [facility] 08/27/2015 remains normal while    PLAN: Saray is now 5 years out from definitive surgery for her breast cancer with no evidence of disease recurrence. This is very favorable.  She has been on anti-estrogens since June 2013. She will complete 5 years May of next year. We bargained a little because she really wanted to get off anastrozole today so she will actually take 3 more months and then stop. At this point I feel comfortable releasing her to her primary care physician. All she will need in terms of breast cancer follow-up is a yearly physician breast exam and her yearly mammography  I'll be glad to see Lashauna again at any point in the future if on when the need arises, but as of now we are making no further routine appointments for her here. Chauncey Cruel, MD    08/18/2016

## 2016-08-20 ENCOUNTER — Ambulatory Visit: Payer: BLUE CROSS/BLUE SHIELD | Admitting: Oncology

## 2016-08-25 ENCOUNTER — Other Ambulatory Visit: Payer: Self-pay | Admitting: Nurse Practitioner

## 2016-09-09 ENCOUNTER — Encounter: Payer: Self-pay | Admitting: Oncology

## 2016-09-11 ENCOUNTER — Other Ambulatory Visit: Payer: Self-pay | Admitting: Internal Medicine

## 2016-10-22 ENCOUNTER — Other Ambulatory Visit: Payer: Self-pay | Admitting: Internal Medicine

## 2016-11-24 ENCOUNTER — Other Ambulatory Visit: Payer: Self-pay

## 2016-11-24 MED ORDER — LEVOTHYROXINE SODIUM 88 MCG PO TABS: 88.0000 ug | ORAL_TABLET | Freq: Every day | ORAL | 0 refills | Status: DC

## 2016-11-26 ENCOUNTER — Other Ambulatory Visit: Payer: Self-pay | Admitting: Oncology

## 2016-11-26 DIAGNOSIS — Z853 Personal history of malignant neoplasm of breast: Secondary | ICD-10-CM

## 2016-12-02 ENCOUNTER — Ambulatory Visit
Admission: RE | Admit: 2016-12-02 | Discharge: 2016-12-02 | Disposition: A | Discharge status: Home / Self Care | Payer: Managed Care, Other (non HMO) | Source: Ambulatory Visit | Attending: Internal Medicine | Admitting: Internal Medicine

## 2016-12-02 ENCOUNTER — Ambulatory Visit (INDEPENDENT_AMBULATORY_CARE_PROVIDER_SITE_OTHER): Payer: Managed Care, Other (non HMO) | Admitting: Internal Medicine

## 2016-12-02 ENCOUNTER — Encounter: Payer: Self-pay | Admitting: Internal Medicine

## 2016-12-02 VITALS — BP 110/80 | HR 102 | Temp 97.4°F | Ht 67.0 in | Wt 230.3 lb

## 2016-12-02 DIAGNOSIS — R0789 Other chest pain: Secondary | ICD-10-CM

## 2016-12-02 DIAGNOSIS — F419 Anxiety disorder, unspecified: Secondary | ICD-10-CM

## 2016-12-02 DIAGNOSIS — F418 Other specified anxiety disorders: Secondary | ICD-10-CM | POA: Diagnosis not present

## 2016-12-02 DIAGNOSIS — R0781 Pleurodynia: Secondary | ICD-10-CM

## 2016-12-02 DIAGNOSIS — F32A Depression, unspecified: Secondary | ICD-10-CM

## 2016-12-02 DIAGNOSIS — F329 Major depressive disorder, single episode, unspecified: Secondary | ICD-10-CM

## 2016-12-02 MED ORDER — CYCLOBENZAPRINE HCL 10 MG PO TABS: 10.0000 mg | ORAL_TABLET | Freq: Three times a day (TID) | ORAL | 2 refills | Status: DC | PRN

## 2016-12-02 MED ORDER — PREDNISONE 10 MG PO TABS: ORAL_TABLET | ORAL | 0 refills | Status: DC

## 2016-12-03 NOTE — Patient Instructions (Signed)
Take prednisone in tapering course as directed. May take Flexeril at bedtime. Recommend moist heat.

## 2016-12-03 NOTE — Progress Notes (Signed)
   Subjective:    Patient ID: Tara [name], female    DOB: 1956/01/05, 61 y.o.   MRN: [id number]  HPI 61 year old Female with history of anxiety took a job recently at [employer] on [employer]. She was looking to find some work to do outside the home and was actually enjoying it , but it was quite a bit of work. 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 She started to experience some right rib cage pain. After a couple of days,  she quit her job. She doesn't intend to return to the job. She wants to look for another type of less physical position.    Review of Systems     Objective:   Physical Exam  She has point tenderness on her right lateral rib cage area near the axillary line. Rib detail films shows no evidence of fracture.      Assessment & Plan:  Right chest wall pain  Plan: She has a remote history of TTP and has been told to not take insulin aids. Treated her with Sterapred DS 10 mg 6 day dosepak. She may take Flexeril at bedtime. She does not want to go to physical therapy. Suggested moist heat.

## 2016-12-07 ENCOUNTER — Other Ambulatory Visit: Payer: Self-pay | Admitting: Internal Medicine

## 2016-12-10 ENCOUNTER — Other Ambulatory Visit: Payer: Self-pay | Admitting: Internal Medicine

## 2016-12-13 ENCOUNTER — Encounter: Payer: Self-pay | Admitting: Internal Medicine

## 2016-12-14 DIAGNOSIS — R0789 Other chest pain: Secondary | ICD-10-CM

## 2016-12-14 DIAGNOSIS — M62830 Muscle spasm of back: Secondary | ICD-10-CM

## 2016-12-23 ENCOUNTER — Ambulatory Visit: Payer: Managed Care, Other (non HMO) | Admitting: Physical Therapy

## 2017-01-22 ENCOUNTER — Emergency Department (HOSPITAL_BASED_OUTPATIENT_CLINIC_OR_DEPARTMENT_OTHER)
Admission: EM | Admit: 2017-01-22 | Discharge: 2017-01-22 | Disposition: A | Discharge status: Home / Self Care | Payer: Managed Care, Other (non HMO) | Attending: Emergency Medicine | Admitting: Emergency Medicine

## 2017-01-22 ENCOUNTER — Encounter (HOSPITAL_BASED_OUTPATIENT_CLINIC_OR_DEPARTMENT_OTHER): Payer: Self-pay

## 2017-01-22 ENCOUNTER — Emergency Department (HOSPITAL_BASED_OUTPATIENT_CLINIC_OR_DEPARTMENT_OTHER): Payer: Managed Care, Other (non HMO)

## 2017-01-22 DIAGNOSIS — Y939 Activity, unspecified: Secondary | ICD-10-CM | POA: Diagnosis not present

## 2017-01-22 DIAGNOSIS — J45909 Unspecified asthma, uncomplicated: Secondary | ICD-10-CM | POA: Diagnosis not present

## 2017-01-22 DIAGNOSIS — Y929 Unspecified place or not applicable: Secondary | ICD-10-CM | POA: Insufficient documentation

## 2017-01-22 DIAGNOSIS — S99921A Unspecified injury of right foot, initial encounter: Secondary | ICD-10-CM | POA: Diagnosis present

## 2017-01-22 DIAGNOSIS — Y999 Unspecified external cause status: Secondary | ICD-10-CM | POA: Diagnosis not present

## 2017-01-22 DIAGNOSIS — S90111A Contusion of right great toe without damage to nail, initial encounter: Secondary | ICD-10-CM | POA: Diagnosis not present

## 2017-01-22 DIAGNOSIS — W01198A Fall on same level from slipping, tripping and stumbling with subsequent striking against other object, initial encounter: Secondary | ICD-10-CM | POA: Insufficient documentation

## 2017-01-22 MED ORDER — HYDROCODONE-ACETAMINOPHEN 5-325 MG PO TABS: 1.0000 | ORAL_TABLET | Freq: Three times a day (TID) | ORAL | 0 refills | Status: DC | PRN

## 2017-01-22 MED FILL — HYDROCODON-APAP 5-325: 5-325 | 2 days supply | Qty: 8 | Fill #0

## 2017-01-22 NOTE — ED Provider Notes (Signed)
[facility] DEPT [facility] Provider Note   CSN: [id number] Arrival date & time: 01/22/17  1210     History   Chief Complaint Chief Complaint  Patient presents with  . Toe Injury    HPI Tara [name] is a 61 y.o. female.  The history is provided by the patient. No language interpreter was used.    Tara [name] is a 61 y.o. female who presents to the Emergency Department complaining of toe pain.  3 days ago she was walking and tripped, striking her right great toe on a door sill. She reports progressively worsening pain to the right great toe. She has pain with walking. No fevers, nausea, vomiting. Symptoms are moderate and constant in nature.  Past Medical History:  Diagnosis Date  . Anxiety   . Arthritis   . Asthma   . Blood transfusion   . Breast cancer ([place]) 2012   left breast  . Chronic edema   . Complication of anesthesia    difficulty waking up/dizzy/lightheaded  . Dysrhythmia    irregular heartbeat, takes digoxin  . Environmental allergies   . H/O varicella   . H/O varicose veins   . Hepatitis C   . History of measles, mumps, or rubella   . Hx of radiation therapy 12/15/11 - 01/29/12   left breast  . Hypothyroidism   . Shortness of breath   . Thrombocytopenia, primary ([place])   . TTP (thrombotic thrombocytopenic purpura) [facility])     Patient Active Problem List   Diagnosis Date Noted  . Paresthesia 03/30/2016  . Hypomagnesemia 03/29/2016    Class: Acute  . Malignant neoplasm of axillary tail of left female breast ([place]) 08/12/2015  . Malignant neoplasm of upper-outer quadrant of left breast in female, estrogen receptor positive ([place]) 07/17/2014  . Hypokalemia 12/29/2013  . Allergic rhinitis 10/29/2012  . Asthma 10/29/2012  . Status post bilateral hip replacements 10/29/2012  . History of TTP (thrombotic thrombocytopenic purpura) 10/29/2012  . History of hepatitis C 10/29/2012  . Hx of radiation therapy   . Lymphedema of arm 12/15/2011  . Shortness of breath  11/19/2011  . Rosacea, acne 09/28/2011  . Dysrhythmia, cardiac 09/28/2011  . Hypothyroidism 09/07/2011    Past Surgical History:  Procedure Laterality Date  . BREAST LUMPECTOMY  08/03/11   left lumpectomy and slnbx,T1cN0,triple pos  . elbow pins Left   . FOOT SURGERY Left    multiple  . PORTACATH PLACEMENT  08/03/2011   Procedure: INSERTION PORT-A-CATH;  Surgeon: Merrie Roof, MD;  Location: [facility];  Service: General;  Laterality: Right;  . TOTAL HIP ARTHROPLASTY Bilateral     OB History    Gravida Para Term Preterm AB Living   0 0 0 0 0 0   SAB TAB Ectopic Multiple Live Births   0 0 0 0         Home Medications    Prior to Admission medications   Medication Sig Start Date End Date Taking? Authorizing Provider  acetaminophen (TYLENOL) 500 MG tablet Take 500 mg by mouth every 6 (six) hours as needed for mild pain, moderate pain or headache.     [provider]  albuterol (PROVENTIL HFA;VENTOLIN HFA) 108 (90 BASE) MCG/ACT inhaler Inhale 2 puffs into the lungs every 6 (six) hours as needed for wheezing or shortness of breath.     [provider]  budesonide (PULMICORT) 180 MCG/ACT inhaler Inhale 1 puff into the lungs daily.    [provider]  cholecalciferol (  VITAMIN D) 1000 UNITS tablet Take 3,000 Units by mouth daily.     [provider]  citalopram (CELEXA) 10 MG tablet TAKE ONE TABLET BY MOUTH EVERY DAY 09/11/16   [name], MD  cyclobenzaprine (FLEXERIL) 10 MG tablet Take 1 tablet (10 mg total) by mouth 3 (three) times daily as needed for muscle spasms. 12/02/16   [name], MD  digoxin (LANOXIN) 0.25 MG tablet TAKE ONE TABLET BY MOUTH DAILY. 12/10/16   [name], MD  fluticasone (FLONASE) 50 MCG/ACT nasal spray Place 2 sprays into both nostrils daily as needed for rhinitis.    [provider]  glucosamine-chondroitin 500-400 MG tablet Take 1 tablet by mouth daily.     [provider]  letrozole (FEMARA) 2.5  MG tablet TAKE ONE TABLET (2.5 MG) BY MOUTH DAILY. 08/21/16   Magrinat, Virgie Dad, MD  levothyroxine (SYNTHROID, LEVOTHROID) 88 MCG tablet Take 1 tablet (88 mcg total) by mouth daily. 11/24/16   [name], MD  montelukast (SINGULAIR) 10 MG tablet TAKE ONE TABLET (10 MG) BY MOUTH AT BEDTIME. 12/07/16   [name], MD  Multiple Vitamin (MULTIVITAMIN WITH MINERALS) TABS tablet Take 1 tablet by mouth daily.    [provider]  ondansetron (ZOFRAN) 4 MG tablet Take 1 tablet (4 mg total) by mouth every 8 (eight) hours as needed for nausea or vomiting. 10/01/15   Baxley, Cresenciano Lick, MD  potassium chloride SA (K-DUR,KLOR-CON) 20 MEQ tablet TAKE ONE TABLET BY MOUTH EVERY DAY. 07/13/16   [name], MD  predniSONE (DELTASONE) 10 MG tablet Take in tapering course as directed 6-5-4-3-2-1 12/02/16   [name], MD  torsemide (DEMADEX) 20 MG tablet TAKE ONE TABLET BY MOUTH EVERY DAY. 10/22/16   [name], MD  vitamin B-12 (CYANOCOBALAMIN) 1000 MCG tablet Take 2,000 mcg by mouth daily.    [provider]    Family History Family History  Problem Relation Age of Onset  . COPD Father   . Brain cancer Maternal Grandfather   . Alcohol abuse Maternal Grandfather   . Heart disease Neg Hx   . Hyperlipidemia Neg Hx   . Hypertension Neg Hx     Social History Social History  Substance Use Topics  . Smoking status: Never Smoker  . Smokeless tobacco: Never Used  . Alcohol use Yes     Comment: weekly     Allergies   Aspirin and Nsaids   Review of Systems Review of Systems  All other systems reviewed and are negative.    Physical Exam Updated Vital Signs BP 126/72 (BP Location: Right Arm)   Pulse 92   Temp 99.1 F (37.3 C) (Oral)   Resp 18   Ht 5\' 7"  (1.702 m)   Wt 107 kg (236 lb)   SpO2 100%   BMI 36.96 kg/m   Physical Exam  Constitutional: She is oriented to person, place, and time. She appears well-developed and well-nourished.  HENT:  Head: Normocephalic  and atraumatic.  Cardiovascular: Normal rate and regular rhythm.   Pulmonary/Chest: Effort normal. No respiratory distress.  Musculoskeletal:  2+ DP pulses bilaterally.  Moderate erythema and edema to the left great toe, first MTP joints and distal foot. Point of maximal tenderness is over the first MTP joint. No cellulitis, abscess, breaks in the skin.  Neurological: She is alert and oriented to person, place, and time.  Skin: Skin is warm and dry. Capillary refill takes less than 2 seconds.  Psychiatric: She has a normal mood and affect. Her behavior is normal.  Nursing note and vitals reviewed.    ED Treatments / Results  Labs (all labs ordered are listed, but only abnormal results are displayed) Labs Reviewed - No data to display  EKG  EKG Interpretation None       Radiology Dg Toe Great Right  Result Date: 01/22/2017 CLINICAL DATA:  Fall.  Injury to right great toe. EXAM: RIGHT GREAT TOE COMPARISON:  None. FINDINGS: There is no evidence of fracture or dislocation. There is no evidence of arthropathy or other focal bone abnormality. Diffuse soft tissue swelling noted. IMPRESSION: 1. No acute bone abnormality. 2. Diffuse soft tissue swelling. . Electronically Signed   By: Kerby Moors M.D.   On: 01/22/2017 12:44    Procedures Procedures (including critical care time)  Medications Ordered in ED Medications - No data to display   Initial Impression / Assessment and Plan / ED Course  I have reviewed the triage vital signs and the nursing notes.  Pertinent labs & imaging results that were available during my care of the patient were reviewed by me and considered in my medical decision making (see chart for details).     Patient here for evaluation of right great toe pain following an injury 3 days ago. She is neurovascularly intact on examination. Exam is consistent with contusion, no evidence of acute fracture or infectious process. Placing in postop shoe for comfort.  Discussed outpatient follow-up and return precautions.  Final Clinical Impressions(s) / ED Diagnoses   Final diagnoses:  None    New Prescriptions New Prescriptions   No medications on file     Quintella Reichert, MD 01/22/17 1401

## 2017-01-22 NOTE — ED Triage Notes (Signed)
Pt states she fell injured right great toe 3 days ago-presents to triage in w/c-NAD

## 2017-01-22 NOTE — ED Notes (Signed)
Pt wheeled out by ED staff. Taken to [facility] and pharmacy. Security with pt to assist to car when Rx are ready. Pt's husband with her to drive

## 2017-01-28 ENCOUNTER — Encounter: Payer: Self-pay | Admitting: Internal Medicine

## 2017-01-29 ENCOUNTER — Other Ambulatory Visit: Payer: Self-pay | Admitting: Internal Medicine

## 2017-02-21 ENCOUNTER — Other Ambulatory Visit: Payer: Self-pay | Admitting: Internal Medicine

## 2017-03-07 ENCOUNTER — Other Ambulatory Visit: Payer: Self-pay | Admitting: Internal Medicine

## 2017-03-08 ENCOUNTER — Other Ambulatory Visit: Payer: Self-pay | Admitting: Internal Medicine

## 2017-03-14 ENCOUNTER — Encounter: Payer: Self-pay | Admitting: Internal Medicine

## 2017-03-19 ENCOUNTER — Encounter: Payer: Self-pay | Admitting: Internal Medicine

## 2017-03-19 ENCOUNTER — Ambulatory Visit (INDEPENDENT_AMBULATORY_CARE_PROVIDER_SITE_OTHER): Payer: Managed Care, Other (non HMO) | Admitting: Internal Medicine

## 2017-03-19 VITALS — BP 110/88 | HR 82 | Temp 98.0°F | Ht 67.0 in | Wt 232.0 lb

## 2017-03-19 DIAGNOSIS — R0781 Pleurodynia: Secondary | ICD-10-CM

## 2017-03-19 LAB — BASIC METABOLIC PANEL
BUN: 17 mg/dL (ref 7–25)
CO2: 27 mmol/L (ref 20–31)
Calcium: 8.7 mg/dL (ref 8.6–10.4)
Chloride: 96 mmol/L — ABNORMAL LOW (ref 98–110)
Creat: 0.92 mg/dL (ref 0.50–0.99)
Glucose, Bld: 103 mg/dL — ABNORMAL HIGH (ref 65–99)
Potassium: 4.2 mmol/L (ref 3.5–5.3)
Sodium: 136 mmol/L (ref 135–146)

## 2017-03-19 MED ORDER — DULOXETINE HCL 30 MG PO CPEP: 30.0000 mg | ORAL_CAPSULE | Freq: Every day | ORAL | 3 refills | Status: DC

## 2017-03-19 NOTE — Progress Notes (Signed)
   Subjective:    Patient ID: Tara [name], female    DOB: 08/27/1956, 61 y.o.   MRN: [id number]  HPI Has been having left lower rib pain since March. Started after working at [employer]. PT  since late March early April has not helped. Tylenol, Ibuprofen, Flexeril have not helped.  Hx breast cancer 2011.  Pain is described as 8 out of 10.  TENS unit has been of minimal benefit.  Rib detail Xray in April showed no fracture.  Review of Systems     Objective:   Physical Exam Chest clear to auscultation. Has point tenderness left lateral lower rib       Assessment & Plan:  Protracted left rib pain Hx breast cancer Plan:  CT of chest with contrast  Sed rate and B met  Cymbalta 30 mg daily

## 2017-03-19 NOTE — Patient Instructions (Signed)
CT of chest. Cymbalta 30 mg daily

## 2017-03-19 NOTE — Addendum Note (Signed)
Addended by: Mady Haagensen on: 03/19/2017 11:19 AM   Modules accepted: Orders

## 2017-03-23 ENCOUNTER — Telehealth: Payer: Self-pay

## 2017-03-23 DIAGNOSIS — Z853 Personal history of malignant neoplasm of breast: Secondary | ICD-10-CM

## 2017-03-23 DIAGNOSIS — R0781 Pleurodynia: Secondary | ICD-10-CM

## 2017-03-23 NOTE — Telephone Encounter (Signed)
Order for CT Scan, Sharyn Lull was unable to order and she obtained PA

## 2017-03-25 ENCOUNTER — Ambulatory Visit
Admission: RE | Admit: 2017-03-25 | Discharge: 2017-03-25 | Disposition: A | Discharge status: Home / Self Care | Payer: Managed Care, Other (non HMO) | Source: Ambulatory Visit | Attending: Internal Medicine | Admitting: Internal Medicine

## 2017-03-25 DIAGNOSIS — Z853 Personal history of malignant neoplasm of breast: Secondary | ICD-10-CM

## 2017-03-25 DIAGNOSIS — R0781 Pleurodynia: Secondary | ICD-10-CM

## 2017-03-25 MED ORDER — IOPAMIDOL (ISOVUE-300) INJECTION 61%
75.0000 mL | Freq: Once | INTRAVENOUS | Status: AC | PRN
  Administered 2017-03-25: 75 mL via INTRAVENOUS

## 2017-03-26 ENCOUNTER — Encounter: Payer: Self-pay | Admitting: Internal Medicine

## 2017-03-26 ENCOUNTER — Ambulatory Visit (INDEPENDENT_AMBULATORY_CARE_PROVIDER_SITE_OTHER): Payer: Managed Care, Other (non HMO) | Admitting: Internal Medicine

## 2017-03-26 VITALS — BP 110/70 | HR 78 | Temp 98.1°F | Wt 238.0 lb

## 2017-03-26 DIAGNOSIS — Z8619 Personal history of other infectious and parasitic diseases: Secondary | ICD-10-CM | POA: Diagnosis not present

## 2017-03-26 DIAGNOSIS — R590 Localized enlarged lymph nodes: Secondary | ICD-10-CM

## 2017-03-26 DIAGNOSIS — R0781 Pleurodynia: Secondary | ICD-10-CM | POA: Diagnosis not present

## 2017-03-26 DIAGNOSIS — Z111 Encounter for screening for respiratory tuberculosis: Secondary | ICD-10-CM

## 2017-03-26 LAB — SEDIMENTATION RATE: Sed Rate: 32 mm/hr — ABNORMAL HIGH (ref 0–30)

## 2017-03-27 LAB — QUANTIFERON TB GOLD ASSAY (BLOOD)
Interferon Gamma Release Assay: NEGATIVE
Mitogen-Nil: 7.23 IU/mL
Quantiferon Nil Value: 0.07 IU/mL
Quantiferon Tb Ag Minus Nil Value: 0.05 IU/mL

## 2017-03-27 LAB — HEPATITIS C ANTIBODY: HCV Ab: REACTIVE — AB

## 2017-03-27 NOTE — Progress Notes (Signed)
   Subjective:    Patient ID: Tara [name], female    DOB: Dec 31, 1955, 61 y.o.   MRN: [id number]  HPI Continues to have left rib pain. We did CT of the chest to see if she had recurrence of her cancer or some other reason to have this persistent left rib pain despite physical therapy which really has not helped very much. Left rib detail films had been negative.  CT of the chest shows a 1.9 cm low-density soft tissue focus in the left breast unchanged and likely related to prior lumpectomy according to radiologist. No axillary lymph nodes identified. There are multiple borderline and mildly enlarged lymph nodes in the mediastinum which have enlarged from prior CT. The largest one is in the right paratracheal region. No hilar lymph nodes are intensified that are enlarged. Upper abdominal lymph nodes have increased in size.  There are no suspicious osseous lesions. There is a chronic L2 superior plate compression fracture.    Review of Systems     Objective:   Physical Exam  Still has point tenderness left lower lateral rib      Assessment & Plan:  Patient has a remote history of hepatitis C. I'm checking a viral load today. We did draw clock to [facility] test for TB testing.  Not sure what to make of these findings. We will ask Dr. Jana Hakim  to see her. She fears her cancer may return.

## 2017-03-27 NOTE — Patient Instructions (Signed)
To see oncologist regarding CT findings.

## 2017-03-29 ENCOUNTER — Telehealth: Payer: Self-pay | Admitting: Oncology

## 2017-03-29 NOTE — Telephone Encounter (Signed)
Pt called to sch follow up appt per PCP instructions due to CT scan results. Gave pt next available appt

## 2017-03-30 LAB — PROTEIN ELECTROPHORESIS, SERUM
Albumin ELP: 3.9 g/dL (ref 3.8–4.8)
Alpha-1-Globulin: 0.4 g/dL — ABNORMAL HIGH (ref 0.2–0.3)
Alpha-2-Globulin: 0.9 g/dL (ref 0.5–0.9)
Beta 2: 0.3 g/dL (ref 0.2–0.5)
Beta Globulin: 0.5 g/dL (ref 0.4–0.6)
Gamma Globulin: 1 g/dL (ref 0.8–1.7)
Total Protein, Serum Electrophoresis: 7 g/dL (ref 6.1–8.1)

## 2017-03-30 LAB — HEPATITIS C RNA QUANTITATIVE
HCV Quantitative Log: 6.14 Log IU/mL — ABNORMAL HIGH
HCV Quantitative: 1370000 IU/mL — ABNORMAL HIGH

## 2017-03-31 ENCOUNTER — Telehealth: Payer: Self-pay

## 2017-03-31 NOTE — Telephone Encounter (Signed)
-----   Message from [name], MD sent at 03/30/2017  6:14 PM EDT ----- Please get referral to ID clinic. Speak with someone on phone please and put in referral in EPIC. Records are in EPIC. Increase in Quantitative HCV.

## 2017-03-31 NOTE — Telephone Encounter (Signed)
Referral already done and appt made

## 2017-04-01 ENCOUNTER — Encounter: Payer: Self-pay | Admitting: Internal Medicine

## 2017-04-20 ENCOUNTER — Ambulatory Visit (HOSPITAL_BASED_OUTPATIENT_CLINIC_OR_DEPARTMENT_OTHER): Payer: Managed Care, Other (non HMO) | Admitting: Oncology

## 2017-04-20 ENCOUNTER — Ambulatory Visit (HOSPITAL_BASED_OUTPATIENT_CLINIC_OR_DEPARTMENT_OTHER): Payer: Managed Care, Other (non HMO)

## 2017-04-20 VITALS — BP 137/90 | HR 90 | Resp 18 | Ht 67.0 in | Wt 242.0 lb

## 2017-04-20 DIAGNOSIS — R591 Generalized enlarged lymph nodes: Secondary | ICD-10-CM

## 2017-04-20 DIAGNOSIS — C50612 Malignant neoplasm of axillary tail of left female breast: Secondary | ICD-10-CM

## 2017-04-20 DIAGNOSIS — Z853 Personal history of malignant neoplasm of breast: Secondary | ICD-10-CM

## 2017-04-20 DIAGNOSIS — C50412 Malignant neoplasm of upper-outer quadrant of left female breast: Secondary | ICD-10-CM

## 2017-04-20 DIAGNOSIS — Z170 Estrogen receptor positive status [ER+]: Principal | ICD-10-CM

## 2017-04-20 LAB — CBC & DIFF AND RETIC
BASO%: 0.4 % (ref 0.0–2.0)
Basophils Absolute: 0 10*3/uL (ref 0.0–0.1)
EOS%: 4.4 % (ref 0.0–7.0)
Eosinophils Absolute: 0.3 10*3/uL (ref 0.0–0.5)
HCT: 38.7 % (ref 34.8–46.6)
HGB: 12.9 g/dL (ref 11.6–15.9)
Immature Retic Fract: 4.1 % (ref 1.60–10.00)
LYMPH%: 13.7 % — ABNORMAL LOW (ref 14.0–49.7)
MCH: 32 pg (ref 25.1–34.0)
MCHC: 33.3 g/dL (ref 31.5–36.0)
MCV: 96 fL (ref 79.5–101.0)
MONO#: 0.6 10*3/uL (ref 0.1–0.9)
MONO%: 8.9 % (ref 0.0–14.0)
NEUT#: 5.1 10*3/uL (ref 1.5–6.5)
NEUT%: 72.6 % (ref 38.4–76.8)
Platelets: 168 10*3/uL (ref 145–400)
RBC: 4.03 10*6/uL (ref 3.70–5.45)
RDW: 14 % (ref 11.2–14.5)
Retic %: 1.62 % (ref 0.70–2.10)
Retic Ct Abs: 65.29 10*3/uL (ref 33.70–90.70)
WBC: 7.1 10*3/uL (ref 3.9–10.3)
lymph#: 1 10*3/uL (ref 0.9–3.3)

## 2017-04-20 LAB — COMPREHENSIVE METABOLIC PANEL
ALT: 35 U/L (ref 0–55)
AST: 32 U/L (ref 5–34)
Albumin: 3.7 g/dL (ref 3.5–5.0)
Alkaline Phosphatase: 81 U/L (ref 40–150)
Anion Gap: 11 mEq/L (ref 3–11)
BUN: 19.8 mg/dL (ref 7.0–26.0)
CO2: 28 mEq/L (ref 22–29)
Calcium: 9 mg/dL (ref 8.4–10.4)
Chloride: 97 mEq/L — ABNORMAL LOW (ref 98–109)
Creatinine: 1 mg/dL (ref 0.6–1.1)
EGFR: 64 mL/min/{1.73_m2} — ABNORMAL LOW (ref >90)
Glucose: 136 mg/dl (ref 70–140)
Potassium: 4 mEq/L (ref 3.5–5.1)
Sodium: 136 mEq/L (ref 136–145)
Total Bilirubin: 0.78 mg/dL (ref 0.20–1.20)
Total Protein: 7.4 g/dL (ref 6.4–8.3)

## 2017-04-20 LAB — LACTATE DEHYDROGENASE: LDH: 237 U/L (ref 125–245)

## 2017-04-20 LAB — CHCC SMEAR

## 2017-04-20 NOTE — Progress Notes (Signed)
ID: Tara [name]   DOB: August 10, 1956  MR#: [id number]  VZD#:[id number]  PCP:  [name], MD GYN: SU:  Autumn Messing, MD Other:  Delfin Edis, MD;  Arloa Koh, MD;  Glori Bickers, MD  CHIEF COMPLAINT:    Estrogen receptor positive breast cancer   CURRENT TREATMENT: Observation   HISTORY OF PRESENT ILLNESS:  from the original intake note:  Tara [name] noted a mass in her left breast and brought it to her physician's attention in [place], where she was residing. She was set up for mammography 06/24/2011 at [facility] in [place], [place]. This showed a suspicious abnormality in the left breast measuring approximately 10 mm. Physical examination showed a 1 cm firm superficial mass in the axillary region of the breast and ultrasound showed this to be 11 mm and spiculated.  Biopsy was performed June 25, 2011, and showed ([id number] at the [facility]) and invasive ductal carcinoma, grade 3, estrogen receptor 95% positive, progesterone receptor 40% positive, with an MIB-1 of 29%. The tumor is HER 2 positive by FISH with a ratio of 2.5. There is tumor heterogeneity noted.  With this information the case was presented Jul 08, 2011 at the multidisciplinary  breast cancer clinic. She proceeded to definitive left lumpectomy and sentinel lymph node sampling 08/03/2011. This confirmed a 1.6 cm invasive ductal carcinoma, grade 3. Margins were ample. The two sentinel lymph nodes were clear.   Her subsequent history is as detailed below.  INTERVAL HISTORY: Tara [name] returns today accompanied by her husband for evaluation of a new problem. After her release from follow-up here, she went off letrozole and generally has been doing well. She tells me she has had some back pain for about a year and a half and it would get a better and then worse. 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 She brought this to Dr. Verlene Mayer attention and since physical therapy was not a help a CT of the chest was obtained. This showed no evidence of recurrent cancer but it did show some upper abdominal lymph nodes and mild thoracic lymphadenopathy. This was felt to be nonspecific but raised the question of lymphoma which is the reason she is here today  Tara [name] is also being evaluated by infectious diseases for her hepatitis C situation.  REVIEW OF SYSTEMS: Tara [name] denies drenching sweats although she does have hot flashes at night, which are not quite the same thing. There has been no unexplained fatigue or unexplained weight loss. She denies pruritus or palpable adenopathy. Her pain is now much better on Cymbalta. She is also not working at the [employer] anymore. A detailed review of systems today was otherwise stable  PAST MEDICAL HISTORY: Past Medical History:  Diagnosis Date  . Anxiety   . Arthritis   . Asthma   . Blood transfusion   . Breast cancer ([place]) 2012   left breast  . Chronic edema   . Complication of anesthesia    difficulty waking up/dizzy/lightheaded  . Dysrhythmia    irregular heartbeat, takes digoxin  . Environmental allergies   . H/O varicella   . H/O varicose veins   . Hepatitis C   . History of measles, mumps, or rubella   . Hx of radiation therapy 12/15/11 - 01/29/12   left breast  . Hypothyroidism   . Shortness of breath   .  Thrombocytopenia, primary ([place])   . TTP (thrombotic thrombocytopenic purpura) ([place])    PAST SURGICAL HISTORY: Past Surgical History:  Procedure Laterality Date  . BREAST LUMPECTOMY  08/03/11   left lumpectomy and slnbx,T1cN0,triple pos  . elbow pins Left   . FOOT SURGERY Left    multiple  . PORTACATH PLACEMENT  08/03/2011   Procedure: INSERTION PORT-A-CATH;  Surgeon: Merrie Roof, MD;  Location: [facility];  Service: General;  Laterality: Right;  . TOTAL HIP ARTHROPLASTY Bilateral     FAMILY HISTORY Family History   Problem Relation Age of Onset  . COPD Father   . Brain cancer Maternal Grandfather   . Alcohol abuse Maternal Grandfather   . Heart disease Neg Hx   . Hyperlipidemia Neg Hx   . Hypertension Neg Hx   Patient's father died at the age of 60 from emphysema in the setting of tobacco abuse; patient's mother is alive. The patient has one brother and one sister; there is no history of breast or ovarian cancer in the immediate family   Gynecologic history: She is [place] [facility] menarche age 64 most recent period July of 2008 she never took hormone replacement   Social History: (Updated 12/04/2012) The patient is a homemaker; she used to work in an office previously. Her husband Tara [name] is a Scientist, research (physical sciences), but currently working a temporary job in [place]. They moved here from [facility] Oct 2012. Her dog's name is Eduard Clos (a dachshund/chihuahua mix)   ADVANCED DIRECTIVES: her husband is her healthcare power of attorney  HEALTH MAINTENANCE: (Updated 12/04/2012) Social History  Substance Use Topics  . Smoking status: Never Smoker  . Smokeless tobacco: Never Used  . Alcohol use Yes     Comment: weekly     Colonoscopy: Signoidoscopy Aug 2014, Dr. Olevia Perches  PAP: Oct 2014, Dr. Renold Genta  Bone density: November 2014, [facility], Normal  Lipid panel: Oct. 2014, Dr. Renold Genta   Allergies  Allergen Reactions  . Aspirin Other (See Comments)    Pt has a hx of TTP.   . Nsaids Other (See Comments)    Pt has a hx of TTP.     Current Outpatient Prescriptions  Medication Sig Dispense Refill  . acetaminophen (TYLENOL) 500 MG tablet Take 500 mg by mouth every 6 (six) hours as needed for mild pain, moderate pain or headache.     . albuterol (PROVENTIL HFA;VENTOLIN HFA) 108 (90 BASE) MCG/ACT inhaler Inhale 2 puffs into the lungs every 6 (six) hours as needed for wheezing or shortness of breath.     . budesonide (PULMICORT) 180 MCG/ACT inhaler Inhale 1 puff into the lungs daily.    . cholecalciferol (VITAMIN D) 1000 UNITS tablet  Take 3,000 Units by mouth daily.     . cyclobenzaprine (FLEXERIL) 10 MG tablet Take 1 tablet (10 mg total) by mouth 3 (three) times daily as needed for muscle spasms. 30 tablet 2  . digoxin (LANOXIN) 0.25 MG tablet TAKE ONE TABLET BY MOUTH DAILY. 90 tablet 3  . DULoxetine (CYMBALTA) 30 MG capsule Take 1 capsule (30 mg total) by mouth daily. 30 capsule 3  . fluticasone (FLONASE) 50 MCG/ACT nasal spray Place 2 sprays into both nostrils daily as needed for rhinitis.    Marland Kitchen glucosamine-chondroitin 500-400 MG tablet Take 1 tablet by mouth daily.     Marland Kitchen HYDROcodone-acetaminophen (NORCO/VICODIN) 5-325 MG tablet Take 1 tablet by mouth every 8 (eight) hours as needed. (Patient not taking: Reported on 03/26/2017) 8 tablet 0  . letrozole (FEMARA) 2.5  MG tablet TAKE ONE TABLET (2.5 MG) BY MOUTH DAILY. 90 tablet 2  . levothyroxine (SYNTHROID, LEVOTHROID) 88 MCG tablet TAKE ONE TABLET BY MOUTH DAILY 90 tablet 0  . montelukast (SINGULAIR) 10 MG tablet TAKE ONE TABLET (10 MG) BY MOUTH AT BEDTIME. 90 tablet 3  . Multiple Vitamin (MULTIVITAMIN WITH MINERALS) TABS tablet Take 1 tablet by mouth daily.    . potassium chloride SA (K-DUR,KLOR-CON) 20 MEQ tablet TAKE ONE TABLET BY MOUTH EVERY DAY 90 tablet 3  . torsemide (DEMADEX) 20 MG tablet TAKE ONE TABLET BY MOUTH EVERY DAY. 90 tablet 1  . vitamin B-12 (CYANOCOBALAMIN) 1000 MCG tablet Take 2,000 mcg by mouth daily.     No current facility-administered medications for this visit.     OBJECTIVE: Middle-aged white woman Who appears stated age 45:   04/20/17 1323  BP: 137/90  Pulse: 90  Resp: 18  SpO2: 96%     Body mass index is 37.9 kg/m.    ECOG FS: 1 Filed Weights   04/20/17 1323  Weight: 242 lb (109.8 kg)   Sclerae unicteric, EOMs intact Oropharynx clear and moist No cervical or supraclavicular adenopathy, no axillary or inguinal adenopathy Lungs no rales or rhonchi Heart regular rate and rhythm Abd soft, obese, nontender, positive bowel sounds MSK  no focal spinal tenderness, no upper extremity lymphedema Neuro: nonfocal, well oriented, appropriate affect Breasts: I do not palpate a suspicious mass in either breast. There are no skin or nipple changes of concern.  LAB RESULTS: Lab Results  Component Value Date   WBC 7.1 04/20/2017   NEUTROABS 5.1 04/20/2017   HGB 12.9 04/20/2017   HCT 38.7 04/20/2017   MCV 96.0 04/20/2017   PLT 168 04/20/2017      Chemistry      Component Value Date/Time   NA 136 04/20/2017 1422   K 4.0 04/20/2017 1422   CL 96 (L) 03/19/2017 1125   CL 108 (H) 12/05/2012 0953   CO2 28 04/20/2017 1422   BUN 19.8 04/20/2017 1422   CREATININE 1.0 04/20/2017 1422      Component Value Date/Time   CALCIUM 9.0 04/20/2017 1422   ALKPHOS 81 04/20/2017 1422   AST 32 04/20/2017 1422   ALT 35 04/20/2017 1422   BILITOT 0.78 04/20/2017 1422      STUDIES: Ct Chest W Contrast  Result Date: 03/25/2017 CLINICAL DATA:  Left rib pain. History of breast cancer status post left lumpectomy, chemotherapy, and radiation therapy. EXAM: CT CHEST WITH CONTRAST TECHNIQUE: Multidetector CT imaging of the chest was performed during intravenous contrast administration. CONTRAST:  76mL ISOVUE-300 IOPAMIDOL (ISOVUE-300) INJECTION 61% COMPARISON:  Rib radiographs 12/02/2016.  Chest CT 08/23/2015. FINDINGS: Cardiovascular: Normal caliber of the thoracic aorta. Normal heart size. No pericardial effusion. Mediastinum/Nodes: A 1.9 cm low-density soft tissue focus in the lateral left breast is unchanged and likely postoperative related to prior lumpectomy. No enlarged axillary lymph nodes are identified. There are multiple borderline and mildly enlarged lymph nodes in the mediastinum which have enlarged from the prior CT, many of which demonstrate an abnormal rounded morphology. The largest single lymph node measures 13 mm in short axis in the high right paratracheal station. No enlarged hilar lymph nodes are identified. The thyroid and esophagus  are grossly unremarkable. Lungs/Pleura: No pleural effusion or pneumothorax. Mild scarring is again noted in the right lung. A 2 mm subpleural nodule in the left lower lobe is unchanged and likely benign (series 5, image 91). No suspicious lung nodules are  identified. Upper Abdomen: Small hypodense lesions scattered throughout the liver do not appear significantly changed from the prior CT, with the largest measuring 1.3 cm in the right hepatic dome. Upper abdominal lymph nodes have increased in size including a 1.7 cm short axis portacaval lymph node (series 2, image 148) and a 1.4 cm lymph node anterior to the celiac artery trifurcation. Two small low-density lesions in the upper pole of the left kidney measure up to 1.3 cm in size, unchanged and likely representing cysts. A subcentimeter low-density lesion is noted in the right kidney. Musculoskeletal: No suspicious osseous lesions are identified. Thoracic spondylosis and a T7 superior endplate Schmorl's node deformity are again seen. There is also a chronic L2 superior plate compression fracture. No rib fracture or other cause of left-sided rib pain is identified. IMPRESSION: 1. New mild thoracic lymphadenopathy and increased upper abdominal lymphadenopathy, nonspecific but malignancy/metastatic disease is a concern. An inflammatory etiology is also possible, particularly given the pre-existing small upper abdominal lymph nodes on the prior CT and the patient's history of hepatitis C. 2. No other evidence of metastatic disease in the chest. 3. No rib fracture or osseous lesion identified. Electronically Signed   By: Logan Bores M.D.   On: 03/25/2017 14:31     ASSESSMENT: 61 y.o.  [place] woman originally from [place]   1. Status post left lumpectomy and sentinel lymph node sampling 08/03/2011 for a T1c N0 (Stage IA) invasive ductal carcinoma, high-grade, triple positive.   2. treated in the adjuvant setting with 4 doses of docetaxel and  cyclophosphamide completed 11/16/2011 given along with trastuzumab  3. Trastuzumab continued to total of one year (to December 2013)  4. Status post radiation therapy, completed late May of 2013.  5. began on anastrozole in early June 2013. Discontinued in September 2013 due to intolerance (rash). Started on letrozole in October 2013, completing 5 years May 2017  (a) DEXA scan at [facility] 07/17/2013 was normal  (b) Repeat D[facility] scan at [facility] 08/27/2015 remains normal while    PLAN: Meliss has a variety of symptoms which, if that were all we had, would never lead [name] to think that she has lymphoma.  She had a CT of the chest obtained for reasons not related to lymphoma evaluation. This shows mild but measurable adenopathy.  The differential diagnosis there is vast. Any kind of immune stimulus can cause it including hepatitis C which we know is active in [place]. Any temporary or chronic bowel irritation, lung irritation, or anything that irritates or stimulates the immune system could be involved.  She understands I do not believe that she has lymphoma. Nevertheless proving a negative is difficult. The way we are going to go about it is to start with some lab work. If the lab work is suspicious for lymphoma I will set her up for a PET scan.  If, as I think is more likely, the lab work is unremarkable, then all I am going to do is see her again in a year and repeat a CT of the chest before that visit.  I don't think the problems she has on the left flank that causes her pain is related to cancer or lymphoma or hepatitis C. It is a musculoskeletal problem. It did not improve with physical therapy. I think she should continue on her pain medication and we discussed the fact that the best way to figure out if pain medication is working is if she is able to do more. If the  pain medication allows her to get back to work for example that would be a very good sign that it is working as it should.  Of course she  is also being evaluated by Dr. Graylon Good for possible hepatitis C treatment. We discussed that briefly today  I will call her with the initial lab work. Otherwise she will let me know if other issues develop before the next visit here, which likely will be in one year. Chauncey Cruel, MD    04/20/2017

## 2017-04-21 ENCOUNTER — Ambulatory Visit (INDEPENDENT_AMBULATORY_CARE_PROVIDER_SITE_OTHER): Payer: Managed Care, Other (non HMO) | Admitting: Internal Medicine

## 2017-04-21 ENCOUNTER — Encounter: Payer: Self-pay | Admitting: Internal Medicine

## 2017-04-21 VITALS — BP 128/83 | HR 94 | Temp 98.4°F | Wt 240.0 lb

## 2017-04-21 DIAGNOSIS — B182 Chronic viral hepatitis C: Secondary | ICD-10-CM | POA: Diagnosis not present

## 2017-04-21 LAB — CBC WITH DIFFERENTIAL/PLATELET
Basophils Absolute: 0 cells/uL (ref 0–200)
Basophils Relative: 0 %
Eosinophils Absolute: 330 cells/uL (ref 15–500)
Eosinophils Relative: 6 %
HCT: 37.4 % (ref 35.0–45.0)
Hemoglobin: 12.7 g/dL (ref 11.7–15.5)
Lymphocytes Relative: 12 %
Lymphs Abs: 660 cells/uL — ABNORMAL LOW (ref 850–3900)
MCH: 31.6 pg (ref 27.0–33.0)
MCHC: 34 g/dL (ref 32.0–36.0)
MCV: 93 fL (ref 80.0–100.0)
MPV: 12.3 fL (ref 7.5–12.5)
Monocytes Absolute: 605 cells/uL (ref 200–950)
Monocytes Relative: 11 %
Neutro Abs: 3905 cells/uL (ref 1500–7800)
Neutrophils Relative %: 71 %
Platelets: 169 10*3/uL (ref 140–400)
RBC: 4.02 MIL/uL (ref 3.80–5.10)
RDW: 13.9 % (ref 11.0–15.0)
WBC: 5.5 10*3/uL (ref 3.8–10.8)

## 2017-04-21 NOTE — Progress Notes (Signed)
[place] for Infectious Disease   CC: consideration for treatment for chronic hepatitis C  HPI:  +Tara [name] is a 61 y.o. female who presents for initial evaluation and management of chronic hepatitis C.  Patient tested positive over 15 years ago. Hepatitis C-associated risk factors present are: none. Patient denies IV drug abuse, renal dialysis, sexual contact with person with liver disease, tattoos. Patient has had other studies performed. Results: hepatitis C RNA by PCR, result: positive. Patient has had prior treatment for Hepatitis C. Patient does not have a past history of liver disease. Patient does not have a family history of liver disease. Patient does not  have associated signs or symptoms related to liver disease.  Labs reviewed and confirm chronic hepatitis C with a positive viral load.  She did start interferon-based treatment without ribavirin but developed TTP as a result.  She has been hesistant to undergo treatment since.      Patient does not have documented immunity to Hepatitis A. Patient does not have documented immunity to Hepatitis B.    Review of Systems:  Constitutional: negative for fatigue and malaise Gastrointestinal: negative for diarrhea Integument/breast: negative for rash All other systems reviewed and are negative       Past Medical History:  Diagnosis Date  . Anxiety   . Arthritis   . Asthma   . Blood transfusion   . Breast cancer ([place]) 2012   left breast  . Chronic edema   . Complication of anesthesia    difficulty waking up/dizzy/lightheaded  . Dysrhythmia    irregular heartbeat, takes digoxin  . Environmental allergies   . H/O varicella   . H/O varicose veins   . Hepatitis C   . History of measles, mumps, or rubella   . Hx of radiation therapy 12/15/11 - 01/29/12   left breast  . Hypothyroidism   . Shortness of breath   . Thrombocytopenia, primary ([place])   . TTP (thrombotic thrombocytopenic purpura) ([facility])     Prior to  Admission medications   Medication Sig Start Date End Date Taking? Authorizing Provider  acetaminophen (TYLENOL) 500 MG tablet Take 500 mg by mouth every 6 (six) hours as needed for mild pain, moderate pain or headache.    Yes [provider]  albuterol (PROVENTIL HFA;VENTOLIN HFA) 108 (90 BASE) MCG/ACT inhaler Inhale 2 puffs into the lungs every 6 (six) hours as needed for wheezing or shortness of breath.    Yes [provider]  budesonide (PULMICORT) 180 MCG/ACT inhaler Inhale 1 puff into the lungs daily.   Yes [provider]  cholecalciferol (VITAMIN D) 1000 UNITS tablet Take 3,000 Units by mouth daily.    Yes [provider]  cyclobenzaprine (FLEXERIL) 10 MG tablet Take 1 tablet (10 mg total) by mouth 3 (three) times daily as needed for muscle spasms. 12/02/16  Yes Baxley, Cresenciano Lick, MD  DULoxetine (CYMBALTA) 30 MG capsule Take 1 capsule (30 mg total) by mouth daily. 03/19/17  Yes Baxley, Cresenciano Lick, MD  fluticasone (FLONASE) 50 MCG/ACT nasal spray Place 2 sprays into both nostrils daily as needed for rhinitis.   Yes [provider]  glucosamine-chondroitin 500-400 MG tablet Take 1 tablet by mouth daily.    Yes [provider]  HYDROcodone-acetaminophen (NORCO/VICODIN) 5-325 MG tablet Take 1 tablet by mouth every 8 (eight) hours as needed. 01/22/17  Yes Quintella Reichert, MD  levothyroxine (SYNTHROID, LEVOTHROID) 88 MCG tablet TAKE ONE TABLET BY MOUTH DAILY 02/21/17  Yes Baxley,  Cresenciano Lick, MD  montelukast (SINGULAIR) 10 MG tablet TAKE ONE TABLET (10 MG) BY MOUTH AT BEDTIME. 03/08/17  Yes Baxley, Cresenciano Lick, MD  Multiple Vitamin (MULTIVITAMIN WITH MINERALS) TABS tablet Take 1 tablet by mouth daily.   Yes [provider]  potassium chloride SA (K-DUR,KLOR-CON) 20 MEQ tablet TAKE ONE TABLET BY MOUTH EVERY DAY 01/29/17  Yes Baxley, Cresenciano Lick, MD  torsemide (DEMADEX) 20 MG tablet TAKE ONE TABLET BY MOUTH EVERY DAY. 10/22/16  Yes Baxley, Cresenciano Lick, MD  vitamin B-12  (CYANOCOBALAMIN) 1000 MCG tablet Take 2,000 mcg by mouth daily.   Yes [provider]  digoxin (LANOXIN) 0.25 MG tablet TAKE ONE TABLET BY MOUTH DAILY. Patient not taking: Reported on 04/21/2017 12/10/16   [name], MD  letrozole [facility]) 2.5 MG tablet TAKE ONE TABLET (2.5 MG) BY MOUTH DAILY. Patient not taking: Reported on 04/21/2017 08/21/16   Magrinat, Virgie Dad, MD    Allergies  Allergen Reactions  . Aspirin Other (See Comments)    Pt has a hx of TTP.   . Nsaids Other (See Comments)    Pt has a hx of TTP.     Social History  Substance Use Topics  . Smoking status: Never Smoker  . Smokeless tobacco: Never Used  . Alcohol use 0.6 oz/week    1 Glasses of wine per week     Comment: occ    Family History  Problem Relation Age of Onset  . COPD Father   . Brain cancer Maternal Grandfather   . Alcohol abuse Maternal Grandfather   . Heart disease Neg Hx   . Hyperlipidemia Neg Hx   . Hypertension Neg Hx      Objective:  Constitutional: in no apparent distress and alert,  Vitals:   04/21/17 1027  BP: 128/83  Pulse: 94  Temp: 98.4 F (36.9 C)   Eyes: anicteric Cardiovascular: Cor RRR Respiratory: CTA B; normal respiratory effort Gastrointestinal: Bowel sounds are normal, liver is not enlarged, spleen is not enlarged Musculoskeletal: no pedal edema noted Skin: negatives: no rash; no porphyria cutanea tarda Lymphatic: no cervical lymphadenopathy   Laboratory Genotype: No results found for: HCVGENOTYPE HCV viral load:  Lab Results  Component Value Date   HCVQUANT 1,370,000 (H) 03/26/2017   Lab Results  Component Value Date   WBC 7.1 04/20/2017   HGB 12.9 04/20/2017   HCT 38.7 04/20/2017   MCV 96.0 04/20/2017   PLT 168 04/20/2017    Lab Results  Component Value Date   CREATININE 1.0 04/20/2017   BUN 19.8 04/20/2017   NA 136 04/20/2017   K 4.0 04/20/2017   CL 96 (L) 03/19/2017   CO2 28 04/20/2017    Lab Results  Component Value Date   ALT 35  04/20/2017   AST 32 04/20/2017   ALKPHOS 81 04/20/2017     Labs and history reviewed and show CHILD-PUGH A  5-6 points: Child class A 7-9 points: Child class B 10-15 points: Child class C  Lab Results  Component Value Date   INR 0.96 09/07/2013   BILITOT 0.78 04/20/2017   ALBUMIN 3.7 04/20/2017     Assessment: New Patient with Chronic Hepatitis C genotype unknown, untreated.  I discussed with the patient the lab findings that confirm chronic hepatitis C as well as the natural history and progression of disease including about 30% of people who develop cirrhosis of the liver if left untreated and once cirrhosis is established there is a 2-7% risk per year  of liver cancer and liver failure.  I discussed the importance of treatment and benefits in reducing the risk, even if significant liver fibrosis exists.   Plan: 1) Patient counseled extensively on limiting acetaminophen to no more than 2 grams daily, avoidance of alcohol. 2) Transmission discussed with patient including sexual transmission, sharing razors and toothbrush.   3) Will need referral to gastroenterology if concern for cirrhosis 4) Will need referral for substance abuse counseling: No.; Further work up to include urine drug screen  No. 5) Will prescribe appropriate medication based on genotype and coverage - private insurance 6) Hepatitis A and B titers 7) Pneumovax vaccine previously given 9) Further work up to include liver staging with elastography 10) will follow up after starting medication

## 2017-04-21 NOTE — Patient Instructions (Signed)
Date 04/21/17  Dear Mrs Lartigue, As discussed in the [facility], your hepatitis C therapy will include highly effective medication(s) for treatment and will vary based on the type of hepatitis C and insurance approval.  Potential medications include:          Harvoni (sofosbuvir 90mg /ledipasvir 400mg ) tablet oral daily          OR     Epclusa (sofosbuvir 400mg /velpatasvir 100mg ) tablet oral daily          OR      Mavyret (glecaprevir 100 mg/pibrentasvir 40 mg): Take 3 tablets oral daily          OR     Zepatier (elbasvir 50 mg/grazoprevir 100 mg) oral daily, +/- ribavirin              Medications are typically for 8 or 12 weeks total ---------------------------------------------------------------- Your HCV Treatment Start Date: You will be notified by our office once the medication is approved and where you can pick it up (or if mailed)   ---------------------------------------------------------------- [place]:   [facility] [place], [place] [address] Phone: [phone] Hours: Monday to Friday 7:30 am to 6:00 pm   Please always contact your pharmacy at least 3-4 business days before you run out of medications to ensure your next month's medication is ready or 1 week prior to running out if you receive it by mail.  Remember, each prescription is for 28 days. ---------------------------------------------------------------- GENERAL NOTES REGARDING YOUR HEPATITIS C MEDICATION:  Some medications have the following interactions:  - Acid reducing agents such as H2 blockers (ie. Pepcid (famotidine), Zantac (ranitidine), Tagamet (cimetidine), Axid (nizatidine) and proton pump inhibitors (ie. Prilosec (omeprazole), Protonix (pantoprazole), Nexium (esomeprazole), or Aciphex (rabeprazole)). Do not take until you have discussed with a health care provider.    -Antacids that contain magnesium and/or aluminum hydroxide (ie. Milk of Magensia, Rolaids,  Gaviscon, Maalox, Mylanta, an dArthritis Pain Formula).  -Calcium carbonate (calcium supplements or antacids such as Tums, Caltrate, Os-Cal).  -St. John's wort or any products that contain St. John's wort like some herbal supplements  Please inform the office prior to starting any of these medications.  - The common side effects associated with Harvoni include:      1. Fatigue      2. Headache      3. Nausea      4. Diarrhea      5. Insomnia  Please note that this only lists the most common side effects and is NOT a comprehensive list of the potential side effects of these medications. For more information, please review the drug information sheets that come with your medication package from the pharmacy.  ---------------------------------------------------------------- GENERAL HELPFUL HINTS ON HCV THERAPY: 1. Stay well-hydrated. 2. Notify the [facility] of any changes in your other over-the-counter/herbal or prescription medications. 3. If you miss a dose of your medication, take the missed dose as soon as you remember. Return to your regular time/dose schedule the next day.  4.  Do not stop taking your medications without first talking with your healthcare provider. 5.  You may take Tylenol (acetaminophen), as long as the dose is less than 2000 mg (OR no more than 4 tablets of the Tylenol Extra Strengths 500mg  tablet) in 24 hours. 6.  You will see our pharmacist-specialist within the first 2 weeks of starting your medication to monitor for any possible side effects. 7.  You will have labs once during treatment, soon  after treatment completion and one final lab 6 months after treatment completion to verify the virus is out of your system.  Scharlene Gloss, [place] for [place] [place], [place]  [phone]

## 2017-04-22 LAB — HEPATITIS B SURFACE ANTIGEN: Hepatitis B Surface Ag: NONREACTIVE

## 2017-04-22 LAB — COMPLETE METABOLIC PANEL WITH GFR
ALT: 33 U/L — ABNORMAL HIGH (ref 6–29)
AST: 31 U/L (ref 10–35)
Albumin: 4.1 g/dL (ref 3.6–5.1)
Alkaline Phosphatase: 73 U/L (ref 33–130)
BUN: 15 mg/dL (ref 7–25)
CO2: 22 mmol/L (ref 20–32)
Calcium: 8.6 mg/dL (ref 8.6–10.4)
Chloride: 100 mmol/L (ref 98–110)
Creat: 0.73 mg/dL (ref 0.50–0.99)
GFR, Est African American: >89 mL/min (ref >=60)
GFR, Est Non African American: 89 mL/min (ref >=60)
Glucose, Bld: 125 mg/dL — ABNORMAL HIGH (ref 65–99)
Potassium: 4.3 mmol/L (ref 3.5–5.3)
Sodium: 139 mmol/L (ref 135–146)
Total Bilirubin: 0.6 mg/dL (ref 0.2–1.2)
Total Protein: 6.8 g/dL (ref 6.1–8.1)

## 2017-04-22 LAB — HEPATITIS B CORE ANTIBODY, TOTAL: Hep B Core Total Ab: NONREACTIVE

## 2017-04-22 LAB — PROTIME-INR
INR: 0.9
Prothrombin Time: 9.9 s (ref 9.0–11.5)

## 2017-04-22 LAB — HEPATITIS A ANTIBODY, TOTAL: Hep A Total Ab: NONREACTIVE

## 2017-04-22 LAB — HEPATITIS B SURFACE ANTIBODY,QUALITATIVE: Hep B S Ab: NONREACTIVE

## 2017-04-22 LAB — HIV ANTIBODY (ROUTINE TESTING W REFLEX): HIV 1&2 Ab, 4th Generation: NONREACTIVE

## 2017-04-24 LAB — LIVER FIBROSIS, FIBROTEST-ACTITEST
ALT: 32 U/L — ABNORMAL HIGH (ref 6–29)
Alpha-2-Macroglobulin: 418 mg/dL — ABNORMAL HIGH (ref 106–279)
Apolipoprotein A1: 166 mg/dL (ref 101–198)
Bilirubin: 0.5 mg/dL (ref 0.2–1.2)
Fibrosis Score: 0.64
GGT: 90 U/L — ABNORMAL HIGH (ref 3–65)
Haptoglobin: 109 mg/dL (ref 43–212)
Necroinflammat ACT Score: 0.24
Reference ID: 2080847

## 2017-04-25 LAB — HEPATITIS C GENOTYPE

## 2017-04-26 ENCOUNTER — Other Ambulatory Visit: Payer: Self-pay | Admitting: Internal Medicine

## 2017-04-26 ENCOUNTER — Encounter: Payer: Self-pay | Admitting: Internal Medicine

## 2017-04-26 MED ORDER — LEDIPASVIR-SOFOSBUVIR 90-400 MG PO TABS: 1.0000 | ORAL_TABLET | Freq: Every day | ORAL | 2 refills | Status: DC

## 2017-04-27 ENCOUNTER — Other Ambulatory Visit: Payer: Self-pay | Admitting: Oncology

## 2017-04-28 ENCOUNTER — Ambulatory Visit (HOSPITAL_COMMUNITY)
Admission: RE | Admit: 2017-04-28 | Discharge: 2017-04-28 | Disposition: A | Discharge status: Home / Self Care | Payer: Managed Care, Other (non HMO) | Source: Ambulatory Visit | Attending: Internal Medicine | Admitting: Internal Medicine

## 2017-04-28 DIAGNOSIS — R160 Hepatomegaly, not elsewhere classified: Secondary | ICD-10-CM | POA: Insufficient documentation

## 2017-04-28 DIAGNOSIS — B182 Chronic viral hepatitis C: Secondary | ICD-10-CM

## 2017-05-09 ENCOUNTER — Encounter: Payer: Self-pay | Admitting: Internal Medicine

## 2017-05-10 ENCOUNTER — Other Ambulatory Visit: Payer: Self-pay | Admitting: Internal Medicine

## 2017-05-10 ENCOUNTER — Other Ambulatory Visit: Payer: Self-pay | Admitting: Pharmacist

## 2017-05-10 ENCOUNTER — Telehealth: Payer: Self-pay | Admitting: *Deleted

## 2017-05-10 DIAGNOSIS — K769 Liver disease, unspecified: Secondary | ICD-10-CM

## 2017-05-10 DIAGNOSIS — K7460 Unspecified cirrhosis of liver: Secondary | ICD-10-CM | POA: Insufficient documentation

## 2017-05-10 DIAGNOSIS — K717 Toxic liver disease with fibrosis and cirrhosis of liver: Secondary | ICD-10-CM

## 2017-05-10 HISTORY — DX: Unspecified cirrhosis of liver: K74.60

## 2017-05-10 NOTE — Telephone Encounter (Signed)
Patient calling for results of ultrasound; please advise. Myrtis Hopping CMA

## 2017-05-10 NOTE — Telephone Encounter (Signed)
Patient notified and given appointment for MRI at [facility] on 05/14/17 at 8:45 AM. Nothing to eat or drink after midnight.

## 2017-05-10 NOTE — Telephone Encounter (Signed)
It does show a lot of fibrosis, though no cirrhosis yet so as long as we get this treated, should be ok, should not pose any long term issue.  It does suggest an MRI to better characterize so we can schedule that.  This will be further discussed with her at her next appt.   thanks

## 2017-05-14 ENCOUNTER — Ambulatory Visit (HOSPITAL_COMMUNITY): Payer: Managed Care, Other (non HMO)

## 2017-05-14 MED FILL — HARVONI 90-400 MG TABLET: 90-400 | 28 days supply | Qty: 28 | Fill #0

## 2017-05-19 ENCOUNTER — Other Ambulatory Visit: Payer: Self-pay | Admitting: Pharmacist

## 2017-05-19 ENCOUNTER — Other Ambulatory Visit: Payer: Self-pay | Admitting: Internal Medicine

## 2017-05-21 ENCOUNTER — Ambulatory Visit (HOSPITAL_COMMUNITY)
Admission: RE | Admit: 2017-05-21 | Discharge: 2017-05-21 | Disposition: A | Discharge status: Home / Self Care | Payer: Managed Care, Other (non HMO) | Source: Ambulatory Visit | Attending: Internal Medicine | Admitting: Internal Medicine

## 2017-05-21 DIAGNOSIS — N289 Disorder of kidney and ureter, unspecified: Secondary | ICD-10-CM | POA: Insufficient documentation

## 2017-05-21 DIAGNOSIS — K769 Liver disease, unspecified: Secondary | ICD-10-CM

## 2017-05-21 DIAGNOSIS — K760 Fatty (change of) liver, not elsewhere classified: Secondary | ICD-10-CM | POA: Insufficient documentation

## 2017-05-21 DIAGNOSIS — R590 Localized enlarged lymph nodes: Secondary | ICD-10-CM | POA: Diagnosis not present

## 2017-05-21 MED ORDER — GADOXETATE DISODIUM 0.25 MMOL/ML IV SOLN
10.0000 mL | Freq: Once | INTRAVENOUS | Status: AC | PRN
  Administered 2017-05-21: 10 mL via INTRAVENOUS

## 2017-05-24 ENCOUNTER — Telehealth: Payer: Self-pay | Admitting: *Deleted

## 2017-05-24 NOTE — Telephone Encounter (Signed)
RN called patient, left message with brief results, asked her to call back for further instructions. Landis Gandy, RN   Comer, Okey Regal, MD  P [facility] Triage Nurse Pool        Please let her know her MRI looked fine, though it was not a complete study since she apparently was unable to remain still so have recommended a repeat study in 6 months if she can tolerate it.  thanks

## 2017-05-25 NOTE — Telephone Encounter (Signed)
Patient notified and she said she did not know if she would be able to afford another MRI. Tara [name]

## 2017-05-30 ENCOUNTER — Other Ambulatory Visit: Payer: Self-pay | Admitting: Internal Medicine

## 2017-07-10 ENCOUNTER — Other Ambulatory Visit: Payer: Self-pay | Admitting: Internal Medicine

## 2017-07-11 NOTE — Telephone Encounter (Signed)
Refill both x one year

## 2017-07-12 ENCOUNTER — Other Ambulatory Visit: Payer: Self-pay | Admitting: Internal Medicine

## 2017-07-27 ENCOUNTER — Encounter: Payer: Self-pay | Admitting: Oncology

## 2017-07-30 ENCOUNTER — Encounter: Payer: Self-pay | Admitting: Internal Medicine

## 2017-07-30 ENCOUNTER — Other Ambulatory Visit: Payer: Self-pay

## 2017-07-30 MED ORDER — BUDESONIDE 180 MCG/ACT IN AEPB: 1.0000 | INHALATION_SPRAY | Freq: Every day | RESPIRATORY_TRACT | 3 refills | Status: DC

## 2017-08-13 ENCOUNTER — Other Ambulatory Visit: Payer: Self-pay | Admitting: Internal Medicine

## 2017-08-13 NOTE — Telephone Encounter (Signed)
Past due for CPE. Please book before refilling. Last November 2017

## 2017-08-16 NOTE — Telephone Encounter (Signed)
Scheduled in March due to insurance change.

## 2017-08-30 ENCOUNTER — Other Ambulatory Visit: Payer: Self-pay | Admitting: Internal Medicine

## 2017-09-07 ENCOUNTER — Telehealth: Payer: Self-pay | Admitting: Pharmacist

## 2017-09-07 ENCOUNTER — Other Ambulatory Visit: Payer: Self-pay | Admitting: Pharmacist

## 2017-09-07 DIAGNOSIS — B182 Chronic viral hepatitis C: Secondary | ICD-10-CM

## 2017-09-07 MED ORDER — LEDIPASVIR-SOFOSBUVIR 90-400 MG PO TABS: 1.0000 | ORAL_TABLET | Freq: Every day | ORAL | 2 refills | Status: DC

## 2017-09-07 NOTE — Telephone Encounter (Signed)
Alliya was finally approved for Harvoni x 12 wks through her insurance.  Spent some time going over the medication and answered her questions. Counseled her on how to take it and not to miss any doses. Harvoni has a slight interaction with digoxin that it could increase digoxin levels.  Standard doses appropriate.

## 2017-09-16 ENCOUNTER — Other Ambulatory Visit: Payer: Self-pay | Admitting: Internal Medicine

## 2017-09-16 DIAGNOSIS — Z0000 Encounter for general adult medical examination without abnormal findings: Secondary | ICD-10-CM

## 2017-09-16 DIAGNOSIS — Z1321 Encounter for screening for nutritional disorder: Secondary | ICD-10-CM

## 2017-09-16 DIAGNOSIS — Z13220 Encounter for screening for lipoid disorders: Secondary | ICD-10-CM

## 2017-09-16 DIAGNOSIS — E039 Hypothyroidism, unspecified: Secondary | ICD-10-CM

## 2017-09-27 ENCOUNTER — Telehealth: Payer: Self-pay | Admitting: Pharmacist Clinician (PhC)/ Clinical Pharmacy Specialist

## 2017-09-27 NOTE — Telephone Encounter (Signed)
Counseled on timing of taking Harvoni and PRN antacids.

## 2017-09-29 ENCOUNTER — Encounter: Payer: Self-pay | Admitting: Pharmacy Technician

## 2017-10-13 ENCOUNTER — Ambulatory Visit (INDEPENDENT_AMBULATORY_CARE_PROVIDER_SITE_OTHER): Payer: BLUE CROSS/BLUE SHIELD | Admitting: Pharmacist

## 2017-10-13 DIAGNOSIS — B182 Chronic viral hepatitis C: Secondary | ICD-10-CM

## 2017-10-13 NOTE — Progress Notes (Signed)
HPI: Tara [name] is a 62 y.o. female who presents for f/u appt for treatment of HepC with Harvoni x12 weeks (started 09/29/17).  Lab Results  Component Value Date   HCVGENOTYPE 1a 04/21/2017    Allergies: Allergies  Allergen Reactions  . Aspirin Other (See Comments)    Pt has a hx of TTP.   . Nsaids Other (See Comments)    Pt has a hx of TTP.    Past Medical History: Past Medical History:  Diagnosis Date  . Anxiety   . Arthritis   . Asthma   . Blood transfusion   . Breast cancer ([place]) 2012   left breast  . Chronic edema   . Complication of anesthesia    difficulty waking up/dizzy/lightheaded  . Dysrhythmia    irregular heartbeat, takes digoxin  . Environmental allergies   . H/O varicella   . H/O varicose veins   . Hepatitis C   . History of measles, mumps, or rubella   . Hx of radiation therapy 12/15/11 - 01/29/12   left breast  . Hypothyroidism   . Shortness of breath   . Thrombocytopenia, primary ([place])   . TTP (thrombotic thrombocytopenic purpura) ([place])     Social History: Social History   Socioeconomic History  . Marital status: Married    Spouse name: Not on file  . Number of children: Not on file  . Years of education: Not on file  . Highest education level: Not on file  Social Needs  . Financial resource strain: Not on file  . Food insecurity - worry: Not on file  . Food insecurity - inability: Not on file  . Transportation needs - medical: Not on file  . Transportation needs - non-medical: Not on file  Occupational History  . Not on file  Tobacco Use  . Smoking status: Never Smoker  . Smokeless tobacco: Never Used  Substance and Sexual Activity  . Alcohol use: Yes    Alcohol/week: 0.6 oz    Types: 1 Glasses of wine per week    Comment: occ  . Drug use: No  . Sexual activity: Not on file  Other Topics Concern  . Not on file  Social History Narrative  . Not on file    Labs: Hep B S Ab (no units)  Date Value  04/21/2017 NON-REACTIVE    Hepatitis B Surface Ag (no units)  Date Value  04/21/2017 NON-REACTIVE   HCV Ab (no units)  Date Value  03/26/2017 REACTIVE (A)    Lab Results  Component Value Date   HCVGENOTYPE 1a 04/21/2017    Hepatitis C RNA quantitative Latest Ref Rng & Units 03/26/2017 04/12/2013  HCV Quantitative NOT DETECTED IU/mL 1,370,000(H) 374,472(H)  HCV Quantitative Log NOT DETECTED Log IU/mL 6.14(H) 5.57(H)    AST (U/L)  Date Value  04/21/2017 31  04/20/2017 32  08/13/2016 25  06/29/2016 22  03/29/2016 42 (H)  08/12/2015 29   ALT (U/L)  Date Value  04/21/2017 33 (H)  04/21/2017 32 (H)  04/20/2017 35  08/13/2016 31  06/29/2016 22  03/29/2016 36  08/12/2015 32   INR (no units)  Date Value  04/21/2017 0.9  09/07/2013 0.96    CrCl: CrCl cannot be calculated (Patient's most recent lab result is older than the maximum 21 days allowed.).  Fibrosis Score: 3/4 as assessed by [name]  Child-Pugh Score: A  Previous Treatment Regimen: interferon withOUT ribavirin  Assessment: Tara [name] tested positive for HepC over 15 years ago, with no hepC-associated  risk factors. She discontinued interferon-based treatment in the past due to TPP (plt ~20k). She recently started Harvoni (09/29/17) and reports she is tolerating with no side effects except fatigue. She reports sleeping approximately 12 hours at night and naps during the day. Tara [name] expressed concern over a recent episode of vomiting/diarrhea x2.5 hours but this is believed to be from an acute viral infection. She reports no other episodes of this kind. She also expressed concern over a tongue ulcer x3 weeks. Pharmacist instructed Tara [name] to call pharmacy to refill Harvoni when she has 7 pills left (in approximately 1 week).   Since Tara [name] is from [facility], we will coordinate her 4-week labs when she returns to [place] to get labs for her general wellness visit on Feb 21th with Dr. Tedra Senegal. Dr. Verlene Mayer office has been contacted and agrees to draw HepC  viral load.  Tara [name] met with Tara [name] for clarification on issues with co-pay assistance. Tara [name] will f/u with pharmacy to ensure they use her Medicare co-pay assistance instead of commercial insurance co-pay card for the second fill.   Recommendations: -Continue Harvoni x12 weeks (stop date 12/22/17) -Obtain VL with general wellness lab visit on 10/21/17 with Dr. Renold Genta.  Alfredo Martinez, [place].D. [place] [facility] 10/13/2017, 11:41 AM

## 2017-10-21 ENCOUNTER — Other Ambulatory Visit: Payer: BLUE CROSS/BLUE SHIELD | Admitting: Internal Medicine

## 2017-10-21 ENCOUNTER — Other Ambulatory Visit: Payer: Self-pay | Admitting: Pharmacist

## 2017-10-21 DIAGNOSIS — Z1321 Encounter for screening for nutritional disorder: Secondary | ICD-10-CM

## 2017-10-21 DIAGNOSIS — B182 Chronic viral hepatitis C: Secondary | ICD-10-CM

## 2017-10-21 DIAGNOSIS — Z13220 Encounter for screening for lipoid disorders: Secondary | ICD-10-CM

## 2017-10-21 DIAGNOSIS — Z0000 Encounter for general adult medical examination without abnormal findings: Secondary | ICD-10-CM

## 2017-10-21 DIAGNOSIS — E039 Hypothyroidism, unspecified: Secondary | ICD-10-CM

## 2017-10-21 MED ORDER — LEDIPASVIR-SOFOSBUVIR 90-400 MG PO TABS: 1.0000 | ORAL_TABLET | Freq: Every day | ORAL | 1 refills | Status: DC

## 2017-10-21 NOTE — Addendum Note (Signed)
Addended by: Mady Haagensen on: 10/21/2017 09:16 AM   Modules accepted: Orders

## 2017-10-21 NOTE — Addendum Note (Signed)
Addended by: Mady Haagensen on: 10/21/2017 12:47 PM   Modules accepted: Orders

## 2017-10-22 LAB — COMPLETE METABOLIC PANEL WITH GFR
AG Ratio: 1.3 (calc) (ref 1.0–2.5)
ALT: 19 U/L (ref 6–29)
AST: 19 U/L (ref 10–35)
Albumin: 4 g/dL (ref 3.6–5.1)
Alkaline phosphatase (APISO): 52 U/L (ref 33–130)
BUN: 12 mg/dL (ref 7–25)
CO2: 32 mmol/L (ref 20–32)
Calcium: 8.8 mg/dL (ref 8.6–10.4)
Chloride: 99 mmol/L (ref 98–110)
Creat: 0.88 mg/dL (ref 0.50–0.99)
GFR, Est African American: 81 mL/min/{1.73_m2} (ref >=60)
GFR, Est Non African American: 70 mL/min/{1.73_m2} (ref >=60)
Globulin: 3.1 g/dL (calc) (ref 1.9–3.7)
Glucose, Bld: 144 mg/dL — ABNORMAL HIGH (ref 65–99)
Potassium: 4.4 mmol/L (ref 3.5–5.3)
Sodium: 140 mmol/L (ref 135–146)
Total Bilirubin: 0.8 mg/dL (ref 0.2–1.2)
Total Protein: 7.1 g/dL (ref 6.1–8.1)

## 2017-10-22 LAB — CBC WITH DIFFERENTIAL/PLATELET
Basophils Absolute: 40 cells/uL (ref 0–200)
Basophils Relative: 0.8 %
Eosinophils Absolute: 315 cells/uL (ref 15–500)
Eosinophils Relative: 6.3 %
HCT: 38.1 % (ref 35.0–45.0)
Hemoglobin: 13.3 g/dL (ref 11.7–15.5)
Lymphs Abs: 755 cells/uL — ABNORMAL LOW (ref 850–3900)
MCH: 31.5 pg (ref 27.0–33.0)
MCHC: 34.9 g/dL (ref 32.0–36.0)
MCV: 90.3 fL (ref 80.0–100.0)
MPV: 12.5 fL (ref 7.5–12.5)
Monocytes Relative: 10.5 %
Neutro Abs: 3365 cells/uL (ref 1500–7800)
Neutrophils Relative %: 67.3 %
Platelets: 155 10*3/uL (ref 140–400)
RBC: 4.22 10*6/uL (ref 3.80–5.10)
RDW: 13 % (ref 11.0–15.0)
Total Lymphocyte: 15.1 %
WBC mixed population: 525 cells/uL (ref 200–950)
WBC: 5 10*3/uL (ref 3.8–10.8)

## 2017-10-22 LAB — VITAMIN D 25 HYDROXY (VIT D DEFICIENCY, FRACTURES): Vit D, 25-Hydroxy: 31 ng/mL (ref 30–100)

## 2017-10-22 LAB — LIPID PANEL
Cholesterol: 215 mg/dL — ABNORMAL HIGH (ref <200)
HDL: 43 mg/dL — ABNORMAL LOW (ref >50)
LDL Cholesterol (Calc): 136 mg/dL (calc) — ABNORMAL HIGH
Non-HDL Cholesterol (Calc): 172 mg/dL (calc) — ABNORMAL HIGH (ref <130)
Total CHOL/HDL Ratio: 5 (calc) — ABNORMAL HIGH (ref <5.0)
Triglycerides: 225 mg/dL — ABNORMAL HIGH (ref <150)

## 2017-10-22 LAB — TSH: TSH: 1.53 mIU/L (ref 0.40–4.50)

## 2017-10-24 LAB — HEPATITIS C RNA QUANTITATIVE
HCV Quantitative Log: <1.18 Log IU/mL — AB
HCV RNA, PCR, QN: <15 IU/mL — AB

## 2017-10-25 ENCOUNTER — Encounter: Payer: Self-pay | Admitting: Internal Medicine

## 2017-10-25 ENCOUNTER — Ambulatory Visit (INDEPENDENT_AMBULATORY_CARE_PROVIDER_SITE_OTHER): Payer: BLUE CROSS/BLUE SHIELD | Admitting: Internal Medicine

## 2017-10-25 VITALS — BP 120/88 | HR 88 | Ht 66.75 in | Wt 237.0 lb

## 2017-10-25 DIAGNOSIS — J302 Other seasonal allergic rhinitis: Secondary | ICD-10-CM

## 2017-10-25 DIAGNOSIS — Z862 Personal history of diseases of the blood and blood-forming organs and certain disorders involving the immune mechanism: Secondary | ICD-10-CM | POA: Diagnosis not present

## 2017-10-25 DIAGNOSIS — Z6837 Body mass index (BMI) 37.0-37.9, adult: Secondary | ICD-10-CM | POA: Diagnosis not present

## 2017-10-25 DIAGNOSIS — R7302 Impaired glucose tolerance (oral): Secondary | ICD-10-CM

## 2017-10-25 DIAGNOSIS — Z96643 Presence of artificial hip joint, bilateral: Secondary | ICD-10-CM

## 2017-10-25 DIAGNOSIS — J4520 Mild intermittent asthma, uncomplicated: Secondary | ICD-10-CM | POA: Diagnosis not present

## 2017-10-25 DIAGNOSIS — Z8619 Personal history of other infectious and parasitic diseases: Secondary | ICD-10-CM | POA: Diagnosis not present

## 2017-10-25 DIAGNOSIS — Z1211 Encounter for screening for malignant neoplasm of colon: Secondary | ICD-10-CM | POA: Diagnosis not present

## 2017-10-25 DIAGNOSIS — F329 Major depressive disorder, single episode, unspecified: Secondary | ICD-10-CM

## 2017-10-25 DIAGNOSIS — F32A Depression, unspecified: Secondary | ICD-10-CM

## 2017-10-25 DIAGNOSIS — Z0000 Encounter for general adult medical examination without abnormal findings: Secondary | ICD-10-CM | POA: Diagnosis not present

## 2017-10-25 DIAGNOSIS — R739 Hyperglycemia, unspecified: Secondary | ICD-10-CM

## 2017-10-25 DIAGNOSIS — F419 Anxiety disorder, unspecified: Secondary | ICD-10-CM

## 2017-10-25 DIAGNOSIS — Z853 Personal history of malignant neoplasm of breast: Secondary | ICD-10-CM | POA: Diagnosis not present

## 2017-10-25 DIAGNOSIS — R8290 Unspecified abnormal findings in urine: Secondary | ICD-10-CM | POA: Diagnosis not present

## 2017-10-25 DIAGNOSIS — R609 Edema, unspecified: Secondary | ICD-10-CM

## 2017-10-25 DIAGNOSIS — E039 Hypothyroidism, unspecified: Secondary | ICD-10-CM | POA: Diagnosis not present

## 2017-10-25 LAB — POCT URINALYSIS DIPSTICK
Appearance: ABNORMAL
Bilirubin, UA: NEGATIVE
Blood, UA: NEGATIVE
Glucose, UA: NEGATIVE
Ketones, UA: NEGATIVE
Nitrite, UA: NEGATIVE
Odor: ABNORMAL
Protein, UA: NEGATIVE
Spec Grav, UA: 1.02 (ref 1.010–1.025)
Urobilinogen, UA: 0.2 E.U./dL
pH, UA: 6 (ref 5.0–8.0)

## 2017-10-25 MED ORDER — FLUTICASONE-SALMETEROL 250-50 MCG/DOSE IN AEPB: 1.0000 | INHALATION_SPRAY | Freq: Two times a day (BID) | RESPIRATORY_TRACT | 3 refills | Status: DC

## 2017-10-25 NOTE — Progress Notes (Signed)
Subjective:    Patient ID: Tara [name], female    DOB: 1956/02/21, 62 y.o.   MRN: [id number]  HPI  62 year old Female for health maintenance exam and evaluation of medical issues. Is anxious about her health.   Weight has increased to 237 from 230.5 pounds. Going to [facility] twice a week.  Says she is sleepy from [facility] - has to take it 2 more months. Hx chronic Hep C. Dr Olevia Perches recognized this years ago but pt did not want treatment at the time. Pt relates it to a blood transfusion for TTP while living in [place].  History of breast cancer followed by Dr. Jana Hakim.   Wants Advair inhaler so have prescribed Advair 250/50 bid. Not watching diet as much. C/o voice hoarseness. Could be reflux or allergies.  Liver functions are OK.Colonoscopy out of state 2007 had tubular adenoma. Says Dr. Olevia Perches did colonoscopy but note 2014 says sigmoidoscopy was planned. Report found August 2014. Had sigmoidoscopy at that time. Has not had full colonoscopy since moving here 2012. Pt  needs referral for this.  Says she burps a lot sleeping on her abdomen. Likely has GE reflux.  She has a history of obesity, hypothyroidism, allergic rhinitis, asthma, status post bilateral hip replacements.  She had right hip replaced 2005 and left in 2007.  History of TTP.  History of depression treated with Celexa but she has discontinued this.  Got to take it and weaned off of it.  History of dependent edema.  History of eczema left foot.  With regard to TTP, she was 62 years of age when she developed it.  She did not have a splenectomy.  She had a recurrence at age 89 while being treated with interferon for hepatitis C.  History of left arm lymphedema.  History of fractured left elbow with pins placed.  Left foot reconstruction 2008.  Dr. Jana Hakim referred her to me in 2013 for primary care.  She found a mass in her left breast while residing in [place] in 2012.  Mammogram showed suspicious abnormality in the left breast  measuring about 10 mm in physical exam showed a 1 cm firm superficial mass in the axillary region of the breast.  Biopsy was performed October 12 at [facility].  She had invasive ductal carcinoma grade 3, estrogen receptor positive, progesterone receptor positive.  Tumor was HER-2 positive with a ratio of 2.5.  Subsequently had definitive left lumpectomy and sentinel node sampling December 2012.  2 sentinel nodes were negative for tumor.  Decision was made to proceed with 4 cycles of adjuvant chemotherapy.  Chemotherapy was started in [place] in 2013 after she moved here from [place].  She subsequently developed cellulitis in her left breast and was treated with Keflex.  She has done well with no significant peripheral neuropathy.  She also received radiation therapy here.  Social history: She does not smoke.  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  Husband is a Dance movement psychotherapist.  She drinks a glass of wine twice weekly.  History of impaired glucose tolerance.  Has been referred to diabetes treatment Center for counseling in the past.  In 2017 hemoglobin A1c was 5.6% in 2016 6.2%.  She was admitted to the hospital July 2017 with hypokalemia thought to be secondary to diarrhea.  She had gastroenteritis presumably viral with more than 10 bowel movements daily.  Was given potassium repletion and improved with IV  fluids.  She subsequently had a fall at home when she was sick with gastroenteritis.  She suffered a nondisplaced right posterior lateral eighth rib fracture.  Had left lower lobe pneumonia January 2017 treated as an outpatient.  In December 2016 she had CT of the chest with contrast for complaint of back and chest pain for 18 months.  Study showed no evidence of metastatic disease or acute findings.  Family history: Mother with history of skin cancer but patient not sure what type.  Father died of COPD complications.  All of  her sister and younger brother healthy.  No new changes in family history according to patient.  No history of breast or ovarian cancer in the immediate family.         Review of Systems  Constitutional: Positive for fatigue.  Respiratory:       Some shortness of breath with minimal exertion likely due to deconditioning rather than asthma  Cardiovascular: Positive for leg swelling.  Genitourinary: Negative.   Musculoskeletal: Positive for arthralgias.  Neurological: Negative.   Psychiatric/Behavioral:       Anxious       Objective:   Physical Exam  Genitourinary:  Genitourinary Comments: Pap taken in 2017.  Bimanual normal.   Skin warm and dry.  Nodes none.  TMs and pharynx are clear.  Neck is supple without JVD thyromegaly or carotid bruits.  Chest clear to auscultation.  Cardiac exam regular rate and rhythm normal S1 and.  Abdomen no hepatosplenomegaly masses or tenderness.  Breasts normal female without masses.  Extremities has trace lower extremity edema.  Numerous superficial varicosities and feet particularly the right foot.  Area of what appears to be psoriasis or contact dermatitis top of left foot.       Assessment & Plan:  History of TTP  History of breast cancer  Obesity  Impaired glucose tolerance  History of hepatitis C treated with Harvoni  Anxiety  Hypothyroidism  History of asthma  History of allergic rhinitis  Status post bilateral hip replacements  History of cardiac dysrhythmia treated with digoxin  Dependent edema  Plan: Check hemoglobin A1c.  It is important patient take diet exercise and weight loss seriously.  Complaining of fatigue and sleepiness with Harvoni.  Does not want to see a dietitian.  She had sigmoidoscopy in 2008 and needs to have full colonoscopy.  It has been 10 years since that procedure.  She was advised to call up our GI.  I prescribed Advair 250/50 for asthma at her request.

## 2017-10-25 NOTE — Patient Instructions (Signed)
Please try to diet exercise and lose some weight.  Follow-up in 6 months.  Hemoglobin A1c pending.

## 2017-10-26 ENCOUNTER — Other Ambulatory Visit: Payer: Self-pay

## 2017-10-26 ENCOUNTER — Encounter: Payer: Self-pay | Admitting: Internal Medicine

## 2017-10-26 DIAGNOSIS — R7302 Impaired glucose tolerance (oral): Secondary | ICD-10-CM | POA: Insufficient documentation

## 2017-10-26 LAB — URINE CULTURE
MICRO NUMBER:: 90243464
SPECIMEN QUALITY:: ADEQUATE

## 2017-10-26 LAB — HEMOGLOBIN A1C
Hgb A1c MFr Bld: 6.3 % of total Hgb — ABNORMAL HIGH (ref <5.7)
Mean Plasma Glucose: 134 (calc)
eAG (mmol/L): 7.4 (calc)

## 2017-10-26 MED ORDER — METFORMIN HCL 500 MG PO TABS: 500.0000 mg | ORAL_TABLET | Freq: Two times a day (BID) | ORAL | 0 refills | Status: DC

## 2017-11-01 ENCOUNTER — Telehealth: Payer: Self-pay | Admitting: Pharmacist

## 2017-11-01 NOTE — Telephone Encounter (Signed)
Recie called with questions regarding drug interactions with her Harvoni.  She has a bad cold and wanted to know if Delsym, Benadryl, Claritin, Robitussin, or Mucinex interacted with Harvoni.  I told there those should be fine to take with her Harvoni and that I hoped she felt better soon.

## 2017-11-02 ENCOUNTER — Other Ambulatory Visit: Payer: Self-pay | Admitting: Internal Medicine

## 2017-11-04 ENCOUNTER — Encounter: Payer: Self-pay | Admitting: Internal Medicine

## 2017-11-05 ENCOUNTER — Ambulatory Visit (INDEPENDENT_AMBULATORY_CARE_PROVIDER_SITE_OTHER): Payer: BLUE CROSS/BLUE SHIELD | Admitting: Internal Medicine

## 2017-11-05 ENCOUNTER — Encounter: Payer: Self-pay | Admitting: Internal Medicine

## 2017-11-05 VITALS — BP 100/70 | HR 93 | Temp 98.0°F | Ht 67.0 in | Wt 247.0 lb

## 2017-11-05 DIAGNOSIS — J40 Bronchitis, not specified as acute or chronic: Secondary | ICD-10-CM | POA: Diagnosis not present

## 2017-11-05 DIAGNOSIS — J22 Unspecified acute lower respiratory infection: Secondary | ICD-10-CM

## 2017-11-05 DIAGNOSIS — J040 Acute laryngitis: Secondary | ICD-10-CM | POA: Diagnosis not present

## 2017-11-05 MED ORDER — LEVOFLOXACIN 500 MG PO TABS: 500.0000 mg | ORAL_TABLET | Freq: Every day | ORAL | 0 refills | Status: DC

## 2017-11-15 ENCOUNTER — Other Ambulatory Visit: Payer: Self-pay | Admitting: Internal Medicine

## 2017-11-15 ENCOUNTER — Telehealth: Payer: Self-pay | Admitting: Internal Medicine

## 2017-11-15 MED ORDER — POTASSIUM CHLORIDE CRYS ER 20 MEQ PO TBCR: 20.0000 meq | EXTENDED_RELEASE_TABLET | Freq: Every day | ORAL | 3 refills | Status: DC

## 2017-11-15 NOTE — Telephone Encounter (Signed)
Patient is wanting to get a new Rx for Potassium Chloride SA 20 meQ.  She is switching pharmacies to [facility].  She thought maybe she could get this cheaper at [facility].  So, she needs a new Rx sent.    Pharmacy:  [facility] in [place]  Phone:  [phone]

## 2017-11-15 NOTE — Telephone Encounter (Signed)
Please refill x one year to new pharmacy

## 2017-11-22 NOTE — Progress Notes (Signed)
   Subjective:    Patient ID: Tara [name], female    DOB: 04-04-1956, 62 y.o.   MRN: [id number]  HPI Patient complaining of maxillary sinus pressure and congestion for several days.  He is always very anxious about her health.  No fever or shaking chills.  No flulike symptoms.  Not much cough.   Review of Systems hoarse when she speaks     Objective:   Physical Exam Skin warm and dry.  Pharynx very slightly injected.  Sounds a bit nasally congested when she speaks.  TMs are clear.  Neck supple without adenopathy.  Chest clear to auscultation.       Assessment & Plan:  Acute maxillary sinusitis  Plan: Levaquin 500 mg daily for 7 days

## 2017-11-25 ENCOUNTER — Other Ambulatory Visit: Payer: Self-pay | Admitting: Internal Medicine

## 2017-11-27 NOTE — Patient Instructions (Signed)
Levaquin 500mg daily for 7 days.

## 2017-12-03 ENCOUNTER — Other Ambulatory Visit: Payer: Self-pay | Admitting: Internal Medicine

## 2017-12-15 ENCOUNTER — Telehealth: Payer: Self-pay | Admitting: Internal Medicine

## 2017-12-15 NOTE — Telephone Encounter (Signed)
Talked with Dr Renold Genta she advise to schedule patient for in the morning.* Called patient scheduled her for 11:15am

## 2017-12-15 NOTE — Telephone Encounter (Signed)
Tara [name] Self [phone]  [name] called to say she has been coughing since last Tuesday, she also feels like her head is congested, and ears bothering her, she also has runny nose but it is not yellow. She wanted to see if we could call in something for her or does she need to be seen.

## 2017-12-16 ENCOUNTER — Ambulatory Visit
Admission: RE | Admit: 2017-12-16 | Discharge: 2017-12-16 | Disposition: A | Discharge status: Home / Self Care | Payer: BLUE CROSS/BLUE SHIELD | Source: Ambulatory Visit | Attending: Internal Medicine | Admitting: Internal Medicine

## 2017-12-16 ENCOUNTER — Ambulatory Visit: Payer: BLUE CROSS/BLUE SHIELD | Admitting: Internal Medicine

## 2017-12-16 ENCOUNTER — Encounter: Payer: Self-pay | Admitting: Internal Medicine

## 2017-12-16 VITALS — BP 122/68 | HR 88 | Temp 98.0°F | Ht 67.0 in | Wt 234.0 lb

## 2017-12-16 DIAGNOSIS — J45901 Unspecified asthma with (acute) exacerbation: Secondary | ICD-10-CM | POA: Diagnosis not present

## 2017-12-16 DIAGNOSIS — R059 Cough, unspecified: Secondary | ICD-10-CM

## 2017-12-16 DIAGNOSIS — R05 Cough: Secondary | ICD-10-CM | POA: Diagnosis not present

## 2017-12-16 MED ORDER — PREDNISONE 10 MG PO TABS: ORAL_TABLET | ORAL | 0 refills | Status: DC

## 2017-12-16 MED ORDER — HYDROCODONE-HOMATROPINE 5-1.5 MG/5ML PO SYRP: 5.0000 mL | ORAL_SOLUTION | Freq: Three times a day (TID) | ORAL | 0 refills | Status: DC | PRN

## 2017-12-16 MED ORDER — LEVOFLOXACIN 500 MG PO TABS: 500.0000 mg | ORAL_TABLET | Freq: Every day | ORAL | 0 refills | Status: DC

## 2017-12-16 NOTE — Patient Instructions (Addendum)
Levaquin 500 milligrams daily for 10 days.  Hycodan 1 teaspoon p.o. every 8 hours as needed cough.  Tapering course of prednisone going from 60 mg to 0 mg over 7 days.  Have chest x-ray  Addendum: Chest x-ray is negative

## 2017-12-16 NOTE — Progress Notes (Signed)
   Subjective:    Patient ID: Tara [name], female    DOB: 10-18-1955, 62 y.o.   MRN: [id number]  HPI 62 year old Female in today with URI symptoms and had onset around April 9.  Has cough and congestion.  No fever or shaking chills.  Has been using inhaler.  Has deep congested cough but not much sputum production.  Mother died recently of complications of pneumonia at age 22  Review of Systems see above     Objective:   Physical Exam Coughing a lot in the office.  Skin warm and dry.  TMs and pharynx are clear.  Neck is supple without adenopathy.  Chest occasional inspiratory wheezing.  No rales appreciated.       Assessment & Plan:  Asthmatic bronchitis  Plan: Prednisone 10 mg (#21) going from 60 mg to 0 mg over 7 days.  Levaquin 500 mg daily for 10 days.  Continue Singulair.  Chest x-ray.  Hycodan 1 teaspoon p.o. every 8 hours as needed cough.  Addendum: Chest x-ray is negative and patient notified

## 2017-12-29 ENCOUNTER — Ambulatory Visit (INDEPENDENT_AMBULATORY_CARE_PROVIDER_SITE_OTHER): Payer: BLUE CROSS/BLUE SHIELD | Admitting: Pharmacist Clinician (PhC)/ Clinical Pharmacy Specialist

## 2017-12-29 DIAGNOSIS — B182 Chronic viral hepatitis C: Secondary | ICD-10-CM

## 2017-12-29 LAB — COMPLETE METABOLIC PANEL WITH GFR
AG Ratio: 1.3 (calc) (ref 1.0–2.5)
ALT: 19 U/L (ref 6–29)
AST: 20 U/L (ref 10–35)
Albumin: 4 g/dL (ref 3.6–5.1)
Alkaline phosphatase (APISO): 51 U/L (ref 33–130)
BUN: 19 mg/dL (ref 7–25)
CO2: 25 mmol/L (ref 20–32)
Calcium: 7.5 mg/dL — ABNORMAL LOW (ref 8.6–10.4)
Chloride: 98 mmol/L (ref 98–110)
Creat: 0.99 mg/dL (ref 0.50–0.99)
GFR, Est African American: 70 mL/min/{1.73_m2} (ref >=60)
GFR, Est Non African American: 61 mL/min/{1.73_m2} (ref >=60)
Globulin: 3 g/dL (calc) (ref 1.9–3.7)
Glucose, Bld: 119 mg/dL — ABNORMAL HIGH (ref 65–99)
Potassium: 4 mmol/L (ref 3.5–5.3)
Sodium: 135 mmol/L (ref 135–146)
Total Bilirubin: 0.9 mg/dL (ref 0.2–1.2)
Total Protein: 7 g/dL (ref 6.1–8.1)

## 2017-12-29 LAB — CBC
HCT: 38.8 % (ref 35.0–45.0)
Hemoglobin: 13.4 g/dL (ref 11.7–15.5)
MCH: 30.9 pg (ref 27.0–33.0)
MCHC: 34.5 g/dL (ref 32.0–36.0)
MCV: 89.4 fL (ref 80.0–100.0)
MPV: 12.5 fL (ref 7.5–12.5)
Platelets: 146 10*3/uL (ref 140–400)
RBC: 4.34 10*6/uL (ref 3.80–5.10)
RDW: 13.2 % (ref 11.0–15.0)
WBC: 12.3 10*3/uL — ABNORMAL HIGH (ref 3.8–10.8)

## 2017-12-29 NOTE — Progress Notes (Signed)
HPI: Tara [name] is a 62 y.o. female who is here for her EOT visit for her hep C.   Lab Results  Component Value Date   HCVGENOTYPE 1a 04/21/2017    Allergies: Allergies  Allergen Reactions  . Aspirin Other (See Comments)    Pt has a hx of TTP.   . Nsaids Other (See Comments)    Pt has a hx of TTP.     Vitals:    Past Medical History: Past Medical History:  Diagnosis Date  . Anxiety   . Arthritis   . Asthma   . Blood transfusion   . Breast cancer ([place]) 2012   left breast  . Chronic edema   . Complication of anesthesia    difficulty waking up/dizzy/lightheaded  . Dysrhythmia    irregular heartbeat, takes digoxin  . Environmental allergies   . H/O varicella   . H/O varicose veins   . Hepatitis C   . History of measles, mumps, or rubella   . Hx of radiation therapy 12/15/11 - 01/29/12   left breast  . Hypothyroidism   . Shortness of breath   . Thrombocytopenia, primary ([place])   . TTP (thrombotic thrombocytopenic purpura) ([place])     Social History: Social History   Socioeconomic History  . Marital status: Married    Spouse name: Not on file  . Number of children: Not on file  . Years of education: Not on file  . Highest education level: Not on file  Occupational History  . Not on file  Social Needs  . Financial resource strain: Not on file  . Food insecurity:    Worry: Not on file    Inability: Not on file  . Transportation needs:    Medical: Not on file    Non-medical: Not on file  Tobacco Use  . Smoking status: Never Smoker  . Smokeless tobacco: Never Used  Substance and Sexual Activity  . Alcohol use: Yes    Alcohol/week: 0.6 oz    Types: 1 Glasses of wine per week    Comment: occ  . Drug use: No  . Sexual activity: Not on file  Lifestyle  . Physical activity:    Days per week: Not on file    Minutes per session: Not on file  . Stress: Not on file  Relationships  . Social connections:    Talks on phone: Not on file    Gets together: Not  on file    Attends religious service: Not on file    Active member of club or organization: Not on file    Attends meetings of clubs or organizations: Not on file    Relationship status: Not on file  Other Topics Concern  . Not on file  Social History Narrative  . Not on file    Labs: Hep B S Ab (no units)  Date Value  04/21/2017 NON-REACTIVE   Hepatitis B Surface Ag (no units)  Date Value  04/21/2017 NON-REACTIVE   HCV Ab (no units)  Date Value  03/26/2017 REACTIVE (A)    Lab Results  Component Value Date   HCVGENOTYPE 1a 04/21/2017    Hepatitis C RNA quantitative Latest Ref Rng & Units 10/21/2017 03/26/2017 04/12/2013  HCV Quantitative NOT DETECTED IU/mL - 1,370,000(H) 374,472(H)  HCV Quantitative Log NOT DETECT Log IU/mL <1.18 DETECTED(A) 6.14(H) 5.57(H)    AST (U/L)  Date Value  10/21/2017 19  04/21/2017 31  04/20/2017 32  08/13/2016 25  06/29/2016 22  08/12/2015  29   ALT (U/L)  Date Value  10/21/2017 19  04/21/2017 33 (H)  04/21/2017 32 (H)  04/20/2017 35  08/13/2016 31  06/29/2016 22  08/12/2015 32   INR (no units)  Date Value  04/21/2017 0.9  09/07/2013 0.96    CrCl: CrCl cannot be calculated (Patient's most recent lab result is older than the maximum 21 days allowed.).  Fibrosis Score: F3/4 as assessed by ARFI  Child-Pugh Score: Class A  Previous Treatment Regimen: Interferon  Assessment: Tara [name] finished her 12 wks of Harvoni last week. She didn't miss a dose. Her first VL has been <15 so far. She didn't have any issue taking the meds. Offered the hep A and B today but she wants to think about it and make a decision at that visit. We will get the EOT VL today.   Recommendations:  Hep C VL, CMP today SVR12, CMP in 3 months F/u cure with Dr. Lorenda Hatchet [place], [place].D., BCPS, AAHIVP Clinical Infectious [place] [facility] 12/29/2017, 11:48 AM

## 2017-12-31 LAB — HEPATITIS C RNA QUANTITATIVE
HCV Quantitative Log: <1.18 Log IU/mL
HCV RNA, PCR, QN: <15 IU/mL

## 2018-01-09 ENCOUNTER — Encounter: Payer: Self-pay | Admitting: Internal Medicine

## 2018-01-10 ENCOUNTER — Other Ambulatory Visit: Payer: Self-pay

## 2018-01-10 DIAGNOSIS — R739 Hyperglycemia, unspecified: Secondary | ICD-10-CM

## 2018-01-10 MED ORDER — DULOXETINE HCL 60 MG PO CPEP: 60.0000 mg | ORAL_CAPSULE | Freq: Every day | ORAL | 0 refills | Status: DC

## 2018-01-22 ENCOUNTER — Other Ambulatory Visit: Payer: Self-pay | Admitting: Internal Medicine

## 2018-01-25 ENCOUNTER — Other Ambulatory Visit: Payer: BLUE CROSS/BLUE SHIELD | Admitting: Internal Medicine

## 2018-01-25 DIAGNOSIS — R739 Hyperglycemia, unspecified: Secondary | ICD-10-CM

## 2018-01-25 DIAGNOSIS — R7302 Impaired glucose tolerance (oral): Secondary | ICD-10-CM

## 2018-01-26 LAB — HEMOGLOBIN A1C
Hgb A1c MFr Bld: 6.1 % of total Hgb — ABNORMAL HIGH (ref <5.7)
Mean Plasma Glucose: 128 (calc)
eAG (mmol/L): 7.1 (calc)

## 2018-01-27 ENCOUNTER — Ambulatory Visit: Payer: BLUE CROSS/BLUE SHIELD | Admitting: Internal Medicine

## 2018-01-28 ENCOUNTER — Encounter: Payer: Self-pay | Admitting: Internal Medicine

## 2018-01-28 ENCOUNTER — Ambulatory Visit: Payer: BLUE CROSS/BLUE SHIELD | Admitting: Internal Medicine

## 2018-01-28 VITALS — BP 110/80 | HR 116 | Temp 97.9°F | Ht 67.0 in | Wt 230.0 lb

## 2018-01-28 DIAGNOSIS — G4762 Sleep related leg cramps: Secondary | ICD-10-CM

## 2018-01-28 DIAGNOSIS — R7302 Impaired glucose tolerance (oral): Secondary | ICD-10-CM | POA: Diagnosis not present

## 2018-01-28 DIAGNOSIS — R0789 Other chest pain: Secondary | ICD-10-CM | POA: Diagnosis not present

## 2018-01-28 DIAGNOSIS — Z853 Personal history of malignant neoplasm of breast: Secondary | ICD-10-CM | POA: Diagnosis not present

## 2018-01-28 DIAGNOSIS — J0100 Acute maxillary sinusitis, unspecified: Secondary | ICD-10-CM | POA: Diagnosis not present

## 2018-01-28 MED ORDER — LEVOFLOXACIN 500 MG PO TABS: 500.0000 mg | ORAL_TABLET | Freq: Every day | ORAL | 0 refills | Status: DC

## 2018-01-28 MED ORDER — PREDNISONE 10 MG PO TABS: ORAL_TABLET | ORAL | 0 refills | Status: DC

## 2018-01-28 MED ORDER — GABAPENTIN 300 MG PO CAPS: 300.0000 mg | ORAL_CAPSULE | Freq: Two times a day (BID) | ORAL | 3 refills | Status: DC

## 2018-01-28 NOTE — Progress Notes (Signed)
   Subjective:    Patient ID: Tara [name], female    DOB: 1956-08-18, 62 y.o.   MRN: [id number]  HPI She was here in February for health maintenance exam and evaluation of medical issues.  Weight had increased nearly 7 pounds.  She has been treated with Harvoni for hepatitis C.  History of breast cancer followed by Dr. Jana Hakim.  At that time hemoglobin A1c had increased from 5.6% the previous year to 6.3%.  Patient was placed on metformin and she is now here for follow-up.  Tolerating metformin.  She has lost 4 pounds since April.  Hemoglobin A1c improved from 6.3 to 6.1%.  She brings up other issues.  She apparently has had a recurrence of allergy symptoms and sinusitis.  She was treated in April with Levaquin and prednisone.  Now has similar symptoms again with coughing and nasal congestion with some discolored sputum production.  She is on Singulair.  We have ordered repeat prednisone taper over 7 days and Levaquin 500 mg daily for 10 days as of today.  If symptoms persist she will need to see allergist.  Says she was allergy tested in high school but does not recall exactly what she was allergic to.  Complaining of left lower thoracic pain that has been present now for months.  She has palpable deformity in the left lower rib but is not tender on that deformity.  She says that is the general area of her left back pain.  She tried decreasing her Cymbalta but says she did not get relief.  We will see if the prednisone taper helps.  If not, she will have to go back to physical therapy.  She had a CT scan with contrast in July 2018 for the same complaint There was some mild thoracic lymphadenopathy and upper abdominal lymphadenopathy but was thought perhaps to be related to Hepatitis C.  If symptoms persist we will need to rescan her.  Still complaining of nocturnal leg cramps not finding to magnesium.  Start her on gabapentin 300 mg twice daily and can increase to 3 times daily.  Review of Systems    Objective:   Physical Exam Pharynx is clear.  TMs are clear.  She sounds nasally congested and is coughing and blowing her nose in the office.  Neck is supple.  Chest clear to auscultation.  She has some mild tenderness in her left lower rib cage just below that area laterally       Assessment & Plan:  Protracted respiratory infection-treat with 7-day prednisone taper and Levaquin 500 mg daily for 10 days  Left-sided back pain which is been present for some time.  If not improving will need to consider repeat CT scan with  Nocturnal leg cramps start on gabapentin 300 mg twice daily  Continue metformin for impaired glucose tolerance.  Continue to work on diet and exercise.

## 2018-01-28 NOTE — Patient Instructions (Signed)
Take prednisone and tapering course as directed over 7 days.  Levaquin 500 mg daily for 10 days.  Gabapentin 300 mg twice daily for pain.  If symptoms persist, will perform another CT scan and refer to physical therapy.  Stop magnesium as it is not helping not leg cramps.  Continue metformin for impaired glucose tolerance.  Physical exam due in November.

## 2018-02-06 ENCOUNTER — Other Ambulatory Visit: Payer: Self-pay | Admitting: Internal Medicine

## 2018-02-13 ENCOUNTER — Encounter: Payer: Self-pay | Admitting: Internal Medicine

## 2018-02-17 ENCOUNTER — Other Ambulatory Visit: Payer: Self-pay | Admitting: Internal Medicine

## 2018-02-21 ENCOUNTER — Encounter: Payer: Self-pay | Admitting: Internal Medicine

## 2018-02-21 ENCOUNTER — Ambulatory Visit (INDEPENDENT_AMBULATORY_CARE_PROVIDER_SITE_OTHER): Payer: BLUE CROSS/BLUE SHIELD | Admitting: Internal Medicine

## 2018-02-21 VITALS — BP 110/70 | HR 94 | Temp 98.3°F | Ht 67.0 in | Wt 232.0 lb

## 2018-02-21 DIAGNOSIS — Z8619 Personal history of other infectious and parasitic diseases: Secondary | ICD-10-CM | POA: Diagnosis not present

## 2018-02-21 DIAGNOSIS — Z853 Personal history of malignant neoplasm of breast: Secondary | ICD-10-CM

## 2018-02-21 DIAGNOSIS — R0781 Pleurodynia: Secondary | ICD-10-CM

## 2018-02-21 MED ORDER — HYDROCODONE-ACETAMINOPHEN 5-325 MG PO TABS: 1.0000 | ORAL_TABLET | Freq: Four times a day (QID) | ORAL | 0 refills | Status: DC | PRN

## 2018-02-21 NOTE — Patient Instructions (Signed)
Tramadol cannot be used in conjunction with Cymbalta.  We will have to use sparingly hydrocodone APAP 5/325 every 6 hours as needed pain #20 prescribed.  Bone scan ordered.

## 2018-02-21 NOTE — Progress Notes (Signed)
   Subjective:    Patient ID: Tara [name], female    DOB: [birth year], 62 y.o.   MRN: [id number]  HPI Some 10 years ago, had a rib fracture she thinks it was on left side.For at least a year, has had left sided rib cage pain that did not improve with Cymbalta or PT. Responds to decreased physical exertion. Bending and reaching seems to aggravate it as does picking up 14 pound dog.    Review of Systems     Objective:   Physical Exam  She has significant discrete point tenderness along the left lower rib  apprx mid axillary line and slightly behind that area.      Assessment & Plan:  Rib pain  Plan: Her rib pain is been ongoing now for about a year.  She has been on Cymbalta which has not really helped control the pain.  I cannot use tramadol because she is on Cymbalta and there is an interaction.  She had left rib detail films in April 6962 using a metallic marker and no rib fracture was seen.  She had a CT of her chest with contrast and July 2018 showing some mediastinal enlarged lymph nodes.  No pleural effusion or pneumothorax.  Upper abdominal lymph nodes had increased in size.  Her oncologist is aware of these findings.  She has completed treatment for Hepatitis C.  I think it would be prudent to repeat CT of the chest with contrast and also consider a bone scan.

## 2018-02-24 ENCOUNTER — Encounter: Payer: Self-pay | Admitting: Podiatry

## 2018-02-24 ENCOUNTER — Ambulatory Visit: Payer: BLUE CROSS/BLUE SHIELD | Admitting: Podiatry

## 2018-02-24 DIAGNOSIS — L84 Corns and callosities: Secondary | ICD-10-CM | POA: Diagnosis not present

## 2018-02-24 DIAGNOSIS — M779 Enthesopathy, unspecified: Secondary | ICD-10-CM

## 2018-02-24 MED ORDER — TRIAMCINOLONE ACETONIDE 10 MG/ML IJ SUSP
10.0000 mg | Freq: Once | INTRAMUSCULAR | Status: AC
  Administered 2018-02-24: 10 mg

## 2018-02-25 ENCOUNTER — Encounter: Payer: Self-pay | Admitting: Internal Medicine

## 2018-02-25 NOTE — Progress Notes (Signed)
Subjective:   Patient ID: Tara [name], female   DOB: 62 y.o.   MRN: [id number]   HPI Patient presents with inflammation base of fifth metatarsal left and is also noted to have a severe plantar keratotic lesion left is painful when pressed.  There is fluid in the plantar capsule and it is painful when palpated   ROS      Objective:  Physical Exam  Inflammatory capsulitis of fifth MPJ left with fluid buildup and pain and keratotic lesion also noted     Assessment:  Capsulitis fifth metatarsal base left along with keratotic lesion formation     Plan:  Careful injection of the plantar capsule left 3 mg Kenalog 5 mg Xylocaine and then appropriate debridement of plantar lesion left with no iatrogenic bleeding noted and applied padding to the area

## 2018-03-02 ENCOUNTER — Other Ambulatory Visit: Payer: Self-pay | Admitting: Internal Medicine

## 2018-03-09 ENCOUNTER — Telehealth: Payer: Self-pay | Admitting: Oncology

## 2018-03-09 NOTE — Telephone Encounter (Signed)
Called regarding date change °

## 2018-03-11 ENCOUNTER — Ambulatory Visit (HOSPITAL_COMMUNITY)
Admission: RE | Admit: 2018-03-11 | Discharge: 2018-03-11 | Disposition: A | Discharge status: Home / Self Care | Payer: BLUE CROSS/BLUE SHIELD | Source: Ambulatory Visit | Attending: Internal Medicine | Admitting: Internal Medicine

## 2018-03-11 ENCOUNTER — Encounter (HOSPITAL_COMMUNITY)
Admission: RE | Admit: 2018-03-11 | Discharge: 2018-03-11 | Disposition: A | Discharge status: Home / Self Care | Payer: BLUE CROSS/BLUE SHIELD | Source: Ambulatory Visit | Attending: Internal Medicine | Admitting: Internal Medicine

## 2018-03-11 DIAGNOSIS — R0781 Pleurodynia: Secondary | ICD-10-CM | POA: Insufficient documentation

## 2018-03-11 DIAGNOSIS — M25572 Pain in left ankle and joints of left foot: Secondary | ICD-10-CM | POA: Diagnosis not present

## 2018-03-11 MED ORDER — TECHNETIUM TC 99M MEDRONATE IV KIT
20.0000 | PACK | Freq: Once | INTRAVENOUS | Status: AC | PRN
  Administered 2018-03-11: 20.4 via INTRAVENOUS

## 2018-03-17 ENCOUNTER — Encounter: Payer: Self-pay | Admitting: Internal Medicine

## 2018-03-17 ENCOUNTER — Ambulatory Visit (INDEPENDENT_AMBULATORY_CARE_PROVIDER_SITE_OTHER): Payer: BLUE CROSS/BLUE SHIELD | Admitting: Internal Medicine

## 2018-03-17 VITALS — BP 128/70 | HR 92 | Ht 67.0 in | Wt 234.0 lb

## 2018-03-17 DIAGNOSIS — R0781 Pleurodynia: Secondary | ICD-10-CM

## 2018-03-17 MED ORDER — GABAPENTIN 300 MG PO CAPS: 300.0000 mg | ORAL_CAPSULE | Freq: Two times a day (BID) | ORAL | 5 refills | Status: DC

## 2018-03-17 MED ORDER — HYDROCODONE-ACETAMINOPHEN 5-325 MG PO TABS: 1.0000 | ORAL_TABLET | Freq: Four times a day (QID) | ORAL | 0 refills | Status: DC | PRN

## 2018-03-18 ENCOUNTER — Other Ambulatory Visit: Payer: Self-pay

## 2018-03-18 ENCOUNTER — Emergency Department (HOSPITAL_BASED_OUTPATIENT_CLINIC_OR_DEPARTMENT_OTHER)
Admission: EM | Admit: 2018-03-18 | Discharge: 2018-03-18 | Disposition: A | Discharge status: Home / Self Care | Payer: BLUE CROSS/BLUE SHIELD | Attending: Emergency Medicine | Admitting: Emergency Medicine

## 2018-03-18 ENCOUNTER — Encounter (HOSPITAL_BASED_OUTPATIENT_CLINIC_OR_DEPARTMENT_OTHER): Payer: Self-pay | Admitting: Emergency Medicine

## 2018-03-18 ENCOUNTER — Emergency Department (HOSPITAL_BASED_OUTPATIENT_CLINIC_OR_DEPARTMENT_OTHER): Payer: BLUE CROSS/BLUE SHIELD

## 2018-03-18 DIAGNOSIS — R112 Nausea with vomiting, unspecified: Secondary | ICD-10-CM | POA: Insufficient documentation

## 2018-03-18 DIAGNOSIS — R1084 Generalized abdominal pain: Secondary | ICD-10-CM | POA: Insufficient documentation

## 2018-03-18 DIAGNOSIS — E039 Hypothyroidism, unspecified: Secondary | ICD-10-CM | POA: Diagnosis not present

## 2018-03-18 DIAGNOSIS — Z96643 Presence of artificial hip joint, bilateral: Secondary | ICD-10-CM | POA: Insufficient documentation

## 2018-03-18 DIAGNOSIS — Z79899 Other long term (current) drug therapy: Secondary | ICD-10-CM | POA: Insufficient documentation

## 2018-03-18 DIAGNOSIS — Z7984 Long term (current) use of oral hypoglycemic drugs: Secondary | ICD-10-CM | POA: Diagnosis not present

## 2018-03-18 DIAGNOSIS — R197 Diarrhea, unspecified: Secondary | ICD-10-CM | POA: Diagnosis not present

## 2018-03-18 DIAGNOSIS — J45909 Unspecified asthma, uncomplicated: Secondary | ICD-10-CM | POA: Diagnosis not present

## 2018-03-18 DIAGNOSIS — Z853 Personal history of malignant neoplasm of breast: Secondary | ICD-10-CM | POA: Diagnosis not present

## 2018-03-18 LAB — CBC WITH DIFFERENTIAL/PLATELET
Basophils Absolute: 0 10*3/uL (ref 0.0–0.1)
Basophils Relative: 0 %
Eosinophils Absolute: 0.1 10*3/uL (ref 0.0–0.7)
Eosinophils Relative: 1 %
HCT: 45.1 % (ref 36.0–46.0)
Hemoglobin: 14.8 g/dL (ref 12.0–15.0)
Lymphocytes Relative: 1 %
Lymphs Abs: 0.3 10*3/uL — ABNORMAL LOW (ref 0.7–4.0)
MCH: 31.4 pg (ref 26.0–34.0)
MCHC: 32.8 g/dL (ref 30.0–36.0)
MCV: 95.8 fL (ref 78.0–100.0)
Monocytes Absolute: 1.2 10*3/uL — ABNORMAL HIGH (ref 0.1–1.0)
Monocytes Relative: 5 %
Neutro Abs: 19.7 10*3/uL — ABNORMAL HIGH (ref 1.7–7.7)
Neutrophils Relative %: 93 %
Platelets: 154 10*3/uL (ref 150–400)
RBC: 4.71 MIL/uL (ref 3.87–5.11)
RDW: 15 % (ref 11.5–15.5)
WBC: 21.3 10*3/uL — ABNORMAL HIGH (ref 4.0–10.5)

## 2018-03-18 LAB — COMPREHENSIVE METABOLIC PANEL
ALT: 28 U/L (ref 0–44)
AST: 30 U/L (ref 15–41)
Albumin: 4.4 g/dL (ref 3.5–5.0)
Alkaline Phosphatase: 56 U/L (ref 38–126)
Anion gap: 19 — ABNORMAL HIGH (ref 5–15)
BUN: 21 mg/dL (ref 8–23)
CO2: 24 mmol/L (ref 22–32)
Calcium: 9 mg/dL (ref 8.9–10.3)
Chloride: 98 mmol/L (ref 98–111)
Creatinine, Ser: 1.1 mg/dL — ABNORMAL HIGH (ref 0.44–1.00)
GFR calc Af Amer: >60 mL/min (ref >60)
GFR calc non Af Amer: 52 mL/min — ABNORMAL LOW (ref >60)
Glucose, Bld: 160 mg/dL — ABNORMAL HIGH (ref 70–99)
Potassium: 4.1 mmol/L (ref 3.5–5.1)
Sodium: 141 mmol/L (ref 135–145)
Total Bilirubin: 1.1 mg/dL (ref 0.3–1.2)
Total Protein: 8.4 g/dL — ABNORMAL HIGH (ref 6.5–8.1)

## 2018-03-18 LAB — I-STAT CG4 LACTIC ACID, ED: Lactic Acid, Venous: 3.68 mmol/L (ref 0.5–1.9)

## 2018-03-18 LAB — URINALYSIS, ROUTINE W REFLEX MICROSCOPIC
Bilirubin Urine: NEGATIVE
Glucose, UA: NEGATIVE mg/dL
Hgb urine dipstick: NEGATIVE
Ketones, ur: NEGATIVE mg/dL
Leukocytes, UA: NEGATIVE
Nitrite: NEGATIVE
Protein, ur: NEGATIVE mg/dL
Specific Gravity, Urine: 1.01 (ref 1.005–1.030)
pH: 6 (ref 5.0–8.0)

## 2018-03-18 LAB — LIPASE, BLOOD: Lipase: 32 U/L (ref 11–51)

## 2018-03-18 MED ORDER — SODIUM CHLORIDE 0.9 % IV BOLUS
1000.0000 mL | Freq: Once | INTRAVENOUS | Status: AC
  Administered 2018-03-18: 1000 mL via INTRAVENOUS

## 2018-03-18 MED ORDER — MORPHINE SULFATE (PF) 2 MG/ML IV SOLN
2.0000 mg | Freq: Once | INTRAVENOUS | Status: AC
  Administered 2018-03-18: 2 mg via INTRAVENOUS
  Filled 2018-03-18: qty 1

## 2018-03-18 MED ORDER — ONDANSETRON 4 MG PO TBDP: ORAL_TABLET | ORAL | 0 refills | Status: DC

## 2018-03-18 MED ORDER — IOPAMIDOL (ISOVUE-300) INJECTION 61%
100.0000 mL | Freq: Once | INTRAVENOUS | Status: AC | PRN
  Administered 2018-03-18: 80 mL via INTRAVENOUS

## 2018-03-18 MED ORDER — MORPHINE SULFATE (PF) 2 MG/ML IV SOLN
INTRAVENOUS | Status: AC
  Administered 2018-03-18: 2 mg via INTRAVENOUS
  Filled 2018-03-18: qty 1

## 2018-03-18 MED ORDER — ONDANSETRON HCL 4 MG/2ML IJ SOLN
4.0000 mg | Freq: Once | INTRAMUSCULAR | Status: AC
  Administered 2018-03-18: 4 mg via INTRAVENOUS
  Filled 2018-03-18: qty 2

## 2018-03-18 NOTE — ED Notes (Signed)
Patient transported to CT 

## 2018-03-18 NOTE — ED Triage Notes (Addendum)
Per EMS patient with c/o n/v/d x 1 hour.  Reports history of cancer- in remission.  Received 4mg  zofran IM via EMS.

## 2018-03-18 NOTE — ED Notes (Signed)
Patient up to bathroom with assistance.

## 2018-03-18 NOTE — ED Notes (Signed)
Pt's husband pulled nurse aside in lobby while she was talking to a different patient and asked when patient would go back to a room.  Nurse assured husband that she will come back to a room as soon as one is available and that we were sorry about the wait.

## 2018-03-18 NOTE — ED Notes (Signed)
IV documentation charted in error- deleted

## 2018-03-18 NOTE — ED Provider Notes (Signed)
[facility] EMERGENCY DEPARTMENT Provider Note   CSN: [id number] Arrival date & time: 03/18/18  1654     History   Chief Complaint Chief Complaint  Patient presents with  . Emesis    HPI Tara [name] is a 62 y.o. female.  62 yo F with a chief complaint of nausea vomiting and diarrhea.  This started earlier today.  She has not been able to stop throwing up.  Eventually called 911.  She has some diffuse crampy abdominal pain now but denied any at the start of this illness.  She does not know of any sick contacts.  She tried a new weight loss shake today.  Otherwise denies any new foods denies any suspicious food or any recent travel.  The history is provided by the patient.  Emesis   This is a new problem. The current episode started less than 1 hour ago. The problem occurs continuously. The problem has not changed since onset.The emesis has an appearance of stomach contents. There has been no fever. Associated symptoms include diarrhea. Pertinent negatives include no arthralgias, no chills, no fever, no headaches and no myalgias.    Past Medical History:  Diagnosis Date  . Anxiety   . Arthritis   . Asthma   . Blood transfusion   . Breast cancer ([place]) 2012   left breast  . Chronic edema   . Complication of anesthesia    difficulty waking up/dizzy/lightheaded  . Dysrhythmia    irregular heartbeat, takes digoxin  . Environmental allergies   . H/O varicella   . H/O varicose veins   . Hepatitis C   . History of measles, mumps, or rubella   . Hx of radiation therapy 12/15/11 - 01/29/12   left breast  . Hypothyroidism   . Shortness of breath   . Thrombocytopenia, primary ([place])   . TTP (thrombotic thrombocytopenic purpura) [facility])     Patient Active Problem List   Diagnosis Date Noted  . Impaired glucose tolerance 10/26/2017  . Cirrhosis ([place]) 05/10/2017  . Paresthesia 03/30/2016  . Hypomagnesemia 03/29/2016    Class: Acute  . Gastroesophageal reflux disease  11/05/2015  . Hoarseness of voice 11/05/2015  . Malignant neoplasm of axillary tail of left breast in female, estrogen receptor positive ([place]) 08/12/2015  . Malignant neoplasm of upper-outer quadrant of left breast in female, estrogen receptor positive ([place]) 07/17/2014  . Hypokalemia 12/29/2013  . Allergic rhinitis 10/29/2012  . Asthma 10/29/2012  . Status post bilateral hip replacements 10/29/2012  . History of TTP (thrombotic thrombocytopenic purpura) 10/29/2012  . Chronic hepatitis C without hepatic coma ([place]) 10/29/2012  . Hx of radiation therapy   . Lymphedema of arm 12/15/2011  . Shortness of breath 11/19/2011  . Rosacea, acne 09/28/2011  . Dysrhythmia, cardiac 09/28/2011  . Hypothyroidism 09/07/2011    Past Surgical History:  Procedure Laterality Date  . BREAST LUMPECTOMY  08/03/11   left lumpectomy and slnbx,T1cN0,triple pos  . elbow pins Left   . FOOT SURGERY Left    multiple  . PORTACATH PLACEMENT  08/03/2011   Procedure: INSERTION PORT-A-CATH;  Surgeon: Merrie Roof, MD;  Location: [place];  Service: General;  Laterality: Right;  . TOTAL HIP ARTHROPLASTY Bilateral      OB History    Gravida  0   Para  0   Term  0   Preterm  0   AB  0   Living  0     SAB  0  TAB  0   Ectopic  0   Multiple  0   Live Births               Home Medications    Prior to Admission medications   Medication Sig Start Date End Date Taking? Authorizing Provider  acetaminophen (TYLENOL) 500 MG tablet Take 500 mg by mouth every 6 (six) hours as needed for mild pain, moderate pain or headache.     [provider]  ADVAIR DISKUS 250-50 MCG/DOSE AEPB INHALE ONE PUFF BY MOUTH TWICE A DAY 02/17/18   [name], MD  albuterol (PROVENTIL HFA;VENTOLIN HFA) 108 (90 BASE) MCG/ACT inhaler Inhale 2 puffs into the lungs every 6 (six) hours as needed for wheezing or shortness of breath.     [provider]  cholecalciferol (VITAMIN D) 1000 UNITS tablet Take 3,000  Units by mouth daily.     [provider]  cyclobenzaprine (FLEXERIL) 10 MG tablet Take 1 tablet (10 mg total) by mouth 3 (three) times daily as needed for muscle spasms. 12/02/16   [name], MD  DIGOX 250 MCG tablet TAKE 1 TABLET BY MOUTH DAILY 12/03/17   [name], MD  DULoxetine (CYMBALTA) 60 MG capsule TAKE ONE CAPSULE BY MOUTH DAILY 02/17/18   [name], MD  fluticasone [employer]) 50 MCG/ACT nasal spray Place 2 sprays into both nostrils daily as needed for rhinitis.    [provider]  gabapentin (NEURONTIN) 300 MG capsule Take 1 capsule (300 mg total) by mouth 2 (two) times daily. 03/17/18   [name], MD  glucosamine-chondroitin 500-400 MG tablet Take 1 tablet by mouth daily.     [provider]  HYDROcodone-acetaminophen (NORCO) 5-325 MG tablet Take 1 tablet by mouth every 6 (six) hours as needed for moderate pain. 03/17/18   [name], MD  levofloxacin (LEVAQUIN) 500 MG tablet Take 1 tablet (500 mg total) by mouth daily. 01/28/18   [name], MD  levothyroxine (SYNTHROID, LEVOTHROID) 88 MCG tablet TAKE ONE TABLET BY MOUTH DAILY 11/15/17   [name], MD  magnesium oxide (MAG-OX) 400 MG tablet Take 400 mg by mouth daily.    [provider]  metFORMIN (GLUCOPHAGE) 500 MG tablet TAKE ONE TABLET BY MOUTH TWO TIMES A DAY WITH A MEAL 02/17/18   [name], MD  montelukast (SINGULAIR) 10 MG tablet TAKE ONE TABLET (10 MG) BY MOUTH AT BEDTIME. 11/25/17   [name], MD  Multiple Vitamin (MULTIVITAMIN WITH MINERALS) TABS tablet Take 1 tablet by mouth daily.    [provider]  ondansetron (ZOFRAN ODT) 4 MG disintegrating tablet 4mg  ODT q4 hours prn nausea/vomit 03/18/18   Deno Etienne, DO  potassium chloride SA (K-DUR,KLOR-CON) 20 MEQ tablet Take 1 tablet (20 mEq total) by mouth daily. 11/15/17   [name], MD  predniSONE (DELTASONE) 10 MG tablet Take in tapering course as directed 6-5-4-3-2-1 01/28/18   [name], MD    torsemide (DEMADEX) 20 MG tablet TAKE ONE TABLET BY MOUTH EVERY DAY. 03/02/18   [name], MD  vitamin B-12 (CYANOCOBALAMIN) 1000 MCG tablet Take 2,000 mcg by mouth daily.    [provider]    Family History Family History  Problem Relation Age of Onset  . Pneumonia Mother   . COPD Father   . Brain cancer Maternal Grandfather   . Alcohol abuse Maternal Grandfather   . Heart disease Neg Hx   . Hyperlipidemia  Neg Hx   . Hypertension Neg Hx     Social History Social History   Tobacco Use  . Smoking status: Never Smoker  . Smokeless tobacco: Never Used  Substance Use Topics  . Alcohol use: Yes    Alcohol/week: 0.6 oz    Types: 1 Glasses of wine per week    Comment: occ  . Drug use: No     Allergies   Aspirin and Nsaids   Review of Systems Review of Systems  Constitutional: Negative for chills and fever.  HENT: Negative for congestion and rhinorrhea.   Eyes: Negative for redness and visual disturbance.  Respiratory: Negative for shortness of breath and wheezing.   Cardiovascular: Negative for chest pain and palpitations.  Gastrointestinal: Positive for diarrhea, nausea and vomiting.  Genitourinary: Negative for dysuria and urgency.  Musculoskeletal: Negative for arthralgias and myalgias.  Skin: Negative for pallor and wound.  Neurological: Negative for dizziness and headaches.     Physical Exam Updated Vital Signs BP 133/70   Pulse (!) 107   Temp 98.5 F (36.9 C) (Oral)   Resp (!) 22   Ht 5\' 6"  (1.676 m)   Wt 107.5 kg (237 lb)   SpO2 92%   BMI 38.25 kg/m   Physical Exam  Constitutional: She is oriented to person, place, and time. She appears well-developed and well-nourished. No distress.  HENT:  Head: Normocephalic and atraumatic.  Eyes: Pupils are equal, round, and reactive to light. EOM are normal.  Neck: Normal range of motion. Neck supple.  Cardiovascular: Normal rate and regular rhythm. Exam reveals no gallop and no friction rub.   No murmur heard. Pulmonary/Chest: Effort normal. She has no wheezes. She has no rales.  Abdominal: Soft. She exhibits no distension and no mass. There is tenderness (very mild diffuse tenderness). There is no guarding.  Musculoskeletal: She exhibits no edema or tenderness.  Neurological: She is alert and oriented to person, place, and time.  Skin: Skin is warm and dry. She is not diaphoretic.  Psychiatric: She has a normal mood and affect. Her behavior is normal.  Nursing note and vitals reviewed.    ED Treatments / Results  Labs (all labs ordered are listed, but only abnormal results are displayed) Labs Reviewed  CBC WITH DIFFERENTIAL/PLATELET - Abnormal; Notable for the following components:      Result Value   WBC 21.3 (*)    Neutro Abs 19.7 (*)    Lymphs Abs 0.3 (*)    Monocytes Absolute 1.2 (*)    All other components within normal limits  COMPREHENSIVE METABOLIC PANEL - Abnormal; Notable for the following components:   Glucose, Bld 160 (*)    Creatinine, Ser 1.10 (*)    Total Protein 8.4 (*)    GFR calc non Af Amer 52 (*)    Anion gap 19 (*)    All other components within normal limits  I-STAT CG4 LACTIC ACID, ED - Abnormal; Notable for the following components:   Lactic Acid, Venous 3.68 (*)    All other components within normal limits  LIPASE, BLOOD  URINALYSIS, ROUTINE W REFLEX MICROSCOPIC  I-STAT CG4 LACTIC ACID, ED    EKG None  Radiology Ct Abdomen Pelvis W Contrast  Result Date: 03/18/2018 CLINICAL DATA:  Acute generalized abdominal pain with vomiting and diarrhea x6 hours. History of breast cancer. EXAM: CT ABDOMEN AND PELVIS WITH CONTRAST TECHNIQUE: Multidetector CT imaging of the abdomen and pelvis was performed using the standard protocol following bolus administration of  intravenous contrast. CONTRAST:  41mL ISOVUE-300 IOPAMIDOL (ISOVUE-300) INJECTION 61% COMPARISON:  Abdominal ultrasound 04/28/2017, MRI 05/21/2017, bone scan 03/11/2018 FINDINGS: Lower  chest: Normal size heart without pericardial effusion. Atelectasis at the lung bases without consolidation, effusion or pneumothorax. Hepatobiliary: Scattered tiny too small to further characterize hypodensities are noted within the liver, similar in appearance to prior MRI may reflect tiny cysts, hemangiomata or hamartomas among more common considerations though not exclusive. No biliary dilatation. Physiologically distended gallbladder without stones. No wall thickening or pericholecystic fluid. Pancreas: No ductal dilatation or enhancing mass.  No inflammation. Spleen: Normal Adrenals/Urinary Tract: Normal bilateral adrenal glands. Small exophytic cyst arising from the upper pole of the left kidney measuring up to 12 mm. No worrisome feature is identified. Symmetric pyelograms on delayed images through both kidneys. Stomach/Bowel: Small hiatal hernia. Physiologic distention of the stomach with normal duodenal sweep and ligament of Treitz position. Mild diffuse small bowel fluid-filled distention and liquid stool within the colon is noted. Findings likely represent diffuse small bowel enteritis and diarrheal disease. No inflammatory thickening of the intestine is identified. Normal-appearing appendix. Vascular/Lymphatic: Nonaneurysmal abdominal aorta. No lymphadenopathy. Reproductive: Uterus and adnexa are obscured by streak artifacts from the patient's hip arthroplasties. Other: No free air nor free fluid. Musculoskeletal: Bilateral hip arthroplasties without complicating features. No CT findings involving the right superior pubic ramus to explain the patient's increased uptake though assessment is limited by the metallic artifacts overlying it. A sclerotic densities seen in the right sacrum adjacent to the S1 foramen, likely a bone island given lack of significant activity on recent bone scan. IMPRESSION: 1. Diffuse mild fluid-filled distention of small bowel compatible with small bowel enteritis. Liquid stool  within the colon would be in keeping with diarrheal disease. No inflammatory or infectious thickening of bowel. No bowel obstruction. 2. Tiny too small to characterize hypodensities are redemonstrated within the liver, nonspecific more commonly associated with cysts or hemangiomata. 3. Small exophytic cyst arising from the upper pole of left kidney without complicating or worrisome features. 4. No aggressive osseous lesion is noted. Bilateral hip arthroplasties creating metallic streak artifacts limit assessment in the pelvis. Electronically Signed   By: Ashley Royalty M.D.   On: 03/18/2018 20:14    Procedures Procedures (including critical care time)  Medications Ordered in ED Medications  sodium chloride 0.9 % bolus 1,000 mL (0 mLs Intravenous Stopped 03/18/18 1850)  ondansetron (ZOFRAN) injection 4 mg (4 mg Intravenous Given 03/18/18 1814)  morphine 2 MG/ML injection 2 mg (2 mg Intravenous Given 03/18/18 1826)  sodium chloride 0.9 % bolus 1,000 mL (0 mLs Intravenous Stopped 03/18/18 2001)  iopamidol (ISOVUE-300) 61 % injection 100 mL (80 mLs Intravenous Contrast Given 03/18/18 1926)     Initial Impression / Assessment and Plan / ED Course  I have reviewed the triage vital signs and the nursing notes.  Pertinent labs & imaging results that were available during my care of the patient were reviewed by me and considered in my medical decision making (see chart for details).  Clinical Course as of Mar 18 2110  Fri Mar 18, 2018  1854 Patient reassessed and is persistently tachycardic still continuing of crampy abdominal pain.  She has a leukocytosis of 21,000 and so I will obtain a CT scan of the abdomen pelvis.   [DF]    Clinical Course User Index [DF] Deno Etienne, DO    62 yo F with a chief complaint of nausea vomiting diarrhea.  Afterwards she has had some mild diffuse  abdominal pain.  She has a very benign exam for me.  We will give fluids nausea and pain medicine and reassess.  The patient  on reassessment is feeling much better.  CT scan shows enteritis.  I discussed the results with the patient she feels much better is tolerating p.o. and is requesting discharge home.  I discussed with her about her lactic acid elevation as well as her continued tachycardia.  At this point the patient does not want to come to the hospital we will have her follow-up with her PCP.  Return for inability to eat or drink or worsening abdominal pain.  9:11 PM:  I have discussed the diagnosis/risks/treatment options with the patient and family and believe the pt to be eligible for discharge home to follow-up with PCP. We also discussed returning to the ED immediately if new or worsening sx occur. We discussed the sx which are most concerning (e.g., sudden worsening pain, fever, inability to tolerate by mouth) that necessitate immediate return. Medications administered to the patient during their visit and any new prescriptions provided to the patient are listed below.  Medications given during this visit Medications  sodium chloride 0.9 % bolus 1,000 mL (0 mLs Intravenous Stopped 03/18/18 1850)  ondansetron (ZOFRAN) injection 4 mg (4 mg Intravenous Given 03/18/18 1814)  morphine 2 MG/ML injection 2 mg (2 mg Intravenous Given 03/18/18 1826)  sodium chloride 0.9 % bolus 1,000 mL (0 mLs Intravenous Stopped 03/18/18 2001)  iopamidol (ISOVUE-300) 61 % injection 100 mL (80 mLs Intravenous Contrast Given 03/18/18 1926)      The patient appears reasonably screen and/or stabilized for discharge and I doubt any other medical condition or other [facility] requiring further screening, evaluation, or treatment in the ED at this time prior to discharge.    Final Clinical Impressions(s) / ED Diagnoses   Final diagnoses:  Nausea vomiting and diarrhea    ED Discharge Orders        Ordered    ondansetron (ZOFRAN ODT) 4 MG disintegrating tablet     03/18/18 2055       Deno Etienne, DO 03/18/18 2111

## 2018-03-18 NOTE — Discharge Instructions (Signed)
Return for abdominal pain, fever, inability to eat or drink.  Imodium for diarrhea.

## 2018-03-19 NOTE — Progress Notes (Signed)
   Subjective:    Patient ID: Tara [name], female    DOB: 1956/08/12, 62 y.o.   MRN: [id number]  HPI 62 year old Female recently had nuclear medicine bone density study for prolonged and protracted left-sided rib cage pain.  She has a remote history of breast cancer.  No lesions were identified along the rib cage area however there was increased uptake in the right superior pubic ramus that was nonspecific that could have perhaps represented a fracture given history of recent fall.  However patient is not symptomatic in that area.  Asymmetrical intake left greater than right ankle.  At this point she is really looking for pain relief in the left rib cage area.  It would seem that perhaps a nerve block would be reasonable.  This has been going on for about a year now.  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  They will look into a referral at that facility.  She has follow-up with Dr. Jana Hakim soon.    Review of Systems see above     Objective:   Physical Exam  Not examined today but spent 15 minutes speaking with patient and her husband about these results.      Assessment & Plan:  Protracted left rib cage pain  Plan: Patient and her husband will investigate the pain management center in [facility] nearly at home where they have been doing some volunteer work.  I have suggested that perhaps a nerve block would be reasonable at this point.  She will follow-up with Dr. Jana Hakim for routine oncology appointment soon.  I believe she has late August.

## 2018-03-21 NOTE — Patient Instructions (Signed)
Patient and her husband will investigate appointment for her hip pain management [facility] [place] near the.  Follow-up with Dr. Jana Hakim late August.

## 2018-03-23 ENCOUNTER — Encounter: Payer: Self-pay | Admitting: Internal Medicine

## 2018-03-24 ENCOUNTER — Other Ambulatory Visit: Payer: BLUE CROSS/BLUE SHIELD

## 2018-03-24 DIAGNOSIS — B182 Chronic viral hepatitis C: Secondary | ICD-10-CM

## 2018-03-24 LAB — COMPLETE METABOLIC PANEL WITH GFR
AG Ratio: 1.5 (calc) (ref 1.0–2.5)
ALT: 24 U/L (ref 6–29)
AST: 22 U/L (ref 10–35)
Albumin: 4.3 g/dL (ref 3.6–5.1)
Alkaline phosphatase (APISO): 51 U/L (ref 33–130)
BUN: 12 mg/dL (ref 7–25)
CO2: 30 mmol/L (ref 20–32)
Calcium: 8.9 mg/dL (ref 8.6–10.4)
Chloride: 98 mmol/L (ref 98–110)
Creat: 0.92 mg/dL (ref 0.50–0.99)
GFR, Est African American: 77 mL/min/{1.73_m2} (ref >=60)
GFR, Est Non African American: 66 mL/min/{1.73_m2} (ref >=60)
Globulin: 2.8 g/dL (calc) (ref 1.9–3.7)
Glucose, Bld: 133 mg/dL — ABNORMAL HIGH (ref 65–99)
Potassium: 4.6 mmol/L (ref 3.5–5.3)
Sodium: 139 mmol/L (ref 135–146)
Total Bilirubin: 1 mg/dL (ref 0.2–1.2)
Total Protein: 7.1 g/dL (ref 6.1–8.1)

## 2018-03-28 LAB — HEPATITIS C RNA QUANTITATIVE
HCV Quantitative Log: <1.18 Log IU/mL
HCV RNA, PCR, QN: <15 IU/mL

## 2018-03-31 ENCOUNTER — Other Ambulatory Visit: Payer: Self-pay | Admitting: Internal Medicine

## 2018-03-31 ENCOUNTER — Other Ambulatory Visit: Payer: BLUE CROSS/BLUE SHIELD

## 2018-04-13 ENCOUNTER — Ambulatory Visit (HOSPITAL_COMMUNITY)
Admission: RE | Admit: 2018-04-13 | Discharge: 2018-04-13 | Disposition: A | Discharge status: Home / Self Care | Payer: BLUE CROSS/BLUE SHIELD | Source: Ambulatory Visit | Attending: Oncology | Admitting: Oncology

## 2018-04-13 ENCOUNTER — Encounter (HOSPITAL_COMMUNITY): Payer: Self-pay

## 2018-04-13 ENCOUNTER — Other Ambulatory Visit: Payer: Managed Care, Other (non HMO)

## 2018-04-13 ENCOUNTER — Ambulatory Visit: Payer: Managed Care, Other (non HMO) | Admitting: Oncology

## 2018-04-13 DIAGNOSIS — I700 Atherosclerosis of aorta: Secondary | ICD-10-CM | POA: Insufficient documentation

## 2018-04-13 DIAGNOSIS — R918 Other nonspecific abnormal finding of lung field: Secondary | ICD-10-CM | POA: Diagnosis not present

## 2018-04-13 DIAGNOSIS — R591 Generalized enlarged lymph nodes: Secondary | ICD-10-CM | POA: Diagnosis not present

## 2018-04-13 DIAGNOSIS — C50612 Malignant neoplasm of axillary tail of left female breast: Secondary | ICD-10-CM | POA: Insufficient documentation

## 2018-04-13 DIAGNOSIS — C50412 Malignant neoplasm of upper-outer quadrant of left female breast: Secondary | ICD-10-CM

## 2018-04-13 DIAGNOSIS — Z170 Estrogen receptor positive status [ER+]: Secondary | ICD-10-CM | POA: Diagnosis present

## 2018-04-13 MED ORDER — IOHEXOL 300 MG/ML  SOLN
75.0000 mL | Freq: Once | INTRAMUSCULAR | Status: AC | PRN
  Administered 2018-04-13: 75 mL via INTRAVENOUS

## 2018-04-15 ENCOUNTER — Other Ambulatory Visit: Payer: Self-pay | Admitting: Oncology

## 2018-04-20 ENCOUNTER — Encounter: Payer: Self-pay | Admitting: Internal Medicine

## 2018-04-20 ENCOUNTER — Ambulatory Visit: Payer: Managed Care, Other (non HMO) | Admitting: Oncology

## 2018-04-20 ENCOUNTER — Other Ambulatory Visit: Payer: BLUE CROSS/BLUE SHIELD

## 2018-04-20 ENCOUNTER — Ambulatory Visit (INDEPENDENT_AMBULATORY_CARE_PROVIDER_SITE_OTHER): Payer: BLUE CROSS/BLUE SHIELD | Admitting: Internal Medicine

## 2018-04-20 VITALS — BP 121/78 | HR 99 | Temp 97.4°F | Ht 67.0 in | Wt 243.1 lb

## 2018-04-20 DIAGNOSIS — B182 Chronic viral hepatitis C: Secondary | ICD-10-CM

## 2018-04-20 DIAGNOSIS — Z7189 Other specified counseling: Secondary | ICD-10-CM | POA: Diagnosis not present

## 2018-04-20 DIAGNOSIS — K717 Toxic liver disease with fibrosis and cirrhosis of liver: Secondary | ICD-10-CM | POA: Diagnosis not present

## 2018-04-20 DIAGNOSIS — Z7185 Encounter for immunization safety counseling: Secondary | ICD-10-CM

## 2018-04-21 DIAGNOSIS — Z7185 Encounter for immunization safety counseling: Secondary | ICD-10-CM | POA: Insufficient documentation

## 2018-04-21 DIAGNOSIS — Z7189 Other specified counseling: Secondary | ICD-10-CM | POA: Insufficient documentation

## 2018-04-21 NOTE — Assessment & Plan Note (Addendum)
No cirrhosis on ultrasound but F3/4 disease.  Normal plateltes.  I discussed the results and need for screening.  Will do [facility] screening every 6 months and refer to GI for possible EGD.  She is also asking about doing it with a colonoscopy when due.

## 2018-04-21 NOTE — Progress Notes (Signed)
   Subjective:    Patient ID: Tara [name], female    DOB: [birth year], 62 y.o.   MRN: [id number]  HPI Here for follow up of chronic hepatitis C She has genotype 1a with a viral load of 1,370,000 and treated with Harvoni for 12 weeks.  Here for her SVR 12 which is negative, confirming cure.  No associated fatigue.  Has not wanted hepatitis A and B vaccine.  elastography with F3/4.  Recent CT abdomen with tiny cysts, no concerning mass.     Review of Systems  Constitutional: Negative for fatigue.  Skin: Negative for rash.       Objective:   Physical Exam  Constitutional: She appears well-developed and well-nourished. No distress.  HENT:  Mouth/Throat: No oropharyngeal exudate.  Eyes: No scleral icterus.  Cardiovascular: Normal rate, regular rhythm and normal heart sounds.  No murmur heard. Pulmonary/Chest: Effort normal and breath sounds normal. No respiratory distress.  Skin: No rash noted.   SH: no alcohol       Assessment & Plan:

## 2018-04-21 NOTE — Assessment & Plan Note (Signed)
Discussed vaccines.  Has had pneumovax

## 2018-04-21 NOTE — Assessment & Plan Note (Signed)
Now considered cured.  

## 2018-04-27 ENCOUNTER — Inpatient Hospital Stay: Payer: BLUE CROSS/BLUE SHIELD | Attending: Oncology | Admitting: Adult Health

## 2018-04-27 ENCOUNTER — Telehealth: Payer: Self-pay | Admitting: Adult Health

## 2018-04-27 ENCOUNTER — Encounter: Payer: Self-pay | Admitting: Adult Health

## 2018-04-27 ENCOUNTER — Inpatient Hospital Stay: Payer: BLUE CROSS/BLUE SHIELD

## 2018-04-27 VITALS — BP 114/94 | HR 92 | Temp 97.8°F | Resp 14 | Ht 67.0 in | Wt 243.9 lb

## 2018-04-27 DIAGNOSIS — Z923 Personal history of irradiation: Secondary | ICD-10-CM | POA: Insufficient documentation

## 2018-04-27 DIAGNOSIS — C50412 Malignant neoplasm of upper-outer quadrant of left female breast: Secondary | ICD-10-CM

## 2018-04-27 DIAGNOSIS — C50612 Malignant neoplasm of axillary tail of left female breast: Secondary | ICD-10-CM

## 2018-04-27 DIAGNOSIS — Z170 Estrogen receptor positive status [ER+]: Principal | ICD-10-CM

## 2018-04-27 DIAGNOSIS — Z853 Personal history of malignant neoplasm of breast: Secondary | ICD-10-CM | POA: Diagnosis not present

## 2018-04-27 DIAGNOSIS — Z9221 Personal history of antineoplastic chemotherapy: Secondary | ICD-10-CM | POA: Diagnosis not present

## 2018-04-27 DIAGNOSIS — R590 Localized enlarged lymph nodes: Secondary | ICD-10-CM

## 2018-04-27 DIAGNOSIS — R591 Generalized enlarged lymph nodes: Secondary | ICD-10-CM

## 2018-04-27 DIAGNOSIS — Z9223 Personal history of estrogen therapy: Secondary | ICD-10-CM | POA: Diagnosis not present

## 2018-04-27 LAB — COMPREHENSIVE METABOLIC PANEL
ALT: 15 U/L (ref 0–44)
AST: 20 U/L (ref 15–41)
Albumin: 4 g/dL (ref 3.5–5.0)
Alkaline Phosphatase: 59 U/L (ref 38–126)
Anion gap: 13 (ref 5–15)
BUN: 15 mg/dL (ref 8–23)
CO2: 32 mmol/L (ref 22–32)
Calcium: 8.8 mg/dL — ABNORMAL LOW (ref 8.9–10.3)
Chloride: 97 mmol/L — ABNORMAL LOW (ref 98–111)
Creatinine, Ser: 1.08 mg/dL — ABNORMAL HIGH (ref 0.44–1.00)
GFR calc Af Amer: >60 mL/min (ref >60)
GFR calc non Af Amer: 53 mL/min — ABNORMAL LOW (ref >60)
Glucose, Bld: 144 mg/dL — ABNORMAL HIGH (ref 70–99)
Potassium: 3.9 mmol/L (ref 3.5–5.1)
Sodium: 142 mmol/L (ref 135–145)
Total Bilirubin: 0.8 mg/dL (ref 0.3–1.2)
Total Protein: 7.8 g/dL (ref 6.5–8.1)

## 2018-04-27 LAB — CBC WITH DIFFERENTIAL (CANCER CENTER ONLY)
Basophils Absolute: 0.1 10*3/uL (ref 0.0–0.1)
Basophils Relative: 1 %
Eosinophils Absolute: 0.3 10*3/uL (ref 0.0–0.5)
Eosinophils Relative: 4 %
HCT: 38 % (ref 34.8–46.6)
Hemoglobin: 12.7 g/dL (ref 11.6–15.9)
Lymphocytes Relative: 16 %
Lymphs Abs: 1.1 10*3/uL (ref 0.9–3.3)
MCH: 31.1 pg (ref 25.1–34.0)
MCHC: 33.4 g/dL (ref 31.5–36.0)
MCV: 93.2 fL (ref 79.5–101.0)
Monocytes Absolute: 0.7 10*3/uL (ref 0.1–0.9)
Monocytes Relative: 10 %
Neutro Abs: 4.8 10*3/uL (ref 1.5–6.5)
Neutrophils Relative %: 69 %
Platelet Count: 169 10*3/uL (ref 145–400)
RBC: 4.08 MIL/uL (ref 3.70–5.45)
RDW: 14.5 % (ref 11.2–14.5)
WBC Count: 6.9 10*3/uL (ref 3.9–10.3)

## 2018-04-27 NOTE — Telephone Encounter (Signed)
Gave patient avs and calendar.   °

## 2018-04-27 NOTE — Progress Notes (Addendum)
ID: Tara [name]   DOB: [birth year]  MR#: [id number]  HYQ#:[id number]  PCP:  [name], MD GYN: SU:  Autumn Messing, MD Other:  Delfin Edis, MD;  Arloa Koh, MD;  Glori Bickers, MD  CHIEF COMPLAINT:    Estrogen receptor positive breast cancer   CURRENT TREATMENT: Observation   HISTORY OF PRESENT ILLNESS:  from the original intake note:  Tara [name] noted a mass in her left breast and brought it to her physician's attention in [place], where she was residing. She was set up for mammography 06/24/2011 at [facility] in [place], [place]. This showed a suspicious abnormality in the left breast measuring approximately 10 mm. Physical examination showed a 1 cm firm superficial mass in the axillary region of the breast and ultrasound showed this to be 11 mm and spiculated.  Biopsy was performed June 25, 2011, and showed ([id number] at the [facility]) and invasive ductal carcinoma, grade 3, estrogen receptor 95% positive, progesterone receptor 40% positive, with an MIB-1 of 29%. The tumor is HER 2 positive by FISH with a ratio of 2.5. There is tumor heterogeneity noted.  With this information the case was presented Jul 08, 2011 at the multidisciplinary  breast cancer clinic. She proceeded to definitive left lumpectomy and sentinel lymph node sampling 08/03/2011. This confirmed a 1.6 cm invasive ductal carcinoma, grade 3. Margins were ample. The two sentinel lymph nodes were clear.   Her subsequent history is as detailed below.  INTERVAL HISTORY: Tara [name] returns today for her continued evaluation of asymptomatic lymphadenopathy.  Note that she had back pain about a year or so back and CT noted lymphadenopathy.  She is asymptomatic in regards to weight loss, lab changes, night sweats, or any other symptoms.  She underwent repeat CT chest prior to her appointment today which demonstrated similar thoracic and upper abdominal adenopathy.    Tara [name] has undergone harvoni treatment  for hepatitis c.  She tolerated it well and has been identified as cured.    REVIEW OF SYSTEMS: Tara [name] says her energy level is better than it was this time last year.  She exercises twice per week at the gym on a treadmill and recumbent bike.  She continues to deal with this back pain that prompted all of this.  She is on multiple medications such as gabapentin, cymbalta and Norco which hasn't really helped.  She has been referred to a pain specialist.    Tara [name] has not had any fevers or chills.  She denies any breast changes.  She is without chest pain, palpitations, cough, or shortness of breath.  She is having normal bowel movements and urination.  A detailed ROS was otherwise non contributory.    PAST MEDICAL HISTORY: Past Medical History:  Diagnosis Date  . Anxiety   . Arthritis   . Asthma   . Blood transfusion   . Breast cancer ([place]) 2012   left breast  . Chronic edema   . Complication of anesthesia    difficulty waking up/dizzy/lightheaded  . Dysrhythmia    irregular heartbeat, takes digoxin  . Environmental allergies   . H/O varicella   . H/O varicose veins   . Hepatitis C   . History of measles, mumps, or rubella   . Hx of radiation therapy 12/15/11 - 01/29/12   left breast  . Hypothyroidism   . Shortness of breath   . Thrombocytopenia, primary ([place])   . TTP (thrombotic thrombocytopenic purpura) ([place])    PAST SURGICAL HISTORY: Past Surgical  History:  Procedure Laterality Date  . BREAST LUMPECTOMY  08/03/11   left lumpectomy and slnbx,T1cN0,triple pos  . elbow pins Left   . FOOT SURGERY Left    multiple  . PORTACATH PLACEMENT  08/03/2011   Procedure: INSERTION PORT-A-CATH;  Surgeon: Merrie Roof, MD;  Location: [facility];  Service: General;  Laterality: Right;  . TOTAL HIP ARTHROPLASTY Bilateral     FAMILY HISTORY Family History  Problem Relation Age of Onset  . Pneumonia Mother   . COPD Father   . Brain cancer Maternal Grandfather   . Alcohol abuse Maternal  Grandfather   . Heart disease Neg Hx   . Hyperlipidemia Neg Hx   . Hypertension Neg Hx   Patient's father died at the age of 34 from emphysema in the setting of tobacco abuse; patient's mother is alive. The patient has one brother and one sister; there is no history of breast or ovarian cancer in the immediate family   Gynecologic history: She is Ross P0 menarche age 43 most recent period July of 2008 she never took hormone replacement   Social History: (Updated 12/04/2012) The patient is a homemaker; she used to work in an office previously. Her husband [name] is a Scientist, research (physical sciences), but currently working a temporary job in [place]. They moved here from [facility] Oct 2012. Her dog's name is Eduard Clos (a dachshund/chihuahua mix)   ADVANCED DIRECTIVES: her husband is her healthcare power of attorney  HEALTH MAINTENANCE: (Updated 12/04/2012) Social History   Tobacco Use  . Smoking status: Never Smoker  . Smokeless tobacco: Never Used  Substance Use Topics  . Alcohol use: Yes    Alcohol/week: 1.0 standard drinks    Types: 1 Glasses of wine per week    Comment: occ  . Drug use: No     Colonoscopy: Signoidoscopy Aug 2014, Dr. Olevia Perches  PAP: Oct 2014, Dr. Renold Genta  Bone density: November 2014, [facility], Normal  Lipid panel: Oct. 2014, Dr. Renold Genta   Allergies  Allergen Reactions  . Aspirin Other (See Comments)    Pt has a hx of TTP.   . Nsaids Other (See Comments)    Pt has a hx of TTP.     Current Outpatient Medications  Medication Sig Dispense Refill  . acetaminophen (TYLENOL) 500 MG tablet Take 500 mg by mouth every 6 (six) hours as needed for mild pain, moderate pain or headache.     . ADVAIR DISKUS 250-50 MCG/DOSE AEPB INHALE ONE PUFF BY MOUTH TWICE A DAY 60 each 2  . albuterol (PROVENTIL HFA;VENTOLIN HFA) 108 (90 BASE) MCG/ACT inhaler Inhale 2 puffs into the lungs every 6 (six) hours as needed for wheezing or shortness of breath.     . cholecalciferol (VITAMIN D) 1000 UNITS tablet Take 3,000  Units by mouth daily.     . cyclobenzaprine (FLEXERIL) 10 MG tablet Take 1 tablet (10 mg total) by mouth 3 (three) times daily as needed for muscle spasms. 30 tablet 2  . DIGOX 250 MCG tablet TAKE 1 TABLET BY MOUTH DAILY 90 tablet 2  . DULoxetine (CYMBALTA) 60 MG capsule TAKE ONE CAPSULE BY MOUTH DAILY 30 capsule 0  . fluticasone (FLONASE) 50 MCG/ACT nasal spray Place 2 sprays into both nostrils daily as needed for rhinitis.    Marland Kitchen gabapentin (NEURONTIN) 300 MG capsule Take 1 capsule (300 mg total) by mouth 2 (two) times daily. 60 capsule 5  . glucosamine-chondroitin 500-400 MG tablet Take 1 tablet by mouth daily.     Marland Kitchen  HYDROcodone-acetaminophen (NORCO) 5-325 MG tablet Take 1 tablet by mouth every 6 (six) hours as needed for moderate pain. 20 tablet 0  . levofloxacin (LEVAQUIN) 500 MG tablet Take 1 tablet (500 mg total) by mouth daily. 10 tablet 0  . levothyroxine (SYNTHROID, LEVOTHROID) 88 MCG tablet TAKE ONE TABLET BY MOUTH DAILY 90 tablet 1  . magnesium oxide (MAG-OX) 400 MG tablet Take 400 mg by mouth daily.    . metFORMIN (GLUCOPHAGE) 500 MG tablet TAKE ONE TABLET BY MOUTH TWO TIMES A DAY WITH A MEAL 180 tablet 0  . montelukast (SINGULAIR) 10 MG tablet TAKE ONE TABLET (10 MG) BY MOUTH AT BEDTIME. 90 tablet 3  . Multiple Vitamin (MULTIVITAMIN WITH MINERALS) TABS tablet Take 1 tablet by mouth daily.    . ondansetron (ZOFRAN ODT) 4 MG disintegrating tablet 67m ODT q4 hours prn nausea/vomit 20 tablet 0  . potassium chloride SA (K-DUR,KLOR-CON) 20 MEQ tablet Take 1 tablet (20 mEq total) by mouth daily. 90 tablet 3  . predniSONE (DELTASONE) 10 MG tablet Take in tapering course as directed 6-5-4-3-2-1 21 tablet 0  . torsemide (DEMADEX) 20 MG tablet TAKE ONE TABLET BY MOUTH EVERY DAY. 90 tablet 0  . vitamin B-12 (CYANOCOBALAMIN) 1000 MCG tablet Take 2,000 mcg by mouth daily.     No current facility-administered medications for this visit.     OBJECTIVE: Vitals:   04/27/18 1105  BP: (!) 114/94   Pulse: 92  Resp: 14  Temp: 97.8 F (36.6 C)  SpO2: 95%     Body mass index is 38.2 kg/m.    ECOG FS: 1 Filed Weights   04/27/18 1105  Weight: 243 lb 14.4 oz (110.6 kg)  GENERAL: Patient is a well appearing female in no acute distress HEENT:  Sclerae anicteric.  Oropharynx clear and moist. No ulcerations or evidence of oropharyngeal candidiasis. Neck is supple.  NODES:  No cervical, supraclavicular, or axillary lymphadenopathy palpated.  BREAST EXAM: No suspicious lesion in either breast, no nodules, masses, or sign of recurrence LUNGS:  Clear to auscultation bilaterally.  No wheezes or rhonchi. HEART:  Regular rate and rhythm. No murmur appreciated. ABDOMEN:  Soft, nontender.  Positive, normoactive bowel sounds. No organomegaly palpated. MSK:  No focal spinal tenderness to palpation. Full range of motion bilaterally in the upper extremities. EXTREMITIES:  No peripheral edema.   SKIN:  Clear with no obvious rashes or skin changes. No nail dyscrasia. NEURO:  Nonfocal. Well oriented.  Appropriate affect.    LAB RESULTS: Lab Results  Component Value Date   WBC 21.3 (H) 03/18/2018   NEUTROABS 19.7 (H) 03/18/2018   HGB 14.8 03/18/2018   HCT 45.1 03/18/2018   MCV 95.8 03/18/2018   PLT 154 03/18/2018      Chemistry      Component Value Date/Time   NA 142 04/27/2018 0949   NA 136 04/20/2017 1422   K 3.9 04/27/2018 0949   K 4.0 04/20/2017 1422   CL 97 (L) 04/27/2018 0949   CL 108 (H) 12/05/2012 0953   CO2 32 04/27/2018 0949   CO2 28 04/20/2017 1422   BUN 15 04/27/2018 0949   BUN 19.8 04/20/2017 1422   CREATININE 1.08 (H) 04/27/2018 0949   CREATININE 0.92 03/24/2018 1044   CREATININE 1.0 04/20/2017 1422      Component Value Date/Time   CALCIUM 8.8 (L) 04/27/2018 0949   CALCIUM 9.0 04/20/2017 1422   ALKPHOS 59 04/27/2018 0949   ALKPHOS 81 04/20/2017 1422   AST 20 04/27/2018  0949   AST 32 04/20/2017 1422   ALT 15 04/27/2018 0949   ALT 32 (H) 04/21/2017 1120   ALT 35  04/20/2017 1422   BILITOT 0.8 04/27/2018 0949   BILITOT 0.78 04/20/2017 1422      STUDIES: Ct Chest W Contrast  Result Date: 04/13/2018 CLINICAL DATA:  Left-sided breast cancer diagnosed 2/11 with left lumpectomy. Chemotherapy and radiation therapy completed in 2012. Left lateral flank pain. Clinical history also describes lymphoma. EXAM: CT CHEST WITH CONTRAST TECHNIQUE: Multidetector CT imaging of the chest was performed during intravenous contrast administration. CONTRAST:  26m OMNIPAQUE IOHEXOL 300 MG/ML  SOLN COMPARISON:  03/25/2017 FINDINGS: Cardiovascular: Aortic atherosclerosis. Mild cardiomegaly, accentuated by a pectus excavatum deformity. No pericardial effusion. No central pulmonary embolism, on this non-dedicated study. Left brachiocephalic venous insufficiency with resultant collaterals draining to the azygos system. Mediastinum/Nodes: No supraclavicular adenopathy. A spiculated left axillary soft tissue density measures 1.7 x 1.6 cm on 55/2 and is similar to the prior exam (when remeasured). This could represent postoperative scarring. Prominent middle mediastinal nodes are again identified. Index right paratracheal node measures 1.2 cm on 38/2 versus 1.3 cm on the prior. No hilar adenopathy. No internal mammary adenopathy. Lungs/Pleura: No pleural fluid. Right lower lobe dependent ground-glass in density with central soft tissue density of 4 mm. Example 106/5. This is new. 2 mm subpleural left lower lobe pulmonary nodule on 90/5, similar. Upper Abdomen: Well-circumscribed low-density liver lesions are likely cysts. Normal imaged portions of the spleen, stomach, pancreas, gallbladder, adrenal glands. Low-density left renal lesions are likely cysts. Index porta hepatis node of 1.5 cm on 145/2 is similar 1.4 cm on the prior. More posterior portacaval node measures 2.0 cm on 148/2 versus 1.9 cm on the prior exam (when remeasured). Musculoskeletal: Left breast skin thickening is likely related  to prior radiation therapy. Radiographic marker projects lateral to the left posterior twelfth rib. No underlying soft tissue or osseous lesion identified. Mild convex right thoracic spine curvature. IMPRESSION: 1. Relatively similar thoracic and upper abdominal adenopathy. Stability favors a benign reactive etiology. Indolent lymphoproliferative process cannot be excluded. 2. No evidence of metastatic disease from patient's breast cancer. 3.  Aortic Atherosclerosis (ICD10-I70.0). 4. Subtle right lower lobe ground-glass and soft tissue density. Correlate with any infectious symptoms. Presuming the patient is asymptomatic, this could simply represent subsegmental atelectasis. Recommend attention on follow-up. Electronically Signed   By: KAbigail MiyamotoM.D.   On: 04/13/2018 13:25     ASSESSMENT: 62y.o.  [place]  1. Status post left lumpectomy and sentinel lymph node sampling 08/03/2011 for a T1c N0 (Stage IA) invasive ductal carcinoma, high-grade, triple positive.   2. treated in the adjuvant setting with 4 doses of docetaxel and cyclophosphamide completed 11/16/2011 given along with trastuzumab  3. Trastuzumab continued to total of one year (to December 2013)  4. Status post radiation therapy, completed late May of 2013.  5. began on anastrozole in early June 2013. Discontinued in September 2013 due to intolerance (rash). Started on letrozole in October 2013, completing 5 years May 2017  [facility]  [facility] while    PLAN: MPegeenis doing well today.  She sounds like she is slightly improved from this time last year.  Her hepatitis c is cured, and her energy is improved.  She has no sign of breast cancer recurrence.  She met with Dr. MJana Hakimtoday to review  a plan regarding the lymphadenopathy.  [facility] will undergo a PET scan to further evaluate the enlarged lymph noes, and return on  05/12/18 for [name] to review the results with her.    She knows to call for any questions or concerns prior to her next appointment with [name].    A total of (30) minutes of face-to-face time was spent with this patient with greater than 50% of that time in counseling and care-coordination.  Scot Dock, NP    04/27/2018    ADDENDUM: Darlisa is now 6-1/2 years out from definitive surgery for her breast cancer with no evidence of breast cancer recurrence.  This is very favorable.  I do not know what the source of her lymphadenopathy is.  It is persistent but not progressive.  It may be benign.  I suppose it might be remotely related to her hepatitis C but that is now no longer an issue.  We are going to obtain a PET scan on 05/09/2018 for evaluate this further.  If there is a particularly "hot" nodule we will try to biopsy it or better yet remove it or at least remove a large portion of it for pathology.  Otherwise we will continue to observe as before.  She is agreeable to this plan.  I personally saw this patient and performed a substantive portion of this encounter with the listed APP documented above.   Chauncey Cruel, MD Medical Oncology and Hematology [facility] [address] [place],  [address] Tel. [phone]    Fax. [phone]

## 2018-04-28 ENCOUNTER — Telehealth: Payer: Self-pay

## 2018-04-28 NOTE — Telephone Encounter (Signed)
Pt contacted informing of normal cbc results and petscan 05/09/18 at 1 pm.  Pt voiced understanding. No questions/concerns at this time.

## 2018-05-03 ENCOUNTER — Other Ambulatory Visit: Payer: Self-pay | Admitting: Internal Medicine

## 2018-05-05 ENCOUNTER — Ambulatory Visit
Admission: RE | Admit: 2018-05-05 | Discharge: 2018-05-05 | Disposition: A | Discharge status: Home / Self Care | Payer: BLUE CROSS/BLUE SHIELD | Source: Ambulatory Visit | Attending: Internal Medicine | Admitting: Internal Medicine

## 2018-05-05 ENCOUNTER — Other Ambulatory Visit: Payer: Self-pay | Admitting: Oncology

## 2018-05-05 DIAGNOSIS — K717 Toxic liver disease with fibrosis and cirrhosis of liver: Secondary | ICD-10-CM

## 2018-05-06 ENCOUNTER — Other Ambulatory Visit: Payer: Self-pay | Admitting: *Deleted

## 2018-05-06 DIAGNOSIS — Z029 Encounter for administrative examinations, unspecified: Secondary | ICD-10-CM

## 2018-05-09 ENCOUNTER — Encounter (HOSPITAL_COMMUNITY): Payer: BLUE CROSS/BLUE SHIELD

## 2018-05-09 ENCOUNTER — Other Ambulatory Visit: Payer: Self-pay | Admitting: Internal Medicine

## 2018-05-09 ENCOUNTER — Telehealth: Payer: Self-pay | Admitting: Adult Health

## 2018-05-09 NOTE — Telephone Encounter (Signed)
Called [facility] for peer to peer for Norlene Duel PET-CT.  Smoke with Efrrain C. For 6 minutes, then Ed Blalock, Firefighter.  Then spoke with MD, (on hold for about 7 minutes time on call 3 minutes).  Reviewed patients previous non diagnostic CT scans, which cannot determine etiology of non specific lymphadenopathy.  PET-CT approved Number is [id number] through 11/05/2018.  Wilber Bihari, NP

## 2018-05-12 ENCOUNTER — Ambulatory Visit: Payer: BLUE CROSS/BLUE SHIELD | Admitting: Adult Health

## 2018-05-16 ENCOUNTER — Ambulatory Visit (HOSPITAL_COMMUNITY)
Admission: RE | Admit: 2018-05-16 | Discharge: 2018-05-16 | Disposition: A | Discharge status: Home / Self Care | Payer: BLUE CROSS/BLUE SHIELD | Source: Ambulatory Visit | Attending: Adult Health | Admitting: Adult Health

## 2018-05-16 DIAGNOSIS — R590 Localized enlarged lymph nodes: Secondary | ICD-10-CM | POA: Diagnosis not present

## 2018-05-16 DIAGNOSIS — I700 Atherosclerosis of aorta: Secondary | ICD-10-CM | POA: Diagnosis not present

## 2018-05-16 LAB — GLUCOSE, CAPILLARY: Glucose-Capillary: 132 mg/dL — ABNORMAL HIGH (ref 70–99)

## 2018-05-16 MED ORDER — FLUDEOXYGLUCOSE F - 18 (FDG) INJECTION
13.1400 | Freq: Once | INTRAVENOUS | Status: AC | PRN
  Administered 2018-05-16: 13.14 via INTRAVENOUS

## 2018-05-17 ENCOUNTER — Telehealth: Payer: Self-pay | Admitting: Adult Health

## 2018-05-17 NOTE — Telephone Encounter (Signed)
Called patient twice today to review PET scan results.  Left message both times.    Wilber Bihari, NP

## 2018-05-24 ENCOUNTER — Other Ambulatory Visit: Payer: Self-pay | Admitting: Internal Medicine

## 2018-05-29 ENCOUNTER — Encounter: Payer: Self-pay | Admitting: Oncology

## 2018-05-29 ENCOUNTER — Other Ambulatory Visit: Payer: Self-pay | Admitting: Oncology

## 2018-05-30 ENCOUNTER — Other Ambulatory Visit: Payer: Self-pay | Admitting: Internal Medicine

## 2018-06-06 ENCOUNTER — Ambulatory Visit: Payer: BLUE CROSS/BLUE SHIELD | Admitting: Oncology

## 2018-06-12 NOTE — Progress Notes (Signed)
Referring Provider: Thayer Headings, MD Primary Care Physician:  [name], MD  Reason for Consultation:  Advanced fibrosis   IMPRESSION:  Advanced fibrosis due to history of HCV +/- steatosis    - Elastography 04/28/2017: median velocity of 3.32 m/sec, IQR 0.25, IQR/Median velocity ratio of 0.08           - corresponding to a fibrosis score of F3 to F4.    - has had the seasonal flu and the Pneumovax History of genotype 1a HCV with VL 1,370,000    - incomplete treatment with interferon due to associated TTP    - achieved SVR after 12 weeks of Harvoni  Stable portahepatis LAD Multiple stable liver cysts on prior abdominal imaging Normal, but low normal platelets History of colon polyps    - adenoma in 2007 on colonoscopy in [place]    - flexible sigmoidoscopy with [name] 2014 History of Schatzki's ring s/p dilation 2001  History of T1cN0 breast cancer  Advanced fibrosis noted on elastography prior to eradication of hepatitis C. This was a new diagnosis to the patient. Ongoing alcohol use and suspected concurrent fatty liver. Unclear how much her fibrosis has/will improve now that she has achieved sustained virologic response. At this time, she should be monitored for the development of hepatocellular carcinoma and complications with the development of portal hypertension. Will plan abdominal imaging every 6 months.  Given her history of complicated imaging with multiple liver cysts and lymphadenopathy, will alternate liver MRI with liver CT every 6 months.  I have recommended an EGD to screen for varices. Vaccination for HAV and HBV recommended. Maintaining a healthy weight through diet and exercise is critical in the setting of suspected fatty liver. Abstinence from alcohol was recommended, as we discussed the ways that alcohol may accelerate the natural history of concurrent liver disease.   The patient reports 7 discrete episodes of nausea and vomiting over the last year.   There are no uniform features in the presentation making a diagnosis difficult based on history alone. As I have already recommended an EGD to screen for varices, will evaluate for esophagitis, PUD, gastritis, and H pylori at the time of endoscopy.   She is due a surveillance colonoscopy for her history of adenoma. Will pan colonoscopy at the time of EGD.   PLAN: Labs to include: CMP, CBC, INR, AFP to allow calculation of a MELD score EGD to screen for varices Colonoscopy given a history of colon polyps Vaccinations for HAV and HBV recommended (Twinrix) Abdominal imaging every 6 months - MRI March 2020, CT September 2020 Follow-up in the office every 6 months Abstinence from alcohol Work to maintain a healthy weight through diet and regular exercise Screening for vitamin D deficiency recommended if this has not already been performed  I consented the patient at the bedside today discussing the risks, benefits, and alternatives to endoscopic evaluation. In particular, we discussed the risks that include, but are not limited to, reaction to medication, cardiopulmonary compromise, bleeding requiring blood transfusion, aspiration resulting in pneumonia, perforation requiring surgery, and even death. We reviewed the risk of missed lesion including polyps or even cancer. The patient acknowledges these risks and asks that we proceed.   HPI: Tara [name] is a 62 y.o. female seen in consultation at the request of Dr. Linus Salmons for further evaluation of cirrhosis.  History is obtained through the patient and review of her electronic health record. Her husband accompanies her to this appointment. She achieved  SVR of genotype 1 a hepatitis C with 12 weeks Harvoni under the guidance of Dr. Linus Salmons. VL was 1,370,000. Has not wanted HAV or HBV vaccines. Elastography with F3/F4 fibrosis. Serial imaging studies have shown benign, stable cysts and some LAD. She has been receiving [place] surveillance every 6 months. WIth  concerns for cirrhosis, he has referred her to GI to consider screening for varices. She continues to have fatigue and requires frequent naps despite irradication of hepatitis C. No other symptoms associated with her liver disease. No identified exacerbating or relieving features.   The patient has a history of T1cN0 (stage 1A) invasive ductal carcinoma diagnosed in October 2012.  She had a left lumpectomy and sentinel lymph node sampling 2012, adjuvant chemotherapy with 4 dose of docetaxel/cyclophohsphamide 10/2011, one year of trastuzumab, and radiation therapy in 2013. She was treated with 5 years of anastrozole/letrozole.  She has had asymptomatic lymphadenopathy.  She reports 7 episodes of vomiting over the last year. Events are sporadic and unpredictable. Once while spinning with square dancing. Another episode occurred after eating at a restaurant. Once on a day that was particularly hot. No associated abdominal pain. Symptoms resolved without intervention during the same day, but, they have scared her. She would like to know what they are. GI ROS is otherwise negative.   Recent labs: Labs from August 2019 show a sodium 142 creatinine 1.08 albumin 4 AST 20 ALT 15, total protein 7.8, total bilirubin 0.8, white count 6.9, hemoglobin 12.7, platelets 169. No coagulation studies are available  Prior imaging studies: *Elastography 04/28/2017 median velocity of 3.32 m/sec, IQR 0.25, IQR/Median velocity ratio of 0.08 corresponding to a fibrosis score of F3 to F4. *MRI of the abdomen with and without contrast 05/21/2017 showed small scattered T2 hyperintense liver lesions that were thought to be cysts or hematomas.  Mild hepatic steatosis.  Normal spleen.  Enlarged 1.6 cm portacaval node and an enlarged 1.0 cm porta hepatis node. *CT of the abdomen and pelvis with contrast 05/19/2018 showed multiple scattered tiny hypodensities throughout the liver similar in appearance to the prior MRI.  Otherwise normal  liver pancreas and spleen.  Findings of small bowel enteritis. *CT of the chest with contrast 04/13/2018 showed well-circumscribed low-density liver lesions thought to be cysts.  Porta hepatis node of 1.5 cm.  Equal node measuring 2.07c *Right upper quadrant ultrasound 05/05/2018 shows a mildly echogenic liver and multiple small liver cysts.  No mass present. *PET scan 05/16/2018 shows a mildly hypermetabolic porta hepatis lymph node measuring 1.6cm. No other lesions identified.   Prior endoscopic procedures: *[facility] was performed in [facility] for achalasia and underwent dilation with a 60 [place] dilator.  Dysphasia patient had a hernia Schatzki's ring *Flexible sigmoidoscopy was performed by Dr. Elicia Lamp 04/20/2013 for chronic diarrhea or indications noted a history of a tubular adenoma on colonoscopy in 2007 in [place].  At the time of diarrhea stool studies and celiac profile was negative.  The examined mucosa was normal. Random biopsies were also normal.     Past Medical History:  Diagnosis Date  . Anxiety   . Arthritis   . Asthma   . Blood transfusion   . Breast cancer ([place]) 2012   left breast  . Chronic edema   . Complication of anesthesia    difficulty waking up/dizzy/lightheaded  . Dysrhythmia    irregular heartbeat, takes digoxin  . Environmental allergies   . H/O varicella   . H/O varicose veins   .  Hepatitis C   . History of measles, mumps, or rubella   . Hx of radiation therapy 12/15/11 - 01/29/12   left breast  . Hypothyroidism   . Shortness of breath   . Thrombocytopenia, primary ([place])   . TTP (thrombotic thrombocytopenic purpura) ([facility])     Past Surgical History:  Procedure Laterality Date  . BREAST LUMPECTOMY  08/03/11   left lumpectomy and slnbx,T1cN0,triple pos  . elbow pins Left   . FOOT SURGERY Left    multiple  . PORTACATH PLACEMENT  08/03/2011   Procedure: INSERTION PORT-A-CATH;  Surgeon: Merrie Roof, MD;  Location: [facility];  Service: General;  Laterality: Right;  . TOTAL HIP ARTHROPLASTY Bilateral     Prior to Admission medications   Medication Sig Start Date End Date Taking? Authorizing Provider  acetaminophen (TYLENOL) 500 MG tablet Take 500 mg by mouth every 6 (six) hours as needed for mild pain, moderate pain or headache.     [provider]  ADVAIR DISKUS 250-50 MCG/DOSE AEPB INHALE ONE PUFF BY MOUTH TWICE A DAY 05/30/18   [name], MD  albuterol (PROVENTIL HFA;VENTOLIN HFA) 108 (90 BASE) MCG/ACT inhaler Inhale 2 puffs into the lungs every 6 (six) hours as needed for wheezing or shortness of breath.     [provider]  cholecalciferol (VITAMIN D) 1000 UNITS tablet Take 3,000 Units by mouth daily.     [provider]  cyclobenzaprine (FLEXERIL) 10 MG tablet Take 1 tablet (10 mg total) by mouth 3 (three) times daily as needed for muscle spasms. 12/02/16   [name], MD  DIGOX 250 MCG tablet TAKE 1 TABLET BY MOUTH DAILY 12/03/17   [name], MD  DULoxetine (CYMBALTA) 60 MG capsule TAKE ONE CAPSULE BY MOUTH DAILY 05/03/18   [name], MD  fluticasone (FLONASE) 50 MCG/ACT nasal spray Place 2 sprays into both nostrils daily as needed for rhinitis.    [provider]  gabapentin (NEURONTIN) 300 MG capsule Take 1 capsule (300 mg total) by mouth 2 (two) times daily. 03/17/18   [name], MD  glucosamine-chondroitin 500-400 MG tablet Take 1 tablet by mouth daily.     [provider]  HYDROcodone-acetaminophen (NORCO) 5-325 MG tablet Take 1 tablet by mouth every 6 (six) hours as needed for moderate pain. 03/17/18   [name], MD  levofloxacin (LEVAQUIN) 500 MG tablet Take 1 tablet (500 mg total) by mouth daily. 01/28/18   [name], MD  levothyroxine (SYNTHROID, LEVOTHROID) 88 MCG tablet TAKE ONE TABLET BY MOUTH DAILY 05/24/18   [name], MD  magnesium oxide (MAG-OX) 400 MG tablet Take 400 mg by mouth daily.    [provider]   metFORMIN (GLUCOPHAGE) 500 MG tablet TAKE ONE TABLET BY MOUTH TWO TIMES A DAY WITH A MEAL 02/17/18   [name], MD  montelukast (SINGULAIR) 10 MG tablet TAKE ONE TABLET (10 MG) BY MOUTH AT BEDTIME. 05/09/18   [name], MD  Multiple Vitamin (MULTIVITAMIN WITH MINERALS) TABS tablet Take 1 tablet by mouth daily.    [provider]  ondansetron (ZOFRAN ODT) 4 MG disintegrating tablet 4mg  ODT q4 hours prn nausea/vomit 03/18/18   Deno Etienne, DO  potassium chloride SA (K-DUR,KLOR-CON) 20 MEQ tablet Take 1 tablet (20 mEq total) by mouth daily. 11/15/17   [name], MD  predniSONE (DELTASONE) 10 MG tablet Take in tapering course as directed 6-5-4-3-2-1 01/28/18   Tedra Senegal  J, MD  torsemide (DEMADEX) 20 MG tablet TAKE ONE TABLET BY MOUTH EVERY DAY. 05/30/18   [name], MD  vitamin B-12 (CYANOCOBALAMIN) 1000 MCG tablet Take 2,000 mcg by mouth daily.    [provider]    Current Outpatient Medications  Medication Sig Dispense Refill  . acetaminophen (TYLENOL) 500 MG tablet Take 500 mg by mouth every 6 (six) hours as needed for mild pain, moderate pain or headache.     . ADVAIR DISKUS 250-50 MCG/DOSE AEPB INHALE ONE PUFF BY MOUTH TWICE A DAY 60 each 1  . albuterol (PROVENTIL HFA;VENTOLIN HFA) 108 (90 BASE) MCG/ACT inhaler Inhale 2 puffs into the lungs every 6 (six) hours as needed for wheezing or shortness of breath.     . cholecalciferol (VITAMIN D) 1000 UNITS tablet Take 3,000 Units by mouth daily.     . cyclobenzaprine (FLEXERIL) 10 MG tablet Take 1 tablet (10 mg total) by mouth 3 (three) times daily as needed for muscle spasms. 30 tablet 2  . DIGOX 250 MCG tablet TAKE 1 TABLET BY MOUTH DAILY 90 tablet 2  . DULoxetine (CYMBALTA) 60 MG capsule TAKE ONE CAPSULE BY MOUTH DAILY 90 capsule 3  . fluticasone (FLONASE) 50 MCG/ACT nasal spray Place 2 sprays into both nostrils daily as needed for rhinitis.    Marland Kitchen gabapentin (NEURONTIN) 300 MG capsule Take 1 capsule (300 mg total)  by mouth 2 (two) times daily. 60 capsule 5  . glucosamine-chondroitin 500-400 MG tablet Take 1 tablet by mouth daily.     Marland Kitchen HYDROcodone-acetaminophen (NORCO) 5-325 MG tablet Take 1 tablet by mouth every 6 (six) hours as needed for moderate pain. 20 tablet 0  . levofloxacin (LEVAQUIN) 500 MG tablet Take 1 tablet (500 mg total) by mouth daily. 10 tablet 0  . levothyroxine (SYNTHROID, LEVOTHROID) 88 MCG tablet TAKE ONE TABLET BY MOUTH DAILY 90 tablet 1  . magnesium oxide (MAG-OX) 400 MG tablet Take 400 mg by mouth daily.    . metFORMIN (GLUCOPHAGE) 500 MG tablet TAKE ONE TABLET BY MOUTH TWO TIMES A DAY WITH A MEAL 180 tablet 0  . montelukast (SINGULAIR) 10 MG tablet TAKE ONE TABLET (10 MG) BY MOUTH AT BEDTIME. 90 tablet 2  . Multiple Vitamin (MULTIVITAMIN WITH MINERALS) TABS tablet Take 1 tablet by mouth daily.    . ondansetron (ZOFRAN ODT) 4 MG disintegrating tablet 4mg  ODT q4 hours prn nausea/vomit 20 tablet 0  . potassium chloride SA (K-DUR,KLOR-CON) 20 MEQ tablet Take 1 tablet (20 mEq total) by mouth daily. 90 tablet 3  . predniSONE (DELTASONE) 10 MG tablet Take in tapering course as directed 6-5-4-3-2-1 21 tablet 0  . torsemide (DEMADEX) 20 MG tablet TAKE ONE TABLET BY MOUTH EVERY DAY. 90 tablet 0  . vitamin B-12 (CYANOCOBALAMIN) 1000 MCG tablet Take 2,000 mcg by mouth daily.     No current facility-administered medications for this visit.     Allergies as of 06/14/2018 - Review Complete 04/27/2018  Allergen Reaction Noted  . Aspirin Other (See Comments) 07/08/2011  . Nsaids Other (See Comments) 07/21/2011    Family History  Problem Relation Age of Onset  . Pneumonia Mother   . COPD Father   . Brain cancer Maternal Grandfather   . Alcohol abuse Maternal Grandfather   . Heart disease Neg Hx   . Hyperlipidemia Neg Hx   . Hypertension Neg Hx     Social History   Socioeconomic History  . Marital status: Married    Spouse name: Not  on file  . Number of children: Not on file  .  Years of education: Not on file  . Highest education level: Not on file  Occupational History  . Not on file  Social Needs  . Financial resource strain: Not on file  . Food insecurity:    Worry: Not on file    Inability: Not on file  . Transportation needs:    Medical: Not on file    Non-medical: Not on file  Tobacco Use  . Smoking status: Never Smoker  . Smokeless tobacco: Never Used  Substance and Sexual Activity  . Alcohol use: Yes    Alcohol/week: 1.0 standard drinks    Types: 1 Glasses of wine per week    Comment: occ  . Drug use: No  . Sexual activity: Not on file  Lifestyle  . Physical activity:    Days per week: Not on file    Minutes per session: Not on file  . Stress: Not on file  Relationships  . Social connections:    Talks on phone: Not on file    Gets together: Not on file    Attends religious service: Not on file    Active member of club or organization: Not on file    Attends meetings of clubs or organizations: Not on file    Relationship status: Not on file  . Intimate partner violence:    Fear of current or ex partner: Not on file    Emotionally abused: Not on file    Physically abused: Not on file    Forced sexual activity: Not on file  Other Topics Concern  . Not on file  Social History Narrative  . Not on file    Review of Systems: 12 system ROS is negative except as noted above with the addition of back pain.   Physical Exam: @VSRANGES @   Vital signs in last 24 hours: @VSRANGES @   General:   Alert, in NAD. No bilateral temporal wasting. Appears her stated age.  HEENT: No scleral icterus. Symmteric. Neck: No LAD, no thyromegaly Heart:  Regular rate and rhythm; no murmurs Pulm: Clear anteriorly; no wheezing Abdomen:  Soft. Central obesity. Nontender. Nondistended. Normal bowel sounds. No rebound or guarding. No fluid wave.  LAD: No inguinal or umbilical LAD Extremities:  Without edema. Neurologic:  Alert and  oriented x4;  grossly  normal neurologically; no asterixis or clonus. Skin: No jaundice. No palmar erythema or spider angioma.  Psych:  Alert and cooperative. Normal mood and affect.  Thornton Park, MD, MPH [facility]

## 2018-06-14 ENCOUNTER — Ambulatory Visit: Payer: BLUE CROSS/BLUE SHIELD | Admitting: Gastroenterology

## 2018-06-14 ENCOUNTER — Other Ambulatory Visit (INDEPENDENT_AMBULATORY_CARE_PROVIDER_SITE_OTHER): Payer: BLUE CROSS/BLUE SHIELD

## 2018-06-14 ENCOUNTER — Encounter: Payer: Self-pay | Admitting: Gastroenterology

## 2018-06-14 VITALS — BP 110/80 | HR 92 | Ht 67.0 in | Wt 247.3 lb

## 2018-06-14 DIAGNOSIS — B182 Chronic viral hepatitis C: Secondary | ICD-10-CM

## 2018-06-14 DIAGNOSIS — R112 Nausea with vomiting, unspecified: Secondary | ICD-10-CM | POA: Diagnosis not present

## 2018-06-14 DIAGNOSIS — Z86010 Personal history of colonic polyps: Secondary | ICD-10-CM

## 2018-06-14 DIAGNOSIS — R935 Abnormal findings on diagnostic imaging of other abdominal regions, including retroperitoneum: Secondary | ICD-10-CM | POA: Diagnosis not present

## 2018-06-14 DIAGNOSIS — Z23 Encounter for immunization: Secondary | ICD-10-CM

## 2018-06-14 DIAGNOSIS — Z853 Personal history of malignant neoplasm of breast: Secondary | ICD-10-CM

## 2018-06-14 DIAGNOSIS — K7469 Other cirrhosis of liver: Secondary | ICD-10-CM

## 2018-06-14 LAB — CBC WITH DIFFERENTIAL/PLATELET
Basophils Absolute: 0.1 10*3/uL (ref 0.0–0.1)
Basophils Relative: 0.7 % (ref 0.0–3.0)
Eosinophils Absolute: 0.4 10*3/uL (ref 0.0–0.7)
Eosinophils Relative: 5.6 % — ABNORMAL HIGH (ref 0.0–5.0)
HCT: 38.5 % (ref 36.0–46.0)
Hemoglobin: 12.9 g/dL (ref 12.0–15.0)
Lymphocytes Relative: 15.7 % (ref 12.0–46.0)
Lymphs Abs: 1.2 10*3/uL (ref 0.7–4.0)
MCHC: 33.4 g/dL (ref 30.0–36.0)
MCV: 93 fl (ref 78.0–100.0)
Monocytes Absolute: 0.7 10*3/uL (ref 0.1–1.0)
Monocytes Relative: 9.8 % (ref 3.0–12.0)
Neutro Abs: 5 10*3/uL (ref 1.4–7.7)
Neutrophils Relative %: 68.2 % (ref 43.0–77.0)
Platelets: 163 10*3/uL (ref 150.0–400.0)
RBC: 4.14 Mil/uL (ref 3.87–5.11)
RDW: 14.5 % (ref 11.5–15.5)
WBC: 7.4 10*3/uL (ref 4.0–10.5)

## 2018-06-14 LAB — COMPREHENSIVE METABOLIC PANEL
ALT: 17 U/L (ref 0–35)
AST: 14 U/L (ref 0–37)
Albumin: 4.4 g/dL (ref 3.5–5.2)
Alkaline Phosphatase: 52 U/L (ref 39–117)
BUN: 12 mg/dL (ref 6–23)
CO2: 31 mEq/L (ref 19–32)
Calcium: 8.6 mg/dL (ref 8.4–10.5)
Chloride: 100 mEq/L (ref 96–112)
Creatinine, Ser: 0.89 mg/dL (ref 0.40–1.20)
GFR: 67.92 mL/min (ref >60.00)
Glucose, Bld: 123 mg/dL — ABNORMAL HIGH (ref 70–99)
Potassium: 4.3 mEq/L (ref 3.5–5.1)
Sodium: 140 mEq/L (ref 135–145)
Total Bilirubin: 0.7 mg/dL (ref 0.2–1.2)
Total Protein: 7.8 g/dL (ref 6.0–8.3)

## 2018-06-14 LAB — PROTIME-INR
INR: 1 ratio (ref 0.8–1.0)
Prothrombin Time: 11.4 s (ref 9.6–13.1)

## 2018-06-14 NOTE — Patient Instructions (Addendum)
Your provider has requested that you go to the basement level for lab work before leaving today. Press "B" on the elevator. The lab is located at the first door on the left as you exit the elevator.  You have been scheduled for an endoscopy and colonoscopy. Please follow the written instructions given to you at your visit today. Please pick up your prep supplies at the pharmacy within the next 1-3 days. If you use inhalers (even only as needed), please bring them with you on the day of your procedure. Your physician has requested that you go to www.startemmi.com and enter the access code given to you at your visit today. This web site gives a general overview about your procedure. However, you should still follow specific instructions given to you by our office regarding your preparation for the procedure.  We have given you the vacination today for Hepatitis A/B. Your next injection is due on 07-14-18 at 10:30 am.   You will be due for a recall ultrasound in March 2020 and September 2020. We will send you a reminder in the mail when it gets closer to that time.  Abstain from alcohol.   Work to maintain a healthy weight through diet and regular exercise.

## 2018-06-15 LAB — AFP TUMOR MARKER: AFP-Tumor Marker: 4.8 ng/mL

## 2018-06-20 ENCOUNTER — Encounter: Payer: Self-pay | Admitting: Gastroenterology

## 2018-06-20 ENCOUNTER — Ambulatory Visit (AMBULATORY_SURGERY_CENTER): Payer: BLUE CROSS/BLUE SHIELD | Admitting: Gastroenterology

## 2018-06-20 VITALS — BP 120/68 | HR 80 | Temp 96.4°F | Resp 18 | Ht 67.0 in | Wt 247.0 lb

## 2018-06-20 DIAGNOSIS — Z86010 Personal history of colonic polyps: Secondary | ICD-10-CM | POA: Diagnosis not present

## 2018-06-20 DIAGNOSIS — K635 Polyp of colon: Secondary | ICD-10-CM

## 2018-06-20 DIAGNOSIS — D125 Benign neoplasm of sigmoid colon: Secondary | ICD-10-CM | POA: Diagnosis not present

## 2018-06-20 DIAGNOSIS — B182 Chronic viral hepatitis C: Secondary | ICD-10-CM

## 2018-06-20 DIAGNOSIS — K2970 Gastritis, unspecified, without bleeding: Secondary | ICD-10-CM

## 2018-06-20 DIAGNOSIS — D123 Benign neoplasm of transverse colon: Secondary | ICD-10-CM

## 2018-06-20 MED ORDER — SODIUM CHLORIDE 0.9 % IV SOLN
500.0000 mL | Freq: Once | INTRAVENOUS | Status: DC
Start: 2018-06-20 — End: 2018-07-11

## 2018-06-20 NOTE — Progress Notes (Signed)
Pt's states no medical or surgical changes since previsit or office visit. 

## 2018-06-20 NOTE — Patient Instructions (Signed)
YOU HAD AN ENDOSCOPIC PROCEDURE TODAY AT THE [facility]:   Refer to the procedure report that was given to you for any specific questions about what was found during the examination.  If the procedure report does not answer your questions, please call your gastroenterologist to clarify.  If you requested that your care partner not be given the details of your procedure findings, then the procedure report has been included in a sealed envelope for you to review at your convenience later.  YOU SHOULD EXPECT: Some feelings of bloating in the abdomen. Passage of more gas than usual.  Walking can help get rid of the air that was put into your GI tract during the procedure and reduce the bloating. If you had a lower endoscopy (such as a colonoscopy or flexible sigmoidoscopy) you may notice spotting of blood in your stool or on the toilet paper. If you underwent a bowel prep for your procedure, you may not have a normal bowel movement for a few days.  Please Note:  You might notice some irritation and congestion in your nose or some drainage.  This is from the oxygen used during your procedure.  There is no need for concern and it should clear up in a day or so.  SYMPTOMS TO REPORT IMMEDIATELY:   Following lower endoscopy (colonoscopy or flexible sigmoidoscopy):  Excessive amounts of blood in the stool  Significant tenderness or worsening of abdominal pains  Swelling of the abdomen that is new, acute  Fever of 100F or higher   Following upper endoscopy (EGD)  Vomiting of blood or coffee ground material  New chest pain or pain under the shoulder blades  Painful or persistently difficult swallowing  New shortness of breath  Fever of 100F or higher  Black, tarry-looking stools  For urgent or emergent issues, a gastroenterologist can be reached at any hour by calling [phone].   DIET:  We do recommend a small meal at first, but then you may proceed to your regular diet.  Drink  plenty of fluids but you should avoid alcoholic beverages for 24 hours.  MEDICATIONS: Continue present medications.  Please see handouts given to you by your recovery nurse.  ACTIVITY:  You should plan to take it easy for the rest of today and you should NOT DRIVE or use heavy machinery until tomorrow (because of the sedation medicines used during the test).    FOLLOW UP: Our staff will call the number listed on your records the next business day following your procedure to check on you and address any questions or concerns that you may have regarding the information given to you following your procedure. If we do not reach you, we will leave a message.  However, if you are feeling well and you are not experiencing any problems, there is no need to return our call.  We will assume that you have returned to your regular daily activities without incident.  If any biopsies were taken you will be contacted by phone or by letter within the next 1-3 weeks.  Please call us at [phone] if you have not heard about the biopsies in 3 weeks.   Thank you for allowing us to provide for your healthcare needs today.   SIGNATURES/CONFIDENTIALITY: You and/or your care partner have signed paperwork which will be entered into your electronic medical record.  These signatures attest to the fact that that the information above on your After Visit Summary has been reviewed and is   understood.  Full responsibility of the confidentiality of this discharge information lies with you and/or your care-partner. 

## 2018-06-20 NOTE — Progress Notes (Signed)
Report to PACU, RN, vss, BBS= Clear.  

## 2018-06-20 NOTE — Op Note (Addendum)
[place] Patient Name: Aliannah Holstrom Procedure Date: 06/20/2018 9:03 AM MRN: [id number] Endoscopist: Thornton Park MD, MD Age: 62 Referring MD:  Date of Birth: November 07, 1955 Gender: Female Account #: [id number] Procedure:                Colonoscopy Indications:              Surveillance: Personal history of adenomatous                            polyps on colonoscopy in [place] in 2007. Normal                            flexible sigmoidoscopy in 2014. Medicines:                See the Anesthesia note for documentation of the                            administered medications Procedure:                Pre-Anesthesia Assessment:                           - Prior to the procedure, a History and Physical                            was performed, and patient medications and                            allergies were reviewed. The patient's tolerance of                            previous anesthesia was also reviewed. The risks                            and benefits of the procedure and the sedation                            options and risks were discussed with the patient.                            All questions were answered, and informed consent                            was obtained. Prior Anticoagulants: The patient has                            taken no previous anticoagulant or antiplatelet                            agents. ASA Grade Assessment: III - A patient with                            severe systemic disease. After reviewing the risks  and benefits, the patient was deemed in                            satisfactory condition to undergo the procedure.                           After obtaining informed consent, the colonoscope                            was passed under direct vision. Throughout the                            procedure, the patient's blood pressure, pulse, and                            oxygen saturations were  monitored continuously. The                            Colonoscope was introduced through the anus and                            advanced to the the terminal ileum, with                            identification of the appendiceal orifice and IC                            valve. The colonoscopy was unusually difficult due                            to a redundant colon, significant looping and a                            tortuous colon. Successful completion of the                            procedure was aided by applying abdominal pressure.                            The patient tolerated the procedure well. The                            quality of the bowel preparation was good. Scope In: 9:14:09 AM Scope Out: 9:54:09 AM Scope Withdrawal Time: 0 hours 28 minutes 15 seconds  Total Procedure Duration: 0 hours 40 minutes 0 seconds  Findings:                 The perianal and digital rectal examinations were                            normal.                           A 5 mm polyp was found in the sigmoid colon. The  polyp was sessile. The polyp was removed with a                            cold snare. Resection and retrieval were complete.                           A 35 mm polyp was found in the splenic flexure in                            an area of considerable looping and redundancy.                            Maintaining position for the polypectomy was                            challenging. The polyp was flat and multilobulated.                            Area was successfully injected with 8 mL saline for                            lesion assessment, and this injection appeared to                            lift the lesion adequately. Area was tattooed with                            an injection of 4 mL of [place] ink. The polyp was                            then removed in a piecemeal fashion using a hot                            snare. The area  became so distorted given the                            number of pieces, that it was unclear if the                            margins were clear at the end of the procedure.                            Some oozing occurred during the procedure but there                            was not active bleeding at the conclusion of the                            procedure. Complications:            No immediate complications. Estimated Blood Loss:     Estimated blood loss was minimal. Impression:               -  One 5 mm polyp in the sigmoid colon, removed with                            a cold snare. Resected and retrieved.                           - One 35 mm polyp at the splenic flexure. Injected.                            Tattooed. Removed in a piecemeal fashion. Recommendation:           - Await pathology results.                           - Resume prior diet.                           - Continue current medications.                           - Repeat colonoscopy in 3 months to review the                            polypectomy site. Use a different prep and plan for                            a 60 minute colonoscopy.                           - Reviewed that she is high risk for                            post-polypectomy bleeding and made clear                            recommendations about when to seek medical                            attention. Thornton Park MD, MD 06/20/2018 10:07:39 AM This report has been signed electronically.

## 2018-06-20 NOTE — Op Note (Signed)
[place] Patient Name: Tara [name] Procedure Date: 06/20/2018 9:03 AM MRN: [id number] Endoscopist: Thornton Park MD, MD Age: 62 Referring MD:  Date of Birth: 04/09/1956 Gender: Female Account #: [id number] Procedure:                Upper GI endoscopy Indications:              Variceal screening (no known varices or prior                            bleeding), Nausea with vomiting; new diagnosis of                            cirrhosis. Medicines:                See the Anesthesia note for documentation of the                            administered medications Procedure:                Pre-Anesthesia Assessment:                           - Prior to the procedure, a History and Physical                            was performed, and patient medications and                            allergies were reviewed. The patient's tolerance of                            previous anesthesia was also reviewed. The risks                            and benefits of the procedure and the sedation                            options and risks were discussed with the patient.                            All questions were answered, and informed consent                            was obtained. Prior Anticoagulants: The patient has                            taken no previous anticoagulant or antiplatelet                            agents. ASA Grade Assessment: III - A patient with                            severe systemic disease. After reviewing the risks  and benefits, the patient was deemed in                            satisfactory condition to undergo the procedure.                           After obtaining informed consent, the endoscope was                            passed under direct vision. Throughout the                            procedure, the patient's blood pressure, pulse, and                            oxygen saturations were monitored  continuously. The                            Model GIF-HQ190 [id number]) scope was introduced                            through the mouth, and advanced to the second part                            of duodenum. The upper GI endoscopy was                            accomplished without difficulty. The patient                            tolerated the procedure well. Scope In: Scope Out: Findings:                 The esophagus was normal. No esophageal varices.                           The entire examined stomach was normal. Biopsies                            were taken with a cold forceps for histology.                            Verification of patient identification for the                            specimen was done. No portal hypertensive                            gastropathy or gastric varices.                           The examined duodenum was normal. Complications:            No immediate complications. Estimated Blood Loss:     Estimated blood loss: none. Impression:               -  Normal esophagus.                           - Normal stomach. Biopsied.                           - Normal examined duodenum. Recommendation:           - Await pathology results.                           - Repeat upper endoscopy per protocol to screen for                            esophageal varices.                           - Continue with colonoscopy today as previously                            planned Thornton Park MD, MD 06/20/2018 9:59:23 AM This report has been signed electronically.

## 2018-06-20 NOTE — Progress Notes (Signed)
Called to room to assist during endoscopic procedure.  Patient ID and intended procedure confirmed with present staff. Received instructions for my participation in the procedure from the performing physician.  

## 2018-06-21 ENCOUNTER — Telehealth: Payer: Self-pay | Admitting: *Deleted

## 2018-06-21 NOTE — Telephone Encounter (Signed)
  Follow up Call-  Call back number 06/20/2018  Post procedure Call Back phone  # [phone]  Permission to leave phone message Yes  Some recent data might be hidden     Patient questions:  Do you have a fever, pain , or abdominal swelling? No. Pain Score  0 *  Have you tolerated food without any problems? Yes.    Have you been able to return to your normal activities? Yes.    Do you have any questions about your discharge instructions: Diet   No. Medications  No. Follow up visit  No.  Do you have questions or concerns about your Care? No.  Actions: * If pain score is 4 or above: No action needed, pain <4.

## 2018-06-23 ENCOUNTER — Other Ambulatory Visit: Payer: Self-pay | Admitting: Internal Medicine

## 2018-06-23 ENCOUNTER — Encounter: Payer: Self-pay | Admitting: Gastroenterology

## 2018-07-01 ENCOUNTER — Other Ambulatory Visit: Payer: Self-pay | Admitting: Internal Medicine

## 2018-07-01 DIAGNOSIS — J4520 Mild intermittent asthma, uncomplicated: Secondary | ICD-10-CM

## 2018-07-01 DIAGNOSIS — J45909 Unspecified asthma, uncomplicated: Secondary | ICD-10-CM

## 2018-07-01 DIAGNOSIS — E039 Hypothyroidism, unspecified: Secondary | ICD-10-CM

## 2018-07-01 DIAGNOSIS — J309 Allergic rhinitis, unspecified: Secondary | ICD-10-CM

## 2018-07-01 DIAGNOSIS — G894 Chronic pain syndrome: Secondary | ICD-10-CM | POA: Insufficient documentation

## 2018-07-01 DIAGNOSIS — F32A Depression, unspecified: Secondary | ICD-10-CM

## 2018-07-01 DIAGNOSIS — K7469 Other cirrhosis of liver: Secondary | ICD-10-CM

## 2018-07-01 DIAGNOSIS — J302 Other seasonal allergic rhinitis: Secondary | ICD-10-CM

## 2018-07-01 DIAGNOSIS — I499 Cardiac arrhythmia, unspecified: Secondary | ICD-10-CM

## 2018-07-01 DIAGNOSIS — F329 Major depressive disorder, single episode, unspecified: Secondary | ICD-10-CM

## 2018-07-01 DIAGNOSIS — R609 Edema, unspecified: Secondary | ICD-10-CM

## 2018-07-01 DIAGNOSIS — R7302 Impaired glucose tolerance (oral): Secondary | ICD-10-CM

## 2018-07-01 DIAGNOSIS — F419 Anxiety disorder, unspecified: Secondary | ICD-10-CM

## 2018-07-01 DIAGNOSIS — L719 Rosacea, unspecified: Secondary | ICD-10-CM

## 2018-07-01 DIAGNOSIS — B182 Chronic viral hepatitis C: Secondary | ICD-10-CM

## 2018-07-01 DIAGNOSIS — K219 Gastro-esophageal reflux disease without esophagitis: Secondary | ICD-10-CM

## 2018-07-01 HISTORY — DX: Chronic pain syndrome: G89.4

## 2018-07-07 NOTE — Addendum Note (Signed)
Addended by: Mady Haagensen on: 07/07/2018 01:04 PM   Modules accepted: Orders

## 2018-07-08 ENCOUNTER — Other Ambulatory Visit (INDEPENDENT_AMBULATORY_CARE_PROVIDER_SITE_OTHER): Payer: BLUE CROSS/BLUE SHIELD | Admitting: Internal Medicine

## 2018-07-08 DIAGNOSIS — R609 Edema, unspecified: Secondary | ICD-10-CM

## 2018-07-08 DIAGNOSIS — J45909 Unspecified asthma, uncomplicated: Secondary | ICD-10-CM

## 2018-07-08 DIAGNOSIS — F419 Anxiety disorder, unspecified: Secondary | ICD-10-CM | POA: Diagnosis not present

## 2018-07-08 DIAGNOSIS — R7302 Impaired glucose tolerance (oral): Secondary | ICD-10-CM | POA: Diagnosis not present

## 2018-07-08 DIAGNOSIS — J302 Other seasonal allergic rhinitis: Secondary | ICD-10-CM

## 2018-07-08 DIAGNOSIS — E039 Hypothyroidism, unspecified: Secondary | ICD-10-CM

## 2018-07-08 DIAGNOSIS — K7469 Other cirrhosis of liver: Secondary | ICD-10-CM

## 2018-07-08 DIAGNOSIS — J4520 Mild intermittent asthma, uncomplicated: Secondary | ICD-10-CM

## 2018-07-08 DIAGNOSIS — F329 Major depressive disorder, single episode, unspecified: Secondary | ICD-10-CM

## 2018-07-08 DIAGNOSIS — J309 Allergic rhinitis, unspecified: Secondary | ICD-10-CM

## 2018-07-08 DIAGNOSIS — B182 Chronic viral hepatitis C: Secondary | ICD-10-CM

## 2018-07-08 DIAGNOSIS — K219 Gastro-esophageal reflux disease without esophagitis: Secondary | ICD-10-CM

## 2018-07-08 DIAGNOSIS — F32A Depression, unspecified: Secondary | ICD-10-CM

## 2018-07-08 DIAGNOSIS — I499 Cardiac arrhythmia, unspecified: Secondary | ICD-10-CM

## 2018-07-08 DIAGNOSIS — L719 Rosacea, unspecified: Secondary | ICD-10-CM

## 2018-07-08 LAB — POCT URINALYSIS DIPSTICK
Appearance: NEGATIVE
Bilirubin, UA: NEGATIVE
Blood, UA: NEGATIVE
Glucose, UA: POSITIVE — AB
Ketones, UA: NEGATIVE
Leukocytes, UA: NEGATIVE
Nitrite, UA: NEGATIVE
Odor: NEGATIVE
Protein, UA: POSITIVE — AB
Spec Grav, UA: 1.015 (ref 1.010–1.025)
Urobilinogen, UA: 0.2 E.U./dL
pH, UA: 6 (ref 5.0–8.0)

## 2018-07-09 LAB — CBC WITH DIFFERENTIAL/PLATELET
Basophils Absolute: 63 cells/uL (ref 0–200)
Basophils Relative: 0.9 %
Eosinophils Absolute: 350 cells/uL (ref 15–500)
Eosinophils Relative: 5 %
HCT: 35.4 % (ref 35.0–45.0)
Hemoglobin: 11.9 g/dL (ref 11.7–15.5)
Lymphs Abs: 1008 cells/uL (ref 850–3900)
MCH: 31 pg (ref 27.0–33.0)
MCHC: 33.6 g/dL (ref 32.0–36.0)
MCV: 92.2 fL (ref 80.0–100.0)
MPV: 12.3 fL (ref 7.5–12.5)
Monocytes Relative: 8.2 %
Neutro Abs: 5005 cells/uL (ref 1500–7800)
Neutrophils Relative %: 71.5 %
Platelets: 156 10*3/uL (ref 140–400)
RBC: 3.84 10*6/uL (ref 3.80–5.10)
RDW: 13.6 % (ref 11.0–15.0)
Total Lymphocyte: 14.4 %
WBC mixed population: 574 cells/uL (ref 200–950)
WBC: 7 10*3/uL (ref 3.8–10.8)

## 2018-07-09 LAB — COMPLETE METABOLIC PANEL WITH GFR
AG Ratio: 1.4 (calc) (ref 1.0–2.5)
ALT: 16 U/L (ref 6–29)
AST: 16 U/L (ref 10–35)
Albumin: 4 g/dL (ref 3.6–5.1)
Alkaline phosphatase (APISO): 45 U/L (ref 33–130)
BUN: 14 mg/dL (ref 7–25)
CO2: 29 mmol/L (ref 20–32)
Calcium: 8.6 mg/dL (ref 8.6–10.4)
Chloride: 103 mmol/L (ref 98–110)
Creat: 0.84 mg/dL (ref 0.50–0.99)
GFR, Est African American: 86 mL/min/{1.73_m2} (ref >=60)
GFR, Est Non African American: 74 mL/min/{1.73_m2} (ref >=60)
Globulin: 2.8 g/dL (calc) (ref 1.9–3.7)
Glucose, Bld: 111 mg/dL — ABNORMAL HIGH (ref 65–99)
Potassium: 5.2 mmol/L (ref 3.5–5.3)
Sodium: 141 mmol/L (ref 135–146)
Total Bilirubin: 0.6 mg/dL (ref 0.2–1.2)
Total Protein: 6.8 g/dL (ref 6.1–8.1)

## 2018-07-09 LAB — MICROALBUMIN / CREATININE URINE RATIO
Creatinine, Urine: 94 mg/dL (ref 20–275)
Microalb Creat Ratio: 10 mcg/mg creat (ref <30)
Microalb, Ur: 0.9 mg/dL

## 2018-07-09 LAB — LIPID PANEL
Cholesterol: 231 mg/dL — ABNORMAL HIGH (ref <200)
HDL: 49 mg/dL — ABNORMAL LOW (ref >50)
LDL Cholesterol (Calc): 149 mg/dL (calc) — ABNORMAL HIGH
Non-HDL Cholesterol (Calc): 182 mg/dL (calc) — ABNORMAL HIGH (ref <130)
Total CHOL/HDL Ratio: 4.7 (calc) (ref <5.0)
Triglycerides: 191 mg/dL — ABNORMAL HIGH (ref <150)

## 2018-07-09 LAB — TSH: TSH: 2.21 mIU/L (ref 0.40–4.50)

## 2018-07-09 LAB — HEMOGLOBIN A1C
Hgb A1c MFr Bld: 6.3 % of total Hgb — ABNORMAL HIGH (ref <5.7)
Mean Plasma Glucose: 134 (calc)
eAG (mmol/L): 7.4 (calc)

## 2018-07-11 ENCOUNTER — Ambulatory Visit (INDEPENDENT_AMBULATORY_CARE_PROVIDER_SITE_OTHER): Payer: BLUE CROSS/BLUE SHIELD | Admitting: Internal Medicine

## 2018-07-11 ENCOUNTER — Encounter: Payer: Self-pay | Admitting: Internal Medicine

## 2018-07-11 VITALS — BP 122/70 | HR 92 | Temp 98.2°F | Ht 67.0 in | Wt 243.0 lb

## 2018-07-11 DIAGNOSIS — Z860101 Personal history of adenomatous and serrated colon polyps: Secondary | ICD-10-CM

## 2018-07-11 DIAGNOSIS — R7302 Impaired glucose tolerance (oral): Secondary | ICD-10-CM

## 2018-07-11 DIAGNOSIS — J4520 Mild intermittent asthma, uncomplicated: Secondary | ICD-10-CM

## 2018-07-11 DIAGNOSIS — E039 Hypothyroidism, unspecified: Secondary | ICD-10-CM

## 2018-07-11 DIAGNOSIS — E119 Type 2 diabetes mellitus without complications: Secondary | ICD-10-CM

## 2018-07-11 DIAGNOSIS — F329 Major depressive disorder, single episode, unspecified: Secondary | ICD-10-CM

## 2018-07-11 DIAGNOSIS — F419 Anxiety disorder, unspecified: Secondary | ICD-10-CM | POA: Diagnosis not present

## 2018-07-11 DIAGNOSIS — M25552 Pain in left hip: Secondary | ICD-10-CM

## 2018-07-11 DIAGNOSIS — R609 Edema, unspecified: Secondary | ICD-10-CM

## 2018-07-11 DIAGNOSIS — F32A Depression, unspecified: Secondary | ICD-10-CM

## 2018-07-11 DIAGNOSIS — Z86010 Personal history of colonic polyps: Secondary | ICD-10-CM

## 2018-07-11 DIAGNOSIS — R0781 Pleurodynia: Secondary | ICD-10-CM

## 2018-07-11 DIAGNOSIS — E782 Mixed hyperlipidemia: Secondary | ICD-10-CM

## 2018-07-11 DIAGNOSIS — M6281 Muscle weakness (generalized): Secondary | ICD-10-CM

## 2018-07-11 DIAGNOSIS — R0789 Other chest pain: Secondary | ICD-10-CM

## 2018-07-11 DIAGNOSIS — J302 Other seasonal allergic rhinitis: Secondary | ICD-10-CM

## 2018-07-11 DIAGNOSIS — Z8619 Personal history of other infectious and parasitic diseases: Secondary | ICD-10-CM

## 2018-07-11 DIAGNOSIS — Z853 Personal history of malignant neoplasm of breast: Secondary | ICD-10-CM

## 2018-07-11 DIAGNOSIS — K219 Gastro-esophageal reflux disease without esophagitis: Secondary | ICD-10-CM

## 2018-07-11 HISTORY — DX: Type 2 diabetes mellitus without complications: E11.9

## 2018-07-11 MED ORDER — SCOPOLAMINE 1 MG/3DAYS TD PT72: 1.0000 | MEDICATED_PATCH | TRANSDERMAL | 12 refills | Status: DC

## 2018-07-11 NOTE — Patient Instructions (Signed)
Please try to walk some for exercise.  See orthopedist regarding left hip pain.  Transderm-Scop patches provided at your request for upcoming cruise.  Please call Dr. Leafy Ro regarding appointment.  Follow-up in 6 months here.  Need to work on diet exercise and weight loss.

## 2018-07-11 NOTE — Progress Notes (Signed)
   Subjective:    Patient ID: Tara [name], female    DOB: 06/05/1956, 62 y.o.   MRN: [id number]  HPI 62 year old Female in today for  evaluation of medical issues. Had CPE in February.  Has established with pain management physician through [facility], Dr. Creig Hines.  Is to have an MRI regarding her thoracic spine pain on the left which is chronic now on long-standing for well over a year or more in the near future.  Work-up done through this office was negative.  She plans to go on a cruise down the [place] in December.Wants transderm scop patches prescribed for possible motion sickness on ship.  Hemoglobin A1c is  6.3% and 6 months ago was 6.1% fasting glucose is 111.  BUN and creatinine are normal.  Potassium 5.2.  Total cholesterol has increased to 231 from 215.  Triglycerides have decreased from 225 to 191.  LDL has increased from 136-149.  Discussion today about diet exercise and weight loss.  Does not seem motivated.  Complaining of some left hip pain.  Needs to see orthopedist       Review of Systems see above     Objective:   Physical Exam Skin warm and dry.  Nodes none.  Neck is supple without JVD thyromegaly or carotid bruits.  Chest clear to auscultation.  Cardiac exam regular rate and rhythm normal S1 and S2.  1+ lower extremity edema which is long-standing.       Assessment & Plan:  Morbid obesity-suggested Dr. Migdalia Dk clinic to her  Impaired glucose tolerance-needs to work on better control.  Is on metformin.  Remote history of breast cancer  History of hepatitis C treated with Harvoni and treatments have ended  Anxiety-takes Cymbalta for anxiety depression  Hypothyroidism-currently on levothyroxine 0.088 mg daily  Dependent edema aggravated by morbid obesity and treated with Demadex  Chronic rib cage pain to be evaluated by pain management physician.  History of asthma treated with inhaler  Bilateral quadriceps weakness  GE  reflux treated with PPI  Plan: Encourage diet exercise and weight loss and follow-up at physical exam May 2019

## 2018-07-15 ENCOUNTER — Ambulatory Visit (INDEPENDENT_AMBULATORY_CARE_PROVIDER_SITE_OTHER): Payer: BLUE CROSS/BLUE SHIELD | Admitting: Gastroenterology

## 2018-07-15 DIAGNOSIS — B1920 Unspecified viral hepatitis C without hepatic coma: Secondary | ICD-10-CM | POA: Diagnosis not present

## 2018-07-15 DIAGNOSIS — Z23 Encounter for immunization: Secondary | ICD-10-CM | POA: Diagnosis not present

## 2018-07-20 ENCOUNTER — Other Ambulatory Visit: Payer: Self-pay | Admitting: Internal Medicine

## 2018-07-24 ENCOUNTER — Encounter: Payer: Self-pay | Admitting: Internal Medicine

## 2018-07-27 ENCOUNTER — Other Ambulatory Visit: Payer: Self-pay | Admitting: Internal Medicine

## 2018-08-29 ENCOUNTER — Ambulatory Visit (INDEPENDENT_AMBULATORY_CARE_PROVIDER_SITE_OTHER): Payer: BLUE CROSS/BLUE SHIELD | Admitting: Orthopaedic Surgery

## 2018-08-29 ENCOUNTER — Ambulatory Visit (INDEPENDENT_AMBULATORY_CARE_PROVIDER_SITE_OTHER): Payer: Self-pay

## 2018-08-29 ENCOUNTER — Encounter (INDEPENDENT_AMBULATORY_CARE_PROVIDER_SITE_OTHER): Payer: Self-pay | Admitting: Orthopaedic Surgery

## 2018-08-29 DIAGNOSIS — M25552 Pain in left hip: Secondary | ICD-10-CM

## 2018-08-29 DIAGNOSIS — M25522 Pain in left elbow: Secondary | ICD-10-CM | POA: Diagnosis not present

## 2018-08-29 DIAGNOSIS — Z96642 Presence of left artificial hip joint: Secondary | ICD-10-CM

## 2018-08-29 DIAGNOSIS — Z96641 Presence of right artificial hip joint: Secondary | ICD-10-CM

## 2018-08-29 HISTORY — DX: Presence of left artificial hip joint: Z96.642

## 2018-08-29 HISTORY — DX: Presence of right artificial hip joint: Z96.641

## 2018-08-29 NOTE — Progress Notes (Signed)
Office Visit Note   Patient: Tara [name]           Date of Birth: July 01, 1956           MRN: [id number] Visit Date: 08/29/2018              Requested by: [name], MD [address] [place], [place] [id number] PCP: [name], MD   Assessment & Plan: Visit Diagnoses:  1. Pain in left hip   2. Pain in left elbow   3. History of left hip replacement   4. History of total right hip replacement     Plan: I gave her reassurance that thus far her hip replacements look good with no complicating features.  I do feel that she is having trochanteric bursitis on the left side.  Certainly outpatient physical therapy can help with this if she decides she like to try that.  All question concerns were answered and addressed.  She understands left elbow should calm down with time in terms of the nerve irritation from where she continues to the elbow.  All question concerns were answered addressed.  If there is any issues at all she will let us know.  Follow-up will otherwise be as needed.  Follow-Up Instructions: Return if symptoms worsen or fail to improve.   Orders:  Orders Placed This Encounter  Procedures  . XR HIP UNILAT W OR W/O PELVIS 1V LEFT  . XR Elbow Complete Left (3+View)   No orders of the defined types were placed in this encounter.     Procedures: No procedures performed   Clinical Data: No additional findings.   Subjective: Chief Complaint  Patient presents with  . Left Hip - Pain  . Left Elbow - Pain  Patient is a very pleasant 62 year old female who is referred to [name] for left hip pain.  She points the lateral aspect of the hip as source of her pain.  She has a Physiological scientist and they wanted this checked out.  She has a history of bilateral hip replacements done in [place].  Her left hip was done in 2005 and her right hip in 2007.  She denies any groin pain.  She also has left elbow pain after a fall a few weeks ago with a burning type of pain.  She  did injure this elbow when she was in her 36s and had surgery on it.  She describes more of a radicular type of pain after landing on that elbow.  She has been able to move it.  Is been more from going situation for her.  She has also had back pain and an epidural recently.  She does state that her left leg is weak in general.  She has to lift it up again to a car.  HPI  Review of Systems She currently denies any headache, chest pain, shortness of breath, fever, chills, nausea, vomiting.  Objective: Vital Signs: There were no vitals taken for this visit.  Physical Exam She is alert and orient x3 and in no acute distress Ortho Exam Examination both hips show the move fluidly with no issues at all.  There is no pain on rotation of both hips.  She has pain to palpation of the trochanteric area on the left side but none on the right.  She has good strength when isolating muscle groups in her lower extremities.  Examination of her left elbow shows no swelling.  She lacks full extension and  full flexion but this is chronic she states.  The elbow feels loosely stable.  She has irritation with palpation over the ulnar nerve.  She has good grip pinch strength distally.  Her intrinsic function is normal. Specialty Comments:  No specialty comments available.  Imaging: Xr Elbow Complete Left (3+view)  Result Date: 08/29/2018 3 views the left elbow show no acute findings.  There is evidence of previous hardware from a remote fracture.  There is posttraumatic arthritic changes in the radiocapitellar joint area of the elbow.  The remainder of the joint space appears well maintained and the alignment well-maintained.  Xr Hip Unilat W Or W/o Pelvis 1v Left  Result Date: 08/29/2018 An AP pelvis and lateral of the left hip shows bilateral total hip arthroplasties with no acute findings or complicating features.  There is no evidence of ostial lysis or polyethylene liner wear.  There is no evidence of  loosening.  The components appear well seated bilaterally.    PMFS History: Patient Active Problem List   Diagnosis Date Noted  . History of left hip replacement 08/29/2018  . History of total right hip replacement 08/29/2018  . Controlled type 2 diabetes mellitus without complication, without long-term current use of insulin ([place]) 07/11/2018  . Cirrhosis ([place]) 05/10/2017  . Gastroesophageal reflux disease 11/05/2015  . Malignant neoplasm of axillary tail of left breast in female, estrogen receptor positive ([place]) 08/12/2015  . Malignant neoplasm of upper-outer quadrant of left breast in female, estrogen receptor positive ([place]) 07/17/2014  . Hypokalemia 12/29/2013  . Allergic rhinitis 10/29/2012  . Asthma 10/29/2012  . Status post bilateral hip replacements 10/29/2012  . History of TTP (thrombotic thrombocytopenic purpura) 10/29/2012  . Chronic hepatitis C without hepatic coma ([place]) 10/29/2012  . Hx of radiation therapy   . Lymphedema of arm 12/15/2011  . Rosacea, acne 09/28/2011  . Dysrhythmia, cardiac 09/28/2011  . Hypothyroidism 09/07/2011   Past Medical History:  Diagnosis Date  . Anxiety   . Arthritis   . Asthma   . Blood transfusion   . Breast cancer ([facility]) 2012   left breast  . Chronic edema   . Complication of anesthesia    difficulty waking up/dizzy/lightheaded  . Dysrhythmia    irregular heartbeat, takes digoxin  . Environmental allergies   . H/O varicella   . H/O varicose veins   . Hepatic cirrhosis ([facility])   . Hepatitis C   . History of measles, mumps, or rubella   . Hx of radiation therapy 12/15/11 - 01/29/12   left breast  . Hypothyroidism   . Shortness of breath   . Thrombocytopenia, primary ([facility])   . TTP (thrombotic thrombocytopenic purpura) ([facility])     Family History  Problem Relation Age of Onset  . Pneumonia Mother   . COPD Mother   . COPD Father   . Brain cancer Maternal Grandfather   . Alcohol abuse Maternal Grandfather   . Heart disease Neg Hx    . Hyperlipidemia Neg Hx   . Hypertension Neg Hx     Past Surgical History:  Procedure Laterality Date  . BREAST LUMPECTOMY  08/03/11   left lumpectomy and slnbx,T1cN0,triple pos  . elbow pins Left   . FOOT SURGERY Left    multiple  . PORTACATH PLACEMENT  08/03/2011   Procedure: INSERTION PORT-A-CATH;  Surgeon: Merrie Roof, MD;  Location: [facility];  Service: General;  Laterality: Right;  . TOTAL HIP ARTHROPLASTY Bilateral    Social History   Occupational  History  . Not on file  Tobacco Use  . Smoking status: Never Smoker  . Smokeless tobacco: Never Used  Substance and Sexual Activity  . Alcohol use: Yes    Alcohol/week: 1.0 standard drinks    Types: 1 Glasses of wine per week    Comment: occ  . Drug use: No  . Sexual activity: Not on file

## 2018-09-08 ENCOUNTER — Encounter: Payer: Self-pay | Admitting: Gastroenterology

## 2018-09-13 ENCOUNTER — Ambulatory Visit: Payer: BLUE CROSS/BLUE SHIELD | Admitting: Internal Medicine

## 2018-09-15 ENCOUNTER — Ambulatory Visit: Payer: BLUE CROSS/BLUE SHIELD | Admitting: Internal Medicine

## 2018-09-15 ENCOUNTER — Encounter: Payer: Self-pay | Admitting: Internal Medicine

## 2018-09-15 ENCOUNTER — Ambulatory Visit (INDEPENDENT_AMBULATORY_CARE_PROVIDER_SITE_OTHER): Payer: BLUE CROSS/BLUE SHIELD | Admitting: Internal Medicine

## 2018-09-15 VITALS — BP 120/80 | HR 104 | Temp 98.1°F | Ht 67.0 in | Wt 246.0 lb

## 2018-09-15 DIAGNOSIS — J040 Acute laryngitis: Secondary | ICD-10-CM | POA: Diagnosis not present

## 2018-09-15 DIAGNOSIS — Z79899 Other long term (current) drug therapy: Secondary | ICD-10-CM

## 2018-09-15 MED ORDER — LEVOFLOXACIN 500 MG PO TABS: 500.0000 mg | ORAL_TABLET | Freq: Every day | ORAL | 0 refills | Status: DC

## 2018-09-15 MED ORDER — DULOXETINE HCL 30 MG PO CPEP: 30.0000 mg | ORAL_CAPSULE | Freq: Every day | ORAL | 1 refills | Status: DC

## 2018-09-15 NOTE — Progress Notes (Addendum)
   Subjective:    Patient ID: Tara [name], female    DOB: 10/26/1955, 63 y.o.   MRN: [id number]  HPI She saw Dr. Creig Hines in [facility] regarding chronic thoracic radiculopathy and had a steroid injection with complete resolution of pain.  This was in November.  In December she and her husband went on a river cruise in [place].  She came down with a respiratory infection that has persisted.  She is hoarse.  No fever or chills or flulike symptoms.  Since her thoracic pain is improved she wants to come off of Cymbalta.  Currently on 60 mg daily and we have discussed how to taper off of this over 90 days.  We are going to decrease it to 30 mg daily for a month, then every other day for a month and then twice a week for a month.  She also wants to come off of digoxin which she has taken for many years for an irregular heartbeat.  I do not want to taper to medications at 1 time so once she comes off the Cymbalta she may take the digoxin every other day for a month and then discontinue it altogether.  While on the cruise she was sleepwalking and apparently fell in her state room and suffered a contusion of her left buttock and a hematoma of her scalp.    Review of Systems see above     Objective:   Physical Exam She seems hoarse when she speaks.  TMs are clear.  Pharynx is clear.  Neck supple.  No adenopathy.  Chest clear to auscultation without rales or wheezing.       Assessment & Plan:  Protracted laryngitis and hoarseness  Medication management  Plan: Decrease Cymbalta from 60 to 30 mg daily for 1 month then take every other day for 1 month and then twice a week for an additional month.  Once off of Cymbalta she may taper digoxin every other day for 1 month then discontinue.  We have scheduled her physical exam here from May 2020.  For respiratory infection, she will take Levaquin 500 mg daily for 7 days

## 2018-09-15 NOTE — Patient Instructions (Addendum)
Decrease Cymbalta from 60 to 30 mg daily from 1 month, then take every other day for 1 month, then twice weekly for 1 month before discontinuing it altogether.  After that may take digoxin every other day for a month and then discontinue it.  Levaquin 500 mg daily for 7 days

## 2018-09-27 ENCOUNTER — Encounter (INDEPENDENT_AMBULATORY_CARE_PROVIDER_SITE_OTHER): Payer: BLUE CROSS/BLUE SHIELD

## 2018-09-30 ENCOUNTER — Ambulatory Visit (AMBULATORY_SURGERY_CENTER): Payer: Self-pay | Admitting: *Deleted

## 2018-09-30 ENCOUNTER — Encounter: Payer: Self-pay | Admitting: Gastroenterology

## 2018-09-30 VITALS — Ht 67.0 in | Wt 245.0 lb

## 2018-09-30 DIAGNOSIS — Z86010 Personal history of colonic polyps: Secondary | ICD-10-CM

## 2018-09-30 MED ORDER — NA SULFATE-K SULFATE-MG SULF 17.5-3.13-1.6 GM/177ML PO SOLN: 1.0000 | Freq: Once | ORAL | 0 refills | Status: AC

## 2018-09-30 NOTE — Progress Notes (Signed)
  No egg or soy allergy known to patient  No issues with past sedation with any surgeries  or procedures, no intubation problems  No diet pills per patient No home 02 use per patient  No blood thinners per patient  Pt denies issues with constipation  No A fib or A flutter  EMMI video sent to pt's e mail -- pt declined  PNM $50 coupon given to pt

## 2018-10-10 ENCOUNTER — Encounter (INDEPENDENT_AMBULATORY_CARE_PROVIDER_SITE_OTHER): Payer: Self-pay | Admitting: Bariatrics

## 2018-10-10 ENCOUNTER — Ambulatory Visit (INDEPENDENT_AMBULATORY_CARE_PROVIDER_SITE_OTHER): Payer: BLUE CROSS/BLUE SHIELD | Admitting: Bariatrics

## 2018-10-10 VITALS — BP 117/76 | HR 86 | Temp 97.9°F | Ht 67.0 in | Wt 243.0 lb

## 2018-10-10 DIAGNOSIS — R0602 Shortness of breath: Secondary | ICD-10-CM | POA: Diagnosis not present

## 2018-10-10 DIAGNOSIS — F3289 Other specified depressive episodes: Secondary | ICD-10-CM

## 2018-10-10 DIAGNOSIS — R7303 Prediabetes: Secondary | ICD-10-CM

## 2018-10-10 DIAGNOSIS — Z0289 Encounter for other administrative examinations: Secondary | ICD-10-CM

## 2018-10-10 DIAGNOSIS — Z9189 Other specified personal risk factors, not elsewhere classified: Secondary | ICD-10-CM

## 2018-10-10 DIAGNOSIS — E038 Other specified hypothyroidism: Secondary | ICD-10-CM | POA: Diagnosis not present

## 2018-10-10 DIAGNOSIS — E559 Vitamin D deficiency, unspecified: Secondary | ICD-10-CM

## 2018-10-10 DIAGNOSIS — R5383 Other fatigue: Secondary | ICD-10-CM

## 2018-10-10 DIAGNOSIS — Z6838 Body mass index (BMI) 38.0-38.9, adult: Secondary | ICD-10-CM

## 2018-10-10 NOTE — Progress Notes (Signed)
Office: [phone]  /  Fax: [phone]   Dear Dr. Renold [name],   Thank you for referring Tara [name] to our clinic. The following note includes my evaluation and treatment recommendations.  HPI:   Chief Complaint: OBESITY    Tara [name] has been referred by Tara [name]. Tara Genta, MD for consultation regarding her obesity and obesity related comorbidities.    Tara [name] (MR# [id number]) is a 63 y.o. female who presents on 10/10/2018 for obesity evaluation and treatment. Current BMI is Body mass index is 38.06 kg/m.Tara [name] has been struggling with her weight for many years and has been unsuccessful in either losing weight, maintaining weight loss, or reaching her healthy weight goal.     Tara [name] attended our information session and states she is currently in the action stage of change and ready to dedicate time achieving and maintaining a healthier weight. Tara [name] is interested in becoming our patient and working on intensive lifestyle modifications including (but not limited to) diet, exercise and weight loss.    Tara [name] states her family eats meals together she thinks her family will eat healthier with  her her desired weight loss is 63 lbs she has been heavy most of her life she started gaining weight in her teens her heaviest weight ever was 250 lbs. she likes to cook but she has obstacles, such as "finding good healthy meals" she craves chocolate and fried foods  she snacks frequently in the evenings she is frequently drinking liquids with calories she frequently makes poor food choices she frequently eats larger portions than normal  she states she over eats daily she has binge eating behaviors she struggles with emotional eating    Fatigue Tara [name] feels her energy is lower than it should be. This has worsened with weight gain and has not worsened recently. Tara [name] admits to daytime somnolence and she admits to waking up still tired. Patient is at risk for obstructive sleep apnea. Patent has  a history of symptoms of daytime fatigue, morning fatigue and morning headache. Patient generally gets 12 hours of sleep per night, and states they generally have restless sleep. Snoring is present. Apneic episodes are not present. Epworth Sleepiness Score is 16  Dyspnea on exertion Tara [name] notes increasing shortness of breath with certain activities (climbing stairs, walking fast) and seems to be worsening over time with weight gain. She notes getting out of breath sooner with activity than she used to. This has not gotten worse recently. Tara [name] denies orthopnea.  Hypothyroidism Tara [name] has a diagnosis of hypothyroidism. She is currently taking thyroid medications. She denies hot or cold intolerance or palpitations.  Pre-Diabetes Tara [name] has a diagnosis of prediabetes based on her elevated Hgb A1c and was informed this puts her at greater risk of developing diabetes. Her last A1c was at 6.3 She is taking metformin currently and continues to work on diet and exercise to decrease risk of diabetes. She denies hypoglycemia.  At risk for diabetes Tara [name] is at higher than average risk for developing diabetes due to her obesity and prediabetes. She currently denies polyuria or polydipsia.  Vitamin D deficiency Tara [name] has a diagnosis of vitamin D deficiency. She is currently taking multi-vitamin and denies nausea, vomiting or muscle weakness.  Depression with emotional eating behaviors Tara [name] is struggling with emotional eating and using food for comfort to the extent that it is negatively impacting her health. She often snacks when she is not hungry. Tara [name] sometimes feels she is out of control and then feels guilty  that she made poor food choices. She is attempting to work on behavior modification techniques to help reduce her emotional eating. She shows no sign of suicidal or homicidal ideations. PHQ-9 score is 17  Depression Screen Tara [name]'s Food and Mood (modified PHQ-9) score was  Depression screen PHQ 2/9 10/10/2018    Decreased Interest 3  Down, Depressed, Hopeless 1  PHQ - 2 Score 4  Altered sleeping 1  Tired, decreased energy 2  Change in appetite 3  Feeling bad or failure about yourself  3  Trouble concentrating 3  Moving slowly or fidgety/restless 0  Suicidal thoughts 1  PHQ-9 Score 17  Difficult doing work/chores Not difficult at all  Some recent data might be hidden    ASSESSMENT AND PLAN:  Other fatigue - Plan: EKG 12-Lead  Shortness of breath on exertion  Other specified hypothyroidism  Prediabetes - Plan: Comprehensive metabolic panel, Hemoglobin A1c, Insulin, random  Vitamin D deficiency - Plan: VITAMIN D 25 Hydroxy (Vit-D Deficiency, Fractures)  Other depression - with emotional eating  At risk for diabetes mellitus  Class 2 severe obesity with serious comorbidity and body mass index (BMI) of 38.0 to 38.9 in adult, unspecified obesity type (HCC)  PLAN:  Fatigue Tara [name] was informed that her fatigue may be related to obesity, depression or many other causes. Labs will be ordered, and in the meanwhile Tara [name] has agreed to work on diet, exercise and weight loss to help with fatigue. Proper sleep hygiene was discussed including the need for 7-8 hours of quality sleep each night. A sleep study was not ordered based on symptoms and Epworth score.  Dyspnea on exertion Tara [name]'s shortness of breath appears to be obesity related and exercise induced. She has agreed to work on weight loss and gradually increase exercise to treat her exercise induced shortness of breath. If Tara [name] follows our instructions and loses weight without improvement of her shortness of breath, we will plan to refer to pulmonology. We will monitor this condition regularly. Tara [name] agrees to this plan.  Hypothyroidism Tara [name] was informed of the importance of good thyroid control to help with weight loss efforts. She was also informed that supertheraputic thyroid levels are dangerous and will not improve weight loss results.  Tara [name] will continue her medications as prescribed and follow up at the agreed upon time.  Pre-Diabetes Jai will continue to work on weight loss, exercise, and decreasing simple carbohydrates in her diet to help decrease the risk of diabetes. She was informed that eating too many simple carbohydrates or too many calories at one sitting increases the likelihood of GI side effects. We will check Hgb A1c and insulin level today. Tara [name] will continue Metformin and. follow up with [name] as directed to monitor her progress.  Diabetes risk counseling Tara [name] was given extended (15 minutes) diabetes prevention counseling today. She is 63 y.o. female and has risk factors for diabetes including obesity and prediabetes. We discussed intensive lifestyle modifications today with an emphasis on weight loss as well as increasing exercise and decreasing simple carbohydrates in her diet.  Vitamin D Deficiency Tara [name] was informed that low vitamin D levels contributes to fatigue and are associated with obesity, breast, and colon cancer. She will continue to take multi-vitamin and will follow up for routine testing of vitamin D, at least 2-3 times per year. We will check vitamin D level today.  Depression with Emotional Eating Behaviors We discussed behavior modification techniques today to help Tara [name] deal with her emotional eating and depression. We will  refer Tara [name] to Dr. Mallie Mussel our bariatric psychologist.  Depression Screen Tara [name] had a strongly positive depression screening. Depression is commonly associated with obesity and often results in emotional eating behaviors. We will monitor this closely and work on CBT to help improve the non-hunger eating patterns.   Obesity Tara [name] is currently in the action stage of change and her goal is to continue with weight loss efforts. I recommend Caresse begin the structured treatment plan as follows:  She has agreed to follow the Category 1 plan +100 calories Tara [name] has been instructed to  eventually work up to a goal of 150 minutes of combined cardio and strengthening exercise per week for weight loss and overall health benefits. We discussed the following Behavioral Modification Strategies today: increase H2O intake, no skipping meals, increasing lean protein intake, decreasing simple carbohydrates, increasing vegetables and work on meal planning and easy cooking plans   She was informed of the importance of frequent follow up visits to maximize her success with intensive lifestyle modifications for her multiple health conditions. She was informed we would discuss her lab results at her next visit unless there is a critical issue that needs to be addressed sooner. Tara [name] agreed to keep her next visit at the agreed upon time to discuss these results.  ALLERGIES: Allergies  Allergen Reactions  . Aspirin Other (See Comments)    Pt has a hx of TTP.   . Nsaids Other (See Comments)    Pt has a hx of TTP.     MEDICATIONS: Current Outpatient Medications on File Prior to Visit  Medication Sig Dispense Refill  . acetaminophen (TYLENOL) 500 MG tablet Take 500 mg by mouth every 6 (six) hours as needed for mild pain, moderate pain or headache.     . ADVAIR DISKUS 250-50 MCG/DOSE AEPB INHALE ONE PUFF BY MOUTH TWICE A DAY 60 each 1  . albuterol (PROVENTIL HFA;VENTOLIN HFA) 108 (90 BASE) MCG/ACT inhaler Inhale 2 puffs into the lungs every 6 (six) hours as needed for wheezing or shortness of breath.     Marland Kitchen DIGOX 250 MCG tablet TAKE ONE TABLET BY MOUTH DAILY 60 tablet 3  . DULoxetine (CYMBALTA) 30 MG capsule Take 1 capsule (30 mg total) by mouth daily. 90 capsule 1  . fluticasone (FLONASE) 50 MCG/ACT nasal spray Place 2 sprays into both nostrils daily as needed for rhinitis.    Marland Kitchen gabapentin (NEURONTIN) 300 MG capsule Take 1 capsule (300 mg total) by mouth 2 (two) times daily. 60 capsule 5  . levothyroxine (SYNTHROID, LEVOTHROID) 88 MCG tablet TAKE ONE TABLET BY MOUTH DAILY 90 tablet 1  .  magnesium oxide (MAG-OX) 400 MG tablet Take 400 mg by mouth daily.    . metFORMIN (GLUCOPHAGE) 500 MG tablet TAKE ONE TABLET BY MOUTH TWO TIMES A DAY WITH A MEAL 180 tablet 3  . montelukast (SINGULAIR) 10 MG tablet TAKE ONE TABLET (10 MG) BY MOUTH AT BEDTIME. 90 tablet 2  . Multiple Vitamin (MULTIVITAMIN WITH MINERALS) TABS tablet Take 1 tablet by mouth daily.    . potassium chloride SA (K-DUR,KLOR-CON) 20 MEQ tablet Take 1 tablet (20 mEq total) by mouth daily. 90 tablet 3  . torsemide (DEMADEX) 20 MG tablet TAKE ONE TABLET BY MOUTH EVERY DAY. 90 tablet 1   No current facility-administered medications on file prior to visit.     PAST MEDICAL HISTORY: Past Medical History:  Diagnosis Date  . Allergy   . Anxiety   . Arthritis   . Asthma   .  Back pain   . Blood transfusion   . Breast cancer ([place]) 2012   left breast  . Chronic edema   . Cold hands and feet   . Complication of anesthesia    difficulty waking up/dizzy/lightheaded  . Cough   . Diabetes mellitus without complication ([place])    on Metformin-   . Dysrhythmia    irregular heartbeat, takes digoxin  . Environmental allergies   . Eye pain   . Fatigue   . GERD (gastroesophageal reflux disease)    occasionally   . H/O varicella   . H/O varicose veins   . Hepatic cirrhosis ([place])   . Hepatitis C   . History of measles, mumps, or rubella   . Hoarseness   . Hx of radiation therapy 12/15/11 - 01/29/12   left breast  . Hypothyroidism   . Irregular heart beat    under control  . Joint pain   . Leg cramps   . Neuromuscular disorder ([place])    left foot nerve damage- neuropathy   . Osteopenia   . Palpitations   . Pre-diabetes   . Rheumatoid arthritis ([place])   . Ringing in ear   . Shortness of breath   . Swallowing difficulty   . Swelling of both lower extremities   . Thrombocytopenia, primary ([place])   . TTP (thrombotic thrombocytopenic purpura) ([place])     PAST SURGICAL HISTORY: Past Surgical History:  Procedure  Laterality Date  . BREAST LUMPECTOMY  08/03/11   left lumpectomy and slnbx,T1cN0,triple pos  . COLONOSCOPY    . elbow pins Left   . FOOT SURGERY Left    multiple  . POLYPECTOMY    . PORTACATH PLACEMENT  08/03/2011   Procedure: INSERTION PORT-A-CATH;  Surgeon: Merrie Roof, MD;  Location: [facility];  Service: General;  Laterality: Right;  . portacath removal    . TOTAL HIP ARTHROPLASTY Bilateral   . UPPER GASTROINTESTINAL ENDOSCOPY      SOCIAL HISTORY: Social History   Tobacco Use  . Smoking status: Never Smoker  . Smokeless tobacco: Never Used  Substance Use Topics  . Alcohol use: Yes    Alcohol/week: 1.0 standard drinks    Types: 1 Glasses of wine per week    Comment: occ  . Drug use: No    FAMILY HISTORY: Family History  Problem Relation Age of Onset  . Pneumonia Mother   . COPD Mother   . Colon polyps Mother   . Heart disease Mother   . Thyroid disease Mother   . Obesity Mother   . COPD Father   . Obesity Father   . Brain cancer Maternal Grandfather   . Alcohol abuse Maternal Grandfather   . Hyperlipidemia Neg Hx   . Hypertension Neg Hx   . Colon cancer Neg Hx   . Rectal cancer Neg Hx   . Stomach cancer Neg Hx     ROS: Review of Systems  Constitutional: Positive for malaise/fatigue.  HENT: Positive for congestion (nasal stuffiness) and tinnitus.        +Hoarseness + Difficult or Painful Swallowing  Eyes:       + Wear Glasses or Contacts + Floaters  Respiratory: Positive for cough.   Cardiovascular: Negative for palpitations and orthopnea.       + Calf/Leg Pain with Walking + Leg Cramping + Very Cold Feet or Hands  Gastrointestinal: Positive for diarrhea. Negative for nausea and vomiting.  Genitourinary: Negative for frequency.  Musculoskeletal:  Negative for muscle weakness  Neurological: Positive for weakness.  Endo/Heme/Allergies: Negative for polydipsia.       Negative for heat or cold intolerance Negative for hypoglycemia    Psychiatric/Behavioral: Positive for depression. Negative for suicidal ideas.    PHYSICAL EXAM: Blood pressure 117/76, pulse 86, temperature 97.9 F (36.6 C), temperature source Oral, height 5\' 7"  (1.702 m), weight 243 lb (110.2 kg), SpO2 94 %. Body mass index is 38.06 kg/m. Physical Exam Vitals signs reviewed.  Constitutional:      Appearance: Normal appearance. She is well-developed. She is obese.  HENT:     Head: Normocephalic and atraumatic.     Nose: Nose normal.     Mouth/Throat:     Comments: Mallampati = 2 Eyes:     General: No scleral icterus.    Extraocular Movements: Extraocular movements intact.  Neck:     Musculoskeletal: Normal range of motion and neck supple.     Thyroid: No thyromegaly.  Cardiovascular:     Rate and Rhythm: Normal rate and regular rhythm.  Pulmonary:     Effort: Pulmonary effort is normal. No respiratory distress.  Abdominal:     Palpations: Abdomen is soft.     Tenderness: There is no abdominal tenderness.  Musculoskeletal: Normal range of motion.     Comments: Range of Motion normal in all 4 extremities  Skin:    General: Skin is warm and dry.  Neurological:     Mental Status: She is alert and oriented to person, place, and time.     Coordination: Coordination normal.  Psychiatric:        Mood and Affect: Mood normal.        Behavior: Behavior normal.        Thought Content: Thought content does not include homicidal or suicidal ideation.     RECENT LABS AND TESTS: BMET    Component Value Date/Time   NA 141 07/08/2018 0925   NA 136 04/20/2017 1422   K 5.2 07/08/2018 0925   K 4.0 04/20/2017 1422   CL 103 07/08/2018 0925   CL 108 (H) 12/05/2012 0953   CO2 29 07/08/2018 0925   CO2 28 04/20/2017 1422   GLUCOSE 111 (H) 07/08/2018 0925   GLUCOSE 136 04/20/2017 1422   GLUCOSE 112 (H) 12/05/2012 0953   BUN 14 07/08/2018 0925   BUN 19.8 04/20/2017 1422   CREATININE 0.84 07/08/2018 0925   CREATININE 1.0 04/20/2017 1422    CALCIUM 8.6 07/08/2018 0925   CALCIUM 9.0 04/20/2017 1422   GFRNONAA 74 07/08/2018 0925   GFRAA 86 07/08/2018 0925   Lab Results  Component Value Date   HGBA1C 6.3 (H) 07/08/2018   No results found for: INSULIN CBC    Component Value Date/Time   WBC 7.0 07/08/2018 0925   RBC 3.84 07/08/2018 0925   HGB 11.9 07/08/2018 0925   HGB 12.7 04/27/2018 1234   HGB 12.9 04/20/2017 1422   HCT 35.4 07/08/2018 0925   HCT 38.7 04/20/2017 1422   PLT 156 07/08/2018 0925   PLT 169 04/27/2018 1234   PLT 168 04/20/2017 1422   MCV 92.2 07/08/2018 0925   MCV 96.0 04/20/2017 1422   MCH 31.0 07/08/2018 0925   MCHC 33.6 07/08/2018 0925   RDW 13.6 07/08/2018 0925   RDW 14.0 04/20/2017 1422   LYMPHSABS 1,008 07/08/2018 0925   LYMPHSABS 1.0 04/20/2017 1422   MONOABS 0.7 06/14/2018 1144   MONOABS 0.6 04/20/2017 1422   EOSABS 350 07/08/2018 0925  EOSABS 0.3 04/20/2017 1422   BASOSABS 63 07/08/2018 0925   BASOSABS 0.0 04/20/2017 1422   Iron/TIBC/Ferritin/ %Sat No results found for: IRON, TIBC, FERRITIN, IRONPCTSAT Lipid Panel     Component Value Date/Time   CHOL 231 (H) 07/08/2018 0925   TRIG 191 (H) 07/08/2018 0925   HDL 49 (L) 07/08/2018 0925   CHOLHDL 4.7 07/08/2018 0925   VLDL 29 06/29/2016 1103   LDLCALC 149 (H) 07/08/2018 0925   Hepatic Function Panel     Component Value Date/Time   PROT 6.8 07/08/2018 0925   PROT 7.4 04/20/2017 1422   ALBUMIN 4.4 06/14/2018 1144   ALBUMIN 3.7 04/20/2017 1422   AST 16 07/08/2018 0925   AST 32 04/20/2017 1422   ALT 16 07/08/2018 0925   ALT 32 (H) 04/21/2017 1120   ALT 35 04/20/2017 1422   ALKPHOS 52 06/14/2018 1144   ALKPHOS 81 04/20/2017 1422   BILITOT 0.6 07/08/2018 0925   BILITOT 0.78 04/20/2017 1422   BILIDIR 0.3 01/12/2012 1137      Component Value Date/Time   TSH 2.21 07/08/2018 0925   TSH 1.53 10/21/2017 1250   TSH 1.39 06/29/2016 1103    ECG  shows NSR with a rate of 88 BPM INDIRECT CALORIMETER done today shows a VO2 of 192  and a REE of 1334.  Her calculated basal metabolic rate is 9381 thus her basal metabolic rate is worse than expected.       OBESITY BEHAVIORAL INTERVENTION VISIT  Today's visit was # 1   Starting weight: 243 lbs Starting date: 10/10/2018 Today's weight : 243 lbs  Today's date: 10/10/2018 Total lbs lost to date: 0   ASK: We discussed the diagnosis of obesity with [name] today and Tara [name] agreed to give [name] permission to discuss obesity behavioral modification therapy today.  ASSESS: Tara [name] has the diagnosis of obesity and her BMI today is 38.05 Jazel is in the action stage of change   ADVISE: Liliani was educated on the multiple health risks of obesity as well as the benefit of weight loss to improve her health. She was advised of the need for long term treatment and the importance of lifestyle modifications to improve her current health and to decrease her risk of future health problems.  AGREE: Multiple dietary modification options and treatment options were discussed and  Janee agreed to follow the recommendations documented in the above note.  ARRANGE: Maeryn was educated on the importance of frequent visits to treat obesity as outlined per CMS and USPSTF guidelines and agreed to schedule her next follow up appointment today.  Corey Skains, am acting as Location manager for [employer]. Owens Shark, DO  I have reviewed the above documentation for accuracy and completeness, and I agree with the above. -Jearld Lesch, DO

## 2018-10-11 LAB — COMPREHENSIVE METABOLIC PANEL
ALT: 12 IU/L (ref 0–32)
AST: 20 IU/L (ref 0–40)
Albumin/Globulin Ratio: 1.4 (ref 1.2–2.2)
Albumin: 4.4 g/dL (ref 3.8–4.8)
Alkaline Phosphatase: 61 IU/L (ref 39–117)
BUN/Creatinine Ratio: 14 (ref 12–28)
BUN: 13 mg/dL (ref 8–27)
Bilirubin Total: 0.5 mg/dL (ref 0.0–1.2)
CO2: 22 mmol/L (ref 20–29)
Calcium: 9.2 mg/dL (ref 8.7–10.3)
Chloride: 94 mmol/L — ABNORMAL LOW (ref 96–106)
Creatinine, Ser: 0.91 mg/dL (ref 0.57–1.00)
GFR calc Af Amer: 77 mL/min/{1.73_m2} (ref >59)
GFR calc non Af Amer: 67 mL/min/{1.73_m2} (ref >59)
Globulin, Total: 3.1 g/dL (ref 1.5–4.5)
Glucose: 104 mg/dL — ABNORMAL HIGH (ref 65–99)
Potassium: 4.7 mmol/L (ref 3.5–5.2)
Sodium: 137 mmol/L (ref 134–144)
Total Protein: 7.5 g/dL (ref 6.0–8.5)

## 2018-10-11 LAB — HEMOGLOBIN A1C
Est. average glucose Bld gHb Est-mCnc: 131 mg/dL
Hgb A1c MFr Bld: 6.2 % — ABNORMAL HIGH (ref 4.8–5.6)

## 2018-10-11 LAB — INSULIN, RANDOM: INSULIN: 31.9 u[IU]/mL — ABNORMAL HIGH (ref 2.6–24.9)

## 2018-10-11 LAB — VITAMIN D 25 HYDROXY (VIT D DEFICIENCY, FRACTURES): Vit D, 25-Hydroxy: 17 ng/mL — ABNORMAL LOW (ref 30.0–100.0)

## 2018-10-14 ENCOUNTER — Encounter: Payer: Self-pay | Admitting: Gastroenterology

## 2018-10-14 ENCOUNTER — Ambulatory Visit (AMBULATORY_SURGERY_CENTER): Payer: BLUE CROSS/BLUE SHIELD | Admitting: Gastroenterology

## 2018-10-14 VITALS — BP 126/80 | HR 77 | Temp 97.1°F | Resp 22 | Ht 67.0 in | Wt 243.0 lb

## 2018-10-14 DIAGNOSIS — Z86010 Personal history of colonic polyps: Secondary | ICD-10-CM | POA: Diagnosis not present

## 2018-10-14 DIAGNOSIS — D123 Benign neoplasm of transverse colon: Secondary | ICD-10-CM

## 2018-10-14 MED ORDER — SODIUM CHLORIDE 0.9 % IV SOLN: 500.0000 mL | Freq: Once | INTRAVENOUS | Status: DC

## 2018-10-14 NOTE — Progress Notes (Signed)
To PACU< VSS. Report to [email or web address] 

## 2018-10-14 NOTE — Progress Notes (Signed)
Pt will call to schedule 3 months colonoscopy and recall will be put in due to schedule not out that far yet

## 2018-10-14 NOTE — Patient Instructions (Signed)
   Information on polyps & diverticulosis given to you today   Await pathology results    YOU HAD AN ENDOSCOPIC PROCEDURE TODAY AT [facility]:   Refer to the procedure report that was given to you for any specific questions about what was found during the examination.  If the procedure report does not answer your questions, please call your gastroenterologist to clarify.  If you requested that your care partner not be given the details of your procedure findings, then the procedure report has been included in a sealed envelope for you to review at your convenience later.  YOU SHOULD EXPECT: Some feelings of bloating in the abdomen. Passage of more gas than usual.  Walking can help get rid of the air that was put into your GI tract during the procedure and reduce the bloating. If you had a lower endoscopy (such as a colonoscopy or flexible sigmoidoscopy) you may notice spotting of blood in your stool or on the toilet paper. If you underwent a bowel prep for your procedure, you may not have a normal bowel movement for a few days.  Please Note:  You might notice some irritation and congestion in your nose or some drainage.  This is from the oxygen used during your procedure.  There is no need for concern and it should clear up in a day or so.  SYMPTOMS TO REPORT IMMEDIATELY:   Following lower endoscopy (colonoscopy or flexible sigmoidoscopy):  Excessive amounts of blood in the stool  Significant tenderness or worsening of abdominal pains  Swelling of the abdomen that is new, acute  Fever of 100F or higher    For urgent or emergent issues, a gastroenterologist can be reached at any hour by calling [phone].   DIET:  We do recommend a small meal at first, but then you may proceed to your regular diet.  Drink plenty of fluids but you should avoid alcoholic beverages for 24 hours.  ACTIVITY:  You should plan to take it easy for the rest of today and you should NOT DRIVE  or use heavy machinery until tomorrow (because of the sedation medicines used during the test).    FOLLOW UP: Our staff will call the number listed on your records the next business day following your procedure to check on you and address any questions or concerns that you may have regarding the information given to you following your procedure. If we do not reach you, we will leave a message.  However, if you are feeling well and you are not experiencing any problems, there is no need to return our call.  We will assume that you have returned to your regular daily activities without incident.  If any biopsies were taken you will be contacted by phone or by letter within the next 1-3 weeks.  Please call us at [phone] if you have not heard about the biopsies in 3 weeks.    SIGNATURES/CONFIDENTIALITY: You and/or your care partner have signed paperwork which will be entered into your electronic medical record.  These signatures attest to the fact that that the information above on your After Visit Summary has been reviewed and is understood.  Full responsibility of the confidentiality of this discharge information lies with you and/or your care-partner.

## 2018-10-14 NOTE — Op Note (Signed)
[place] Patient Name: Tara [name] Procedure Date: 10/14/2018 8:35 AM MRN: [id number] Endoscopist: Thornton Park MD, MD Age: 63 Referring MD:  Date of Birth: 1956-03-26 Gender: Female Account #: [id number] Procedure:                Colonoscopy Indications:              High risk colon cancer surveillance: Personal                            history of adenoma (10 mm or greater in size).                            History of adenomas 2007. Normal flex sig 2014.                            Colonoscopy 05/2018 showed a >25 cm splenic flexure                            tubulovillous adenoma with attempted resection in a                            piecemeal fashion. Surveillance exam in 3 months                            recommended. Medicines:                See the Anesthesia note for documentation of the                            administered medications Procedure:                Pre-Anesthesia Assessment:                           - Prior to the procedure, a History and Physical                            was performed, and patient medications and                            allergies were reviewed. The patient's tolerance of                            previous anesthesia was also reviewed. The risks                            and benefits of the procedure and the sedation                            options and risks were discussed with the patient.                            All questions were answered, and informed consent  was obtained. Prior Anticoagulants: The patient has                            taken no previous anticoagulant or antiplatelet                            agents. ASA Grade Assessment: III - A patient with                            severe systemic disease. After reviewing the risks                            and benefits, the patient was deemed in                            satisfactory condition to undergo the  procedure.                           After obtaining informed consent, the colonoscope                            was passed under direct vision. Throughout the                            procedure, the patient's blood pressure, pulse, and                            oxygen saturations were monitored continuously. The                            Colonoscope was introduced through the anus and                            advanced to the the terminal ileum, with                            identification of the appendiceal orifice and IC                            valve. The colonoscopy was performed without                            difficulty. The patient tolerated the procedure                            well. The quality of the bowel preparation was                            good. The terminal ileum, ileocecal valve,                            appendiceal orifice, and rectum were photographed. Scope In: 8:47:46 AM Scope Out: 9:10:27 AM Scope Withdrawal Time: 0 hours 17 minutes 11 seconds  Total Procedure  Duration: 0 hours 22 minutes 41 seconds  Findings:                 The perianal and digital rectal examinations were                            normal.                           A few small-mouthed diverticula were found in the                            sigmoid colon and descending colon.                           A 15 mm polyp was found in the hepatic flexure.                            Tattoo was visible. Although previously identified                            as a splenic flexure polyp, the residual polyp and                            tattoo was at the hepatic flexure. The polyp was                            multi-lobulated. The polyp was removed with a                            piecemeal technique using a hot snare. Resection                            and retrieval were complete. Estimated blood loss                            was minimal. Estimated blood loss was minimal.                            The exam was otherwise without abnormality on                            direct and retroflexion views. Complications:            No immediate complications. Estimated blood loss:                            Minimal. Estimated Blood Loss:     Estimated blood loss was minimal. Impression:               - Diverticulosis in the sigmoid colon and in the                            descending colon.                           -  One 15 mm polyp at the hepatic flexure, removed                            piecemeal using a hot snare. Resected and retrieved.                           - The examination was otherwise normal on direct                            and retroflexion views. Recommendation:           - Patient has a contact number available for                            emergencies. The signs and symptoms of potential                            delayed complications were discussed with the                            patient. Return to normal activities tomorrow.                            Written discharge instructions were provided to the                            patient.                           - Resume regular diet.                           - Continue present medications.                           - Await pathology results.                           - Repeat colonoscopy in 3 months for retreatment. Thornton Park MD, MD 10/14/2018 9:19:53 AM This report has been signed electronically.

## 2018-10-17 ENCOUNTER — Telehealth: Payer: Self-pay

## 2018-10-17 ENCOUNTER — Telehealth: Payer: Self-pay | Admitting: *Deleted

## 2018-10-17 NOTE — Telephone Encounter (Signed)
Left message on f/u call 

## 2018-10-17 NOTE — Telephone Encounter (Signed)
  Follow up Call-  Call back number 10/14/2018 06/20/2018  Post procedure Call Back phone  # [phone]  Permission to leave phone message Yes Yes  Some recent data might be hidden      Left message for pt to call us back if any issues such as fever or pain.

## 2018-10-18 ENCOUNTER — Encounter: Payer: Self-pay | Admitting: Internal Medicine

## 2018-10-18 ENCOUNTER — Encounter: Payer: Self-pay | Admitting: Physician Assistant

## 2018-10-24 ENCOUNTER — Encounter (INDEPENDENT_AMBULATORY_CARE_PROVIDER_SITE_OTHER): Payer: Self-pay | Admitting: Bariatrics

## 2018-10-24 ENCOUNTER — Ambulatory Visit (INDEPENDENT_AMBULATORY_CARE_PROVIDER_SITE_OTHER): Payer: BLUE CROSS/BLUE SHIELD | Admitting: Bariatrics

## 2018-10-24 VITALS — BP 141/81 | HR 108 | Temp 97.6°F | Ht 67.0 in | Wt 236.0 lb

## 2018-10-24 DIAGNOSIS — E559 Vitamin D deficiency, unspecified: Secondary | ICD-10-CM

## 2018-10-24 DIAGNOSIS — R7303 Prediabetes: Secondary | ICD-10-CM

## 2018-10-24 DIAGNOSIS — Z6837 Body mass index (BMI) 37.0-37.9, adult: Secondary | ICD-10-CM

## 2018-10-24 DIAGNOSIS — Z9189 Other specified personal risk factors, not elsewhere classified: Secondary | ICD-10-CM

## 2018-10-24 DIAGNOSIS — F3289 Other specified depressive episodes: Secondary | ICD-10-CM

## 2018-10-24 MED ORDER — VITAMIN D (ERGOCALCIFEROL) 1.25 MG (50000 UNIT) PO CAPS: 50000.0000 [IU] | ORAL_CAPSULE | ORAL | 0 refills | Status: DC

## 2018-10-24 NOTE — Progress Notes (Signed)
Office: [phone]  /  Fax: [phone]   HPI:   Chief Complaint: OBESITY Tara [name] is here to discuss her progress with her obesity treatment plan. She is on the Category 1 plan +100 calories and is following her eating plan approximately 65 % of the time. She states she is exercising 0 minutes 0 times per week. Tara [name] is doing well. She was okay in the morning and in lunch, but she struggled in the dinner (choices, making "a regular dinner"). Her weight is 236 lb (107 kg) today and has had a weight loss of 7 pounds over a period of 2 weeks since her last visit. She has lost 7 lbs since starting treatment with [name].  Vitamin D deficiency Tara [name] has a diagnosis of vitamin D deficiency. Her last vitamin D level was at 17.0 She is not currently taking vit D and denies nausea, vomiting or muscle weakness.  At risk for osteopenia and osteoporosis Tara [name] is at higher risk of osteopenia and osteoporosis due to vitamin D deficiency.   Pre-Diabetes Tara [name] has a diagnosis of prediabetes based on her elevated Hgb A1c and was informed this puts her at greater risk of developing diabetes. Her last A1c was at 6.2 and last insulin level was at 31.9 She is taking metformin currently and continues to work on diet and exercise to decrease risk of diabetes. She denies polyphagia.  Depression with emotional eating behaviors Tara [name] is struggling with emotional eating and using food for comfort to the extent that it is negatively impacting her health. She often snacks when she is not hungry. Tara [name] sometimes feels she is out of control and then feels guilty that she made poor food choices. She has been working on behavior modification techniques to help reduce her emotional eating and has been somewhat successful. She shows no sign of suicidal or homicidal ideations.  Depression screen Tara [name] 2/9 10/10/2018 07/24/2018 04/20/2018 10/25/2017 04/21/2017  Decreased Interest 3 - 0 0 0  Down, Depressed, Hopeless 1 1 0 0 0  PHQ - 2 Score  4 1 0 0 0  Altered sleeping 1 0 - - -  Tired, decreased energy 2 1 - - -  Change in appetite 3 1 - - -  Feeling bad or failure about yourself  3 1 - - -  Trouble concentrating 3 1 - - -  Moving slowly or fidgety/restless 0 1 - - -  Suicidal thoughts 1 0 - - -  PHQ-9 Score 17 6 - - -  Difficult doing work/chores Not difficult at all Somewhat difficult - - -  Some recent data might be hidden     ASSESSMENT AND PLAN:  Vitamin D deficiency - Plan: Vitamin D, Ergocalciferol, (DRISDOL) 1.25 MG (50000 UT) CAPS capsule  Prediabetes  Other depression - with emotional eating  At risk for osteoporosis  Class 2 severe obesity with serious comorbidity and body mass index (BMI) of 37.0 to 37.9 in adult, unspecified obesity type (HCC)  PLAN:  Vitamin D Deficiency Tara [name] was informed that low vitamin D levels contributes to fatigue and are associated with obesity, breast, and colon cancer. She agrees to start prescription Vit D3 @50 ,000 IU every week #4 with no refills and will follow up for routine testing of vitamin D, at least 2-3 times per year. She was informed of the risk of over-replacement of vitamin D and agrees to not increase her dose unless she discusses this with [name] first. Tara [name] agrees to follow up as directed.  At  risk for osteopenia and osteoporosis Tara [name] was given extended  (15 minutes) osteoporosis prevention counseling today. Tara [name] is at risk for osteopenia and osteoporosis due to her vitamin D deficiency. She was encouraged to take her vitamin D and follow her higher calcium diet and increase strengthening exercise to help strengthen her bones and decrease her risk of osteopenia and osteoporosis.  Pre-Diabetes Tara [name] will continue to work on weight loss, exercise, and decreasing simple carbohydrates in her diet to help decrease the risk of diabetes. We dicussed metformin including benefits and risks. She was informed that eating too many simple carbohydrates or too many calories at  one sitting increases the likelihood of GI side effects. Tara [name] will continue metformin for now and a prescription was not written today. Tara [name] agreed to follow up with [name] as directed to monitor her progress.  Depression with Emotional Eating Behaviors We discussed behavior modification techniques today to help Tara [name] deal with her emotional eating and depression. She is to see Dr. Mallie Mussel tomorrow.  Obesity Tara [name] is currently in the action stage of change. As such, her goal is to continue with weight loss efforts She has agreed to follow the Category 1 plan Tara [name] has been instructed to work up to a goal of 150 minutes of combined cardio and strengthening exercise per week for weight loss and overall health benefits. We discussed the following Behavioral Modification Strategies today: increase H2O intake, no skipping meals, increasing lean protein intake, decreasing simple carbohydrates, increasing vegetables, decrease eating out and work on meal planning and easy cooking plans Tara [name] will continue to make better choices. She will minimize "fancy" coffee.  Tara [name] has agreed to follow up with our clinic in 2 weeks. She was informed of the importance of frequent follow up visits to maximize her success with intensive lifestyle modifications for her multiple health conditions.  ALLERGIES: Allergies  Allergen Reactions  . Aspirin Other (See Comments)    Pt has a hx of TTP.   . Nsaids Other (See Comments)    Pt has a hx of TTP.     MEDICATIONS: Current Outpatient Medications on File Prior to Visit  Medication Sig Dispense Refill  . acetaminophen (TYLENOL) 500 MG tablet Take 500 mg by mouth every 6 (six) hours as needed for mild pain, moderate pain or headache.     . ADVAIR DISKUS 250-50 MCG/DOSE AEPB INHALE ONE PUFF BY MOUTH TWICE A DAY 60 each 1  . albuterol (PROVENTIL HFA;VENTOLIN HFA) 108 (90 BASE) MCG/ACT inhaler Inhale 2 puffs into the lungs every 6 (six) hours as needed for wheezing or shortness of  breath.     Marland Kitchen DIGOX 250 MCG tablet TAKE ONE TABLET BY MOUTH DAILY 60 tablet 3  . DULoxetine (CYMBALTA) 30 MG capsule Take 1 capsule (30 mg total) by mouth daily. 90 capsule 1  . fluticasone (FLONASE) 50 MCG/ACT nasal spray Place 2 sprays into both nostrils daily as needed for rhinitis.    Marland Kitchen gabapentin (NEURONTIN) 300 MG capsule Take 1 capsule (300 mg total) by mouth 2 (two) times daily. 60 capsule 5  . levothyroxine (SYNTHROID, LEVOTHROID) 88 MCG tablet TAKE ONE TABLET BY MOUTH DAILY 90 tablet 1  . magnesium oxide (MAG-OX) 400 MG tablet Take 400 mg by mouth daily.    . metFORMIN (GLUCOPHAGE) 500 MG tablet TAKE ONE TABLET BY MOUTH TWO TIMES A DAY WITH A MEAL 180 tablet 3  . montelukast (SINGULAIR) 10 MG tablet TAKE ONE TABLET (10 MG) BY MOUTH AT BEDTIME. 90 tablet 2  .  Multiple Vitamin (MULTIVITAMIN WITH MINERALS) TABS tablet Take 1 tablet by mouth daily.    . potassium chloride SA (K-DUR,KLOR-CON) 20 MEQ tablet Take 1 tablet (20 mEq total) by mouth daily. 90 tablet 3  . torsemide (DEMADEX) 20 MG tablet TAKE ONE TABLET BY MOUTH EVERY DAY. 90 tablet 1   No current facility-administered medications on file prior to visit.     PAST MEDICAL HISTORY: Past Medical History:  Diagnosis Date  . Allergy   . Anxiety   . Arthritis   . Asthma   . Back pain   . Blood transfusion   . Breast cancer ([place]) 2012   left breast  . Chronic edema   . Cold hands and feet   . Complication of anesthesia    difficulty waking up/dizzy/lightheaded  . Cough   . Diabetes mellitus without complication ([place])    on Metformin-   . Dysrhythmia    irregular heartbeat, takes digoxin  . Environmental allergies   . Eye pain   . Fatigue   . GERD (gastroesophageal reflux disease)    occasionally   . H/O varicella   . H/O varicose veins   . Hepatic cirrhosis ([place])   . Hepatitis C   . History of measles, mumps, or rubella   . Hoarseness   . Hx of radiation therapy 12/15/11 - 01/29/12   left breast  .  Hypothyroidism   . Irregular heart beat    under control  . Joint pain   . Leg cramps   . Neuromuscular disorder ([place])    left foot nerve damage- neuropathy   . Osteopenia   . Palpitations   . Pre-diabetes   . Rheumatoid arthritis ([place])   . Ringing in ear   . Shortness of breath   . Swallowing difficulty   . Swelling of both lower extremities   . Thrombocytopenia, primary ([place])   . TTP (thrombotic thrombocytopenic purpura) ([place])     PAST SURGICAL HISTORY: Past Surgical History:  Procedure Laterality Date  . BREAST LUMPECTOMY  08/03/11   left lumpectomy and slnbx,T1cN0,triple pos  . COLONOSCOPY    . elbow pins Left   . FOOT SURGERY Left    multiple  . POLYPECTOMY    . PORTACATH PLACEMENT  08/03/2011   Procedure: INSERTION PORT-A-CATH;  Surgeon: Merrie Roof, MD;  Location: [facility];  Service: General;  Laterality: Right;  . portacath removal    . TOTAL HIP ARTHROPLASTY Bilateral   . UPPER GASTROINTESTINAL ENDOSCOPY      SOCIAL HISTORY: Social History   Tobacco Use  . Smoking status: Never Smoker  . Smokeless tobacco: Never Used  Substance Use Topics  . Alcohol use: Yes    Alcohol/week: 1.0 standard drinks    Types: 1 Glasses of wine per week    Comment: occ  . Drug use: No    FAMILY HISTORY: Family History  Problem Relation Age of Onset  . Pneumonia Mother   . COPD Mother   . Colon polyps Mother   . Heart disease Mother   . Thyroid disease Mother   . Obesity Mother   . COPD Father   . Obesity Father   . Brain cancer Maternal Grandfather   . Alcohol abuse Maternal Grandfather   . Hyperlipidemia Neg Hx   . Hypertension Neg Hx   . Colon cancer Neg Hx   . Rectal cancer Neg Hx   . Stomach cancer Neg Hx   . Esophageal cancer Neg Hx  ROS: Review of Systems  Constitutional: Positive for weight loss.  Gastrointestinal: Negative for nausea and vomiting.  Musculoskeletal:       Negative for muscle weakness  Endo/Heme/Allergies:       Negative for  polyphagia  Psychiatric/Behavioral: Positive for depression. Negative for suicidal ideas.    PHYSICAL EXAM: Blood pressure (!) 141/81, pulse (!) 108, temperature 97.6 F (36.4 C), temperature source Oral, height 5\' 7"  (1.702 m), weight 236 lb (107 kg), SpO2 96 %. Body mass index is 36.96 kg/m. Physical Exam Vitals signs reviewed.  Constitutional:      Appearance: Normal appearance. She is well-developed. She is obese.  Cardiovascular:     Rate and Rhythm: Normal rate.  Pulmonary:     Effort: Pulmonary effort is normal.  Musculoskeletal: Normal range of motion.  Skin:    General: Skin is warm and dry.  Neurological:     Mental Status: She is alert and oriented to person, place, and time.  Psychiatric:        Mood and Affect: Mood normal.        Behavior: Behavior normal.        Thought Content: Thought content does not include homicidal or suicidal ideation.     RECENT LABS AND TESTS: BMET    Component Value Date/Time   NA 137 10/10/2018 1347   NA 136 04/20/2017 1422   K 4.7 10/10/2018 1347   K 4.0 04/20/2017 1422   CL 94 (L) 10/10/2018 1347   CL 108 (H) 12/05/2012 0953   CO2 22 10/10/2018 1347   CO2 28 04/20/2017 1422   GLUCOSE 104 (H) 10/10/2018 1347   GLUCOSE 111 (H) 07/08/2018 0925   GLUCOSE 136 04/20/2017 1422   GLUCOSE 112 (H) 12/05/2012 0953   BUN 13 10/10/2018 1347   BUN 19.8 04/20/2017 1422   CREATININE 0.91 10/10/2018 1347   CREATININE 0.84 07/08/2018 0925   CREATININE 1.0 04/20/2017 1422   CALCIUM 9.2 10/10/2018 1347   CALCIUM 9.0 04/20/2017 1422   GFRNONAA 67 10/10/2018 1347   GFRNONAA 74 07/08/2018 0925   GFRAA 77 10/10/2018 1347   GFRAA 86 07/08/2018 0925   Lab Results  Component Value Date   HGBA1C 6.2 (H) 10/10/2018   HGBA1C 6.3 (H) 07/08/2018   HGBA1C 6.1 (H) 01/25/2018   HGBA1C 6.3 (H) 10/25/2017   HGBA1C 5.6 06/29/2016   Lab Results  Component Value Date   INSULIN 31.9 (H) 10/10/2018   CBC    Component Value Date/Time   WBC 7.0  07/08/2018 0925   RBC 3.84 07/08/2018 0925   HGB 11.9 07/08/2018 0925   HGB 12.7 04/27/2018 1234   HGB 12.9 04/20/2017 1422   HCT 35.4 07/08/2018 0925   HCT 38.7 04/20/2017 1422   PLT 156 07/08/2018 0925   PLT 169 04/27/2018 1234   PLT 168 04/20/2017 1422   MCV 92.2 07/08/2018 0925   MCV 96.0 04/20/2017 1422   MCH 31.0 07/08/2018 0925   MCHC 33.6 07/08/2018 0925   RDW 13.6 07/08/2018 0925   RDW 14.0 04/20/2017 1422   LYMPHSABS 1,008 07/08/2018 0925   LYMPHSABS 1.0 04/20/2017 1422   MONOABS 0.7 06/14/2018 1144   MONOABS 0.6 04/20/2017 1422   EOSABS 350 07/08/2018 0925   EOSABS 0.3 04/20/2017 1422   BASOSABS 63 07/08/2018 0925   BASOSABS 0.0 04/20/2017 1422   Iron/TIBC/Ferritin/ %Sat No results found for: IRON, TIBC, FERRITIN, IRONPCTSAT Lipid Panel     Component Value Date/Time   CHOL 231 (H) 07/08/2018 3086  TRIG 191 (H) 07/08/2018 0925   HDL 49 (L) 07/08/2018 0925   CHOLHDL 4.7 07/08/2018 0925   VLDL 29 06/29/2016 1103   LDLCALC 149 (H) 07/08/2018 0925   Hepatic Function Panel     Component Value Date/Time   PROT 7.5 10/10/2018 1347   PROT 7.4 04/20/2017 1422   ALBUMIN 4.4 10/10/2018 1347   ALBUMIN 3.7 04/20/2017 1422   AST 20 10/10/2018 1347   AST 32 04/20/2017 1422   ALT 12 10/10/2018 1347   ALT 32 (H) 04/21/2017 1120   ALT 35 04/20/2017 1422   ALKPHOS 61 10/10/2018 1347   ALKPHOS 81 04/20/2017 1422   BILITOT 0.5 10/10/2018 1347   BILITOT 0.78 04/20/2017 1422   BILIDIR 0.3 01/12/2012 1137      Component Value Date/Time   TSH 2.21 07/08/2018 0925   TSH 1.53 10/21/2017 1250   TSH 1.39 06/29/2016 1103   Results for JAMELA, CUMBO (MRN [id number]) as of 10/24/2018 17:39  Ref. Range 10/10/2018 13:47  Vitamin D, 25-Hydroxy Latest Ref Range: 30.0 - 100.0 ng/mL 17.0 (L)     OBESITY BEHAVIORAL INTERVENTION VISIT  Today's visit was # 2   Starting weight: 243 lbs Starting date: 10/10/2018 Today's weight : 236 lbs Today's date: 10/24/2018 Total lbs lost  to date: 7    10/24/2018  Height 5\' 7"  (1.702 m)  Weight 236 lb (107 kg)  BMI (Calculated) 36.95  BLOOD PRESSURE - SYSTOLIC 583  BLOOD PRESSURE - DIASTOLIC 81   Body Fat % 09.4 %  Total Body Water (lbs) 88.6 lbs    ASK: We discussed the diagnosis of obesity with [name] today and [name] agreed to give [name] permission to discuss obesity behavioral modification therapy today.  ASSESS: Esthefany has the diagnosis of obesity and her BMI today is 36.95 Gwenyth is in the action stage of change   ADVISE: Yazmyn was educated on the multiple health risks of obesity as well as the benefit of weight loss to improve her health. She was advised of the need for long term treatment and the importance of lifestyle modifications to improve her current health and to decrease her risk of future health problems.  AGREE: Multiple dietary modification options and treatment options were discussed and  [place] agreed to follow the recommendations documented in the above note.  ARRANGE: Idell was educated on the importance of frequent visits to treat obesity as outlined per CMS and USPSTF guidelines and agreed to schedule her next follow up appointment today.  Corey Skains, am acting as Location manager for [employer]. Owens Shark, DO  I have reviewed the above documentation for accuracy and completeness, and I agree with the above. -Jearld Lesch, DO
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
## 2018-10-25 ENCOUNTER — Ambulatory Visit (INDEPENDENT_AMBULATORY_CARE_PROVIDER_SITE_OTHER): Payer: BLUE CROSS/BLUE SHIELD | Admitting: Psychology

## 2018-10-25 DIAGNOSIS — E559 Vitamin D deficiency, unspecified: Secondary | ICD-10-CM

## 2018-10-25 DIAGNOSIS — F3289 Other specified depressive episodes: Secondary | ICD-10-CM

## 2018-10-25 DIAGNOSIS — R7303 Prediabetes: Secondary | ICD-10-CM

## 2018-10-25 DIAGNOSIS — E66812 Obesity, class 2: Secondary | ICD-10-CM | POA: Insufficient documentation

## 2018-10-25 DIAGNOSIS — Z6837 Body mass index (BMI) 37.0-37.9, adult: Secondary | ICD-10-CM

## 2018-10-25 HISTORY — DX: Prediabetes: R73.03

## 2018-10-25 HISTORY — DX: Morbid (severe) obesity due to excess calories: E66.01

## 2018-10-25 HISTORY — DX: Vitamin D deficiency, unspecified: E55.9

## 2018-11-07 NOTE — Progress Notes (Signed)
Office: [phone]  /  Fax: [phone]    Date: 11/09/2018   Time Seen: 9:30am Duration: 25 minutes Provider: Glennie Isle, Psy.D. Type of Session: Individual Therapy  Type of Contact: Face-to-face  Session Content: Tara [name] is a 63 y.o. female presenting for a follow-up appointment to address the previously established treatment goal of decreasing emotional eating. The session was initiated with the administration of the PHQ-9 and GAD-7, as well as a brief check-in. Tara [name] reported she was prescribed Wellbutrin by Dr. Owens Shark, and she is "going to pick it up today." Currently, her primary care physician is reportedly tapering her off of Cymbalta, as she is "sleeping too much." Regarding eating, Tara [name] reported it is going "okay." She discussed eating out several times, but noted, "I am trying to make better choices." During her weigh in with Dr. Owens Shark yesterday, Tara [name] acknowledged gaining 3 pounds. She explained she typically wants "something sweet after dinner." She recalled 3 episodes of emotional eating since the last appointment with this provider and attributed them to boredom. Psychoeducation regarding triggers for emotional eating was provided. Tara [name] was provided a handout, and encouraged to utilize the handout between now and the next appointment to increase awareness of triggers and frequency. Tara [name] agreed. This provider also discussed behavioral strategies for specific triggers, such as placing the utensil down when conversing to avoid mindless eating. Tara [name] was receptive to today's session as evidenced by openness to sharing, responsiveness to feedback, and willingness to explore triggers for emotional eating.  Mental Status Examination: Tara [name] arrived on time for the appointment. She presented as appropriately dressed and groomed. Tara [name] appeared her stated age and demonstrated adequate orientation to time, place, person, and purpose of the appointment. She also demonstrated appropriate eye contact.  No psychomotor abnormalities or behavioral peculiarities noted. Her mood was euthymic with congruent affect. Her thought processes were logical, linear, and goal-directed. No hallucinations, delusions, bizarre thinking or behavior reported or observed. Judgment, insight, and impulse control appeared to be grossly intact. There was no evidence of paraphasias (i.e., errors in speech, gross mispronunciations, and word substitutions), repetition deficits, or disturbances in volume or prosody (i.e., rhythm and intonation). There was no evidence of attention or memory impairments. Tara [name] denied current suicidal and homicidal ideation, plan and intent.   Structured Assessment Results: The Patient Health Questionnaire-9 (PHQ-9) is a self-report measure that assesses symptoms and severity of depression over the course of the last two weeks. Tara [name] obtained a score of 9 suggesting mild depression. Tara [name] finds the endorsed symptoms to be somewhat difficult. Depression screen PHQ 2/9 11/10/2018  Decreased Interest 1  Down, Depressed, Hopeless 0  PHQ - 2 Score 1  Altered sleeping 3  Tired, decreased energy 2  Change in appetite 1  Feeling bad or failure about yourself  0  Trouble concentrating 2  Moving slowly or fidgety/restless 0  Suicidal thoughts 0  PHQ-9 Score 9  Difficult doing work/chores -  Some recent data might be hidden   The Generalized Anxiety Disorder-7 (GAD-7) is a brief self-report measure that assesses symptoms of anxiety over the course of the last two weeks. Tara [name] obtained a score of 3 suggesting minimal anxiety. GAD 7 : Generalized Anxiety Score 11/10/2018  Nervous, Anxious, on Edge 0  Control/stop worrying 1  Worry too much - different things 1  Trouble relaxing 0  Restless 1  Easily annoyed or irritable 0  Afraid - awful might happen 0  Total GAD 7 Score 3  Anxiety Difficulty Not difficult at all  Interventions:  Administration of PHQ-9 and GAD-7 for symptom monitoring Review of  content from the previous session Empathic reflections and validation Psychoeducation regarding triggers for emotional eating Positive reinforcement Rapport building Brief chart review  DSM-5 Diagnosis: 311 (F32.8) Other Specified Depressive Disorder, Emotional Eating Behaviors  Treatment Goal & Progress: During the initial appointment with this provider, the following treatment goal was established: decrease emotional eating. Progress is limited, as Tara [name] has just begun treatment with this provider; however, she is receptive to the interaction and interventions and rapport is being established. Nevertheless, Tara [name] has demonstrated some progress in her goal as evidenced by increased awareness of hunger patterns.  Plan: Tara [name] continues to appear able and willing to participate as evidenced by engagement in reciprocal conversation, and asking questions for clarification as appropriate. The next appointment will be scheduled in two weeks. The next session will focus on reviewing triggers for emotional eating, and working towards the established treatment goal.

## 2018-11-09 ENCOUNTER — Encounter (INDEPENDENT_AMBULATORY_CARE_PROVIDER_SITE_OTHER): Payer: Self-pay | Admitting: Bariatrics

## 2018-11-09 ENCOUNTER — Ambulatory Visit (INDEPENDENT_AMBULATORY_CARE_PROVIDER_SITE_OTHER): Payer: BLUE CROSS/BLUE SHIELD | Admitting: Bariatrics

## 2018-11-09 ENCOUNTER — Other Ambulatory Visit: Payer: Self-pay

## 2018-11-09 VITALS — BP 134/77 | HR 97 | Temp 97.5°F | Ht 67.0 in | Wt 239.0 lb

## 2018-11-09 DIAGNOSIS — Z9189 Other specified personal risk factors, not elsewhere classified: Secondary | ICD-10-CM

## 2018-11-09 DIAGNOSIS — R7303 Prediabetes: Secondary | ICD-10-CM

## 2018-11-09 DIAGNOSIS — F3289 Other specified depressive episodes: Secondary | ICD-10-CM

## 2018-11-09 DIAGNOSIS — Z6837 Body mass index (BMI) 37.0-37.9, adult: Secondary | ICD-10-CM

## 2018-11-09 MED ORDER — BUPROPION HCL ER (SR) 150 MG PO TB12: 150.0000 mg | ORAL_TABLET | Freq: Every day | ORAL | 0 refills | Status: DC

## 2018-11-09 NOTE — Progress Notes (Signed)
Office: [phone]  /  Fax: [phone]   HPI:   Chief Complaint: OBESITY Tara [name] is here to discuss her progress with her obesity treatment plan. She is on the Category 1 plan and is following her eating plan approximately 70 % of the time. She states she is exercising 0 minutes 0 times per week. Tara [name] went out to eat and she had some foods that did not fit the plan. She is up 1.3 pounds in water weight. Tara [name] has not taken her "water pill". Her weight is 239 lb (108.4 kg) today and has had a weight gain of 3 pounds over a period of 2 weeks since her last visit. She has lost 4 lbs since starting treatment with [name].  Pre-Diabetes Tara [name] has a diagnosis of prediabetes based on her elevated Hgb A1c and was informed this puts her at greater risk of developing diabetes. Her last A1c was at 6.2 and last insulin level was at 31.9 She is taking metformin currently and continues to work on diet and exercise to decrease risk of diabetes. She denies nausea or hypoglycemia.  At risk for diabetes Tara [name] is at higher than average risk for developing diabetes due to her obesity and prediabetes. She currently denies polyuria or polydipsia.  Depression with emotional eating behaviors Tara [name] was referred to Dr. Mallie Mussel our bariatric psychologist. She struggles with emotional eating and using food for comfort to the extent that it is negatively impacting her health. She often snacks when she is not hungry. Tara [name] sometimes feels she is out of control and then feels guilty that she made poor food choices. She has been working on behavior modification techniques to help reduce her emotional eating and has been somewhat successful. She shows no sign of suicidal or homicidal ideations.  Depression screen [facility] 2/9 10/25/2018 10/10/2018 07/24/2018 04/20/2018 10/25/2017  Decreased Interest 1 3 - 0 0  Down, Depressed, Hopeless 0 1 1 0 0  PHQ - 2 Score 1 4 1  0 0  Altered sleeping 2 1 0 - -  Tired, decreased energy 2 2 1  - -   Change in appetite 2 3 1  - -  Feeling bad or failure about yourself  2 3 1  - -  Trouble concentrating 2 3 1  - -  Moving slowly or fidgety/restless 0 0 1 - -  Suicidal thoughts 0 1 0 - -  PHQ-9 Score 11 17 6  - -  Difficult doing work/chores - Not difficult at all Somewhat difficult - -  Some recent data might be hidden    ASSESSMENT AND PLAN:  Prediabetes  Other depression - with emotional eating - Plan: buPROPion (WELLBUTRIN SR) 150 MG 12 hr tablet  At risk for diabetes mellitus  Class 2 severe obesity with serious comorbidity and body mass index (BMI) of 37.0 to 37.9 in adult, unspecified obesity type ([place])  PLAN:  Pre-Diabetes Tara [name] will continue to work on weight loss, exercise, and decreasing simple carbohydrates in her diet to help decrease the risk of diabetes. We dicussed metformin including benefits and risks. She was informed that eating too many simple carbohydrates or too many calories at one sitting increases the likelihood of GI side effects. Tara [name] will continue metformin for now and a prescription was not written today. Tara [name] agreed to follow up with [name] as directed to monitor her progress.  Diabetes risk counseling Tara [name] was given extended (15 minutes) diabetes prevention counseling today. She is 63 y.o. female and has risk factors for diabetes including obesity and  prediabetes. We discussed intensive lifestyle modifications today with an emphasis on weight loss as well as increasing exercise and decreasing simple carbohydrates in her diet.  Depression with Emotional Eating Behaviors We discussed behavior modification techniques today to help Tara [name] deal with her emotional eating and depression. Tara [name] will follow up with Dr. Mallie Mussel. She has agreed to take Wellbutrin SR 150 mg qd #30 with no refills and follow up as directed.  Obesity Tara [name] is currently in the action stage of change. As such, her goal is to continue with weight loss efforts She has agreed to follow the  Category 1 plan Tara [name] has been instructed to work up to a goal of 150 minutes of combined cardio and strengthening exercise per week for weight loss and overall health benefits. We discussed the following Behavioral Modification Strategies today: increase H2O intake, no skipping meals, keeping healthy foods in the home, increasing lean protein intake (weighing her meat), decreasing simple carbohydrates, increasing vegetables, decrease eating out, work on meal planning and easy cooking plans and decrease liquid calories Tara [name] will look up her coffee's and add to her snack calories.  Tara [name] has agreed to follow up with our clinic in 2 weeks. She was informed of the importance of frequent follow up visits to maximize her success with intensive lifestyle modifications for her multiple health conditions.  ALLERGIES: Allergies  Allergen Reactions  . Aspirin Other (See Comments)    Pt has a hx of TTP.   . Nsaids Other (See Comments)    Pt has a hx of TTP.     MEDICATIONS: Current Outpatient Medications on File Prior to Visit  Medication Sig Dispense Refill  . acetaminophen (TYLENOL) 500 MG tablet Take 500 mg by mouth every 6 (six) hours as needed for mild pain, moderate pain or headache.     . ADVAIR DISKUS 250-50 MCG/DOSE AEPB INHALE ONE PUFF BY MOUTH TWICE A DAY 60 each 1  . albuterol (PROVENTIL HFA;VENTOLIN HFA) 108 (90 BASE) MCG/ACT inhaler Inhale 2 puffs into the lungs every 6 (six) hours as needed for wheezing or shortness of breath.     Marland Kitchen DIGOX 250 MCG tablet TAKE ONE TABLET BY MOUTH DAILY 60 tablet 3  . DULoxetine (CYMBALTA) 30 MG capsule Take 1 capsule (30 mg total) by mouth daily. 90 capsule 1  . fluticasone (FLONASE) 50 MCG/ACT nasal spray Place 2 sprays into both nostrils daily as needed for rhinitis.    Marland Kitchen gabapentin (NEURONTIN) 300 MG capsule Take 1 capsule (300 mg total) by mouth 2 (two) times daily. 60 capsule 5  . levothyroxine (SYNTHROID, LEVOTHROID) 88 MCG tablet TAKE ONE TABLET BY  MOUTH DAILY 90 tablet 1  . magnesium oxide (MAG-OX) 400 MG tablet Take 400 mg by mouth daily.    . metFORMIN (GLUCOPHAGE) 500 MG tablet TAKE ONE TABLET BY MOUTH TWO TIMES A DAY WITH A MEAL 180 tablet 3  . montelukast (SINGULAIR) 10 MG tablet TAKE ONE TABLET (10 MG) BY MOUTH AT BEDTIME. 90 tablet 2  . Multiple Vitamin (MULTIVITAMIN WITH MINERALS) TABS tablet Take 1 tablet by mouth daily.    . potassium chloride SA (K-DUR,KLOR-CON) 20 MEQ tablet Take 1 tablet (20 mEq total) by mouth daily. 90 tablet 3  . torsemide (DEMADEX) 20 MG tablet TAKE ONE TABLET BY MOUTH EVERY DAY. 90 tablet 1  . Vitamin D, Ergocalciferol, (DRISDOL) 1.25 MG (50000 UT) CAPS capsule Take 1 capsule (50,000 Units total) by mouth every 7 (seven) days. 4 capsule 0   No current  facility-administered medications on file prior to visit.     PAST MEDICAL HISTORY: Past Medical History:  Diagnosis Date  . Allergy   . Anxiety   . Arthritis   . Asthma   . Back pain   . Blood transfusion   . Breast cancer ([place]) 2012   left breast  . Chronic edema   . Cold hands and feet   . Complication of anesthesia    difficulty waking up/dizzy/lightheaded  . Cough   . Diabetes mellitus without complication ([place])    on Metformin-   . Dysrhythmia    irregular heartbeat, takes digoxin  . Environmental allergies   . Eye pain   . Fatigue   . GERD (gastroesophageal reflux disease)    occasionally   . H/O varicella   . H/O varicose veins   . Hepatic cirrhosis ([place])   . Hepatitis C   . History of measles, mumps, or rubella   . Hoarseness   . Hx of radiation therapy 12/15/11 - 01/29/12   left breast  . Hypothyroidism   . Irregular heart beat    under control  . Joint pain   . Leg cramps   . Neuromuscular disorder ([place])    left foot nerve damage- neuropathy   . Osteopenia   . Palpitations   . Pre-diabetes   . Rheumatoid arthritis ([place])   . Ringing in ear   . Shortness of breath   . Swallowing difficulty   . Swelling of both  lower extremities   . Thrombocytopenia, primary ([place])   . TTP (thrombotic thrombocytopenic purpura) ([place])     PAST SURGICAL HISTORY: Past Surgical History:  Procedure Laterality Date  . BREAST LUMPECTOMY  08/03/11   left lumpectomy and slnbx,T1cN0,triple pos  . COLONOSCOPY    . elbow pins Left   . FOOT SURGERY Left    multiple  . POLYPECTOMY    . PORTACATH PLACEMENT  08/03/2011   Procedure: INSERTION PORT-A-CATH;  Surgeon: Merrie Roof, MD;  Location: [facility];  Service: General;  Laterality: Right;  . portacath removal    . TOTAL HIP ARTHROPLASTY Bilateral   . UPPER GASTROINTESTINAL ENDOSCOPY      SOCIAL HISTORY: Social History   Tobacco Use  . Smoking status: Never Smoker  . Smokeless tobacco: Never Used  Substance Use Topics  . Alcohol use: Yes    Alcohol/week: 1.0 standard drinks    Types: 1 Glasses of wine per week    Comment: occ  . Drug use: No    FAMILY HISTORY: Family History  Problem Relation Age of Onset  . Pneumonia Mother   . COPD Mother   . Colon polyps Mother   . Heart disease Mother   . Thyroid disease Mother   . Obesity Mother   . COPD Father   . Obesity Father   . Brain cancer Maternal Grandfather   . Alcohol abuse Maternal Grandfather   . Hyperlipidemia Neg Hx   . Hypertension Neg Hx   . Colon cancer Neg Hx   . Rectal cancer Neg Hx   . Stomach cancer Neg Hx   . Esophageal cancer Neg Hx     ROS: Review of Systems  Constitutional: Negative for weight loss.  Gastrointestinal: Negative for nausea.  Genitourinary: Negative for frequency.  Endo/Heme/Allergies: Negative for polydipsia.       Negative for hypoglycemia  Psychiatric/Behavioral: Positive for depression. Negative for suicidal ideas.    PHYSICAL EXAM: Blood pressure 134/77, pulse 97, temperature (!)  97.5 F (36.4 C), temperature source Oral, height 5\' 7"  (1.702 m), weight 239 lb (108.4 kg), SpO2 94 %. Body mass index is 37.43 kg/m. Physical Exam Vitals signs reviewed.   Constitutional:      Appearance: Normal appearance. She is well-developed. She is obese.  Cardiovascular:     Rate and Rhythm: Normal rate.  Pulmonary:     Effort: Pulmonary effort is normal.  Musculoskeletal: Normal range of motion.  Skin:    General: Skin is warm and dry.  Neurological:     Mental Status: She is alert and oriented to person, place, and time.  Psychiatric:        Mood and Affect: Mood normal.        Behavior: Behavior normal.        Thought Content: Thought content does not include homicidal or suicidal ideation.     RECENT LABS AND TESTS: BMET    Component Value Date/Time   NA 137 10/10/2018 1347   NA 136 04/20/2017 1422   K 4.7 10/10/2018 1347   K 4.0 04/20/2017 1422   CL 94 (L) 10/10/2018 1347   CL 108 (H) 12/05/2012 0953   CO2 22 10/10/2018 1347   CO2 28 04/20/2017 1422   GLUCOSE 104 (H) 10/10/2018 1347   GLUCOSE 111 (H) 07/08/2018 0925   GLUCOSE 136 04/20/2017 1422   GLUCOSE 112 (H) 12/05/2012 0953   BUN 13 10/10/2018 1347   BUN 19.8 04/20/2017 1422   CREATININE 0.91 10/10/2018 1347   CREATININE 0.84 07/08/2018 0925   CREATININE 1.0 04/20/2017 1422   CALCIUM 9.2 10/10/2018 1347   CALCIUM 9.0 04/20/2017 1422   GFRNONAA 67 10/10/2018 1347   GFRNONAA 74 07/08/2018 0925   GFRAA 77 10/10/2018 1347   GFRAA 86 07/08/2018 0925   Lab Results  Component Value Date   HGBA1C 6.2 (H) 10/10/2018   HGBA1C 6.3 (H) 07/08/2018   HGBA1C 6.1 (H) 01/25/2018   HGBA1C 6.3 (H) 10/25/2017   HGBA1C 5.6 06/29/2016   Lab Results  Component Value Date   INSULIN 31.9 (H) 10/10/2018   CBC    Component Value Date/Time   WBC 7.0 07/08/2018 0925   RBC 3.84 07/08/2018 0925   HGB 11.9 07/08/2018 0925   HGB 12.7 04/27/2018 1234   HGB 12.9 04/20/2017 1422   HCT 35.4 07/08/2018 0925   HCT 38.7 04/20/2017 1422   PLT 156 07/08/2018 0925   PLT 169 04/27/2018 1234   PLT 168 04/20/2017 1422   MCV 92.2 07/08/2018 0925   MCV 96.0 04/20/2017 1422   MCH 31.0  07/08/2018 0925   MCHC 33.6 07/08/2018 0925   RDW 13.6 07/08/2018 0925   RDW 14.0 04/20/2017 1422   LYMPHSABS 1,008 07/08/2018 0925   LYMPHSABS 1.0 04/20/2017 1422   MONOABS 0.7 06/14/2018 1144   MONOABS 0.6 04/20/2017 1422   EOSABS 350 07/08/2018 0925   EOSABS 0.3 04/20/2017 1422   BASOSABS 63 07/08/2018 0925   BASOSABS 0.0 04/20/2017 1422   Iron/TIBC/Ferritin/ %Sat No results found for: IRON, TIBC, FERRITIN, IRONPCTSAT Lipid Panel     Component Value Date/Time   CHOL 231 (H) 07/08/2018 0925   TRIG 191 (H) 07/08/2018 0925   HDL 49 (L) 07/08/2018 0925   CHOLHDL 4.7 07/08/2018 0925   VLDL 29 06/29/2016 1103   LDLCALC 149 (H) 07/08/2018 0925   Hepatic Function Panel     Component Value Date/Time   PROT 7.5 10/10/2018 1347   PROT 7.4 04/20/2017 1422   ALBUMIN 4.4 10/10/2018  1347   ALBUMIN 3.7 04/20/2017 1422   AST 20 10/10/2018 1347   AST 32 04/20/2017 1422   ALT 12 10/10/2018 1347   ALT 32 (H) 04/21/2017 1120   ALT 35 04/20/2017 1422   ALKPHOS 61 10/10/2018 1347   ALKPHOS 81 04/20/2017 1422   BILITOT 0.5 10/10/2018 1347   BILITOT 0.78 04/20/2017 1422   BILIDIR 0.3 01/12/2012 1137      Component Value Date/Time   TSH 2.21 07/08/2018 0925   TSH 1.53 10/21/2017 1250   TSH 1.39 06/29/2016 1103   Results for MARKELLE, ASARO (MRN [id number]) as of 11/09/2018 16:07  Ref. Range 10/10/2018 13:47  Vitamin D, 25-Hydroxy Latest Ref Range: 30.0 - 100.0 ng/mL 17.0 (L)     OBESITY BEHAVIORAL INTERVENTION VISIT  Today's visit was # 3   Starting weight: 243 lbs Starting date: 10/10/2018 Today's weight : 239 lbs Today's date: 11/09/2018 Total lbs lost to date: 4    11/09/2018  Height 5\' 7"  (1.702 m)  Weight 239 lb (108.4 kg)  BMI (Calculated) 37.42  BLOOD PRESSURE - SYSTOLIC 381  BLOOD PRESSURE - DIASTOLIC 77   Body Fat % 50 %  Total Body Water (lbs) 89.9 lbs       ASK: We discussed the diagnosis of obesity with [name] today and [name] agreed to give [name]  permission to discuss obesity behavioral modification therapy today.  ASSESS: Gabriel has the diagnosis of obesity and her BMI today is 48.42 Massie is in the action stage of change   ADVISE: Naly was educated on the multiple health risks of obesity as well as the benefit of weight loss to improve her health. She was advised of the need for long term treatment and the importance of lifestyle modifications to improve her current health and to decrease her risk of future health problems.  AGREE: Multiple dietary modification options and treatment options were discussed and  Leialoha agreed to follow the recommendations documented in the above note.  ARRANGE: Yalexa was educated on the importance of frequent visits to treat obesity as outlined per CMS and USPSTF guidelines and agreed to schedule her next follow up appointment today.  Corey Skains, am acting as Location manager for [employer]. Owens Shark, DO  I have reviewed the above documentation for accuracy and completeness, and I agree with the above. -Jearld Lesch, DO

## 2018-11-10 ENCOUNTER — Ambulatory Visit (INDEPENDENT_AMBULATORY_CARE_PROVIDER_SITE_OTHER): Payer: BLUE CROSS/BLUE SHIELD | Admitting: Psychology

## 2018-11-10 ENCOUNTER — Other Ambulatory Visit: Payer: Self-pay

## 2018-11-10 DIAGNOSIS — F3289 Other specified depressive episodes: Secondary | ICD-10-CM

## 2018-11-17 ENCOUNTER — Other Ambulatory Visit: Payer: Self-pay | Admitting: Internal Medicine

## 2018-11-20 ENCOUNTER — Other Ambulatory Visit (INDEPENDENT_AMBULATORY_CARE_PROVIDER_SITE_OTHER): Payer: Self-pay | Admitting: Bariatrics

## 2018-11-20 DIAGNOSIS — E559 Vitamin D deficiency, unspecified: Secondary | ICD-10-CM

## 2018-11-21 ENCOUNTER — Other Ambulatory Visit: Payer: Self-pay | Admitting: Internal Medicine

## 2018-11-23 ENCOUNTER — Encounter (INDEPENDENT_AMBULATORY_CARE_PROVIDER_SITE_OTHER): Payer: Self-pay

## 2018-11-24 ENCOUNTER — Other Ambulatory Visit: Payer: Self-pay | Admitting: Internal Medicine

## 2018-12-04 ENCOUNTER — Other Ambulatory Visit (INDEPENDENT_AMBULATORY_CARE_PROVIDER_SITE_OTHER): Payer: Self-pay | Admitting: Bariatrics

## 2018-12-04 DIAGNOSIS — E559 Vitamin D deficiency, unspecified: Secondary | ICD-10-CM

## 2018-12-05 ENCOUNTER — Ambulatory Visit (INDEPENDENT_AMBULATORY_CARE_PROVIDER_SITE_OTHER): Payer: Self-pay | Admitting: Bariatrics

## 2018-12-05 ENCOUNTER — Ambulatory Visit (INDEPENDENT_AMBULATORY_CARE_PROVIDER_SITE_OTHER): Payer: Self-pay | Admitting: Psychology

## 2018-12-06 ENCOUNTER — Other Ambulatory Visit (INDEPENDENT_AMBULATORY_CARE_PROVIDER_SITE_OTHER): Payer: Self-pay | Admitting: Bariatrics

## 2018-12-06 ENCOUNTER — Encounter (INDEPENDENT_AMBULATORY_CARE_PROVIDER_SITE_OTHER): Payer: Self-pay

## 2018-12-06 DIAGNOSIS — F3289 Other specified depressive episodes: Secondary | ICD-10-CM

## 2018-12-14 ENCOUNTER — Telehealth: Payer: Self-pay | Admitting: Gastroenterology

## 2018-12-14 ENCOUNTER — Encounter: Payer: Self-pay | Admitting: Internal Medicine

## 2018-12-14 ENCOUNTER — Telehealth: Payer: Self-pay | Admitting: Internal Medicine

## 2018-12-14 DIAGNOSIS — M545 Low back pain: Secondary | ICD-10-CM

## 2018-12-14 NOTE — Telephone Encounter (Signed)
Please advise 

## 2018-12-14 NOTE — Telephone Encounter (Signed)
Spoke to patient and informed her that she does still need colonoscopy. We will contact her in May with an appointment.

## 2018-12-14 NOTE — Telephone Encounter (Signed)
Patient called would like to know if she needs to have her recall colonoscopy in May. She had one on 10-14-18 and you advised for her to have another one in May. Would you find it urgent to schedule a colonoscopy or hold if off until after the [place] pandemic is over?

## 2018-12-14 NOTE — Telephone Encounter (Signed)
Given her polyp history and my concern for residual advanced polyp remaining in her colon, I recommend that she have a colonoscopy in May.  Thank you.

## 2018-12-14 NOTE — Telephone Encounter (Signed)
Patient previously was on Cymbalta 60 mg daily for chronic back pain.  She had tapered it over the past few months to 30 mg daily.  She had previously received November an epidural steroid injection by Dr. Ermalene Postin, neurologist affiliated with [facility] with good relief.  Now says back pain is coming back.  She is not sure if she can have another steroid injection.  I think it would be possible but they may not be doing these during the pandemic.Marland Kitchen  She should check with Dr. Conrad [place] office and it is okay to increase Cymbalta from 30 mg to 60 mg daily.

## 2018-12-15 ENCOUNTER — Encounter (INDEPENDENT_AMBULATORY_CARE_PROVIDER_SITE_OTHER): Payer: Self-pay | Admitting: Family Medicine

## 2018-12-15 ENCOUNTER — Other Ambulatory Visit: Payer: Self-pay

## 2018-12-15 ENCOUNTER — Ambulatory Visit (INDEPENDENT_AMBULATORY_CARE_PROVIDER_SITE_OTHER): Payer: BLUE CROSS/BLUE SHIELD | Admitting: Family Medicine

## 2018-12-15 DIAGNOSIS — E559 Vitamin D deficiency, unspecified: Secondary | ICD-10-CM

## 2018-12-15 DIAGNOSIS — Z6837 Body mass index (BMI) 37.0-37.9, adult: Secondary | ICD-10-CM | POA: Diagnosis not present

## 2018-12-15 DIAGNOSIS — F3289 Other specified depressive episodes: Secondary | ICD-10-CM

## 2018-12-18 ENCOUNTER — Other Ambulatory Visit: Payer: Self-pay | Admitting: Internal Medicine

## 2018-12-19 ENCOUNTER — Encounter (INDEPENDENT_AMBULATORY_CARE_PROVIDER_SITE_OTHER): Payer: Self-pay | Admitting: Family Medicine

## 2018-12-19 NOTE — Telephone Encounter (Signed)
Please advise on 90 day script

## 2018-12-20 ENCOUNTER — Other Ambulatory Visit (INDEPENDENT_AMBULATORY_CARE_PROVIDER_SITE_OTHER): Payer: Self-pay

## 2018-12-20 DIAGNOSIS — F3289 Other specified depressive episodes: Secondary | ICD-10-CM

## 2018-12-20 MED ORDER — BUPROPION HCL ER (SR) 150 MG PO TB12: 150.0000 mg | ORAL_TABLET | Freq: Every day | ORAL | 0 refills | Status: DC

## 2018-12-20 NOTE — Telephone Encounter (Signed)
That's fine- maybe it was a chart I didn't bring in :)

## 2018-12-21 NOTE — Progress Notes (Signed)
Office: [phone]  /  Fax: [phone] TeleHealth Visit:  Tara [name] has verbally consented to this TeleHealth visit today. The patient is located at home, the provider is located at the [employer] [facility] office. The participants in this visit include the listed provider and patient. The visit was conducted today via face time.  HPI:   Chief Complaint: OBESITY Tara [name] is here to discuss her progress with her obesity treatment plan. She is on the Category 1 plan and is following her eating plan approximately 60 % of the time. She states she is exercising 0 minutes 0 times per week. Tara [name] voices the last few weeks she has lost interest in following the plan, and she is trying to get back on track. She is looking to start putting food together alone and empty her freezer secondary to not wanting to buy more food.  We were unable to weigh the patient today for this TeleHealth visit. She feels as if she has gained 2 lbs since her last visit. She has lost 4 lbs since starting treatment with [name].  Vitamin D Deficiency Tara [name] has a diagnosis of vitamin D deficiency. She is currently taking prescription Vit D. She notes fatigue and denies nausea, vomiting or muscle weakness.  Depression with Emotional Eating Behaviors Tara [name] is on Wellbutrin and notes symptoms are moderately controlled, but she is doing boredom snacking. Tara [name] struggles with emotional eating and using food for comfort to the extent that it is negatively impacting her health. She often snacks when she is not hungry. Tara [name] sometimes feels she is out of control and then feels guilty that she made poor food choices. She has been working on behavior modification techniques to help reduce her emotional eating and has been somewhat successful. She shows no sign of suicidal or homicidal ideations.  Depression screen Tara [name] 2/9 11/10/2018 10/25/2018 10/10/2018 07/24/2018 04/20/2018  Decreased Interest 1 1 3  - 0  Down, Depressed, Hopeless 0 0 1 1  0  PHQ - 2 Score 1 1 4 1  0  Altered sleeping 3 2 1  0 -  Tired, decreased energy 2 2 2 1  -  Change in appetite 1 2 3 1  -  Feeling bad or failure about yourself  0 2 3 1  -  Trouble concentrating 2 2 3 1  -  Moving slowly or fidgety/restless 0 0 0 1 -  Suicidal thoughts 0 0 1 0 -  PHQ-9 Score 9 11 17 6  -  Difficult doing work/chores - - Not difficult at all Somewhat difficult -  Some recent data might be hidden    ASSESSMENT AND PLAN:  Vitamin D deficiency  Other depression - with emotional eating - Plan: buPROPion (WELLBUTRIN SR) 150 MG 12 hr tablet  Class 2 severe obesity with serious comorbidity and body mass index (BMI) of 37.0 to 37.9 in adult, unspecified obesity type (HCC)  PLAN:  Vitamin D Deficiency Tara [name] was informed that low vitamin D levels contributes to fatigue and are associated with obesity, breast, and colon cancer. Tara [name] agrees to continue taking prescription Vit D @50 ,000 IU every week and will follow up for routine testing of vitamin D, at least 2-3 times per year. She was informed of the risk of over-replacement of vitamin D and agrees to not increase her dose unless she discusses this with [name] first. Tara [name] agrees to follow up with our clinic in 2 weeks.  Depression with Emotional Eating Behaviors We discussed behavior modification techniques today to help Tara [name] deal with  her emotional eating and depression. Tara [name] agrees to continue taking Wellbutrin SR 150 mg qd #30 and we will refill for 1 month. Tara [name] agrees to follow up with our clinic in 2 weeks.  Obesity Tara [name] is currently in the action stage of change. As such, her goal is to continue with weight loss efforts She has agreed to keep a food journal with 350-450 calories and 25+ grams of protein at supper daily and follow the Category 1 plan Tara [name] has been instructed to work up to a goal of 150 minutes of combined cardio and strengthening exercise per week for weight loss and overall health benefits. We discussed the  following Behavioral Modification Strategies today: increasing lean protein intake, increasing vegetables, work on meal planning and easy cooking plans, emotional eating strategies, ways to avoid night time snacking, better snacking choices, and planning for success   Tara [name] has agreed to follow up with our clinic in 2 weeks. She was informed of the importance of frequent follow up visits to maximize her success with intensive lifestyle modifications for her multiple health conditions.  ALLERGIES: Allergies  Allergen Reactions  . Aspirin Other (See Comments)    Pt has a hx of TTP.   . Nsaids Other (See Comments)    Pt has a hx of TTP.     MEDICATIONS: Current Outpatient Medications on File Prior to Visit  Medication Sig Dispense Refill  . acetaminophen (TYLENOL) 500 MG tablet Take 500 mg by mouth every 6 (six) hours as needed for mild pain, moderate pain or headache.     . ADVAIR DISKUS 250-50 MCG/DOSE AEPB INHALE ONE PUFF BY MOUTH TWICE A DAY 60 each 1  . albuterol (PROVENTIL HFA;VENTOLIN HFA) 108 (90 BASE) MCG/ACT inhaler Inhale 2 puffs into the lungs every 6 (six) hours as needed for wheezing or shortness of breath.     . digoxin (LANOXIN) 0.25 MG tablet TAKE ONE TABLET BY MOUTH DAILY 60 tablet 2  . DULoxetine (CYMBALTA) 30 MG capsule Take 1 capsule (30 mg total) by mouth daily. 90 capsule 1  . fluticasone (FLONASE) 50 MCG/ACT nasal spray Place 2 sprays into both nostrils daily as needed for rhinitis.    Marland Kitchen gabapentin (NEURONTIN) 300 MG capsule TAKE ONE CAPSULE BY MOUTH TWICE A DAY 60 capsule 4  . levothyroxine (SYNTHROID, LEVOTHROID) 88 MCG tablet TAKE ONE TABLET BY MOUTH DAILY 90 tablet 0  . magnesium oxide (MAG-OX) 400 MG tablet Take 400 mg by mouth daily.    . metFORMIN (GLUCOPHAGE) 500 MG tablet TAKE ONE TABLET BY MOUTH TWO TIMES A DAY WITH A MEAL 180 tablet 3  . montelukast (SINGULAIR) 10 MG tablet TAKE ONE TABLET (10 MG) BY MOUTH AT BEDTIME. 90 tablet 2  . Multiple Vitamin  (MULTIVITAMIN WITH MINERALS) TABS tablet Take 1 tablet by mouth daily.    . potassium chloride SA (K-DUR,KLOR-CON) 20 MEQ tablet TAKE ONE TABLET BY MOUTH DAILY 90 tablet 2  . Vitamin D, Ergocalciferol, (DRISDOL) 1.25 MG (50000 UT) CAPS capsule TAKE ONE CAPSULE BY MOUTH EVERY 7 DAYS 2 capsule 0   No current facility-administered medications on file prior to visit.     PAST MEDICAL HISTORY: Past Medical History:  Diagnosis Date  . Allergy   . Anxiety   . Arthritis   . Asthma   . Back pain   . Blood transfusion   . Breast cancer ([place]) 2012   left breast  . Chronic edema   . Cold hands and feet   .  Complication of anesthesia    difficulty waking up/dizzy/lightheaded  . Cough   . Diabetes mellitus without complication ([place])    on Metformin-   . Dysrhythmia    irregular heartbeat, takes digoxin  . Environmental allergies   . Eye pain   . Fatigue   . GERD (gastroesophageal reflux disease)    occasionally   . H/O varicella   . H/O varicose veins   . Hepatic cirrhosis ([name])   . Hepatitis C   . History of measles, mumps, or rubella   . Hoarseness   . Hx of radiation therapy 12/15/11 - 01/29/12   left breast  . Hypothyroidism   . Irregular heart beat    under control  . Joint pain   . Leg cramps   . Neuromuscular disorder ([place])    left foot nerve damage- neuropathy   . Osteopenia   . Palpitations   . Pre-diabetes   . Rheumatoid arthritis ([place])   . Ringing in ear   . Shortness of breath   . Swallowing difficulty   . Swelling of both lower extremities   . Thrombocytopenia, primary ([place])   . TTP (thrombotic thrombocytopenic purpura) ([place])     PAST SURGICAL HISTORY: Past Surgical History:  Procedure Laterality Date  . BREAST LUMPECTOMY  08/03/11   left lumpectomy and slnbx,T1cN0,triple pos  . COLONOSCOPY    . elbow pins Left   . FOOT SURGERY Left    multiple  . POLYPECTOMY    . PORTACATH PLACEMENT  08/03/2011   Procedure: INSERTION PORT-A-CATH;  Surgeon: Merrie Roof, MD;  Location: [facility];  Service: General;  Laterality: Right;  . portacath removal    . TOTAL HIP ARTHROPLASTY Bilateral   . UPPER GASTROINTESTINAL ENDOSCOPY      SOCIAL HISTORY: Social History   Tobacco Use  . Smoking status: Never Smoker  . Smokeless tobacco: Never Used  Substance Use Topics  . Alcohol use: Yes    Alcohol/week: 1.0 standard drinks    Types: 1 Glasses of wine per week    Comment: occ  . Drug use: No    FAMILY HISTORY: Family History  Problem Relation Age of Onset  . Pneumonia Mother   . COPD Mother   . Colon polyps Mother   . Heart disease Mother   . Thyroid disease Mother   . Obesity Mother   . COPD Father   . Obesity Father   . Brain cancer Maternal Grandfather   . Alcohol abuse Maternal Grandfather   . Hyperlipidemia Neg Hx   . Hypertension Neg Hx   . Colon cancer Neg Hx   . Rectal cancer Neg Hx   . Stomach cancer Neg Hx   . Esophageal cancer Neg Hx     ROS: Review of Systems  Constitutional: Positive for malaise/fatigue. Negative for weight loss.  Gastrointestinal: Negative for nausea and vomiting.  Musculoskeletal:       Negative muscle weakness  Psychiatric/Behavioral: Positive for depression. Negative for suicidal ideas.    PHYSICAL EXAM: Pt in no acute distress  RECENT LABS AND TESTS: BMET    Component Value Date/Time   NA 137 10/10/2018 1347   NA 136 04/20/2017 1422   K 4.7 10/10/2018 1347   K 4.0 04/20/2017 1422   CL 94 (L) 10/10/2018 1347   CL 108 (H) 12/05/2012 0953   CO2 22 10/10/2018 1347   CO2 28 04/20/2017 1422   GLUCOSE 104 (H) 10/10/2018 1347   GLUCOSE 111 (H)  07/08/2018 0925   GLUCOSE 136 04/20/2017 1422   GLUCOSE 112 (H) 12/05/2012 0953   BUN 13 10/10/2018 1347   BUN 19.8 04/20/2017 1422   CREATININE 0.91 10/10/2018 1347   CREATININE 0.84 07/08/2018 0925   CREATININE 1.0 04/20/2017 1422   CALCIUM 9.2 10/10/2018 1347   CALCIUM 9.0 04/20/2017 1422   GFRNONAA 67 10/10/2018 1347   GFRNONAA 74 07/08/2018  0925   GFRAA 77 10/10/2018 1347   GFRAA 86 07/08/2018 0925   Lab Results  Component Value Date   HGBA1C 6.2 (H) 10/10/2018   HGBA1C 6.3 (H) 07/08/2018   HGBA1C 6.1 (H) 01/25/2018   HGBA1C 6.3 (H) 10/25/2017   HGBA1C 5.6 06/29/2016   Lab Results  Component Value Date   INSULIN 31.9 (H) 10/10/2018   CBC    Component Value Date/Time   WBC 7.0 07/08/2018 0925   RBC 3.84 07/08/2018 0925   HGB 11.9 07/08/2018 0925   HGB 12.7 04/27/2018 1234   HGB 12.9 04/20/2017 1422   HCT 35.4 07/08/2018 0925   HCT 38.7 04/20/2017 1422   PLT 156 07/08/2018 0925   PLT 169 04/27/2018 1234   PLT 168 04/20/2017 1422   MCV 92.2 07/08/2018 0925   MCV 96.0 04/20/2017 1422   MCH 31.0 07/08/2018 0925   MCHC 33.6 07/08/2018 0925   RDW 13.6 07/08/2018 0925   RDW 14.0 04/20/2017 1422   LYMPHSABS 1,008 07/08/2018 0925   LYMPHSABS 1.0 04/20/2017 1422   MONOABS 0.7 06/14/2018 1144   MONOABS 0.6 04/20/2017 1422   EOSABS 350 07/08/2018 0925   EOSABS 0.3 04/20/2017 1422   BASOSABS 63 07/08/2018 0925   BASOSABS 0.0 04/20/2017 1422   Iron/TIBC/Ferritin/ %Sat No results found for: IRON, TIBC, FERRITIN, IRONPCTSAT Lipid Panel     Component Value Date/Time   CHOL 231 (H) 07/08/2018 0925   TRIG 191 (H) 07/08/2018 0925   HDL 49 (L) 07/08/2018 0925   CHOLHDL 4.7 07/08/2018 0925   VLDL 29 06/29/2016 1103   LDLCALC 149 (H) 07/08/2018 0925   Hepatic Function Panel     Component Value Date/Time   PROT 7.5 10/10/2018 1347   PROT 7.4 04/20/2017 1422   ALBUMIN 4.4 10/10/2018 1347   ALBUMIN 3.7 04/20/2017 1422   AST 20 10/10/2018 1347   AST 32 04/20/2017 1422   ALT 12 10/10/2018 1347   ALT 32 (H) 04/21/2017 1120   ALT 35 04/20/2017 1422   ALKPHOS 61 10/10/2018 1347   ALKPHOS 81 04/20/2017 1422   BILITOT 0.5 10/10/2018 1347   BILITOT 0.78 04/20/2017 1422   BILIDIR 0.3 01/12/2012 1137      Component Value Date/Time   TSH 2.21 07/08/2018 0925   TSH 1.53 10/21/2017 1250   TSH 1.39 06/29/2016 1103       I, Trixie Dredge, am acting as Location manager for Ilene Qua, MD  I have reviewed the above documentation for accuracy and completeness, and I agree with the above. - Ilene Qua, MD

## 2018-12-25 MED ORDER — BUPROPION HCL ER (SR) 150 MG PO TB12: 150.0000 mg | ORAL_TABLET | Freq: Every day | ORAL | 0 refills | Status: DC

## 2018-12-28 ENCOUNTER — Other Ambulatory Visit: Payer: Self-pay

## 2018-12-28 ENCOUNTER — Encounter (INDEPENDENT_AMBULATORY_CARE_PROVIDER_SITE_OTHER): Payer: Self-pay | Admitting: Family Medicine

## 2018-12-28 ENCOUNTER — Ambulatory Visit (INDEPENDENT_AMBULATORY_CARE_PROVIDER_SITE_OTHER): Payer: BLUE CROSS/BLUE SHIELD | Admitting: Family Medicine

## 2018-12-28 DIAGNOSIS — E559 Vitamin D deficiency, unspecified: Secondary | ICD-10-CM | POA: Diagnosis not present

## 2018-12-28 DIAGNOSIS — R7303 Prediabetes: Secondary | ICD-10-CM | POA: Diagnosis not present

## 2018-12-28 DIAGNOSIS — Z6837 Body mass index (BMI) 37.0-37.9, adult: Secondary | ICD-10-CM

## 2018-12-28 NOTE — Progress Notes (Signed)
Office: [phone]  /  Fax: [phone] TeleHealth Visit:  [name] has verbally consented to this TeleHealth visit today. The patient is located at home, the provider is located at the [employer] [facility] office. The participants in this visit include the listed provider and patient. The visit was conducted today via face time.  HPI:   Chief Complaint: OBESITY Tara [name] is here to discuss her progress with her obesity treatment plan. She is on the keep a food journal with 350-450 calories and 25+ grams of protein at supper daily and follow the Category 1 plan and is following her eating plan approximately 65 % of the time. She states she is exercising 0 minutes 0 times per week. Tara [name] weighs in at 240 lbs today. She feels she hasn't wanted to change habits of snacking and indulging. For breakfast and lunch, she is following the plan, but she is snacking between meals and falling back into old habits at dinner like casseroles.  We were unable to weigh the patient today for this TeleHealth visit. She feels as if she has maintained her weight since her last visit. She has lost 4 lbs since starting treatment with [name].  Pre-Diabetes Tara [name] has a diagnosis of pre-diabetes based on her elevated Hgb A1c of 6.2 and insulin of 31.9. She was informed this puts her at greater risk of developing diabetes. She is taking metformin currently and she states Wellbutrin is helping control cravings for carbohydrate snacks. She continues to work on diet and exercise to decrease risk of diabetes. She denies nausea or hypoglycemia.  Vitamin D Deficiency Tara [name] has a diagnosis of vitamin D deficiency. She is currently taking prescription Vit D. She notes fatigue and denies nausea, vomiting or muscle weakness.  ASSESSMENT AND PLAN:  Prediabetes  Vitamin D deficiency  Class 2 severe obesity with serious comorbidity and body mass index (BMI) of 37.0 to 37.9 in adult, unspecified obesity type [name])  PLAN:   Pre-Diabetes Tara [name] will continue to work on weight loss, exercise, and decreasing simple carbohydrates in her diet to help decrease the risk of diabetes. We dicussed metformin including benefits and risks. She was informed that eating too many simple carbohydrates or too many calories at one sitting increases the likelihood of GI side effects. Tara [name] agrees to continue her medications and we will retest Hgb A1c and insulin at the end of May. Tara [name] agrees to follow up with our clinic in 2 weeks as directed to monitor her progress.  Vitamin D Deficiency Tara [name] was informed that low vitamin D levels contributes to fatigue and are associated with obesity, breast, and colon cancer. Tara [name] agrees to continue taking prescription Vit D @50 ,000 IU every week, no refill needed. She will follow up for routine testing of vitamin D, at least 2-3 times per year. She was informed of the risk of over-replacement of vitamin D and agrees to not increase her dose unless she discusses this with [name] first. Tara [name] agrees to follow up with our clinic in 2 weeks.  Obesity Tara [name] is currently in the action stage of change. As such, her goal is to continue with weight loss efforts She has agreed to follow the Category 1 plan Tara [name] has been instructed to work up to a goal of 150 minutes of combined cardio and strengthening exercise per week for weight loss and overall health benefits. We discussed the following Behavioral Modification Strategies today: increasing lean protein intake, decreasing simple carbohydrates  and work on meal planning and easy  cooking plans, keeping healthy foods in the home, better snacking choices, and planning for success   Tara [name] has agreed to follow up with our clinic in 2 weeks. She was informed of the importance of frequent follow up visits to maximize her success with intensive lifestyle modifications for her multiple health conditions.  ALLERGIES: Allergies  Allergen Reactions  . Aspirin Other (See  Comments)    Pt has a hx of TTP.   . Nsaids Other (See Comments)    Pt has a hx of TTP.     MEDICATIONS: Current Outpatient Medications on File Prior to Visit  Medication Sig Dispense Refill  . acetaminophen (TYLENOL) 500 MG tablet Take 500 mg by mouth every 6 (six) hours as needed for mild pain, moderate pain or headache.     . ADVAIR DISKUS 250-50 MCG/DOSE AEPB INHALE ONE PUFF BY MOUTH TWICE A DAY 60 each 1  . albuterol (PROVENTIL HFA;VENTOLIN HFA) 108 (90 BASE) MCG/ACT inhaler Inhale 2 puffs into the lungs every 6 (six) hours as needed for wheezing or shortness of breath.     Marland Kitchen buPROPion (WELLBUTRIN SR) 150 MG 12 hr tablet Take 1 tablet (150 mg total) by mouth daily. 30 tablet 0  . digoxin (LANOXIN) 0.25 MG tablet TAKE ONE TABLET BY MOUTH DAILY 60 tablet 2  . DULoxetine (CYMBALTA) 30 MG capsule Take 1 capsule (30 mg total) by mouth daily. 90 capsule 1  . fluticasone (FLONASE) 50 MCG/ACT nasal spray Place 2 sprays into both nostrils daily as needed for rhinitis.    Marland Kitchen gabapentin (NEURONTIN) 300 MG capsule TAKE ONE CAPSULE BY MOUTH TWICE A DAY 60 capsule 4  . levothyroxine (SYNTHROID, LEVOTHROID) 88 MCG tablet TAKE ONE TABLET BY MOUTH DAILY 90 tablet 0  . magnesium oxide (MAG-OX) 400 MG tablet Take 400 mg by mouth daily.    . metFORMIN (GLUCOPHAGE) 500 MG tablet TAKE ONE TABLET BY MOUTH TWO TIMES A DAY WITH A MEAL 180 tablet 3  . montelukast (SINGULAIR) 10 MG tablet TAKE ONE TABLET (10 MG) BY MOUTH AT BEDTIME. 90 tablet 2  . Multiple Vitamin (MULTIVITAMIN WITH MINERALS) TABS tablet Take 1 tablet by mouth daily.    . potassium chloride SA (K-DUR,KLOR-CON) 20 MEQ tablet TAKE ONE TABLET BY MOUTH DAILY 90 tablet 2  . torsemide (DEMADEX) 20 MG tablet TAKE ONE TABLET BY MOUTH EVERY DAY. 90 tablet 1  . Vitamin D, Ergocalciferol, (DRISDOL) 1.25 MG (50000 UT) CAPS capsule TAKE ONE CAPSULE BY MOUTH EVERY 7 DAYS 2 capsule 0   No current facility-administered medications on file prior to visit.      PAST MEDICAL HISTORY: Past Medical History:  Diagnosis Date  . Allergy   . Anxiety   . Arthritis   . Asthma   . Back pain   . Blood transfusion   . Breast cancer ([place]) 2012   left breast  . Chronic edema   . Cold hands and feet   . Complication of anesthesia    difficulty waking up/dizzy/lightheaded  . Cough   . Diabetes mellitus without complication ([place])    on Metformin-   . Dysrhythmia    irregular heartbeat, takes digoxin  . Environmental allergies   . Eye pain   . Fatigue   . GERD (gastroesophageal reflux disease)    occasionally   . H/O varicella   . H/O varicose veins   . Hepatic cirrhosis ([place])   . Hepatitis C   . History of measles, mumps, or rubella   .  Hoarseness   . Hx of radiation therapy 12/15/11 - 01/29/12   left breast  . Hypothyroidism   . Irregular heart beat    under control  . Joint pain   . Leg cramps   . Neuromuscular disorder ([place])    left foot nerve damage- neuropathy   . Osteopenia   . Palpitations   . Pre-diabetes   . Rheumatoid arthritis ([place])   . Ringing in ear   . Shortness of breath   . Swallowing difficulty   . Swelling of both lower extremities   . Thrombocytopenia, primary ([place])   . TTP (thrombotic thrombocytopenic purpura) ([place])     PAST SURGICAL HISTORY: Past Surgical History:  Procedure Laterality Date  . BREAST LUMPECTOMY  08/03/11   left lumpectomy and slnbx,T1cN0,triple pos  . COLONOSCOPY    . elbow pins Left   . FOOT SURGERY Left    multiple  . POLYPECTOMY    . PORTACATH PLACEMENT  08/03/2011   Procedure: INSERTION PORT-A-CATH;  Surgeon: Merrie Roof, MD;  Location: [facility];  Service: General;  Laterality: Right;  . portacath removal    . TOTAL HIP ARTHROPLASTY Bilateral   . UPPER GASTROINTESTINAL ENDOSCOPY      SOCIAL HISTORY: Social History   Tobacco Use  . Smoking status: Never Smoker  . Smokeless tobacco: Never Used  Substance Use Topics  . Alcohol use: Yes    Alcohol/week: 1.0 standard drinks     Types: 1 Glasses of wine per week    Comment: occ  . Drug use: No    FAMILY HISTORY: Family History  Problem Relation Age of Onset  . Pneumonia Mother   . COPD Mother   . Colon polyps Mother   . Heart disease Mother   . Thyroid disease Mother   . Obesity Mother   . COPD Father   . Obesity Father   . Brain cancer Maternal Grandfather   . Alcohol abuse Maternal Grandfather   . Hyperlipidemia Neg Hx   . Hypertension Neg Hx   . Colon cancer Neg Hx   . Rectal cancer Neg Hx   . Stomach cancer Neg Hx   . Esophageal cancer Neg Hx     ROS: Review of Systems  Constitutional: Positive for malaise/fatigue. Negative for weight loss.  Gastrointestinal: Negative for nausea and vomiting.  Musculoskeletal:       Negative muscle weakness  Endo/Heme/Allergies:       Negative hypoglycemia    PHYSICAL EXAM: Pt in no acute distress  RECENT LABS AND TESTS: BMET    Component Value Date/Time   NA 137 10/10/2018 1347   NA 136 04/20/2017 1422   K 4.7 10/10/2018 1347   K 4.0 04/20/2017 1422   CL 94 (L) 10/10/2018 1347   CL 108 (H) 12/05/2012 0953   CO2 22 10/10/2018 1347   CO2 28 04/20/2017 1422   GLUCOSE 104 (H) 10/10/2018 1347   GLUCOSE 111 (H) 07/08/2018 0925   GLUCOSE 136 04/20/2017 1422   GLUCOSE 112 (H) 12/05/2012 0953   BUN 13 10/10/2018 1347   BUN 19.8 04/20/2017 1422   CREATININE 0.91 10/10/2018 1347   CREATININE 0.84 07/08/2018 0925   CREATININE 1.0 04/20/2017 1422   CALCIUM 9.2 10/10/2018 1347   CALCIUM 9.0 04/20/2017 1422   GFRNONAA 67 10/10/2018 1347   GFRNONAA 74 07/08/2018 0925   GFRAA 77 10/10/2018 1347   GFRAA 86 07/08/2018 0925   Lab Results  Component Value Date   HGBA1C 6.2 (  H) 10/10/2018   HGBA1C 6.3 (H) 07/08/2018   HGBA1C 6.1 (H) 01/25/2018   HGBA1C 6.3 (H) 10/25/2017   HGBA1C 5.6 06/29/2016   Lab Results  Component Value Date   INSULIN 31.9 (H) 10/10/2018   CBC    Component Value Date/Time   WBC 7.0 07/08/2018 0925   RBC 3.84 07/08/2018  0925   HGB 11.9 07/08/2018 0925   HGB 12.7 04/27/2018 1234   HGB 12.9 04/20/2017 1422   HCT 35.4 07/08/2018 0925   HCT 38.7 04/20/2017 1422   PLT 156 07/08/2018 0925   PLT 169 04/27/2018 1234   PLT 168 04/20/2017 1422   MCV 92.2 07/08/2018 0925   MCV 96.0 04/20/2017 1422   MCH 31.0 07/08/2018 0925   MCHC 33.6 07/08/2018 0925   RDW 13.6 07/08/2018 0925   RDW 14.0 04/20/2017 1422   LYMPHSABS 1,008 07/08/2018 0925   LYMPHSABS 1.0 04/20/2017 1422   MONOABS 0.7 06/14/2018 1144   MONOABS 0.6 04/20/2017 1422   EOSABS 350 07/08/2018 0925   EOSABS 0.3 04/20/2017 1422   BASOSABS 63 07/08/2018 0925   BASOSABS 0.0 04/20/2017 1422   Iron/TIBC/Ferritin/ %Sat No results found for: IRON, TIBC, FERRITIN, IRONPCTSAT Lipid Panel     Component Value Date/Time   CHOL 231 (H) 07/08/2018 0925   TRIG 191 (H) 07/08/2018 0925   HDL 49 (L) 07/08/2018 0925   CHOLHDL 4.7 07/08/2018 0925   VLDL 29 06/29/2016 1103   LDLCALC 149 (H) 07/08/2018 0925   Hepatic Function Panel     Component Value Date/Time   PROT 7.5 10/10/2018 1347   PROT 7.4 04/20/2017 1422   ALBUMIN 4.4 10/10/2018 1347   ALBUMIN 3.7 04/20/2017 1422   AST 20 10/10/2018 1347   AST 32 04/20/2017 1422   ALT 12 10/10/2018 1347   ALT 32 (H) 04/21/2017 1120   ALT 35 04/20/2017 1422   ALKPHOS 61 10/10/2018 1347   ALKPHOS 81 04/20/2017 1422   BILITOT 0.5 10/10/2018 1347   BILITOT 0.78 04/20/2017 1422   BILIDIR 0.3 01/12/2012 1137      Component Value Date/Time   TSH 2.21 07/08/2018 0925   TSH 1.53 10/21/2017 1250   TSH 1.39 06/29/2016 1103      I, Trixie Dredge, am acting as Location manager for Ilene Qua, MD  I have reviewed the above documentation for accuracy and completeness, and I agree with the above. - Ilene Qua, MD

## 2019-01-04 ENCOUNTER — Telehealth: Payer: Self-pay | Admitting: *Deleted

## 2019-01-04 NOTE — Telephone Encounter (Signed)
Spoke with patient, scheduled patient for prop colon on 5/28 at 1:00 pm and previsit on 5/12 at 11:00 am. Patient denies additional questions at the time of the call.

## 2019-01-05 ENCOUNTER — Encounter: Payer: Self-pay | Admitting: *Deleted

## 2019-01-09 ENCOUNTER — Other Ambulatory Visit: Payer: Self-pay

## 2019-01-09 ENCOUNTER — Other Ambulatory Visit: Payer: BLUE CROSS/BLUE SHIELD | Admitting: Internal Medicine

## 2019-01-09 DIAGNOSIS — E119 Type 2 diabetes mellitus without complications: Secondary | ICD-10-CM

## 2019-01-09 DIAGNOSIS — R7303 Prediabetes: Secondary | ICD-10-CM

## 2019-01-09 DIAGNOSIS — E782 Mixed hyperlipidemia: Secondary | ICD-10-CM

## 2019-01-10 ENCOUNTER — Other Ambulatory Visit: Payer: Self-pay

## 2019-01-10 ENCOUNTER — Ambulatory Visit: Payer: BLUE CROSS/BLUE SHIELD | Admitting: *Deleted

## 2019-01-10 VITALS — Ht 67.0 in | Wt 239.0 lb

## 2019-01-10 DIAGNOSIS — Z86010 Personal history of colonic polyps: Secondary | ICD-10-CM

## 2019-01-10 MED ORDER — NA SULFATE-K SULFATE-MG SULF 17.5-3.13-1.6 GM/177ML PO SOLN: 1.0000 | Freq: Once | ORAL | 0 refills | Status: AC

## 2019-01-10 NOTE — Progress Notes (Signed)
No egg or soy allergy known to patient   issues with past sedation with any surgeries  or procedures of diff waking , no intubation problems  No diet pills per patient No home 02 use per patient  No blood thinners per patient  Pt denies issues with constipation  No A fib or A flutter  EMMI video sent to pt's e mail    Pt mailed instruction packet to included paper to complete and mail back to [facility] with addressed and stamped envelope, Emmi video, copy of consent form to read and not return, and instructions. Suprep PNM $50  coupon mailed in packet. PV completed over the phone. Pt encouraged to call with questions or issues

## 2019-01-11 ENCOUNTER — Ambulatory Visit (INDEPENDENT_AMBULATORY_CARE_PROVIDER_SITE_OTHER): Payer: BLUE CROSS/BLUE SHIELD | Admitting: Family Medicine

## 2019-01-11 ENCOUNTER — Encounter (INDEPENDENT_AMBULATORY_CARE_PROVIDER_SITE_OTHER): Payer: Self-pay | Admitting: Family Medicine

## 2019-01-11 DIAGNOSIS — R7303 Prediabetes: Secondary | ICD-10-CM | POA: Diagnosis not present

## 2019-01-11 DIAGNOSIS — E7849 Other hyperlipidemia: Secondary | ICD-10-CM | POA: Diagnosis not present

## 2019-01-11 DIAGNOSIS — Z6837 Body mass index (BMI) 37.0-37.9, adult: Secondary | ICD-10-CM

## 2019-01-11 NOTE — Progress Notes (Signed)
Office: [phone]  /  Fax: [phone] TeleHealth Visit:  Tara [name] has verbally consented to this TeleHealth visit today. The patient is located at home, the provider is located at the [employer] [facility] office. The participants in this visit include the listed provider and patient. The visit was conducted today via face time.  HPI:   Chief Complaint: OBESITY Tara [name] is here to discuss her progress with her obesity treatment plan. She is on the Category 1 plan and is following her eating plan approximately 60 % of the time. She states she is exercising 0 minutes 0 times per week. Tara [name] has been trying to scrupulously follow the plan every other day, but tends to struggle with dinner. She is interested in options for dinner where she can put food together herself. Her weight was 240 lbs this morning.  We were unable to weigh the patient today for this TeleHealth visit. She feels as if she has maintained her weight since her last visit. She has lost 4 lbs since starting treatment with [name].  Pre-Diabetes Tara [name] has a diagnosis of pre-diabetes based on her elevated Hgb A1c of 5.8 and was informed this puts her at greater risk of developing diabetes. She denies GI side effects of metformin and continues to work on diet and exercise to decrease risk of diabetes. She denies hypoglycemia.  Hyperlipidemia Tara [name] has hyperlipidemia and has been trying to improve her cholesterol levels with intensive lifestyle modification including a low saturated fat diet, exercise and weight loss. Last LDL was of 151 and she is not on statin. She denies any chest pain, claudication or myalgias.  ASSESSMENT AND PLAN:  Prediabetes  Other hyperlipidemia  Class 2 severe obesity with serious comorbidity and body mass index (BMI) of 37.0 to 37.9 in adult, unspecified obesity type [facility])  PLAN:  Pre-Diabetes Tara [name] will continue to work on weight loss, exercise, and decreasing simple carbohydrates in her diet  to help decrease the risk of diabetes. We dicussed metformin including benefits and risks. She was informed that eating too many simple carbohydrates or too many calories at one sitting increases the likelihood of GI side effects. Tara [name] agrees to continue taking metformin, and she agrees to follow up with our clinic in 2 weeks as directed to monitor her progress.  Hyperlipidemia Tara [name] was informed of the American Heart Association Guidelines emphasizing intensive lifestyle modifications as the first line treatment for hyperlipidemia. We discussed many lifestyle modifications today in depth, and Tara [name] will continue to work on decreasing saturated fats such as fatty red meat, butter and many fried foods. She will also increase vegetables and lean protein in her diet and continue to work on exercise and weight loss efforts. We will repeat labs at her first in person appointment. We will follow up with Tara [name] to discuss initiation of statin. Tara [name] agrees to follow up with our clinic in 2 weeks.  Obesity Tara [name] is currently in the action stage of change. As such, her goal is to continue with weight loss efforts She has agreed to keep a food journal with 300-400 calories and 30+ grams of protein at supper daily and follow the Category 1 plan Tara [name] has been instructed to work up to a goal of 150 minutes of combined cardio and strengthening exercise per week or start doing for 15-30 minutes of recombinant bike for weight loss and overall health benefits. We discussed the following Behavioral Modification Strategies today: increasing lean protein intake, increasing vegetables and work on meal planning  and easy cooking plans, keeping healthy foods in the home, and planning for success   Tara [name] has agreed to follow up with our clinic in 2 weeks. She was informed of the importance of frequent follow up visits to maximize her success with intensive lifestyle modifications for her multiple health conditions.  ALLERGIES:  Allergies  Allergen Reactions  . Aspirin Other (See Comments)    Pt has a hx of TTP.   . Nsaids Other (See Comments)    Pt has a hx of TTP.     MEDICATIONS: Current Outpatient Medications on File Prior to Visit  Medication Sig Dispense Refill  . acetaminophen (TYLENOL) 500 MG tablet Take 500 mg by mouth every 6 (six) hours as needed for mild pain, moderate pain or headache.     . ADVAIR DISKUS 250-50 MCG/DOSE AEPB INHALE ONE PUFF BY MOUTH TWICE A DAY 60 each 1  . albuterol (PROVENTIL HFA;VENTOLIN HFA) 108 (90 BASE) MCG/ACT inhaler Inhale 2 puffs into the lungs every 6 (six) hours as needed for wheezing or shortness of breath.     Marland Kitchen buPROPion (WELLBUTRIN SR) 150 MG 12 hr tablet Take 1 tablet (150 mg total) by mouth daily. 30 tablet 0  . digoxin (LANOXIN) 0.25 MG tablet TAKE ONE TABLET BY MOUTH DAILY 60 tablet 2  . DULoxetine (CYMBALTA) 30 MG capsule Take 1 capsule (30 mg total) by mouth daily. 90 capsule 1  . DULoxetine (CYMBALTA) 60 MG capsule Take 60 mg by mouth daily.    . fluticasone (FLONASE) 50 MCG/ACT nasal spray Place 2 sprays into both nostrils daily as needed for rhinitis.    Marland Kitchen gabapentin (NEURONTIN) 300 MG capsule TAKE ONE CAPSULE BY MOUTH TWICE A DAY 60 capsule 4  . levothyroxine (SYNTHROID, LEVOTHROID) 88 MCG tablet TAKE ONE TABLET BY MOUTH DAILY 90 tablet 0  . magnesium oxide (MAG-OX) 400 MG tablet Take 400 mg by mouth daily.    . metFORMIN (GLUCOPHAGE) 500 MG tablet TAKE ONE TABLET BY MOUTH TWO TIMES A DAY WITH A MEAL 180 tablet 3  . montelukast (SINGULAIR) 10 MG tablet TAKE ONE TABLET (10 MG) BY MOUTH AT BEDTIME. 90 tablet 2  . Multiple Vitamin (MULTIVITAMIN WITH MINERALS) TABS tablet Take 1 tablet by mouth daily.    . potassium chloride SA (K-DUR,KLOR-CON) 20 MEQ tablet TAKE ONE TABLET BY MOUTH DAILY 90 tablet 2  . torsemide (DEMADEX) 20 MG tablet TAKE ONE TABLET BY MOUTH EVERY DAY. 90 tablet 1  . Vitamin D, Ergocalciferol, (DRISDOL) 1.25 MG (50000 UT) CAPS capsule TAKE  ONE CAPSULE BY MOUTH EVERY 7 DAYS 2 capsule 0   No current facility-administered medications on file prior to visit.     PAST MEDICAL HISTORY: Past Medical History:  Diagnosis Date  . Allergy   . Anxiety   . Arthritis   . Asthma   . Back pain   . Blood transfusion   . Breast cancer ([place]) 2012   left breast  . Chronic edema   . Cold hands and feet   . Complication of anesthesia    difficulty waking up/dizzy/lightheaded  . Cough   . Diabetes mellitus without complication ([place])    on Metformin-   . Dysrhythmia    irregular heartbeat, takes digoxin  . Environmental allergies   . Eye pain   . Fatigue   . GERD (gastroesophageal reflux disease)    occasionally   . H/O varicella   . H/O varicose veins   . Hepatic cirrhosis ([place])   .  Hepatitis C   . History of chemotherapy 2013   left breast cancer  . History of measles, mumps, or rubella   . Hoarseness   . Hx of radiation therapy 12/15/11 - 01/29/12   left breast  . Hypothyroidism   . Irregular heart beat    under control  . Joint pain   . Leg cramps   . Neuromuscular disorder ([place])    left foot nerve damage- neuropathy   . Osteopenia   . Palpitations   . Pre-diabetes   . Rheumatoid arthritis ([place])   . Ringing in ear   . Shortness of breath   . Swallowing difficulty   . Swelling of both lower extremities   . Thrombocytopenia, primary ([place])   . TTP (thrombotic thrombocytopenic purpura) ([place])     PAST SURGICAL HISTORY: Past Surgical History:  Procedure Laterality Date  . BREAST LUMPECTOMY  08/03/11   left lumpectomy and slnbx,T1cN0,triple pos  . COLONOSCOPY    . elbow pins Left   . FOOT SURGERY Left    multiple  . POLYPECTOMY    . PORTACATH PLACEMENT  08/03/2011   Procedure: INSERTION PORT-A-CATH;  Surgeon: Merrie Roof, MD;  Location: [facility];  Service: General;  Laterality: Right;  . portacath removal    . TOTAL HIP ARTHROPLASTY Bilateral   . UPPER GASTROINTESTINAL ENDOSCOPY      SOCIAL HISTORY:  Social History   Tobacco Use  . Smoking status: Never Smoker  . Smokeless tobacco: Never Used  Substance Use Topics  . Alcohol use: Yes    Alcohol/week: 1.0 standard drinks    Types: 1 Glasses of wine per week    Comment: occ  . Drug use: No    FAMILY HISTORY: Family History  Problem Relation Age of Onset  . Pneumonia Mother   . COPD Mother   . Colon polyps Mother   . Heart disease Mother   . Thyroid disease Mother   . Obesity Mother   . COPD Father   . Obesity Father   . Brain cancer Maternal Grandfather   . Alcohol abuse Maternal Grandfather   . Hyperlipidemia Neg Hx   . Hypertension Neg Hx   . Colon cancer Neg Hx   . Rectal cancer Neg Hx   . Stomach cancer Neg Hx   . Esophageal cancer Neg Hx     ROS: Review of Systems  Constitutional: Negative for weight loss.  Cardiovascular: Negative for chest pain and claudication.  Musculoskeletal: Negative for myalgias.  Endo/Heme/Allergies:       Negative hypoglycemia    PHYSICAL EXAM: Pt in no acute distress  RECENT LABS AND TESTS: BMET    Component Value Date/Time   NA 137 10/10/2018 1347   NA 136 04/20/2017 1422   K 4.7 10/10/2018 1347   K 4.0 04/20/2017 1422   CL 94 (L) 10/10/2018 1347   CL 108 (H) 12/05/2012 0953   CO2 22 10/10/2018 1347   CO2 28 04/20/2017 1422   GLUCOSE 104 (H) 10/10/2018 1347   GLUCOSE 111 (H) 07/08/2018 0925   GLUCOSE 136 04/20/2017 1422   GLUCOSE 112 (H) 12/05/2012 0953   BUN 13 10/10/2018 1347   BUN 19.8 04/20/2017 1422   CREATININE 0.91 10/10/2018 1347   CREATININE 0.84 07/08/2018 0925   CREATININE 1.0 04/20/2017 1422   CALCIUM 9.2 10/10/2018 1347   CALCIUM 9.0 04/20/2017 1422   GFRNONAA 67 10/10/2018 1347   GFRNONAA 74 07/08/2018 0925   GFRAA 77 10/10/2018 1347  GFRAA 86 07/08/2018 0925   Lab Results  Component Value Date   HGBA1C 5.8 (H) 01/09/2019   HGBA1C 6.2 (H) 10/10/2018   HGBA1C 6.3 (H) 07/08/2018   HGBA1C 6.1 (H) 01/25/2018   HGBA1C 6.3 (H) 10/25/2017    Lab Results  Component Value Date   INSULIN 31.9 (H) 10/10/2018   CBC    Component Value Date/Time   WBC 7.0 07/08/2018 0925   RBC 3.84 07/08/2018 0925   HGB 11.9 07/08/2018 0925   HGB 12.7 04/27/2018 1234   HGB 12.9 04/20/2017 1422   HCT 35.4 07/08/2018 0925   HCT 38.7 04/20/2017 1422   PLT 156 07/08/2018 0925   PLT 169 04/27/2018 1234   PLT 168 04/20/2017 1422   MCV 92.2 07/08/2018 0925   MCV 96.0 04/20/2017 1422   MCH 31.0 07/08/2018 0925   MCHC 33.6 07/08/2018 0925   RDW 13.6 07/08/2018 0925   RDW 14.0 04/20/2017 1422   LYMPHSABS 1,008 07/08/2018 0925   LYMPHSABS 1.0 04/20/2017 1422   MONOABS 0.7 06/14/2018 1144   MONOABS 0.6 04/20/2017 1422   EOSABS 350 07/08/2018 0925   EOSABS 0.3 04/20/2017 1422   BASOSABS 63 07/08/2018 0925   BASOSABS 0.0 04/20/2017 1422   Iron/TIBC/Ferritin/ %Sat No results found for: IRON, TIBC, FERRITIN, IRONPCTSAT Lipid Panel     Component Value Date/Time   CHOL 236 (H) 01/09/2019 0924   TRIG 230 (H) 01/09/2019 0924   HDL 46 (L) 01/09/2019 0924   CHOLHDL 5.1 (H) 01/09/2019 0924   VLDL 29 06/29/2016 1103   LDLCALC 151 (H) 01/09/2019 0924   Hepatic Function Panel     Component Value Date/Time   PROT 6.9 01/09/2019 0924   PROT 7.5 10/10/2018 1347   PROT 7.4 04/20/2017 1422   ALBUMIN 4.4 10/10/2018 1347   ALBUMIN 3.7 04/20/2017 1422   AST 14 01/09/2019 0924   AST 32 04/20/2017 1422   ALT 18 01/09/2019 0924   ALT 32 (H) 04/21/2017 1120   ALT 35 04/20/2017 1422   ALKPHOS 61 10/10/2018 1347   ALKPHOS 81 04/20/2017 1422   BILITOT 0.9 01/09/2019 0924   BILITOT 0.5 10/10/2018 1347   BILITOT 0.78 04/20/2017 1422   BILIDIR 0.2 01/09/2019 0924   IBILI 0.7 01/09/2019 0924      Component Value Date/Time   TSH 2.21 07/08/2018 0925   TSH 1.53 10/21/2017 1250   TSH 1.39 06/29/2016 1103      I, Trixie Dredge, am acting as Location manager for Ilene Qua, MD   I have reviewed the above documentation for accuracy and  completeness, and I agree with the above. - Ilene Qua, MD

## 2019-01-12 ENCOUNTER — Encounter: Payer: Self-pay | Admitting: Gastroenterology

## 2019-01-13 ENCOUNTER — Encounter: Payer: BLUE CROSS/BLUE SHIELD | Admitting: Internal Medicine

## 2019-01-13 LAB — HEPATIC FUNCTION PANEL
AG Ratio: 1.7 (calc) (ref 1.0–2.5)
ALT: 18 U/L (ref 6–29)
AST: 14 U/L (ref 10–35)
Albumin: 4.3 g/dL (ref 3.6–5.1)
Alkaline phosphatase (APISO): 48 U/L (ref 37–153)
Bilirubin, Direct: 0.2 mg/dL (ref 0.0–0.2)
Globulin: 2.6 g/dL (calc) (ref 1.9–3.7)
Indirect Bilirubin: 0.7 mg/dL (calc) (ref 0.2–1.2)
Total Bilirubin: 0.9 mg/dL (ref 0.2–1.2)
Total Protein: 6.9 g/dL (ref 6.1–8.1)

## 2019-01-13 LAB — TEST AUTHORIZATION

## 2019-01-13 LAB — HEMOGLOBIN A1C
Hgb A1c MFr Bld: 5.8 % of total Hgb — ABNORMAL HIGH (ref <5.7)
Mean Plasma Glucose: 120 (calc)
eAG (mmol/L): 6.6 (calc)

## 2019-01-13 LAB — LIPID PANEL
Cholesterol: 236 mg/dL — ABNORMAL HIGH (ref <200)
HDL: 46 mg/dL — ABNORMAL LOW (ref >=50)
LDL Cholesterol (Calc): 151 mg/dL (calc) — ABNORMAL HIGH
Non-HDL Cholesterol (Calc): 190 mg/dL (calc) — ABNORMAL HIGH (ref <130)
Total CHOL/HDL Ratio: 5.1 (calc) — ABNORMAL HIGH (ref <5.0)
Triglycerides: 230 mg/dL — ABNORMAL HIGH (ref <150)

## 2019-01-13 LAB — MICROALBUMIN / CREATININE URINE RATIO
Creatinine, Urine: 218 mg/dL (ref 20–275)
Microalb Creat Ratio: 14 mcg/mg creat (ref <30)
Microalb, Ur: 3.1 mg/dL

## 2019-01-13 LAB — TSH: TSH: 3.15 mIU/L (ref 0.40–4.50)

## 2019-01-16 ENCOUNTER — Encounter: Payer: Self-pay | Admitting: Internal Medicine

## 2019-01-16 ENCOUNTER — Other Ambulatory Visit: Payer: Self-pay

## 2019-01-16 ENCOUNTER — Ambulatory Visit (INDEPENDENT_AMBULATORY_CARE_PROVIDER_SITE_OTHER): Payer: BLUE CROSS/BLUE SHIELD | Admitting: Internal Medicine

## 2019-01-16 VITALS — BP 118/80 | HR 98 | Temp 98.6°F | Ht 67.0 in | Wt 237.0 lb

## 2019-01-16 DIAGNOSIS — Z8619 Personal history of other infectious and parasitic diseases: Secondary | ICD-10-CM | POA: Diagnosis not present

## 2019-01-16 DIAGNOSIS — Z853 Personal history of malignant neoplasm of breast: Secondary | ICD-10-CM

## 2019-01-16 DIAGNOSIS — E559 Vitamin D deficiency, unspecified: Secondary | ICD-10-CM

## 2019-01-16 DIAGNOSIS — Z86010 Personal history of colonic polyps: Secondary | ICD-10-CM

## 2019-01-16 DIAGNOSIS — R8290 Unspecified abnormal findings in urine: Secondary | ICD-10-CM

## 2019-01-16 DIAGNOSIS — F411 Generalized anxiety disorder: Secondary | ICD-10-CM

## 2019-01-16 DIAGNOSIS — F3289 Other specified depressive episodes: Secondary | ICD-10-CM

## 2019-01-16 DIAGNOSIS — Z96643 Presence of artificial hip joint, bilateral: Secondary | ICD-10-CM

## 2019-01-16 DIAGNOSIS — R609 Edema, unspecified: Secondary | ICD-10-CM

## 2019-01-16 DIAGNOSIS — Z0000 Encounter for general adult medical examination without abnormal findings: Secondary | ICD-10-CM | POA: Diagnosis not present

## 2019-01-16 DIAGNOSIS — J4520 Mild intermittent asthma, uncomplicated: Secondary | ICD-10-CM

## 2019-01-16 DIAGNOSIS — E039 Hypothyroidism, unspecified: Secondary | ICD-10-CM | POA: Diagnosis not present

## 2019-01-16 DIAGNOSIS — J302 Other seasonal allergic rhinitis: Secondary | ICD-10-CM

## 2019-01-16 DIAGNOSIS — E782 Mixed hyperlipidemia: Secondary | ICD-10-CM

## 2019-01-16 DIAGNOSIS — R7302 Impaired glucose tolerance (oral): Secondary | ICD-10-CM

## 2019-01-16 LAB — POCT URINALYSIS DIPSTICK
Bilirubin, UA: NEGATIVE
Glucose, UA: NEGATIVE
Ketones, UA: NEGATIVE
Nitrite, UA: NEGATIVE
Protein, UA: POSITIVE — AB
Spec Grav, UA: 1.01 (ref 1.010–1.025)
Urobilinogen, UA: 0.2 E.U./dL
pH, UA: 6.5 (ref 5.0–8.0)

## 2019-01-16 NOTE — Progress Notes (Signed)
Subjective:    Patient ID: Tara [name], female    DOB: 15-Apr-1956, 63 y.o.   MRN: [id number]  HPI 63 year old Female for health maintenance exam and evaluation of medical issues. Sees Dr. Adair Patter for obesity but really is not committed to losing weight at this point. Encouraged her trying.  Recently had recurrence of left parathoracic pain that improved with increased dose of Cymbalta.  History of chronic hepatitis C treated with Harvoni.  Patient thinks she contracted by a blood transfusion for TTP while living in [place].  Liver functions are stable.  History of breast cancer followed by Dr. Jana Hakim.  A colonoscopy in February.  Apparently a small fragment of tubular adenoma remained and she is to have repeat colonoscopy in the near future.  History of obesity, hypothyroidism, allergic rhinitis, asthma, status post bilateral hip replacements.  Right hip replaced 2005 and left done in 2007.  History of depression treated with Celexa.  History of dependent edema.  History of eczema left foot.  History of TTP when she was 63 years of age.  Did not have splenectomy.  Patient says she had a recurrence at age 19 while being treated with interferon for hepatitis C.  History of left arm lymphedema secondary to breast cancer surgery.  History of fractured left elbow with pins placed.  Left foot reconstruction 2008.  Dr. Jana Hakim referred her to me in 2013 for primary care.  Patient found a mass in her left breast while residing in [place] in 2012.  Mammogram showed suspicious abnormality in the left breast measuring about 10 mm and physical exam showed a 1 cm firm superficial mass in the axillary region of the breast.  Biopsy was performed in October 2012 at [facility].  She had invasive ductal carcinoma grade 3 estrogen receptor positive progesterone receptor positive.  HER-2  positive with ratio of 2.5.  Subsequently had definitive left lumpectomy and sentinel node sampling  December 2012.  2 sentinel nodes were negative for tumor.  Decision was made to proceed with 4 cycles of adjuvant chemotherapy.  Chemotherapy was started in [place] in 2013 after she moved here from [place].  She subsequently developed cellulitis in her left breast and was treated with Keflex.  She also received radiation therapy here.  Social history: She does not smoke.  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  Husband is a Dance movement psychotherapist.  She drinks a glass of wine twice weekly.  History of impaired glucose tolerance.  Has been referred to diabetes treatment center for counseling in the past regarding glucose intolerance.  In July 2017 she was admitted to the hospital with hypokalemia thought to be secondary to diarrhea.  Presumably had viral gastroenteritis with more than 10 bowel movements daily.  Was treated with potassium repletion and improved with IV fluids.  Subsequently had a fall at home when she was sick with gastroenteritis.  She suffered a nondisplaced right posterior lateral eighth rib fracture.  History of left lower lobe pneumonia January 2017 treated as an outpatient.  In December 2016 she had a CT of the chest with contrast for complaint of back and chest pain for 18 months.  Study showed no evidence of metastatic disease or acute findings.  Has had epidural steroid injection by Dr. Alvy Bimler in [facility] for that with some success and pain relief.  Family history: Mother with history of skin cancer but patient not sure what  type.  Father died of COPD complications.  All of her sisters and younger brother apparently healthy.  Social history: She does not smoke.  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  Husband is a Dance movement psychotherapist.  She drinks a glass of wine twice weekly.            Hx Vitamin D deficiency. Was checked in Feb and was 17. Taking 3000 units qod. Needs to  take supplement daily.   Snacks late afternoon and evening.  Saw psychologist at weight loss center regarding barriers to weight loss..  Review of Systems  Constitutional: Positive for fatigue.  Respiratory: Negative.   Cardiovascular: Negative.   Gastrointestinal: Negative.   Genitourinary: Negative.   Musculoskeletal: Positive for arthralgias and back pain.  Hematological: Negative.    Back pain improved with incerased Cymbalta.     Objective:   Physical Exam Vitals signs reviewed.  Constitutional:      General: She is not in acute distress.    Appearance: She is not ill-appearing, toxic-appearing or diaphoretic.  HENT:     Head: Normocephalic and atraumatic.     Right Ear: Tympanic membrane and ear canal normal.     Left Ear: Tympanic membrane and ear canal normal.     Nose: Nose normal. No congestion.     Mouth/Throat:     Mouth: Mucous membranes are moist.     Pharynx: Oropharynx is clear.  Eyes:     General: No scleral icterus.       Right eye: No discharge.        Left eye: No discharge.     Extraocular Movements: Extraocular movements intact.     Conjunctiva/sclera: Conjunctivae normal.     Pupils: Pupils are equal, round, and reactive to light.  Neck:     Musculoskeletal: No neck rigidity.  Cardiovascular:     Heart sounds: No murmur.  Pulmonary:     Effort: Pulmonary effort is normal.     Breath sounds: Normal breath sounds.  Abdominal:     General: Bowel sounds are normal.     Palpations: Abdomen is soft.     Tenderness: There is no abdominal tenderness. There is no guarding.  Genitourinary:    Rectum: Normal.     Comments: Bimanual normal.  Pap deferred Musculoskeletal:        General: No swelling or deformity.  Skin:    General: Skin is warm and dry.     Coloration: Skin is not pale.     Findings: No rash.  Neurological:     General: No focal deficit present.     Mental Status: She is alert and oriented to person, place, and time.     Cranial  Nerves: No cranial nerve deficit.     Motor: No weakness.  Psychiatric:        Mood and Affect: Mood normal.        Behavior: Behavior normal.        Thought Content: Thought content normal.        Judgment: Judgment normal.           Assessment & Plan:  BMI 37.12-once again reinforced importance of diet exercise and weight loss but she does not seem motivated.  Encouraged her to continue follow-up with Dr. Adair Patter  History of Hepatitis C treated with Harvoni and liver functions are normal  History of breast cancer seen by Dr. Jana Hakim  History of  parathoracic back pain treated with epidural steroid with success and currently on Cymbalta for back pain  History of TTP in the remote past.  CBC is within normal limits  Impaired glucose tolerance-fasting glucose is normal.  Hemoglobin A1c not done with this visit  Hypothyroidism-TSH is within normal limits  Anxiety  History of asthma  History of allergic rhinitis  Status post bilateral hip replacements  Remote history of cardiac dysrhythmia treated with digoxin.  Today she is to discontinue this to see if she if she so desires  Dependent edema-likely due to venous insufficiency and aggravated by obesity  History of tubular adenomas-to have repeat colonoscopy in the near future  Plan: Once again reinforced importance of diet exercise and weight loss.  Upcoming colonoscopy for follow-up on tubular adenomas.  Return in 1 year or as needed.  Continue current medications.  Recommend flu vaccine in early fall.

## 2019-01-17 ENCOUNTER — Other Ambulatory Visit: Payer: Self-pay

## 2019-01-17 DIAGNOSIS — E559 Vitamin D deficiency, unspecified: Secondary | ICD-10-CM

## 2019-01-17 LAB — VITAMIN D 25 HYDROXY (VIT D DEFICIENCY, FRACTURES): Vit D, 25-Hydroxy: 18 ng/mL — ABNORMAL LOW (ref 30–100)

## 2019-01-17 LAB — CBC WITH DIFFERENTIAL/PLATELET
Absolute Monocytes: 827 cells/uL (ref 200–950)
Basophils Absolute: 78 cells/uL (ref 0–200)
Basophils Relative: 0.9 %
Eosinophils Absolute: 531 cells/uL — ABNORMAL HIGH (ref 15–500)
Eosinophils Relative: 6.1 %
HCT: 38.8 % (ref 35.0–45.0)
Hemoglobin: 13.2 g/dL (ref 11.7–15.5)
Lymphs Abs: 1296 cells/uL (ref 850–3900)
MCH: 31.3 pg (ref 27.0–33.0)
MCHC: 34 g/dL (ref 32.0–36.0)
MCV: 91.9 fL (ref 80.0–100.0)
MPV: 12.2 fL (ref 7.5–12.5)
Monocytes Relative: 9.5 %
Neutro Abs: 5968 cells/uL (ref 1500–7800)
Neutrophils Relative %: 68.6 %
Platelets: 204 10*3/uL (ref 140–400)
RBC: 4.22 10*6/uL (ref 3.80–5.10)
RDW: 14 % (ref 11.0–15.0)
Total Lymphocyte: 14.9 %
WBC: 8.7 10*3/uL (ref 3.8–10.8)

## 2019-01-17 LAB — COMPLETE METABOLIC PANEL WITH GFR
AG Ratio: 1.4 (calc) (ref 1.0–2.5)
ALT: 17 U/L (ref 6–29)
AST: 15 U/L (ref 10–35)
Albumin: 4.4 g/dL (ref 3.6–5.1)
Alkaline phosphatase (APISO): 52 U/L (ref 37–153)
BUN: 13 mg/dL (ref 7–25)
CO2: 26 mmol/L (ref 20–32)
Calcium: 8.7 mg/dL (ref 8.6–10.4)
Chloride: 102 mmol/L (ref 98–110)
Creat: 0.99 mg/dL (ref 0.50–0.99)
GFR, Est African American: 70 mL/min/{1.73_m2} (ref >=60)
GFR, Est Non African American: 60 mL/min/{1.73_m2} (ref >=60)
Globulin: 3.1 g/dL (calc) (ref 1.9–3.7)
Glucose, Bld: 91 mg/dL (ref 65–99)
Potassium: 4.4 mmol/L (ref 3.5–5.3)
Sodium: 141 mmol/L (ref 135–146)
Total Bilirubin: 0.7 mg/dL (ref 0.2–1.2)
Total Protein: 7.5 g/dL (ref 6.1–8.1)

## 2019-01-17 MED ORDER — VITAMIN D (ERGOCALCIFEROL) 1.25 MG (50000 UNIT) PO CAPS: 50000.0000 [IU] | ORAL_CAPSULE | ORAL | 1 refills | Status: DC

## 2019-01-18 LAB — URINE CULTURE
MICRO NUMBER:: 483101
SPECIMEN QUALITY:: ADEQUATE

## 2019-01-25 ENCOUNTER — Telehealth: Payer: Self-pay | Admitting: *Deleted

## 2019-01-25 NOTE — Telephone Encounter (Signed)
Covid-19 travel screening questions  Have you traveled in the last 14 days?no If yes where?  Do you now or have you had a fever in the last 14 days?no  Do you have any respiratory symptoms of shortness of breath or cough now or in the last 14 days?no  Do you have a medical history of Congestive Heart Failure?  Do you have a medical history of lung disease?  Do you have any family members or close contacts with diagnosed or suspected Covid-19?no  Pt made aware of care partner policy and will bring a mask with her if available. SM

## 2019-01-26 ENCOUNTER — Other Ambulatory Visit: Payer: Self-pay

## 2019-01-26 ENCOUNTER — Encounter: Payer: Self-pay | Admitting: Gastroenterology

## 2019-01-26 ENCOUNTER — Ambulatory Visit (AMBULATORY_SURGERY_CENTER): Payer: BLUE CROSS/BLUE SHIELD | Admitting: Gastroenterology

## 2019-01-26 VITALS — BP 147/72 | HR 78 | Temp 98.5°F | Resp 15 | Ht 67.0 in | Wt 237.0 lb

## 2019-01-26 DIAGNOSIS — D123 Benign neoplasm of transverse colon: Secondary | ICD-10-CM

## 2019-01-26 DIAGNOSIS — D120 Benign neoplasm of cecum: Secondary | ICD-10-CM

## 2019-01-26 DIAGNOSIS — Z86010 Personal history of colonic polyps: Secondary | ICD-10-CM

## 2019-01-26 HISTORY — PX: COLONOSCOPY: SHX174

## 2019-01-26 MED ORDER — SODIUM CHLORIDE 0.9 % IV SOLN: 500.0000 mL | Freq: Once | INTRAVENOUS | Status: DC

## 2019-01-26 NOTE — Op Note (Signed)
[facility] Patient Name: Tara [name] Procedure Date: 01/26/2019 11:56 AM MRN: [id number] Endoscopist: Thornton Park MD, MD Age: 63 Referring MD:  Date of Birth: 13-Feb-1956 Gender: Female Account #: [id number] Procedure:                Colonoscopy Indications:              High risk colon cancer surveillance: Personal                            history of adenoma with villous component. 35 mm                            tubullovillous adenoma removed from the hepatic                            flexure in a piecemeal fashion 06/20/18. 36mm                            tubular adenoma removed from the same location in a                            piecemeal fashion 10/14/18. No ongoing GI symptoms. Medicines:                See the Anesthesia note for documentation of the                            administered medications Procedure:                Pre-Anesthesia Assessment:                           - Prior to the procedure, a History and Physical                            was performed, and patient medications and                            allergies were reviewed. The patient's tolerance of                            previous anesthesia was also reviewed. The risks                            and benefits of the procedure and the sedation                            options and risks were discussed with the patient.                            All questions were answered, and informed consent                            was obtained. Prior Anticoagulants: The patient has  taken no previous anticoagulant or antiplatelet                            agents. ASA Grade Assessment: III - A patient with                            severe systemic disease. After reviewing the risks                            and benefits, the patient was deemed in                            satisfactory condition to undergo the procedure.                           After obtaining  informed consent, the colonoscope                            was passed under direct vision. Throughout the                            procedure, the patient's blood pressure, pulse, and                            oxygen saturations were monitored continuously. The                            Colonoscope was introduced through the anus and                            advanced to the the terminal ileum, with                            identification of the appendiceal orifice and IC                            valve. The colonoscopy was performed with moderate                            difficulty due to ineffective sedation. Successful                            completion of the procedure was aided by having                            anesthesiology staff obtain IV access. The patient                            tolerated the procedure fairly well. The quality of                            the bowel preparation was good. The terminal ileum,  ileocecal valve, appendiceal orifice, and rectum                            were photographed. Scope In: 12:59:00 PM Scope Out: 1:32:57 PM Scope Withdrawal Time: 0 hours 27 minutes 3 seconds  Total Procedure Duration: 0 hours 33 minutes 57 seconds  Findings:                 The perianal and digital rectal examinations were                            normal.                           4-5 mm of residual polyp was found at the hepatic                            flexure in the area of the tattoo and prior                            polypectomy location. The polyp was flat. Area was                            unsuccessfully injected with 4 mL saline for a lift                            polypectomy. The polyp was removed with a saline                            injection-lift technique using a cold snare with                            the residual polyp removed with cold forceps.                            Resection and retrieval  were complete. Estimated                            blood loss was minimal. No other polyp identified                            in this area.                           A localized area of mildly altered vascular mucosa                            was found in the cecum. Biopsies were taken with a                            cold forceps for histology. The mucosa was firm.                            Estimated blood loss was minimal.  A few small and large-mouthed diverticula were                            found in the sigmoid colon and descending colon.                           The exam was otherwise without abnormality on                            direct and retroflexion views. Complications:            No immediate complications. Estimated blood loss:                            Minimal. Impression:               - One 4 mm polyp at the hepatic flexure, removed                            using injection-lift and a cold snare. Resected and                            retrieved. Treatment not successful.                           - Altered vascular mucosa in the cecum of unclear                            clinical significant. Biopsied.                           - Diverticulosis in the sigmoid colon and in the                            descending colon.                           - The examination was otherwise normal on direct                            and retroflexion views. Recommendation:           - Patient has a contact number available for                            emergencies. The signs and symptoms of potential                            delayed complications were discussed with the                            patient. Return to normal activities tomorrow.                            Written discharge instructions were provided to the  patient.                           - Resume regular diet today. High fiber diet                             recommended.                           - Continue present medications.                           - Await pathology results.                           - Repeat colonoscopy date to be determined after                            pending pathology results are reviewed for                            surveillance based on pathology results. Thornton Park MD, MD 01/26/2019 1:45:41 PM This report has been signed electronically.

## 2019-01-26 NOTE — Progress Notes (Signed)
Called to room to assist during endoscopic procedure.  Patient ID and intended procedure confirmed with present staff. Received instructions for my participation in the procedure from the performing physician.  

## 2019-01-26 NOTE — Progress Notes (Signed)
IV not working, does drip but pt not getting effects of sedation. PIV d/c'd pt says sight hurts but is not hard. 24g placed in right thumb.tb

## 2019-01-26 NOTE — Progress Notes (Signed)
Pt's states no medical or surgical changes since previsit or office visit. 

## 2019-01-26 NOTE — Progress Notes (Signed)
Warm compress applied to Right upper arm where IV had infiltrated.  Pt advised to watch for increased swelling and pain and to notify us if needed.  She agreed.

## 2019-01-26 NOTE — Patient Instructions (Signed)
Handouts given for polyps, diverticulosis and high fiber diet.  YOU HAD AN ENDOSCOPIC PROCEDURE TODAY AT [facility]:   Refer to the procedure report that was given to you for any specific questions about what was found during the examination.  If the procedure report does not answer your questions, please call your gastroenterologist to clarify.  If you requested that your care partner not be given the details of your procedure findings, then the procedure report has been included in a sealed envelope for you to review at your convenience later.  YOU SHOULD EXPECT: Some feelings of bloating in the abdomen. Passage of more gas than usual.  Walking can help get rid of the air that was put into your GI tract during the procedure and reduce the bloating. If you had a lower endoscopy (such as a colonoscopy or flexible sigmoidoscopy) you may notice spotting of blood in your stool or on the toilet paper. If you underwent a bowel prep for your procedure, you may not have a normal bowel movement for a few days.  Please Note:  You might notice some irritation and congestion in your nose or some drainage.  This is from the oxygen used during your procedure.  There is no need for concern and it should clear up in a day or so.  SYMPTOMS TO REPORT IMMEDIATELY:   Following lower endoscopy (colonoscopy or flexible sigmoidoscopy):  Excessive amounts of blood in the stool  Significant tenderness or worsening of abdominal pains  Swelling of the abdomen that is new, acute  Fever of 100F or higher  For urgent or emergent issues, a gastroenterologist can be reached at any hour by calling [phone].   DIET:  We do recommend a small meal at first, but then you may proceed to your regular diet.  Drink plenty of fluids but you should avoid alcoholic beverages for 24 hours.  ACTIVITY:  You should plan to take it easy for the rest of today and you should NOT DRIVE or use heavy machinery until  tomorrow (because of the sedation medicines used during the test).    FOLLOW UP: Our staff will call the number listed on your records 48-72 hours following your procedure to check on you and address any questions or concerns that you may have regarding the information given to you following your procedure. If we do not reach you, we will leave a message.  We will attempt to reach you two times.  During this call, we will ask if you have developed any symptoms of COVID 19. If you develop any symptoms (ie: fever, flu-like symptoms, shortness of breath, cough etc.) before then, please call [phone].  If you test positive for Covid 19 in the 2 weeks post procedure, please call and report this information to [name].    If any biopsies were taken you will be contacted by phone or by letter within the next 1-3 weeks.  Please call us at [phone] if you have not heard about the biopsies in 3 weeks.    SIGNATURES/CONFIDENTIALITY: You and/or your care partner have signed paperwork which will be entered into your electronic medical record.  These signatures attest to the fact that that the information above on your After Visit Summary has been reviewed and is understood.  Full responsibility of the confidentiality of this discharge information lies with you and/or your care-partner.

## 2019-01-26 NOTE — Progress Notes (Signed)
To PACU, VSS. Report to Rn.tb 

## 2019-01-30 ENCOUNTER — Telehealth: Payer: Self-pay | Admitting: *Deleted

## 2019-01-30 ENCOUNTER — Ambulatory Visit (INDEPENDENT_AMBULATORY_CARE_PROVIDER_SITE_OTHER): Payer: BLUE CROSS/BLUE SHIELD | Admitting: Family Medicine

## 2019-01-30 NOTE — Telephone Encounter (Signed)
  Follow up Call-  Call back number 01/26/2019 10/14/2018 06/20/2018  Post procedure Call Back phone  # [phone]  Permission to leave phone message Yes Yes Yes  Some recent data might be hidden     Patient questions:  Do you have a fever, pain , or abdominal swelling? No. Pain Score  0 *  Have you tolerated food without any problems? Yes.    Have you been able to return to your normal activities? Yes.    Do you have any questions about your discharge instructions: Diet   No. Medications  No. Follow up visit  No.  Do you have questions or concerns about your Care? No.  Actions: * If pain score is 4 or above: No action needed, pain <4.

## 2019-01-31 ENCOUNTER — Ambulatory Visit (INDEPENDENT_AMBULATORY_CARE_PROVIDER_SITE_OTHER): Payer: BC Managed Care – PPO | Admitting: Bariatrics

## 2019-01-31 ENCOUNTER — Other Ambulatory Visit: Payer: Self-pay

## 2019-01-31 ENCOUNTER — Encounter (INDEPENDENT_AMBULATORY_CARE_PROVIDER_SITE_OTHER): Payer: Self-pay | Admitting: Bariatrics

## 2019-01-31 DIAGNOSIS — Z6837 Body mass index (BMI) 37.0-37.9, adult: Secondary | ICD-10-CM

## 2019-01-31 DIAGNOSIS — F3289 Other specified depressive episodes: Secondary | ICD-10-CM

## 2019-01-31 DIAGNOSIS — E559 Vitamin D deficiency, unspecified: Secondary | ICD-10-CM | POA: Diagnosis not present

## 2019-01-31 DIAGNOSIS — R7303 Prediabetes: Secondary | ICD-10-CM | POA: Diagnosis not present

## 2019-01-31 MED ORDER — BUPROPION HCL ER (SR) 200 MG PO TB12: 200.0000 mg | ORAL_TABLET | Freq: Every day | ORAL | 0 refills | Status: DC

## 2019-02-01 NOTE — Progress Notes (Signed)
Office: [phone]  /  Fax: [phone] TeleHealth Visit:  Tara [name] has verbally consented to this TeleHealth visit today. The patient is located at home, the provider is located at the [employer] [facility] office. The participants in this visit include the listed provider and patient and any and all parties involved. The visit was conducted today via FaceTime.  HPI:   Chief Complaint: OBESITY Tara [name] is here to discuss her progress with her obesity treatment plan. She is on the Category 1 plan and is following her eating plan approximately 60 to 70 % of the time. She states she is exercising 0 minutes 0 times per week. Tara [name] states that she has lost 2 pounds (weight 237 lbs). She normally sees Dr. Adair Patter. Tara [name] has not been devoted to the program. She is doing some boredom eating. We were unable to weigh the patient today for this TeleHealth visit. She feels as if she has lost weight since her last visit. She has lost 6 lbs since starting treatment with [name].  Pre-Diabetes Tara [name] has a diagnosis of prediabetes based on her elevated Hgb A1c and was informed this puts her at greater risk of developing diabetes. Her last A1c was at 6.2 and last insulin level was at 31.9 Tara [name] is taking glucophage currently and she continues to work on diet and exercise to decrease risk of diabetes. She denies nausea or hypoglycemia.  Vitamin D deficiency Tara [name] has a diagnosis of vitamin D deficiency. Her last vitamin D level was at 18. She is taking high dose vit D and denies nausea, vomiting or muscle weakness.  Depression with emotional eating behaviors Tara [name] struggles with emotional eating and using food for comfort to the extent that it is negatively impacting her health. She often snacks when she is not hungry. Tara [name] sometimes feels she is out of control and then feels guilty that she made poor food choices. Tara [name] admits to boredom eating. She has been working on behavior modification techniques to help  reduce her emotional eating and has been somewhat successful. She shows no sign of suicidal or homicidal ideations.  Depression screen [facility] 2/9 01/16/2019 11/10/2018 10/25/2018 10/10/2018 07/24/2018  Decreased Interest 0 1 1 3  -  Down, Depressed, Hopeless 0 0 0 1 1  PHQ - 2 Score 0 1 1 4 1   Altered sleeping - 3 2 1  0  Tired, decreased energy - 2 2 2 1   Change in appetite - 1 2 3 1   Feeling bad or failure about yourself  - 0 2 3 1   Trouble concentrating - 2 2 3 1   Moving slowly or fidgety/restless - 0 0 0 1  Suicidal thoughts - 0 0 1 0  PHQ-9 Score - 9 11 17 6   Difficult doing work/chores - - - Not difficult at all Somewhat difficult  Some recent data might be hidden     ASSESSMENT AND PLAN:  Prediabetes  Vitamin D deficiency  Other depression - with emotional eating - Plan: buPROPion (WELLBUTRIN SR) 200 MG 12 hr tablet  Class 2 severe obesity with serious comorbidity and body mass index (BMI) of 37.0 to 37.9 in adult, unspecified obesity type ([place])  PLAN:  Pre-Diabetes Tara [name] will continue to work on weight loss, exercise, and decreasing simple carbohydrates in her diet to help decrease the risk of diabetes. We dicussed metformin including benefits and risks. She was informed that eating too many simple carbohydrates or too many calories at one sitting increases the likelihood of GI side  effects. Tara [name] will continue her medications and follow up with [name] as directed to monitor her progress.  Vitamin D Deficiency Tara [name] was informed that low vitamin D levels contributes to fatigue and are associated with obesity, breast, and colon cancer. She will continue to take prescription Vit D @50 ,000 IU every week and will follow up for routine testing of vitamin D, at least 2-3 times per year. She was informed of the risk of over-replacement of vitamin D and agrees to not increase her dose unless she discusses this with [name] first.  Depression with Emotional Eating Behaviors We discussed behavior  modification techniques today to help Tara [name] deal with her emotional eating and depression. She has agreed to continue Wellbutrin SR and increase to 200 mg daily #30 with no refills and follow up as directed.  Obesity Tara [name] is currently in the action stage of change. As such, her goal is to continue with weight loss efforts She has agreed to follow the Category 1 plan Tara [name] has been instructed to work up to a goal of 150 minutes of combined cardio and strengthening exercise per week for weight loss and overall health benefits. We discussed the following Behavioral Modification Strategies today: increase H2O intake, no skipping meals, keeping healthy foods in the home, increasing lean protein intake, decreasing simple carbohydrates, increasing vegetables, decrease eating out and work on meal planning and easy cooking plans Tara [name] will weigh herself at home and record.  Tara [name] has agreed to follow up with our clinic in 2 weeks. She was informed of the importance of frequent follow up visits to maximize her success with intensive lifestyle modifications for her multiple health conditions.  ALLERGIES: Allergies  Allergen Reactions  . Aspirin Other (See Comments)    Pt has a hx of TTP.   . Nsaids Other (See Comments)    Pt has a hx of TTP.     MEDICATIONS: Current Outpatient Medications on File Prior to Visit  Medication Sig Dispense Refill  . acetaminophen (TYLENOL) 500 MG tablet Take 500 mg by mouth every 6 (six) hours as needed for mild pain, moderate pain or headache.     . ADVAIR DISKUS 250-50 MCG/DOSE AEPB INHALE ONE PUFF BY MOUTH TWICE A DAY 60 each 1  . albuterol (PROVENTIL HFA;VENTOLIN HFA) 108 (90 BASE) MCG/ACT inhaler Inhale 2 puffs into the lungs every 6 (six) hours as needed for wheezing or shortness of breath.     . digoxin (LANOXIN) 0.25 MG tablet TAKE ONE TABLET BY MOUTH DAILY 60 tablet 2  . DULoxetine (CYMBALTA) 60 MG capsule Take 60 mg by mouth daily.    . fluticasone (FLONASE) 50  MCG/ACT nasal spray Place 2 sprays into both nostrils daily as needed for rhinitis.    Marland Kitchen gabapentin (NEURONTIN) 300 MG capsule TAKE ONE CAPSULE BY MOUTH TWICE A DAY 60 capsule 4  . levothyroxine (SYNTHROID, LEVOTHROID) 88 MCG tablet TAKE ONE TABLET BY MOUTH DAILY 90 tablet 0  . magnesium oxide (MAG-OX) 400 MG tablet Take 400 mg by mouth daily.    . metFORMIN (GLUCOPHAGE) 500 MG tablet TAKE ONE TABLET BY MOUTH TWO TIMES A DAY WITH A MEAL 180 tablet 3  . montelukast (SINGULAIR) 10 MG tablet TAKE ONE TABLET (10 MG) BY MOUTH AT BEDTIME. 90 tablet 2  . Multiple Vitamin (MULTIVITAMIN WITH MINERALS) TABS tablet Take 1 tablet by mouth daily.    . potassium chloride SA (K-DUR,KLOR-CON) 20 MEQ tablet TAKE ONE TABLET BY MOUTH DAILY 90 tablet 2  . torsemide (  DEMADEX) 20 MG tablet TAKE ONE TABLET BY MOUTH EVERY DAY. 90 tablet 1  . Vitamin D, Ergocalciferol, (DRISDOL) 1.25 MG (50000 UT) CAPS capsule Take 1 capsule (50,000 Units total) by mouth every 7 (seven) days. 36 capsule 1   Current Facility-Administered Medications on File Prior to Visit  Medication Dose Route Frequency Provider Last Rate Last Dose  . 0.9 %  sodium chloride infusion  500 mL Intravenous Once Thornton Park, MD        PAST MEDICAL HISTORY: Past Medical History:  Diagnosis Date  . Allergy   . Anxiety   . Arthritis   . Asthma   . Back pain   . Blood transfusion   . Breast cancer ([place]) 2012   left breast  . Chronic edema   . Cold hands and feet   . Complication of anesthesia    difficulty waking up/dizzy/lightheaded  . Cough   . Diabetes mellitus without complication ([place])    on Metformin-   . Dysrhythmia    irregular heartbeat, takes digoxin  . Environmental allergies   . Eye pain   . Fatigue   . GERD (gastroesophageal reflux disease)    occasionally   . H/O varicella   . H/O varicose veins   . Hepatic cirrhosis ([place])   . Hepatitis C   . History of chemotherapy 2013   left breast cancer  . History of measles,  mumps, or rubella   . Hoarseness   . Hx of radiation therapy 12/15/11 - 01/29/12   left breast  . Hypothyroidism   . Irregular heart beat    under control  . Joint pain   . Leg cramps   . Neuromuscular disorder ([place])    left foot nerve damage- neuropathy   . Osteopenia   . Palpitations   . Pre-diabetes   . Rheumatoid arthritis ([place])   . Ringing in ear   . Shortness of breath   . Swallowing difficulty   . Swelling of both lower extremities   . Thrombocytopenia, primary ([place])   . TTP (thrombotic thrombocytopenic purpura) ([place])     PAST SURGICAL HISTORY: Past Surgical History:  Procedure Laterality Date  . BREAST LUMPECTOMY  08/03/11   left lumpectomy and slnbx,T1cN0,triple pos  . COLONOSCOPY    . elbow pins Left   . FOOT SURGERY Left    multiple  . POLYPECTOMY    . PORTACATH PLACEMENT  08/03/2011   Procedure: INSERTION PORT-A-CATH;  Surgeon: Merrie Roof, MD;  Location: [facility];  Service: General;  Laterality: Right;  . portacath removal    . TOTAL HIP ARTHROPLASTY Bilateral   . UPPER GASTROINTESTINAL ENDOSCOPY      SOCIAL HISTORY: Social History   Tobacco Use  . Smoking status: Never Smoker  . Smokeless tobacco: Never Used  Substance Use Topics  . Alcohol use: Yes    Alcohol/week: 1.0 standard drinks    Types: 1 Glasses of wine per week    Comment: occ  . Drug use: No    FAMILY HISTORY: Family History  Problem Relation Age of Onset  . Pneumonia Mother   . COPD Mother   . Colon polyps Mother   . Heart disease Mother   . Thyroid disease Mother   . Obesity Mother   . COPD Father   . Obesity Father   . Brain cancer Maternal Grandfather   . Alcohol abuse Maternal Grandfather   . Hyperlipidemia Neg Hx   . Hypertension Neg Hx   .  Colon cancer Neg Hx   . Rectal cancer Neg Hx   . Stomach cancer Neg Hx   . Esophageal cancer Neg Hx     ROS: Review of Systems  Constitutional: Positive for weight loss.  Gastrointestinal: Negative for nausea and vomiting.   Musculoskeletal:       Negative for muscle weakness  Endo/Heme/Allergies:       Negative for hypoglycemia  Psychiatric/Behavioral: Positive for depression. Negative for suicidal ideas.    PHYSICAL EXAM: Pt in no acute distress  RECENT LABS AND TESTS: BMET    Component Value Date/Time   NA 141 01/16/2019 1529   NA 137 10/10/2018 1347   NA 136 04/20/2017 1422   K 4.4 01/16/2019 1529   K 4.0 04/20/2017 1422   CL 102 01/16/2019 1529   CL 108 (H) 12/05/2012 0953   CO2 26 01/16/2019 1529   CO2 28 04/20/2017 1422   GLUCOSE 91 01/16/2019 1529   GLUCOSE 136 04/20/2017 1422   GLUCOSE 112 (H) 12/05/2012 0953   BUN 13 01/16/2019 1529   BUN 13 10/10/2018 1347   BUN 19.8 04/20/2017 1422   CREATININE 0.99 01/16/2019 1529   CREATININE 1.0 04/20/2017 1422   CALCIUM 8.7 01/16/2019 1529   CALCIUM 9.0 04/20/2017 1422   GFRNONAA 60 01/16/2019 1529   GFRAA 70 01/16/2019 1529   Lab Results  Component Value Date   HGBA1C 5.8 (H) 01/09/2019   HGBA1C 6.2 (H) 10/10/2018   HGBA1C 6.3 (H) 07/08/2018   HGBA1C 6.1 (H) 01/25/2018   HGBA1C 6.3 (H) 10/25/2017   Lab Results  Component Value Date   INSULIN 31.9 (H) 10/10/2018   CBC    Component Value Date/Time   WBC 8.7 01/16/2019 1529   RBC 4.22 01/16/2019 1529   HGB 13.2 01/16/2019 1529   HGB 12.7 04/27/2018 1234   HGB 12.9 04/20/2017 1422   HCT 38.8 01/16/2019 1529   HCT 38.7 04/20/2017 1422   PLT 204 01/16/2019 1529   PLT 169 04/27/2018 1234   PLT 168 04/20/2017 1422   MCV 91.9 01/16/2019 1529   MCV 96.0 04/20/2017 1422   MCH 31.3 01/16/2019 1529   MCHC 34.0 01/16/2019 1529   RDW 14.0 01/16/2019 1529   RDW 14.0 04/20/2017 1422   LYMPHSABS 1,296 01/16/2019 1529   LYMPHSABS 1.0 04/20/2017 1422   MONOABS 0.7 06/14/2018 1144   MONOABS 0.6 04/20/2017 1422   EOSABS 531 (H) 01/16/2019 1529   EOSABS 0.3 04/20/2017 1422   BASOSABS 78 01/16/2019 1529   BASOSABS 0.0 04/20/2017 1422   Iron/TIBC/Ferritin/ %Sat No results found for:  IRON, TIBC, FERRITIN, IRONPCTSAT Lipid Panel     Component Value Date/Time   CHOL 236 (H) 01/09/2019 0924   TRIG 230 (H) 01/09/2019 0924   HDL 46 (L) 01/09/2019 0924   CHOLHDL 5.1 (H) 01/09/2019 0924   VLDL 29 06/29/2016 1103   LDLCALC 151 (H) 01/09/2019 0924   Hepatic Function Panel     Component Value Date/Time   PROT 7.5 01/16/2019 1529   PROT 7.5 10/10/2018 1347   PROT 7.4 04/20/2017 1422   ALBUMIN 4.4 10/10/2018 1347   ALBUMIN 3.7 04/20/2017 1422   AST 15 01/16/2019 1529   AST 32 04/20/2017 1422   ALT 17 01/16/2019 1529   ALT 32 (H) 04/21/2017 1120   ALT 35 04/20/2017 1422   ALKPHOS 61 10/10/2018 1347   ALKPHOS 81 04/20/2017 1422   BILITOT 0.7 01/16/2019 1529   BILITOT 0.5 10/10/2018 1347   BILITOT 0.78 04/20/2017  1422   BILIDIR 0.2 01/09/2019 0924   IBILI 0.7 01/09/2019 0924      Component Value Date/Time   TSH 3.15 01/09/2019 0924   TSH 2.21 07/08/2018 0925   TSH 1.53 10/21/2017 1250     Ref. Range 01/16/2019 15:29  Vitamin D, 25-Hydroxy Latest Ref Range: 30 - 100 ng/mL 18 (L)    I, Doreene Nest, am acting as Location manager for [employer]. Owens Shark, DO  I have reviewed the above documentation for accuracy and completeness, and I agree with the above. -Jearld Lesch, DO

## 2019-02-04 ENCOUNTER — Other Ambulatory Visit: Payer: Self-pay | Admitting: Internal Medicine

## 2019-02-14 ENCOUNTER — Ambulatory Visit (INDEPENDENT_AMBULATORY_CARE_PROVIDER_SITE_OTHER): Payer: BC Managed Care – PPO | Admitting: Bariatrics

## 2019-02-14 ENCOUNTER — Other Ambulatory Visit: Payer: Self-pay

## 2019-02-14 ENCOUNTER — Encounter (INDEPENDENT_AMBULATORY_CARE_PROVIDER_SITE_OTHER): Payer: Self-pay | Admitting: Bariatrics

## 2019-02-14 DIAGNOSIS — Z6837 Body mass index (BMI) 37.0-37.9, adult: Secondary | ICD-10-CM | POA: Diagnosis not present

## 2019-02-14 DIAGNOSIS — F3289 Other specified depressive episodes: Secondary | ICD-10-CM

## 2019-02-14 DIAGNOSIS — R7303 Prediabetes: Secondary | ICD-10-CM | POA: Diagnosis not present

## 2019-02-14 MED ORDER — BUPROPION HCL ER (SR) 200 MG PO TB12: 200.0000 mg | ORAL_TABLET | Freq: Every day | ORAL | 0 refills | Status: DC

## 2019-02-15 ENCOUNTER — Ambulatory Visit: Payer: BC Managed Care – PPO | Admitting: Podiatry

## 2019-02-15 ENCOUNTER — Other Ambulatory Visit: Payer: Self-pay

## 2019-02-15 ENCOUNTER — Other Ambulatory Visit: Payer: Self-pay | Admitting: Internal Medicine

## 2019-02-15 ENCOUNTER — Ambulatory Visit (INDEPENDENT_AMBULATORY_CARE_PROVIDER_SITE_OTHER): Payer: BC Managed Care – PPO

## 2019-02-15 ENCOUNTER — Other Ambulatory Visit: Payer: Self-pay | Admitting: Podiatry

## 2019-02-15 ENCOUNTER — Encounter: Payer: Self-pay | Admitting: Podiatry

## 2019-02-15 VITALS — Temp 97.5°F

## 2019-02-15 DIAGNOSIS — M79671 Pain in right foot: Secondary | ICD-10-CM

## 2019-02-15 DIAGNOSIS — M79675 Pain in left toe(s): Secondary | ICD-10-CM

## 2019-02-15 DIAGNOSIS — M779 Enthesopathy, unspecified: Secondary | ICD-10-CM

## 2019-02-15 DIAGNOSIS — M79674 Pain in right toe(s): Secondary | ICD-10-CM

## 2019-02-15 DIAGNOSIS — M79672 Pain in left foot: Secondary | ICD-10-CM

## 2019-02-15 DIAGNOSIS — B351 Tinea unguium: Secondary | ICD-10-CM | POA: Diagnosis not present

## 2019-02-15 DIAGNOSIS — M205X2 Other deformities of toe(s) (acquired), left foot: Secondary | ICD-10-CM | POA: Diagnosis not present

## 2019-02-15 NOTE — Patient Instructions (Addendum)
Please try to be motivated to diet exercise and lose weight and follow-up with Dr. Adair Patter.  Return in 1 year or as needed.  Continue current medications.  Recommend annual flu vaccine in the early fall.

## 2019-02-15 NOTE — Progress Notes (Signed)
Subjective:   Patient ID: Tara [name], female   DOB: 63 y.o.   MRN: [id number]   HPI Patient states her right foot was very sore and it seems to have improved some and now her left big toe joint has become sore and she is also concerned about nail disease   ROS      Objective:  Physical Exam  Neurovascular status intact with inflammation pain around the first MPJ left with fluid buildup and on the right foot I did note slight pain in the outside of the foot     Assessment:  Capsulitis of the first MPJ left with possible systemic causes along with inflammation pain of the lateral side right foot that is mild but was more severe earlier     Plan:  H&P conditions reviewed at today for the left I did do sterile injection of the first MPJ 3 mg Kenalog 5 mg Xylocaine and for the right I advised on ice therapy support and if it were to start getting sore will be seen back immediately  X-ray indicates that there is previous surgery on the left with moderate bunion deformity and on the right I did not note fracture fifth metatarsal

## 2019-02-15 NOTE — Progress Notes (Signed)
Office: [phone]  /  Fax: [phone] TeleHealth Visit:  Tara [name] has verbally consented to this TeleHealth visit today. The patient is located at home, the provider is located at the [facility] office. The participants in this visit include the listed provider and patient and any and all parties involved. The visit was conducted today via FaceTime.  HPI:   Chief Complaint: OBESITY Tara [name] is here to discuss her progress with her obesity treatment plan. She is on the Category 1 plan and is following her eating plan approximately 15 % of the time. She states she is exercising 0 minutes 0 times per week. Tara [name] states that her weight is the same (weight 240 lbs). She states that she started out okay. We were unable to weigh the patient today for this TeleHealth visit. She feels as if she has maintained weight since her last visit. She has lost 3 lbs since starting treatment with [name].  Pre-Diabetes Tara [name] has a diagnosis of prediabetes based on her elevated Hgb A1c and was informed this puts her at greater risk of developing diabetes. She is taking glucophage currently and continues to work on diet and exercise to decrease risk of diabetes. She denies nausea or hypoglycemia.  Depression with emotional eating behaviors Tara [name] is struggling with emotional eating and using food for comfort to the extent that it is negatively impacting her health. She often snacks when she is not hungry. Tara [name] sometimes feels she is out of control and then feels guilty that she made poor food choices. She has been working on behavior modification techniques to help reduce her emotional eating and has been somewhat successful. She shows no sign of suicidal or homicidal ideations.  ASSESSMENT AND PLAN:  Prediabetes  Other depression - with emotional eating - Plan: buPROPion (WELLBUTRIN SR) 200 MG 12 hr tablet  Class 2 severe obesity with serious comorbidity and body mass index (BMI) of 37.0 to 37.9  in adult, unspecified obesity type Tara [name])  PLAN:  Pre-Diabetes Tara [name] will continue to work on weight loss, exercise, and decreasing simple carbohydrates in her diet to help decrease the risk of diabetes. She was informed that eating too many simple carbohydrates or too many calories at one sitting increases the likelihood of GI side effects. Tara [name] will continue her medications and follow up with [name] as directed to monitor her progress.  Depression with Emotional Eating Behaviors We discussed behavior modification techniques today to help Tara [name] deal with her emotional eating and depression. She has agreed to continue Wellbutrin SR 200 mg daily #90 with no refills and follow up as directed.  Obesity Tara [name] is currently in the action stage of change. As such, her goal is to continue with weight loss efforts She has agreed to follow the Category 1 plan Tara [name] will ride the stationary bike for weight loss and overall health benefits. We discussed the following Behavioral Modification Strategies today: increase H2O intake, no skipping meals, keeping healthy foods in the home, increasing lean protein intake, decreasing simple carbohydrates, increasing vegetables, decrease eating out and work on meal planning and intentional eating Tara [name] will work toward a small goal of 235 pounds.  Tara [name] has agreed to follow up with our clinic in 2 weeks. She was informed of the importance of frequent follow up visits to maximize her success with intensive lifestyle modifications for her multiple health conditions.  ALLERGIES: Allergies  Allergen Reactions  . Aspirin Other (See Comments)    Pt has a hx of TTP.   Marland Kitchen  Nsaids Other (See Comments)    Pt has a hx of TTP.     MEDICATIONS: Current Outpatient Medications on File Prior to Visit  Medication Sig Dispense Refill  . acetaminophen (TYLENOL) 500 MG tablet Take 500 mg by mouth every 6 (six) hours as needed for mild pain, moderate pain or headache.     . ADVAIR DISKUS  250-50 MCG/DOSE AEPB INHALE ONE PUFF BY MOUTH TWICE A DAY 60 each 1  . albuterol (PROVENTIL HFA;VENTOLIN HFA) 108 (90 BASE) MCG/ACT inhaler Inhale 2 puffs into the lungs every 6 (six) hours as needed for wheezing or shortness of breath.     . digoxin (LANOXIN) 0.25 MG tablet TAKE ONE TABLET BY MOUTH DAILY 60 tablet 2  . DULoxetine (CYMBALTA) 60 MG capsule Take 60 mg by mouth daily.    . fluticasone (FLONASE) 50 MCG/ACT nasal spray Place 2 sprays into both nostrils daily as needed for rhinitis.    Marland Kitchen gabapentin (NEURONTIN) 300 MG capsule TAKE ONE CAPSULE BY MOUTH TWICE A DAY 60 capsule 4  . magnesium oxide (MAG-OX) 400 MG tablet Take 400 mg by mouth daily.    . metFORMIN (GLUCOPHAGE) 500 MG tablet TAKE ONE TABLET BY MOUTH TWO TIMES A DAY WITH A MEAL 180 tablet 3  . montelukast (SINGULAIR) 10 MG tablet TAKE ONE TABLET (10 MG) BY MOUTH AT BEDTIME. 90 tablet 1  . Multiple Vitamin (MULTIVITAMIN WITH MINERALS) TABS tablet Take 1 tablet by mouth daily.    . potassium chloride SA (K-DUR,KLOR-CON) 20 MEQ tablet TAKE ONE TABLET BY MOUTH DAILY 90 tablet 2  . torsemide (DEMADEX) 20 MG tablet TAKE ONE TABLET BY MOUTH EVERY DAY. 90 tablet 1  . Vitamin D, Ergocalciferol, (DRISDOL) 1.25 MG (50000 UT) CAPS capsule Take 1 capsule (50,000 Units total) by mouth every 7 (seven) days. 36 capsule 1   Current Facility-Administered Medications on File Prior to Visit  Medication Dose Route Frequency Provider Last Rate Last Dose  . 0.9 %  sodium chloride infusion  500 mL Intravenous Once [place], MD        PAST MEDICAL HISTORY: Past Medical History:  Diagnosis Date  . Allergy   . Anxiety   . Arthritis   . Asthma   . Back pain   . Blood transfusion   . Breast cancer ([place]) 2012   left breast  . Chronic edema   . Cold hands and feet   . Complication of anesthesia    difficulty waking up/dizzy/lightheaded  . Cough   . Diabetes mellitus without complication ([place])    on Metformin-   . Dysrhythmia     irregular heartbeat, takes digoxin  . Environmental allergies   . Eye pain   . Fatigue   . GERD (gastroesophageal reflux disease)    occasionally   . H/O varicella   . H/O varicose veins   . Hepatic cirrhosis ([place])   . Hepatitis C   . History of chemotherapy 2013   left breast cancer  . History of measles, mumps, or rubella   . Hoarseness   . Hx of radiation therapy 12/15/11 - 01/29/12   left breast  . Hypothyroidism   . Irregular heart beat    under control  . Joint pain   . Leg cramps   . Neuromuscular disorder ([place])    left foot nerve damage- neuropathy   . Osteopenia   . Palpitations   . Pre-diabetes   . Rheumatoid arthritis ([place])   . Ringing in ear   .  Shortness of breath   . Swallowing difficulty   . Swelling of both lower extremities   . Thrombocytopenia, primary ([place])   . TTP (thrombotic thrombocytopenic purpura) ([place])     PAST SURGICAL HISTORY: Past Surgical History:  Procedure Laterality Date  . BREAST LUMPECTOMY  08/03/11   left lumpectomy and slnbx,T1cN0,triple pos  . COLONOSCOPY    . elbow pins Left   . FOOT SURGERY Left    multiple  . POLYPECTOMY    . PORTACATH PLACEMENT  08/03/2011   Procedure: INSERTION PORT-A-CATH;  Surgeon: Merrie Roof, MD;  Location: [facility];  Service: General;  Laterality: Right;  . portacath removal    . TOTAL HIP ARTHROPLASTY Bilateral   . UPPER GASTROINTESTINAL ENDOSCOPY      SOCIAL HISTORY: Social History   Tobacco Use  . Smoking status: Never Smoker  . Smokeless tobacco: Never Used  Substance Use Topics  . Alcohol use: Yes    Alcohol/week: 1.0 standard drinks    Types: 1 Glasses of wine per week    Comment: occ  . Drug use: No    FAMILY HISTORY: Family History  Problem Relation Age of Onset  . Pneumonia Mother   . COPD Mother   . Colon polyps Mother   . Heart disease Mother   . Thyroid disease Mother   . Obesity Mother   . COPD Father   . Obesity Father   . Brain cancer Maternal Grandfather   .  Alcohol abuse Maternal Grandfather   . Hyperlipidemia Neg Hx   . Hypertension Neg Hx   . Colon cancer Neg Hx   . Rectal cancer Neg Hx   . Stomach cancer Neg Hx   . Esophageal cancer Neg Hx     ROS: Review of Systems  Constitutional: Negative for weight loss.  Gastrointestinal: Negative for nausea.  Endo/Heme/Allergies:       Negative for hypoglycemia  Psychiatric/Behavioral: Positive for depression. Negative for suicidal ideas.    PHYSICAL EXAM: Pt in no acute distress  RECENT LABS AND TESTS: BMET    Component Value Date/Time   NA 141 01/16/2019 1529   NA 137 10/10/2018 1347   NA 136 04/20/2017 1422   K 4.4 01/16/2019 1529   K 4.0 04/20/2017 1422   CL 102 01/16/2019 1529   CL 108 (H) 12/05/2012 0953   CO2 26 01/16/2019 1529   CO2 28 04/20/2017 1422   GLUCOSE 91 01/16/2019 1529   GLUCOSE 136 04/20/2017 1422   GLUCOSE 112 (H) 12/05/2012 0953   BUN 13 01/16/2019 1529   BUN 13 10/10/2018 1347   BUN 19.8 04/20/2017 1422   CREATININE 0.99 01/16/2019 1529   CREATININE 1.0 04/20/2017 1422   CALCIUM 8.7 01/16/2019 1529   CALCIUM 9.0 04/20/2017 1422   GFRNONAA 60 01/16/2019 1529   GFRAA 70 01/16/2019 1529   Lab Results  Component Value Date   HGBA1C 5.8 (H) 01/09/2019   HGBA1C 6.2 (H) 10/10/2018   HGBA1C 6.3 (H) 07/08/2018   HGBA1C 6.1 (H) 01/25/2018   HGBA1C 6.3 (H) 10/25/2017   Lab Results  Component Value Date   INSULIN 31.9 (H) 10/10/2018   CBC    Component Value Date/Time   WBC 8.7 01/16/2019 1529   RBC 4.22 01/16/2019 1529   HGB 13.2 01/16/2019 1529   HGB 12.7 04/27/2018 1234   HGB 12.9 04/20/2017 1422   HCT 38.8 01/16/2019 1529   HCT 38.7 04/20/2017 1422   PLT 204 01/16/2019 1529   PLT  169 04/27/2018 1234   PLT 168 04/20/2017 1422   MCV 91.9 01/16/2019 1529   MCV 96.0 04/20/2017 1422   MCH 31.3 01/16/2019 1529   MCHC 34.0 01/16/2019 1529   RDW 14.0 01/16/2019 1529   RDW 14.0 04/20/2017 1422   LYMPHSABS 1,296 01/16/2019 1529   LYMPHSABS 1.0  04/20/2017 1422   MONOABS 0.7 06/14/2018 1144   MONOABS 0.6 04/20/2017 1422   EOSABS 531 (H) 01/16/2019 1529   EOSABS 0.3 04/20/2017 1422   BASOSABS 78 01/16/2019 1529   BASOSABS 0.0 04/20/2017 1422   Iron/TIBC/Ferritin/ %Sat No results found for: IRON, TIBC, FERRITIN, IRONPCTSAT Lipid Panel     Component Value Date/Time   CHOL 236 (H) 01/09/2019 0924   TRIG 230 (H) 01/09/2019 0924   HDL 46 (L) 01/09/2019 0924   CHOLHDL 5.1 (H) 01/09/2019 0924   VLDL 29 06/29/2016 1103   LDLCALC 151 (H) 01/09/2019 0924   Hepatic Function Panel     Component Value Date/Time   PROT 7.5 01/16/2019 1529   PROT 7.5 10/10/2018 1347   PROT 7.4 04/20/2017 1422   ALBUMIN 4.4 10/10/2018 1347   ALBUMIN 3.7 04/20/2017 1422   AST 15 01/16/2019 1529   AST 32 04/20/2017 1422   ALT 17 01/16/2019 1529   ALT 32 (H) 04/21/2017 1120   ALT 35 04/20/2017 1422   ALKPHOS 61 10/10/2018 1347   ALKPHOS 81 04/20/2017 1422   BILITOT 0.7 01/16/2019 1529   BILITOT 0.5 10/10/2018 1347   BILITOT 0.78 04/20/2017 1422   BILIDIR 0.2 01/09/2019 0924   IBILI 0.7 01/09/2019 0924      Component Value Date/Time   TSH 3.15 01/09/2019 0924   TSH 2.21 07/08/2018 0925   TSH 1.53 10/21/2017 1250     Ref. Range 01/16/2019 15:29  Vitamin D, 25-Hydroxy Latest Ref Range: 30 - 100 ng/mL 18 (L)    I, Doreene Nest, am acting as Location manager for [employer]. Owens Shark, DO  I have reviewed the above documentation for accuracy and completeness, and I agree with the above. -Jearld Lesch, DO

## 2019-02-21 ENCOUNTER — Encounter (INDEPENDENT_AMBULATORY_CARE_PROVIDER_SITE_OTHER): Payer: Self-pay | Admitting: Bariatrics

## 2019-02-28 ENCOUNTER — Encounter (INDEPENDENT_AMBULATORY_CARE_PROVIDER_SITE_OTHER): Payer: Self-pay | Admitting: Bariatrics

## 2019-02-28 ENCOUNTER — Telehealth (INDEPENDENT_AMBULATORY_CARE_PROVIDER_SITE_OTHER): Payer: BC Managed Care – PPO | Admitting: Bariatrics

## 2019-02-28 ENCOUNTER — Other Ambulatory Visit: Payer: Self-pay

## 2019-02-28 DIAGNOSIS — Z6835 Body mass index (BMI) 35.0-35.9, adult: Secondary | ICD-10-CM

## 2019-02-28 DIAGNOSIS — R7303 Prediabetes: Secondary | ICD-10-CM

## 2019-02-28 DIAGNOSIS — E559 Vitamin D deficiency, unspecified: Secondary | ICD-10-CM | POA: Diagnosis not present

## 2019-02-28 NOTE — Progress Notes (Signed)
Office: [phone]  /  Fax: [phone] TeleHealth Visit:  Tara [name] has verbally consented to this TeleHealth visit today. The patient is located at home, the provider is located at the [facility] office. The participants in this visit include the listed provider and patient. The visit was conducted today via FaceTime.  HPI:   Chief Complaint: OBESITY Tara [name] is here to discuss her progress with her obesity treatment plan. She is on the Category 1 plan and is following her eating plan approximately 10% of the time. She states she is stretching 10 minutes 2 times per week. Tara [name] states that she has lost 5 lbs and is doing well. She does report having had some mild asthma issues.  We were unable to weigh the patient today for this TeleHealth visit. She feels as if she has lost 5 lbs since her last in office visit. She has lost 4 lbs since starting treatment with [name].  Pre-Diabetes Tara [name] has a diagnosis of prediabetes based on her elevated Hgb A1c and was informed this puts her at greater risk of developing diabetes. She is taking metformin currently and continues to work on diet and exercise to decrease risk of diabetes. She denies nausea or hypoglycemia.  Vitamin D deficiency Tara [name] has a diagnosis of Vitamin D deficiency. She is currently taking Vit D and denies nausea, vomiting or muscle weakness.  Depression with emotional eating behaviors Tara [name] is struggling with emotional eating and using food for comfort to the extent that it is negatively impacting her health. She often snacks when she is not hungry. Tara [name] sometimes feels she is out of control and then feels guilty that she made poor food choices. She has been working on behavior modification techniques to help reduce her emotional eating and has been somewhat successful. Tara [name] is taking Wellbutrin and shows no sign of suicidal or homicidal ideations.  Depression screen [name] 2/9 01/16/2019 11/10/2018 10/25/2018 10/10/2018  07/24/2018  Decreased Interest 0 1 1 3  -  Down, Depressed, Hopeless 0 0 0 1 1  PHQ - 2 Score 0 1 1 4 1   Altered sleeping - 3 2 1  0  Tired, decreased energy - 2 2 2 1   Change in appetite - 1 2 3 1   Feeling bad or failure about yourself  - 0 2 3 1   Trouble concentrating - 2 2 3 1   Moving slowly or fidgety/restless - 0 0 0 1  Suicidal thoughts - 0 0 1 0  PHQ-9 Score - 9 11 17 6   Difficult doing work/chores - - - Not difficult at all Somewhat difficult  Some recent data might be hidden   ASSESSMENT AND PLAN:  Prediabetes  Vitamin D deficiency  Class 2 severe obesity with serious comorbidity and body mass index (BMI) of 35.0 to 35.9 in adult, unspecified obesity type (Tara [name])  PLAN:  Pre-Diabetes Tara [name] will continue to work on weight loss, exercise, and decreasing simple carbohydrates in her diet to help decrease the risk of diabetes. We dicussed metformin including benefits and risks. She was informed that eating too many simple carbohydrates or too many calories at one sitting increases the likelihood of GI side effects. Florrie will continue metformin and follow-up with [name] as directed to monitor her progress.  Vitamin D Deficiency Tara [name] was informed that low Vitamin D levels contributes to fatigue and are associated with obesity, breast, and colon cancer. She agrees to continue taking Vit D and will follow-up for routine testing of Vitamin D, at  least 2-3 times per year. She was informed of the risk of over-replacement of Vitamin D and agrees to not increase her dose unless she discusses this with [name] first. Tara [name] agrees to follow-up with our clinic in 2 weeks.  Depression with Emotional Eating Behaviors We discussed behavior modification techniques today to help Tara [name] deal with her emotional eating and depression. Tara [name] will continue Wellbutrin and follow-up as directed. She states she is keeping good snacks at home.  Obesity Tara [name] is currently in the action stage of change. As such, her goal  is to continue with weight loss efforts. She has agreed to follow the Category 1 plan. Tara [name] will increase her activity, work on meal planning, and will not skip breakfast. Tara [name] has been instructed to continue her stretches and increase her activity for weight loss and overall health benefits. We discussed the following Behavioral Modification Strategies today: increasing lean protein intake, decreasing simple carbohydrates, increasing vegetables, increase H20 intake, decrease eating out, no skipping meals, work on meal planning and easy cooking plans, keeping healthy foods in the home, and planning for success.  Tara [name] has agreed to follow-up with our clinic in 2 weeks. She was informed of the importance of frequent follow-up visits to maximize her success with intensive lifestyle modifications for her multiple health conditions.  ALLERGIES: Allergies  Allergen Reactions  . Aspirin Other (See Comments)    Pt has a hx of TTP.   . Nsaids Other (See Comments)    Pt has a hx of TTP.     MEDICATIONS: Current Outpatient Medications on File Prior to Visit  Medication Sig Dispense Refill  . acetaminophen (TYLENOL) 500 MG tablet Take 500 mg by mouth every 6 (six) hours as needed for mild pain, moderate pain or headache.     . ADVAIR DISKUS 250-50 MCG/DOSE AEPB INHALE ONE PUFF BY MOUTH TWICE A DAY 60 each 1  . albuterol (PROVENTIL HFA;VENTOLIN HFA) 108 (90 BASE) MCG/ACT inhaler Inhale 2 puffs into the lungs every 6 (six) hours as needed for wheezing or shortness of breath.     Marland Kitchen buPROPion (WELLBUTRIN SR) 200 MG 12 hr tablet Take 1 tablet (200 mg total) by mouth daily. 90 tablet 0  . digoxin (LANOXIN) 0.25 MG tablet TAKE ONE TABLET BY MOUTH DAILY 60 tablet 2  . DULoxetine (CYMBALTA) 60 MG capsule Take 60 mg by mouth daily.    . fluticasone (FLONASE) 50 MCG/ACT nasal spray Place 2 sprays into both nostrils daily as needed for rhinitis.    Marland Kitchen gabapentin (NEURONTIN) 300 MG capsule TAKE ONE CAPSULE BY MOUTH  TWICE A DAY 60 capsule 4  . levothyroxine (SYNTHROID) 88 MCG tablet TAKE ONE TABLET BY MOUTH DAILY 90 tablet 0  . magnesium oxide (MAG-OX) 400 MG tablet Take 400 mg by mouth daily.    . metFORMIN (GLUCOPHAGE) 500 MG tablet TAKE ONE TABLET BY MOUTH TWO TIMES A DAY WITH A MEAL 180 tablet 3  . montelukast (SINGULAIR) 10 MG tablet TAKE ONE TABLET (10 MG) BY MOUTH AT BEDTIME. 90 tablet 1  . Multiple Vitamin (MULTIVITAMIN WITH MINERALS) TABS tablet Take 1 tablet by mouth daily.    . potassium chloride SA (K-DUR,KLOR-CON) 20 MEQ tablet TAKE ONE TABLET BY MOUTH DAILY 90 tablet 2  . torsemide (DEMADEX) 20 MG tablet TAKE ONE TABLET BY MOUTH EVERY DAY. 90 tablet 1  . Vitamin D, Ergocalciferol, (DRISDOL) 1.25 MG (50000 UT) CAPS capsule Take 1 capsule (50,000 Units total) by mouth every 7 (seven) days. 36 capsule  1   Current Facility-Administered Medications on File Prior to Visit  Medication Dose Route Frequency Provider Last Rate Last Dose  . 0.9 %  sodium chloride infusion  500 mL Intravenous Once [place], MD        PAST MEDICAL HISTORY: Past Medical History:  Diagnosis Date  . Allergy   . Anxiety   . Arthritis   . Asthma   . Back pain   . Blood transfusion   . Breast cancer ([place]) 2012   left breast  . Chronic edema   . Cold hands and feet   . Complication of anesthesia    difficulty waking up/dizzy/lightheaded  . Cough   . Diabetes mellitus without complication ([place])    on Metformin-   . Dysrhythmia    irregular heartbeat, takes digoxin  . Environmental allergies   . Eye pain   . Fatigue   . GERD (gastroesophageal reflux disease)    occasionally   . H/O varicella   . H/O varicose veins   . Hepatic cirrhosis ([place])   . Hepatitis C   . History of chemotherapy 2013   left breast cancer  . History of measles, mumps, or rubella   . Hoarseness   . Hx of radiation therapy 12/15/11 - 01/29/12   left breast  . Hypothyroidism   . Irregular heart beat    under control  . Joint  pain   . Leg cramps   . Neuromuscular disorder ([place])    left foot nerve damage- neuropathy   . Osteopenia   . Palpitations   . Pre-diabetes   . Rheumatoid arthritis ([place])   . Ringing in ear   . Shortness of breath   . Swallowing difficulty   . Swelling of both lower extremities   . Thrombocytopenia, primary ([place])   . TTP (thrombotic thrombocytopenic purpura) ([place])     PAST SURGICAL HISTORY: Past Surgical History:  Procedure Laterality Date  . BREAST LUMPECTOMY  08/03/11   left lumpectomy and slnbx,T1cN0,triple pos  . COLONOSCOPY    . elbow pins Left   . FOOT SURGERY Left    multiple  . POLYPECTOMY    . PORTACATH PLACEMENT  08/03/2011   Procedure: INSERTION PORT-A-CATH;  Surgeon: Merrie Roof, MD;  Location: [place];  Service: General;  Laterality: Right;  . portacath removal    . TOTAL HIP ARTHROPLASTY Bilateral   . UPPER GASTROINTESTINAL ENDOSCOPY      SOCIAL HISTORY: Social History   Tobacco Use  . Smoking status: Never Smoker  . Smokeless tobacco: Never Used  Substance Use Topics  . Alcohol use: Yes    Alcohol/week: 1.0 standard drinks    Types: 1 Glasses of wine per week    Comment: occ  . Drug use: No    FAMILY HISTORY: Family History  Problem Relation Age of Onset  . Pneumonia Mother   . COPD Mother   . Colon polyps Mother   . Heart disease Mother   . Thyroid disease Mother   . Obesity Mother   . COPD Father   . Obesity Father   . Brain cancer Maternal Grandfather   . Alcohol abuse Maternal Grandfather   . Hyperlipidemia Neg Hx   . Hypertension Neg Hx   . Colon cancer Neg Hx   . Rectal cancer Neg Hx   . Stomach cancer Neg Hx   . Esophageal cancer Neg Hx    ROS: Review of Systems  Gastrointestinal: Negative for nausea and vomiting.  Musculoskeletal:       Negative for muscle weakness.   PHYSICAL EXAM: Pt in no acute distress  RECENT LABS AND TESTS: BMET    Component Value Date/Time   NA 141 01/16/2019 1529   NA 137 10/10/2018 1347    NA 136 04/20/2017 1422   K 4.4 01/16/2019 1529   K 4.0 04/20/2017 1422   CL 102 01/16/2019 1529   CL 108 (H) 12/05/2012 0953   CO2 26 01/16/2019 1529   CO2 28 04/20/2017 1422   GLUCOSE 91 01/16/2019 1529   GLUCOSE 136 04/20/2017 1422   GLUCOSE 112 (H) 12/05/2012 0953   BUN 13 01/16/2019 1529   BUN 13 10/10/2018 1347   BUN 19.8 04/20/2017 1422   CREATININE 0.99 01/16/2019 1529   CREATININE 1.0 04/20/2017 1422   CALCIUM 8.7 01/16/2019 1529   CALCIUM 9.0 04/20/2017 1422   GFRNONAA 60 01/16/2019 1529   GFRAA 70 01/16/2019 1529   Lab Results  Component Value Date   HGBA1C 5.8 (H) 01/09/2019   HGBA1C 6.2 (H) 10/10/2018   HGBA1C 6.3 (H) 07/08/2018   HGBA1C 6.1 (H) 01/25/2018   HGBA1C 6.3 (H) 10/25/2017   Lab Results  Component Value Date   INSULIN 31.9 (H) 10/10/2018   CBC    Component Value Date/Time   WBC 8.7 01/16/2019 1529   RBC 4.22 01/16/2019 1529   HGB 13.2 01/16/2019 1529   HGB 12.7 04/27/2018 1234   HGB 12.9 04/20/2017 1422   HCT 38.8 01/16/2019 1529   HCT 38.7 04/20/2017 1422   PLT 204 01/16/2019 1529   PLT 169 04/27/2018 1234   PLT 168 04/20/2017 1422   MCV 91.9 01/16/2019 1529   MCV 96.0 04/20/2017 1422   MCH 31.3 01/16/2019 1529   MCHC 34.0 01/16/2019 1529   RDW 14.0 01/16/2019 1529   RDW 14.0 04/20/2017 1422   LYMPHSABS 1,296 01/16/2019 1529   LYMPHSABS 1.0 04/20/2017 1422   MONOABS 0.7 06/14/2018 1144   MONOABS 0.6 04/20/2017 1422   EOSABS 531 (H) 01/16/2019 1529   EOSABS 0.3 04/20/2017 1422   BASOSABS 78 01/16/2019 1529   BASOSABS 0.0 04/20/2017 1422   Iron/TIBC/Ferritin/ %Sat No results found for: IRON, TIBC, FERRITIN, IRONPCTSAT Lipid Panel     Component Value Date/Time   CHOL 236 (H) 01/09/2019 0924   TRIG 230 (H) 01/09/2019 0924   HDL 46 (L) 01/09/2019 0924   CHOLHDL 5.1 (H) 01/09/2019 0924   VLDL 29 06/29/2016 1103   LDLCALC 151 (H) 01/09/2019 0924   Hepatic Function Panel     Component Value Date/Time   PROT 7.5 01/16/2019  1529   PROT 7.5 10/10/2018 1347   PROT 7.4 04/20/2017 1422   ALBUMIN 4.4 10/10/2018 1347   ALBUMIN 3.7 04/20/2017 1422   AST 15 01/16/2019 1529   AST 32 04/20/2017 1422   ALT 17 01/16/2019 1529   ALT 32 (H) 04/21/2017 1120   ALT 35 04/20/2017 1422   ALKPHOS 61 10/10/2018 1347   ALKPHOS 81 04/20/2017 1422   BILITOT 0.7 01/16/2019 1529   BILITOT 0.5 10/10/2018 1347   BILITOT 0.78 04/20/2017 1422   BILIDIR 0.2 01/09/2019 0924   IBILI 0.7 01/09/2019 0924      Component Value Date/Time   TSH 3.15 01/09/2019 0924   TSH 2.21 07/08/2018 0925   TSH 1.53 10/21/2017 1250    Results for SONA, [place] (MRN [id number]) as of 02/28/2019 16:20  Ref. Range 01/16/2019 15:29  Vitamin D, 25-Hydroxy Latest Ref Range: 30 - 100  ng/mL 18 (L)   I, Michaelene Song, am acting as Location manager for [employer], DO  I have reviewed the above documentation for accuracy and completeness, and I agree with the above. -Jearld Lesch, DO

## 2019-03-11 ENCOUNTER — Other Ambulatory Visit: Payer: Self-pay | Admitting: Internal Medicine

## 2019-03-13 ENCOUNTER — Encounter (INDEPENDENT_AMBULATORY_CARE_PROVIDER_SITE_OTHER): Payer: Self-pay

## 2019-03-14 ENCOUNTER — Telehealth (INDEPENDENT_AMBULATORY_CARE_PROVIDER_SITE_OTHER): Payer: BC Managed Care – PPO | Admitting: Bariatrics

## 2019-03-14 ENCOUNTER — Encounter (INDEPENDENT_AMBULATORY_CARE_PROVIDER_SITE_OTHER): Payer: Self-pay | Admitting: Bariatrics

## 2019-03-14 ENCOUNTER — Other Ambulatory Visit: Payer: Self-pay

## 2019-03-14 DIAGNOSIS — F3289 Other specified depressive episodes: Secondary | ICD-10-CM | POA: Diagnosis not present

## 2019-03-14 DIAGNOSIS — Z6835 Body mass index (BMI) 35.0-35.9, adult: Secondary | ICD-10-CM | POA: Diagnosis not present

## 2019-03-14 DIAGNOSIS — E559 Vitamin D deficiency, unspecified: Secondary | ICD-10-CM | POA: Diagnosis not present

## 2019-03-15 NOTE — Progress Notes (Signed)
Office: [phone]  /  Fax: [phone] TeleHealth Visit:  Tara [name] has verbally consented to this TeleHealth visit today. The patient is located at home, the provider is located at the [facility] office. The participants in this visit include the listed provider and patient. The visit was conducted today via FaceTime.  HPI:   Chief Complaint: OBESITY Tara [name] is here to discuss her progress with her obesity treatment plan. She is on the Category 1 plan and is following her eating plan approximately 30-40% of the time. She states she is exercising 0 minutes 0 times per week. Tara [name] states that her weight is the same (235 lbs). She went to [employer] today. She states she was not very good (boredom eating). We were unable to weigh the patient today for this TeleHealth visit. She feels as if she has maintained her weight since her last visit. She has lost 4 lbs since starting treatment with [name].  Depression with emotional eating behaviors Evolet is struggling with emotional eating and using food for comfort to the extent that it is negatively impacting her health. She often snacks when she is not hungry. Sangita sometimes feels she is out of control and then feels guilty that she made poor food choices. She has been working on behavior modification techniques to help reduce her emotional eating and has been somewhat successful. Jaala is taking Wellbutrin and shows no sign of suicidal or homicidal ideations.  Depression screen [facility] 2/9 01/16/2019 11/10/2018 10/25/2018 10/10/2018 07/24/2018  Decreased Interest 0 1 1 3  -  Down, Depressed, Hopeless 0 0 0 1 1  PHQ - 2 Score 0 1 1 4 1   Altered sleeping - 3 2 1  0  Tired, decreased energy - 2 2 2 1   Change in appetite - 1 2 3 1   Feeling bad or failure about yourself  - 0 2 3 1   Trouble concentrating - 2 2 3 1   Moving slowly or fidgety/restless - 0 0 0 1  Suicidal thoughts - 0 0 1 0  PHQ-9 Score - 9 11 17 6   Difficult doing work/chores - - -  Not difficult at all Somewhat difficult  Some recent data might be hidden   Vitamin D deficiency Marenda has a diagnosis of Vitamin D deficiency. Her last Vitamin D level was reported to be 18.0 on 01/16/2019. She is currently taking Vit D and denies nausea, vomiting or muscle weakness.  ASSESSMENT AND PLAN:  Other depression  Vitamin D deficiency  Class 2 severe obesity with serious comorbidity and body mass index (BMI) of 35.0 to 35.9 in adult, unspecified obesity type ([place])  PLAN:  Depression with Emotional Eating Behaviors We discussed behavior modification techniques today to help Tara [name] deal with her emotional eating and depression. [name] will continue Wellbutrin and agrees to follow-up as directed.  Vitamin D Deficiency Tara [name] was informed that low Vitamin D levels contributes to fatigue and are associated with obesity, breast, and colon cancer. She agrees to continue taking Vit D and will follow-up for routine testing of Vitamin D, at least 2-3 times per year. She was informed of the risk of over-replacement of Vitamin D and agrees to not increase her dose unless she discusses this with [name] first. [name] agrees to follow-up with our clinic in 2 weeks.  Obesity Tara [name] is currently in the action stage of change. As such, her goal is to continue with weight loss efforts. She has agreed to follow the Category 1 plan.  [name]  will work on meal planning, intentional eating, and increasing her water intake. [name] has been instructed to continue walking for weight loss and overall health benefits. We discussed the following Behavioral Modification Strategies today: increasing lean protein intake, decreasing simple carbohydrates, increasing vegetables, increase H20 intake, decrease eating out, no skipping meals, work on meal planning and easy cooking plans, and keeping healthy foods in the home.  [name] has agreed to follow-up with our clinic in 2 weeks. She was informed of the importance of frequent  follow-up visits to maximize her success with intensive lifestyle modifications for her multiple health conditions.  ALLERGIES: Allergies  Allergen Reactions  . Aspirin Other (See Comments)    Pt has a hx of TTP.   . Nsaids Other (See Comments)    Pt has a hx of TTP.     MEDICATIONS: Current Outpatient Medications on File Prior to Visit  Medication Sig Dispense Refill  . acetaminophen (TYLENOL) 500 MG tablet Take 500 mg by mouth every 6 (six) hours as needed for mild pain, moderate pain or headache.     . ADVAIR DISKUS 250-50 MCG/DOSE AEPB INHALE ONE PUFF BY MOUTH TWICE A DAY 60 each 1  . albuterol (PROVENTIL HFA;VENTOLIN HFA) 108 (90 BASE) MCG/ACT inhaler Inhale 2 puffs into the lungs every 6 (six) hours as needed for wheezing or shortness of breath.     Marland Kitchen buPROPion (WELLBUTRIN SR) 200 MG 12 hr tablet Take 1 tablet (200 mg total) by mouth daily. 90 tablet 0  . digoxin (LANOXIN) 0.25 MG tablet TAKE ONE TABLET BY MOUTH DAILY 60 tablet 2  . DULoxetine (CYMBALTA) 30 MG capsule TAKE ONE CAPSULE BY MOUTH DAILY 90 capsule 0  . DULoxetine (CYMBALTA) 60 MG capsule Take 60 mg by mouth daily.    . fluticasone (FLONASE) 50 MCG/ACT nasal spray Place 2 sprays into both nostrils daily as needed for rhinitis.    Marland Kitchen gabapentin (NEURONTIN) 300 MG capsule TAKE ONE CAPSULE BY MOUTH TWICE A DAY 60 capsule 4  . levothyroxine (SYNTHROID) 88 MCG tablet TAKE ONE TABLET BY MOUTH DAILY 90 tablet 0  . magnesium oxide (MAG-OX) 400 MG tablet Take 400 mg by mouth daily.    . metFORMIN (GLUCOPHAGE) 500 MG tablet TAKE ONE TABLET BY MOUTH TWO TIMES A DAY WITH A MEAL 180 tablet 3  . montelukast (SINGULAIR) 10 MG tablet TAKE ONE TABLET (10 MG) BY MOUTH AT BEDTIME. 90 tablet 1  . Multiple Vitamin (MULTIVITAMIN WITH MINERALS) TABS tablet Take 1 tablet by mouth daily.    . potassium chloride SA (K-DUR,KLOR-CON) 20 MEQ tablet TAKE ONE TABLET BY MOUTH DAILY 90 tablet 2  . torsemide (DEMADEX) 20 MG tablet TAKE ONE TABLET BY  MOUTH EVERY DAY. 90 tablet 1  . Vitamin D, Ergocalciferol, (DRISDOL) 1.25 MG (50000 UT) CAPS capsule Take 1 capsule (50,000 Units total) by mouth every 7 (seven) days. 36 capsule 1   Current Facility-Administered Medications on File Prior to Visit  Medication Dose Route Frequency Provider Last Rate Last Dose  . 0.9 %  sodium chloride infusion  500 mL Intravenous Once [place], MD        PAST MEDICAL HISTORY: Past Medical History:  Diagnosis Date  . Allergy   . Anxiety   . Arthritis   . Asthma   . Back pain   . Blood transfusion   . Breast cancer ([place]) 2012   left breast  . Chronic edema   . Cold hands and feet   . Complication  of anesthesia    difficulty waking up/dizzy/lightheaded  . Cough   . Diabetes mellitus without complication ([place])    on Metformin-   . Dysrhythmia    irregular heartbeat, takes digoxin  . Environmental allergies   . Eye pain   . Fatigue   . GERD (gastroesophageal reflux disease)    occasionally   . H/O varicella   . H/O varicose veins   . Hepatic cirrhosis ([place])   . Hepatitis C   . History of chemotherapy 2013   left breast cancer  . History of measles, mumps, or rubella   . Hoarseness   . Hx of radiation therapy 12/15/11 - 01/29/12   left breast  . Hypothyroidism   . Irregular heart beat    under control  . Joint pain   . Leg cramps   . Neuromuscular disorder ([place])    left foot nerve damage- neuropathy   . Osteopenia   . Palpitations   . Pre-diabetes   . Rheumatoid arthritis ([place])   . Ringing in ear   . Shortness of breath   . Swallowing difficulty   . Swelling of both lower extremities   . Thrombocytopenia, primary ([place])   . TTP (thrombotic thrombocytopenic purpura) ([place])     PAST SURGICAL HISTORY: Past Surgical History:  Procedure Laterality Date  . BREAST LUMPECTOMY  08/03/11   left lumpectomy and slnbx,T1cN0,triple pos  . COLONOSCOPY    . elbow pins Left   . FOOT SURGERY Left    multiple  . POLYPECTOMY    .  PORTACATH PLACEMENT  08/03/2011   Procedure: INSERTION PORT-A-CATH;  Surgeon: Merrie Roof, MD;  Location: [facility];  Service: General;  Laterality: Right;  . portacath removal    . TOTAL HIP ARTHROPLASTY Bilateral   . UPPER GASTROINTESTINAL ENDOSCOPY      SOCIAL HISTORY: Social History   Tobacco Use  . Smoking status: Never Smoker  . Smokeless tobacco: Never Used  Substance Use Topics  . Alcohol use: Yes    Alcohol/week: 1.0 standard drinks    Types: 1 Glasses of wine per week    Comment: occ  . Drug use: No    FAMILY HISTORY: Family History  Problem Relation Age of Onset  . Pneumonia Mother   . COPD Mother   . Colon polyps Mother   . Heart disease Mother   . Thyroid disease Mother   . Obesity Mother   . COPD Father   . Obesity Father   . Brain cancer Maternal Grandfather   . Alcohol abuse Maternal Grandfather   . Hyperlipidemia Neg Hx   . Hypertension Neg Hx   . Colon cancer Neg Hx   . Rectal cancer Neg Hx   . Stomach cancer Neg Hx   . Esophageal cancer Neg Hx    ROS: Review of Systems  Gastrointestinal: Negative for nausea and vomiting.  Musculoskeletal:       Negative for muscle weakness.  Psychiatric/Behavioral: Positive for depression (emotional eating). Negative for suicidal ideas.       Negative for homicidal ideas.   PHYSICAL EXAM: Pt in no acute distress  RECENT LABS AND TESTS: BMET    Component Value Date/Time   NA 141 01/16/2019 1529   NA 137 10/10/2018 1347   NA 136 04/20/2017 1422   K 4.4 01/16/2019 1529   K 4.0 04/20/2017 1422   CL 102 01/16/2019 1529   CL 108 (H) 12/05/2012 0953   CO2 26 01/16/2019 1529  CO2 28 04/20/2017 1422   GLUCOSE 91 01/16/2019 1529   GLUCOSE 136 04/20/2017 1422   GLUCOSE 112 (H) 12/05/2012 0953   BUN 13 01/16/2019 1529   BUN 13 10/10/2018 1347   BUN 19.8 04/20/2017 1422   CREATININE 0.99 01/16/2019 1529   CREATININE 1.0 04/20/2017 1422   CALCIUM 8.7 01/16/2019 1529   CALCIUM 9.0 04/20/2017 1422    GFRNONAA 60 01/16/2019 1529   GFRAA 70 01/16/2019 1529   Lab Results  Component Value Date   HGBA1C 5.8 (H) 01/09/2019   HGBA1C 6.2 (H) 10/10/2018   HGBA1C 6.3 (H) 07/08/2018   HGBA1C 6.1 (H) 01/25/2018   HGBA1C 6.3 (H) 10/25/2017   Lab Results  Component Value Date   INSULIN 31.9 (H) 10/10/2018   CBC    Component Value Date/Time   WBC 8.7 01/16/2019 1529   RBC 4.22 01/16/2019 1529   HGB 13.2 01/16/2019 1529   HGB 12.7 04/27/2018 1234   HGB 12.9 04/20/2017 1422   HCT 38.8 01/16/2019 1529   HCT 38.7 04/20/2017 1422   PLT 204 01/16/2019 1529   PLT 169 04/27/2018 1234   PLT 168 04/20/2017 1422   MCV 91.9 01/16/2019 1529   MCV 96.0 04/20/2017 1422   MCH 31.3 01/16/2019 1529   MCHC 34.0 01/16/2019 1529   RDW 14.0 01/16/2019 1529   RDW 14.0 04/20/2017 1422   LYMPHSABS 1,296 01/16/2019 1529   LYMPHSABS 1.0 04/20/2017 1422   MONOABS 0.7 06/14/2018 1144   MONOABS 0.6 04/20/2017 1422   EOSABS 531 (H) 01/16/2019 1529   EOSABS 0.3 04/20/2017 1422   BASOSABS 78 01/16/2019 1529   BASOSABS 0.0 04/20/2017 1422   Iron/TIBC/Ferritin/ %Sat No results found for: IRON, TIBC, FERRITIN, IRONPCTSAT Lipid Panel     Component Value Date/Time   CHOL 236 (H) 01/09/2019 0924   TRIG 230 (H) 01/09/2019 0924   HDL 46 (L) 01/09/2019 0924   CHOLHDL 5.1 (H) 01/09/2019 0924   VLDL 29 06/29/2016 1103   LDLCALC 151 (H) 01/09/2019 0924   Hepatic Function Panel     Component Value Date/Time   PROT 7.5 01/16/2019 1529   PROT 7.5 10/10/2018 1347   PROT 7.4 04/20/2017 1422   ALBUMIN 4.4 10/10/2018 1347   ALBUMIN 3.7 04/20/2017 1422   AST 15 01/16/2019 1529   AST 32 04/20/2017 1422   ALT 17 01/16/2019 1529   ALT 32 (H) 04/21/2017 1120   ALT 35 04/20/2017 1422   ALKPHOS 61 10/10/2018 1347   ALKPHOS 81 04/20/2017 1422   BILITOT 0.7 01/16/2019 1529   BILITOT 0.5 10/10/2018 1347   BILITOT 0.78 04/20/2017 1422   BILIDIR 0.2 01/09/2019 0924   IBILI 0.7 01/09/2019 0924      Component Value  Date/Time   TSH 3.15 01/09/2019 0924   TSH 2.21 07/08/2018 0925   TSH 1.53 10/21/2017 1250   Results for NAZARIA, RIESEN (MRN [id number]) as of 03/15/2019 08:50  Ref. Range 01/16/2019 15:29  Vitamin D, 25-Hydroxy Latest Ref Range: 30 - 100 ng/mL 18 (L)   I, Michaelene Song, am acting as Location manager for [employer], DO  I have reviewed the above documentation for accuracy and completeness, and I agree with the above. -Jearld Lesch, DO

## 2019-03-28 ENCOUNTER — Telehealth (INDEPENDENT_AMBULATORY_CARE_PROVIDER_SITE_OTHER): Payer: Self-pay | Admitting: Psychology

## 2019-03-28 ENCOUNTER — Other Ambulatory Visit: Payer: Self-pay

## 2019-03-28 ENCOUNTER — Encounter (INDEPENDENT_AMBULATORY_CARE_PROVIDER_SITE_OTHER): Payer: Self-pay | Admitting: Bariatrics

## 2019-03-28 ENCOUNTER — Telehealth (INDEPENDENT_AMBULATORY_CARE_PROVIDER_SITE_OTHER): Payer: BC Managed Care – PPO | Admitting: Bariatrics

## 2019-03-28 DIAGNOSIS — Z6835 Body mass index (BMI) 35.0-35.9, adult: Secondary | ICD-10-CM | POA: Diagnosis not present

## 2019-03-28 DIAGNOSIS — E119 Type 2 diabetes mellitus without complications: Secondary | ICD-10-CM

## 2019-03-28 DIAGNOSIS — F3289 Other specified depressive episodes: Secondary | ICD-10-CM | POA: Diagnosis not present

## 2019-03-28 NOTE — Progress Notes (Signed)
Office: [phone]  /  Fax: [phone] TeleHealth Visit:  MADDELYNN [name] has verbally consented to this TeleHealth visit today. The patient is located at home, the provider is located at the [employer] [facility] office. The participants in this visit include the listed provider and patient. The visit was conducted today via face time.  HPI:   Chief Complaint: OBESITY Tara [name] is here to discuss her progress with her obesity treatment plan. She is on the Category 1 plan and is following her eating plan approximately 20 % of the time. She states she is walking for 5 minutes 2 times per week. Tara [name] states that she has lost 4 lbs (weight of 231 lbs). She did not feel like she has motivation. She is drinking adequate H20.  We were unable to weigh the patient today for this TeleHealth visit. She feels as if she has lost 4 lbs since her last visit. She has lost 4 lbs since starting treatment with [name].  Diabetes II, Controlled Tara [name] has a diagnosis of diabetes type II. Telesha is taking metformin and her last A1c was 5.8 (pre-diabetic range). She denies hypoglycemia. She has been working on intensive lifestyle modifications including diet, exercise, and weight loss to help control her blood glucose levels.  Depression with Emotional Eating Behaviors Tara [name] is taking Wellbutrin, and symptoms are will controlled. She states she does not need the Psychiatrist. Tara [name] struggles with emotional eating and using food for comfort to the extent that it is negatively impacting her health. She often snacks when she is not hungry. Tara [name] sometimes feels she is out of control and then feels guilty that she made poor food choices. She has been working on behavior modification techniques to help reduce her emotional eating and has been somewhat successful. She shows no sign of suicidal or homicidal ideations.  Depression screen [facility] 2/9 01/16/2019 11/10/2018 10/25/2018 10/10/2018 07/24/2018  Decreased Interest 0 1 1 3  -  Down,  Depressed, Hopeless 0 0 0 1 1  PHQ - 2 Score 0 1 1 4 1   Altered sleeping - 3 2 1  0  Tired, decreased energy - 2 2 2 1   Change in appetite - 1 2 3 1   Feeling bad or failure about yourself  - 0 2 3 1   Trouble concentrating - 2 2 3 1   Moving slowly or fidgety/restless - 0 0 0 1  Suicidal thoughts - 0 0 1 0  PHQ-9 Score - 9 11 17 6   Difficult doing work/chores - - - Not difficult at all Somewhat difficult  Some recent data might be hidden    ASSESSMENT AND PLAN:  Controlled type 2 diabetes mellitus without complication, without long-term current use of insulin (HCC)  Other depression  Class 2 severe obesity with serious comorbidity and body mass index (BMI) of 35.0 to 35.9 in adult, unspecified obesity type (HCC)  PLAN:  Diabetes II, Controlled [name] has been given extensive diabetes education by myself today including ideal fasting and post-prandial blood glucose readings, individual ideal Hgb A1c goals and hypoglycemia prevention. We discussed the importance of good blood sugar control to decrease the likelihood of diabetic complications such as nephropathy, neuropathy, limb loss, blindness, coronary artery disease, and death. We discussed the importance of intensive lifestyle modification including diet, exercise and weight loss as the first line treatment for diabetes. [name] agrees to continue her diabetes medications and she agrees to follow up with our clinic in 2 weeks.  Depression with Emotional Eating Behaviors We discussed  behavior modification techniques today to help Tara [name] deal with her emotional eating and depression. Tara [name] agrees to continue taking Wellbutrin and she agrees to follow up with our clinic in 2 weeks.  Obesity Tara [name] is currently in the action stage of change. As such, her goal is to continue with weight loss efforts She has agreed to follow the Category 1 plan Tara [name] has been instructed to work up to a goal of 150 minutes of combined cardio and strengthening exercise  per week or do back exercises for weight loss and overall health benefits. We discussed the following Behavioral Modification Strategies today: increasing lean protein intake, decreasing simple carbohydrates, increasing vegetables, increase H20 intake, decrease eating out, no skipping meals, work on meal planning and easy cooking plans, keeping healthy foods in the home, dealing with family or coworker sabotage, travel eating strategies, holiday eating strategies, and celebration eating strategies   Tara [name] has agreed to follow up with our clinic in 2 weeks. She was informed of the importance of frequent follow up visits to maximize her success with intensive lifestyle modifications for her multiple health conditions.  ALLERGIES: Allergies  Allergen Reactions  . Aspirin Other (See Comments)    Pt has a hx of TTP.   . Nsaids Other (See Comments)    Pt has a hx of TTP.     MEDICATIONS: Current Outpatient Medications on File Prior to Visit  Medication Sig Dispense Refill  . acetaminophen (TYLENOL) 500 MG tablet Take 500 mg by mouth every 6 (six) hours as needed for mild pain, moderate pain or headache.     . ADVAIR DISKUS 250-50 MCG/DOSE AEPB INHALE ONE PUFF BY MOUTH TWICE A DAY 60 each 1  . albuterol (PROVENTIL HFA;VENTOLIN HFA) 108 (90 BASE) MCG/ACT inhaler Inhale 2 puffs into the lungs every 6 (six) hours as needed for wheezing or shortness of breath.     Marland Kitchen buPROPion (WELLBUTRIN SR) 200 MG 12 hr tablet Take 1 tablet (200 mg total) by mouth daily. 90 tablet 0  . digoxin (LANOXIN) 0.25 MG tablet TAKE ONE TABLET BY MOUTH DAILY 60 tablet 2  . DULoxetine (CYMBALTA) 30 MG capsule TAKE ONE CAPSULE BY MOUTH DAILY 90 capsule 0  . DULoxetine (CYMBALTA) 60 MG capsule Take 60 mg by mouth daily.    . fluticasone (FLONASE) 50 MCG/ACT nasal spray Place 2 sprays into both nostrils daily as needed for rhinitis.    Marland Kitchen gabapentin (NEURONTIN) 300 MG capsule TAKE ONE CAPSULE BY MOUTH TWICE A DAY 60 capsule 4  .  levothyroxine (SYNTHROID) 88 MCG tablet TAKE ONE TABLET BY MOUTH DAILY 90 tablet 0  . magnesium oxide (MAG-OX) 400 MG tablet Take 400 mg by mouth daily.    . metFORMIN (GLUCOPHAGE) 500 MG tablet TAKE ONE TABLET BY MOUTH TWO TIMES A DAY WITH A MEAL 180 tablet 3  . montelukast (SINGULAIR) 10 MG tablet TAKE ONE TABLET (10 MG) BY MOUTH AT BEDTIME. 90 tablet 1  . Multiple Vitamin (MULTIVITAMIN WITH MINERALS) TABS tablet Take 1 tablet by mouth daily.    . potassium chloride SA (K-DUR,KLOR-CON) 20 MEQ tablet TAKE ONE TABLET BY MOUTH DAILY 90 tablet 2  . torsemide (DEMADEX) 20 MG tablet TAKE ONE TABLET BY MOUTH EVERY DAY. 90 tablet 1  . Vitamin D, Ergocalciferol, (DRISDOL) 1.25 MG (50000 UT) CAPS capsule Take 1 capsule (50,000 Units total) by mouth every 7 (seven) days. 36 capsule 1   Current Facility-Administered Medications on File Prior to Visit  Medication Dose Route Frequency Provider  Last Rate Last Dose  . 0.9 %  sodium chloride infusion  500 mL Intravenous Once [place], MD        PAST MEDICAL HISTORY: Past Medical History:  Diagnosis Date  . Allergy   . Anxiety   . Arthritis   . Asthma   . Back pain   . Blood transfusion   . Breast cancer ([place]) 2012   left breast  . Chronic edema   . Cold hands and feet   . Complication of anesthesia    difficulty waking up/dizzy/lightheaded  . Cough   . Diabetes mellitus without complication ([place])    on Metformin-   . Dysrhythmia    irregular heartbeat, takes digoxin  . Environmental allergies   . Eye pain   . Fatigue   . GERD (gastroesophageal reflux disease)    occasionally   . H/O varicella   . H/O varicose veins   . Hepatic cirrhosis ([place])   . Hepatitis C   . History of chemotherapy 2013   left breast cancer  . History of measles, mumps, or rubella   . Hoarseness   . Hx of radiation therapy 12/15/11 - 01/29/12   left breast  . Hypothyroidism   . Irregular heart beat    under control  . Joint pain   . Leg cramps   .  Neuromuscular disorder ([place])    left foot nerve damage- neuropathy   . Osteopenia   . Palpitations   . Pre-diabetes   . Rheumatoid arthritis ([place])   . Ringing in ear   . Shortness of breath   . Swallowing difficulty   . Swelling of both lower extremities   . Thrombocytopenia, primary ([place])   . TTP (thrombotic thrombocytopenic purpura) ([place])     PAST SURGICAL HISTORY: Past Surgical History:  Procedure Laterality Date  . BREAST LUMPECTOMY  08/03/11   left lumpectomy and slnbx,T1cN0,triple pos  . COLONOSCOPY    . elbow pins Left   . FOOT SURGERY Left    multiple  . POLYPECTOMY    . PORTACATH PLACEMENT  08/03/2011   Procedure: INSERTION PORT-A-CATH;  Surgeon: Merrie Roof, MD;  Location: [facility];  Service: General;  Laterality: Right;  . portacath removal    . TOTAL HIP ARTHROPLASTY Bilateral   . UPPER GASTROINTESTINAL ENDOSCOPY      SOCIAL HISTORY: Social History   Tobacco Use  . Smoking status: Never Smoker  . Smokeless tobacco: Never Used  Substance Use Topics  . Alcohol use: Yes    Alcohol/week: 1.0 standard drinks    Types: 1 Glasses of wine per week    Comment: occ  . Drug use: No    FAMILY HISTORY: Family History  Problem Relation Age of Onset  . Pneumonia Mother   . COPD Mother   . Colon polyps Mother   . Heart disease Mother   . Thyroid disease Mother   . Obesity Mother   . COPD Father   . Obesity Father   . Brain cancer Maternal Grandfather   . Alcohol abuse Maternal Grandfather   . Hyperlipidemia Neg Hx   . Hypertension Neg Hx   . Colon cancer Neg Hx   . Rectal cancer Neg Hx   . Stomach cancer Neg Hx   . Esophageal cancer Neg Hx     ROS: Review of Systems  Constitutional: Positive for weight loss.  Endo/Heme/Allergies:       Negative hypoglycemia  Psychiatric/Behavioral: Positive for depression. Negative for  suicidal ideas.    PHYSICAL EXAM: Pt in no acute distress  RECENT LABS AND TESTS: BMET    Component Value Date/Time   NA 141  01/16/2019 1529   NA 137 10/10/2018 1347   NA 136 04/20/2017 1422   K 4.4 01/16/2019 1529   K 4.0 04/20/2017 1422   CL 102 01/16/2019 1529   CL 108 (H) 12/05/2012 0953   CO2 26 01/16/2019 1529   CO2 28 04/20/2017 1422   GLUCOSE 91 01/16/2019 1529   GLUCOSE 136 04/20/2017 1422   GLUCOSE 112 (H) 12/05/2012 0953   BUN 13 01/16/2019 1529   BUN 13 10/10/2018 1347   BUN 19.8 04/20/2017 1422   CREATININE 0.99 01/16/2019 1529   CREATININE 1.0 04/20/2017 1422   CALCIUM 8.7 01/16/2019 1529   CALCIUM 9.0 04/20/2017 1422   GFRNONAA 60 01/16/2019 1529   GFRAA 70 01/16/2019 1529   Lab Results  Component Value Date   HGBA1C 5.8 (H) 01/09/2019   HGBA1C 6.2 (H) 10/10/2018   HGBA1C 6.3 (H) 07/08/2018   HGBA1C 6.1 (H) 01/25/2018   HGBA1C 6.3 (H) 10/25/2017   Lab Results  Component Value Date   INSULIN 31.9 (H) 10/10/2018   CBC    Component Value Date/Time   WBC 8.7 01/16/2019 1529   RBC 4.22 01/16/2019 1529   HGB 13.2 01/16/2019 1529   HGB 12.7 04/27/2018 1234   HGB 12.9 04/20/2017 1422   HCT 38.8 01/16/2019 1529   HCT 38.7 04/20/2017 1422   PLT 204 01/16/2019 1529   PLT 169 04/27/2018 1234   PLT 168 04/20/2017 1422   MCV 91.9 01/16/2019 1529   MCV 96.0 04/20/2017 1422   MCH 31.3 01/16/2019 1529   MCHC 34.0 01/16/2019 1529   RDW 14.0 01/16/2019 1529   RDW 14.0 04/20/2017 1422   LYMPHSABS 1,296 01/16/2019 1529   LYMPHSABS 1.0 04/20/2017 1422   MONOABS 0.7 06/14/2018 1144   MONOABS 0.6 04/20/2017 1422   EOSABS 531 (H) 01/16/2019 1529   EOSABS 0.3 04/20/2017 1422   BASOSABS 78 01/16/2019 1529   BASOSABS 0.0 04/20/2017 1422   Iron/TIBC/Ferritin/ %Sat No results found for: IRON, TIBC, FERRITIN, IRONPCTSAT Lipid Panel     Component Value Date/Time   CHOL 236 (H) 01/09/2019 0924   TRIG 230 (H) 01/09/2019 0924   HDL 46 (L) 01/09/2019 0924   CHOLHDL 5.1 (H) 01/09/2019 0924   VLDL 29 06/29/2016 1103   LDLCALC 151 (H) 01/09/2019 0924   Hepatic Function Panel      Component Value Date/Time   PROT 7.5 01/16/2019 1529   PROT 7.5 10/10/2018 1347   PROT 7.4 04/20/2017 1422   ALBUMIN 4.4 10/10/2018 1347   ALBUMIN 3.7 04/20/2017 1422   AST 15 01/16/2019 1529   AST 32 04/20/2017 1422   ALT 17 01/16/2019 1529   ALT 32 (H) 04/21/2017 1120   ALT 35 04/20/2017 1422   ALKPHOS 61 10/10/2018 1347   ALKPHOS 81 04/20/2017 1422   BILITOT 0.7 01/16/2019 1529   BILITOT 0.5 10/10/2018 1347   BILITOT 0.78 04/20/2017 1422   BILIDIR 0.2 01/09/2019 0924   IBILI 0.7 01/09/2019 0924      Component Value Date/Time   TSH 3.15 01/09/2019 0924   TSH 2.21 07/08/2018 0925   TSH 1.53 10/21/2017 1250      I, Trixie Dredge, am acting as Location manager for [employer], DO  I have reviewed the above documentation for accuracy and completeness, and I agree with the above. Jearld Lesch, DO

## 2019-03-28 NOTE — Telephone Encounter (Signed)
  Office: [phone]  /  Fax: [phone]  Date of Call: March 28, 2019  Time of Call: 10:22am Provider: Glennie Isle, PsyD  CONTENT: This provider called Tara [name] to check-in and to determine if she would like to schedule a follow-up appointment. A HIPAA compliant voicemail was left requesting a call back.   PLAN: This provider will wait for [facility] to call back. No further follow-up planned.

## 2019-04-11 ENCOUNTER — Other Ambulatory Visit: Payer: Self-pay

## 2019-04-11 ENCOUNTER — Telehealth (INDEPENDENT_AMBULATORY_CARE_PROVIDER_SITE_OTHER): Payer: BC Managed Care – PPO | Admitting: Bariatrics

## 2019-04-11 ENCOUNTER — Encounter (INDEPENDENT_AMBULATORY_CARE_PROVIDER_SITE_OTHER): Payer: Self-pay | Admitting: Bariatrics

## 2019-04-11 DIAGNOSIS — F3289 Other specified depressive episodes: Secondary | ICD-10-CM | POA: Diagnosis not present

## 2019-04-11 DIAGNOSIS — E119 Type 2 diabetes mellitus without complications: Secondary | ICD-10-CM

## 2019-04-11 DIAGNOSIS — Z6835 Body mass index (BMI) 35.0-35.9, adult: Secondary | ICD-10-CM | POA: Diagnosis not present

## 2019-04-12 NOTE — Progress Notes (Signed)
Office: [phone]  /  Fax: [phone] TeleHealth Visit:  Tara [name] has verbally consented to this TeleHealth visit today. The patient is located at home, the provider is located at the [employer] [facility] office. The participants in this visit include the listed provider and patient and any and all parties involved. The visit was conducted today via FaceTime.  HPI:   Chief Complaint: OBESITY Tara [name] is here to discuss her progress with her obesity treatment plan. She is on the Category 1 plan and is following her eating plan approximately 15 % of the time. She states she is walking and biking 10 minutes 3 times per week. Tara [name] states that her weight is up three pounds (weight 233 lbs today). Tara [name] has been eating out more. She is eating more protein. Tara [name] will start with a personal trainer in September. We were unable to weigh the patient today for this TeleHealth visit. She feels as if she has gained weight since her last visit. She has lost 4 lbs since starting treatment with [name].  Controlled Diabetes type II Tara [name] has a diagnosis of diabetes type II. She is taking metformin. Her A1c is in the prediabetes range. Her last A1c was at 5.8 She has been working on intensive lifestyle modifications including diet, exercise, and weight loss to help control her blood glucose levels.  Depression with emotional eating behaviors Tara [name] is struggling with emotional eating and using food for comfort to the extent that it is negatively impacting her health. She often snacks when she is not hungry. Tara [name] sometimes feels she is out of control and then feels guilty that she made poor food choices. Tara [name] is taking Wellbutrin. She has been working on behavior modification techniques to help reduce her emotional eating and has been somewhat successful. She shows no sign of suicidal or homicidal ideations.  ASSESSMENT AND PLAN:  Controlled type 2 diabetes mellitus without complication, without long-term  current use of insulin (HCC)  Other depression  Class 2 severe obesity with serious comorbidity and body mass index (BMI) of 35.0 to 35.9 in adult, unspecified obesity type ([place])  PLAN:  Controlled Diabetes type II Tara [name] has been given extensive diabetes education by myself today including ideal fasting and post-prandial blood glucose readings, individual ideal Hgb A1c goals and hypoglycemia prevention. We discussed the importance of good blood sugar control to decrease the likelihood of diabetic complications such as nephropathy, neuropathy, limb loss, blindness, coronary artery disease, and death. We discussed the importance of intensive lifestyle modification including diet, exercise and weight loss as the first line treatment for diabetes. Tara [name] will continue her diabetes medications and will follow up at the agreed upon time.  Depression with Emotional Eating Behaviors We discussed behavior modification techniques today to help Tara [name] deal with her emotional eating and depression. She will continue Wellbutrin SR 200 mg daily and follow up as directed.  Obesity Tara [name] is currently in the action stage of change. As such, her goal is to continue with weight loss efforts She has agreed to follow the Category 1 plan Tara [name] will continue her exercise regimen and she will begin to work with a Physiological scientist for weight loss and overall health benefits. We discussed the following Behavioral Modification Strategies today: increase H2O intake, no skipping meals, keeping healthy foods in the home, increasing lean protein intake, decreasing simple carbohydrates, increasing vegetables, decrease eating out and work on meal planning and intentional eating  Tara [name] has agreed to follow up with our clinic in  2 weeks. She was informed of the importance of frequent follow up visits to maximize her success with intensive lifestyle modifications for her multiple health conditions.  ALLERGIES: Allergies  Allergen  Reactions  . Aspirin Other (See Comments)    Pt has a hx of TTP.   . Nsaids Other (See Comments)    Pt has a hx of TTP.     MEDICATIONS: Current Outpatient Medications on File Prior to Visit  Medication Sig Dispense Refill  . acetaminophen (TYLENOL) 500 MG tablet Take 500 mg by mouth every 6 (six) hours as needed for mild pain, moderate pain or headache.     . ADVAIR DISKUS 250-50 MCG/DOSE AEPB INHALE ONE PUFF BY MOUTH TWICE A DAY 60 each 1  . albuterol (PROVENTIL HFA;VENTOLIN HFA) 108 (90 BASE) MCG/ACT inhaler Inhale 2 puffs into the lungs every 6 (six) hours as needed for wheezing or shortness of breath.     Marland Kitchen buPROPion (WELLBUTRIN SR) 200 MG 12 hr tablet Take 1 tablet (200 mg total) by mouth daily. 90 tablet 0  . digoxin (LANOXIN) 0.25 MG tablet TAKE ONE TABLET BY MOUTH DAILY 60 tablet 2  . DULoxetine (CYMBALTA) 30 MG capsule TAKE ONE CAPSULE BY MOUTH DAILY 90 capsule 0  . DULoxetine (CYMBALTA) 60 MG capsule Take 60 mg by mouth daily.    . fluticasone (FLONASE) 50 MCG/ACT nasal spray Place 2 sprays into both nostrils daily as needed for rhinitis.    Marland Kitchen gabapentin (NEURONTIN) 300 MG capsule TAKE ONE CAPSULE BY MOUTH TWICE A DAY 60 capsule 4  . levothyroxine (SYNTHROID) 88 MCG tablet TAKE ONE TABLET BY MOUTH DAILY 90 tablet 0  . magnesium oxide (MAG-OX) 400 MG tablet Take 400 mg by mouth daily.    . metFORMIN (GLUCOPHAGE) 500 MG tablet TAKE ONE TABLET BY MOUTH TWO TIMES A DAY WITH A MEAL 180 tablet 3  . montelukast (SINGULAIR) 10 MG tablet TAKE ONE TABLET (10 MG) BY MOUTH AT BEDTIME. 90 tablet 1  . Multiple Vitamin (MULTIVITAMIN WITH MINERALS) TABS tablet Take 1 tablet by mouth daily.    . potassium chloride SA (K-DUR,KLOR-CON) 20 MEQ tablet TAKE ONE TABLET BY MOUTH DAILY 90 tablet 2  . torsemide (DEMADEX) 20 MG tablet TAKE ONE TABLET BY MOUTH EVERY DAY. 90 tablet 1  . Vitamin D, Ergocalciferol, (DRISDOL) 1.25 MG (50000 UT) CAPS capsule Take 1 capsule (50,000 Units total) by mouth every 7  (seven) days. 36 capsule 1   Current Facility-Administered Medications on File Prior to Visit  Medication Dose Route Frequency Provider Last Rate Last Dose  . 0.9 %  sodium chloride infusion  500 mL Intravenous Once Thornton Park, MD        PAST MEDICAL HISTORY: Past Medical History:  Diagnosis Date  . Allergy   . Anxiety   . Arthritis   . Asthma   . Back pain   . Blood transfusion   . Breast cancer ([place]) 2012   left breast  . Chronic edema   . Cold hands and feet   . Complication of anesthesia    difficulty waking up/dizzy/lightheaded  . Cough   . Diabetes mellitus without complication ([place])    on Metformin-   . Dysrhythmia    irregular heartbeat, takes digoxin  . Environmental allergies   . Eye pain   . Fatigue   . GERD (gastroesophageal reflux disease)    occasionally   . H/O varicella   . H/O varicose veins   . Hepatic cirrhosis ([place])   .  Hepatitis C   . History of chemotherapy 2013   left breast cancer  . History of measles, mumps, or rubella   . Hoarseness   . Hx of radiation therapy 12/15/11 - 01/29/12   left breast  . Hypothyroidism   . Irregular heart beat    under control  . Joint pain   . Leg cramps   . Neuromuscular disorder ([place])    left foot nerve damage- neuropathy   . Osteopenia   . Palpitations   . Pre-diabetes   . Rheumatoid arthritis ([place])   . Ringing in ear   . Shortness of breath   . Swallowing difficulty   . Swelling of both lower extremities   . Thrombocytopenia, primary ([place])   . TTP (thrombotic thrombocytopenic purpura) ([place])     PAST SURGICAL HISTORY: Past Surgical History:  Procedure Laterality Date  . BREAST LUMPECTOMY  08/03/11   left lumpectomy and slnbx,T1cN0,triple pos  . COLONOSCOPY    . elbow pins Left   . FOOT SURGERY Left    multiple  . POLYPECTOMY    . PORTACATH PLACEMENT  08/03/2011   Procedure: INSERTION PORT-A-CATH;  Surgeon: Merrie Roof, MD;  Location: [facility];  Service: General;  Laterality: Right;  .  portacath removal    . TOTAL HIP ARTHROPLASTY Bilateral   . UPPER GASTROINTESTINAL ENDOSCOPY      SOCIAL HISTORY: Social History   Tobacco Use  . Smoking status: Never Smoker  . Smokeless tobacco: Never Used  Substance Use Topics  . Alcohol use: Yes    Alcohol/week: 1.0 standard drinks    Types: 1 Glasses of wine per week    Comment: occ  . Drug use: No    FAMILY HISTORY: Family History  Problem Relation Age of Onset  . Pneumonia Mother   . COPD Mother   . Colon polyps Mother   . Heart disease Mother   . Thyroid disease Mother   . Obesity Mother   . COPD Father   . Obesity Father   . Brain cancer Maternal Grandfather   . Alcohol abuse Maternal Grandfather   . Hyperlipidemia Neg Hx   . Hypertension Neg Hx   . Colon cancer Neg Hx   . Rectal cancer Neg Hx   . Stomach cancer Neg Hx   . Esophageal cancer Neg Hx     ROS: Review of Systems  Constitutional: Negative for weight loss.  Psychiatric/Behavioral: Positive for depression. Negative for suicidal ideas.    PHYSICAL EXAM: Pt in no acute distress  RECENT LABS AND TESTS: BMET    Component Value Date/Time   NA 141 01/16/2019 1529   NA 137 10/10/2018 1347   NA 136 04/20/2017 1422   K 4.4 01/16/2019 1529   K 4.0 04/20/2017 1422   CL 102 01/16/2019 1529   CL 108 (H) 12/05/2012 0953   CO2 26 01/16/2019 1529   CO2 28 04/20/2017 1422   GLUCOSE 91 01/16/2019 1529   GLUCOSE 136 04/20/2017 1422   GLUCOSE 112 (H) 12/05/2012 0953   BUN 13 01/16/2019 1529   BUN 13 10/10/2018 1347   BUN 19.8 04/20/2017 1422   CREATININE 0.99 01/16/2019 1529   CREATININE 1.0 04/20/2017 1422   CALCIUM 8.7 01/16/2019 1529   CALCIUM 9.0 04/20/2017 1422   GFRNONAA 60 01/16/2019 1529   GFRAA 70 01/16/2019 1529   Lab Results  Component Value Date   HGBA1C 5.8 (H) 01/09/2019   HGBA1C 6.2 (H) 10/10/2018   HGBA1C 6.3 (  H) 07/08/2018   HGBA1C 6.1 (H) 01/25/2018   HGBA1C 6.3 (H) 10/25/2017   Lab Results  Component Value Date    INSULIN 31.9 (H) 10/10/2018   CBC    Component Value Date/Time   WBC 8.7 01/16/2019 1529   RBC 4.22 01/16/2019 1529   HGB 13.2 01/16/2019 1529   HGB 12.7 04/27/2018 1234   HGB 12.9 04/20/2017 1422   HCT 38.8 01/16/2019 1529   HCT 38.7 04/20/2017 1422   PLT 204 01/16/2019 1529   PLT 169 04/27/2018 1234   PLT 168 04/20/2017 1422   MCV 91.9 01/16/2019 1529   MCV 96.0 04/20/2017 1422   MCH 31.3 01/16/2019 1529   MCHC 34.0 01/16/2019 1529   RDW 14.0 01/16/2019 1529   RDW 14.0 04/20/2017 1422   LYMPHSABS 1,296 01/16/2019 1529   LYMPHSABS 1.0 04/20/2017 1422   MONOABS 0.7 06/14/2018 1144   MONOABS 0.6 04/20/2017 1422   EOSABS 531 (H) 01/16/2019 1529   EOSABS 0.3 04/20/2017 1422   BASOSABS 78 01/16/2019 1529   BASOSABS 0.0 04/20/2017 1422   Iron/TIBC/Ferritin/ %Sat No results found for: IRON, TIBC, FERRITIN, IRONPCTSAT Lipid Panel     Component Value Date/Time   CHOL 236 (H) 01/09/2019 0924   TRIG 230 (H) 01/09/2019 0924   HDL 46 (L) 01/09/2019 0924   CHOLHDL 5.1 (H) 01/09/2019 0924   VLDL 29 06/29/2016 1103   LDLCALC 151 (H) 01/09/2019 0924   Hepatic Function Panel     Component Value Date/Time   PROT 7.5 01/16/2019 1529   PROT 7.5 10/10/2018 1347   PROT 7.4 04/20/2017 1422   ALBUMIN 4.4 10/10/2018 1347   ALBUMIN 3.7 04/20/2017 1422   AST 15 01/16/2019 1529   AST 32 04/20/2017 1422   ALT 17 01/16/2019 1529   ALT 32 (H) 04/21/2017 1120   ALT 35 04/20/2017 1422   ALKPHOS 61 10/10/2018 1347   ALKPHOS 81 04/20/2017 1422   BILITOT 0.7 01/16/2019 1529   BILITOT 0.5 10/10/2018 1347   BILITOT 0.78 04/20/2017 1422   BILIDIR 0.2 01/09/2019 0924   IBILI 0.7 01/09/2019 0924      Component Value Date/Time   TSH 3.15 01/09/2019 0924   TSH 2.21 07/08/2018 0925   TSH 1.53 10/21/2017 1250     Ref. Range 01/16/2019 15:29  Vitamin D, 25-Hydroxy Latest Ref Range: 30 - 100 ng/mL 18 (L)    I, Doreene Nest, am acting as Location manager for [employer]. Owens Shark, DO  I have  reviewed the above documentation for accuracy and completeness, and I agree with the above. -Jearld Lesch, DO

## 2019-04-17 ENCOUNTER — Other Ambulatory Visit (INDEPENDENT_AMBULATORY_CARE_PROVIDER_SITE_OTHER): Payer: Self-pay | Admitting: Family Medicine

## 2019-04-17 DIAGNOSIS — F3289 Other specified depressive episodes: Secondary | ICD-10-CM

## 2019-04-25 ENCOUNTER — Telehealth (INDEPENDENT_AMBULATORY_CARE_PROVIDER_SITE_OTHER): Payer: BC Managed Care – PPO | Admitting: Bariatrics

## 2019-04-25 ENCOUNTER — Other Ambulatory Visit: Payer: Self-pay

## 2019-04-25 ENCOUNTER — Encounter (INDEPENDENT_AMBULATORY_CARE_PROVIDER_SITE_OTHER): Payer: Self-pay | Admitting: Bariatrics

## 2019-04-25 DIAGNOSIS — Z6837 Body mass index (BMI) 37.0-37.9, adult: Secondary | ICD-10-CM

## 2019-04-25 DIAGNOSIS — F3289 Other specified depressive episodes: Secondary | ICD-10-CM | POA: Diagnosis not present

## 2019-04-25 DIAGNOSIS — E119 Type 2 diabetes mellitus without complications: Secondary | ICD-10-CM | POA: Diagnosis not present

## 2019-04-25 MED ORDER — BUPROPION HCL ER (SR) 200 MG PO TB12: 200.0000 mg | ORAL_TABLET | Freq: Every day | ORAL | 0 refills | Status: DC

## 2019-04-27 NOTE — Progress Notes (Signed)
Office: [phone]  /  Fax: [phone] TeleHealth Visit:  CHISATO [name] has verbally consented to this TeleHealth visit today. The patient is located at home, the provider is located at the [employer] [facility] office. The participants in this visit include the listed provider and patient and any and all parties involved. The visit was conducted today via FaceTime.  HPI:   Chief Complaint: OBESITY Tara [name] is here to discuss her progress with her obesity treatment plan. She is on the Category 1 plan and is following her eating plan approximately 40 to 45 % of the time. She states she is exercising 0 minutes 0 times per week. [name] states that she has gained 1 to 2 pounds (weight 233 lbs 04/25/19). [name] is getting close to finding out about her back pain (consider therapy, medications; or surgery). She is drinking more water. We were unable to weigh the patient today for this TeleHealth visit. She feels as if she has gained weight since her last visit. She has lost 4 lbs since starting treatment with [name].  Depression with emotional eating behaviors [name] is struggling with emotional eating and using food for comfort to the extent that it is negatively impacting her health. She often snacks when she is not hungry. [name] sometimes feels she is out of control and then feels guilty that she made poor food choices. [name] is taking Wellbutrin and she feels it helps with stress eating. She has been working on behavior modification techniques to help reduce her emotional eating and has been somewhat successful. She shows no sign of suicidal or homicidal ideations.  Controlled Diabetes II  [name] has a diagnosis of diabetes type II. [name] does not check her blood sugars. She denies any hypoglycemic episodes. Last A1c was at 5.8 She has been working on intensive lifestyle modifications including diet, exercise, and weight loss to help control her blood glucose levels.  ASSESSMENT AND PLAN:  Other  depression - Plan: buPROPion (WELLBUTRIN SR) 200 MG 12 hr tablet  Controlled type 2 diabetes mellitus without complication, without long-term current use of insulin (HCC)  Class 2 severe obesity with serious comorbidity and body mass index (BMI) of 37.0 to 37.9 in adult, unspecified obesity type ([place])  Other depression - with emotional eating - Plan: buPROPion (WELLBUTRIN SR) 200 MG 12 hr tablet  PLAN:  Depression with Emotional Eating Behaviors We discussed behavior modification techniques today to help [name] deal with her emotional eating and depression. She has agreed to take Wellbutrin SR 200 mg daily #30 with no refills and follow up as directed.  Controlled Diabetes II [name] has been given extensive diabetes education by myself today including ideal fasting and post-prandial blood glucose readings, individual ideal Hgb A1c goals and hypoglycemia prevention. We discussed the importance of good blood sugar control to decrease the likelihood of diabetic complications such as nephropathy, neuropathy, limb loss, blindness, coronary artery disease, and death. We discussed the importance of intensive lifestyle modification including diet, exercise and weight loss as the first line treatment for diabetes. [name] agrees to continue her diabetes medications and will follow up at the agreed upon time.  Obesity [name] is currently in the action stage of change. As such, her goal is to continue with weight loss efforts She has agreed to follow the Category 1 plan We discussed the following Behavioral Modification Strategies today: planning for success, increase H2O intake, no skipping meals, keeping healthy foods in the home, increasing lean protein intake, decreasing simple carbohydrates, increasing  vegetables, decrease eating out and work on meal planning and intentional eating [name] will schedule with the orthopedist (possible surgery).  [name] has agreed to follow up with our clinic in 3 to 4 weeks. She  was informed of the importance of frequent follow up visits to maximize her success with intensive lifestyle modifications for her multiple health conditions.  ALLERGIES: Allergies  Allergen Reactions  . Aspirin Other (See Comments)    Pt has a hx of TTP.   . Nsaids Other (See Comments)    Pt has a hx of TTP.     MEDICATIONS: Current Outpatient Medications on File Prior to Visit  Medication Sig Dispense Refill  . acetaminophen (TYLENOL) 500 MG tablet Take 500 mg by mouth every 6 (six) hours as needed for mild pain, moderate pain or headache.     . ADVAIR DISKUS 250-50 MCG/DOSE AEPB INHALE ONE PUFF BY MOUTH TWICE A DAY 60 each 1  . albuterol (PROVENTIL HFA;VENTOLIN HFA) 108 (90 BASE) MCG/ACT inhaler Inhale 2 puffs into the lungs every 6 (six) hours as needed for wheezing or shortness of breath.     . digoxin (LANOXIN) 0.25 MG tablet TAKE ONE TABLET BY MOUTH DAILY 60 tablet 2  . DULoxetine (CYMBALTA) 30 MG capsule TAKE ONE CAPSULE BY MOUTH DAILY 90 capsule 0  . DULoxetine (CYMBALTA) 60 MG capsule Take 60 mg by mouth daily.    . fluticasone (FLONASE) 50 MCG/ACT nasal spray Place 2 sprays into both nostrils daily as needed for rhinitis.    Marland Kitchen gabapentin (NEURONTIN) 300 MG capsule TAKE ONE CAPSULE BY MOUTH TWICE A DAY 60 capsule 4  . levothyroxine (SYNTHROID) 88 MCG tablet TAKE ONE TABLET BY MOUTH DAILY 90 tablet 0  . magnesium oxide (MAG-OX) 400 MG tablet Take 400 mg by mouth daily.    . metFORMIN (GLUCOPHAGE) 500 MG tablet TAKE ONE TABLET BY MOUTH TWO TIMES A DAY WITH A MEAL 180 tablet 3  . montelukast (SINGULAIR) 10 MG tablet TAKE ONE TABLET (10 MG) BY MOUTH AT BEDTIME. 90 tablet 1  . Multiple Vitamin (MULTIVITAMIN WITH MINERALS) TABS tablet Take 1 tablet by mouth daily.    . potassium chloride SA (K-DUR,KLOR-CON) 20 MEQ tablet TAKE ONE TABLET BY MOUTH DAILY 90 tablet 2  . torsemide (DEMADEX) 20 MG tablet TAKE ONE TABLET BY MOUTH EVERY DAY. 90 tablet 1  . Vitamin D, Ergocalciferol,  (DRISDOL) 1.25 MG (50000 UT) CAPS capsule Take 1 capsule (50,000 Units total) by mouth every 7 (seven) days. 36 capsule 1   Current Facility-Administered Medications on File Prior to Visit  Medication Dose Route Frequency Provider Last Rate Last Dose  . 0.9 %  sodium chloride infusion  500 mL Intravenous Once Thornton Park, MD        PAST MEDICAL HISTORY: Past Medical History:  Diagnosis Date  . Allergy   . Anxiety   . Arthritis   . Asthma   . Back pain   . Blood transfusion   . Breast cancer ([place]) 2012   left breast  . Chronic edema   . Cold hands and feet   . Complication of anesthesia    difficulty waking up/dizzy/lightheaded  . Cough   . Diabetes mellitus without complication ([place])    on Metformin-   . Dysrhythmia    irregular heartbeat, takes digoxin  . Environmental allergies   . Eye pain   . Fatigue   . GERD (gastroesophageal reflux disease)    occasionally   . H/O varicella   .  H/O varicose veins   . Hepatic cirrhosis ([place])   . Hepatitis C   . History of chemotherapy 2013   left breast cancer  . History of measles, mumps, or rubella   . Hoarseness   . Hx of radiation therapy 12/15/11 - 01/29/12   left breast  . Hypothyroidism   . Irregular heart beat    under control  . Joint pain   . Leg cramps   . Neuromuscular disorder ([place])    left foot nerve damage- neuropathy   . Osteopenia   . Palpitations   . Pre-diabetes   . Rheumatoid arthritis ([place])   . Ringing in ear   . Shortness of breath   . Swallowing difficulty   . Swelling of both lower extremities   . Thrombocytopenia, primary ([place])   . TTP (thrombotic thrombocytopenic purpura) ([place])     PAST SURGICAL HISTORY: Past Surgical History:  Procedure Laterality Date  . BREAST LUMPECTOMY  08/03/11   left lumpectomy and slnbx,T1cN0,triple pos  . COLONOSCOPY    . elbow pins Left   . FOOT SURGERY Left    multiple  . POLYPECTOMY    . PORTACATH PLACEMENT  08/03/2011   Procedure: INSERTION  PORT-A-CATH;  Surgeon: Merrie Roof, MD;  Location: [facility];  Service: General;  Laterality: Right;  . portacath removal    . TOTAL HIP ARTHROPLASTY Bilateral   . UPPER GASTROINTESTINAL ENDOSCOPY      SOCIAL HISTORY: Social History   Tobacco Use  . Smoking status: Never Smoker  . Smokeless tobacco: Never Used  Substance Use Topics  . Alcohol use: Yes    Alcohol/week: 1.0 standard drinks    Types: 1 Glasses of wine per week    Comment: occ  . Drug use: No    FAMILY HISTORY: Family History  Problem Relation Age of Onset  . Pneumonia Mother   . COPD Mother   . Colon polyps Mother   . Heart disease Mother   . Thyroid disease Mother   . Obesity Mother   . COPD Father   . Obesity Father   . Brain cancer Maternal Grandfather   . Alcohol abuse Maternal Grandfather   . Hyperlipidemia Neg Hx   . Hypertension Neg Hx   . Colon cancer Neg Hx   . Rectal cancer Neg Hx   . Stomach cancer Neg Hx   . Esophageal cancer Neg Hx     ROS: Review of Systems  Constitutional: Negative for weight loss.  Endo/Heme/Allergies:       Negative for hypoglycemia  Psychiatric/Behavioral: Positive for depression. Negative for suicidal ideas.    PHYSICAL EXAM: Pt in no acute distress  RECENT LABS AND TESTS: BMET    Component Value Date/Time   NA 141 01/16/2019 1529   NA 137 10/10/2018 1347   NA 136 04/20/2017 1422   K 4.4 01/16/2019 1529   K 4.0 04/20/2017 1422   CL 102 01/16/2019 1529   CL 108 (H) 12/05/2012 0953   CO2 26 01/16/2019 1529   CO2 28 04/20/2017 1422   GLUCOSE 91 01/16/2019 1529   GLUCOSE 136 04/20/2017 1422   GLUCOSE 112 (H) 12/05/2012 0953   BUN 13 01/16/2019 1529   BUN 13 10/10/2018 1347   BUN 19.8 04/20/2017 1422   CREATININE 0.99 01/16/2019 1529   CREATININE 1.0 04/20/2017 1422   CALCIUM 8.7 01/16/2019 1529   CALCIUM 9.0 04/20/2017 1422   GFRNONAA 60 01/16/2019 1529   GFRAA 70 01/16/2019 1529  Lab Results  Component Value Date   HGBA1C 5.8 (H) 01/09/2019    HGBA1C 6.2 (H) 10/10/2018   HGBA1C 6.3 (H) 07/08/2018   HGBA1C 6.1 (H) 01/25/2018   HGBA1C 6.3 (H) 10/25/2017   Lab Results  Component Value Date   INSULIN 31.9 (H) 10/10/2018   CBC    Component Value Date/Time   WBC 8.7 01/16/2019 1529   RBC 4.22 01/16/2019 1529   HGB 13.2 01/16/2019 1529   HGB 12.7 04/27/2018 1234   HGB 12.9 04/20/2017 1422   HCT 38.8 01/16/2019 1529   HCT 38.7 04/20/2017 1422   PLT 204 01/16/2019 1529   PLT 169 04/27/2018 1234   PLT 168 04/20/2017 1422   MCV 91.9 01/16/2019 1529   MCV 96.0 04/20/2017 1422   MCH 31.3 01/16/2019 1529   MCHC 34.0 01/16/2019 1529   RDW 14.0 01/16/2019 1529   RDW 14.0 04/20/2017 1422   LYMPHSABS 1,296 01/16/2019 1529   LYMPHSABS 1.0 04/20/2017 1422   MONOABS 0.7 06/14/2018 1144   MONOABS 0.6 04/20/2017 1422   EOSABS 531 (H) 01/16/2019 1529   EOSABS 0.3 04/20/2017 1422   BASOSABS 78 01/16/2019 1529   BASOSABS 0.0 04/20/2017 1422   Iron/TIBC/Ferritin/ %Sat No results found for: IRON, TIBC, FERRITIN, IRONPCTSAT Lipid Panel     Component Value Date/Time   CHOL 236 (H) 01/09/2019 0924   TRIG 230 (H) 01/09/2019 0924   HDL 46 (L) 01/09/2019 0924   CHOLHDL 5.1 (H) 01/09/2019 0924   VLDL 29 06/29/2016 1103   LDLCALC 151 (H) 01/09/2019 0924   Hepatic Function Panel     Component Value Date/Time   PROT 7.5 01/16/2019 1529   PROT 7.5 10/10/2018 1347   PROT 7.4 04/20/2017 1422   ALBUMIN 4.4 10/10/2018 1347   ALBUMIN 3.7 04/20/2017 1422   AST 15 01/16/2019 1529   AST 32 04/20/2017 1422   ALT 17 01/16/2019 1529   ALT 32 (H) 04/21/2017 1120   ALT 35 04/20/2017 1422   ALKPHOS 61 10/10/2018 1347   ALKPHOS 81 04/20/2017 1422   BILITOT 0.7 01/16/2019 1529   BILITOT 0.5 10/10/2018 1347   BILITOT 0.78 04/20/2017 1422   BILIDIR 0.2 01/09/2019 0924   IBILI 0.7 01/09/2019 0924      Component Value Date/Time   TSH 3.15 01/09/2019 0924   TSH 2.21 07/08/2018 0925   TSH 1.53 10/21/2017 1250     Ref. Range 01/16/2019  15:29  Vitamin D, 25-Hydroxy Latest Ref Range: 30 - 100 ng/mL 18 (L)    I, Doreene Nest, am acting as Location manager for [employer]. Owens Shark, DO  I have reviewed the above documentation for accuracy and completeness, and I agree with the above. -Jearld Lesch, DO

## 2019-05-13 ENCOUNTER — Other Ambulatory Visit: Payer: Self-pay | Admitting: Internal Medicine

## 2019-05-16 ENCOUNTER — Telehealth (INDEPENDENT_AMBULATORY_CARE_PROVIDER_SITE_OTHER): Payer: BC Managed Care – PPO | Admitting: Bariatrics

## 2019-05-16 ENCOUNTER — Other Ambulatory Visit: Payer: Self-pay

## 2019-05-16 ENCOUNTER — Other Ambulatory Visit (INDEPENDENT_AMBULATORY_CARE_PROVIDER_SITE_OTHER): Payer: Self-pay | Admitting: Bariatrics

## 2019-05-16 ENCOUNTER — Encounter (INDEPENDENT_AMBULATORY_CARE_PROVIDER_SITE_OTHER): Payer: Self-pay | Admitting: Bariatrics

## 2019-05-16 DIAGNOSIS — Z6837 Body mass index (BMI) 37.0-37.9, adult: Secondary | ICD-10-CM | POA: Diagnosis not present

## 2019-05-16 DIAGNOSIS — E119 Type 2 diabetes mellitus without complications: Secondary | ICD-10-CM

## 2019-05-16 DIAGNOSIS — F3289 Other specified depressive episodes: Secondary | ICD-10-CM | POA: Diagnosis not present

## 2019-05-18 NOTE — Progress Notes (Signed)
Office: [phone]  /  Fax: [phone] TeleHealth Visit:  Tara [name] has verbally consented to this TeleHealth visit today. The patient is located at home, the provider is located at the [employer] [facility] office. The participants in this visit include the listed provider and patient. The visit was conducted today via face time.  HPI:   Chief Complaint: OBESITY Tara [name] is here to discuss her progress with her obesity treatment plan. She is on the Category 1 plan and is following her eating plan approximately 40 % of the time. She states she is walking for 5 minutes 2 times per week. Tara [name] states that her weight remains the same (235 lbs). She states it has been hard to stick to the plan.  We were unable to weigh the patient today for this TeleHealth visit. She feels as if she has maintained her weight since her last visit. She has lost 4 lbs since starting treatment with [name].  Diabetes II Tara [name] has a diagnosis of diabetes type II. Tara [name]'s A1c is at pre-diabetes level. Last A1c was 5.8 and insulin of 31.9. She denies hypoglycemia. She has been working on intensive lifestyle modifications including diet, exercise, and weight loss to help control her blood glucose levels.  Depression with Emotional Eating Behaviors Tara [name] is currently taking Wellbutrin. Tara [name] struggles with emotional eating and using food for comfort to the extent that it is negatively impacting her health. She often snacks when she is not hungry. Tara [name] sometimes feels she is out of control and then feels guilty that she made poor food choices. She has been working on behavior modification techniques to help reduce her emotional eating and has been somewhat successful. She shows no sign of suicidal or homicidal ideations.  Depression screen [facility] 2/9 01/16/2019 11/10/2018 10/25/2018 10/10/2018 07/24/2018  Decreased Interest 0 1 1 3  -  Down, Depressed, Hopeless 0 0 0 1 1  PHQ - 2 Score 0 1 1 4 1   Altered sleeping - 3 2 1  0   Tired, decreased energy - 2 2 2 1   Change in appetite - 1 2 3 1   Feeling bad or failure about yourself  - 0 2 3 1   Trouble concentrating - 2 2 3 1   Moving slowly or fidgety/restless - 0 0 0 1  Suicidal thoughts - 0 0 1 0  PHQ-9 Score - 9 11 17 6   Difficult doing work/chores - - - Not difficult at all Somewhat difficult  Some recent data might be hidden    ASSESSMENT AND PLAN:  Other depression - with emotional eating   Controlled type 2 diabetes mellitus without complication, without long-term current use of insulin (HCC)  Class 2 severe obesity with serious comorbidity and body mass index (BMI) of 37.0 to 37.9 in adult, unspecified obesity type (HCC)  PLAN:  Diabetes II Tara [name] has been given extensive diabetes education by myself today including ideal fasting and post-prandial blood glucose readings, individual ideal Hgb A1c goals and hypoglycemia prevention. We discussed the importance of good blood sugar control to decrease the likelihood of diabetic complications such as nephropathy, neuropathy, limb loss, blindness, coronary artery disease, and death. We discussed the importance of intensive lifestyle modification including diet, exercise and weight loss as the first line treatment for diabetes. She is to decrease carbohydrates, and increase protein and activity. Tara [name] agrees to continue her diabetes medications, and she agrees to follow up with our clinic in 2 weeks.  Depression with Emotional Eating Behaviors We discussed behavior  modification techniques today to help Adene deal with her emotional eating and depression. Tara [name] agrees to continue taking Wellbutrin, and she agrees to follow up with our clinic in 2 weeks.  Obesity Tara [name] is currently in the action stage of change. As such, her goal is to continue with weight loss efforts She has agreed to follow the Category 1 plan Tara [name] has been instructed to work up to a goal of 150 minutes of combined cardio and strengthening exercise  per week for weight loss and overall health benefits. She is to walk for 30 minutes 2 days a week, and work out with a Clinical research associate. We discussed the following Behavioral Modification Strategies today: increasing lean protein intake, decreasing simple carbohydrates, increasing vegetables, decrease eating out, increase H20 intake, no skipping meals, work on meal planning and easy cooking plans, and keeping healthy foods in the home Tara [name] wants to stop until she is on medicare in January 2021 (financial concerns).  Tara [name] has agreed to follow up with our clinic in 2 weeks. She was informed of the importance of frequent follow up visits to maximize her success with intensive lifestyle modifications for her multiple health conditions.  ALLERGIES: Allergies  Allergen Reactions  . Aspirin Other (See Comments)    Pt has a hx of TTP.   . Nsaids Other (See Comments)    Pt has a hx of TTP.     MEDICATIONS: Current Outpatient Medications on File Prior to Visit  Medication Sig Dispense Refill  . acetaminophen (TYLENOL) 500 MG tablet Take 500 mg by mouth every 6 (six) hours as needed for mild pain, moderate pain or headache.     . ADVAIR DISKUS 250-50 MCG/DOSE AEPB INHALE ONE PUFF BY MOUTH TWICE A DAY 60 each 1  . albuterol (PROVENTIL HFA;VENTOLIN HFA) 108 (90 BASE) MCG/ACT inhaler Inhale 2 puffs into the lungs every 6 (six) hours as needed for wheezing or shortness of breath.     Marland Kitchen buPROPion (WELLBUTRIN SR) 200 MG 12 hr tablet Take 1 tablet (200 mg total) by mouth daily. 90 tablet 0  . digoxin (LANOXIN) 0.25 MG tablet TAKE ONE TABLET BY MOUTH DAILY 60 tablet 2  . DULoxetine (CYMBALTA) 30 MG capsule TAKE ONE CAPSULE BY MOUTH DAILY 90 capsule 0  . DULoxetine (CYMBALTA) 60 MG capsule Take 60 mg by mouth daily.    . fluticasone (FLONASE) 50 MCG/ACT nasal spray Place 2 sprays into both nostrils daily as needed for rhinitis.    Marland Kitchen gabapentin (NEURONTIN) 300 MG capsule TAKE ONE CAPSULE BY MOUTH TWICE A DAY 60 capsule  4  . levothyroxine (SYNTHROID) 88 MCG tablet TAKE ONE TABLET BY MOUTH DAILY 90 tablet 0  . magnesium oxide (MAG-OX) 400 MG tablet Take 400 mg by mouth daily.    . metFORMIN (GLUCOPHAGE) 500 MG tablet TAKE ONE TABLET BY MOUTH TWO TIMES A DAY WITH A MEAL 180 tablet 3  . montelukast (SINGULAIR) 10 MG tablet TAKE ONE TABLET (10 MG) BY MOUTH AT BEDTIME. 90 tablet 1  . Multiple Vitamin (MULTIVITAMIN WITH MINERALS) TABS tablet Take 1 tablet by mouth daily.    . potassium chloride SA (K-DUR,KLOR-CON) 20 MEQ tablet TAKE ONE TABLET BY MOUTH DAILY 90 tablet 2  . torsemide (DEMADEX) 20 MG tablet TAKE ONE TABLET BY MOUTH EVERY DAY. 90 tablet 1  . Vitamin D, Ergocalciferol, (DRISDOL) 1.25 MG (50000 UT) CAPS capsule Take 1 capsule (50,000 Units total) by mouth every 7 (seven) days. 36 capsule 1   Current Facility-Administered Medications on  File Prior to Visit  Medication Dose Route Frequency Provider Last Rate Last Dose  . 0.9 %  sodium chloride infusion  500 mL Intravenous Once [place], MD        PAST MEDICAL HISTORY: Past Medical History:  Diagnosis Date  . Allergy   . Anxiety   . Arthritis   . Asthma   . Back pain   . Blood transfusion   . Breast cancer ([place]) 2012   left breast  . Chronic edema   . Cold hands and feet   . Complication of anesthesia    difficulty waking up/dizzy/lightheaded  . Cough   . Diabetes mellitus without complication ([place])    on Metformin-   . Dysrhythmia    irregular heartbeat, takes digoxin  . Environmental allergies   . Eye pain   . Fatigue   . GERD (gastroesophageal reflux disease)    occasionally   . H/O varicella   . H/O varicose veins   . Hepatic cirrhosis ([place])   . Hepatitis C   . History of chemotherapy 2013   left breast cancer  . History of measles, mumps, or rubella   . Hoarseness   . Hx of radiation therapy 12/15/11 - 01/29/12   left breast  . Hypothyroidism   . Irregular heart beat    under control  . Joint pain   . Leg cramps    . Neuromuscular disorder ([place])    left foot nerve damage- neuropathy   . Osteopenia   . Palpitations   . Pre-diabetes   . Rheumatoid arthritis ([place])   . Ringing in ear   . Shortness of breath   . Swallowing difficulty   . Swelling of both lower extremities   . Thrombocytopenia, primary ([place])   . TTP (thrombotic thrombocytopenic purpura) ([place])     PAST SURGICAL HISTORY: Past Surgical History:  Procedure Laterality Date  . BREAST LUMPECTOMY  08/03/11   left lumpectomy and slnbx,T1cN0,triple pos  . COLONOSCOPY    . elbow pins Left   . FOOT SURGERY Left    multiple  . POLYPECTOMY    . PORTACATH PLACEMENT  08/03/2011   Procedure: INSERTION PORT-A-CATH;  Surgeon: Merrie Roof, MD;  Location: [facility];  Service: General;  Laterality: Right;  . portacath removal    . TOTAL HIP ARTHROPLASTY Bilateral   . UPPER GASTROINTESTINAL ENDOSCOPY      SOCIAL HISTORY: Social History   Tobacco Use  . Smoking status: Never Smoker  . Smokeless tobacco: Never Used  Substance Use Topics  . Alcohol use: Yes    Alcohol/week: 1.0 standard drinks    Types: 1 Glasses of wine per week    Comment: occ  . Drug use: No    FAMILY HISTORY: Family History  Problem Relation Age of Onset  . Pneumonia Mother   . COPD Mother   . Colon polyps Mother   . Heart disease Mother   . Thyroid disease Mother   . Obesity Mother   . COPD Father   . Obesity Father   . Brain cancer Maternal Grandfather   . Alcohol abuse Maternal Grandfather   . Hyperlipidemia Neg Hx   . Hypertension Neg Hx   . Colon cancer Neg Hx   . Rectal cancer Neg Hx   . Stomach cancer Neg Hx   . Esophageal cancer Neg Hx     ROS: Review of Systems  Constitutional: Negative for weight loss.  Endo/Heme/Allergies:  Negative hypoglycemia  Psychiatric/Behavioral: Positive for depression. Negative for suicidal ideas.    PHYSICAL EXAM: Pt in no acute distress  RECENT LABS AND TESTS: BMET    Component Value Date/Time   NA  141 01/16/2019 1529   NA 137 10/10/2018 1347   NA 136 04/20/2017 1422   K 4.4 01/16/2019 1529   K 4.0 04/20/2017 1422   CL 102 01/16/2019 1529   CL 108 (H) 12/05/2012 0953   CO2 26 01/16/2019 1529   CO2 28 04/20/2017 1422   GLUCOSE 91 01/16/2019 1529   GLUCOSE 136 04/20/2017 1422   GLUCOSE 112 (H) 12/05/2012 0953   BUN 13 01/16/2019 1529   BUN 13 10/10/2018 1347   BUN 19.8 04/20/2017 1422   CREATININE 0.99 01/16/2019 1529   CREATININE 1.0 04/20/2017 1422   CALCIUM 8.7 01/16/2019 1529   CALCIUM 9.0 04/20/2017 1422   GFRNONAA 60 01/16/2019 1529   GFRAA 70 01/16/2019 1529   Lab Results  Component Value Date   HGBA1C 5.8 (H) 01/09/2019   HGBA1C 6.2 (H) 10/10/2018   HGBA1C 6.3 (H) 07/08/2018   HGBA1C 6.1 (H) 01/25/2018   HGBA1C 6.3 (H) 10/25/2017   Lab Results  Component Value Date   INSULIN 31.9 (H) 10/10/2018   CBC    Component Value Date/Time   WBC 8.7 01/16/2019 1529   RBC 4.22 01/16/2019 1529   HGB 13.2 01/16/2019 1529   HGB 12.7 04/27/2018 1234   HGB 12.9 04/20/2017 1422   HCT 38.8 01/16/2019 1529   HCT 38.7 04/20/2017 1422   PLT 204 01/16/2019 1529   PLT 169 04/27/2018 1234   PLT 168 04/20/2017 1422   MCV 91.9 01/16/2019 1529   MCV 96.0 04/20/2017 1422   MCH 31.3 01/16/2019 1529   MCHC 34.0 01/16/2019 1529   RDW 14.0 01/16/2019 1529   RDW 14.0 04/20/2017 1422   LYMPHSABS 1,296 01/16/2019 1529   LYMPHSABS 1.0 04/20/2017 1422   MONOABS 0.7 06/14/2018 1144   MONOABS 0.6 04/20/2017 1422   EOSABS 531 (H) 01/16/2019 1529   EOSABS 0.3 04/20/2017 1422   BASOSABS 78 01/16/2019 1529   BASOSABS 0.0 04/20/2017 1422   Iron/TIBC/Ferritin/ %Sat No results found for: IRON, TIBC, FERRITIN, IRONPCTSAT Lipid Panel     Component Value Date/Time   CHOL 236 (H) 01/09/2019 0924   TRIG 230 (H) 01/09/2019 0924   HDL 46 (L) 01/09/2019 0924   CHOLHDL 5.1 (H) 01/09/2019 0924   VLDL 29 06/29/2016 1103   LDLCALC 151 (H) 01/09/2019 0924   Hepatic Function Panel      Component Value Date/Time   PROT 7.5 01/16/2019 1529   PROT 7.5 10/10/2018 1347   PROT 7.4 04/20/2017 1422   ALBUMIN 4.4 10/10/2018 1347   ALBUMIN 3.7 04/20/2017 1422   AST 15 01/16/2019 1529   AST 32 04/20/2017 1422   ALT 17 01/16/2019 1529   ALT 32 (H) 04/21/2017 1120   ALT 35 04/20/2017 1422   ALKPHOS 61 10/10/2018 1347   ALKPHOS 81 04/20/2017 1422   BILITOT 0.7 01/16/2019 1529   BILITOT 0.5 10/10/2018 1347   BILITOT 0.78 04/20/2017 1422   BILIDIR 0.2 01/09/2019 0924   IBILI 0.7 01/09/2019 0924      Component Value Date/Time   TSH 3.15 01/09/2019 0924   TSH 2.21 07/08/2018 0925   TSH 1.53 10/21/2017 1250      I, Trixie Dredge, am acting as Location manager for [employer], DO  I have reviewed the above documentation for accuracy and completeness, and  I agree with the above. -Jearld Lesch, DO

## 2019-05-19 ENCOUNTER — Other Ambulatory Visit: Payer: Self-pay | Admitting: Internal Medicine

## 2019-06-12 ENCOUNTER — Ambulatory Visit: Payer: BC Managed Care – PPO | Admitting: Podiatry

## 2019-06-12 ENCOUNTER — Encounter: Payer: Self-pay | Admitting: Podiatry

## 2019-06-12 ENCOUNTER — Other Ambulatory Visit: Payer: Self-pay

## 2019-06-12 DIAGNOSIS — M779 Enthesopathy, unspecified: Secondary | ICD-10-CM

## 2019-06-12 DIAGNOSIS — L84 Corns and callosities: Secondary | ICD-10-CM

## 2019-06-14 NOTE — Progress Notes (Signed)
Subjective:   Patient ID: Tara [name], female   DOB: 63 y.o.   MRN: [id number]   HPI Patient presents stating that she is developed a lot of inflammation underneath her right foot and it is been sore and the left one is the one we have treated mostly in the past   ROS      Objective:  Physical Exam  Neurovascular status intact with patient found to have inflammation sub-first MPJ right with fluid buildup keratotic tissue formation     Assessment:  Inflammatory capsulitis of the first MPJ right with lesion formation     Plan:  H&P done sterile prep done and today I went ahead and did a sub-first MPJ with 3 mg Dexasone Kenalog 5 mg Xylocaine and did deep debridement of the lesion.  Patient tolerated well be seen back to recheck

## 2019-06-16 ENCOUNTER — Other Ambulatory Visit: Payer: Self-pay | Admitting: Internal Medicine

## 2019-07-04 ENCOUNTER — Other Ambulatory Visit: Payer: Self-pay | Admitting: Internal Medicine

## 2019-07-11 ENCOUNTER — Telehealth: Payer: Self-pay | Admitting: Internal Medicine

## 2019-07-11 ENCOUNTER — Other Ambulatory Visit: Payer: Self-pay | Admitting: Internal Medicine

## 2019-07-11 NOTE — Telephone Encounter (Signed)
Tara [name] [id number]  [place]  gabapentin (NEURONTIN) 300 MG capsule   Emeli called to say she needs a refill on her above medication, she stated the pharmacy told her to call us.

## 2019-07-20 ENCOUNTER — Other Ambulatory Visit: Payer: Self-pay

## 2019-07-20 ENCOUNTER — Other Ambulatory Visit: Payer: BC Managed Care – PPO | Admitting: Internal Medicine

## 2019-07-20 DIAGNOSIS — E782 Mixed hyperlipidemia: Secondary | ICD-10-CM

## 2019-07-20 DIAGNOSIS — E119 Type 2 diabetes mellitus without complications: Secondary | ICD-10-CM

## 2019-07-20 DIAGNOSIS — E039 Hypothyroidism, unspecified: Secondary | ICD-10-CM

## 2019-07-21 ENCOUNTER — Ambulatory Visit (INDEPENDENT_AMBULATORY_CARE_PROVIDER_SITE_OTHER): Payer: BC Managed Care – PPO | Admitting: Internal Medicine

## 2019-07-21 ENCOUNTER — Encounter: Payer: Self-pay | Admitting: Internal Medicine

## 2019-07-21 VITALS — BP 110/80 | HR 83 | Temp 98.0°F | Ht 67.0 in | Wt 225.0 lb

## 2019-07-21 DIAGNOSIS — R7302 Impaired glucose tolerance (oral): Secondary | ICD-10-CM | POA: Diagnosis not present

## 2019-07-21 DIAGNOSIS — F419 Anxiety disorder, unspecified: Secondary | ICD-10-CM

## 2019-07-21 DIAGNOSIS — F329 Major depressive disorder, single episode, unspecified: Secondary | ICD-10-CM

## 2019-07-21 DIAGNOSIS — E782 Mixed hyperlipidemia: Secondary | ICD-10-CM | POA: Diagnosis not present

## 2019-07-21 DIAGNOSIS — F32A Depression, unspecified: Secondary | ICD-10-CM

## 2019-07-21 DIAGNOSIS — Z6835 Body mass index (BMI) 35.0-35.9, adult: Secondary | ICD-10-CM

## 2019-07-21 LAB — HEMOGLOBIN A1C
Hgb A1c MFr Bld: 5.8 % of total Hgb — ABNORMAL HIGH (ref <5.7)
Mean Plasma Glucose: 120 (calc)
eAG (mmol/L): 6.6 (calc)

## 2019-07-21 LAB — TSH: TSH: 2.72 mIU/L (ref 0.40–4.50)

## 2019-07-21 LAB — CBC WITH DIFFERENTIAL/PLATELET
Absolute Monocytes: 546 cells/uL (ref 200–950)
Basophils Absolute: 78 cells/uL (ref 0–200)
Basophils Relative: 1.2 %
Eosinophils Absolute: 488 cells/uL (ref 15–500)
Eosinophils Relative: 7.5 %
HCT: 39.2 % (ref 35.0–45.0)
Hemoglobin: 13.4 g/dL (ref 11.7–15.5)
Lymphs Abs: 1164 cells/uL (ref 850–3900)
MCH: 32.3 pg (ref 27.0–33.0)
MCHC: 34.2 g/dL (ref 32.0–36.0)
MCV: 94.5 fL (ref 80.0–100.0)
MPV: 11.9 fL (ref 7.5–12.5)
Monocytes Relative: 8.4 %
Neutro Abs: 4225 cells/uL (ref 1500–7800)
Neutrophils Relative %: 65 %
Platelets: 208 10*3/uL (ref 140–400)
RBC: 4.15 10*6/uL (ref 3.80–5.10)
RDW: 13.1 % (ref 11.0–15.0)
Total Lymphocyte: 17.9 %
WBC: 6.5 10*3/uL (ref 3.8–10.8)

## 2019-07-21 LAB — LIPID PANEL
Cholesterol: 242 mg/dL — ABNORMAL HIGH (ref <200)
HDL: 47 mg/dL — ABNORMAL LOW (ref >=50)
LDL Cholesterol (Calc): 150 mg/dL (calc) — ABNORMAL HIGH
Non-HDL Cholesterol (Calc): 195 mg/dL (calc) — ABNORMAL HIGH (ref <130)
Total CHOL/HDL Ratio: 5.1 (calc) — ABNORMAL HIGH (ref <5.0)
Triglycerides: 293 mg/dL — ABNORMAL HIGH (ref <150)

## 2019-07-21 MED ORDER — DULOXETINE HCL 30 MG PO CPEP: 30.0000 mg | ORAL_CAPSULE | Freq: Every day | ORAL | 1 refills | Status: DC

## 2019-07-21 NOTE — Patient Instructions (Signed)
It was a pleasure to see you today.  Start Crestor 5 mg 3 times weekly.  Follow-up in March.  Taper off of Cymbalta.  Try to walk daily.  Watch diet.

## 2019-07-21 NOTE — Progress Notes (Signed)
   Subjective:    Patient ID: Tara [name], female    DOB: [birth year], 63 y.o.   MRN: [id number]  HPI 63 year old Female in today for 87-month recheck.  Her lab parathoracic pain has completely resolved and she would like to taper off of Cymbalta.  She will do this over the next several weeks.  Her dentist thought she had thrush.  I have checked her mouth today and do not see any evidence of thrush.  History of obesity hypothyroidism allergic rhinitis asthma status post bilateral hip replacement.  History of dependent edema.  Remote history of breast cancer.  Doing well during the pandemic.  She is a Agricultural engineer.  Does not smoke.  History of impaired glucose tolerance.  Not getting much exercise.  Does not want to go to the gym.  Recommended just walking on a daily basis for exercise.  Has had flu vaccine.  She has a remote history of TTP and request a CBC which is entirely normal.  History of mixed hyperlipidemia longstanding.  Currently total cholesterol was 242, HDL 47, triglycerides 293 and LDL cholesterol is 150.  She will start Crestor 5 mg 3 times a week.  She will follow-up in early March.  Her hemoglobin A1c is 5.8% and stable from May.  Her physical exam is due after Jan 16, 2020.      Review of Systems no new complaints     Objective:   Physical Exam Blood pressure 110/80 pulse 83 temperature 98 degrees pulse oximetry 96% weight 225 pounds BMI 35.24 and warm and dry.  Nodes none.  Neck is supple without JVD thyromegaly or carotid bruits.  Pharynx is clear.  No evidence of thrush.  Chest is clear to auscultation without rales or wheezing.  Cardiac exam regular rate and rhythm normal S1 and S2.  Lower extremities: No lower extremity pitting edema.  She is alert and oriented.  Judgment affect and thought process are normal.       Assessment & Plan:  BMI 35.24-would like patient to be walking daily  Impaired glucose tolerance-watch calories-walks daily  Mixed  hyperlipidemia and low HDL cholesterol-start Crestor 5 mg 3 times weekly and follow-up in March  Anxiety depression-she is tapering off of Cymbalta and will see how  History of left parathoracic pain-resolved  Plan: See above and follow-up.  March with lipid panel liver functions and office visit regarding statin therapy.  25 minutes spent with patient including time reviewing records, interviewing and reviewing lab studies

## 2019-07-24 ENCOUNTER — Other Ambulatory Visit: Payer: Self-pay

## 2019-07-24 MED ORDER — ROSUVASTATIN CALCIUM 5 MG PO TABS: ORAL_TABLET | ORAL | 1 refills | Status: DC

## 2019-07-26 ENCOUNTER — Other Ambulatory Visit: Payer: Self-pay | Admitting: Internal Medicine

## 2019-07-31 ENCOUNTER — Other Ambulatory Visit: Payer: Self-pay | Admitting: Internal Medicine

## 2019-08-04 ENCOUNTER — Other Ambulatory Visit (INDEPENDENT_AMBULATORY_CARE_PROVIDER_SITE_OTHER): Payer: Self-pay | Admitting: Bariatrics

## 2019-08-04 DIAGNOSIS — F3289 Other specified depressive episodes: Secondary | ICD-10-CM

## 2019-08-13 ENCOUNTER — Other Ambulatory Visit: Payer: Self-pay | Admitting: Internal Medicine

## 2019-09-11 ENCOUNTER — Other Ambulatory Visit: Payer: Self-pay | Admitting: Internal Medicine

## 2019-10-30 ENCOUNTER — Other Ambulatory Visit: Payer: Self-pay

## 2019-10-30 ENCOUNTER — Other Ambulatory Visit: Payer: Self-pay | Admitting: Internal Medicine

## 2019-10-30 ENCOUNTER — Telehealth: Payer: Self-pay | Admitting: Internal Medicine

## 2019-10-30 NOTE — Telephone Encounter (Signed)
Tara [name] [phone]  Tara [name] came to get her labs this morning, while prescreening her she was really coughing. I ask her about cough and she said she always has a cough but recently it had gotten worse, where if she talks she gets winded and loses her voice. She also stated that her inhalers are not helping. She got her first vaccine 09/13/2019 and second 10/11/19.

## 2019-10-30 NOTE — Telephone Encounter (Signed)
After speaking with Dr Renold Genta we are going to reschedule appointments for next week.

## 2019-10-30 NOTE — Telephone Encounter (Signed)
LVM for patient to call back and reschedule those appointments.

## 2019-10-30 NOTE — Telephone Encounter (Signed)
Patient called back we rescheduled appointments for next week.

## 2019-11-03 ENCOUNTER — Ambulatory Visit: Payer: BC Managed Care – PPO | Admitting: Internal Medicine

## 2019-11-07 ENCOUNTER — Other Ambulatory Visit: Payer: Medicare Other | Admitting: Internal Medicine

## 2019-11-07 ENCOUNTER — Other Ambulatory Visit: Payer: Self-pay

## 2019-11-08 LAB — HEPATIC FUNCTION PANEL
AG Ratio: 1.4 (calc) (ref 1.0–2.5)
ALT: 13 U/L (ref 6–29)
AST: 13 U/L (ref 10–35)
Albumin: 4.3 g/dL (ref 3.6–5.1)
Alkaline phosphatase (APISO): 45 U/L (ref 37–153)
Bilirubin, Direct: 0.2 mg/dL (ref 0.0–0.2)
Globulin: 3 g/dL (calc) (ref 1.9–3.7)
Indirect Bilirubin: 0.7 mg/dL (calc) (ref 0.2–1.2)
Total Bilirubin: 0.9 mg/dL (ref 0.2–1.2)
Total Protein: 7.3 g/dL (ref 6.1–8.1)

## 2019-11-08 LAB — LIPID PANEL
Cholesterol: 201 mg/dL — ABNORMAL HIGH (ref <200)
HDL: 58 mg/dL (ref >=50)
LDL Cholesterol (Calc): 113 mg/dL (calc) — ABNORMAL HIGH
Non-HDL Cholesterol (Calc): 143 mg/dL (calc) — ABNORMAL HIGH (ref <130)
Total CHOL/HDL Ratio: 3.5 (calc) (ref <5.0)
Triglycerides: 180 mg/dL — ABNORMAL HIGH (ref <150)

## 2019-11-08 LAB — HEMOGLOBIN A1C
Hgb A1c MFr Bld: 6.1 % of total Hgb — ABNORMAL HIGH (ref <5.7)
Mean Plasma Glucose: 128 (calc)
eAG (mmol/L): 7.1 (calc)

## 2019-11-10 ENCOUNTER — Ambulatory Visit (INDEPENDENT_AMBULATORY_CARE_PROVIDER_SITE_OTHER): Payer: Medicare Other | Admitting: Internal Medicine

## 2019-11-10 ENCOUNTER — Encounter: Payer: Self-pay | Admitting: Internal Medicine

## 2019-11-10 ENCOUNTER — Other Ambulatory Visit: Payer: Self-pay

## 2019-11-10 VITALS — BP 110/70 | HR 77 | Temp 98.0°F | Ht 67.0 in | Wt 232.0 lb

## 2019-11-10 DIAGNOSIS — F32A Depression, unspecified: Secondary | ICD-10-CM

## 2019-11-10 DIAGNOSIS — R7302 Impaired glucose tolerance (oral): Secondary | ICD-10-CM | POA: Diagnosis not present

## 2019-11-10 DIAGNOSIS — F419 Anxiety disorder, unspecified: Secondary | ICD-10-CM | POA: Diagnosis not present

## 2019-11-10 DIAGNOSIS — F329 Major depressive disorder, single episode, unspecified: Secondary | ICD-10-CM

## 2019-11-10 DIAGNOSIS — E782 Mixed hyperlipidemia: Secondary | ICD-10-CM | POA: Diagnosis not present

## 2019-11-10 DIAGNOSIS — E039 Hypothyroidism, unspecified: Secondary | ICD-10-CM | POA: Diagnosis not present

## 2019-11-10 DIAGNOSIS — M1990 Unspecified osteoarthritis, unspecified site: Secondary | ICD-10-CM | POA: Insufficient documentation

## 2019-11-10 DIAGNOSIS — J302 Other seasonal allergic rhinitis: Secondary | ICD-10-CM

## 2019-11-10 NOTE — Progress Notes (Signed)
   Subjective:    Patient ID: Tara [name], female    DOB: 02-17-1956, 64 y.o.   MRN: [id number]  HPI   64 year old Female for follow up on multiple medical issues.  Is on Singular and Flonase nasal spray for allergic rhinitis.  Has not seen allergist in some time and will be referred for evaluation.  History of morbid obesity and dependent edema.  History of hypothyroidism.  History of hyperlipidemia treated with Crestor 3 times a week.  Is on Cymbalta for chronic musculoskeletal pain and depression.  History of impaired glucose tolerance and is on Metformin.  Is on Wellbutrin for anxiety and depression.  Is also on Cymbalta.  Is on torsemide for dependent edema.  Hemoglobin A1c stable at 6.1% on current regimen.  Triglycerides have decreased from 293 4 months ago 180.  LDL cholesterol has decreased from 1 50-1 13.  HDL is stable at 58.  TSH was checked in November and is within normal limits.  Review of Systems  Cardiovascular: Negative.   Gastrointestinal: Negative.   Musculoskeletal: Negative.   Psychiatric/Behavioral:       Anxiety and depression  More allergy symptoms recently-will refer to allergist     Objective:   Physical Exam  Blood pressure 110/70 pulse 77 temperature 98 degrees orally weight 232 pounds BMI 36.34.  Skin warm and dry.  Nodes none.  Neck is supple.  Chest clear to auscultation.  Cardiac exam regular rate and rhythm normal S1 and S2.  Extremities without pitting edema.      Assessment & Plan:  Allergic rhinitis-will be referred to allergist for allergy evaluation  Morbid obesity-continue to work on diet exercise and weight loss.  She really did not like going to healthy weight clinic.  Hyperlipidemia-stable on Crestor  Anxiety and depression treated with Wellbutrin and Cymbalta  Hypothyroidism stable on thyroid replacement medication  Impaired glucose tolerance-hemoglobin A1c 6.1% and stable on Metformin.  Plan: Allergy evaluation requested.   Continue current medications.  Schedule for health maintenance exam in May 2021.

## 2019-11-13 ENCOUNTER — Other Ambulatory Visit: Payer: Self-pay | Admitting: Internal Medicine

## 2019-11-26 NOTE — Patient Instructions (Addendum)
Patient has rescheduled appointment. Not seen today. She cancelled appt.

## 2019-11-26 NOTE — Progress Notes (Signed)
Appointment was cancelled by patient.

## 2019-11-27 NOTE — Progress Notes (Signed)
Appointment was canceled patient was having COVID symptoms, so rescheduled appointment.

## 2019-11-28 ENCOUNTER — Ambulatory Visit (INDEPENDENT_AMBULATORY_CARE_PROVIDER_SITE_OTHER): Payer: Medicare Other | Admitting: Pediatrics

## 2019-11-28 ENCOUNTER — Telehealth: Payer: Self-pay

## 2019-11-28 ENCOUNTER — Other Ambulatory Visit: Payer: Self-pay

## 2019-11-28 ENCOUNTER — Encounter: Payer: Self-pay | Admitting: Pediatrics

## 2019-11-28 VITALS — BP 130/78 | HR 102 | Temp 97.8°F | Resp 24 | Ht 66.7 in | Wt 239.4 lb

## 2019-11-28 DIAGNOSIS — R7303 Prediabetes: Secondary | ICD-10-CM | POA: Diagnosis not present

## 2019-11-28 DIAGNOSIS — Z862 Personal history of diseases of the blood and blood-forming organs and certain disorders involving the immune mechanism: Secondary | ICD-10-CM | POA: Diagnosis not present

## 2019-11-28 DIAGNOSIS — Z8739 Personal history of other diseases of the musculoskeletal system and connective tissue: Secondary | ICD-10-CM

## 2019-11-28 DIAGNOSIS — J4540 Moderate persistent asthma, uncomplicated: Secondary | ICD-10-CM | POA: Diagnosis not present

## 2019-11-28 DIAGNOSIS — Z853 Personal history of malignant neoplasm of breast: Secondary | ICD-10-CM

## 2019-11-28 DIAGNOSIS — J3089 Other allergic rhinitis: Secondary | ICD-10-CM | POA: Diagnosis not present

## 2019-11-28 DIAGNOSIS — L2089 Other atopic dermatitis: Secondary | ICD-10-CM

## 2019-11-28 DIAGNOSIS — K219 Gastro-esophageal reflux disease without esophagitis: Secondary | ICD-10-CM

## 2019-11-28 DIAGNOSIS — Z8619 Personal history of other infectious and parasitic diseases: Secondary | ICD-10-CM

## 2019-11-28 DIAGNOSIS — I890 Lymphedema, not elsewhere classified: Secondary | ICD-10-CM

## 2019-11-28 DIAGNOSIS — Z8679 Personal history of other diseases of the circulatory system: Secondary | ICD-10-CM

## 2019-11-28 MED ORDER — OMEPRAZOLE 20 MG PO CPDR: 20.0000 mg | DELAYED_RELEASE_CAPSULE | Freq: Every day | ORAL | 5 refills | Status: DC

## 2019-11-28 MED ORDER — FLUTICASONE-SALMETEROL 250-50 MCG/DOSE IN AEPB: 1.0000 | INHALATION_SPRAY | Freq: Two times a day (BID) | RESPIRATORY_TRACT | 5 refills | Status: DC

## 2019-11-28 MED ORDER — FLUTICASONE PROPIONATE 50 MCG/ACT NA SUSP: NASAL | 5 refills | Status: DC

## 2019-11-28 MED ORDER — ALBUTEROL SULFATE HFA 108 (90 BASE) MCG/ACT IN AERS: INHALATION_SPRAY | RESPIRATORY_TRACT | 1 refills | Status: DC

## 2019-11-28 MED ORDER — TRIAMCINOLONE ACETONIDE 0.1 % EX CREA: TOPICAL_CREAM | CUTANEOUS | 5 refills | Status: DC

## 2019-11-28 NOTE — Patient Instructions (Addendum)
Environmental control of dust and mold Claritin 10 mg-take 1 tablet once a day for runny nose for itchy eyes Nasal saline irrigations at night followed by fluticasone 2 sprays per nostril at night  Montelukast 10 mg-take 1 tablet once a day to prevent coughing or wheezing Advair Diskus 250-1 puff every 12 hours to prevent coughing or wheezing Proventil 2 puffs every 4 hours if needed for wheezing or coughing spells.  You may use Proventil 2 puffs 5 to 15 minutes before exercise Add prednisone 10 mg twice a day for 4 days, 10 mg on the fifth day to see if it helps the cough  Omeprazole 20 mg-take 1 capsule once a day for acid reflux Instructions for acid reflux given to the patient   Triamcinolone  0.1% cream-apply twice a day if needed to red itchy areas below the face  If you are not doing a lot better by next week, I recommend that you have a chest x-ray  Continue on  your other medications  Call us if you are not doing well on this treatment plan

## 2019-11-28 NOTE — Progress Notes (Signed)
[address] [place] [address] Dept: [phone]  New Patient Note  Patient ID: Tara [name], female    DOB: 03-Apr-1956  Age: 64 y.o. MRN: [id number] Date of Office Visit: 11/28/2019 Referring provider: [name], MD [address] [place],   [id number]    Chief Complaint: Asthma (cough x 2 years), Cough, and Wheezing  HPI Tara [name] presents for an allergy evaluation.  She has had asthma since her 60s.  She is on montelukast 10 mg once a day, Advair Diskus 250-1 puff twice a day and Proventil if needed.  During the past 2 years she has had a worsening of her cough.  She has aggravation of her symptoms on exposure to dust, cigarette smoking and weather changes.  She does have some shortness of breath with exercise.  She has had an irregular heartbeat but is currently well controlled.  She has a history of eczema since her 3s.  She had pneumonia once in 2013 with a normal subsequent chest x-ray.  She has not had any other episodes of pneumonia.  She has a history of osteoporosis.  She has mild gastroesophageal reflux treated with Tums.  During the past year she has been having a runny nose and a stuffy nose.  She does not have a history of sinus infections.  Review of Systems  Constitutional: Negative.   HENT:       Runny nose and sneezing for about a year  Eyes:       Needs lens implants  Respiratory:       Asthma since her 53s  Cardiovascular:       Irregular heartbeat and palpitations in the past  Gastrointestinal:       Occasional heartburn treated with Tums .  History of resolved hepatitis C  Genitourinary:       Left breast cancer in 2012 treated with surgery, chemotherapy and radiation.  Musculoskeletal:       Osteoarthritis.  Left hip replacement.  Left foot reconstruction.  Surgery in her left elbow.  Skin:       Eczema since her 67s  Neurological:       Neuropathy left foot  Endo/Heme/Allergies:       Prediabetes.  Low thyroid function.   Thrombotic thrombocytopenic  purpura.  Osteoporosis  Psychiatric/Behavioral: Negative.     Outpatient Encounter Medications as of 11/28/2019  Medication Sig  . acetaminophen (TYLENOL) 500 MG tablet Take 500 mg by mouth every 6 (six) hours as needed for mild pain, moderate pain or headache.   . albuterol (VENTOLIN HFA) 108 (90 Base) MCG/ACT inhaler Two puffs every 4 hours if needed for wheezing or coughing .  May use 2 puffs 5-15 minutes prior to exercise.  Marland Kitchen buPROPion (WELLBUTRIN SR) 200 MG 12 hr tablet Take 1 tablet (200 mg total) by mouth daily. (Patient taking differently: Take 200 mg by mouth daily as needed. )  . cyclobenzaprine (FLEXERIL) 10 MG tablet 10 mg daily as needed for muscle spasms.   . digoxin (LANOXIN) 0.25 MG tablet TAKE ONE TABLET BY MOUTH DAILY  . fluticasone (FLONASE) 50 MCG/ACT nasal spray Two sprays each nostril at night as needed for nasal congestion  . Fluticasone-Salmeterol (ADVAIR DISKUS) 250-50 MCG/DOSE AEPB Inhale 1 puff into the lungs every 12 (twelve) hours. To prevent cough or wheeze.  . gabapentin (NEURONTIN) 300 MG capsule TAKE ONE CAPSULE BY MOUTH TWICE A DAY  . glucosamine-chondroitin 500-400 MG tablet Take 1 tablet by mouth daily.   Marland Kitchen  KLOR-CON M20 20 MEQ tablet TAKE ONE TABLET BY MOUTH DAILY  . levothyroxine (SYNTHROID) 88 MCG tablet TAKE ONE TABLET BY MOUTH DAILY  . metFORMIN (GLUCOPHAGE) 500 MG tablet TAKE ONE TABLET BY MOUTH TWO TIMES A DAY WITH A MEAL  . montelukast (SINGULAIR) 10 MG tablet TAKE ONE TABLET BY MOUTH EVERY NIGHT AT BEDTIME  . Multiple Vitamin (MULTIVITAMIN WITH MINERALS) TABS tablet Take 1 tablet by mouth daily.  . rosuvastatin (CRESTOR) 5 MG tablet Take one tablet by mouth three times a week  . torsemide (DEMADEX) 20 MG tablet TAKE ONE TABLET BY MOUTH DAILY  . Vitamin D, Ergocalciferol, (DRISDOL) 1.25 MG (50000 UT) CAPS capsule Take 1 capsule (50,000 Units total) by mouth every 7 (seven) days.  . [DISCONTINUED] ADVAIR DISKUS 250-50  MCG/DOSE AEPB INHALE ONE PUFF BY MOUTH TWICE A DAY  . [DISCONTINUED] albuterol (PROVENTIL HFA;VENTOLIN HFA) 108 (90 BASE) MCG/ACT inhaler Inhale 2 puffs into the lungs every 6 (six) hours as needed for wheezing or shortness of breath.   . [DISCONTINUED] DULoxetine (CYMBALTA) 60 MG capsule Take by mouth.  . [DISCONTINUED] fluticasone (FLONASE) 50 MCG/ACT nasal spray Place 2 sprays into both nostrils daily as needed for rhinitis.  . LOTEMAX SM 0.38 % GEL   . magnesium hydroxide (MILK OF MAGNESIA) 400 MG/5ML suspension Take by mouth.  Marland Kitchen omeprazole (PRILOSEC) 20 MG capsule Take 1 capsule (20 mg total) by mouth daily. For reflux  . PROLENSA 0.07 % SOLN   . triamcinolone cream (KENALOG) 0.1 % Apply twice a day if needed to red itchy areas below the face.  . [DISCONTINUED] colesevelam (WELCHOL) 625 MG tablet Take by mouth.  . [DISCONTINUED] DULoxetine (CYMBALTA) 30 MG capsule Take 1 capsule (30 mg total) by mouth daily.  . [DISCONTINUED] magnesium oxide (MAG-OX) 400 MG tablet Take 400 mg by mouth daily.  . [DISCONTINUED] Vitamins/Minerals TABS Take by mouth.   Facility-Administered Encounter Medications as of 11/28/2019  Medication  . 0.9 %  sodium chloride infusion     Drug Allergies:  Allergies  Allergen Reactions  . Aspirin Other (See Comments)    Pt has a hx of TTP.   . Nsaids Other (See Comments)    Pt has a hx of TTP.     Family History: Onita's family history includes Alcohol abuse in her maternal grandfather; Brain cancer in her maternal grandfather; COPD in her father and mother; Colon polyps in her mother; Heart disease in her mother; Obesity in her father and mother; Pneumonia in her mother; Thyroid disease in her mother..  Family history is positive for asthma in her mother.  Family history is negative for hayfever, sinus problems, angioedema, eczema, hives, food allergies.  Social and environmental.  She is retired.  There is a dog in the home.  She is not exposed to cigarette  smoking.  She has never smoked cigarettes.  Physical Exam: BP 130/78 (BP Location: Right Arm, Patient Position: Sitting, Cuff Size: Large)   Pulse (!) 102   Temp 97.8 F (36.6 C) (Oral)   Resp (!) 24   Ht 5' 6.7" (1.694 m)   Wt 239 lb 6.7 oz (108.6 kg)   SpO2 98%   BMI 37.84 kg/m    Physical Exam Vitals reviewed.  Constitutional:      Appearance: Normal appearance. She is obese.  HENT:     Head:     Comments: Eyes normal.  Ears normal.  Nose normal.  Pharynx normal. Neck:     Comments: No thyromegaly  Cardiovascular:     Rate and Rhythm: Regular rhythm. Tachycardia present.     Comments: S1-S2 normal no murmurs Pulmonary:     Comments: Clear to percussion and auscultation Abdominal:     Palpations: Abdomen is soft.     Tenderness: There is no abdominal tenderness.     Comments: No hepatosplenomegaly  Musculoskeletal:     Cervical back: Neck supple.  Lymphadenopathy:     Cervical: No cervical adenopathy.  Skin:    Comments:  eczema left leg.  Lymphedema left arm  Neurological:     General: No focal deficit present.     Mental Status: She is alert and oriented to person, place, and time. Mental status is at baseline.  Psychiatric:        Mood and Affect: Mood normal.        Behavior: Behavior normal.        Thought Content: Thought content normal.        Judgment: Judgment normal.     Diagnostics: Allergy skin test were positive to maple pollen.  She had mild reactivity to some molds  FVC 2.85 L FEV1 2.36 L.  Predicted FVC 3.53 L predicted FEV1 2.70 L.  After albuterol 2 puffs FVC 3.06 L FEV1 2.48 L-the spirometry is in the normal range and there was no significant improvement after albuterol   Assessment  Assessment and Plan: 1. Moderate persistent asthma without complication   2. Other allergic rhinitis   3. Prediabetes   4. History of TTP (thrombotic thrombocytopenic purpura)   5. History of cardiac arrhythmia   6. Lymphedema of arm   7. Gastroesophageal  reflux disease without esophagitis   8. History of breast cancer   9. History of hepatitis C   10. History of osteoporosis   11. Flexural atopic dermatitis     Meds ordered this encounter  Medications  . fluticasone (FLONASE) 50 MCG/ACT nasal spray    Sig: Two sprays each nostril at night as needed for nasal congestion    Dispense:  16 g    Refill:  5  . Fluticasone-Salmeterol (ADVAIR DISKUS) 250-50 MCG/DOSE AEPB    Sig: Inhale 1 puff into the lungs every 12 (twelve) hours. To prevent cough or wheeze.    Dispense:  60 each    Refill:  5  . albuterol (VENTOLIN HFA) 108 (90 Base) MCG/ACT inhaler    Sig: Two puffs every 4 hours if needed for wheezing or coughing .  May use 2 puffs 5-15 minutes prior to exercise.    Dispense:  18 g    Refill:  1  . omeprazole (PRILOSEC) 20 MG capsule    Sig: Take 1 capsule (20 mg total) by mouth daily. For reflux    Dispense:  30 capsule    Refill:  5  . triamcinolone cream (KENALOG) 0.1 %    Sig: Apply twice a day if needed to red itchy areas below the face.    Dispense:  80 g    Refill:  5    Patient Instructions  Environmental control of dust and mold Claritin 10 mg-take 1 tablet once a day for runny nose for itchy eyes Nasal saline irrigations at night followed by fluticasone 2 sprays per nostril at night  Montelukast 10 mg-take 1 tablet once a day to prevent coughing or wheezing Advair Diskus 250-1 puff every 12 hours to prevent coughing or wheezing Proventil 2 puffs every 4 hours if needed for wheezing or coughing spells.  You  may use Proventil 2 puffs 5 to 15 minutes before exercise Add prednisone 10 mg twice a day for 4 days, 10 mg on the fifth day to see if it helps the cough  Omeprazole 20 mg-take 1 capsule once a day for acid reflux Instructions for acid reflux given to the patient   Triamcinolone  0.1% cream-apply twice a day if needed to red itchy areas below the face  If you are not doing a lot better by next week, I recommend  that you have a chest x-ray  Continue on  your other medications  Call us if you are not doing well on this treatment plan   Return in about 4 weeks (around 12/26/2019).   Thank you for the opportunity to care for this patient.  Please do not hesitate to contact me with questions.  Penne Lash, M.D.  [facility] [address] [place],  [address] [phone]

## 2019-11-28 NOTE — Telephone Encounter (Signed)
Per Dr. Shaune Leeks, call patient and make sure patient was given a prednisone dose pack before she left the clinic today.  This was the Prednisone 10 mg pack with # 9 tablets included. Please inform Dr. Shaune Leeks when patient calls back. If patient has not received the prednisone, let patient know we can call this into her pharmacy or she can come by the clinic to pick up a pack.    Per chart note:  prednisone 10 mg twice a day for 4 days, 10 mg on the fifth day to see if it helps the cough  Left message for patient to call clinic.

## 2019-11-29 NOTE — Patient Instructions (Addendum)
Had allergy evaluation in the near future.  Hemoglobin A1c is stable.  Has upcoming appointment for health maintenance exam and fasting labs in May.  Continue Crestor for hyperlipidemia.  Continue diet and exercise efforts.

## 2019-11-29 NOTE — Progress Notes (Signed)
This encounter was created in error - please disregard.

## 2019-11-29 NOTE — Telephone Encounter (Signed)
Left message on voice mail x 2 for patient to call clinic.

## 2019-11-29 NOTE — Addendum Note (Signed)
Addended by: [name] on: 11/29/2019 05:16 PM   Modules accepted: Level of Service, SmartSet

## 2019-11-30 ENCOUNTER — Encounter: Payer: Self-pay | Admitting: Oncology

## 2019-11-30 ENCOUNTER — Encounter: Payer: Self-pay | Admitting: Internal Medicine

## 2019-11-30 NOTE — Telephone Encounter (Signed)
Left message x 3 for patient to call clinic.   Will inform Dr. Shaune Leeks unable to contact patient.

## 2019-12-11 ENCOUNTER — Encounter: Payer: Self-pay | Admitting: Oncology

## 2019-12-11 ENCOUNTER — Other Ambulatory Visit: Payer: Self-pay | Admitting: Radiology

## 2020-01-06 ENCOUNTER — Other Ambulatory Visit: Payer: Self-pay | Admitting: Internal Medicine

## 2020-01-08 ENCOUNTER — Telehealth: Payer: Self-pay | Admitting: Internal Medicine

## 2020-01-08 NOTE — Telephone Encounter (Signed)
Pt wants to schedule an appt and said it can be virtual if your fine with it, Its for back pain, She said its upper left back, She said its happened before and kinda went away but said it started to come back and she doesn't want to wait until her next appt. Pt has tried a lot of things like ice packs and over the counter medicine, Please advise

## 2020-01-08 NOTE — Telephone Encounter (Signed)
Lvm to try and schedule

## 2020-01-08 NOTE — Telephone Encounter (Signed)
Thursday

## 2020-01-11 ENCOUNTER — Other Ambulatory Visit: Payer: Self-pay

## 2020-01-11 ENCOUNTER — Encounter: Payer: Self-pay | Admitting: Internal Medicine

## 2020-01-11 ENCOUNTER — Ambulatory Visit (INDEPENDENT_AMBULATORY_CARE_PROVIDER_SITE_OTHER): Payer: Medicare Other | Admitting: Internal Medicine

## 2020-01-11 VITALS — BP 110/80 | HR 106 | Temp 96.9°F | Ht 67.5 in | Wt 227.0 lb

## 2020-01-11 DIAGNOSIS — M5414 Radiculopathy, thoracic region: Secondary | ICD-10-CM | POA: Diagnosis not present

## 2020-01-11 NOTE — Progress Notes (Signed)
   Subjective:    Patient ID: Tara [name], female    DOB: 08-06-1956, 64 y.o.   MRN: [id number]  HPI History of left thoracic pain thought to be impingement of nerve between T 8 and T 9.  She is seen by Dr. Delman Kitten with [facility] but his office is in [place].  This is near her home.  In just a few days, she is going to [place], [place] and is concerned she might develop pain on her trip.    However, apparently in late April she began to have pain again and on April 22 she had fluoroscopically guided epidural steroid injection transforaminal left TTN by Dr. Ermalene Postin. She is asking for narcotic pain medication to take on her trip to [facility] and I have given her a small quantity of hydrocodone/APAP for that reason.  This is the reason for the office visit today she says.  She has a history of anxiety.  She worries a lot.  Apparently surgery is an option to correct the problem but she does not want to have surgery.  She did not really get any relief with the last injection she had in late April she says.  According to Dr. Conrad  records myelogram showed potential impingement of the nerve exiting between T9 and T10 on the left side.  Initially epidural steroids helped a good deal with benefit between 6 and 8 months at a time only to have pain return for unknown reasons.  Review of Systems see above     Objective:   Physical Exam  She has point tenderness in her left parathoracic area posteriorly.  Otherwise it is really not bothering her very much at the present time      Assessment & Plan:  History of thoracic radiculopathy-did not respond to last epidural steroid injection apparently  Plan: Norco 5/325 ( #15  Tablets) 1 p.o. every 8 hours as needed pain with no refill.  Addendum: Patient apparently called Dr. Ermalene Postin and canceled her appointment in May for epidural steroid injection.  Time spent interviewing patient, reviewing Dr. Loraine Leriche records, medical  decision making and examining patient as well as sending electronically narcotic prescription is 20 minutes

## 2020-01-12 ENCOUNTER — Telehealth: Payer: Self-pay | Admitting: Internal Medicine

## 2020-01-12 MED ORDER — HYDROCODONE-ACETAMINOPHEN 5-325 MG PO TABS: 1.0000 | ORAL_TABLET | Freq: Three times a day (TID) | ORAL | 0 refills | Status: DC | PRN

## 2020-01-12 NOTE — Telephone Encounter (Signed)
Pt calling about appt yesterday and said she thought a prescription for pain was going to be sent in to [employer] [facility] at [facility], please advise. She said she may have miss understood but wasn't sure

## 2020-01-12 NOTE — Telephone Encounter (Signed)
Pt was notified of results and instructions, pt verbalized understanding.   

## 2020-01-12 NOTE — Telephone Encounter (Signed)
It was pended yesterday and I forgot to sign. Please call her. Should be ready soon.

## 2020-01-15 ENCOUNTER — Ambulatory Visit (HOSPITAL_BASED_OUTPATIENT_CLINIC_OR_DEPARTMENT_OTHER)
Admission: RE | Admit: 2020-01-15 | Discharge: 2020-01-15 | Disposition: A | Discharge status: Home / Self Care | Payer: Medicare Other | Source: Ambulatory Visit | Attending: Family | Admitting: Family

## 2020-01-15 ENCOUNTER — Ambulatory Visit (INDEPENDENT_AMBULATORY_CARE_PROVIDER_SITE_OTHER): Payer: Medicare Other | Admitting: Pediatrics

## 2020-01-15 ENCOUNTER — Other Ambulatory Visit: Payer: Medicare Other | Admitting: Internal Medicine

## 2020-01-15 ENCOUNTER — Encounter: Payer: Self-pay | Admitting: Pediatrics

## 2020-01-15 ENCOUNTER — Other Ambulatory Visit: Payer: Self-pay

## 2020-01-15 VITALS — BP 102/74 | HR 100 | Temp 96.5°F | Resp 18

## 2020-01-15 DIAGNOSIS — Z8679 Personal history of other diseases of the circulatory system: Secondary | ICD-10-CM

## 2020-01-15 DIAGNOSIS — Z8739 Personal history of other diseases of the musculoskeletal system and connective tissue: Secondary | ICD-10-CM | POA: Insufficient documentation

## 2020-01-15 DIAGNOSIS — F32A Depression, unspecified: Secondary | ICD-10-CM

## 2020-01-15 DIAGNOSIS — L2089 Other atopic dermatitis: Secondary | ICD-10-CM

## 2020-01-15 DIAGNOSIS — Z853 Personal history of malignant neoplasm of breast: Secondary | ICD-10-CM | POA: Insufficient documentation

## 2020-01-15 DIAGNOSIS — R059 Cough, unspecified: Secondary | ICD-10-CM | POA: Insufficient documentation

## 2020-01-15 DIAGNOSIS — E782 Mixed hyperlipidemia: Secondary | ICD-10-CM

## 2020-01-15 DIAGNOSIS — E039 Hypothyroidism, unspecified: Secondary | ICD-10-CM

## 2020-01-15 DIAGNOSIS — J3089 Other allergic rhinitis: Secondary | ICD-10-CM | POA: Diagnosis not present

## 2020-01-15 DIAGNOSIS — R05 Cough: Secondary | ICD-10-CM | POA: Diagnosis not present

## 2020-01-15 DIAGNOSIS — J302 Other seasonal allergic rhinitis: Secondary | ICD-10-CM

## 2020-01-15 DIAGNOSIS — Z8619 Personal history of other infectious and parasitic diseases: Secondary | ICD-10-CM

## 2020-01-15 DIAGNOSIS — J4540 Moderate persistent asthma, uncomplicated: Secondary | ICD-10-CM

## 2020-01-15 DIAGNOSIS — Z0000 Encounter for general adult medical examination without abnormal findings: Secondary | ICD-10-CM

## 2020-01-15 DIAGNOSIS — K219 Gastro-esophageal reflux disease without esophagitis: Secondary | ICD-10-CM

## 2020-01-15 DIAGNOSIS — L209 Atopic dermatitis, unspecified: Secondary | ICD-10-CM | POA: Insufficient documentation

## 2020-01-15 DIAGNOSIS — L2084 Intrinsic (allergic) eczema: Secondary | ICD-10-CM | POA: Insufficient documentation

## 2020-01-15 DIAGNOSIS — R7302 Impaired glucose tolerance (oral): Secondary | ICD-10-CM

## 2020-01-15 DIAGNOSIS — F419 Anxiety disorder, unspecified: Secondary | ICD-10-CM

## 2020-01-15 HISTORY — DX: Personal history of other diseases of the musculoskeletal system and connective tissue: Z87.39

## 2020-01-15 HISTORY — DX: Personal history of other diseases of the circulatory system: Z86.79

## 2020-01-15 HISTORY — DX: Intrinsic (allergic) eczema: L20.84

## 2020-01-15 HISTORY — DX: Personal history of malignant neoplasm of breast: Z85.3

## 2020-01-15 HISTORY — DX: Moderate persistent asthma, uncomplicated: J45.40

## 2020-01-15 HISTORY — DX: Personal history of other infectious and parasitic diseases: Z86.19

## 2020-01-15 MED ORDER — OMEPRAZOLE 20 MG PO CPDR: DELAYED_RELEASE_CAPSULE | ORAL | 2 refills | Status: DC

## 2020-01-15 MED ORDER — OMEPRAZOLE 20 MG PO TBEC: 20.0000 mg | DELAYED_RELEASE_TABLET | Freq: Two times a day (BID) | ORAL | 5 refills | Status: DC

## 2020-01-15 NOTE — Patient Instructions (Addendum)
Asthma Continue montelukast 10 mg-take 1 tablet once a day to help prevent cough and wheeze. Continue Advair 250 take 1 puff every 12 hours to help prevent cough and wheeze. May use albuterol 2 puffs every 4 hours as needed for coughing, wheezing, tightness in chest, or shortness of breath.  Also, may use albuterol 2 puffs 5 to 15 minutes prior to exercise  Allergic rhinitis Continue fluticasone nasal spray using 2 sprays each nostril once a day as needed for stuffy nose. May use saline nasal spray or or saline nasal lavage as needed for nasal symptoms.  Please use this prior to any medicated nasal sprays.  Reflux Start omeprazole 20 mg 1 capsule twice a day for reflux  Atopic dermatitis Continue triamcinolone 0.1% cream - apply twice a day to red/itchy areas below the face.  Obtain chest x-ray due to cough. Refer to ear nose and throat due to raspy voice and cough.  Please let us know if this treatment plan is not working well for you. Schedule follow-up appointment in 2 months.

## 2020-01-15 NOTE — Progress Notes (Signed)
[address] [place] [address] Dept: [phone]  FOLLOW UP NOTE  Patient ID: Tara [name], female    DOB: 04-14-1956  Age: 64 y.o. MRN: [id number] Date of Office Visit: 01/15/2020  Assessment  Chief Complaint: Cough  HPI Tara [name] is a 64 year old female that presents for follow-up of cough, moderate persistent asthma, and allergic rhinitis.  She was last seen on November 28, 2019 by Dr. Shaune Leeks.  Cough is reported as 20% better.  She reports her cough has been going on a long time, maybe a couple of years.  She continues to take Advair Diskus 250/50-1 puff twice a day and montelukast 10 mg once a day.  Her cough is described as dry and occurs more at night.  She thinks that her last chest x-ray was maybe 4 years ago.  She reports occasional wheezing and denies any tightness in her chest or shortness of breath.  She has not used her albuterol inhaler any since her last office visit.  She also has not required any trips to the emergency room or hospital since we last saw her.   She reports occasional heartburn, globulus sensation in her throat, and raspy voice that occurs every couple days.  She feels that the globulus sensation is what causes her to cough most of the time.  She has not seen an ear nose and throat doctor for her cough.  She would like to speak with her husband first about which ENT she should see before scheduling any appointments.  She stopped taking the omeprazole that was given at the last office visit because she did not feel like it was needed and did not want to take medication every day.  Allergic rhinitis is reported as moderately controlled with the use of fluticasone nasal spray and Claritin as needed.  She reports occasional nasal congestion and sneeze and denies any runny nose or postnasal drip.  She also denies any ocular pruritus.  Atopic dermatitis is reported as controlled with the use of triamcinolone cream.   Drug Allergies:  Allergies    Allergen Reactions  . Aspirin Other (See Comments)    Pt has a hx of TTP.   . Nsaids Other (See Comments)    Pt has a hx of TTP.     Physical Exam: BP 102/74   Pulse 100   Temp (!) 96.5 F (35.8 C) (Temporal)   Resp 18   SpO2 96%    Physical Exam Constitutional:      Appearance: Normal appearance.  HENT:     Head: Normocephalic and atraumatic.     Comments: Pharynx: slightly erythematous. Eyes normal. Ears: unable to visualize right tympanic membrane. Left ear normal. Nose normal.     Right Ear: External ear normal.     Left Ear: External ear normal.     Nose: Nose normal.     Mouth/Throat:     Mouth: Mucous membranes are moist.  Eyes:     Conjunctiva/sclera: Conjunctivae normal.  Cardiovascular:     Rate and Rhythm: Rhythm irregular.     Heart sounds: Normal heart sounds.  Pulmonary:     Effort: Pulmonary effort is normal.     Breath sounds: Normal breath sounds.     Comments: Lungs clear to auscultation. Skin:    General: Skin is warm and dry.     Comments: Dry patch noted on left lower leg  Neurological:     General: No focal deficit present.  Mental Status: She is alert and oriented to person, place, and time.  Psychiatric:        Mood and Affect: Mood normal.        Behavior: Behavior normal.        Thought Content: Thought content normal.        Judgment: Judgment normal.     Diagnostics: FVC 2.90 L, FEV1 2.37 L.  Predicted FVC 3.53 L, FEV1 2.70 L.  Spirometry indicates normal ventilatory function.  Assessment and Plan: 1. Moderate persistent asthma without complication   2. Cough   3. Gastroesophageal reflux disease without esophagitis   4. Other allergic rhinitis   5. Flexural atopic dermatitis     Meds ordered this encounter  Medications  . Omeprazole 20 MG TBEC    Sig: Take 1 tablet (20 mg total) by mouth 2 (two) times daily.    Dispense:  30 tablet    Refill:  5  . omeprazole (PRILOSEC) 20 MG capsule    Sig: Take one capsule twice a  day to help prevent reflux    Dispense:  60 capsule    Refill:  2    Patient Instructions  Asthma Continue montelukast 10 mg-take 1 tablet once a day to help prevent cough and wheeze. Continue Advair 250 take 1 puff every 12 hours to help prevent cough and wheeze. May use albuterol 2 puffs every 4 hours as needed for coughing, wheezing, tightness in chest, or shortness of breath.  Also, may use albuterol 2 puffs 5 to 15 minutes prior to exercise  Allergic rhinitis Continue fluticasone nasal spray using 2 sprays each nostril once a day as needed for stuffy nose. May use saline nasal spray or or saline nasal lavage as needed for nasal symptoms.  Please use this prior to any medicated nasal sprays.  Reflux Start omeprazole 20 mg 1 capsule twice a day for reflux  Atopic dermatitis Continue triamcinolone 0.1% cream - apply twice a day to red/itchy areas below the face.  Obtain chest x-ray due to cough. Refer to ear nose and throat due to raspy voice and cough.  Please let us know if this treatment plan is not working well for you. Schedule follow-up appointment in 2 months.   Return in about 2 months (around 03/16/2020).   Thank you for the opportunity to care for this patient.  Please do not hesitate to contact me with any questions.  Althea Charon, FNP [facility]   I have provided oversight concerning Enis Gash' evaluation and treatment of this patient's health issues addressed during today's encounter. I agree with the assessment and therapeutic plan as outlined in the note.   Thank you for the opportunity to care for this patient.  Please do not hesitate to contact me with questions.  Penne Lash, M.D.  [facility] [address] [place], [place] [address] [phone]

## 2020-01-16 LAB — COMPLETE METABOLIC PANEL WITH GFR
AG Ratio: 1.3 (calc) (ref 1.0–2.5)
ALT: 15 U/L (ref 6–29)
AST: 14 U/L (ref 10–35)
Albumin: 4.2 g/dL (ref 3.6–5.1)
Alkaline phosphatase (APISO): 48 U/L (ref 37–153)
BUN/Creatinine Ratio: 16 (calc) (ref 6–22)
BUN: 16 mg/dL (ref 7–25)
CO2: 28 mmol/L (ref 20–32)
Calcium: 8.8 mg/dL (ref 8.6–10.4)
Chloride: 100 mmol/L (ref 98–110)
Creat: 1 mg/dL — ABNORMAL HIGH (ref 0.50–0.99)
GFR, Est African American: 68 mL/min/{1.73_m2} (ref >=60)
GFR, Est Non African American: 59 mL/min/{1.73_m2} — ABNORMAL LOW (ref >=60)
Globulin: 3.3 g/dL (calc) (ref 1.9–3.7)
Glucose, Bld: 111 mg/dL — ABNORMAL HIGH (ref 65–99)
Potassium: 4.8 mmol/L (ref 3.5–5.3)
Sodium: 141 mmol/L (ref 135–146)
Total Bilirubin: 0.8 mg/dL (ref 0.2–1.2)
Total Protein: 7.5 g/dL (ref 6.1–8.1)

## 2020-01-16 LAB — CBC WITH DIFFERENTIAL/PLATELET
Absolute Monocytes: 598 cells/uL (ref 200–950)
Basophils Absolute: 61 cells/uL (ref 0–200)
Basophils Relative: 1 %
Eosinophils Absolute: 329 cells/uL (ref 15–500)
Eosinophils Relative: 5.4 %
HCT: 39.4 % (ref 35.0–45.0)
Hemoglobin: 13.1 g/dL (ref 11.7–15.5)
Lymphs Abs: 988 cells/uL (ref 850–3900)
MCH: 31.1 pg (ref 27.0–33.0)
MCHC: 33.2 g/dL (ref 32.0–36.0)
MCV: 93.6 fL (ref 80.0–100.0)
MPV: 12.4 fL (ref 7.5–12.5)
Monocytes Relative: 9.8 %
Neutro Abs: 4124 cells/uL (ref 1500–7800)
Neutrophils Relative %: 67.6 %
Platelets: 144 10*3/uL (ref 140–400)
RBC: 4.21 10*6/uL (ref 3.80–5.10)
RDW: 13.6 % (ref 11.0–15.0)
Total Lymphocyte: 16.2 %
WBC: 6.1 10*3/uL (ref 3.8–10.8)

## 2020-01-16 LAB — LIPID PANEL
Cholesterol: 220 mg/dL — ABNORMAL HIGH (ref <200)
HDL: 68 mg/dL (ref >=50)
LDL Cholesterol (Calc): 125 mg/dL (calc) — ABNORMAL HIGH
Non-HDL Cholesterol (Calc): 152 mg/dL (calc) — ABNORMAL HIGH (ref <130)
Total CHOL/HDL Ratio: 3.2 (calc) (ref <5.0)
Triglycerides: 154 mg/dL — ABNORMAL HIGH (ref <150)

## 2020-01-16 LAB — HEMOGLOBIN A1C
Hgb A1c MFr Bld: 5.9 % of total Hgb — ABNORMAL HIGH (ref <5.7)
Mean Plasma Glucose: 123 (calc)
eAG (mmol/L): 6.8 (calc)

## 2020-01-16 LAB — TSH: TSH: 2.7 mIU/L (ref 0.40–4.50)

## 2020-01-16 NOTE — Progress Notes (Signed)
Please call the patient and let her know that her chest xray is clear. Has she decided on which ENT physician she would like to see?  Thank you, Chrissie

## 2020-01-19 ENCOUNTER — Encounter: Payer: BC Managed Care – PPO | Admitting: Internal Medicine

## 2020-01-21 NOTE — Patient Instructions (Addendum)
Norco 5/325 take sparingly with food every 8 hours as needed for severe thoracic back pain.  Continue follow-up with Dr. Ethel Rana in [facility].

## 2020-01-23 ENCOUNTER — Other Ambulatory Visit: Payer: Self-pay

## 2020-01-23 ENCOUNTER — Encounter: Payer: Self-pay | Admitting: Internal Medicine

## 2020-01-23 ENCOUNTER — Ambulatory Visit (INDEPENDENT_AMBULATORY_CARE_PROVIDER_SITE_OTHER): Payer: Medicare Other | Admitting: Internal Medicine

## 2020-01-23 VITALS — BP 120/88 | HR 120 | Ht 67.5 in | Wt 189.0 lb

## 2020-01-23 DIAGNOSIS — E039 Hypothyroidism, unspecified: Secondary | ICD-10-CM

## 2020-01-23 DIAGNOSIS — F32A Depression, unspecified: Secondary | ICD-10-CM

## 2020-01-23 DIAGNOSIS — F419 Anxiety disorder, unspecified: Secondary | ICD-10-CM | POA: Diagnosis not present

## 2020-01-23 DIAGNOSIS — R609 Edema, unspecified: Secondary | ICD-10-CM

## 2020-01-23 DIAGNOSIS — F329 Major depressive disorder, single episode, unspecified: Secondary | ICD-10-CM

## 2020-01-23 DIAGNOSIS — E782 Mixed hyperlipidemia: Secondary | ICD-10-CM

## 2020-01-23 DIAGNOSIS — J4520 Mild intermittent asthma, uncomplicated: Secondary | ICD-10-CM

## 2020-01-23 DIAGNOSIS — Z0000 Encounter for general adult medical examination without abnormal findings: Secondary | ICD-10-CM | POA: Diagnosis not present

## 2020-01-23 DIAGNOSIS — Z23 Encounter for immunization: Secondary | ICD-10-CM

## 2020-01-23 DIAGNOSIS — B370 Candidal stomatitis: Secondary | ICD-10-CM

## 2020-01-23 DIAGNOSIS — M5414 Radiculopathy, thoracic region: Secondary | ICD-10-CM | POA: Diagnosis not present

## 2020-01-23 DIAGNOSIS — E559 Vitamin D deficiency, unspecified: Secondary | ICD-10-CM | POA: Diagnosis not present

## 2020-01-23 DIAGNOSIS — R490 Dysphonia: Secondary | ICD-10-CM

## 2020-01-23 DIAGNOSIS — Z8619 Personal history of other infectious and parasitic diseases: Secondary | ICD-10-CM | POA: Diagnosis not present

## 2020-01-23 DIAGNOSIS — Z96643 Presence of artificial hip joint, bilateral: Secondary | ICD-10-CM

## 2020-01-23 DIAGNOSIS — J302 Other seasonal allergic rhinitis: Secondary | ICD-10-CM

## 2020-01-23 DIAGNOSIS — E1169 Type 2 diabetes mellitus with other specified complication: Secondary | ICD-10-CM

## 2020-01-23 DIAGNOSIS — Z6835 Body mass index (BMI) 35.0-35.9, adult: Secondary | ICD-10-CM

## 2020-01-23 LAB — POCT URINALYSIS DIPSTICK
Appearance: NEGATIVE
Bilirubin, UA: NEGATIVE
Blood, UA: NEGATIVE
Glucose, UA: NEGATIVE
Ketones, UA: NEGATIVE
Leukocytes, UA: NEGATIVE
Nitrite, UA: NEGATIVE
Odor: NEGATIVE
Protein, UA: NEGATIVE
Spec Grav, UA: 1.01 (ref 1.010–1.025)
Urobilinogen, UA: 0.2 E.U./dL
pH, UA: 6.5 (ref 5.0–8.0)

## 2020-01-23 MED ORDER — ROSUVASTATIN CALCIUM 5 MG PO TABS: 5.0000 mg | ORAL_TABLET | Freq: Every day | ORAL | 3 refills | Status: DC

## 2020-01-23 MED ORDER — NYSTATIN 100000 UNIT/ML MT SUSP
5.0000 mL | Freq: Four times a day (QID) | OROMUCOSAL | 0 refills | Status: DC
Start: 2020-01-23 — End: 2020-03-27

## 2020-01-23 NOTE — Progress Notes (Signed)
Subjective:    Patient ID: Tara [name], female    DOB: 1955-12-19, 64 y.o.   MRN: [id number]  HPI 64 year old Female seen for welcome to Medicare wellness visit for health maintenance exam and evaluation of medical issues.  Continues to see Dr. Ermalene Postin in [facility] for left thoracic radiculopathy and receives epidural steroid injections from time to time.  Tends to get these on a monthly basis but was doing well earlier this month and canceled her injection.  She also was treated with Cymbalta.  History of chronic hepatitis C previously treated with Harvoni.  Patient thinks she contracted this by blood transfusion for TTP while living in [place].  Liver functions are stable.  History of breast cancer while living in [place] in 2012.  Had 10 mm suspicious abnormality on left mammogram.  Biopsy showed invasive ductal carcinoma grade 3 estrogen receptor positive/progesterone receptor positive/HER-2 positive.  Subsequently had left lumpectomy and sentinel node sampling December 2012.  Sentinel nodes were negative for tumor.  Patient had 4 cycles of adjuvant chemotherapy.  Chemotherapy was started in [place] in 2013 after she moved here from [facility].  She also received radiation treatment here.  Developed cellulitis in her left breast and was treated with Keflex.  History of impaired glucose tolerance.  Longstanding history of obesity and did for a while left hand: Healthy weight clinic but really was not committed recently.  History of TTP when she was 64 years of age.  Did not have splenectomy.  Says that she had recurrence at age 53 while being treated with interferon for hepatitis C.  History of bilateral hip replacements, right one in 2005 and left one in 2007.  History of hypothyroidism and allergic rhinitis.  History of depression treated with Celexa.  History of eczema left foot.  History of dependent edema.  In July 2017 she was admitted to hospital with history of hypokalemia  thought to be secondary to diarrhea.  Presumably had viral gastroenteritis with more than 10 bowel movements daily.  Was treated with IV fluids and IV potassium.  Subsequently she had a fall at home while she was sick with gastroenteritis and suffered a nondisplaced right posterior lateral eighth rib fracture.  History of left lower lobe pneumonia January 2017 treated as an outpatient.  In December 2016 she had CT of the chest with contrast for complaint of back and chest pain for 18 months.  Study showed no evidence of metastatic disease.  Social history: She is married.  She is a Agricultural engineer.  Does not smoke.  Husband is a Dance movement psychotherapist.  She drinks a glass of wine about twice weekly.  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  Family history: Mother with history of skin cancer but patient not sure what type.  Father died of COPD complications.  Apparently all of her sisters and younger brother are healthy.  Dr. Jana Hakim referred her to me in 2013 for primary care.  She snacks in the late afternoon and evening.  History of vitamin D deficiency.  Level in May 2018 was 18.  At that time was placed on high-dose vitamin D weekly.  I think she should continue with that indefinitely.  She saw a psychologist at weight loss center regarding barriers to weight loss.   Last colonoscopy June 2020 and recall advised 6-12 months. Patient aware of this and will call when ready. Had adenomatous polyp June 2020. Review of Systems  Constitutional: Positive for fatigue.  Respiratory: Negative.        History of asthma  Cardiovascular: Negative.   Gastrointestinal: Negative.   Genitourinary: Negative.   Psychiatric/Behavioral:       History of anxiety       Objective:   Physical Exam  Blood pressure 120/88, pulse 120-patient is anxious pulse oximetry 97% weight 227 pounds.  BMI 35.93.  Skin warm and dry.  No cervical adenopathy.  No thyromegaly.  No carotid bruits.   Chest clear to auscultation.  Breast without masses.  Cardiac exam tachycardia regular rate and rhythm normal S1 and S2.  Abdomen soft nondistended without hepatosplenomegaly masses or tenderness.  Bimanual exam is normal.  Pap deferred due to age.  No lower extremity pitting edema.  Neuro no gross focal deficits on brief neurological exam.  Affect is anxious.       Assessment & Plan:  Anxiety disorder and anxiety state patient worries a lot about her health  History of hepatitis C treated with Harvoni in the remote past  History of breast cancer treated, followed by Dr. Jana Hakim and released from cancer follow-up  History of parathoracic back pain treated with epidural steroid injections by Dr. Ermalene Postin in [facility] and takes Cymbalta for pain control  History of TTP in the remote past  Impaired glucose tolerance  Hypothyroidism stable on thyroid replacement  History of asthma-currently under control and has inhaler plus is on Singulair.  History of allergic rhinitis  Status post bilateral hip replacements  Remote history of cardiac dysrhythmia treated with digoxin.  She has not had this issue in a number of years and I think digoxin is no longer indicated.  Dependent edema-likely due to venous insufficiency and aggravated by obesity and hot weather treated with diuretic.  History of reflux treated with PPI.  Vitamin D deficiency-treated with high-dose vitamin D weekly  History of tubular adenoma-needs colonoscopy  Plan: Would like to see patient be more motivated about diet exercise and weight loss.  Has not been to weight loss clinic since September 2020.  Plan: She will continue with current medications.  Continue to encourage her with diet and exercise.  Follow-up in 6 months.  For now she is elected to continue with digoxin.  Also, patient states she has oral candidiasis and requests prescription for Diflucan which was given.  This may be due to diabetes.  No recent  antibiotic therapy.  Subjective:   Patient presents for Medicare Annual/Subsequent preventive examination.  Review Past Medical/Family/Social: See above   Risk Factors  Current exercise habits: Mostly sedentary Dietary issues discussed: Yes  Cardiac risk factors: Obesity and hyperlipidemia  Depression Screen  (Note: if answer to either of the following is "Yes", a more complete depression screening is indicated)   Over the past two weeks, have you felt down, depressed or hopeless? No  Over the past two weeks, have you felt little interest or pleasure in doing things? No Have you lost interest or pleasure in daily life? No Do you often feel hopeless? No Do you cry easily over simple problems? No   Activities of Daily Living  In your present state of health, do you have any difficulty performing the following activities?:   Driving? No  Managing money? No  Feeding yourself? No  Getting from bed to chair? No  Climbing a flight of stairs?  Yes due to obesity and deconditioning Preparing food and eating?:  Sometimes when prepping food Bathing or showering? No  Getting dressed: No  Getting to  the toilet? No  Using the toilet:No  Moving around from place to place: No  In the past year have you fallen or had a near fall?:No  Are you sexually active? No  Do you have more than one partner? No    Hearing Difficulties:  Do you often ask people to speak up or repeat themselves?  Yes Do you experience ringing or noises in your ears?  Yes Do you have difficulty understanding soft or whispered voices?  Sometimes Do you feel that you have a problem with memory?  Sometimes Do you often misplace items? No    Home Safety:  Do you have a smoke alarm at your residence? Yes Do you have grab bars in the bathroom?  None Do you have throw rugs in your house?  Yes   Cognitive Testing  Alert? Yes Normal Appearance?Yes  Oriented to person? Yes Place? Yes  Time? Yes  Recall of three  objects? Yes  Can perform simple calculations? Yes  Displays appropriate judgment?Yes  Can read the correct time from a watch face?Yes   List the Names of Other Physician/Practitioners you currently use:  See referral list for the physicians patient is currently seeing.     Review of Systems: See above   Objective:     General appearance: Appears stated age and obese, seems anxious Head: Normocephalic, without obvious abnormality, atraumatic  Eyes: conj clear, EOMi PEERLA  Ears: normal TM's and external ear canals both ears  Nose: Nares normal. Septum midline. Mucosa normal. No drainage or sinus tenderness.  Throat: lips, mucosa, and tongue normal; teeth and gums normal  Neck: no adenopathy, no carotid bruit, no JVD, supple, symmetrical, trachea midline and thyroid not enlarged, symmetric, no tenderness/mass/nodules  No CVA tenderness.  Lungs: clear to auscultation bilaterally  Breasts: normal appearance, no masses or tenderness Heart: regular rate and rhythm, S1, S2 normal, no murmur, click, rub or gallop  Abdomen: soft, non-tender; bowel sounds normal; no masses, no organomegaly  Musculoskeletal: ROM normal in all joints, no crepitus, no deformity, Normal muscle strengthen. Back  is symmetric, no curvature. Skin: Skin color, texture, turgor normal. No rashes or lesions  Lymph nodes: Cervical, supraclavicular, and axillary nodes normal.  Neurologic: CN 2 -12 Normal, Normal symmetric reflexes. Normal coordination and gait  Psych: Alert & Oriented x 3, Mood appear stable.    Assessment:    Annual wellness medicare exam   Plan:    During the course of the visit the patient was educated and counseled about appropriate screening and preventive services including:   Annual mammogram  Needs colonoscopy     Patient Instructions (the written plan) was given to the patient.  Medicare Attestation  I have personally reviewed:  The patient's medical and social history  Their  use of alcohol, tobacco or illicit drugs  Their current medications and supplements  The patient's functional ability including ADLs,fall risks, home safety risks, cognitive, and hearing and visual impairment  Diet and physical activities  Evidence for depression or mood disorders  The patient's weight, height, BMI, and visual acuity have been recorded in the chart. I have made referrals, counseling, and provided education to the patient based on review of the above and I have provided the patient with a written personalized care plan for preventive services.

## 2020-01-24 ENCOUNTER — Other Ambulatory Visit: Payer: Self-pay | Admitting: Internal Medicine

## 2020-01-29 NOTE — Patient Instructions (Signed)
Please try to work on diet exercise and weight loss.  Take vitamin D supplement weekly.  Follow-up in 6 months.

## 2020-01-30 ENCOUNTER — Telehealth: Payer: Self-pay

## 2020-01-30 NOTE — Telephone Encounter (Signed)
Letter written per C.Quita Skye fnp

## 2020-01-30 NOTE — Progress Notes (Signed)
As noted in the last message. Can you please mail the patient a letter to let her know that her chest x-ray is normal and to have her call our office and let us know which ENT she would like [name] to refer her to.

## 2020-02-08 NOTE — Telephone Encounter (Signed)
Cough please. Thank you!

## 2020-02-08 NOTE — Telephone Encounter (Signed)
Patient called requesting to be seen by Dr Laurance Flatten (ENT) per her last visit with Dr Shaune Leeks & Althea Charon , FNP. Chrissie can I have a diagnosis for this referral?  Thanks

## 2020-02-10 ENCOUNTER — Other Ambulatory Visit: Payer: Self-pay | Admitting: Internal Medicine

## 2020-02-13 NOTE — Telephone Encounter (Signed)
Referral placed.

## 2020-03-11 ENCOUNTER — Other Ambulatory Visit: Payer: Self-pay | Admitting: Internal Medicine

## 2020-03-11 DIAGNOSIS — E559 Vitamin D deficiency, unspecified: Secondary | ICD-10-CM

## 2020-03-14 ENCOUNTER — Other Ambulatory Visit: Payer: Self-pay | Admitting: Internal Medicine

## 2020-03-27 ENCOUNTER — Encounter: Payer: Self-pay | Admitting: Allergy and Immunology

## 2020-03-27 ENCOUNTER — Other Ambulatory Visit: Payer: Self-pay

## 2020-03-27 ENCOUNTER — Ambulatory Visit (INDEPENDENT_AMBULATORY_CARE_PROVIDER_SITE_OTHER): Payer: Medicare Other | Admitting: Allergy and Immunology

## 2020-03-27 VITALS — BP 126/88 | HR 92 | Temp 98.2°F | Resp 16 | Ht 67.0 in | Wt 240.2 lb

## 2020-03-27 DIAGNOSIS — J3089 Other allergic rhinitis: Secondary | ICD-10-CM | POA: Diagnosis not present

## 2020-03-27 DIAGNOSIS — K219 Gastro-esophageal reflux disease without esophagitis: Secondary | ICD-10-CM | POA: Diagnosis not present

## 2020-03-27 DIAGNOSIS — J4540 Moderate persistent asthma, uncomplicated: Secondary | ICD-10-CM | POA: Diagnosis not present

## 2020-03-27 MED ORDER — ADVAIR HFA 115-21 MCG/ACT IN AERO
2.0000 | INHALATION_SPRAY | Freq: Two times a day (BID) | RESPIRATORY_TRACT | 5 refills | Status: DC
Start: 2020-03-27 — End: 2020-05-27

## 2020-03-27 MED ORDER — AZELASTINE HCL 0.1 % NA SOLN: 1.0000 | Freq: Two times a day (BID) | NASAL | 5 refills | Status: DC | PRN

## 2020-03-27 NOTE — Progress Notes (Signed)
Follow-up Note  RE: Tara [name] MRN: [id number] DOB: April 03, 1956 Date of Office Visit: 03/27/2020  Primary care provider: [name], MD Referring provider: [name], MD  History of present illness: Tara [name] is a 64 y.o. female with persistent asthma, allergic rhinitis, and gastroesophageal reflux presented today for follow-up.  She was last seen in this clinic on Jan 15, 2020 by Dr. Shaune Leeks.  She reports that in the interval since her previous visit her asthma has been well controlled.  She is currently taking Advair 250-50 g Diskus, 1 inhalation 1 or 2 times daily.  She rarely requires albuterol rescue and does not experience nocturnal awakenings due to lower respiratory symptoms.  She does complain of hoarseness to the point of making it difficult to speak on the telephone or in person and enabling her from singing which she enjoys doing.  She also complains of occasional thrush.  Her nasal congestion has been somewhat well controlled with fluticasone nasal spray as needed, however she does complain of thick postnasal drainage.  Her reflux is reported to be well controlled with omeprazole 20 mg twice daily.  Assessment and plan: Moderate persistent asthma Given the patient's complaint of hoarseness and occasional thrush, we will switch from an dry powder inhaler to [facility] with a spacer.  A prescription has been provided for Advair 115/21 g, 2 inhalations twice daily.  To maximize pulmonary deposition, a spacer has been provided along with instructions for its proper administration with an HFA inhaler.  Continue montelukast 10 mg daily at bedtime.  Continue albuterol HFA, 1 to 2 inhalations every 4-6 hours if needed.  Subjective and objective measures of pulmonary function will be followed and the treatment plan will be adjusted accordingly.  Other allergic rhinitis  Continue appropriate allergen avoidance measures.  A prescription has been provided for azelastine  nasal spray, 1-2 sprays per nostril 2 times daily as needed. Proper nasal spray technique has been discussed and demonstrated.   Nasal saline lavage (NeilMed) has been recommended as needed and prior to medicated nasal sprays along with instructions for proper administration.  For thick post nasal drainage, add guaifenesin [phone] mg (Mucinex)  twice daily as needed with adequate hydration as discussed.  Gastroesophageal reflux disease  Continue appropriate reflux lifestyle modifications.  Continue omeprazole as prescribed.   Meds ordered this encounter  Medications  . fluticasone-salmeterol (ADVAIR HFA) 115-21 MCG/ACT inhaler    Sig: Inhale 2 puffs into the lungs 2 (two) times daily.    Dispense:  1 Inhaler    Refill:  5  . azelastine (ASTELIN) 0.1 % nasal spray    Sig: Place 1-2 sprays into both nostrils 2 (two) times daily as needed.    Dispense:  30 mL    Refill:  5    Diagnostics: Spirometry revealed an FVC of 2.69 L and an FEV1 of 2.33 L (86% predicted) without significant postbronchodilator improvement.  This study was performed while the patient was asymptomatic.  Please see scanned spirometry results for details.    Physical examination: Blood pressure (!) 126/88, pulse 92, temperature 98.2 F (36.8 C), temperature source Oral, resp. rate 16, height 5\' 7"  (1.702 m), weight (!) 240 lb 3.2 oz (109 kg), SpO2 95 %.  General: Alert, interactive, in no acute distress. HEENT: TMs pearly gray, turbinates moderately edematous without discharge, post-pharynx erythematous. Neck: Supple without lymphadenopathy. Lungs: Clear to auscultation without wheezing, rhonchi or rales. CV: Normal S1, S2 without murmurs. Skin: Warm and dry,  without lesions or rashes.  The following portions of the patient's history were reviewed and updated as appropriate: allergies, current medications, past family history, past medical history, past social history, past surgical history and problem  list.  Current Outpatient Medications  Medication Sig Dispense Refill  . acetaminophen (TYLENOL) 500 MG tablet Take 500 mg by mouth every 6 (six) hours as needed for mild pain, moderate pain or headache.     . albuterol (VENTOLIN HFA) 108 (90 Base) MCG/ACT inhaler Two puffs every 4 hours if needed for wheezing or coughing .  May use 2 puffs 5-15 minutes prior to exercise. 18 g 1  . cyclobenzaprine (FLEXERIL) 10 MG tablet 10 mg daily as needed for muscle spasms.     . digoxin (LANOXIN) 0.25 MG tablet TAKE ONE TABLET BY MOUTH DAILY 90 tablet 0  . fluticasone (FLONASE) 50 MCG/ACT nasal spray Two sprays each nostril at night as needed for nasal congestion 16 g 5  . Fluticasone-Salmeterol (ADVAIR DISKUS) 250-50 MCG/DOSE AEPB Inhale 1 puff into the lungs every 12 (twelve) hours. To prevent cough or wheeze. 60 each 5  . HYDROcodone-acetaminophen (NORCO) 5-325 MG tablet Take 1 tablet by mouth every 8 (eight) hours as needed for moderate pain. 15 tablet 0  . KLOR-CON M20 20 MEQ tablet TAKE ONE TABLET BY MOUTH DAILY 90 tablet 3  . levothyroxine (SYNTHROID) 88 MCG tablet TAKE ONE TABLET BY MOUTH DAILY 90 tablet 0  . LOTEMAX SM 0.38 % GEL     . metFORMIN (GLUCOPHAGE) 500 MG tablet TAKE ONE TABLET BY MOUTH TWO TIMES A DAY WITH A MEAL 180 tablet 2  . montelukast (SINGULAIR) 10 MG tablet TAKE ONE TABLET BY MOUTH EVERY NIGHT AT BEDTIME 90 tablet 3  . Multiple Vitamin (MULTIVITAMIN WITH MINERALS) TABS tablet Take 1 tablet by mouth daily.    Marland Kitchen omeprazole (PRILOSEC) 20 MG capsule Take one capsule twice a day to help prevent reflux 60 capsule 2  . rosuvastatin (CRESTOR) 5 MG tablet Take 1 tablet (5 mg total) by mouth daily. 90 tablet 3  . torsemide (DEMADEX) 20 MG tablet TAKE ONE TABLET BY MOUTH DAILY 90 tablet 1  . traMADol (ULTRAM) 50 MG tablet Take by mouth.    . triamcinolone cream (KENALOG) 0.1 % Apply twice a day if needed to red itchy areas below the face. 80 g 5  . Vitamin D, Ergocalciferol, (DRISDOL) 1.25  MG (50000 UNIT) CAPS capsule TAKE ONE CAPSULE BY MOUTH ONCE WEEKLY 12 capsule 0  . azelastine (ASTELIN) 0.1 % nasal spray Place 1-2 sprays into both nostrils 2 (two) times daily as needed. 30 mL 5  . fluticasone-salmeterol (ADVAIR HFA) 115-21 MCG/ACT inhaler Inhale 2 puffs into the lungs 2 (two) times daily. 1 Inhaler 5   Current Facility-Administered Medications  Medication Dose Route Frequency Provider Last Rate Last Admin  . 0.9 %  sodium chloride infusion  500 mL Intravenous Once [place], MD        Allergies  Allergen Reactions  . Aspirin Other (See Comments)    Pt has a hx of TTP.   . Nsaids Other (See Comments)    Pt has a hx of TTP.    Review of systems: Review of systems negative except as noted in HPI / PMHx.  Past Medical History:  Diagnosis Date  . Allergy   . Anxiety   . Arthritis   . Asthma   . Back pain   . Blood transfusion   . Breast cancer ([place])  2012   left breast  . Chronic edema   . Cold hands and feet   . Complication of anesthesia    difficulty waking up/dizzy/lightheaded  . Cough   . Diabetes mellitus without complication ([place])    on Metformin-   . Dysrhythmia    irregular heartbeat, takes digoxin  . Environmental allergies   . Eye pain   . Fatigue   . GERD (gastroesophageal reflux disease)    occasionally   . H/O varicella   . H/O varicose veins   . Hepatic cirrhosis ([place])   . Hepatitis C   . History of chemotherapy 2013   left breast cancer  . History of measles, mumps, or rubella   . Hoarseness   . Hx of radiation therapy 12/15/11 - 01/29/12   left breast  . Hypothyroidism   . Irregular heart beat    under control  . Joint pain   . Leg cramps   . Neuromuscular disorder ([place])    left foot nerve damage- neuropathy   . Osteopenia   . Palpitations   . Pre-diabetes   . Rheumatoid arthritis ([place])   . Ringing in ear   . Shortness of breath   . Swallowing difficulty   . Swelling of both lower extremities   .  Thrombocytopenia, primary ([place])   . TTP (thrombotic thrombocytopenic purpura) ([facility])     Family History  Problem Relation Age of Onset  . Pneumonia Mother   . COPD Mother   . Colon polyps Mother   . Heart disease Mother   . Thyroid disease Mother   . Obesity Mother   . COPD Father   . Obesity Father   . Brain cancer Maternal Grandfather   . Alcohol abuse Maternal Grandfather   . Hyperlipidemia Neg Hx   . Hypertension Neg Hx   . Colon cancer Neg Hx   . Rectal cancer Neg Hx   . Stomach cancer Neg Hx   . Esophageal cancer Neg Hx     Social History   Socioeconomic History  . Marital status: Married    Spouse name: Octavia Bruckner  . Number of children: Not on file  . Years of education: Not on file  . Highest education level: Not on file  Occupational History  . Occupation: Retired  Tobacco Use  . Smoking status: Never Smoker  . Smokeless tobacco: Never Used  Vaping Use  . Vaping Use: Never used  Substance and Sexual Activity  . Alcohol use: Yes    Alcohol/week: 1.0 standard drink    Types: 1 Glasses of wine per week    Comment: occ  . Drug use: No  . Sexual activity: Not on file  Other Topics Concern  . Not on file  Social History Narrative  . Not on file   Social Determinants of Health   Financial Resource Strain:   . Difficulty of Paying Living Expenses:   Food Insecurity:   . Worried About Charity fundraiser in the Last Year:   . Arboriculturist in the Last Year:   Transportation Needs:   . Film/video editor (Medical):   Marland Kitchen Lack of Transportation (Non-Medical):   Physical Activity:   . Days of Exercise per Week:   . Minutes of Exercise per Session:   Stress:   . Feeling of Stress :   Social Connections:   . Frequency of Communication with Friends and Family:   . Frequency of Social Gatherings with Friends and Family:   .  Attends Religious Services:   . Active Member of Clubs or Organizations:   . Attends Archivist Meetings:   Marland Kitchen Marital  Status:   Intimate Partner Violence:   . Fear of Current or Ex-Partner:   . Emotionally Abused:   Marland Kitchen Physically Abused:   . Sexually Abused:     I appreciate the opportunity to take part in [facility] care. Please do not hesitate to contact me with questions.  Sincerely,   R. Edgar Frisk, MD

## 2020-03-27 NOTE — Assessment & Plan Note (Addendum)
Given the patient's complaint of hoarseness and occasional thrush, we will switch from an dry powder inhaler to [facility] with a spacer.  A prescription has been provided for Advair 115/21 g, 2 inhalations twice daily.  To maximize pulmonary deposition, a spacer has been provided along with instructions for its proper administration with an HFA inhaler.  Continue montelukast 10 mg daily at bedtime.  Continue albuterol HFA, 1 to 2 inhalations every 4-6 hours if needed.  Subjective and objective measures of pulmonary function will be followed and the treatment plan will be adjusted accordingly.

## 2020-03-27 NOTE — Assessment & Plan Note (Addendum)
   Continue appropriate allergen avoidance measures.  A prescription has been provided for azelastine nasal spray, 1-2 sprays per nostril 2 times daily as needed. Proper nasal spray technique has been discussed and demonstrated.   Nasal saline lavage (NeilMed) has been recommended as needed and prior to medicated nasal sprays along with instructions for proper administration.  For thick post nasal drainage, add guaifenesin [phone] mg (Mucinex)  twice daily as needed with adequate hydration as discussed.

## 2020-03-27 NOTE — Assessment & Plan Note (Signed)
   Continue appropriate reflux lifestyle modifications.  Continue omeprazole as prescribed. 

## 2020-03-27 NOTE — Patient Instructions (Addendum)
Moderate persistent asthma Given the patient's complaint of hoarseness and occasional thrush, we will switch from an dry powder inhaler to [facility] with a spacer.  A prescription has been provided for Advair 115/21 g, 2 inhalations twice daily.  To maximize pulmonary deposition, a spacer has been provided along with instructions for its proper administration with an HFA inhaler.  Continue montelukast 10 mg daily at bedtime.  Continue albuterol HFA, 1 to 2 inhalations every 4-6 hours if needed.  Subjective and objective measures of pulmonary function will be followed and the treatment plan will be adjusted accordingly.  Other allergic rhinitis  Continue appropriate allergen avoidance measures.  A prescription has been provided for azelastine nasal spray, 1-2 sprays per nostril 2 times daily as needed. Proper nasal spray technique has been discussed and demonstrated.   Nasal saline lavage (NeilMed) has been recommended as needed and prior to medicated nasal sprays along with instructions for proper administration.  For thick post nasal drainage, add guaifenesin [phone] mg (Mucinex)  twice daily as needed with adequate hydration as discussed.  Gastroesophageal reflux disease  Continue appropriate reflux lifestyle modifications.  Continue omeprazole as prescribed.   Return in about 4 months (around 07/28/2020), or if symptoms worsen or fail to improve.

## 2020-03-28 ENCOUNTER — Other Ambulatory Visit: Payer: Self-pay | Admitting: Internal Medicine

## 2020-04-01 ENCOUNTER — Other Ambulatory Visit: Payer: Self-pay | Admitting: Internal Medicine

## 2020-04-25 NOTE — Telephone Encounter (Signed)
[facility] is where patient went in 2015 and they used to do acupuncture, however at this time they do not. Their person retired and they have not found a replacement.  Called and let patient know the name of where she went back in 2015 and also let he know what Dr Renold Genta had said she verbalized understanding.

## 2020-05-08 ENCOUNTER — Other Ambulatory Visit: Payer: Self-pay | Admitting: Internal Medicine

## 2020-05-09 ENCOUNTER — Telehealth: Payer: Self-pay | Admitting: Internal Medicine

## 2020-05-09 NOTE — Telephone Encounter (Signed)
I am concerned about the fever and weight loss. Symptoms have been going on  for over 2 weeks now. I think she should go to an urgent care near her home today to get checked out today.

## 2020-05-09 NOTE — Telephone Encounter (Signed)
I left her a voicemail letting her of Dr. Verlene Mayer recommendations. I asked to call me back.

## 2020-05-09 NOTE — Telephone Encounter (Signed)
Pt called and said that she hasnt felt well since 04/19/20, she said Wednasday she threw up and Thursday and Friday she had a tempeture of 100, she said she also feels naseaus later in the day. She said she also has lost 25lbs since 04/19/20 because she hasnt really been able to eat. She wants to schedule appt. When would you want to see her and how? CB# [phone]

## 2020-05-11 ENCOUNTER — Encounter (HOSPITAL_BASED_OUTPATIENT_CLINIC_OR_DEPARTMENT_OTHER): Payer: Self-pay | Admitting: Emergency Medicine

## 2020-05-11 ENCOUNTER — Other Ambulatory Visit: Payer: Self-pay

## 2020-05-11 ENCOUNTER — Emergency Department (HOSPITAL_BASED_OUTPATIENT_CLINIC_OR_DEPARTMENT_OTHER)
Admission: EM | Admit: 2020-05-11 | Discharge: 2020-05-11 | Disposition: A | Discharge status: Home / Self Care | Payer: Medicare Other | Attending: Emergency Medicine | Admitting: Emergency Medicine

## 2020-05-11 DIAGNOSIS — R531 Weakness: Secondary | ICD-10-CM

## 2020-05-11 DIAGNOSIS — I10 Essential (primary) hypertension: Secondary | ICD-10-CM | POA: Insufficient documentation

## 2020-05-11 DIAGNOSIS — E119 Type 2 diabetes mellitus without complications: Secondary | ICD-10-CM | POA: Insufficient documentation

## 2020-05-11 DIAGNOSIS — E039 Hypothyroidism, unspecified: Secondary | ICD-10-CM | POA: Diagnosis not present

## 2020-05-11 DIAGNOSIS — J45909 Unspecified asthma, uncomplicated: Secondary | ICD-10-CM | POA: Insufficient documentation

## 2020-05-11 DIAGNOSIS — Z79899 Other long term (current) drug therapy: Secondary | ICD-10-CM | POA: Diagnosis not present

## 2020-05-11 DIAGNOSIS — Z96643 Presence of artificial hip joint, bilateral: Secondary | ICD-10-CM | POA: Insufficient documentation

## 2020-05-11 DIAGNOSIS — E876 Hypokalemia: Secondary | ICD-10-CM | POA: Diagnosis not present

## 2020-05-11 DIAGNOSIS — Z79890 Hormone replacement therapy (postmenopausal): Secondary | ICD-10-CM | POA: Diagnosis not present

## 2020-05-11 DIAGNOSIS — R5382 Chronic fatigue, unspecified: Secondary | ICD-10-CM | POA: Diagnosis present

## 2020-05-11 DIAGNOSIS — Z20822 Contact with and (suspected) exposure to covid-19: Secondary | ICD-10-CM | POA: Diagnosis not present

## 2020-05-11 DIAGNOSIS — Z7984 Long term (current) use of oral hypoglycemic drugs: Secondary | ICD-10-CM | POA: Diagnosis not present

## 2020-05-11 LAB — COMPREHENSIVE METABOLIC PANEL
ALT: 30 U/L (ref 0–44)
AST: 33 U/L (ref 15–41)
Albumin: 4.1 g/dL (ref 3.5–5.0)
Alkaline Phosphatase: 45 U/L (ref 38–126)
Anion gap: 18 — ABNORMAL HIGH (ref 5–15)
BUN: 12 mg/dL (ref 8–23)
CO2: 30 mmol/L (ref 22–32)
Calcium: 7.5 mg/dL — ABNORMAL LOW (ref 8.9–10.3)
Chloride: 88 mmol/L — ABNORMAL LOW (ref 98–111)
Creatinine, Ser: 1.24 mg/dL — ABNORMAL HIGH (ref 0.44–1.00)
GFR calc Af Amer: 53 mL/min — ABNORMAL LOW (ref >60)
GFR calc non Af Amer: 46 mL/min — ABNORMAL LOW (ref >60)
Glucose, Bld: 157 mg/dL — ABNORMAL HIGH (ref 70–99)
Potassium: 3 mmol/L — ABNORMAL LOW (ref 3.5–5.1)
Sodium: 136 mmol/L (ref 135–145)
Total Bilirubin: 1.6 mg/dL — ABNORMAL HIGH (ref 0.3–1.2)
Total Protein: 7.5 g/dL (ref 6.5–8.1)

## 2020-05-11 LAB — URINALYSIS, ROUTINE W REFLEX MICROSCOPIC
Glucose, UA: NEGATIVE mg/dL
Hgb urine dipstick: NEGATIVE
Ketones, ur: NEGATIVE mg/dL
Nitrite: NEGATIVE
Protein, ur: NEGATIVE mg/dL
Specific Gravity, Urine: 1.01 (ref 1.005–1.030)
pH: 6 (ref 5.0–8.0)

## 2020-05-11 LAB — CBC WITH DIFFERENTIAL/PLATELET
Abs Immature Granulocytes: 0.04 10*3/uL (ref 0.00–0.07)
Basophils Absolute: 0 10*3/uL (ref 0.0–0.1)
Basophils Relative: 1 %
Eosinophils Absolute: 0.1 10*3/uL (ref 0.0–0.5)
Eosinophils Relative: 2 %
HCT: 38.2 % (ref 36.0–46.0)
Hemoglobin: 12.8 g/dL (ref 12.0–15.0)
Immature Granulocytes: 1 %
Lymphocytes Relative: 19 %
Lymphs Abs: 1 10*3/uL (ref 0.7–4.0)
MCH: 30.8 pg (ref 26.0–34.0)
MCHC: 33.5 g/dL (ref 30.0–36.0)
MCV: 92 fL (ref 80.0–100.0)
Monocytes Absolute: 1 10*3/uL (ref 0.1–1.0)
Monocytes Relative: 20 %
Neutro Abs: 3 10*3/uL (ref 1.7–7.7)
Neutrophils Relative %: 57 %
Platelets: 176 10*3/uL (ref 150–400)
RBC: 4.15 MIL/uL (ref 3.87–5.11)
RDW: 14.6 % (ref 11.5–15.5)
WBC: 5.2 10*3/uL (ref 4.0–10.5)
nRBC: 0 % (ref 0.0–0.2)

## 2020-05-11 LAB — URINALYSIS, MICROSCOPIC (REFLEX)

## 2020-05-11 LAB — TSH: TSH: 2.486 u[IU]/mL (ref 0.350–4.500)

## 2020-05-11 LAB — SARS CORONAVIRUS 2 BY RT PCR (HOSPITAL ORDER, PERFORMED IN ~~LOC~~ HOSPITAL LAB): SARS Coronavirus 2: NEGATIVE

## 2020-05-11 LAB — MAGNESIUM: Magnesium: 1.8 mg/dL (ref 1.7–2.4)

## 2020-05-11 MED ORDER — POTASSIUM CHLORIDE CRYS ER 20 MEQ PO TBCR
60.0000 meq | EXTENDED_RELEASE_TABLET | Freq: Once | ORAL | Status: AC
  Administered 2020-05-11: 60 meq via ORAL
  Filled 2020-05-11: qty 3

## 2020-05-11 MED ORDER — LACTATED RINGERS IV BOLUS
1000.0000 mL | Freq: Once | INTRAVENOUS | Status: AC
  Administered 2020-05-11: 1000 mL via INTRAVENOUS

## 2020-05-11 NOTE — ED Triage Notes (Signed)
Generalized weakness with 29lb weight loss x 1 month, intermittent vomiting. Reports tingling in R hand since 5am. Denies pain. Endorses nausea.

## 2020-05-11 NOTE — ED Notes (Signed)
Pt given snack with verbal approval from Dr. Wilson Singer

## 2020-05-11 NOTE — ED Notes (Signed)
Pt discharged to home. Discharge instructions have been discussed with patient and/or family members. Pt verbally acknowledges understanding d/c instructions, and endorses comprehension to checkout at registration before leaving.  °

## 2020-05-12 LAB — URINE CULTURE

## 2020-05-13 ENCOUNTER — Other Ambulatory Visit: Payer: Self-pay | Admitting: Internal Medicine

## 2020-05-15 NOTE — ED Provider Notes (Signed)
[facility] DEPARTMENT Provider Note   CSN: [id number] Arrival date & time: 05/11/20  0910   ===  History Chief Complaint  Patient presents with  . Weakness    Tara [name] is a 64 y.o. female.  HPI   64yF with numerous complaints. Onset about a month ago. Generalized fatigue, nausea. Poor appetite. Almost 30 lbs unintended weight loss. No fever. No v/d. No urinary complaints. No sick contacts that she aware of.    Past Medical History:  Diagnosis Date  . Allergy   . Anxiety   . Arthritis   . Asthma   . Back pain   . Blood transfusion   . Breast cancer ([place]) 2012   left breast  . Chronic edema   . Cold hands and feet   . Complication of anesthesia    difficulty waking up/dizzy/lightheaded  . Cough   . Diabetes mellitus without complication ([place])    on Metformin-   . Dysrhythmia    irregular heartbeat, takes digoxin  . Environmental allergies   . Eye pain   . Fatigue   . GERD (gastroesophageal reflux disease)    occasionally   . H/O varicella   . H/O varicose veins   . Hepatic cirrhosis ([place])   . Hepatitis C   . History of chemotherapy 2013   left breast cancer  . History of measles, mumps, or rubella   . Hoarseness   . Hx of radiation therapy 12/15/11 - 01/29/12   left breast  . Hypothyroidism   . Irregular heart beat    under control  . Joint pain   . Leg cramps   . Neuromuscular disorder ([place])    left foot nerve damage- neuropathy   . Osteopenia   . Palpitations   . Pre-diabetes   . Rheumatoid arthritis ([place])   . Ringing in ear   . Shortness of breath   . Swallowing difficulty   . Swelling of both lower extremities   . Thrombocytopenia, primary ([place])   . TTP (thrombotic thrombocytopenic purpura) [facility])     Patient Active Problem List   Diagnosis Date Noted  . Moderate persistent asthma 01/15/2020  . History of cardiac arrhythmia 01/15/2020  . History of breast cancer 01/15/2020  . History of osteoporosis 01/15/2020  .  Flexural atopic dermatitis 01/15/2020  . History of hepatitis C 01/15/2020  . Cough 01/15/2020  . Arthritis 11/10/2019  . Prediabetes 10/25/2018  . Class 2 severe obesity with serious comorbidity and body mass index (BMI) of 37.0 to 37.9 in adult ([place]) 10/25/2018  . Vitamin D deficiency 10/25/2018  . History of left hip replacement 08/29/2018  . History of total right hip replacement 08/29/2018  . Controlled type 2 diabetes mellitus without complication, without long-term current use of insulin ([place]) 07/11/2018  . Cirrhosis ([place]) 05/10/2017  . Gastroesophageal reflux disease 11/05/2015  . Malignant neoplasm of axillary tail of left breast in female, estrogen receptor positive ([place]) 08/12/2015  . Malignant neoplasm of upper-outer quadrant of left breast in female, estrogen receptor positive ([place]) 07/17/2014  . Hypokalemia 12/29/2013  . Other allergic rhinitis 10/29/2012  . Status post bilateral hip replacements 10/29/2012  . History of TTP (thrombotic thrombocytopenic purpura) 10/29/2012  . Chronic hepatitis C without hepatic coma ([place]) 10/29/2012  . Hx of radiation therapy   . Lymphedema of arm 12/15/2011  . Rosacea, acne 09/28/2011  . Dysrhythmia, cardiac 09/28/2011  . Hypothyroidism 09/07/2011    Past Surgical History:  Procedure Laterality Date  .  BREAST LUMPECTOMY  08/03/11   left lumpectomy and slnbx,T1cN0,triple pos  . CATARACT EXTRACTION, BILATERAL    . COLONOSCOPY    . elbow pins Left   . FOOT SURGERY Left    multiple  . POLYPECTOMY    . PORTACATH PLACEMENT  08/03/2011   Procedure: INSERTION PORT-A-CATH;  Surgeon: Luella Cook III, MD;  Location: [facility] OR;  Service: General;  Laterality: Right;  . portacath removal    . TOTAL HIP ARTHROPLASTY Bilateral   . UPPER GASTROINTESTINAL ENDOSCOPY       OB History    Gravida  0   Para  0   Term  0   Preterm  0   AB  0   Living  0     SAB  0   TAB  0   Ectopic  0   Multiple  0   Live Births               Family History  Problem Relation Age of Onset  . Pneumonia Mother   . COPD Mother   . Colon polyps Mother   . Heart disease Mother   . Thyroid disease Mother   . Obesity Mother   . COPD Father   . Obesity Father   . Brain cancer Maternal Grandfather   . Alcohol abuse Maternal Grandfather   . Hyperlipidemia Neg Hx   . Hypertension Neg Hx   . Colon cancer Neg Hx   . Rectal cancer Neg Hx   . Stomach cancer Neg Hx   . Esophageal cancer Neg Hx     Social History   Tobacco Use  . Smoking status: Never Smoker  . Smokeless tobacco: Never Used  Vaping Use  . Vaping Use: Never used  Substance Use Topics  . Alcohol use: Yes    Alcohol/week: 1.0 standard drink    Types: 1 Glasses of wine per week    Comment: occ  . Drug use: No    Home Medications Prior to Admission medications   Medication Sig Start Date End Date Taking? Authorizing Provider  acetaminophen (TYLENOL) 500 MG tablet Take 500 mg by mouth every 6 (six) hours as needed for mild pain, moderate pain or headache.     [provider]  albuterol (VENTOLIN HFA) 108 (90 Base) MCG/ACT inhaler Two puffs every 4 hours if needed for wheezing or coughing .  May use 2 puffs 5-15 minutes prior to exercise. 11/28/19   Charlies Silvers, MD  azelastine (ASTELIN) 0.1 % nasal spray Place 1-2 sprays into both nostrils 2 (two) times daily as needed. 03/27/20   Bobbitt, [place] Muta, MD  cyclobenzaprine (FLEXERIL) 10 MG tablet 10 mg daily as needed for muscle spasms.  12/02/16   [provider]  digoxin (LANOXIN) 0.25 MG tablet TAKE ONE TABLET BY MOUTH DAILY 05/13/20   [name], MD  fluticasone [facility]) 50 MCG/ACT nasal spray Two sprays each nostril at night as needed for nasal congestion 11/28/19   Charlies Silvers, MD  Fluticasone-Salmeterol (ADVAIR DISKUS) 250-50 MCG/DOSE AEPB Inhale 1 puff into the lungs every 12 (twelve) hours. To prevent cough or wheeze. 11/28/19   Charlies Silvers, MD  fluticasone-salmeterol  (ADVAIR HFA) 196-22 MCG/ACT inhaler Inhale 2 puffs into the lungs 2 (two) times daily. 03/27/20   Bobbitt, [place] Muta, MD  HYDROcodone-acetaminophen (NORCO) 5-325 MG tablet Take 1 tablet by mouth every 8 (eight) hours as needed for moderate pain. 01/12/20   [name], MD  KLOR-CON M20 20 MEQ tablet TAKE ONE TABLET BY MOUTH DAILY 01/24/20   [name], MD  levothyroxine (SYNTHROID) 88 MCG tablet TAKE ONE TABLET BY MOUTH DAILY 05/08/20   [name], MD  LOTEMAX SM 0.38 % GEL  11/22/19   [provider]  metFORMIN (GLUCOPHAGE) 500 MG tablet TAKE ONE TABLET BY MOUTH TWICE A DAY WITH A MEAL 03/28/20   [name], MD  montelukast (SINGULAIR) 10 MG tablet TAKE ONE TABLET BY MOUTH EVERY NIGHT AT BEDTIME 07/31/19   [name], MD  Multiple Vitamin (MULTIVITAMIN WITH MINERALS) TABS tablet Take 1 tablet by mouth daily.    [provider]  omeprazole (PRILOSEC) 20 MG capsule Take one capsule twice a day to help prevent reflux 01/15/20   Charlies Silvers, MD  rosuvastatin (CRESTOR) 5 MG tablet TAKE ONE TABLET BY MOUTH THREE TIMES A WEEK 04/01/20   [name], MD  torsemide (DEMADEX) 20 MG tablet TAKE ONE TABLET BY MOUTH DAILY 03/14/20   [name], MD  traMADol (ULTRAM) 50 MG tablet Take by mouth. 02/07/20   [provider]  triamcinolone cream (KENALOG) 0.1 % Apply twice a day if needed to red itchy areas below the face. 11/28/19   Charlies Silvers, MD  Vitamin D, Ergocalciferol, (DRISDOL) 1.25 MG (50000 UNIT) CAPS capsule TAKE ONE CAPSULE BY MOUTH ONCE WEEKLY 03/11/20   [name], MD    Allergies    Aspirin and Nsaids  Review of Systems   Review of Systems All systems reviewed and negative, other than as noted in HPI.  Physical Exam Updated Vital Signs BP 112/67   Pulse 74   Temp 98.4 F (36.9 C) (Oral)   Resp 19   Ht 5\' 7"  (1.702 m)   Wt 102.1 kg   SpO2 98%   BMI 35.24 kg/m   Physical Exam Vitals and nursing note reviewed.  Constitutional:       General: She is not in acute distress.    Appearance: She is well-developed. She is obese.  HENT:     Head: Normocephalic and atraumatic.  Eyes:     General:        Right eye: No discharge.        Left eye: No discharge.     Conjunctiva/sclera: Conjunctivae normal.  Cardiovascular:     Rate and Rhythm: Normal rate and regular rhythm.     Heart sounds: Normal heart sounds. No murmur heard.  No friction rub. No gallop.   Pulmonary:     Effort: Pulmonary effort is normal. No respiratory distress.     Breath sounds: Normal breath sounds.  Abdominal:     General: There is no distension.     Palpations: Abdomen is soft.     Tenderness: There is no abdominal tenderness.  Musculoskeletal:        General: No tenderness.     Cervical back: Neck supple.  Skin:    General: Skin is warm and dry.  Neurological:     Mental Status: She is alert.  Psychiatric:        Behavior: Behavior normal.        Thought Content: Thought content normal.     ED Results / Procedures / Treatments   Labs (all labs ordered are listed, but only abnormal results are displayed) Labs Reviewed  URINE CULTURE - Abnormal; Notable for the following components:      Result Value   Culture MULTIPLE SPECIES  PRESENT, SUGGEST RECOLLECTION (*)    All other components within normal limits  COMPREHENSIVE METABOLIC PANEL - Abnormal; Notable for the following components:   Potassium 3.0 (*)    Chloride 88 (*)    Glucose, Bld 157 (*)    Creatinine, Ser 1.24 (*)    Calcium 7.5 (*)    Total Bilirubin 1.6 (*)    GFR calc non Af Amer 46 (*)    GFR calc Af Amer 53 (*)    Anion gap 18 (*)    All other components within normal limits  URINALYSIS, ROUTINE W REFLEX MICROSCOPIC - Abnormal; Notable for the following components:   Bilirubin Urine SMALL (*)    Leukocytes,Ua SMALL (*)    All other components within normal limits  URINALYSIS, MICROSCOPIC (REFLEX) - Abnormal; Notable for the following components:    Bacteria, UA MANY (*)    All other components within normal limits  SARS CORONAVIRUS 2 BY RT PCR (HOSPITAL ORDER, [facility])  CBC WITH DIFFERENTIAL/PLATELET  TSH  MAGNESIUM    EKG EKG Interpretation  Date/Time:  Saturday May 11 2020 09:55:21 EDT Ventricular Rate:  78 PR Interval:    QRS Duration: 105 QT Interval:  392 QTC Calculation: 447 R Axis:   -19 Text Interpretation: Sinus rhythm Borderline left axis deviation Abnormal R-wave progression, early transition Nonspecific repol abnormality, lateral leads No significant change since prior 7/17 Confirmed by Aletta Edouard [phone]) on 05/12/2020 11:05:46 AM   Radiology No results found.  Procedures Procedures (including critical care time)  Medications Ordered in ED Medications  lactated ringers bolus 1,000 mL (0 mLs Intravenous Stopped 05/11/20 1449)  potassium chloride SA (KLOR-CON) CR tablet 60 mEq (60 mEq Oral Given 05/11/20 1200)    ED Course  I have reviewed the triage vital signs and the nursing notes.  Pertinent labs & imaging results that were available during my care of the patient were reviewed by me and considered in my medical decision making (see chart for details).    MDM Rules/Calculators/A&P                          64yF with generalized weakness. Mild hypokalemia and perhaps some mild dehydration. Given IVF. Currently feeling better. I'm not sure of exact etiology but I doubt emergent process. If symptoms persist, particularly weight loss, needs further evaluation.   Final Clinical Impression(s) / ED Diagnoses Final diagnoses:  Generalized weakness  Hypokalemia    Rx / DC Orders ED Discharge Orders    None       Virgel Manifold, MD 05/15/20 1025

## 2020-05-17 ENCOUNTER — Encounter: Payer: Self-pay | Admitting: Internal Medicine

## 2020-05-17 ENCOUNTER — Ambulatory Visit (INDEPENDENT_AMBULATORY_CARE_PROVIDER_SITE_OTHER): Payer: Medicare Other | Admitting: Internal Medicine

## 2020-05-17 ENCOUNTER — Other Ambulatory Visit: Payer: Self-pay

## 2020-05-17 VITALS — BP 102/60 | HR 78 | Ht 67.0 in | Wt 240.0 lb

## 2020-05-17 DIAGNOSIS — F419 Anxiety disorder, unspecified: Secondary | ICD-10-CM

## 2020-05-17 DIAGNOSIS — E876 Hypokalemia: Secondary | ICD-10-CM

## 2020-05-17 DIAGNOSIS — E869 Volume depletion, unspecified: Secondary | ICD-10-CM

## 2020-05-17 DIAGNOSIS — Z23 Encounter for immunization: Secondary | ICD-10-CM | POA: Diagnosis not present

## 2020-05-17 DIAGNOSIS — F329 Major depressive disorder, single episode, unspecified: Secondary | ICD-10-CM

## 2020-05-17 DIAGNOSIS — R8290 Unspecified abnormal findings in urine: Secondary | ICD-10-CM | POA: Diagnosis not present

## 2020-05-17 DIAGNOSIS — R634 Abnormal weight loss: Secondary | ICD-10-CM

## 2020-05-17 DIAGNOSIS — F32A Depression, unspecified: Secondary | ICD-10-CM

## 2020-05-17 LAB — POCT URINALYSIS DIPSTICK
Appearance: NEGATIVE
Bilirubin, UA: NEGATIVE
Blood, UA: NEGATIVE
Glucose, UA: NEGATIVE
Ketones, UA: NEGATIVE
Nitrite, UA: NEGATIVE
Odor: NEGATIVE
Protein, UA: NEGATIVE
Spec Grav, UA: 1.01 (ref 1.010–1.025)
Urobilinogen, UA: 0.2 E.U./dL
pH, UA: 6.5 (ref 5.0–8.0)

## 2020-05-17 NOTE — Progress Notes (Signed)
   Subjective:    Patient ID: Tara [name], female    DOB: 29-Dec-1955, 64 y.o.   MRN: [id number]  HPI 64 year old Female recalls that she fell tired and drained for about 2 weeks. Her appetite decreased and she was eating very little. She is not sure why it this was going on. Her husband was cooking and she was eating very little. Says she had fever of 100 degrees for 2 days. She also had some diarrhea. She was worried about COVID-19. She went to the emergency department at [facility] on September 11 complaining of generalized fatigue nausea and poor appetite. Says she had 30 pounds of unintended weight loss. At that time she weighed 102.1 kg (224.62 pounds). She was afebrile and her blood pressure was stable as was her pulse. Her BMI was 35.24.  Today she weighs 240 pounds with BMI of 37.59. A has gained 16 pounds since September 11.  Was told that torsemide could have contributed to her weight loss. Potassium was 3.0. Chloride was low at 88. Sodium was normal at 136. Glucose was 157. Creatinine was 1.24. In May her creatinine was 1.00. It seems that she was volume depleted. She was given 1 L of lactated Ringer's and 60 mEq p.o. potassium chloride. She felt better after that. She was advised to follow-up here regarding weight loss and hypokalemia.  Denies being significantly depressed but may be a little depressed due to the pandemic.  We checked a urine specimen today and specific gravity is 1.010 he has small LE present. Culture was sent. Urine was also sent for microscopic evaluation. She had a negative COVID-19 test on September 11. Also at that time her magnesium level was normal at 1.8. Her TSH was normal at 2.86. Her CBC was normal. Her calcium was low at 7.5. Her bilirubin was slightly elevated at 1.6. Her liver functions were normal. Total protein and albumin were normal.    Review of Systems see above-she has a history of anxiety and depression. She has a history of dependent  edema treated with torsemide. She feels that since she has lost weight perhaps she needs a less strong diuretic. I would like to try her off diuretics altogether and see how things go.     Objective:   Physical Exam Blood pressure 102/60 pulse 78 pulse oximetry 97% weight 240 pounds BMI 37.59  Skin warm and dry. No thyromegaly. Chest clear to auscultation. Cardiac exam regular rate and rhythm. No pitting edema.       Assessment & Plan:  History of dependent edema  Unexplained weight loss? Volume depletion treated with IV fluids and potassium supplement on September 11 in emergency department  History of depression and anxiety  Plan: Her urine specimen is abnormal. Culture was sent. Blood specific gravity is normal at 1.010  We are going to leave her off diuretic at the present time and see how things go. She has 50-month follow-up appointment here in November. C-Met drawn today.  Time spent reviewing records, seeing patient and medical decision making is 30 minutes. Flu vaccine given.

## 2020-05-17 NOTE — Patient Instructions (Addendum)
Discontinue torsemide. Complete metabolic panel drawn today. Follow-up in November. Flu vaccine given today.

## 2020-05-18 LAB — URINE CULTURE
MICRO NUMBER:: 10964544
SPECIMEN QUALITY:: ADEQUATE

## 2020-05-18 LAB — COMPLETE METABOLIC PANEL WITH GFR
AG Ratio: 1.5 (calc) (ref 1.0–2.5)
ALT: 19 U/L (ref 6–29)
AST: 18 U/L (ref 10–35)
Albumin: 4 g/dL (ref 3.6–5.1)
Alkaline phosphatase (APISO): 45 U/L (ref 37–153)
BUN: 8 mg/dL (ref 7–25)
CO2: 27 mmol/L (ref 20–32)
Calcium: 7.2 mg/dL — ABNORMAL LOW (ref 8.6–10.4)
Chloride: 103 mmol/L (ref 98–110)
Creat: 0.96 mg/dL (ref 0.50–0.99)
GFR, Est African American: 72 mL/min/{1.73_m2} (ref >=60)
GFR, Est Non African American: 62 mL/min/{1.73_m2} (ref >=60)
Globulin: 2.7 g/dL (calc) (ref 1.9–3.7)
Glucose, Bld: 96 mg/dL (ref 65–99)
Potassium: 5 mmol/L (ref 3.5–5.3)
Sodium: 141 mmol/L (ref 135–146)
Total Bilirubin: 0.6 mg/dL (ref 0.2–1.2)
Total Protein: 6.7 g/dL (ref 6.1–8.1)

## 2020-05-18 LAB — URINALYSIS, MICROSCOPIC ONLY
Bacteria, UA: NONE SEEN /HPF
Hyaline Cast: NONE SEEN /LPF
RBC / HPF: NONE SEEN /HPF (ref 0–2)

## 2020-05-23 IMAGING — PT NM PET TUM IMG INITIAL (PI) SKULL BASE T - THIGH
7 series · 25 of 25 positions shown · non-contrast
Comparison: CT chest 04/13/2018, CT abdomen pelvis 03/18/2018 and
bone scan 03/11/2018.

CLINICAL DATA: Initial treatment strategy for enlarged lymph nodes.

EXAM:
NUCLEAR MEDICINE PET SKULL BASE TO THIGH
TECHNIQUE: 13.1 mCi F-18 FDG was injected intravenously. Full-ring PET imaging
was performed from the skull base to thigh after the radiotracer. CT
data was obtained and used for attenuation correction and anatomic
localization.
Fasting blood glucose: 132 mg/dl

[Series 3: pet sk_thigh ac · axial · 5.0mm · 4.07mm/px · z∈[-1322,-390]mm · 5 of 234 slices shown]
[im 1/234]
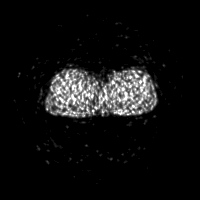
[im 59/234]
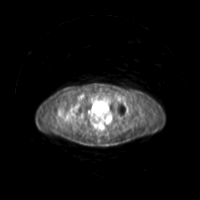
[im 117/234]
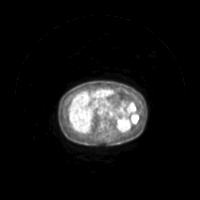
[im 175/234]
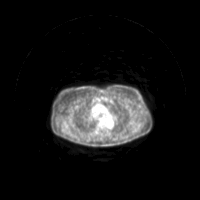
[im 234/234]
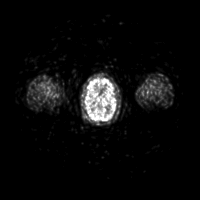

[Series 4: ct sk_thigh 5.0 b31f · axial · 5.0mm · 0.98mm/px · z∈[-1322,-390]mm · 5 of 234 slices shown]
[im 1/234]
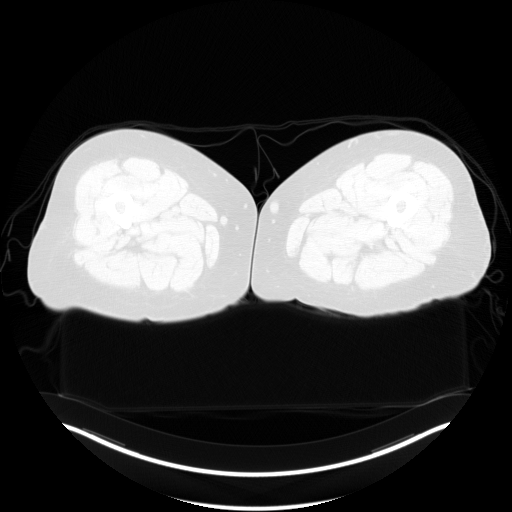
[im 59/234]
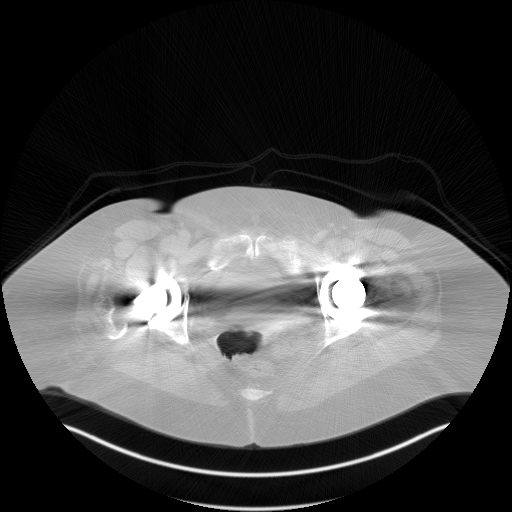
[im 117/234  brain]
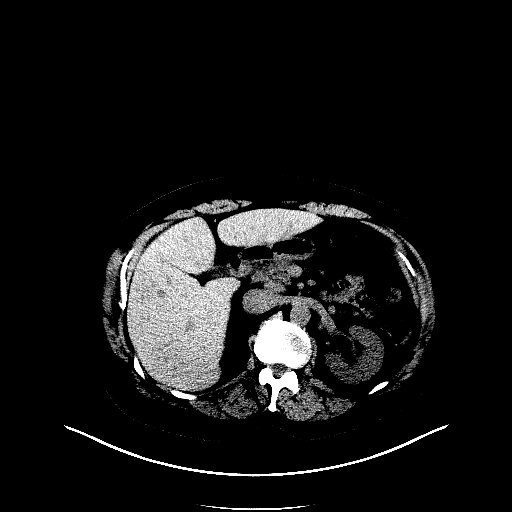
[im 175/234]
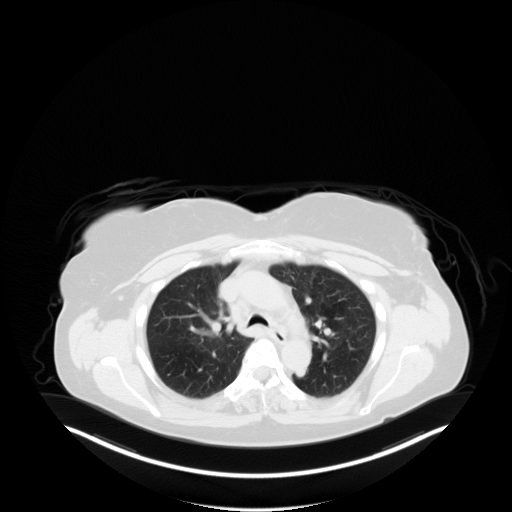
[im 234/234  brain]
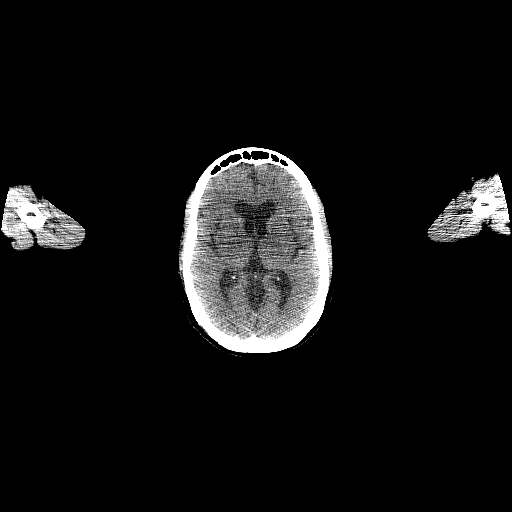

[Series 5: pet sk_thigh nac · axial · 5.0mm · 4.07mm/px · z∈[-1322,-390]mm · 5 of 234 slices shown]
[im 1/234]
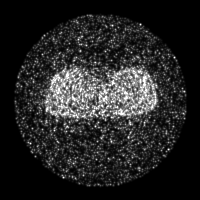
[im 59/234]
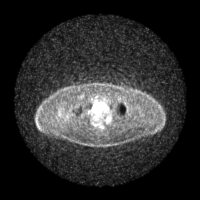
[im 117/234]
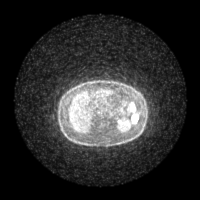
[im 175/234]
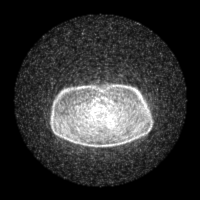
[im 234/234]
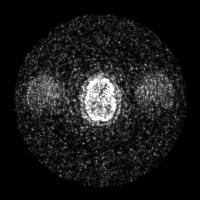

[Series 8: ct sk_thigh 5.0 b70f (id)_bone · axial · 5.0mm · 0.70mm/px · z∈[-798,-522]mm · 2 of 70 slices shown]
[im 1/70  bone]
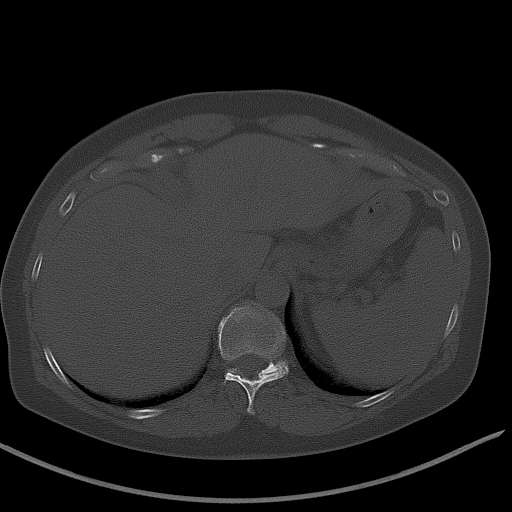
[im 70/70  bone]
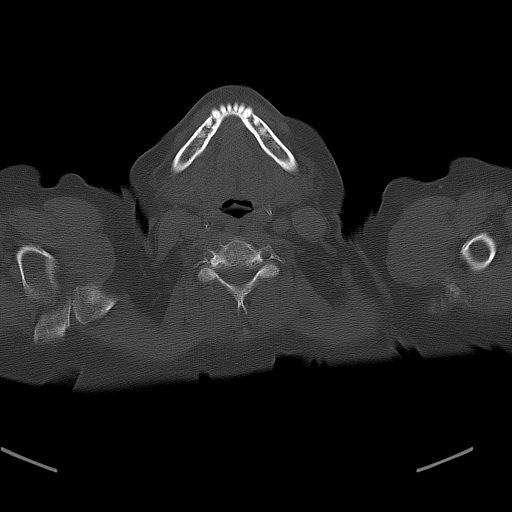

[Series 603: range-ct sk_thigh 5.0 (id)<alpha range> · 2 of 82 slices shown (1 of 2)]
[im 1/82]
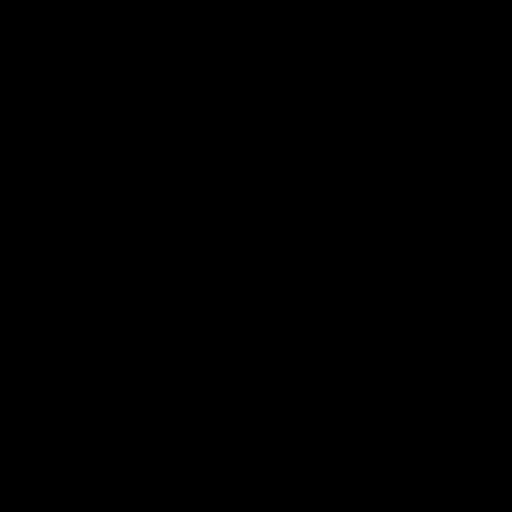
[im 82/82]
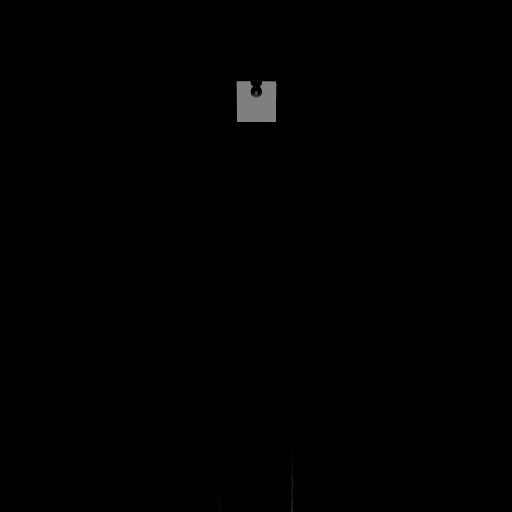

[Series 604: mip range · coronal · 1.93mm/px · 1 of 32 slices shown]
[im 1/32]
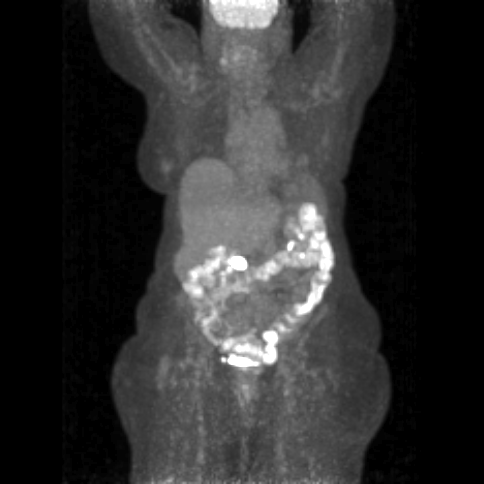

[Series 605: range-ct sk_thigh 5.0 (id)<alpha range> · 5 of 228 slices shown (2 of 2)]
[im 1/228]
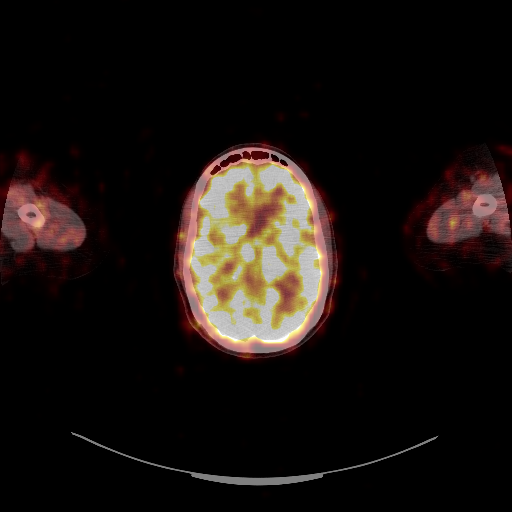
[im 57/228]
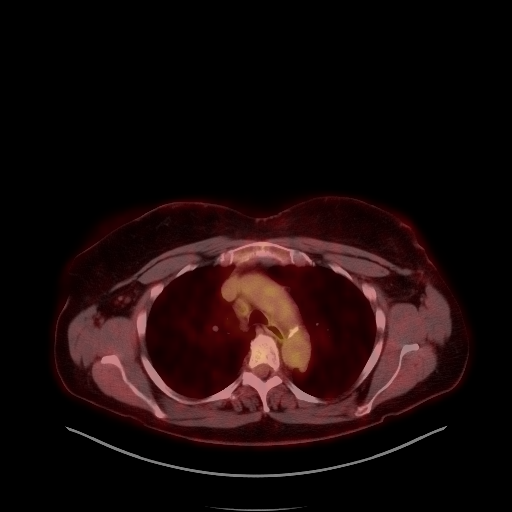
[im 114/228]
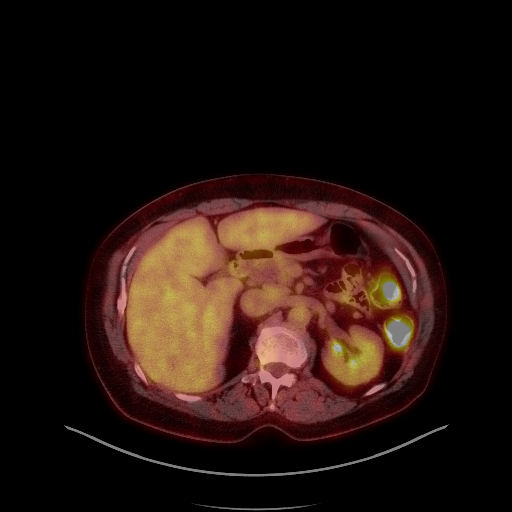
[im 171/228]
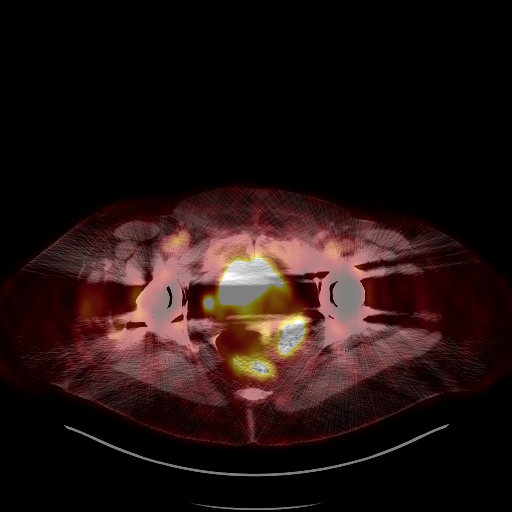
[im 228/228]
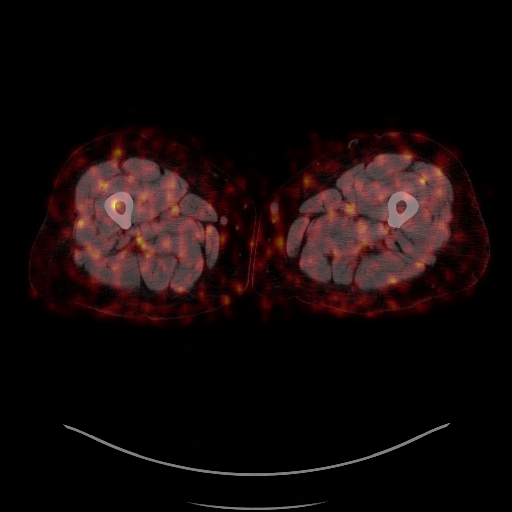

[25 of 25 positions shown; findings below may reference images not displayed]

FINDINGS: Mediastinal blood pool activity: SUV max

NECK: No abnormal hypermetabolism.

Incidental CT findings: None.

CHEST: 1.4 cm right paratracheal lymph node has a fatty hilum, with
an SUV max of 2.6 which is not considered to be above blood pool. No
hypermetabolic hilar or axillary lymph nodes. No hypermetabolic
pulmonary nodules.

Incidental CT findings: Heart is enlarged. No pericardial or pleural
effusion. Atherosclerotic calcification of the arterial vasculature.

ABDOMEN/PELVIS: Mildly hypermetabolic porta hepatis lymph node
measures 1.6 cm (CT image 112) with an SUV max of 4.1. No additional
hypermetabolic lymph nodes in the abdomen or pelvis. No abnormal
hypermetabolism in liver, adrenal glands, spleen or pancreas.

Incidental CT findings: Low-attenuation lesions in the right hepatic
lobe are too small to characterize. Liver, gallbladder, adrenal
glands, kidneys, spleen, pancreas, stomach and bowel are otherwise
unremarkable.

SKELETON: No abnormal osseous hypermetabolism. Bilateral hip
arthroplasties. Degenerative changes in the spine.

Incidental CT findings: None.
IMPRESSION: 1. Mildly hypermetabolic peripancreatic lymph node is nonspecific.
No additional hypermetabolic lymph nodes in the neck, chest, abdomen
or pelvis.
2.  Aortic atherosclerosis (UYM4X-170.0).

## 2020-05-24 ENCOUNTER — Other Ambulatory Visit: Payer: Medicare Other | Admitting: Internal Medicine

## 2020-05-24 ENCOUNTER — Other Ambulatory Visit: Payer: Self-pay

## 2020-05-24 DIAGNOSIS — E559 Vitamin D deficiency, unspecified: Secondary | ICD-10-CM

## 2020-05-27 ENCOUNTER — Ambulatory Visit (INDEPENDENT_AMBULATORY_CARE_PROVIDER_SITE_OTHER): Payer: Medicare Other | Admitting: Internal Medicine

## 2020-05-27 ENCOUNTER — Encounter: Payer: Self-pay | Admitting: Internal Medicine

## 2020-05-27 ENCOUNTER — Other Ambulatory Visit: Payer: Self-pay

## 2020-05-27 VITALS — BP 110/80 | HR 82 | Temp 97.8°F | Ht 67.0 in | Wt 238.0 lb

## 2020-05-27 DIAGNOSIS — F419 Anxiety disorder, unspecified: Secondary | ICD-10-CM

## 2020-05-27 DIAGNOSIS — E039 Hypothyroidism, unspecified: Secondary | ICD-10-CM

## 2020-05-27 DIAGNOSIS — F329 Major depressive disorder, single episode, unspecified: Secondary | ICD-10-CM

## 2020-05-27 DIAGNOSIS — F32A Depression, unspecified: Secondary | ICD-10-CM

## 2020-05-27 DIAGNOSIS — R7989 Other specified abnormal findings of blood chemistry: Secondary | ICD-10-CM

## 2020-05-27 LAB — PHOSPHORUS: Phosphorus: 4.6 mg/dL — ABNORMAL HIGH (ref 2.1–4.3)

## 2020-05-27 LAB — VITAMIN D 25 HYDROXY (VIT D DEFICIENCY, FRACTURES): Vit D, 25-Hydroxy: 44 ng/mL (ref 30–100)

## 2020-05-27 LAB — PTH, INTACT AND CALCIUM
Calcium: 8.7 mg/dL (ref 8.6–10.4)
PTH: 38 pg/mL (ref 14–64)

## 2020-05-27 NOTE — Progress Notes (Signed)
   Subjective:    Patient ID: Tara [name], female    DOB: 1956/05/17, 64 y.o.   MRN: [id number]  HPI 64 year old Female  who had some unexplained weight loss a few weeks ago. Had apparent viral syndrome fever and one episode of vomiting.  For some reason just stopped eating.  She says she had a fever of 100 degrees for 2 days and some diarrhea.  She was worried about COVID-19.  She went to [facility] September 11.  She says she had a 30 pound unintended weight loss.  At that time she weighed 224.62 pounds.  Her BMI was 35.24.  She weighed 240 pounds when she was seen here on September 17.  In the emergency department she was told that torsemide could have contributed to her weight loss.  Her potassium was 3 and her chloride was 88.  Her sodium was normal at 136.  Her creatinine was 1.24 and had been normal at 1.00 in May.  It seems that she was volume depleted and was given a liter of lactated Ringer's with 60 mEq of potassium chloride.  After that she felt better.  Her magnesium and TSH were normal in the emergency department.  We saw her here on September 17 and her calcium was for some reason low at 7.2.  Her albumin and total protein were normal.  Her urine was normal.  We are now checking her once again.  Her phosphorous is high at 4.6.  She has been taking some supplements part of the problem or not.  Her vitamin D is normal at 44.  Her calcium is low at 8.0.  Her PTH is normal at 38.  Her glucose is normal at 107.  BUN and creatinine are normal.  Sodium and potassium and chloride are normal.  Bicarb is normal at 28.  I think this might be due to supplements or some nutritional issues.  I think what we are going to do is simply repeat calcium and phosphorus in 4 weeks.    Review of Systems see Olga Millers shows me some supplement she got from the pharmacy and I think she should just discontinue these at the present time.     Objective:   Physical Exam Blood pressure 110/80 pulse  82 temperature 97.8 pulse oximetry 97% weight 238 pounds BMI 37.28  Says she feels better.  Chest clear.  Cardiac exam regular rate and rhythm.  No pitting edema of the lower extremities.  She is on thyroid replacement and on Metformin as well as Crestor.  Her TSH was normal on September 11.  She has regained her weight.  I think she can stay off of torsemide.  She has no pitting edema.     Assessment & Plan:   Hypocalcemia  Elevated phosphorus  Normal albumin and normal protein  Not sure why she has hypocalcemia and an elevated phosphorus.?  Due to supplements or nutritional issues.  Plan: I think we will repeat these labs in 4 weeks.

## 2020-05-28 ENCOUNTER — Other Ambulatory Visit: Payer: Self-pay | Admitting: Pediatrics

## 2020-05-28 LAB — PHOSPHORUS: Phosphorus: 4.4 mg/dL — ABNORMAL HIGH (ref 2.1–4.3)

## 2020-05-28 LAB — BASIC METABOLIC PANEL
BUN: 8 mg/dL (ref 7–25)
CO2: 28 mmol/L (ref 20–32)
Calcium: 8 mg/dL — ABNORMAL LOW (ref 8.6–10.4)
Chloride: 101 mmol/L (ref 98–110)
Creat: 0.97 mg/dL (ref 0.50–0.99)
Glucose, Bld: 107 mg/dL — ABNORMAL HIGH (ref 65–99)
Potassium: 4.7 mmol/L (ref 3.5–5.3)
Sodium: 139 mmol/L (ref 135–146)

## 2020-05-29 NOTE — Patient Instructions (Signed)
Avoid any extra supplements at the present time.  Eat regular food and follow-up in 4 weeks.

## 2020-06-06 ENCOUNTER — Telehealth: Payer: Self-pay | Admitting: Gastroenterology

## 2020-06-06 NOTE — Telephone Encounter (Signed)
Please schedule pt for endo/colon with Dr. Tarri Glenn as below.

## 2020-06-06 NOTE — Telephone Encounter (Signed)
Pt would like to know when Dr Tarri Glenn would like for her to have another colonoscopy done/

## 2020-06-06 NOTE — Telephone Encounter (Signed)
Yes. She may have both on the same day. Office follow-up also recommended given her advanced liver disease. Thanks.

## 2020-06-06 NOTE — Telephone Encounter (Signed)
Pt due for colon and EGD, she is wanting to know if Dr. Tarri Glenn will let her have both procedures on the same day. Please advise.

## 2020-06-07 ENCOUNTER — Other Ambulatory Visit: Payer: Self-pay | Admitting: Internal Medicine

## 2020-06-07 DIAGNOSIS — E559 Vitamin D deficiency, unspecified: Secondary | ICD-10-CM

## 2020-06-12 ENCOUNTER — Other Ambulatory Visit: Payer: Self-pay

## 2020-06-12 ENCOUNTER — Encounter: Payer: Self-pay | Admitting: Podiatry

## 2020-06-12 ENCOUNTER — Ambulatory Visit (INDEPENDENT_AMBULATORY_CARE_PROVIDER_SITE_OTHER): Payer: Medicare Other

## 2020-06-12 ENCOUNTER — Ambulatory Visit (INDEPENDENT_AMBULATORY_CARE_PROVIDER_SITE_OTHER): Payer: Medicare Other | Admitting: Podiatry

## 2020-06-12 DIAGNOSIS — S90111A Contusion of right great toe without damage to nail, initial encounter: Secondary | ICD-10-CM | POA: Diagnosis not present

## 2020-06-12 DIAGNOSIS — M779 Enthesopathy, unspecified: Secondary | ICD-10-CM

## 2020-06-14 NOTE — Progress Notes (Signed)
Subjective:   Patient ID: Tara [name], female   DOB: 64 y.o.   MRN: [id number]   HPI Patient presents with pain around the big toe joint right and states it was worked on by a pedicurist and seemed to get worse after that   ROS      Objective:  Physical Exam  Neurovascular status intact with inflammation around the first MPJ right with fluid buildup around the joint surface and pain to palpation      Assessment:  Appears to be inflammatory condition associated with pain of the big toe joint right with fluid buildup     Plan:  H&P reviewed condition and I went ahead did sterile prep and injected around the first MPJ 3 mg Dexasone Kenalog 5 mg Xylocaine to reduce inflammation and advised on anti-inflammatories.  Reappoint to recheck and encouraged to call with any issues were to occur  X-rays were negative for signs of bone injury or osteolysis or structural deformity

## 2020-06-24 ENCOUNTER — Other Ambulatory Visit: Payer: Self-pay

## 2020-06-24 ENCOUNTER — Other Ambulatory Visit: Payer: Medicare Other | Admitting: Internal Medicine

## 2020-06-24 DIAGNOSIS — E876 Hypokalemia: Secondary | ICD-10-CM

## 2020-06-24 DIAGNOSIS — E039 Hypothyroidism, unspecified: Secondary | ICD-10-CM

## 2020-06-24 DIAGNOSIS — R7989 Other specified abnormal findings of blood chemistry: Secondary | ICD-10-CM

## 2020-06-24 LAB — CALCIUM: Calcium: 8.9 mg/dL (ref 8.6–10.4)

## 2020-06-24 LAB — PHOSPHORUS: Phosphorus: 4.8 mg/dL — ABNORMAL HIGH (ref 2.1–4.3)

## 2020-06-24 LAB — MAGNESIUM: Magnesium: 2.2 mg/dL (ref 1.5–2.5)

## 2020-06-27 ENCOUNTER — Other Ambulatory Visit: Payer: Self-pay

## 2020-06-27 ENCOUNTER — Encounter: Payer: Self-pay | Admitting: Internal Medicine

## 2020-06-27 ENCOUNTER — Ambulatory Visit (INDEPENDENT_AMBULATORY_CARE_PROVIDER_SITE_OTHER): Payer: Medicare Other | Admitting: Internal Medicine

## 2020-06-27 DIAGNOSIS — F32A Depression, unspecified: Secondary | ICD-10-CM

## 2020-06-27 DIAGNOSIS — E039 Hypothyroidism, unspecified: Secondary | ICD-10-CM | POA: Diagnosis not present

## 2020-06-27 DIAGNOSIS — E1169 Type 2 diabetes mellitus with other specified complication: Secondary | ICD-10-CM

## 2020-06-27 DIAGNOSIS — F419 Anxiety disorder, unspecified: Secondary | ICD-10-CM

## 2020-06-27 DIAGNOSIS — E782 Mixed hyperlipidemia: Secondary | ICD-10-CM | POA: Diagnosis not present

## 2020-06-27 NOTE — Patient Instructions (Addendum)
Referral to [facility] for elevated serum  Phosphorus. Form completed for exercise program.

## 2020-06-27 NOTE — Progress Notes (Signed)
   Subjective:    Patient ID: Tara [name], female    DOB: October 10, 1955, 64 y.o.   MRN: [id number]  HPI 64 year old Female with hyperphosphatemia-etiology unclear to me.  History of morbid obesity, hypothyroidism, impaired glucose tolerance, GE reflux, allergic rhinitis, hyperlipidemia.  At one point she simply quit eating for several weeks and lost a lot of weight and became volume depleted.  She had apparent viral syndrome, fever and one episode of vomiting.  She went to [facility] September 11.  She stated she had had a 30 pound unintended weight loss.  At that time she weighed 224.62 pounds.  She had weight 240 pounds here when she was seen on September 17.  In the emergency department she was told that torsemide could have contributed to her weight loss.  Her potassium was three and her chloride was 88.  Her sodium was 136.  Her creatinine was 1.24 and had been normal at 1.00 in May of this year.  She was given a liter of lactated Ringer's with 60 mEq of potassium chloride and after that she felt better.  Her magnesium TSH were normal.  We saw her here on September 17 and her calcium for some reason was low at 7.2.  We found that her phosphorus was high on September 27 at 4.6.  She had been taking some supplements.  I asked her to discontinue all of those supplements.  Her vitamin D was normal at 44 her calcium was low at 8.0.  Her PTH was normal at 38.  Her glucose was normal at 107.  I did not know if this was due to supplements or some nutritional issues.  She is here today for follow-up.  She is asking for an exercise form to be completed so she can start working out with a Physiological scientist.  Form was reviewed and signed.  We checked her magnesium on October 25 and it is normal at 2.2.  Her calcium is normal at 8.9.  However, her phosphorus remains elevated at 4.8.  Creatinine on September 27 was normal at 0.97.  Creatinine was not checked with this visit.  She has a history of anxiety.   She is anxious about what is causing her elevated phosphorus.  Review of Systems see above no new complaints     Objective:   Physical Exam Blood pressure 120/80 pulse 85 temperature 97.7 pulse oximetry 96% weight is 235 pounds  She is in no acute distress but slightly anxious about elevated phosphorus.  Her husband is with her today.  Form completed for exercise with personal trainer as requested     Assessment & Plan:  Elevated serum phosphorus-etiology unclear.  She denies that she is taking any supplements at this point in time.  Her basic metabolic panel on September 27 was normal except for calcium of 8.0.  And was not repeated today.  BUN was 18 creatinine was 0.97.  Her calcium is now normal at 8.9.  Magnesium is normal at 2.2.  Phosphorus is 4.8 normal being up to 4.3.  Plan: I am going to refer her to [facility] for further evaluation.  She is agreeable with this plan.  Time spent with patient and form completion for exercise is 20 minutes. CPE due May 2022.

## 2020-06-28 LAB — MICROALBUMIN / CREATININE URINE RATIO
Creatinine, Urine: 45 mg/dL (ref 20–275)
Microalb Creat Ratio: 4 mcg/mg creat (ref <30)
Microalb, Ur: 0.2 mg/dL

## 2020-07-01 DIAGNOSIS — M5410 Radiculopathy, site unspecified: Secondary | ICD-10-CM | POA: Insufficient documentation

## 2020-07-01 HISTORY — DX: Radiculopathy, site unspecified: M54.10

## 2020-07-09 LAB — HM MAMMOGRAPHY

## 2020-07-10 ENCOUNTER — Encounter: Payer: Self-pay | Admitting: Internal Medicine

## 2020-07-16 ENCOUNTER — Other Ambulatory Visit: Payer: Self-pay

## 2020-07-16 ENCOUNTER — Ambulatory Visit (AMBULATORY_SURGERY_CENTER): Payer: Self-pay | Admitting: *Deleted

## 2020-07-16 VITALS — Ht 67.0 in | Wt 237.0 lb

## 2020-07-16 DIAGNOSIS — K7460 Unspecified cirrhosis of liver: Secondary | ICD-10-CM

## 2020-07-16 DIAGNOSIS — Z86010 Personal history of colonic polyps: Secondary | ICD-10-CM

## 2020-07-16 MED ORDER — SUTAB 1479-225-188 MG PO TABS: ORAL_TABLET | ORAL | 0 refills | Status: DC

## 2020-07-16 NOTE — Progress Notes (Signed)
covid vaccines completed and booster completed in September 2021  Pt is aware that care partner will wait in the car during procedure; if they feel like they will be too hot or cold to wait in the car; they may wait in the 4 th floor lobby. Patient is aware to bring only one care partner. We want them to wear a mask (we do not have any that we can provide them), practice social distancing, and we will check their temperatures when they get here.  I did remind the patient that their care partner needs to stay in the parking lot the entire time and have a cell phone available, we will call them when the pt is ready for discharge. Patient will wear mask into building.  Pt had one instance of having trouble waking up after surgery, no trouble with intubation per pt, fam hx/hx of malignant hyperthermia   No egg or soy allergy  No home oxygen use   No medications for weight loss taken  Pt denies constipation issues   Sutab code put into RX and paper copy given to pt to show pharmacy

## 2020-07-19 ENCOUNTER — Other Ambulatory Visit: Payer: Medicare Other | Admitting: Internal Medicine

## 2020-07-19 ENCOUNTER — Other Ambulatory Visit: Payer: Self-pay

## 2020-07-19 DIAGNOSIS — E1169 Type 2 diabetes mellitus with other specified complication: Secondary | ICD-10-CM

## 2020-07-20 LAB — LIPID PANEL
Cholesterol: 187 mg/dL (ref <200)
HDL: 69 mg/dL (ref >=50)
LDL Cholesterol (Calc): 97 mg/dL (calc)
Non-HDL Cholesterol (Calc): 118 mg/dL (calc) (ref <130)
Total CHOL/HDL Ratio: 2.7 (calc) (ref <5.0)
Triglycerides: 115 mg/dL (ref <150)

## 2020-07-20 LAB — TSH: TSH: 2.48 mIU/L (ref 0.40–4.50)

## 2020-07-20 LAB — HEPATIC FUNCTION PANEL
AG Ratio: 1.4 (calc) (ref 1.0–2.5)
ALT: 12 U/L (ref 6–29)
AST: 16 U/L (ref 10–35)
Albumin: 4.2 g/dL (ref 3.6–5.1)
Alkaline phosphatase (APISO): 43 U/L (ref 37–153)
Bilirubin, Direct: 0.2 mg/dL (ref 0.0–0.2)
Globulin: 3.1 g/dL (calc) (ref 1.9–3.7)
Indirect Bilirubin: 0.7 mg/dL (calc) (ref 0.2–1.2)
Total Bilirubin: 0.9 mg/dL (ref 0.2–1.2)
Total Protein: 7.3 g/dL (ref 6.1–8.1)

## 2020-07-20 LAB — HEMOGLOBIN A1C
Hgb A1c MFr Bld: 6 % of total Hgb — ABNORMAL HIGH (ref <5.7)
Mean Plasma Glucose: 126 (calc)
eAG (mmol/L): 7 (calc)

## 2020-07-22 ENCOUNTER — Encounter: Payer: Self-pay | Admitting: Internal Medicine

## 2020-07-22 ENCOUNTER — Other Ambulatory Visit: Payer: Self-pay

## 2020-07-22 ENCOUNTER — Ambulatory Visit (INDEPENDENT_AMBULATORY_CARE_PROVIDER_SITE_OTHER): Payer: Medicare Other | Admitting: Internal Medicine

## 2020-07-22 ENCOUNTER — Other Ambulatory Visit: Payer: Self-pay | Admitting: Internal Medicine

## 2020-07-22 VITALS — BP 120/88 | HR 77 | Ht 67.0 in | Wt 238.0 lb

## 2020-07-22 DIAGNOSIS — G894 Chronic pain syndrome: Secondary | ICD-10-CM | POA: Diagnosis not present

## 2020-07-22 DIAGNOSIS — M546 Pain in thoracic spine: Secondary | ICD-10-CM | POA: Diagnosis not present

## 2020-07-22 MED ORDER — HYDROCODONE-ACETAMINOPHEN 10-325 MG PO TABS: 1.0000 | ORAL_TABLET | Freq: Three times a day (TID) | ORAL | 0 refills | Status: AC | PRN

## 2020-07-22 NOTE — Progress Notes (Signed)
Subjective:    Patient ID: Tara [name], female    DOB: 1956-01-17, 64 y.o.   MRN: [id number]  HPI 64 year old Female seen today for 6 month follow up.  Today she is in a lot of pain and is asking for prescription for pain medication.  She has a history of complex regional pain syndrome/thoracic radiculopathy and saw Dr. Lavell Anchors in [place] in 2020 regarding this.  She has had an MRI of the thoracic spine showing mild to moderate stenosis T9-T10.  More recently has been getting almost monthly therapeutic injections per Dr. Ermalene Postin, radiologist in [facility] with Xylocaine but this is not helping anymore she says.  Has also tried Cymbalta for pain but quit taking it.  History of breast cancer while living in [place] in 2012.  Biopsy showed invasive ductal carcinoma grade 3 estrogen receptor positive/progesterone receptor positive/HER-2/neu positive.  Had left lumpectomy and sentinel node sampling December 2012.  Sentinel nodes were negative for tumor.  She had 4 cycles of adjuvant chemotherapy which was started in [place] in 2013 after she moved here from [facility].  She also received radiation treatment here.  Has been declared free of breast cancer.  History of impaired glucose tolerance.  History of longstanding obesity.  Went to healthy weight clinic but was not committed to follow through.  History of TTP when she was 64 years old.  Did not have splenectomy.  Says she had recurrence at age 75 while being treated with interferon for Hepatitis C.  History of bilateral hip replacements right one in 2005 and left 09/19/2005.  History of hypothyroidism and allergic rhinitis.  Depression has been treated with Celexa but currently not taking that either.  History of dependent edema.  Dr. Jana Hakim referred her to me in 2013 for primary care.  History of vitamin D deficiency.  She has a history of asthma and has seen allergist in the past  Recently found to have high phosphorus  level and is seeing nephrologist December 2 regarding this.  We have repeated this several times and values range from 4.4 to 4.8.     Had PET scan for question of enlarged lymph nodes in 2019 and that study was negative.  Hx hypothyroidism and TSH is stable, takes Prilosec for reflux, Takes low dose Crestor for hyperlipidemia.  History of impaired glucose tolerance.  Hemoglobin A1c drawn on November 19 is 6%.  Liver functions are normal.  TSH is normal on thyroid replacement medication.  Lipid panel is normal on Crestor.    Review of Systems having pain left upper thoracic area today. Cannot sit still during appointment due to discomfort.     Objective:   Physical Exam is very anxious and appears to be in extreme pain.  Having issues sitting still due to discomfort.  BP 120/88 but having considerable pain left posterior lateral thoracic area.  Chest is clear to auscultation.  Pulse oximetry is normal at 96% weight is 238 pounds BMI 37.28 blood pressure 120/88      Assessment & Plan:  Left thoracic radiculopathy previously treated monthly with Xylocaine injections per radiologist.  Seems to be having considerable pain now and says injections of Xylocaine not working.  Gave her Norco 10/325 (#15 tabs) and may take 1/2 to 1 tablet sparingly every 8 hours as needed for severe pain.  Advised her that we cannot refill this.  Recommend that she see neurosurgeon for re-evaluation of this issue.  Hyperphosphatemia-has appointment with nephrologist in  the near future  Impaired glucose tolerance-hemoglobin A1c stable at 6% on Metformin  Hyperlipidemia treated with Crestor 5 mg 3 times a week  History of GE reflux treated with Prilosec  Allergic rhinitis treated with Singulair and Astelin nasal spray as well as as needed albuterol inhaler  Depression-currently not on antidepressant medication.  Previously took Cymbalta which helped with pain control as well as depression.  Hypothyroidism  stable on levothyroxine 88 mcg daily.  Remote history of breast cancer  She will be referred to Neurosurgeon.  Health maintenance exam due here in 6 months.  Seeing nephrologist regarding hyperphosphatemia week of December 2.

## 2020-07-22 NOTE — Patient Instructions (Addendum)
Seeing nephrologist in the near future for hyperphosphatemia.  Referral to neurosurgeon regarding longstanding thoracic radiculopathy.  Given Norco 10/325 to take 1/2 to 1 tablet sparingly no more than every 8 hours for thoracic pain.  Return here in 6 months for health maintenance exam and Medicare wellness visit.

## 2020-07-23 ENCOUNTER — Other Ambulatory Visit: Payer: Self-pay | Admitting: Pediatrics

## 2020-07-30 ENCOUNTER — Encounter: Payer: Self-pay | Admitting: Allergy and Immunology

## 2020-07-30 ENCOUNTER — Other Ambulatory Visit: Payer: Self-pay

## 2020-07-30 ENCOUNTER — Ambulatory Visit (INDEPENDENT_AMBULATORY_CARE_PROVIDER_SITE_OTHER): Payer: Medicare Other | Admitting: Allergy and Immunology

## 2020-07-30 DIAGNOSIS — L2089 Other atopic dermatitis: Secondary | ICD-10-CM

## 2020-07-30 DIAGNOSIS — J3089 Other allergic rhinitis: Secondary | ICD-10-CM

## 2020-07-30 DIAGNOSIS — J4540 Moderate persistent asthma, uncomplicated: Secondary | ICD-10-CM | POA: Diagnosis not present

## 2020-07-30 NOTE — Assessment & Plan Note (Signed)
   Continue appropriate allergen avoidance measures.  Continue azelastine nasal spray, 1-2 sprays per nostril 2 times daily as needed.   Nasal saline lavage (NeilMed) has been recommended as needed and prior to medicated nasal sprays along with instructions for proper administration.  For thick post nasal drainage, add guaifenesin [phone] mg (Mucinex)  twice daily as needed with adequate hydration as discussed.

## 2020-07-30 NOTE — Progress Notes (Signed)
Follow-up Note  RE: Tara [name] MRN: [id number] DOB: 14-Nov-1955 Date of Office Visit: 07/30/2020  Primary care provider: [name], MD Referring provider: [name], MD  History of present illness: Tara [name] is a 64 y.o. female with asthma, allergic rhinitis, and reflux presenting today for follow-up.  She was last seen in this clinic on March 27, 2020.  She reports that her asthma has significantly improved after having been switched from Advair dry powder inhaler to Advair HFA with a spacer device.  Not only does she describe her asthma as "perfect' but she also reports that she no longer suffers from raspy voice.  She rarely requires albuterol rescue and denies limitations in normal daily activities and/or nocturnal awakenings due to lower respiratory symptoms.  Her nasal allergy symptoms are well controlled with the exception of occasional rhinorrhea in the morning.  She has been using nasal saline lavage with benefit.  She reports that her reflux is well controlled on omeprazole.  She does complain of a patch of eczema on her back but found one of her tubes of clobetasol 0.05% cream at home.  Assessment and plan: Moderate persistent asthma Improved and well controlled.  Continue Advair 115/21 g, 2 inhalations via spacer device twice daily.  Continue montelukast 10 mg daily at bedtime.  Continue albuterol HFA, 1 to 2 inhalations every 4-6 hours if needed.  The patient's progress will be followed and treatment plan will be adjusted accordingly.  Other allergic rhinitis  Continue appropriate allergen avoidance measures.  Continue azelastine nasal spray, 1-2 sprays per nostril 2 times daily as needed.   Nasal saline lavage (NeilMed) has been recommended as needed and prior to medicated nasal sprays along with instructions for proper administration.  For thick post nasal drainage, add guaifenesin [phone] mg (Mucinex)  twice daily as needed with adequate hydration as  discussed.  Atopic dermatitis  I have recommended Aquaphor to moisturize dry patches.  Clobetasol 0.05% ointment sparingly to affected area twice daily.  Do not use this medication for more than 2 weeks straight without taking a break of 2 weeks.   No orders of the defined types were placed in this encounter.   Diagnostics: Due to Covid precautions spirometry was not performed today has the patient's asthma is well controlled/stable and pulmonary exam was normal.    Physical examination: Blood pressure 124/72, pulse 84, temperature (!) 97.3 F (36.3 C), temperature source Oral, resp. rate 20, SpO2 97 %.  General: Alert, interactive, in no acute distress. HEENT: TMs pearly gray, turbinates minimally edematous without discharge, post-pharynx unremarkable. Neck: Supple without lymphadenopathy. Lungs: Clear to auscultation without wheezing, rhonchi or rales. CV: Normal S1, S2 without murmurs. Skin: Warm and dry, without lesions or rashes.  The following portions of the patient's history were reviewed and updated as appropriate: allergies, current medications, past family history, past medical history, past social history, past surgical history and problem list.  Current Outpatient Medications  Medication Sig Dispense Refill  . acetaminophen (TYLENOL) 500 MG tablet Take 500 mg by mouth every 6 (six) hours as needed for mild pain, moderate pain or headache.     [name] HFA 062-37 MCG/ACT inhaler     . albuterol (VENTOLIN HFA) 108 (90 Base) MCG/ACT inhaler Two puffs every 4 hours if needed for wheezing or coughing .  May use 2 puffs 5-15 minutes prior to exercise. 18 g 1  . azelastine (ASTELIN) 0.1 % nasal spray Place 1-2 sprays into both nostrils  2 (two) times daily as needed. 30 mL 5  . levothyroxine (SYNTHROID) 88 MCG tablet TAKE ONE TABLET BY MOUTH DAILY 90 tablet 1  . metFORMIN (GLUCOPHAGE) 500 MG tablet TAKE ONE TABLET BY MOUTH TWICE A DAY WITH A MEAL 180 tablet 2  . montelukast  (SINGULAIR) 10 MG tablet TAKE ONE TABLET BY MOUTH EVERY NIGHT AT BEDTIME 90 tablet 3  . omeprazole (PRILOSEC) 20 MG capsule TAKE ONE CAPSULE BY MOUTH TWICE A DAY 60 capsule 1  . rosuvastatin (CRESTOR) 5 MG tablet TAKE ONE TABLET BY MOUTH THREE TIMES A WEEK 36 tablet 3  . Sodium Sulfate-Mag Sulfate-KCl (SUTAB) [phone] MG TABS MANUFACTURER CODES!! BIN: [id number] PCN: CN GROUP: [id number] MEMBER ID: [id number];HER AS SECONDARY INSURANCE ;NO PRIOR AUTHORIZATION 24 tablet 0  . triamcinolone cream (KENALOG) 0.1 % Apply twice a day if needed to red itchy areas below the face. 80 g 5   No current facility-administered medications for this visit.    Allergies  Allergen Reactions  . Aspirin Other (See Comments)    Pt has a hx of TTP.   . Nsaids Other (See Comments)    Pt has a hx of TTP.     I appreciate the opportunity to take part in [facility] care. Please do not hesitate to contact me with questions.  Sincerely,   R. Edgar Frisk, MD

## 2020-07-30 NOTE — Assessment & Plan Note (Signed)
Improved and well controlled.  Continue Advair 115/21 g, 2 inhalations via spacer device twice daily.  Continue montelukast 10 mg daily at bedtime.  Continue albuterol HFA, 1 to 2 inhalations every 4-6 hours if needed.  The patient's progress will be followed and treatment plan will be adjusted accordingly.

## 2020-07-30 NOTE — Assessment & Plan Note (Signed)
   I have recommended Aquaphor to moisturize dry patches.  Clobetasol 0.05% ointment sparingly to affected area twice daily.  Do not use this medication for more than 2 weeks straight without taking a break of 2 weeks.

## 2020-07-30 NOTE — Patient Instructions (Addendum)
Moderate persistent asthma Improved and well controlled.  Continue Advair 115/21 g, 2 inhalations via spacer device twice daily.  Continue montelukast 10 mg daily at bedtime.  Continue albuterol HFA, 1 to 2 inhalations every 4-6 hours if needed.  The patient's progress will be followed and treatment plan will be adjusted accordingly.  Other allergic rhinitis  Continue appropriate allergen avoidance measures.  Continue azelastine nasal spray, 1-2 sprays per nostril 2 times daily as needed.   Nasal saline lavage (NeilMed) has been recommended as needed and prior to medicated nasal sprays along with instructions for proper administration.  For thick post nasal drainage, add guaifenesin [phone] mg (Mucinex)  twice daily as needed with adequate hydration as discussed.  Atopic dermatitis  I have recommended Aquaphor to moisturize dry patches.  Clobetasol 0.05% ointment sparingly to affected area twice daily.  Do not use this medication for more than 2 weeks straight without taking a break of 2 weeks.   Return in about 5 months (around 12/28/2020), or if symptoms worsen or fail to improve.

## 2020-08-05 ENCOUNTER — Ambulatory Visit
Admission: RE | Admit: 2020-08-05 | Discharge: 2020-08-05 | Disposition: A | Discharge status: Home / Self Care | Payer: Medicare Other | Source: Ambulatory Visit | Attending: Internal Medicine | Admitting: Internal Medicine

## 2020-08-05 ENCOUNTER — Other Ambulatory Visit: Payer: Self-pay

## 2020-08-05 DIAGNOSIS — M546 Pain in thoracic spine: Secondary | ICD-10-CM

## 2020-08-06 ENCOUNTER — Ambulatory Visit (INDEPENDENT_AMBULATORY_CARE_PROVIDER_SITE_OTHER): Payer: Medicare Other | Admitting: Internal Medicine

## 2020-08-06 ENCOUNTER — Other Ambulatory Visit: Payer: Self-pay

## 2020-08-06 ENCOUNTER — Encounter: Payer: Self-pay | Admitting: Internal Medicine

## 2020-08-06 VITALS — BP 110/70 | HR 70 | Temp 97.7°F | Ht 67.0 in | Wt 239.0 lb

## 2020-08-06 DIAGNOSIS — M4724 Other spondylosis with radiculopathy, thoracic region: Secondary | ICD-10-CM

## 2020-08-11 ENCOUNTER — Other Ambulatory Visit: Payer: Medicare Other

## 2020-08-12 ENCOUNTER — Ambulatory Visit (AMBULATORY_SURGERY_CENTER): Payer: Medicare Other | Admitting: Gastroenterology

## 2020-08-12 ENCOUNTER — Encounter: Payer: Self-pay | Admitting: Gastroenterology

## 2020-08-12 ENCOUNTER — Other Ambulatory Visit: Payer: Self-pay

## 2020-08-12 VITALS — BP 139/69 | HR 88 | Temp 97.0°F | Resp 18 | Ht 67.0 in | Wt 237.0 lb

## 2020-08-12 DIAGNOSIS — D124 Benign neoplasm of descending colon: Secondary | ICD-10-CM

## 2020-08-12 DIAGNOSIS — Z86010 Personal history of colonic polyps: Secondary | ICD-10-CM

## 2020-08-12 DIAGNOSIS — K7460 Unspecified cirrhosis of liver: Secondary | ICD-10-CM | POA: Diagnosis not present

## 2020-08-12 DIAGNOSIS — K5140 Inflammatory polyps of colon without complications: Secondary | ICD-10-CM

## 2020-08-12 DIAGNOSIS — K449 Diaphragmatic hernia without obstruction or gangrene: Secondary | ICD-10-CM

## 2020-08-12 DIAGNOSIS — D123 Benign neoplasm of transverse colon: Secondary | ICD-10-CM

## 2020-08-12 HISTORY — PX: COLONOSCOPY: SHX174

## 2020-08-12 HISTORY — PX: UPPER GASTROINTESTINAL ENDOSCOPY: SHX188

## 2020-08-12 MED ORDER — SODIUM CHLORIDE 0.9 % IV SOLN: 500.0000 mL | Freq: Once | INTRAVENOUS | Status: DC

## 2020-08-12 NOTE — Progress Notes (Signed)
Called to room to assist during endoscopic procedure.  Patient ID and intended procedure confirmed with present staff. Received instructions for my participation in the procedure from the performing physician.  

## 2020-08-12 NOTE — Progress Notes (Signed)
Pt's states no medical or surgical changes since previsit or office visit.  ° °Vitals CW °

## 2020-08-12 NOTE — Op Note (Addendum)
[facility] Patient Name: Tara [name] Procedure Date: 08/12/2020 8:47 AM MRN: [id number] Endoscopist: Thornton Park MD, MD Age: 64 Referring MD:  Date of Birth: [birth year] Gender: Female Account #: [id number] Procedure:                Upper GI endoscopy Indications:              Cirrhosis rule out esophageal varices Medicines:                Monitored Anesthesia Care Procedure:                Pre-Anesthesia Assessment:                           - Prior to the procedure, a History and Physical                            was performed, and patient medications and                            allergies were reviewed. The patient's tolerance of                            previous anesthesia was also reviewed. The risks                            and benefits of the procedure and the sedation                            options and risks were discussed with the patient.                            All questions were answered, and informed consent                            was obtained. Prior Anticoagulants: The patient has                            taken no previous anticoagulant or antiplatelet                            agents. ASA Grade Assessment: II - A patient with                            mild systemic disease. After reviewing the risks                            and benefits, the patient was deemed in                            satisfactory condition to undergo the procedure.                           After obtaining informed consent, the endoscope was  passed under direct vision. Throughout the                            procedure, the patient's blood pressure, pulse, and                            oxygen saturations were monitored continuously. The                            Endoscope was introduced through the mouth, and                            advanced to the third part of duodenum. The upper                            GI endoscopy was  accomplished without difficulty.                            The patient tolerated the procedure well. Scope In: Scope Out: Findings:                 The examined esophagus was normal. No esophageal                            varices.                           The stomach was normal except for a Hill Grade 2                            Hiatal Hernia.. No portal hypertensive gastropathy.                           The examined duodenum was normal. Complications:            No immediate complications. Estimated Blood Loss:     Estimated blood loss: none. Impression:               - Normal esophagus.                           - Normal stomach.                           - Hill Grade 2 hiatal hernia.                           - Normal examined duodenum.                           - No specimens collected. Recommendation:           - Patient has a contact number available for                            emergencies. The signs and symptoms of potential  delayed complications were discussed with the                            patient. Return to normal activities tomorrow.                            Written discharge instructions were provided to the                            patient.                           - Resume previous diet.                           - Continue present medications.                           - Repeat EGD in 2 years. Thornton Park MD, MD 08/12/2020 9:34:18 AM This report has been signed electronically.

## 2020-08-12 NOTE — Progress Notes (Signed)
0857 Robinul 0.1 mg IV given due large amount of secretions upon assessment.  MD made aware, vss

## 2020-08-12 NOTE — Op Note (Addendum)
[place] Patient Name: Tara [name] Procedure Date: 08/12/2020 8:47 AM MRN: [id number] Endoscopist: Thornton Park MD, MD Age: 64 Referring MD:  Date of Birth: 03-Apr-1956 Gender: Female Account #: [id number] Procedure:                Colonoscopy Indications:              Surveillance: History of piecemeal removal adenoma                            on last colonoscopy (< 3 yrs)                           19mm tubullovillous adenoma removed from the                            hepatic flexure in a piecemeal fashion 06/20/18                           60mm tubular adenoma removed from the same location                            in a piecemeal fashion 10/14/18                           19mm residual polyp removed from the same location                            01/26/19 Medicines:                Monitored Anesthesia Care Procedure:                Pre-Anesthesia Assessment:                           - Prior to the procedure, a History and Physical                            was performed, and patient medications and                            allergies were reviewed. The patient's tolerance of                            previous anesthesia was also reviewed. The risks                            and benefits of the procedure and the sedation                            options and risks were discussed with the patient.                            All questions were answered, and informed consent  was obtained. Prior Anticoagulants: The patient has                            taken no previous anticoagulant or antiplatelet                            agents. ASA Grade Assessment: II - A patient with                            mild systemic disease. After reviewing the risks                            and benefits, the patient was deemed in                            satisfactory condition to undergo the procedure.                           After obtaining  informed consent, the colonoscope                            was passed under direct vision. Throughout the                            procedure, the patient's blood pressure, pulse, and                            oxygen saturations were monitored continuously. The                            Colonoscope was introduced through the anus and                            advanced to the 3 cm into the ileum. The                            colonoscopy was performed without difficulty. The                            patient tolerated the procedure well. The quality                            of the bowel preparation was good. The terminal                            ileum, ileocecal valve, appendiceal orifice, and                            rectum were photographed. Scope In: 9:07:17 AM Scope Out: 9:27:16 AM Scope Withdrawal Time: 0 hours 15 minutes 56 seconds  Total Procedure Duration: 0 hours 19 minutes 59 seconds  Findings:                 The perianal and digital rectal examinations were  normal.                           A few small and large-mouthed diverticula were                            found in the sigmoid colon and descending colon.                           A 3 mm "possible" polyp was found in the descending                            colon. The polyp was carpet-like and was hard to                            distinguish from polypoid appearing colonic mucosa.                            The "polyp" was removed with a cold snare. The                            polyp was removed with a cold snare. Resection and                            retrieval were complete. Estimated blood loss was                            minimal.                           4 mm of residual polyp was found at the hepatic                            flexure in the area of a large tattoo. The polyp                            was removed in a piecemeal fashion with a cold                             snare. Resection and retrieval were complete.                            Estimated blood loss was minimal.                           The exam was otherwise without abnormality on                            direct and retroflexion views. Complications:            No immediate complications. Estimated blood loss:                            Minimal. Estimated Blood Loss:  Estimated blood loss was minimal. Impression:               - Diverticulosis in the sigmoid colon and in the                            descending colon.                           - One 3 mm possible polyp in the descending colon,                            removed with a cold snare. Resected and retrieved.                           - One 3 mm polyp at the hepatic flexure, removed                            with a cold snare. Resected and retrieved.                           - The examination was otherwise normal on direct                            and retroflexion views. Recommendation:           - Follow a high fiber diet. Drink at least 64                            ounces of water daily. Add a daily stool bulking                            agent such as psyllium (an exampled would be                            Metamucil).                           - Patient has a contact number available for                            emergencies. The signs and symptoms of potential                            delayed complications were discussed with the                            patient. Return to normal activities tomorrow.                            Written discharge instructions were provided to the                            patient.                           -  Continue present medications.                           - Await pathology results.                           - Repeat colonoscopy date to be determined after                            pending pathology results are reviewed for                             surveillance.                           - Emerging evidence supports eating a diet of                            fruits, vegetables, grains, calcium, and yogurt                            while reducing red meat and alcohol may reduce the                            risk of colon cancer. Thornton Park MD, MD 08/12/2020 9:43:02 AM This report has been signed electronically.

## 2020-08-12 NOTE — Patient Instructions (Signed)
Handouts on polyps & diverticulosis  given to you today  Await pathology results on polyps removed   Upper Endoscopy - normal - REPEAT Upper Endoscopy in 2 years  Make follow up office appointment with Dr Tarri Glenn   Follow a high fiber diet ( handout given ) drink at least 64 ozs water daily   Use psyllium such as Metamucil daily    YOU HAD AN ENDOSCOPIC PROCEDURE TODAY AT [facility]:   Refer to the procedure report that was given to you for any specific questions about what was found during the examination.  If the procedure report does not answer your questions, please call your gastroenterologist to clarify.  If you requested that your care partner not be given the details of your procedure findings, then the procedure report has been included in a sealed envelope for you to review at your convenience later.  YOU SHOULD EXPECT: Some feelings of bloating in the abdomen. Passage of more gas than usual.  Walking can help get rid of the air that was put into your GI tract during the procedure and reduce the bloating. If you had a lower endoscopy (such as a colonoscopy or flexible sigmoidoscopy) you may notice spotting of blood in your stool or on the toilet paper. If you underwent a bowel prep for your procedure, you may not have a normal bowel movement for a few days.  Please Note:  You might notice some irritation and congestion in your nose or some drainage.  This is from the oxygen used during your procedure.  There is no need for concern and it should clear up in a day or so.  SYMPTOMS TO REPORT IMMEDIATELY:   Following lower endoscopy (colonoscopy or flexible sigmoidoscopy):  Excessive amounts of blood in the stool  Significant tenderness or worsening of abdominal pains  Swelling of the abdomen that is new, acute  Fever of 100F or higher   Following upper endoscopy (EGD)  Vomiting of blood or coffee ground material  New chest pain or pain under the shoulder  blades  Painful or persistently difficult swallowing  New shortness of breath  Fever of 100F or higher  Black, tarry-looking stools  For urgent or emergent issues, a gastroenterologist can be reached at any hour by calling [phone]. Do not use MyChart messaging for urgent concerns.    DIET:  We do recommend a small meal at first, but then you may proceed to your regular diet.  Drink plenty of fluids but you should avoid alcoholic beverages for 24 hours.  ACTIVITY:  You should plan to take it easy for the rest of today and you should NOT DRIVE or use heavy machinery until tomorrow (because of the sedation medicines used during the test).    FOLLOW UP: Our staff will call the number listed on your records 48-72 hours following your procedure to check on you and address any questions or concerns that you may have regarding the information given to you following your procedure. If we do not reach you, we will leave a message.  We will attempt to reach you two times.  During this call, we will ask if you have developed any symptoms of COVID 19. If you develop any symptoms (ie: fever, flu-like symptoms, shortness of breath, cough etc.) before then, please call [phone].  If you test positive for Covid 19 in the 2 weeks post procedure, please call and report this information to [name].    If any  biopsies were taken you will be contacted by phone or by letter within the next 1-3 weeks.  Please call us at [phone] if you have not heard about the biopsies in 3 weeks.    SIGNATURES/CONFIDENTIALITY: You and/or your care partner have signed paperwork which will be entered into your electronic medical record.  These signatures attest to the fact that that the information above on your After Visit Summary has been reviewed and is understood.  Full responsibility of the confidentiality of this discharge information lies with you and/or your care-partner.

## 2020-08-12 NOTE — Progress Notes (Signed)
Report given to PACU, vss 

## 2020-08-14 ENCOUNTER — Telehealth: Payer: Self-pay

## 2020-08-14 ENCOUNTER — Telehealth: Payer: Self-pay | Admitting: *Deleted

## 2020-08-14 NOTE — Telephone Encounter (Signed)
No answer, left message to call back later today, B.Alejah Aristizabal RN. 

## 2020-08-14 NOTE — Telephone Encounter (Signed)
  Follow up Call-  Call back number 08/12/2020 01/26/2019 10/14/2018 06/20/2018  Post procedure Call Back phone  # [phone]  Permission to leave phone message Yes Yes Yes Yes  Some recent data might be hidden     No answer at 2nd attempt follow up phone call.  Left message on voicemail.

## 2020-08-30 NOTE — Progress Notes (Signed)
   Subjective:    Patient ID: Tara [name], female    DOB: [birth year], 64 y.o.   MRN: [id number]  HPI 64 year old Female seen with her husband in the office today to discuss recent MRI of thoracic spine with contrast done for persistent left thoracic back pain.  Findings include dextroscoliosis with associated mild multilevel facet hypertrophy throughout the thoracic spine most notable at T11-T12 on the left where there is resultant mild to moderate left foraminal stenosis which could contribute to her pain.  Minimal disc bulge at T3-T4, T7-T8 and T8-T9 without stenosis.  Tara [name] is noted deformity at superior endplate of T7 associated with mild central height loss.  No evidence of metastatic disease.  She is relieved to hear this.  Had been seeing Dr. Arletha Grippe in [facility] for left thoracic radiculopathy and received epidural steroid injections from time to time.  Was doing this on a monthly basis and was treated with Cymbalta.  Review of Systems see above     Objective:   Physical Exam  Blood pressure 110/70 pulse 70 temperature 97.7 pulse oximetry 96% weight 239 pounds BMI 37.43 (in May she weighed 189 pounds)      Assessment & Plan:  T11-T12 foraminal stenosis on the left likely contributing to chronic left thoracic pain.  No evidence of metastatic disease.  Plan: I would like to refer her to neurosurgery for evaluation.  She and her husband are agreeable to consultation with neurosurgeon.  Time spent with this discussion is 20 minutes.

## 2020-09-09 ENCOUNTER — Encounter: Payer: Self-pay | Admitting: Internal Medicine

## 2020-09-09 ENCOUNTER — Telehealth: Payer: Self-pay

## 2020-09-09 ENCOUNTER — Other Ambulatory Visit: Payer: Self-pay

## 2020-09-09 ENCOUNTER — Ambulatory Visit (INDEPENDENT_AMBULATORY_CARE_PROVIDER_SITE_OTHER): Payer: Medicare Other | Admitting: Internal Medicine

## 2020-09-09 VITALS — BP 120/72 | HR 82 | Temp 97.8°F | Ht 67.0 in | Wt 251.0 lb

## 2020-09-09 DIAGNOSIS — S2002XA Contusion of left breast, initial encounter: Secondary | ICD-10-CM | POA: Diagnosis not present

## 2020-09-09 DIAGNOSIS — M5414 Radiculopathy, thoracic region: Secondary | ICD-10-CM | POA: Diagnosis not present

## 2020-09-09 DIAGNOSIS — R635 Abnormal weight gain: Secondary | ICD-10-CM

## 2020-09-09 DIAGNOSIS — Z853 Personal history of malignant neoplasm of breast: Secondary | ICD-10-CM

## 2020-09-09 DIAGNOSIS — F411 Generalized anxiety disorder: Secondary | ICD-10-CM

## 2020-09-09 NOTE — Telephone Encounter (Signed)
Scheduled

## 2020-09-09 NOTE — Progress Notes (Signed)
Subjective:    Patient ID: Tara [name], female    DOB: 1956/07/06, 65 y.o.   MRN: [id number]  HPI  65 year old Female seen today regarding possible left breast mass.  On Thursday night, she felt hard lump in left breast. Does not remember an injury but noticed a contusion in her left lateral breast area.  Patient tried to get mammogram at [facility] but no appointment available unitil January 31st.apparently.Had mammogram November with biopsy and pathology was consistent with a scar.  Patient says that was in a separate area from where she is experiencing discomfort.  Regarding chronic left thoracic pain: She saw Tara [name] regarding chronic left lower thoracic pain which was felt to be radicular in nature.  Was felt to have foraminal stenosis at T11 and T12 on the left.  Also it was thought perhaps to have component of facet mediated pain at that level as well.  Option of left T11 selective nerve root block was offered.  Received epidural steroid injection T11-T12 on date of service.  Saw Tara [name] on December 9 for neurosurgical evaluation.  Was felt to have multilevel thoracic degeneration with some degree of degenerative and idiopathic thoracic lumbar scoliosis.  Found to have significant spondylosis on the left side T11-T12 with significant left-sided neuroforaminal stenosis.  Selective nerve root block was recommended and it was felt if that was diagnostic then they could consider decompressive foraminotomies in hopes of improving her symptoms.  It would seem that the injection did help her symptoms based on what she tells me today.  Patient had been receiving epidural steroid injections from time to time by Tara [name] in [facility].  She has also been treated with Cymbalta.  History of breast cancer while living in [place] in 2012.  She had a 10 mm suspicious abnormality on left mammogram.  Biopsy showed invasive ductal carcinoma grade 3 that was Estrogen receptor positive/Progesterone receptor  positive/HER2 positive.  Subsequently had left lumpectomy and sentinel node sampling December 2021.  Sentinel nodes were negative for tumor.  She had 4 cycles of adjuvant chemotherapy.  Chemotherapy was started in [place] in 2013 after she moved here from [facility].  She also received radiation treatment here.   Review of Systems see above-does not recall any injury to her left wrist area but has noticed a contused area and feels a hard lump in the same area.  Admits she is not following a low-fat diet.  Has been eating too many snacks.  Not exercising.  She wants to get back on diuretic for weight loss but I am not in favor of that.  She really does not have any pitting or edema to warrant that.     Objective:   Physical Exam  She weighed 238 pounds in November here and now weighs 251 pounds for 13 pound weight gain.  Pendulous breasts.  There is a contusion left lateral breast lower aspect.  There is an approximate 7 mm area that appears to be a superficial contusion with small nodule underneath.  No other lesions are noted in left breast.       Assessment & Plan:  Left inferiolateral breast contusion.  Patient was reassured that this is unlikely to be breast cancer however, she should keep appointment that she has with [facility] for later this month.  May apply warm hot compresses to the area.  T11-T12 radiculopathy-had diagnostic injection per Tara [name].  Patient reports that it did seem to help her pain.  Tara [name] recommended this procedure and said that patient could be a candidate for decompressive foraminotomy if there was improvement of pain  with diagnostic injection.  Anxiety state  Weight gain of 13 pounds since November-needs to work on diet and exercise.   Plan: Apply warm compresses to left breast contused area for 20 minutes 2 or 3 times daily.  Keep appointment with [facility] for mammogram at the end of the month.

## 2020-09-09 NOTE — Patient Instructions (Addendum)
I believe patient has a contusion of her left inferiolateral breast.  Recommended warm hot compresses.  She was reassured.  Advised her to keep appointment at [facility] at the end of the month.  Has gained 13 pounds since November.  Needs to work on diet exercise and weight loss.  Follow-up with Dr. Debbora Dus for thoracic radiculopathy.

## 2020-09-09 NOTE — Telephone Encounter (Signed)
OK 

## 2020-09-09 NOTE — Telephone Encounter (Signed)
Patient called on Thursday she noticed a hard knot on her right breast it is bruised and painful to touch. She said she is scared bc she had cancer in that breast before. She called [facility] for a mammogram but their next appointment is by the end of the month. She wants to know if she can come in to be seen?

## 2020-09-20 ENCOUNTER — Ambulatory Visit: Payer: Medicare Other | Admitting: Gastroenterology

## 2020-09-22 NOTE — Patient Instructions (Signed)
Discussion with patient and her husband regarding recent MRI study of the thoracic spine.  Patient will be referred to neurosurgery for opinion on how to proceed with treatment.

## 2020-09-25 ENCOUNTER — Other Ambulatory Visit: Payer: Self-pay

## 2020-09-25 ENCOUNTER — Encounter: Payer: Self-pay | Admitting: Gastroenterology

## 2020-09-25 ENCOUNTER — Other Ambulatory Visit (INDEPENDENT_AMBULATORY_CARE_PROVIDER_SITE_OTHER): Payer: Medicare Other

## 2020-09-25 ENCOUNTER — Ambulatory Visit (INDEPENDENT_AMBULATORY_CARE_PROVIDER_SITE_OTHER): Payer: Medicare Other | Admitting: Gastroenterology

## 2020-09-25 VITALS — BP 136/82 | HR 77 | Ht 66.0 in | Wt 247.0 lb

## 2020-09-25 DIAGNOSIS — R932 Abnormal findings on diagnostic imaging of liver and biliary tract: Secondary | ICD-10-CM | POA: Diagnosis not present

## 2020-09-25 DIAGNOSIS — K7030 Alcoholic cirrhosis of liver without ascites: Secondary | ICD-10-CM | POA: Diagnosis not present

## 2020-09-25 DIAGNOSIS — R112 Nausea with vomiting, unspecified: Secondary | ICD-10-CM

## 2020-09-25 LAB — CBC
HCT: 39.3 % (ref 36.0–46.0)
Hemoglobin: 13 g/dL (ref 12.0–15.0)
MCHC: 33.2 g/dL (ref 30.0–36.0)
MCV: 89.6 fl (ref 78.0–100.0)
Platelets: 148 10*3/uL — ABNORMAL LOW (ref 150.0–400.0)
RBC: 4.39 Mil/uL (ref 3.87–5.11)
RDW: 15 % (ref 11.5–15.5)
WBC: 5.4 10*3/uL (ref 4.0–10.5)

## 2020-09-25 LAB — PROTIME-INR
INR: 1 ratio (ref 0.8–1.0)
Prothrombin Time: 11.4 s (ref 9.6–13.1)

## 2020-09-25 LAB — COMPREHENSIVE METABOLIC PANEL
ALT: 10 U/L (ref 0–35)
AST: 14 U/L (ref 0–37)
Albumin: 4.4 g/dL (ref 3.5–5.2)
Alkaline Phosphatase: 42 U/L (ref 39–117)
BUN: 13 mg/dL (ref 6–23)
CO2: 28 mEq/L (ref 19–32)
Calcium: 9.2 mg/dL (ref 8.4–10.5)
Chloride: 100 mEq/L (ref 96–112)
Creatinine, Ser: 0.92 mg/dL (ref 0.40–1.20)
GFR: 65.09 mL/min (ref >60.00)
Glucose, Bld: 110 mg/dL — ABNORMAL HIGH (ref 70–99)
Potassium: 4 mEq/L (ref 3.5–5.1)
Sodium: 137 mEq/L (ref 135–145)
Total Bilirubin: 1.2 mg/dL (ref 0.2–1.2)
Total Protein: 7.9 g/dL (ref 6.0–8.3)

## 2020-09-25 MED ORDER — OMEPRAZOLE 20 MG PO CPDR: 20.0000 mg | DELAYED_RELEASE_CAPSULE | Freq: Two times a day (BID) | ORAL | 3 refills | Status: DC

## 2020-09-25 MED ORDER — FAMOTIDINE 20 MG PO TABS: 20.0000 mg | ORAL_TABLET | Freq: Two times a day (BID) | ORAL | 3 refills | Status: DC

## 2020-09-25 NOTE — Progress Notes (Signed)
Referring Provider: [name], MD Primary Care Physician:  [name], MD  Chief complaint:  Cirrhosis, nausea, vomiting   IMPRESSION:  Advanced fibrosis due to history of HCV +/- steatosis    - Elastography 04/28/2017: median velocity of 3.32 m/sec,         - corresponding to a fibrosis score of F3 to F4.    - has had the seasonal flu, Pneumovax, Twinrix, and Covid vaccines    - no history of decompensation, no varices on EGD 12/21 History of genotype 1a HCV with VL 1,370,000    - incomplete treatment with interferon due to associated TTP    - achieved SVR after 12 weeks of Harvoni Intermittent nausea, vomiting, and diarrhea    - not explained by EGD or imaging    - occurring monthly Stable portahepatis LAD Multiple stable liver cysts on prior abdominal imaging Normal, but low normal platelets History of colon polyps    - adenoma in 2007 on colonoscopy in [place]    - flexible sigmoidoscopy with [name] 2014    - large hepatic flexure TVA removed in piecemeal fashion requiring several exams    - concurrent TA, SSP, and inflammatory polyp removed in 2020 History of Schatzki's ring s/p dilation 2001  History of T1cN0 breast cancer Vitamin D deficiency  Advanced fibrosis noted on elastography prior to eradication of hepatitis C. Labs today to calculate a MELD score.  Will plan abdominal imaging every 6 months.  Given her history of complicated imaging with multiple liver cysts and lymphadenopathy, will alternate liver MRI with liver CT every 6 months. Continue with EGD for esophageal varices surveillance. Maintaining a healthy weight through diet and exercise is critical in the setting of suspected fatty liver. Abstinence from alcohol was recommended, as we discussed the ways that alcohol may accelerate the natural history of concurrent liver disease.   Recurrent nausea and vomiting, now occurring monthly.  There are no uniform features in the presentation making a  diagnosis difficult based on history alone. Source not identified on EGD or abdominal imaging.  Add famotidine as symptoms are occurring at night. PeptoBismal PRN.   PLAN: Labs to include: CMP, CBC, INR, AFP to allow calculation of a MELD score Continue omeprazole 20 mg BID Add famotidine 20 mg BID PeptoBismal PRN Colonoscopy 2024 EGD 2023 Vaccinations for HAV and HBV recommended (Twinrix) Abdominal imaging every 6 months - MRI now, abdominal CT with contrast 02/2021 Follow-up in the office every 6 months, earlier as needed Abstinence from alcohol Work to maintain a healthy weight through diet and regular exercise   HPI: Tara [name] is a 65 y.o. female returns in follow-up for cirrhosis. She was last seen in the office in 2019. Has had multiple endoscopic evaluation since that time, most recently 12/21.  The interval history is obtained through the patient, her husband who accompanies her to this appointment,  and review of her electronic health record.  She has cirrhosis due to prior HCV without history of decompensation. No symptoms at this time except for fatigue. No abdominal imaging since 2019.  She continues to have episodes of acute onset nausea and vomiting that occur once a month. Events are sporadic and unpredictable. Frequently has associated diarrhea but no abdominal pain.  Sometimes associated with eating out, square dancing, or overheating. She has tinnitus and wonders if it could be related. Used immodium for symptomatic relief with the last episode.   Recent labs: Labs from September 2021 and  November 2021 show normal sodium, creatinine 0.97, normal liver enzymes including a total bilirubin of 0.9, normal CBC with platelets of 176  Prior imaging studies: *Elastography 04/28/2017 median velocity of 3.32 m/sec, IQR 0.25, IQR/Median velocity ratio of 0.08 corresponding to a fibrosis score of F3 to F4. *MRI of the abdomen with and without contrast 05/21/2017 showed small  scattered T2 hyperintense liver lesions that were thought to be cysts or hematomas.  Mild hepatic steatosis.  Normal spleen.  Enlarged 1.6 cm portacaval node and an enlarged 1.0 cm porta hepatis node. *CT of the abdomen and pelvis with contrast 05/19/2018 showed multiple scattered tiny hypodensities throughout the liver similar in appearance to the prior MRI.  Otherwise normal liver pancreas and spleen.  Findings of small bowel enteritis. *CT of the chest with contrast 04/13/2018 showed well-circumscribed low-density liver lesions thought to be cysts.  Porta hepatis node of 1.5 cm.  Equal node measuring 2.07c *Right upper quadrant ultrasound 05/05/2018 shows a mildly echogenic liver and multiple small liver cysts.  No mass present. *PET scan 05/16/2018 shows a mildly hypermetabolic porta hepatis lymph node measuring 1.6cm. No other lesions identified.   Prior endoscopic procedures: *[facility] was [facility] in [facility] for achalasia and underwent dilation with a 60 [place] dilator.  Dysphasia patient had a hernia Schatzki's ring *Flexible sigmoidoscopy was performed by Dr. Elicia Lamp 04/20/2013 for chronic diarrhea or indications noted a history of a tubular adenoma on colonoscopy in 2007 in [place].  At the time of diarrhea stool studies and celiac profile was negative.  The examined mucosa was normal. Random biopsies were also normal.  * Colonoscopy 2019, 2020 and 2021 for peicemeal resection of a  68mm hepatic flexure tubulovillous adenomas, small tubular adenoma, diverticulosis  * Colonoscopy 2020 * Colonoscopy 2021: Diverticulosis, 4 mm of residual polyp at the hepatic flexure with pathology showing tubular adenoma, inflammatory polyp at the descending colon, sessile serrated polyp in the cecum * EGD 08/30/20: Grade 2 [place] hiatal hernia.  No portal hypertensive gastropathy, esophageal varices, or gastric varices.    Past Medical History:  Diagnosis Date  . Allergy    . Anxiety   . Arthritis   . Asthma   . Back pain   . Blood transfusion   . Breast cancer ([place]) 2012   left breast  . Chronic edema   . Cold hands and feet   . Complication of anesthesia    difficulty waking up/dizzy/lightheaded  . Cough   . Diabetes mellitus without complication ([place])    on Metformin-   . Dysrhythmia    irregular heartbeat, takes digoxin  . Environmental allergies   . Eye pain   . Fatigue   . GERD (gastroesophageal reflux disease)    occasionally   . H/O varicella   . H/O varicose veins   . Hepatic cirrhosis ([place])   . Hepatitis C   . History of chemotherapy 2013   left breast cancer  . History of measles, mumps, or rubella   . Hoarseness   . Hx of radiation therapy 12/15/11 - 01/29/12   left breast  . Hyperlipidemia   . Hypothyroidism   . Irregular heart beat    under control  . Joint pain   . Leg cramps   . Neuromuscular disorder ([place])    left foot nerve damage- neuropathy   . Osteopenia   . Palpitations   . Pre-diabetes   . Radiculopathy 07/2020  . Rheumatoid arthritis ([place])   .  Ringing in ear   . Shortness of breath   . Swallowing difficulty   . Swelling of both lower extremities   . Thrombocytopenia, primary ([place])   . TTP (thrombotic thrombocytopenic purpura)     Past Surgical History:  Procedure Laterality Date  . BREAST LUMPECTOMY  08/03/11   left lumpectomy and slnbx,T1cN0,triple pos  . CATARACT EXTRACTION, BILATERAL    . COLONOSCOPY  01/26/2019  . COLONOSCOPY  08/12/2020  . elbow pins Left   . FOOT SURGERY Left    multiple  . POLYPECTOMY    . PORTACATH PLACEMENT  08/03/2011   Procedure: INSERTION PORT-A-CATH;  Surgeon: Merrie Roof, MD;  Location: [facility];  Service: General;  Laterality: Right;  . portacath removal    . TOTAL HIP ARTHROPLASTY Bilateral   . UPPER GASTROINTESTINAL ENDOSCOPY    . UPPER GASTROINTESTINAL ENDOSCOPY  08/12/2020    Prior to Admission medications   Medication Sig Start Date End Date Taking?  Authorizing Provider  acetaminophen (TYLENOL) 500 MG tablet Take 500 mg by mouth every 6 (six) hours as needed for mild pain, moderate pain or headache.     [provider]  ADVAIR DISKUS 250-50 MCG/DOSE AEPB INHALE ONE PUFF BY MOUTH TWICE A DAY 05/30/18   [name], MD  albuterol (PROVENTIL HFA;VENTOLIN HFA) 108 (90 BASE) MCG/ACT inhaler Inhale 2 puffs into the lungs every 6 (six) hours as needed for wheezing or shortness of breath.     [provider]  cholecalciferol (VITAMIN D) 1000 UNITS tablet Take 3,000 Units by mouth daily.     [provider]  cyclobenzaprine (FLEXERIL) 10 MG tablet Take 1 tablet (10 mg total) by mouth 3 (three) times daily as needed for muscle spasms. 12/02/16   [name], MD  DIGOX 250 MCG tablet TAKE 1 TABLET BY MOUTH DAILY 12/03/17   [name], MD  DULoxetine (CYMBALTA) 60 MG capsule TAKE ONE CAPSULE BY MOUTH DAILY 05/03/18   [name], MD  fluticasone (FLONASE) 50 MCG/ACT nasal spray Place 2 sprays into both nostrils daily as needed for rhinitis.    [provider]  gabapentin (NEURONTIN) 300 MG capsule Take 1 capsule (300 mg total) by mouth 2 (two) times daily. 03/17/18   [name], MD  glucosamine-chondroitin 500-400 MG tablet Take 1 tablet by mouth daily.     [provider]  HYDROcodone-acetaminophen (NORCO) 5-325 MG tablet Take 1 tablet by mouth every 6 (six) hours as needed for moderate pain. 03/17/18   [name], MD  levofloxacin (LEVAQUIN) 500 MG tablet Take 1 tablet (500 mg total) by mouth daily. 01/28/18   [name], MD  levothyroxine (SYNTHROID, LEVOTHROID) 88 MCG tablet TAKE ONE TABLET BY MOUTH DAILY 05/24/18   [name], MD  magnesium oxide (MAG-OX) 400 MG tablet Take 400 mg by mouth daily.    [provider]  metFORMIN (GLUCOPHAGE) 500 MG tablet TAKE ONE TABLET BY MOUTH TWO TIMES A DAY WITH A MEAL 02/17/18   [name], MD  montelukast (SINGULAIR) 10 MG tablet TAKE ONE  TABLET (10 MG) BY MOUTH AT BEDTIME. 05/09/18   [name], MD  Multiple Vitamin (MULTIVITAMIN WITH MINERALS) TABS tablet Take 1 tablet by mouth daily.    [provider]  ondansetron (ZOFRAN ODT) 4 MG disintegrating tablet 4mg  ODT q4 hours prn nausea/vomit 03/18/18   Deno Etienne, DO  potassium chloride SA (K-DUR,KLOR-CON) 20 MEQ tablet Take 1 tablet (20 mEq  total) by mouth daily. 11/15/17   [name], MD  predniSONE (DELTASONE) 10 MG tablet Take in tapering course as directed 6-5-4-3-2-1 01/28/18   [name], MD  torsemide (DEMADEX) 20 MG tablet TAKE ONE TABLET BY MOUTH EVERY DAY. 05/30/18   [name], MD  vitamin B-12 (CYANOCOBALAMIN) 1000 MCG tablet Take 2,000 mcg by mouth daily.    [provider]    Current Outpatient Medications  Medication Sig Dispense Refill  . acetaminophen (TYLENOL) 500 MG tablet Take 500 mg by mouth every 6 (six) hours as needed for mild pain, moderate pain or headache.     [name] HFA 094-70 MCG/ACT inhaler     . albuterol (VENTOLIN HFA) 108 (90 Base) MCG/ACT inhaler Two puffs every 4 hours if needed for wheezing or coughing .  May use 2 puffs 5-15 minutes prior to exercise. 18 g 1  . azelastine (ASTELIN) 0.1 % nasal spray Place 1-2 sprays into both nostrils 2 (two) times daily as needed. 30 mL 5  . levothyroxine (SYNTHROID) 88 MCG tablet TAKE ONE TABLET BY MOUTH DAILY 90 tablet 1  . metFORMIN (GLUCOPHAGE) 500 MG tablet TAKE ONE TABLET BY MOUTH TWICE A DAY WITH A MEAL 180 tablet 2  . montelukast (SINGULAIR) 10 MG tablet TAKE ONE TABLET BY MOUTH EVERY NIGHT AT BEDTIME 90 tablet 3  . omeprazole (PRILOSEC) 20 MG capsule TAKE ONE CAPSULE BY MOUTH TWICE A DAY 60 capsule 1  . rosuvastatin (CRESTOR) 5 MG tablet TAKE ONE TABLET BY MOUTH THREE TIMES A WEEK 36 tablet 3  . triamcinolone cream (KENALOG) 0.1 % Apply twice a day if needed to red itchy areas below the face. 80 g 5   No current facility-administered medications for this visit.     Allergies as of 09/25/2020 - Review Complete 09/09/2020  Allergen Reaction Noted  . Aspirin Other (See Comments) 07/08/2011  . Nsaids Other (See Comments) 07/21/2011    Family History  Problem Relation Age of Onset  . Pneumonia Mother   . COPD Mother   . Colon polyps Mother   . Heart disease Mother   . Thyroid disease Mother   . Obesity Mother   . COPD Father   . Obesity Father   . Brain cancer Maternal Grandfather   . Alcohol abuse Maternal Grandfather   . Hyperlipidemia Neg Hx   . Hypertension Neg Hx   . Colon cancer Neg Hx   . Rectal cancer Neg Hx   . Stomach cancer Neg Hx   . Esophageal cancer Neg Hx     Social History   Socioeconomic History  . Marital status: Married    Spouse name: Octavia Bruckner  . Number of children: Not on file  . Years of education: Not on file  . Highest education level: Not on file  Occupational History  . Occupation: Retired  Tobacco Use  . Smoking status: Never Smoker  . Smokeless tobacco: Never Used  Vaping Use  . Vaping Use: Never used  Substance and Sexual Activity  . Alcohol use: Yes    Alcohol/week: 1.0 standard drink    Types: 1 Glasses of wine per week    Comment: occ  . Drug use: No  . Sexual activity: Not on file  Other Topics Concern  . Not on file  Social History Narrative  . Not on file   Social Determinants of Health   Financial Resource Strain: Not on file  Food Insecurity: Not on file  Transportation Needs:  Not on file  Physical Activity: Not on file  Stress: Not on file  Social Connections: Not on file  Intimate Partner Violence: Not on file     Physical Exam: General:   Alert, in NAD. No bilateral temporal wasting. Appears her stated age.  HEENT: No scleral icterus. Symmteric. Neck: No LAD, no thyromegaly Heart:  Regular rate and rhythm; no murmurs Pulm: Clear anteriorly; no wheezing Abdomen:  Soft. Central obesity. Nontender. Nondistended. Normal bowel sounds. No rebound or guarding. No fluid wave.   LAD: No inguinal or umbilical LAD Extremities:  Without edema. Neurologic:  Alert and  oriented x4;  grossly normal neurologically; no asterixis or clonus. Skin: No jaundice. No palmar erythema or spider angioma. + Terry's nails. Psych:  Alert and cooperative. Normal mood and affect.  Thornton Park, MD, MPH [facility]

## 2020-09-25 NOTE — Patient Instructions (Addendum)
We discussed strategies for minimizing your nausea. Please continue to take omeprazole 20 mg twice daily. Start taking famotidine 20 mg twice daily. These can be taken at the same time.  VACCINATIONS:   Please follow up with your primary care provider about receiving your HAV and HBV vaccinations (aka: Twinrix)  PRESCRIPTION MEDICATION(S):   We have sent the following medication(s) to your pharmacy:  . Omeprazole - please continue to take 20mg  by mouth twice daily . Famotidine - Please take 20mg  by mouth twice daily  . NOTE: If your medication(s) requires a PRIOR AUTHORIZATION, we will receive notification from your pharmacy. Once received, the process to submit for approval may take up to 7-10 business days. You will be contacted about any denials we have received from your insurance company as well as alternatives recommended by your provider.  You can try PeptoBismal for times when the symptoms are worse. Take bismuth salicylate (Pepto-Bismol) 30 mL or two tablets every 30 minutes for up to eight doses. Pepto-Bismol may make your stools black.  It is time for labs and imaging to follow-up on your liver.   MRI:  You have been scheduled for an MRI at [facility] (1st floor of the hospital) on 10/04/20 at 9am. Please arrive @ 8:30am for registration.   PREP:   Please DO NOT eat or drink anything 4 hours prior to your test. In addition, if you have any metal in your body, a pacemaker or a defibrillator, please inform your ordering provider. The test typically takes 45 minutes to 1 hour to complete.  NEED TO RESCHEDULE?   Please call radiology at [phone].  WHY ARE YOU HAVING THIS EXAM? To evaluate the live lesions  LABS:   Your provider has requested that you go to the basement level for lab work before leaving today. Press "B" on the elevator. The lab is located at the first door on the left as you exit the elevator.  HEALTHCARE LAWS AND MY CHART RESULTS: Due to  recent changes in healthcare laws, you may see the results of your imaging and laboratory studies on MyChart before your provider has had a chance to review them.  We understand that in some cases there may be results that are confusing or concerning to you. Not all laboratory results come back in the same time frame and the provider may be waiting for multiple results in order to interpret others.  Please give [name] 48 hours in order for your provider to thoroughly review all the results before contacting the office for clarification of your results.   BMI:  If you are age 66 or older, your body mass index should be between 23-30. Your Body mass index is 39.87 kg/m. If this is out of the aforementioned range listed, please consider follow up with your Primary Care Provider.  FOLLOW UP:  Please call the office at [phone] to schedule your follow up appointment with me in 6 months.   Thank you for trusting me with your gastrointestinal care!    Thornton Park, MD, MPH

## 2020-09-26 LAB — AFP TUMOR MARKER: AFP-Tumor Marker: 3.4 ng/mL

## 2020-09-30 ENCOUNTER — Other Ambulatory Visit: Payer: Self-pay

## 2020-09-30 ENCOUNTER — Encounter: Payer: Self-pay | Admitting: Internal Medicine

## 2020-09-30 DIAGNOSIS — Z853 Personal history of malignant neoplasm of breast: Secondary | ICD-10-CM

## 2020-10-04 ENCOUNTER — Ambulatory Visit (HOSPITAL_COMMUNITY)
Admission: RE | Admit: 2020-10-04 | Discharge: 2020-10-04 | Disposition: A | Discharge status: Home / Self Care | Payer: Medicare Other | Source: Ambulatory Visit | Attending: Gastroenterology | Admitting: Gastroenterology

## 2020-10-04 ENCOUNTER — Other Ambulatory Visit: Payer: Self-pay

## 2020-10-04 DIAGNOSIS — R932 Abnormal findings on diagnostic imaging of liver and biliary tract: Secondary | ICD-10-CM | POA: Diagnosis present

## 2020-10-04 DIAGNOSIS — K7030 Alcoholic cirrhosis of liver without ascites: Secondary | ICD-10-CM | POA: Diagnosis not present

## 2020-10-04 MED ORDER — GADOXETATE DISODIUM 0.25 MMOL/ML IV SOLN
10.0000 mL | Freq: Once | INTRAVENOUS | Status: AC | PRN
  Administered 2020-10-04: 10 mL via INTRAVENOUS

## 2020-10-16 ENCOUNTER — Other Ambulatory Visit: Payer: Self-pay | Admitting: Allergy and Immunology

## 2020-10-28 ENCOUNTER — Other Ambulatory Visit: Payer: Self-pay

## 2020-10-28 DIAGNOSIS — R112 Nausea with vomiting, unspecified: Secondary | ICD-10-CM

## 2020-11-18 ENCOUNTER — Ambulatory Visit (HOSPITAL_COMMUNITY)
Admission: RE | Admit: 2020-11-18 | Discharge: 2020-11-18 | Disposition: A | Discharge status: Home / Self Care | Payer: Medicare Other | Source: Ambulatory Visit | Attending: Gastroenterology | Admitting: Gastroenterology

## 2020-11-18 ENCOUNTER — Other Ambulatory Visit: Payer: Self-pay

## 2020-11-18 DIAGNOSIS — R112 Nausea with vomiting, unspecified: Secondary | ICD-10-CM | POA: Insufficient documentation

## 2020-11-18 MED ORDER — TECHNETIUM TC 99M SULFUR COLLOID
2.0000 | Freq: Once | INTRAVENOUS | Status: AC | PRN
  Administered 2020-11-18: 2 via INTRAVENOUS

## 2020-11-21 ENCOUNTER — Other Ambulatory Visit: Payer: Self-pay

## 2020-11-21 DIAGNOSIS — K3184 Gastroparesis: Secondary | ICD-10-CM

## 2020-12-02 ENCOUNTER — Encounter: Payer: Self-pay | Admitting: Gastroenterology

## 2020-12-02 ENCOUNTER — Ambulatory Visit (INDEPENDENT_AMBULATORY_CARE_PROVIDER_SITE_OTHER): Payer: Medicare Other | Admitting: Gastroenterology

## 2020-12-02 VITALS — BP 118/82 | HR 88 | Ht 66.0 in | Wt 252.5 lb

## 2020-12-02 DIAGNOSIS — K7030 Alcoholic cirrhosis of liver without ascites: Secondary | ICD-10-CM | POA: Diagnosis not present

## 2020-12-02 NOTE — Progress Notes (Addendum)
Referring Provider: [name], MD Primary Care Physician:  [name], MD  Chief complaint:  Cirrhosis, nausea, vomiting   IMPRESSION:  Advanced fibrosis due to history of HCV +/- steatosis    - Elastography 04/28/2017: median velocity of 3.32 m/sec,         - corresponding to a fibrosis score of F3 to F4.    - has had the seasonal flu, Pneumovax, Twinrix, and Covid vaccines    - no history of decompensation, no varices on EGD 12/21 History of genotype 1a HCV with VL 1,370,000    - incomplete treatment with interferon due to associated TTP    - achieved SVR after 12 weeks of Har[place] Gastroparesis presenting with intermittent nausea, vomiting, and diarrhea    - occurring monthly    - GES showed delayed gastric emptying 11/18/20 Stable portahepatis LAD Multiple stable liver cysts on prior abdominal imaging Complex kidney cyst on MRI 10/04/20 Normal, but low normal platelets History of colon polyps    - adenoma in 2007 on colonoscopy in [place]    - flexible sigmoidoscopy with [name] 2014    - large hepatic flexure TVA removed in piecemeal fashion requiring several exams    - concurrent TA, SSP, and inflammatory polyp removed in 2020 History of Schatzki's ring s/p dilation 2001  History of T1cN0 breast cancer Vitamin D deficiency  Advanced fibrosis noted on elastography prior to eradication of hepatitis C. Labs show a MELD of 7. Continue abdominal imaging every 6 months.  Given her history of complicated imaging with multiple liver cysts and lymphadenopathy, will alternate liver MRI with liver CT every 6 months. Continue with EGD for esophageal varices surveillance. Maintaining a healthy weight through diet and exercise is critical in the setting of suspected fatty liver. Abstinence from alcohol was recommended, as we discussed the ways that alcohol may accelerate the natural history of concurrent liver disease.   Gastroparesis: Reviewed new diagnosis. Recommended a low  residue, low fiber, low fat diet. Recommended multiple small meals in a day with a grazing type eating. She should separate solids and liquids. Drink only sips during meals and hydrate with 4 ounces of water at a time inbetween meals.  Continue famotidine as symptoms are occurring at night. PeptoBismal PRN.   Complex kidney cyst on recent MRI: Discussed with Dr. Renold Genta through [place] [facility]. Will refer to Urology. Plan follow-up MRI in 3-6 months, earlier with new symptoms.    PLAN: Labs on next visit to include: CMP, CBC, INR, AFP to allow calculation of a MELD score Continue omeprazole 20 mg BID Resume famotidine 20 mg BID PeptoBismal PRN Colonoscopy 2024 EGD 2023 Vaccinations for HAV and HBV recommended (Twinrix) MRI 8/22 to for [facility] screening and to follow-up kidney cyst Follow-up in the office every 6 months, earlier as needed Abstinence from alcohol Work to maintain a healthy weight through diet and regular exercise  I spent 40 minutes, including in depth chart review, independent review of results, communicating results with the patient directly, face-to-face time with the patient, coordinating care, ordering studies and medications as appropriate, and documentation.  HPI: Tara [name] is a 65 y.o. female returns in follow-up for cirrhosis. The interval history is obtained through the patient, her husband who accompanies her to this appointment, and review of her electronic health record.  She has cirrhosis due to prior HCV without history of decompensation. No symptoms at this time except for fatigue.   She has been under evaluation for episodes  of acute onset nausea and vomiting that occur once a month. Events are sporadic and unpredictable. Frequently has associated diarrhea but no abdominal pain.  Sometimes associated with eating out, square dancing, or overheating. She has tinnitus and wonders if it could be related. Used immodium for symptomatic relief with the last episode.    Gastric emptying scan + for gastroparesis with abnormal emptying at 3 and 4 hours.  Started a gastroparesis diet at that time.  No vomiting since 11/13/20. She was confused about the dosing of famotidine.   No new complaints or concerns.    Recent labs: Labs from January 2022 show a normal CMP except for glucose 110, normal CBC except for platelets of 148, INR 1.0, AFP 3.4 for a calculated MELD of 7.   Prior imaging studies: *Elastography 04/28/2017 median velocity of 3.32 m/sec, IQR 0.25, IQR/Median velocity ratio of 0.08 corresponding to a fibrosis score of F3 to F4. *MRI of the abdomen with and without contrast 05/21/2017 showed small scattered T2 hyperintense liver lesions that were thought to be cysts or hematomas.  Mild hepatic steatosis.  Normal spleen.  Enlarged 1.6 cm portacaval node and an enlarged 1.0 cm porta hepatis node. *CT of the abdomen and pelvis with contrast 05/19/2018 showed multiple scattered tiny hypodensities throughout the liver similar in appearance to the prior MRI.  Otherwise normal liver pancreas and spleen.  Findings of small bowel enteritis. *CT of the chest with contrast 04/13/2018 showed well-circumscribed low-density liver lesions thought to be cysts.  Porta hepatis node of 1.5 cm.  Equal node measuring 2.07c *Right upper quadrant ultrasound 05/05/2018 shows a mildly echogenic liver and multiple small liver cysts.  No mass present. *PET scan 05/16/2018 shows a mildly hypermetabolic porta hepatis lymph node measuring 1.6cm. No other lesions identified.  *MRI 10/04/20 showed a decrease in the size of a porta hepatis lymph node, complex cystic kidney lesion, and no metastatic disease in the abdomen. Follow-up MRI recommended in 3-6 months. Multiple small liver cysts.   Prior endoscopic procedures: *[facility] for achalasia and underwent dilation with a 60 [place] dilator.  Dysphasia patient had a hernia Schatzki's  ring *Flexible sigmoidoscopy was performed by Dr. Elicia Lamp 04/20/2013 for chronic diarrhea or indications noted a history of a tubular adenoma on colonoscopy in 2007 in [place].  At the time of diarrhea stool studies and celiac profile was negative.  The examined mucosa was normal. Random biopsies were also normal.  * Colonoscopy 2019, 2020 and 2021 for peicemeal resection of a  73mm hepatic flexure tubulovillous adenomas, small tubular adenoma, diverticulosis  * Colonoscopy 2020 * Colonoscopy 2021: Diverticulosis, 4 mm of residual polyp at the hepatic flexure with pathology showing tubular adenoma, inflammatory polyp at the descending colon, sessile serrated polyp in the cecum * EGD 08/30/20: Grade 2 Hill hiatal hernia.  No portal hypertensive gastropathy, esophageal varices, or gastric varices.    Past Medical History:  Diagnosis Date  . Allergy   . Anxiety   . Arthritis   . Asthma   . Back pain   . Blood transfusion   . Breast cancer ([place]) 2012   left breast  . Chronic edema   . Cold hands and feet   . Complication of anesthesia    difficulty waking up/dizzy/lightheaded  . Cough   . Diabetes mellitus without complication ([place])    on Metformin-   . Dysrhythmia    irregular heartbeat, takes digoxin  .  Environmental allergies   . Eye pain   . Fatigue   . GERD (gastroesophageal reflux disease)    occasionally   . H/O varicella   . H/O varicose veins   . Hepatic cirrhosis ([place])   . Hepatitis C   . History of chemotherapy 2013   left breast cancer  . History of measles, mumps, or rubella   . Hoarseness   . Hx of radiation therapy 12/15/11 - 01/29/12   left breast  . Hyperlipidemia   . Hypothyroidism   . Irregular heart beat    under control  . Joint pain   . Leg cramps   . Neuromuscular disorder ([place])    left foot nerve damage- neuropathy   . Osteopenia   . Palpitations   . Pre-diabetes   . Radiculopathy 07/2020  . Rheumatoid arthritis ([address])   . Ringing in ear   .  Shortness of breath   . Swallowing difficulty   . Swelling of both lower extremities   . Thrombocytopenia, primary ([place])   . TTP (thrombotic thrombocytopenic purpura)     Past Surgical History:  Procedure Laterality Date  . BREAST LUMPECTOMY  08/03/11   left lumpectomy and slnbx,T1cN0,triple pos  . CATARACT EXTRACTION, BILATERAL    . COLONOSCOPY  01/26/2019  . COLONOSCOPY  08/12/2020  . elbow pins Left   . FOOT SURGERY Left    multiple  . POLYPECTOMY    . PORTACATH PLACEMENT  08/03/2011   Procedure: INSERTION PORT-A-CATH;  Surgeon: Merrie Roof, MD;  Location: [facility];  Service: General;  Laterality: Right;  . portacath removal    . TOTAL HIP ARTHROPLASTY Bilateral   . UPPER GASTROINTESTINAL ENDOSCOPY    . UPPER GASTROINTESTINAL ENDOSCOPY  08/12/2020    Prior to Admission medications   Medication Sig Start Date End Date Taking? Authorizing Provider  acetaminophen (TYLENOL) 500 MG tablet Take 500 mg by mouth every 6 (six) hours as needed for mild pain, moderate pain or headache.     [provider]  ADVAIR DISKUS 250-50 MCG/DOSE AEPB INHALE ONE PUFF BY MOUTH TWICE A DAY 05/30/18   [name], MD  albuterol (PROVENTIL HFA;VENTOLIN HFA) 108 (90 BASE) MCG/ACT inhaler Inhale 2 puffs into the lungs every 6 (six) hours as needed for wheezing or shortness of breath.     [provider]  cholecalciferol (VITAMIN D) 1000 UNITS tablet Take 3,000 Units by mouth daily.     [provider]  cyclobenzaprine (FLEXERIL) 10 MG tablet Take 1 tablet (10 mg total) by mouth 3 (three) times daily as needed for muscle spasms. 12/02/16   [name], MD  DIGOX 250 MCG tablet TAKE 1 TABLET BY MOUTH DAILY 12/03/17   [name], MD  DULoxetine (CYMBALTA) 60 MG capsule TAKE ONE CAPSULE BY MOUTH DAILY 05/03/18   [name], MD  fluticasone (FLONASE) 50 MCG/ACT nasal spray Place 2 sprays into both nostrils daily as needed for rhinitis.    [provider]  gabapentin  (NEURONTIN) 300 MG capsule Take 1 capsule (300 mg total) by mouth 2 (two) times daily. 03/17/18   [name], MD  glucosamine-chondroitin 500-400 MG tablet Take 1 tablet by mouth daily.     [provider]  HYDROcodone-acetaminophen (NORCO) 5-325 MG tablet Take 1 tablet by mouth every 6 (six) hours as needed for moderate pain. 03/17/18   [name], MD  levofloxacin (LEVAQUIN) 500 MG tablet Take 1 tablet (500 mg total) by  mouth daily. 01/28/18   [name], MD  levothyroxine (SYNTHROID, LEVOTHROID) 88 MCG tablet TAKE ONE TABLET BY MOUTH DAILY 05/24/18   [name], MD  magnesium oxide (MAG-OX) 400 MG tablet Take 400 mg by mouth daily.    [provider]  metFORMIN (GLUCOPHAGE) 500 MG tablet TAKE ONE TABLET BY MOUTH TWO TIMES A DAY WITH A MEAL 02/17/18   [name], MD  montelukast (SINGULAIR) 10 MG tablet TAKE ONE TABLET (10 MG) BY MOUTH AT BEDTIME. 05/09/18   [name], MD  Multiple Vitamin (MULTIVITAMIN WITH MINERALS) TABS tablet Take 1 tablet by mouth daily.    [provider]  ondansetron (ZOFRAN ODT) 4 MG disintegrating tablet 4mg  ODT q4 hours prn nausea/vomit 03/18/18   Deno Etienne, DO  potassium chloride SA (K-DUR,KLOR-CON) 20 MEQ tablet Take 1 tablet (20 mEq total) by mouth daily. 11/15/17   [name], MD  predniSONE (DELTASONE) 10 MG tablet Take in tapering course as directed 6-5-4-3-2-1 01/28/18   [name], MD  torsemide (DEMADEX) 20 MG tablet TAKE ONE TABLET BY MOUTH EVERY DAY. 05/30/18   [name], MD  vitamin B-12 (CYANOCOBALAMIN) 1000 MCG tablet Take 2,000 mcg by mouth daily.    [provider]    Current Outpatient Medications  Medication Sig Dispense Refill  . acetaminophen (TYLENOL) 500 MG tablet Take 500 mg by mouth every 6 (six) hours as needed for mild pain, moderate pain or headache.     . ADVAIR HFA 267-12 MCG/ACT inhaler INHALE TWO PUFFS BY MOUTH TWICE A DAY 12 g 2  . albuterol (VENTOLIN HFA) 108 (90 Base)  MCG/ACT inhaler Two puffs every 4 hours if needed for wheezing or coughing .  May use 2 puffs 5-15 minutes prior to exercise. 18 g 1  . azelastine (ASTELIN) 0.1 % nasal spray Place 1-2 sprays into both nostrils 2 (two) times daily as needed. 30 mL 5  . levothyroxine (SYNTHROID) 88 MCG tablet TAKE ONE TABLET BY MOUTH DAILY 90 tablet 1  . metFORMIN (GLUCOPHAGE) 500 MG tablet TAKE ONE TABLET BY MOUTH TWICE A DAY WITH A MEAL 180 tablet 2  . montelukast (SINGULAIR) 10 MG tablet TAKE ONE TABLET BY MOUTH EVERY NIGHT AT BEDTIME 90 tablet 3  . omeprazole (PRILOSEC) 20 MG capsule Take 1 capsule (20 mg total) by mouth 2 (two) times daily before a meal. 60 capsule 3  . rosuvastatin (CRESTOR) 5 MG tablet Take 5 mg by mouth daily.    Marland Kitchen triamcinolone cream (KENALOG) 0.1 % Apply twice a day if needed to red itchy areas below the face. 80 g 5  . famotidine (PEPCID) 20 MG tablet Take 1 tablet (20 mg total) by mouth 2 (two) times daily. (Patient not taking: Reported on 12/02/2020) 60 tablet 3   No current facility-administered medications for this visit.    Allergies as of 12/02/2020 - Review Complete 12/02/2020  Allergen Reaction Noted  . Aspirin Other (See Comments) 07/08/2011  . Nsaids Other (See Comments) 07/21/2011    Family History  Problem Relation Age of Onset  . Pneumonia Mother   . COPD Mother   . Colon polyps Mother   . Heart disease Mother   . Thyroid disease Mother   . Obesity Mother   . COPD Father   . Obesity Father   . Brain cancer Maternal Grandfather   . Alcohol abuse Maternal Grandfather   . Hyperlipidemia Neg Hx   . Hypertension Neg Hx   .  Colon cancer Neg Hx   . Rectal cancer Neg Hx   . Stomach cancer Neg Hx   . Esophageal cancer Neg Hx       Physical Exam: General:   Alert, in NAD. No bilateral temporal wasting. Appears her stated age.  HEENT: No scleral icterus. Symmteric. Neck: No LAD, no thyromegaly Heart:  Regular rate and rhythm; no murmurs Pulm: Clear anteriorly;  no wheezing Abdomen:  Soft. Central obesity. Nontender. Nondistended. Normal bowel sounds. No rebound or guarding. No fluid wave.  LAD: No inguinal or umbilical LAD Extremities:  Without edema. Neurologic:  Alert and  oriented x4;  grossly normal neurologically; no asterixis or clonus. Skin: No jaundice. No palmar erythema or spider angioma. + Terry's nails. Psych:  Alert and cooperative. Normal mood and affect.  Thornton Park, MD, MPH [facility]

## 2020-12-02 NOTE — Patient Instructions (Addendum)
It was a pleasure to see you today. Based on our discussion, I am providing you with my recommendations below:  RECOMMENDATION(S):    I did not make any changes to your medication regimen. Please continue all medications as prescribed.   Please continue to abstain from alcohol use   Please continue to maintain a healthy weight and exercise regularly   We will plan to repeat your Colonoscopy in 2024   We will plan to repeat your Endoscopy in 2023   If you have not already done so, please ensure that you have completed your HAV and HBV vaccinations  LABS:   . I would like to see you back in 6 months. Before attending your appointment, please ensure you have completed your labs. Please proceed to the basement level for lab work BEFORE your next office visit by pressing "B" on the elevator. The lab is located at the first door on the left as you exit the elevator.  HEALTHCARE LAWS AND MY CHART RESULTS:   . Due to recent changes in healthcare laws, you may see results of your imaging and/or laboratory studies on MyChart before I have had a chance to review them.  I understand that in some cases there may be results that are confusing or concerning to you. Please understand that not all results are received at same time and often I may need to interpret multiple results in order to provide you with the best plan of care or course of treatment. Therefore, I ask that you please give me 48 hours to thoroughly review all your results before contacting my office for clarification.   IMAGING:  . You will be contacted by [facility] (Your caller ID will indicate phone # [phone]) within the next business 2 days to schedule your MRI of the abdomen - NOT NEEDED UNTIL AUGUST 2022. If you have not heard from them within 2 business days, please call [facility] at [phone] to follow up on the status of your appointment.    FOLLOW UP:  . I would like  for you to follow up with me in 6 months. Please call the office at [phone] to schedule your appointment.  BMI:  . If you are age 66 or older, your body mass index should be between 23-30. Your There is no height or weight on file to calculate BMI. If this is out of the aforementioned range listed, please consider follow up with your Primary Care Provider.  Thank you for trusting me with your gastrointestinal care!    Thornton Park, MD, MPH

## 2020-12-04 ENCOUNTER — Other Ambulatory Visit: Payer: Self-pay

## 2020-12-04 NOTE — Telephone Encounter (Signed)
error 

## 2020-12-20 ENCOUNTER — Other Ambulatory Visit: Payer: Self-pay

## 2020-12-20 ENCOUNTER — Ambulatory Visit (INDEPENDENT_AMBULATORY_CARE_PROVIDER_SITE_OTHER): Payer: Medicare Other

## 2020-12-20 ENCOUNTER — Encounter: Payer: Self-pay | Admitting: Podiatry

## 2020-12-20 ENCOUNTER — Ambulatory Visit (INDEPENDENT_AMBULATORY_CARE_PROVIDER_SITE_OTHER): Payer: Medicare Other | Admitting: Podiatry

## 2020-12-20 DIAGNOSIS — S92411A Displaced fracture of proximal phalanx of right great toe, initial encounter for closed fracture: Secondary | ICD-10-CM

## 2020-12-20 NOTE — Progress Notes (Signed)
Subjective:  Patient ID: Tara [name], female    DOB: 1956-08-26,  MRN: [id number]  Chief Complaint  Patient presents with  . Foot Pain    65 y.o. female presents with the above complaint.  Patient presents with a new complaint of right foot injury mostly in the [place].  Patient states is very swollen.  She injured it few days ago where her foot got caught and rolled over.  She states has been hurting since then she wanted to come in right away to get it evaluated.  She wanted to make sure there was nothing broken.  She denies any other acute complaints she has not seen anyone else prior to seeing me.  No open wounds noted.   Review of Systems: Negative except as noted in the HPI. Denies N/V/F/Ch.  Past Medical History:  Diagnosis Date  . Allergy   . Anxiety   . Arthritis   . Asthma   . Back pain   . Blood transfusion   . Breast cancer ([place]) 2012   left breast  . Chronic edema   . Cold hands and feet   . Complication of anesthesia    difficulty waking up/dizzy/lightheaded  . Cough   . Diabetes mellitus without complication ([place])    on Metformin-   . Dysrhythmia    irregular heartbeat, takes digoxin  . Environmental allergies   . Eye pain   . Fatigue   . GERD (gastroesophageal reflux disease)    occasionally   . H/O varicella   . H/O varicose veins   . Hepatic cirrhosis ([place])   . Hepatitis C   . History of chemotherapy 2013   left breast cancer  . History of measles, mumps, or rubella   . Hoarseness   . Hx of radiation therapy 12/15/11 - 01/29/12   left breast  . Hyperlipidemia   . Hypothyroidism   . Irregular heart beat    under control  . Joint pain   . Leg cramps   . Neuromuscular disorder ([place])    left foot nerve damage- neuropathy   . Osteopenia   . Palpitations   . Pre-diabetes   . Radiculopathy 07/2020  . Rheumatoid arthritis ([place])   . Ringing in ear   . Shortness of breath   . Swallowing difficulty   . Swelling of both lower extremities   .  Thrombocytopenia, primary ([place])   . TTP (thrombotic thrombocytopenic purpura)     Current Outpatient Medications:  .  acetaminophen (TYLENOL) 500 MG tablet, Take 500 mg by mouth every 6 (six) hours as needed for mild pain, moderate pain or headache. , Disp: , Rfl:  .  ADVAIR HFA 115-21 MCG/ACT inhaler, INHALE TWO PUFFS BY MOUTH TWICE A DAY, Disp: 12 g, Rfl: 2 .  albuterol (VENTOLIN HFA) 108 (90 Base) MCG/ACT inhaler, Two puffs every 4 hours if needed for wheezing or coughing .  May use 2 puffs 5-15 minutes prior to exercise., Disp: 18 g, Rfl: 1 .  azelastine (ASTELIN) 0.1 % nasal spray, Place 1-2 sprays into both nostrils 2 (two) times daily as needed., Disp: 30 mL, Rfl: 5 .  famotidine (PEPCID) 20 MG tablet, Take 1 tablet (20 mg total) by mouth 2 (two) times daily. (Patient not taking: Reported on 12/02/2020), Disp: 60 tablet, Rfl: 3 .  levothyroxine (SYNTHROID) 88 MCG tablet, TAKE ONE TABLET BY MOUTH DAILY, Disp: 90 tablet, Rfl: 1 .  metFORMIN (GLUCOPHAGE) 500 MG tablet, TAKE ONE TABLET BY MOUTH TWICE A  DAY WITH A MEAL, Disp: 180 tablet, Rfl: 2 .  montelukast (SINGULAIR) 10 MG tablet, TAKE ONE TABLET BY MOUTH EVERY NIGHT AT BEDTIME, Disp: 90 tablet, Rfl: 3 .  omeprazole (PRILOSEC) 20 MG capsule, Take 1 capsule (20 mg total) by mouth 2 (two) times daily before a meal., Disp: 60 capsule, Rfl: 3 .  rosuvastatin (CRESTOR) 5 MG tablet, Take 5 mg by mouth daily., Disp: , Rfl:  .  triamcinolone cream (KENALOG) 0.1 %, Apply twice a day if needed to red itchy areas below the face., Disp: 80 g, Rfl: 5  Social History   Tobacco Use  Smoking Status Never Smoker  Smokeless Tobacco Never Used    Allergies  Allergen Reactions  . Aspirin Other (See Comments)    Pt has a hx of TTP.   . Nsaids Other (See Comments)    Pt has a hx of TTP.    Objective:  There were no vitals filed for this visit. There is no height or weight on file to calculate BMI. Constitutional Well developed. Well nourished.   Vascular Dorsalis pedis pulses palpable bilaterally. Posterior tibial pulses palpable bilaterally. Capillary refill normal to all digits.  No cyanosis or clubbing noted. Pedal hair growth normal.  Neurologic Normal speech. Oriented to person, place, and time. Epicritic sensation to light touch grossly present bilaterally.  Dermatologic Nails well groomed and normal in appearance. No open wounds. No skin lesions.  Orthopedic:  Pain on palpation to the dorsal midfoot as well as the first metatarsophalangeal joint.  2+ pitting edema noted to circumferentially on the foot.  Mild ecchymosis present.  Unable to perform range of motion of the first metatarsophalangeal joint secondary to pain.   Radiographs: 3 views of skeletally mature adult right foot: Small nondisplaced fracture intra-articular noted to the base of the proximal phalanx right great toe.  Lisfranc interval is seems intact.  No other fractures noted.  No bony abnormality noted. Assessment:   1. Closed displaced fracture of proximal phalanx of right great toe, initial encounter    Plan:  Patient was evaluated and treated and all questions answered.  Right first base of the proximal phalanx fracture. -I explained the patient the etiology of fracture various treatment options were discussed.  Given the amount of pain and swelling that is present there may be an involvement of the midfoot/Lisfranc joint as well.  In order to properly assess/rule out I believe patient will benefit from a CT scan.  I discussed this with the patient in extensive detail.  Patient states understand would like to obtain CT scan.  CT scan was ordered. -Should be nonweightbearing to the right lower extremity in wheelchair with cam boot immobilization.  She will also benefit from Ace bandage compression wrap. -I will discuss with her the CT scan result before she goes on her trip.  If there is a widening of the Lisfranc she may need surgical intervention.  She  states understanding.  No follow-ups on file.

## 2020-12-21 ENCOUNTER — Other Ambulatory Visit: Payer: Self-pay | Admitting: Internal Medicine

## 2020-12-23 ENCOUNTER — Ambulatory Visit
Admission: RE | Admit: 2020-12-23 | Discharge: 2020-12-23 | Disposition: A | Discharge status: Home / Self Care | Payer: Medicare Other | Source: Ambulatory Visit | Attending: Podiatry | Admitting: Podiatry

## 2020-12-23 DIAGNOSIS — S92411A Displaced fracture of proximal phalanx of right great toe, initial encounter for closed fracture: Secondary | ICD-10-CM

## 2020-12-26 ENCOUNTER — Telehealth: Payer: Self-pay | Admitting: Podiatry

## 2020-12-26 NOTE — Telephone Encounter (Signed)
Patient called our office today stating she had a CT scan of her foot done on 12/23/20 and she would like to know her results.

## 2020-12-26 NOTE — Telephone Encounter (Signed)
I called and left a voicemail.

## 2020-12-31 NOTE — Telephone Encounter (Signed)
Patient called back, please give her a call as soon as you can. thanks

## 2021-01-06 ENCOUNTER — Ambulatory Visit: Payer: Medicare Other | Admitting: Family Medicine

## 2021-01-10 ENCOUNTER — Ambulatory Visit: Payer: Medicare Other | Admitting: Registered"

## 2021-01-14 ENCOUNTER — Other Ambulatory Visit: Payer: Self-pay

## 2021-01-14 ENCOUNTER — Ambulatory Visit (INDEPENDENT_AMBULATORY_CARE_PROVIDER_SITE_OTHER): Payer: Medicare Other | Admitting: Podiatry

## 2021-01-14 ENCOUNTER — Ambulatory Visit (INDEPENDENT_AMBULATORY_CARE_PROVIDER_SITE_OTHER): Payer: Medicare Other

## 2021-01-14 DIAGNOSIS — M7752 Other enthesopathy of left foot: Secondary | ICD-10-CM

## 2021-01-14 DIAGNOSIS — S92411A Displaced fracture of proximal phalanx of right great toe, initial encounter for closed fracture: Secondary | ICD-10-CM | POA: Diagnosis not present

## 2021-01-14 DIAGNOSIS — M779 Enthesopathy, unspecified: Secondary | ICD-10-CM

## 2021-01-15 ENCOUNTER — Encounter: Payer: Self-pay | Admitting: Podiatry

## 2021-01-15 NOTE — Progress Notes (Signed)
Subjective:  Patient ID: Tara [name], female    DOB: 1955/10/01,  MRN: [id number]  Chief Complaint  Patient presents with  . Foot Pain    Nail trim    65 y.o. female presents with the above complaint.  Patient presents with a new complaint of right foot injury mostly in the [place].  Patient states is very swollen.  She injured it few days ago where her foot got caught and rolled over.  She states has been hurting since then she wanted to come in right away to get it evaluated.  She wanted to make sure there was nothing broken.  She denies any other acute complaints she has not seen anyone else prior to seeing me.  No open wounds noted.   Review of Systems: Negative except as noted in the HPI. Denies N/V/F/Ch.  Past Medical History:  Diagnosis Date  . Allergy   . Anxiety   . Arthritis   . Asthma   . Back pain   . Blood transfusion   . Breast cancer ([place]) 2012   left breast  . Chronic edema   . Cold hands and feet   . Complication of anesthesia    difficulty waking up/dizzy/lightheaded  . Cough   . Diabetes mellitus without complication ([place])    on Metformin-   . Dysrhythmia    irregular heartbeat, takes digoxin  . Environmental allergies   . Eye pain   . Fatigue   . GERD (gastroesophageal reflux disease)    occasionally   . H/O varicella   . H/O varicose veins   . Hepatic cirrhosis ([facility])   . Hepatitis C   . History of chemotherapy 2013   left breast cancer  . History of measles, mumps, or rubella   . Hoarseness   . Hx of radiation therapy 12/15/11 - 01/29/12   left breast  . Hyperlipidemia   . Hypothyroidism   . Irregular heart beat    under control  . Joint pain   . Leg cramps   . Neuromuscular disorder ([place])    left foot nerve damage- neuropathy   . Osteopenia   . Palpitations   . Pre-diabetes   . Radiculopathy 07/2020  . Rheumatoid arthritis ([place])   . Ringing in ear   . Shortness of breath   . Swallowing difficulty   . Swelling of both lower  extremities   . Thrombocytopenia, primary ([place] [facility])   . TTP (thrombotic thrombocytopenic purpura)     Current Outpatient Medications:  .  acetaminophen (TYLENOL) 500 MG tablet, Take 500 mg by mouth every 6 (six) hours as needed for mild pain, moderate pain or headache. , Disp: , Rfl:  .  ADVAIR HFA 115-21 MCG/ACT inhaler, INHALE TWO PUFFS BY MOUTH TWICE A DAY, Disp: 12 g, Rfl: 2 .  albuterol (VENTOLIN HFA) 108 (90 Base) MCG/ACT inhaler, Two puffs every 4 hours if needed for wheezing or coughing .  May use 2 puffs 5-15 minutes prior to exercise., Disp: 18 g, Rfl: 1 .  azelastine (ASTELIN) 0.1 % nasal spray, Place 1-2 sprays into both nostrils 2 (two) times daily as needed., Disp: 30 mL, Rfl: 5 .  famotidine (PEPCID) 20 MG tablet, Take 1 tablet (20 mg total) by mouth 2 (two) times daily. (Patient not taking: Reported on 12/02/2020), Disp: 60 tablet, Rfl: 3 .  levothyroxine (SYNTHROID) 88 MCG tablet, TAKE ONE TABLET BY MOUTH DAILY, Disp: 90 tablet, Rfl: 1 .  metFORMIN (GLUCOPHAGE) 500 MG tablet, TAKE ONE  TABLET BY MOUTH TWICE A DAY WITH MEALS, Disp: 180 tablet, Rfl: 2 .  montelukast (SINGULAIR) 10 MG tablet, TAKE ONE TABLET BY MOUTH EVERY NIGHT AT BEDTIME, Disp: 90 tablet, Rfl: 3 .  omeprazole (PRILOSEC) 20 MG capsule, Take 1 capsule (20 mg total) by mouth 2 (two) times daily before a meal., Disp: 60 capsule, Rfl: 3 .  rosuvastatin (CRESTOR) 5 MG tablet, Take 5 mg by mouth daily., Disp: , Rfl:  .  triamcinolone cream (KENALOG) 0.1 %, Apply twice a day if needed to red itchy areas below the face., Disp: 80 g, Rfl: 5  Social History   Tobacco Use  Smoking Status Never Smoker  Smokeless Tobacco Never Used    Allergies  Allergen Reactions  . Aspirin Other (See Comments)    Pt has a hx of TTP.   . Nsaids Other (See Comments)    Pt has a hx of TTP.    Objective:  There were no vitals filed for this visit. There is no height or weight on file to calculate BMI. Constitutional Well developed. Well  nourished.  Vascular Dorsalis pedis pulses palpable bilaterally. Posterior tibial pulses palpable bilaterally. Capillary refill normal to all digits.  No cyanosis or clubbing noted. Pedal hair growth normal.  Neurologic Normal speech. Oriented to person, place, and time. Epicritic sensation to light touch grossly present bilaterally.  Dermatologic Nails well groomed and normal in appearance. No open wounds. No skin lesions.  Orthopedic:  Medial pain on palpation to the dorsal midfoot as well as the first metatarsophalangeal joint.  2+ pitting edema noted to circumferentially on the foot.  Mild ecchymosis present.  Unable to perform range of motion of the first metatarsophalangeal joint secondary to pain.  Pain on palpation to the left ankle gutter lateral.  Pain with range of motion of the ankle joint.  No pain at the subtalar joint.  No pain at the posterior tibial tendon, peroneal tendon, Achilles tendon.   Radiographs: 3 views of skeletally mature adult right foot: Small nondisplaced fracture intra-articular noted to the base of the proximal phalanx right great toe.  Lisfranc interval is seems intact.  No other fractures noted.  No bony abnormality noted. Assessment:   1. Capsulitis of ankle, left   2. Closed displaced fracture of proximal phalanx of right great toe, initial encounter    Plan:  Patient was evaluated and treated and all questions answered.  Right first base of the proximal phalanx fracture. -Clinically improving.  She does not have much pain at all to the big toe joint.  At this time I will clinically continue monitoring it.  I discussed with the patient that the bone can take many months to heal afterwards.  She may notice occasional numbness tingling all within normal limits.  She states understanding.  She will continue to wear the shoe until she no longer has any pain.  Left ankle capsulitis -I explained to the patient the etiology of capsulitis and worse treatment  options were discussed.  Given the amount of pain that she is having I believe she will benefit from a steroid injection help decrease acute inflammatory component associate with pain.  Patient agrees with the plan like to proceed with a steroid injection. -A steroid injection was performed at left ankle joint using 1% plain Lidocaine and 10 mg of Kenalog. This was well tolerated.   No follow-ups on file.

## 2021-01-17 ENCOUNTER — Encounter: Payer: Self-pay | Admitting: Oncology

## 2021-01-20 ENCOUNTER — Other Ambulatory Visit: Payer: Medicare Other | Admitting: Internal Medicine

## 2021-01-20 ENCOUNTER — Encounter: Payer: Self-pay | Admitting: Family Medicine

## 2021-01-20 ENCOUNTER — Other Ambulatory Visit: Payer: Self-pay | Admitting: Gastroenterology

## 2021-01-20 ENCOUNTER — Other Ambulatory Visit: Payer: Self-pay

## 2021-01-20 ENCOUNTER — Ambulatory Visit (INDEPENDENT_AMBULATORY_CARE_PROVIDER_SITE_OTHER): Payer: Medicare Other | Admitting: Family Medicine

## 2021-01-20 VITALS — BP 136/82 | HR 102 | Temp 98.1°F | Resp 16 | Ht 67.0 in

## 2021-01-20 DIAGNOSIS — F32A Depression, unspecified: Secondary | ICD-10-CM

## 2021-01-20 DIAGNOSIS — F411 Generalized anxiety disorder: Secondary | ICD-10-CM

## 2021-01-20 DIAGNOSIS — F419 Anxiety disorder, unspecified: Secondary | ICD-10-CM

## 2021-01-20 DIAGNOSIS — K219 Gastro-esophageal reflux disease without esophagitis: Secondary | ICD-10-CM | POA: Diagnosis not present

## 2021-01-20 DIAGNOSIS — M5414 Radiculopathy, thoracic region: Secondary | ICD-10-CM

## 2021-01-20 DIAGNOSIS — J3089 Other allergic rhinitis: Secondary | ICD-10-CM | POA: Diagnosis not present

## 2021-01-20 DIAGNOSIS — E1169 Type 2 diabetes mellitus with other specified complication: Secondary | ICD-10-CM

## 2021-01-20 DIAGNOSIS — M4724 Other spondylosis with radiculopathy, thoracic region: Secondary | ICD-10-CM

## 2021-01-20 DIAGNOSIS — R932 Abnormal findings on diagnostic imaging of liver and biliary tract: Secondary | ICD-10-CM

## 2021-01-20 DIAGNOSIS — Z853 Personal history of malignant neoplasm of breast: Secondary | ICD-10-CM

## 2021-01-20 DIAGNOSIS — J4540 Moderate persistent asthma, uncomplicated: Secondary | ICD-10-CM

## 2021-01-20 DIAGNOSIS — K7030 Alcoholic cirrhosis of liver without ascites: Secondary | ICD-10-CM

## 2021-01-20 DIAGNOSIS — L2084 Intrinsic (allergic) eczema: Secondary | ICD-10-CM | POA: Diagnosis not present

## 2021-01-20 DIAGNOSIS — E039 Hypothyroidism, unspecified: Secondary | ICD-10-CM

## 2021-01-20 MED ORDER — TRIAMCINOLONE ACETONIDE 0.1 % EX OINT: 1.0000 "application " | TOPICAL_OINTMENT | Freq: Two times a day (BID) | CUTANEOUS | 3 refills | Status: DC

## 2021-01-20 NOTE — Progress Notes (Signed)
[address] [place] [address] Dept: [phone]  FOLLOW UP NOTE  Patient ID: Tara [name], female    DOB: 04/19/1956  Age: 65 y.o. MRN: [id number] Date of Office Visit: 01/20/2021  Assessment  Chief Complaint: Other (Wanted to check for thrush on her palat ) and Rash (Wants to know if she can use a certain cream on the rash on her back)  HPI Tara [name] is a 65 year old female who presents to the clinic for follow-up visit.  She was last seen in this clinic on 07/30/2020 by Dr. Verlin Fester for evaluation of asthma, allergic rhinitis, reflux, and atopic dermatitis.  At today's visit, she reports her asthma has been well controlled with no shortness of breath, cough, or wheeze with activity or rest.  She continues montelukast 10 mg once a day, Advair 115-2 puffs twice a day with a spacer and rarely uses albuterol.  Allergic rhinitis is reported as moderately well controlled with symptoms including nasal congestion occurring in the morning which clears after the morning, occasional sneeze, and copious postnasal drainage.  She continues saline nasal rinses daily and occasionally uses azelastine nasal spray.  Reflux is reported as well controlled with no symptoms including heartburn or vomiting.  She continues omeprazole 20 mg twice a day.  Atopic dermatitis is reported as moderately well controlled with 1 red itchy area occurring on her right calf for which she uses clobetasol occasionally.  She also reports a new red itchy area on her back that occurred several months ago.  She is not currently using any medical intervention on this area at this time.  She reports that she has noticed white patches in her throat over the last few days.  She denies throat pain or irritation.  She continues to use a spacer with her inhaled corticosteroid inhaler as well as rinse her mouth after each use.  She expresses concern over her fourth finger nail on her right hand reporting that it has a white color to  it that she noticed about 2 weeks ago.  She reports this area is not painful and she did not scraped the finger away from the fingernail at any time.  Her current medications are listed in the chart.   Drug Allergies:  Allergies  Allergen Reactions  . Aspirin Other (See Comments)    Pt has a hx of TTP.   . Nsaids Other (See Comments)    Pt has a hx of TTP.     Physical Exam: BP 136/82 (BP Location: Right Arm, Patient Position: Sitting, Cuff Size: Large)   Pulse (!) 102   Temp 98.1 F (36.7 C) (Temporal)   Resp 16   Ht 5\' 7"  (1.702 m)   SpO2 96%   BMI 39.55 kg/m    Physical Exam Vitals reviewed.  Constitutional:      Appearance: Normal appearance.  HENT:     Head: Normocephalic and atraumatic.     Right Ear: Tympanic membrane normal.     Left Ear: Tympanic membrane normal.     Nose:     Comments: Bilateral nares slightly erythematous with clear nasal drainage noted.  Oropharynx with small white patches.  Pharynx erythematous.  Ears normal.  Eyes normal. Eyes:     Conjunctiva/sclera: Conjunctivae normal.  Cardiovascular:     Rate and Rhythm: Normal rate and regular rhythm.     Heart sounds: Normal heart sounds. No murmur heard.   Pulmonary:     Effort: Pulmonary effort is normal.  Breath sounds: Normal breath sounds.     Comments: Lungs clear to auscultation Musculoskeletal:        General: Normal range of motion.     Cervical back: Normal range of motion and neck supple.  Skin:    General: Skin is warm and dry.     Comments: Fourth finger on right hand with white area underneath the tip of the finger nail.  No redness or open area or drainage is noted  Neurological:     Mental Status: She is alert and oriented to person, place, and time.  Psychiatric:        Mood and Affect: Mood normal.        Behavior: Behavior normal.        Thought Content: Thought content normal.        Judgment: Judgment normal.     Diagnostics: FVC 2.67, FEV1 2.17.  Predicted FVC  3.29, predicted FEV1 2.55.  Spirometry indicates normal ventilatory function.  Assessment and Plan: 1. Moderate persistent asthma without complication   2. Other allergic rhinitis   3. Intrinsic atopic dermatitis   4. Gastroesophageal reflux disease without esophagitis     Meds ordered this encounter  Medications  . triamcinolone ointment (KENALOG) 0.1 %    Sig: Apply 1 application topically 2 (two) times daily. Apply twice daily to red itchy areas below the face.    Dispense:  45 g    Refill:  3    Patient Instructions  Asthma Continue montelukast 10 mg-take 1 tablet once a day to help prevent cough and wheeze. Continue Advair 115- 2 puffs twice a day with a spacer to prevent cough and wheeze. Rinse your mouth after each use Continue albuterol 2 puffs every 4 hours as needed for coughing, wheezing, tightness in chest, or shortness of breath.  Also, may use albuterol 2 puffs 5 to 15 minutes prior to exercise  Allergic rhinitis Continue fluticasone nasal spray 2 sprays each nostril once a day as needed for stuffy nose. In the right nostril, point the applicator out toward the right ear. In the left nostril, point the applicator out toward the left ear Continue azelastine 2 sprays in each nostril twice a day as needed Consider saline nasal rinses as needed for nasal symptoms. Use this before any medicated nasal sprays for best result  Reflux Continue omeprazole 20 mg twice a day for control of reflux Continue dietary and lifestyle modifications as listed below  Atopic dermatitis Begin a twice daily moisturizing routine Begin triamcinolone 0.1% ointment to red, itchy areas below your face twice a day as needed. Do not use this medication for longer than 2 weeks at a time  Oral candidiasis Begin nystatin swish and swallow 5 ml once every 6 hours for 7 days. Continue to rinse your mouth after using Advair  Follow up with your primary care provider for evaluation and treatment of  your finer nail   Call the clinic if this treatment plan is not working well for you  Follow up in 6 months or sooner if needed   Return in about 6 months (around 07/23/2021), or if symptoms worsen or fail to improve.    Thank you for the opportunity to care for this patient.  Please do not hesitate to contact me with questions.  Gareth Morgan, FNP [facility]

## 2021-01-20 NOTE — Patient Instructions (Signed)
Asthma Continue montelukast 10 mg-take 1 tablet once a day to help prevent cough and wheeze. Continue Advair 115- 2 puffs twice a day with a spacer to prevent cough and wheeze. Rinse your mouth after each use Continue albuterol 2 puffs every 4 hours as needed for coughing, wheezing, tightness in chest, or shortness of breath.  Also, may use albuterol 2 puffs 5 to 15 minutes prior to exercise  Allergic rhinitis Continue fluticasone nasal spray 2 sprays each nostril once a day as needed for stuffy nose. In the right nostril, point the applicator out toward the right ear. In the left nostril, point the applicator out toward the left ear Continue azelastine 2 sprays in each nostril twice a day as needed Consider saline nasal rinses as needed for nasal symptoms. Use this before any medicated nasal sprays for best result  Reflux Continue omeprazole 20 mg twice a day for control of reflux Continue dietary and lifestyle modifications as listed below  Atopic dermatitis Begin a twice daily moisturizing routine Begin triamcinolone 0.1% ointment to red, itchy areas below your face twice a day as needed. Do not use this medication for longer than 2 weeks at a time  Oral candidiasis Begin nystatin swish and swallow 5 ml once every 6 hours for 7 days. Continue to rinse your mouth after using Advair  Follow up with your primary care provider for evaluation and treatment of your finer nail   Call the clinic if this treatment plan is not working well for you  Follow up in 6 months or sooner if needed

## 2021-01-21 LAB — COMPLETE METABOLIC PANEL WITH GFR
AG Ratio: 1.4 (calc) (ref 1.0–2.5)
ALT: 10 U/L (ref 6–29)
AST: 11 U/L (ref 10–35)
Albumin: 4.4 g/dL (ref 3.6–5.1)
Alkaline phosphatase (APISO): 53 U/L (ref 37–153)
BUN/Creatinine Ratio: 9 (calc) (ref 6–22)
BUN: 9 mg/dL (ref 7–25)
CO2: 29 mmol/L (ref 20–32)
Calcium: 9.1 mg/dL (ref 8.6–10.4)
Chloride: 103 mmol/L (ref 98–110)
Creat: 1 mg/dL — ABNORMAL HIGH (ref 0.50–0.99)
GFR, Est African American: 68 mL/min/{1.73_m2} (ref >=60)
GFR, Est Non African American: 59 mL/min/{1.73_m2} — ABNORMAL LOW (ref >=60)
Globulin: 3.2 g/dL (calc) (ref 1.9–3.7)
Glucose, Bld: 100 mg/dL — ABNORMAL HIGH (ref 65–99)
Potassium: 4.6 mmol/L (ref 3.5–5.3)
Sodium: 143 mmol/L (ref 135–146)
Total Bilirubin: 0.9 mg/dL (ref 0.2–1.2)
Total Protein: 7.6 g/dL (ref 6.1–8.1)

## 2021-01-21 LAB — CBC WITH DIFFERENTIAL/PLATELET
Absolute Monocytes: 494 cells/uL (ref 200–950)
Basophils Absolute: 52 cells/uL (ref 0–200)
Basophils Relative: 1 %
Eosinophils Absolute: 172 cells/uL (ref 15–500)
Eosinophils Relative: 3.3 %
HCT: 40.7 % (ref 35.0–45.0)
Hemoglobin: 13.3 g/dL (ref 11.7–15.5)
Lymphs Abs: 785 cells/uL — ABNORMAL LOW (ref 850–3900)
MCH: 29.8 pg (ref 27.0–33.0)
MCHC: 32.7 g/dL (ref 32.0–36.0)
MCV: 91.3 fL (ref 80.0–100.0)
MPV: 12.7 fL — ABNORMAL HIGH (ref 7.5–12.5)
Monocytes Relative: 9.5 %
Neutro Abs: 3697 cells/uL (ref 1500–7800)
Neutrophils Relative %: 71.1 %
Platelets: 166 10*3/uL (ref 140–400)
RBC: 4.46 10*6/uL (ref 3.80–5.10)
RDW: 13.4 % (ref 11.0–15.0)
Total Lymphocyte: 15.1 %
WBC: 5.2 10*3/uL (ref 3.8–10.8)

## 2021-01-21 LAB — LIPID PANEL
Cholesterol: 179 mg/dL (ref <200)
HDL: 65 mg/dL (ref >=50)
LDL Cholesterol (Calc): 92 mg/dL (calc)
Non-HDL Cholesterol (Calc): 114 mg/dL (calc) (ref <130)
Total CHOL/HDL Ratio: 2.8 (calc) (ref <5.0)
Triglycerides: 123 mg/dL (ref <150)

## 2021-01-21 LAB — TSH: TSH: 3.5 mIU/L (ref 0.40–4.50)

## 2021-01-21 LAB — HEMOGLOBIN A1C
Hgb A1c MFr Bld: 5.5 % of total Hgb (ref <5.7)
Mean Plasma Glucose: 111 mg/dL
eAG (mmol/L): 6.2 mmol/L

## 2021-01-23 ENCOUNTER — Ambulatory Visit (INDEPENDENT_AMBULATORY_CARE_PROVIDER_SITE_OTHER): Payer: Medicare Other | Admitting: Internal Medicine

## 2021-01-23 ENCOUNTER — Encounter: Payer: Self-pay | Admitting: Internal Medicine

## 2021-01-23 ENCOUNTER — Other Ambulatory Visit: Payer: Self-pay

## 2021-01-23 VITALS — BP 100/80 | HR 111 | Ht 66.5 in | Wt 240.0 lb

## 2021-01-23 DIAGNOSIS — Z23 Encounter for immunization: Secondary | ICD-10-CM | POA: Diagnosis not present

## 2021-01-23 DIAGNOSIS — Z853 Personal history of malignant neoplasm of breast: Secondary | ICD-10-CM

## 2021-01-23 DIAGNOSIS — F411 Generalized anxiety disorder: Secondary | ICD-10-CM | POA: Diagnosis not present

## 2021-01-23 DIAGNOSIS — E1169 Type 2 diabetes mellitus with other specified complication: Secondary | ICD-10-CM

## 2021-01-23 DIAGNOSIS — E039 Hypothyroidism, unspecified: Secondary | ICD-10-CM

## 2021-01-23 DIAGNOSIS — Z0000 Encounter for general adult medical examination without abnormal findings: Secondary | ICD-10-CM | POA: Diagnosis not present

## 2021-01-23 DIAGNOSIS — E559 Vitamin D deficiency, unspecified: Secondary | ICD-10-CM

## 2021-01-23 DIAGNOSIS — E782 Mixed hyperlipidemia: Secondary | ICD-10-CM

## 2021-01-23 DIAGNOSIS — Z96643 Presence of artificial hip joint, bilateral: Secondary | ICD-10-CM

## 2021-01-23 DIAGNOSIS — F419 Anxiety disorder, unspecified: Secondary | ICD-10-CM

## 2021-01-23 DIAGNOSIS — J4520 Mild intermittent asthma, uncomplicated: Secondary | ICD-10-CM

## 2021-01-23 DIAGNOSIS — F32A Depression, unspecified: Secondary | ICD-10-CM

## 2021-01-23 DIAGNOSIS — Z8619 Personal history of other infectious and parasitic diseases: Secondary | ICD-10-CM

## 2021-01-23 LAB — POCT URINALYSIS DIPSTICK
Appearance: NEGATIVE
Bilirubin, UA: NEGATIVE
Blood, UA: NEGATIVE
Glucose, UA: NEGATIVE
Ketones, UA: NEGATIVE
Leukocytes, UA: NEGATIVE
Nitrite, UA: NEGATIVE
Odor: NEGATIVE
Protein, UA: NEGATIVE
Spec Grav, UA: 1.01 (ref 1.010–1.025)
Urobilinogen, UA: 0.2 E.U./dL
pH, UA: 6.5 (ref 5.0–8.0)

## 2021-01-23 NOTE — Progress Notes (Signed)
Subjective:    Patient ID: Tara [name], female    DOB: Jun 16, 1956, 65 y.o.   MRN: [id number]  HPI  65 year old Female seen for Medicare wellness, health maintenance exam and evaluation of medical illness.  Recently seen by allergist, Dr. Verlin Fester, for moderate persistent asthma and allergic rhinitis.  Currently on Singulair, Advair inhaler, and albuterol inhaler as well as Flonase nasal spray.  Allergist treated her for reflux with omeprazole 20 mg twice daily.  History of atopic dermatitis treated with triamcinolone 0.1% ointment to face as needed.  Was found to have oral candidiasis and was given nystatin orally.  In April had eversion injury of right foot.  Found to have displaced fracture of proximal phalanx right great toe treated by Dr. Posey Pronto, podiatrist.  History of left thoracic radiculopathy which has improved with hydrocodone APAP.  Previously had received a number of epidural steroid injections by Dr. Ermalene Postin in [facility] but recently saw Dr. Trenton Gammon in December 2021 and was referred to Dr. Brien Few who suggested T11 nerve block.  However symptoms have improved and I do not think she ever had that procedure done.  History of gastroparesis, history of Hepatitis C with incomplete treatment with interferon due to TTP.  Subsequently treated with Harvoni. History of gastroparesis with delayed gastric emptying noted March 2022.  Patient thinks she contracted hepatitis C by blood transfusion for TTP while living in [place].  Liver functions are stable.  History of breast cancer while living in [place] in 2012.  Had 10 mm suspicious abnormality on left mammogram and biopsy showed invasive ductal carcinoma grade 3 estrogen receptor positive/progesterone receptor positive/HER2 positive.  Had left lumpectomy and sentinel node sampling December 2012.  Sentinel nodes negative for tumor.  Patient had 4 cycles of adjuvant chemotherapy.  Chemotherapy was started in [place] in 2013 after she moved here from  [facility].  She also received radiation treatment here.  Developed cellulitis in her left breast at that time and was treated with Keflex.  History of impaired glucose tolerance.  Longstanding history of obesity and did try healthy weight clinic for a while but was not really committed.  History of TTP when she was 65 years of age.  Did not have splenectomy.  Says she had recurrence at age 49 while being treated with interferon for hepatitis C.  History of bilateral hip replacements, right 1 in 2005 and left 20 2007.  History of hypothyroidism and allergic rhinitis.  History of depression treated with Celexa  History of eczema left foot  History of dependent edema  In July 2017 she was admitted to the hospital with history of hypokalemia thought to be secondary to diarrhea.  Presumably had viral gastroenteritis with more than 10 bowel movements daily.  Was treated with IV fluids and IV potassium.  Subsequently she had a fall at home while she was sick with gastroenteritis and suffered a nondisplaced right posterior lateral eighth rib fracture.  History of left lower lobe pneumonia January 2017 treated as an outpatient.  In December 2016 she had CT of the chest with contrast for complaint of back and chest pain for 18 months but study showed no evidence of metastatic disease.  Dr. Jana Hakim referred her to me in 2013 for primary care.  History of vitamin D deficiency.  I think she should continue with high-dose vitamin D indefinitely.  Social history: She is married.  She is a Agricultural engineer.  Does not smoke.  Husband is a Dance movement psychotherapist.  She drinks a glass of wine about twice weekly.  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  She snacks in the late afternoon and evening.  History of adenomatous colon polyp June 2020 and has been advised to have repeat colonoscopy which she has yet to do.   Review of Systems fractured right great toe in a Fall at [employer] in April. Saw Dr. Patel/ Now has pain in the foot. Received cortisone injection left foot but still hurts. Said to have bone spur and arthritis left foot.     Objective:   Physical Exam Vital signs reviewed.  Skin: Warm and dry.  No cervical adenopathy.  No thyromegaly.  No carotid bruits.  Chest is clear.  Cardiac exam: Regular rate and rhythm without murmur.  Abdomen obese soft nondistended without hepatosplenomegaly masses or tenderness.  Trace lower extremity edema.  Bimanual exam is normal.  Neuro is intact without focal deficits.       Assessment & Plan:  Anxiety disorder-anxiety state: Patient worries a lot about her health  History of hepatitis C treated with Harvoni in the remote past  History of breast cancer treated and followed by Dr. Jana Hakim but recently released from cancer follow-up  History of thoracic back pain but that has recently improved after seeing Dr. Brien Few.  History of taking Cymbalta for pain control  History of TTP in the remote past  Impaired glucose tolerance  Hypothyroidism stable on thyroid replacement  History of asthma seen by allergist  History of allergic rhinitis  Status post bilateral hip replacements  Remote history of cardiac dysrhythmia treated with digoxin.  She has not had an issue with this in a number of years so I have discontinued digoxin  Dependent edema likely due to venous insufficiency and aggravated by obesity and hot weather.  This is treated with a diuretic.  History of GE reflux treated with PPI  History of vitamin D deficiency and I think should stay on high-dose vitamin D weekly  Reminded regarding colonoscopy with history of tubular adenoma  Plan: Would like to see patient be more motivated about diet, exercise and weight loss  Plan: Follow-up in 6 months   Subjective:   Patient presents for Medicare Annual/Subsequent preventive examination.  Review Past Medical/Family/Social: See above   Risk  Factors  Current exercise habits: Not a lot of exercise Dietary issues discussed: Yes  Cardiac risk factors: Obesity and hyperlipidemia  Depression Screen  (Note: if answer to either of the following is "Yes", a more complete depression screening is indicated)   Over the past two weeks, have you felt down, depressed or hopeless? No  Over the past two weeks, have you felt little interest or pleasure in doing things? No Have you lost interest or pleasure in daily life? No Do you often feel hopeless? No Do you cry easily over simple problems? No   Activities of Daily Living  In your present state of health, do you have any difficulty performing the following activities?:   Driving? No  Managing money? No  Feeding yourself? No  Getting from bed to chair? No  Climbing a flight of stairs?  Yes Preparing food and eating?: No  Bathing or showering? No  Getting dressed: No  Getting to the toilet? No  Using the toilet:No  Moving around from place to place: No  In the past year have you fallen or had a near fall?:  Yes Are you sexually active? No  Do you have more than one partner? No   Hearing Difficulties: No  Do you often ask people to speak up or repeat themselves?  Yes Do you experience ringing or noises in your ears?  Yes Do you have difficulty understanding soft or whispered voices?  Yes Do you feel that you have a problem with memory?  Yes Do you often misplace items? No    Home Safety:  Do you have a smoke alarm at your residence? Yes Do you have grab bars in the bathroom?  None Do you have throw rugs in your house?  Yes   Cognitive Testing  Alert? Yes Normal Appearance?Yes  Oriented to person? Yes Place? Yes  Time? Yes  Recall of three objects? Yes  Can perform simple calculations? Yes  Displays appropriate judgment?Yes  Can read the correct time from a watch face?Yes   List the Names of Other Physician/Practitioners you currently use:  See referral list for  the physicians patient is currently seeing.     Review of Systems: See above   Objective:     General appearance: Appears stated age and obese  Head: Normocephalic, without obvious abnormality, atraumatic  Eyes: conj clear, EOMi PEERLA  Ears: normal TM's and external ear canals both ears  Nose: Nares normal. Septum midline. Mucosa normal. No drainage or sinus tenderness.  Throat: lips, mucosa, and tongue normal; teeth and gums normal  Neck: no adenopathy, no carotid bruit, no JVD, supple, symmetrical, trachea midline and thyroid not enlarged, symmetric, no tenderness/mass/nodules  No CVA tenderness.  Lungs: clear to auscultation bilaterally  Breasts: normal appearance, no masses or tenderness, top of the pacemaker on left upper chest. Incision well-healed. It is tender.  Heart: regular rate and rhythm, S1, S2 normal, no murmur, click, rub or gallop  Abdomen: soft, non-tender; bowel sounds normal; no masses, no organomegaly  Musculoskeletal: ROM normal in all joints, no crepitus, no deformity, Normal muscle strengthen. Back  is symmetric, no curvature. Skin: Skin color, texture, turgor normal. No rashes or lesions  Lymph nodes: Cervical, supraclavicular, and axillary nodes normal.  Neurologic: CN 2 -12 Normal, Normal symmetric reflexes. Normal coordination and gait  Psych: Alert & Oriented x 3, Mood appear stable.    Assessment:    Annual wellness medicare exam   Plan:    During the course of the visit the patient was educated and counseled about appropriate screening and preventive services including:   Mammogram done 05/19/2021  Reminded regarding colonoscopy due 2024 per insurance coverage     Patient Instructions (the written plan) was given to the patient.  Medicare Attestation  I have personally reviewed:  The patient's medical and social history  Their use of alcohol, tobacco or illicit drugs  Their current medications and supplements  The patient's functional  ability including ADLs,fall risks, home safety risks, cognitive, and hearing and visual impairment  Diet and physical activities  Evidence for depression or mood disorders  The patient's weight, height, BMI, and visual acuity have been recorded in the chart. I have made referrals, counseling, and provided education to the patient based on review of the above and I have provided the patient with a written personalized care plan for preventive services.

## 2021-02-07 ENCOUNTER — Ambulatory Visit (INDEPENDENT_AMBULATORY_CARE_PROVIDER_SITE_OTHER): Payer: Medicare Other | Admitting: Podiatry

## 2021-02-07 ENCOUNTER — Other Ambulatory Visit: Payer: Self-pay

## 2021-02-07 DIAGNOSIS — M76822 Posterior tibial tendinitis, left leg: Secondary | ICD-10-CM

## 2021-02-10 ENCOUNTER — Other Ambulatory Visit: Payer: Self-pay

## 2021-02-10 ENCOUNTER — Telehealth: Payer: Self-pay | Admitting: *Deleted

## 2021-02-10 DIAGNOSIS — R112 Nausea with vomiting, unspecified: Secondary | ICD-10-CM

## 2021-02-10 NOTE — Telephone Encounter (Signed)
Patient is calling and would like a referral to PT([facility])for her ankle. Please advise.

## 2021-02-12 ENCOUNTER — Other Ambulatory Visit: Payer: Self-pay | Admitting: *Deleted

## 2021-02-12 ENCOUNTER — Telehealth: Payer: Self-pay | Admitting: *Deleted

## 2021-02-12 NOTE — Telephone Encounter (Signed)
Faxed a successful confirmation to [facility] 02/12/21.

## 2021-02-13 ENCOUNTER — Encounter: Payer: Self-pay | Admitting: Podiatry

## 2021-02-13 NOTE — Progress Notes (Signed)
Subjective:  Patient ID: Tara [name], female    DOB: 09-25-1955,  MRN: [id number]  Chief Complaint  Patient presents with   Foot Pain    Left foot pain     65 y.o. female presents with the above complaint.  Patient presents with complaint of left medial foot pain.  Patient states that she states is a new complaint.  Is been going on for quite some time.  Hurts with ambulation.  She has not tried any other treatment options she would like to discuss other treatment options.  She has been wearing her regular shoes.  She does not wear orthotics.   Review of Systems: Negative except as noted in the HPI. Denies N/V/F/Ch.  Past Medical History:  Diagnosis Date   Allergy    Anxiety    Arthritis    Asthma    Back pain    Blood transfusion    Breast cancer ([place]) 2012   left breast   Chronic edema    Cold hands and feet    Complication of anesthesia    difficulty waking up/dizzy/lightheaded   Cough    Diabetes mellitus without complication (HCC)    on Metformin-    Dysrhythmia    irregular heartbeat, takes digoxin   Environmental allergies    Eye pain    Fatigue    GERD (gastroesophageal reflux disease)    occasionally    H/O varicella    H/O varicose veins    Hepatic cirrhosis (HCC)    Hepatitis C    History of chemotherapy 2013   left breast cancer   History of measles, mumps, or rubella    Hoarseness    Hx of radiation therapy 12/15/11 - 01/29/12   left breast   Hyperlipidemia    Hypothyroidism    Irregular heart beat    under control   Joint pain    Leg cramps    Neuromuscular disorder (HCC)    left foot nerve damage- neuropathy    Osteopenia    Palpitations    Pre-diabetes    Radiculopathy 07/2020   Rheumatoid arthritis ([place])    Ringing in ear    Shortness of breath    Swallowing difficulty    Swelling of both lower extremities    Thrombocytopenia, primary (HCC)    TTP (thrombotic thrombocytopenic purpura)     Current Outpatient Medications:     acetaminophen (TYLENOL) 500 MG tablet, Take 500 mg by mouth every 6 (six) hours as needed for mild pain, moderate pain or headache. , Disp: , Rfl:    ADVAIR HFA 115-21 MCG/ACT inhaler, INHALE TWO PUFFS BY MOUTH TWICE A DAY, Disp: 12 g, Rfl: 2   albuterol (VENTOLIN HFA) 108 (90 Base) MCG/ACT inhaler, Two puffs every 4 hours if needed for wheezing or coughing .  May use 2 puffs 5-15 minutes prior to exercise., Disp: 18 g, Rfl: 1   azelastine (ASTELIN) 0.1 % nasal spray, Place 1-2 sprays into both nostrils 2 (two) times daily as needed., Disp: 30 mL, Rfl: 5   famotidine (PEPCID) 20 MG tablet, TAKE ONE TABLET BY MOUTH TWICE A DAY, Disp: 60 tablet, Rfl: 3   levothyroxine (SYNTHROID) 88 MCG tablet, TAKE ONE TABLET BY MOUTH DAILY, Disp: 90 tablet, Rfl: 1   metFORMIN (GLUCOPHAGE) 500 MG tablet, TAKE ONE TABLET BY MOUTH TWICE A DAY WITH MEALS, Disp: 180 tablet, Rfl: 2   montelukast (SINGULAIR) 10 MG tablet, TAKE ONE TABLET BY MOUTH EVERY NIGHT AT BEDTIME, Disp: 90  tablet, Rfl: 3   omeprazole (PRILOSEC) 20 MG capsule, Take 1 capsule (20 mg total) by mouth 2 (two) times daily before a meal., Disp: 60 capsule, Rfl: 3   rosuvastatin (CRESTOR) 5 MG tablet, Take 5 mg by mouth daily., Disp: , Rfl:    triamcinolone ointment (KENALOG) 0.1 %, Apply 1 application topically 2 (two) times daily. Apply twice daily to red itchy areas below the face., Disp: 45 g, Rfl: 3  Social History   Tobacco Use  Smoking Status Never  Smokeless Tobacco Never    Allergies  Allergen Reactions   Aspirin Other (See Comments)    Pt has a hx of TTP.    Nsaids Other (See Comments)    Pt has a hx of TTP.    Objective:  There were no vitals filed for this visit. There is no height or weight on file to calculate BMI. Constitutional Well developed. Well nourished.  Vascular Dorsalis pedis pulses palpable bilaterally. Posterior tibial pulses palpable bilaterally. Capillary refill normal to all digits.  No cyanosis or clubbing  noted. Pedal hair growth normal.  Neurologic Normal speech. Oriented to person, place, and time. Epicritic sensation to light touch grossly present bilaterally.  Dermatologic Nails well groomed and normal in appearance. No open wounds. No skin lesions.  Orthopedic: Pain on palpation along the course of posterior tibial tendon including the insertion of posterior tibial tendon.  Pain with resisted plantarflexion inversion of the foot.  No pain with dorsiflexion eversion of the foot.  No pain at the Achilles tendon, peroneal tendon, ATFL ligament  No further pain on palpation to the left ankle gutter lateral.  No pain with range of motion of the ankle joint.  No pain at the subtalar joint.  No pain at the posterior tibial tendon, peroneal tendon, Achilles tendon.   Radiographs: 3 views of skeletally mature adult right foot: Small nondisplaced fracture intra-articular noted to the base of the proximal phalanx right great toe.  Lisfranc interval is seems intact.  No other fractures noted.  No bony abnormality noted. Assessment:   1. Posterior tibial tendinitis, left     Plan:  Patient was evaluated and treated and all questions answered.  Left posterior tibial tendinitis -The patient the etiology of posterior tibial tendinitis and various treatment options were extensively discussed.  Given the amount of pain she is having I believe she will benefit from steroid injection of decrease inflammatory component associated pain.  Patient agrees with plan to proceed with steroid injection. A steroid injection was performed at left medial foot using 1% plain Lidocaine and 10 mg of Kenalog. This was well tolerated.   Right first base of the proximal phalanx fracture. -Resolved  Left ankle capsulitis -Clinically resolving   No follow-ups on file.

## 2021-03-08 NOTE — Patient Instructions (Signed)
We are glad to hear your left thoracic pain has improved.  Continue current medications.  Please try to watch diet and get some exercise.  Would encourage you to reconsider returning to weight loss center.  Return in 6 months.

## 2021-03-10 ENCOUNTER — Telehealth: Payer: Self-pay | Admitting: Family Medicine

## 2021-03-10 DIAGNOSIS — R932 Abnormal findings on diagnostic imaging of liver and biliary tract: Secondary | ICD-10-CM

## 2021-03-10 DIAGNOSIS — K7030 Alcoholic cirrhosis of liver without ascites: Secondary | ICD-10-CM

## 2021-03-10 NOTE — Telephone Encounter (Signed)
Pt request refills for Advair and omerprazole, pt also requesting a spacer.

## 2021-03-11 MED ORDER — AEROCHAMBER MV MISC: 2 refills | Status: AC

## 2021-03-11 MED ORDER — ADVAIR HFA 115-21 MCG/ACT IN AERO: INHALATION_SPRAY | RESPIRATORY_TRACT | 3 refills | Status: DC

## 2021-03-11 MED ORDER — OMEPRAZOLE 20 MG PO CPDR: 20.0000 mg | DELAYED_RELEASE_CAPSULE | Freq: Two times a day (BID) | ORAL | 3 refills | Status: DC

## 2021-03-11 NOTE — Telephone Encounter (Signed)
Thank you :)

## 2021-03-11 NOTE — Telephone Encounter (Signed)
Sent in refills and sent in spacer to local pharmacy as well

## 2021-03-12 ENCOUNTER — Ambulatory Visit (HOSPITAL_COMMUNITY)
Admission: RE | Admit: 2021-03-12 | Discharge: 2021-03-12 | Disposition: A | Discharge status: Home / Self Care | Payer: Medicare Other | Source: Ambulatory Visit | Attending: Gastroenterology | Admitting: Gastroenterology

## 2021-03-12 ENCOUNTER — Other Ambulatory Visit: Payer: Self-pay

## 2021-03-12 ENCOUNTER — Ambulatory Visit: Payer: Medicare Other | Admitting: Podiatry

## 2021-03-12 DIAGNOSIS — R112 Nausea with vomiting, unspecified: Secondary | ICD-10-CM | POA: Diagnosis not present

## 2021-03-19 ENCOUNTER — Encounter: Payer: Self-pay | Admitting: Podiatry

## 2021-03-19 ENCOUNTER — Other Ambulatory Visit: Payer: Self-pay

## 2021-03-19 ENCOUNTER — Ambulatory Visit (INDEPENDENT_AMBULATORY_CARE_PROVIDER_SITE_OTHER): Payer: Medicare Other | Admitting: Podiatry

## 2021-03-19 DIAGNOSIS — M25372 Other instability, left ankle: Secondary | ICD-10-CM | POA: Diagnosis not present

## 2021-03-19 DIAGNOSIS — Z9181 History of falling: Secondary | ICD-10-CM

## 2021-03-19 DIAGNOSIS — M25371 Other instability, right ankle: Secondary | ICD-10-CM

## 2021-03-21 ENCOUNTER — Encounter: Payer: Self-pay | Admitting: Podiatry

## 2021-03-21 NOTE — Progress Notes (Addendum)
Subjective:  Patient ID: Tara [name], female    DOB: 1956/07/07,  MRN: [id number]  Chief Complaint  Patient presents with   Foot Pain    Left foot pain PT stated that she has some improvement she feels like therapy is helping    65 y.o. female presents with the above complaint.  Patient presents with a follow-up of left posterior tibial tendinitis.  He did she does not have any pain.  She is doing a lot better.  She now has a secondary complaint of ankle instability when ambulating.  She has had history of falling down because her ankle gives out.  She just has a feeling.  She denies any other acute complaints.  She would like to get that treated and discussed balance bracing   Review of Systems: Negative except as noted in the HPI. Denies N/V/F/Ch.  Past Medical History:  Diagnosis Date   Allergy    Anxiety    Arthritis    Asthma    Back pain    Blood transfusion    Breast cancer ([place]) 2012   left breast   Chronic edema    Cold hands and feet    Complication of anesthesia    difficulty waking up/dizzy/lightheaded   Cough    Diabetes mellitus without complication (HCC)    on Metformin-    Dysrhythmia    irregular heartbeat, takes digoxin   Environmental allergies    Eye pain    Fatigue    GERD (gastroesophageal reflux disease)    occasionally    H/O varicella    H/O varicose veins    Hepatic cirrhosis (HCC)    Hepatitis C    History of chemotherapy 2013   left breast cancer   History of measles, mumps, or rubella    Hoarseness    Hx of radiation therapy 12/15/11 - 01/29/12   left breast   Hyperlipidemia    Hypothyroidism    Irregular heart beat    under control   Joint pain    Leg cramps    Neuromuscular disorder (HCC)    left foot nerve damage- neuropathy    Osteopenia    Palpitations    Pre-diabetes    Radiculopathy 07/2020   Rheumatoid arthritis ([place])    Ringing in ear    Shortness of breath    Swallowing difficulty    Swelling of both lower  extremities    Thrombocytopenia, primary (HCC)    TTP (thrombotic thrombocytopenic purpura)     Current Outpatient Medications:    acetaminophen (TYLENOL) 500 MG tablet, Take 500 mg by mouth every 6 (six) hours as needed for mild pain, moderate pain or headache. , Disp: , Rfl:    albuterol (VENTOLIN HFA) 108 (90 Base) MCG/ACT inhaler, Two puffs every 4 hours if needed for wheezing or coughing .  May use 2 puffs 5-15 minutes prior to exercise., Disp: 18 g, Rfl: 1   azelastine (ASTELIN) 0.1 % nasal spray, Place 1-2 sprays into both nostrils 2 (two) times daily as needed., Disp: 30 mL, Rfl: 5   famotidine (PEPCID) 20 MG tablet, TAKE ONE TABLET BY MOUTH TWICE A DAY, Disp: 60 tablet, Rfl: 3   fluticasone-salmeterol (ADVAIR HFA) 115-21 MCG/ACT inhaler, INHALE TWO PUFFS BY MOUTH TWICE A DAY, Disp: 12 g, Rfl: 3   levothyroxine (SYNTHROID) 88 MCG tablet, TAKE ONE TABLET BY MOUTH DAILY, Disp: 90 tablet, Rfl: 1   metFORMIN (GLUCOPHAGE) 500 MG tablet, TAKE ONE TABLET BY MOUTH TWICE A  DAY WITH MEALS, Disp: 180 tablet, Rfl: 2   montelukast (SINGULAIR) 10 MG tablet, TAKE ONE TABLET BY MOUTH EVERY NIGHT AT BEDTIME, Disp: 90 tablet, Rfl: 3   omeprazole (PRILOSEC) 20 MG capsule, Take 1 capsule (20 mg total) by mouth 2 (two) times daily before a meal., Disp: 60 capsule, Rfl: 3   rosuvastatin (CRESTOR) 5 MG tablet, Take 5 mg by mouth daily., Disp: , Rfl:    Spacer/Aero-Holding Chambers (AEROCHAMBER MV) inhaler, Use as instructed, Disp: 1 each, Rfl: 2   triamcinolone ointment (KENALOG) 0.1 %, Apply 1 application topically 2 (two) times daily. Apply twice daily to red itchy areas below the face., Disp: 45 g, Rfl: 3  Social History   Tobacco Use  Smoking Status Never  Smokeless Tobacco Never    Allergies  Allergen Reactions   Aspirin Other (See Comments)    Pt has a hx of TTP.    Nsaids Other (See Comments)    Pt has a hx of TTP.    Objective:  There were no vitals filed for this visit. There is no  height or weight on file to calculate BMI. Constitutional Well developed. Well nourished.  Vascular Dorsalis pedis pulses palpable bilaterally. Posterior tibial pulses palpable bilaterally. Capillary refill normal to all digits.  No cyanosis or clubbing noted. Pedal hair growth normal.  Neurologic Normal speech. Oriented to person, place, and time. Epicritic sensation to light touch grossly present bilaterally.  Dermatologic Nails well groomed and normal in appearance. No open wounds. No skin lesions.  Orthopedic: No further pain on palpation along the course of posterior tibial tendon including the insertion of posterior tibial tendon.  No pain with resisted plantarflexion inversion of the foot.  No pain with dorsiflexion eversion of the foot.  No pain at the Achilles tendon, peroneal tendon   Ankle instability noted to bilateral ankle.  Mild anterior drawer test noted.  Talar tilt noted as well.  Pain on palpation to the ATFL ligament.  No further pain on palpation to the left ankle gutter lateral.  No pain with range of motion of the ankle joint.  No pain at the subtalar joint.  No pain at the posterior tibial tendon, peroneal tendon, Achilles tendon.   Radiographs: 3 views of skeletally mature adult right foot: Small nondisplaced fracture intra-articular noted to the base of the proximal phalanx right great toe.  Lisfranc interval is seems intact.  No other fractures noted.  No bony abnormality noted. Assessment:   1. Ankle instability, right   2. Ankle instability, left   3. History of fall      Plan:  Patient was evaluated and treated and all questions answered.  Ankle instability bilaterally with history of falling  -I explained to the patient the etiology of instability and various treatment options were discussed.  Given the amount of instability with a history of falling down I believe patient will benefit from worse balance brace.  She will be scheduled to see EJ for  Moore's balance bracing -Patient has misplaced her previous bracing and is in need of different type of bracing at this time.  Left posterior tibial tendinitis -Clinically healing and steroid injection help completely resolve her pain.   Right first base of the proximal phalanx fracture. -Resolved  Left ankle capsulitis -Clinically resolving   No follow-ups on file.

## 2021-03-24 ENCOUNTER — Telehealth: Payer: Self-pay | Admitting: Internal Medicine

## 2021-03-24 MED ORDER — ROSUVASTATIN CALCIUM 5 MG PO TABS: 5.0000 mg | ORAL_TABLET | Freq: Every day | ORAL | 1 refills | Status: DC

## 2021-03-24 NOTE — Telephone Encounter (Signed)
Tara [name] [phone]  [name] called to say pharmacy needs a new prescription for below medications because she is now taking it everyday instead of 3 times a week.  She would also like to get a 76 Day supply if possible.  rosuvastatin (CRESTOR) 5 MG tablet  [facility] [id number] - [place], [address] Phone:  [phone]  Fax:  [phone]

## 2021-04-06 NOTE — Addendum Note (Signed)
Encounter addended by: Annie Paras on: 04/06/2021 2:53 PM  Actions taken: Letter saved

## 2021-04-09 NOTE — Patient Instructions (Addendum)
Asthma Continue montelukast 10 mg-take 1 tablet once a day to help prevent cough and wheeze. Continue Advair 115- 2 puffs twice a day with a spacer to prevent cough and wheeze. Rinse your mouth after with mouthwash after each use Continue albuterol 2 puffs every 4 hours as needed for coughing, wheezing, tightness in chest, or shortness of breath.  Also, may use albuterol 2 puffs 5 to 15 minutes prior to exercise  Allergic rhinitis Stop Benadryl Continue fluticasone nasal spray 2 sprays each nostril once a day as needed for stuffy nose.  Continue azelastine 2 sprays in each nostril twice a day as needed.  Try using this more consistently to help with the drainage. Consider saline nasal rinses as needed for nasal symptoms. Use this before any medicated nasal sprays for best result  Reflux Continue omeprazole 20 mg twice a day for control of reflux Continue dietary and lifestyle modifications as listed below  Rash Continue moisturizing Continue triamcinolone 0.1% ointment to red, itchy areas below your face twice a day as needed.  Do not use on face, neck, groin, or armpit region.  Do not use this medication for longer than 2 weeks at a time Schedule an appointment with your dermatologist to discuss this rash.  Call us and let us know if you need another referral.  Also at that appointment discussed your right ring finger nail  Oral candidiasis Start the medication that you already have from your dentist    Call the clinic if this treatment plan is not working well for you  Follow up in 3 months or sooner if needed

## 2021-04-10 ENCOUNTER — Telehealth: Payer: Self-pay | Admitting: Internal Medicine

## 2021-04-10 ENCOUNTER — Ambulatory Visit (INDEPENDENT_AMBULATORY_CARE_PROVIDER_SITE_OTHER): Payer: Medicare Other | Admitting: Family

## 2021-04-10 ENCOUNTER — Encounter: Payer: Self-pay | Admitting: Family

## 2021-04-10 ENCOUNTER — Other Ambulatory Visit: Payer: Self-pay

## 2021-04-10 VITALS — BP 120/68 | HR 95 | Temp 97.7°F | Resp 16

## 2021-04-10 DIAGNOSIS — R21 Rash and other nonspecific skin eruption: Secondary | ICD-10-CM | POA: Diagnosis not present

## 2021-04-10 DIAGNOSIS — B370 Candidal stomatitis: Secondary | ICD-10-CM

## 2021-04-10 DIAGNOSIS — K219 Gastro-esophageal reflux disease without esophagitis: Secondary | ICD-10-CM

## 2021-04-10 DIAGNOSIS — J4540 Moderate persistent asthma, uncomplicated: Secondary | ICD-10-CM | POA: Diagnosis not present

## 2021-04-10 DIAGNOSIS — J3089 Other allergic rhinitis: Secondary | ICD-10-CM | POA: Diagnosis not present

## 2021-04-10 NOTE — Progress Notes (Signed)
[address] [place] [address] Dept: [phone]  FOLLOW UP NOTE  Patient ID: Tara [name], female    DOB: 1956/01/29  Age: 65 y.o. MRN: [id number] Date of Office Visit: 04/10/2021  Assessment  Chief Complaint: Asthma  HPI Tara [name] is a 65 year old female who presents today for follow-up of moderate persistent asthma without complication, allergic rhinitis, intrinsic atopic dermatitis, and gastroesophageal reflux disease.  Moderate persistent asthma is reported as controlled with montelukast 10 mg once a day, Advair 115 mcg 2 puffs twice a day with spacer, and albuterol as needed.  She denies any coughing, wheezing, tightness in chest, shortness of breath, and nocturnal awakenings due to breathing problems.  Since her last office visit she has not required any systemic steroids or made any trips to the emergency room or urgent care due to breathing problems.  She reports that she has not had to use her albuterol inhaler since we last saw her.  She reports that after she uses her Advair she tries to brush her teeth but that she needs to rinse her mouth out with mouthwash more often.  Allergic rhinitis is reported as moderately controlled with azelastine nasal spray as needed and saline rinse as needed.  She reports that she has not been using fluticasone nasal spray because she did not know that she could use both the azelastine nasal spray and the fluticasone nasal spray.  She reports postnasal drip in the morning for which she has been using Benadryl.  She also reports nasal congestion at times.  She denies rhinorrhea.  She has not had any sinus infections since we last saw her.  Reflux is reported as controlled with omeprazole 20 mg twice a day.  She reports that she continues to have itching and a rash on her back.  She reports that the rash/itching started a few months before she was last seen in our office.  She reports that the rash started above her right buttocks cheek  and has spread up her back.  She reports that the rash is itchy when she gets hot and does not burn.  The triamcinolone ointment she was given at the last office visit helped a little.  She denies any correlation with foods.  No one else in the household has this rash.  She is not on any new medications.  The only new products she has used is a Biofreeze for the bone spur and lavender massage oil.  She denies any bug bites and denies any fevers.  She reports that she does have arthritis.  She has not seen dermatology for this rash.  She reports that she will call and make an appointment with her dermatologist.  She also has questions about her right ring finger and why the nail is white.  Discussed with her to talk to the dermatologist about this also.  She reports that she feels that she may have oral candidiasis on the roof of her mouth.  She thinks that it went away with the nystatin swish and swallow that she was given at the last office visit.  She reports that she has a refill from her dentist and that she does not need a prescription.  Instructed her to start rinsing her mouth out with mouthwash after using her Advair inhaler.   Drug Allergies:  Allergies  Allergen Reactions   Aspirin Other (See Comments)    Pt has a hx of TTP.    Nsaids Other (See Comments)  Pt has a hx of TTP.     Review of Systems: Review of Systems  Constitutional:  Negative for chills and fever.  HENT:         Reports postnasal drip the morning and nasal congestion at times.  Denies rhinorrhea.  Eyes:        Reports occasional itchy watery eyes for which she has eyedrops  Respiratory:  Negative for cough, shortness of breath and wheezing.   Cardiovascular:  Negative for chest pain and palpitations.  Gastrointestinal:        Reports reflux is under good control with omeprazole 20 mg twice a day  Genitourinary:  Negative for dysuria.  Skin:  Positive for itching and rash.  Neurological:  Negative for headaches.   Endo/Heme/Allergies:  Positive for environmental allergies.    Physical Exam: BP 120/68   Pulse 95   Temp 97.7 F (36.5 C) (Temporal)   Resp 16   SpO2 97%    Physical Exam Constitutional:      Appearance: Normal appearance.  HENT:     Head: Normocephalic and atraumatic.     Comments: Pharynx: White patches noted on the roof of mouth, eyes normal, ears normal, nose bilateral lower turbinates mildly edematous with no drainage noted    Right Ear: Tympanic membrane, ear canal and external ear normal.     Left Ear: Tympanic membrane, ear canal and external ear normal.     Mouth/Throat:     Mouth: Mucous membranes are moist.     Comments: White patches noted on roof of mouth Eyes:     Conjunctiva/sclera: Conjunctivae normal.  Cardiovascular:     Rate and Rhythm: Regular rhythm.     Heart sounds: Normal heart sounds.  Pulmonary:     Effort: Pulmonary effort is normal.     Breath sounds: Normal breath sounds.     Comments: Lungs clear to auscultation Musculoskeletal:     Cervical back: Neck supple.  Skin:    General: Skin is warm.     Comments: Slightly erythematous small papular rash noted on right lower back that extends up towards the middle of the back. blanches to touch and does not follow a dermatome.  Neurological:     Mental Status: She is alert and oriented to person, place, and time.  Psychiatric:        Mood and Affect: Mood normal.        Behavior: Behavior normal.        Thought Content: Thought content normal.        Judgment: Judgment normal.    Diagnostics: FVC 2.66 L, FEV1 2.19 L.  Predicted FVC 3.50 L, predicted FEV1 2.67 L.  Spirometry indicates mild restriction.  Assessment and Plan: 1. Moderate persistent asthma without complication   2. Other allergic rhinitis   3. Gastroesophageal reflux disease without esophagitis   4. Rash   5. Oral candidiasis     No orders of the defined types were placed in this encounter.   Patient Instructions   Asthma Continue montelukast 10 mg-take 1 tablet once a day to help prevent cough and wheeze. Continue Advair 115- 2 puffs twice a day with a spacer to prevent cough and wheeze. Rinse your mouth after with mouthwash after each use Continue albuterol 2 puffs every 4 hours as needed for coughing, wheezing, tightness in chest, or shortness of breath.  Also, may use albuterol 2 puffs 5 to 15 minutes prior to exercise  Allergic rhinitis Stop Benadryl Continue fluticasone  nasal spray 2 sprays each nostril once a day as needed for stuffy nose.  Continue azelastine 2 sprays in each nostril twice a day as needed.  Try using this more consistently to help with the drainage. Consider saline nasal rinses as needed for nasal symptoms. Use this before any medicated nasal sprays for best result  Reflux Continue omeprazole 20 mg twice a day for control of reflux Continue dietary and lifestyle modifications as listed below  Rash Continue moisturizing Continue triamcinolone 0.1% ointment to red, itchy areas below your face twice a day as needed.  Do not use on face, neck, groin, or armpit region.  Do not use this medication for longer than 2 weeks at a time Schedule an appointment with your dermatologist to discuss this rash.  Call us and let us know if you need another referral.  Also at that appointment discussed your right ring finger nail  Oral candidiasis Start the medication that you already have from your dentist    Call the clinic if this treatment plan is not working well for you  Follow up in 3 months or sooner if needed  Return in about 3 months (around 07/11/2021), or if symptoms worsen or fail to improve.    Thank you for the opportunity to care for this patient.  Please do not hesitate to contact me with questions.  Althea Charon, FNP [facility]

## 2021-04-10 NOTE — Telephone Encounter (Signed)
Scheduled

## 2021-04-10 NOTE — Telephone Encounter (Signed)
Tara [name] [phone]  [facility] received a letter in the mail today from [facility], saying it final report found clinical significant incidental findings from the MR Abdomen W WO Contrast at [place] on 10/04/2020 that was ordered by GI doctor and that she should contact her PCP to discuss the specific findings or concerns. I have printed the letter and MRI.

## 2021-04-15 ENCOUNTER — Ambulatory Visit (INDEPENDENT_AMBULATORY_CARE_PROVIDER_SITE_OTHER): Payer: Medicare Other | Admitting: Internal Medicine

## 2021-04-15 ENCOUNTER — Other Ambulatory Visit: Payer: Self-pay

## 2021-04-15 ENCOUNTER — Encounter: Payer: Self-pay | Admitting: Internal Medicine

## 2021-04-15 VITALS — BP 110/80 | HR 73 | Ht 65.5 in | Wt 246.0 lb

## 2021-04-15 DIAGNOSIS — N281 Cyst of kidney, acquired: Secondary | ICD-10-CM | POA: Diagnosis not present

## 2021-04-15 DIAGNOSIS — R599 Enlarged lymph nodes, unspecified: Secondary | ICD-10-CM | POA: Diagnosis not present

## 2021-04-15 NOTE — Progress Notes (Deleted)
   Subjective:    Patient ID: Tara [name], female    DOB: 1955-09-16, 65 y.o.   MRN: [id number]  HPI 65 year old Female received letter in mail about follow up of hypermetabolic peripancreatic lymph node. She is here with her husband to discuss this. She is asymptomatic at present time. Prior study was ordered by Dr. Tarri Glenn, Gastroenterologist.  Report from October 04, 2020 indicated patient had a 3m porta hepatis lymph node measured: PET scan CT 05/16/2018 had decreased in size and was now measuring 11 mm in short axis.  No abdominal lymphadenopathy noted . also had complex cystic lesion medial aspect extreme upper pole of left kidney which was new since prior MRI.  Could not definitely be characterized due to motion.  MRI was recommended in 3 to 6 months to ensure stability.  Therefore there are couple of reasons for repeat MRI of abdomen with and without contrast.    Review of Systems says she recently had thrush.  No complaint of abdominal pain.  No urinary symptoms.     Objective:   Physical Exam Blood pressure 110/80 pulse 73 pulse oximetry 95% weight 246 pounds BMI 40.41  No CVA tenderness  Abdomen is obese.  No abnormalities are palpable due to obesity       Assessment & Plan:  Abnormal MRI done February 2022 questioning 11 mL lymph node porta hepatis  Also, complex cystic lesion of left kidney needs reevaluation  Plan: Order another MRI with and without contrast to ensure stability of these lesions.  Patient will be contacted by x-ray facility.

## 2021-04-15 NOTE — Patient Instructions (Signed)
Have ordered MRI with and without contrast of abdomen to follow up on lymph node in perihepatic region and kidney cyst.

## 2021-04-15 NOTE — Progress Notes (Addendum)
   Subjective:    Patient ID: Tara [name], female    DOB: 1955-11-29, 65 y.o.   MRN: [id number]  HPI 65 year old Female received letter in mail about follow up of hypermetabolic peripancreatic lymph node. She is here with her husband to discuss this. She is asymptomatic at present time. Prior study was ordered by Dr. Tarri Glenn, Gastroenterologist.  Report from October 04, 2020 indicated patient had a 61m porta hepatis lymph node measured: PET scan CT 05/16/2018 had decreased in size and was now measuring 11 mm in short axis.  No abdominal lymphadenopathy noted . also had complex cystic lesion medial aspect extreme upper pole of left kidney which was new since prior MRI.  Could not definitely be characterized due to motion.  MRI was recommended in 3 to 6 months to ensure stability.  Therefore there are couple of reasons for repeat MRI of abdomen with and without contrast.  She saw Dr. MTresa Moorefor evaluation of complex cyst left kidney in February and he felt that 1 year follow-up could be done perhaps with an ultrasound.  He felt that was low risk for malignant potential.  Review of Systems says she recently had thrush.  No complaint of abdominal pain.  No urinary symptoms.     Objective:   Physical Exam Blood pressure 110/80 pulse 73 pulse oximetry 95% weight 246 pounds BMI 40.41  No CVA tenderness  Abdomen is obese.  No abnormalities are palpable due to obesity       Assessment & Plan:  Abnormal MRI done February 2022 questioning 11 mL lymph node porta hepatis  Also, complex cystic lesion of left kidney needs reevaluation  Plan: Order another MRI with and without contrast to ensure stability of these lesions.  Patient will be contacted by x-ray facility.

## 2021-04-17 ENCOUNTER — Other Ambulatory Visit: Payer: Self-pay

## 2021-04-17 DIAGNOSIS — K769 Liver disease, unspecified: Secondary | ICD-10-CM

## 2021-04-17 DIAGNOSIS — R935 Abnormal findings on diagnostic imaging of other abdominal regions, including retroperitoneum: Secondary | ICD-10-CM

## 2021-04-17 DIAGNOSIS — R932 Abnormal findings on diagnostic imaging of liver and biliary tract: Secondary | ICD-10-CM

## 2021-04-17 DIAGNOSIS — K7030 Alcoholic cirrhosis of liver without ascites: Secondary | ICD-10-CM

## 2021-04-17 DIAGNOSIS — R1011 Right upper quadrant pain: Secondary | ICD-10-CM

## 2021-04-28 ENCOUNTER — Ambulatory Visit (HOSPITAL_COMMUNITY)
Admission: RE | Admit: 2021-04-28 | Discharge: 2021-04-28 | Disposition: A | Discharge status: Home / Self Care | Payer: Medicare Other | Source: Ambulatory Visit | Attending: Internal Medicine | Admitting: Internal Medicine

## 2021-04-28 ENCOUNTER — Other Ambulatory Visit: Payer: Self-pay

## 2021-04-28 DIAGNOSIS — R935 Abnormal findings on diagnostic imaging of other abdominal regions, including retroperitoneum: Secondary | ICD-10-CM | POA: Insufficient documentation

## 2021-04-28 DIAGNOSIS — R1011 Right upper quadrant pain: Secondary | ICD-10-CM | POA: Diagnosis present

## 2021-04-28 DIAGNOSIS — K769 Liver disease, unspecified: Secondary | ICD-10-CM | POA: Diagnosis present

## 2021-04-28 DIAGNOSIS — K7030 Alcoholic cirrhosis of liver without ascites: Secondary | ICD-10-CM

## 2021-04-28 DIAGNOSIS — R932 Abnormal findings on diagnostic imaging of liver and biliary tract: Secondary | ICD-10-CM | POA: Insufficient documentation

## 2021-04-28 MED ORDER — GADOBUTROL 1 MMOL/ML IV SOLN
10.0000 mL | Freq: Once | INTRAVENOUS | Status: AC | PRN
  Administered 2021-04-28: 10 mL via INTRAVENOUS

## 2021-05-02 ENCOUNTER — Other Ambulatory Visit: Payer: Medicare Other

## 2021-05-02 ENCOUNTER — Other Ambulatory Visit: Payer: Self-pay

## 2021-05-21 ENCOUNTER — Other Ambulatory Visit: Payer: Self-pay | Admitting: Gastroenterology

## 2021-05-21 DIAGNOSIS — R932 Abnormal findings on diagnostic imaging of liver and biliary tract: Secondary | ICD-10-CM

## 2021-05-21 DIAGNOSIS — K7030 Alcoholic cirrhosis of liver without ascites: Secondary | ICD-10-CM

## 2021-06-13 ENCOUNTER — Other Ambulatory Visit: Payer: Medicare Other

## 2021-06-17 ENCOUNTER — Telehealth: Payer: Self-pay | Admitting: Podiatry

## 2021-06-17 NOTE — Telephone Encounter (Signed)
Pt left message asking for advise on her appt that is scheduled with Dr Posey Pronto for 10.21.2022.Marland Kitchen Pt has not gotten her braces yet and wanted to know if she should keep the appt with Dr Posey Pronto.  I returned call and spoke to pt to let her know I would discuss with Dr Posey Pronto and get back to her tomorrow. She said that sounds like a plan.

## 2021-06-18 ENCOUNTER — Other Ambulatory Visit: Payer: Self-pay | Admitting: Gastroenterology

## 2021-06-18 DIAGNOSIS — K7030 Alcoholic cirrhosis of liver without ascites: Secondary | ICD-10-CM

## 2021-06-18 DIAGNOSIS — R932 Abnormal findings on diagnostic imaging of liver and biliary tract: Secondary | ICD-10-CM

## 2021-06-19 ENCOUNTER — Other Ambulatory Visit: Payer: Self-pay | Admitting: Internal Medicine

## 2021-06-20 ENCOUNTER — Ambulatory Visit: Payer: Medicare Other | Admitting: Podiatry

## 2021-07-05 ENCOUNTER — Other Ambulatory Visit: Payer: Self-pay | Admitting: Family Medicine

## 2021-07-05 DIAGNOSIS — R932 Abnormal findings on diagnostic imaging of liver and biliary tract: Secondary | ICD-10-CM

## 2021-07-05 DIAGNOSIS — K7030 Alcoholic cirrhosis of liver without ascites: Secondary | ICD-10-CM

## 2021-07-11 ENCOUNTER — Other Ambulatory Visit: Payer: Self-pay

## 2021-07-11 ENCOUNTER — Other Ambulatory Visit: Payer: Medicare Other

## 2021-07-11 DIAGNOSIS — M25372 Other instability, left ankle: Secondary | ICD-10-CM

## 2021-07-11 DIAGNOSIS — M25371 Other instability, right ankle: Secondary | ICD-10-CM

## 2021-07-11 DIAGNOSIS — Z9181 History of falling: Secondary | ICD-10-CM

## 2021-07-14 NOTE — Patient Instructions (Addendum)
Asthma Continue montelukast 10 mg-take 1 tablet once a day to help prevent cough and wheeze. Decrease Advair 115- 1 puff twice a day with a spacer to prevent cough and wheeze. Rinse your mouth after with mouthwash after each use. Let's see if this helps decrease the thrush (yeast) in your mouth. If your asthma symptoms worsen increase your Advair 115 back to 2 puffs twice a day with spacer. Continue albuterol 2 puffs every 4 hours as needed for coughing, wheezing, tightness in chest, or shortness of breath.  Also, may use albuterol 2 puffs 5 to 15 minutes prior to exercise Try using the albuterol inhaler when you have coughing and see if this helps  Allergic rhinitis Stop Benadryl. Start Claritin 10 mg once a day Stop fluticasone nasal spray for now due to the thrush (yeast) in your mouth Continue azelastine 2 sprays in each nostril twice a day as needed.   Continue saline nasal rinses as needed for nasal symptoms. Use this before any medicated nasal sprays for best result  Reflux Continue omeprazole 20 mg twice a day for control of reflux Continue dietary and lifestyle modifications as listed below  Rash Continue moisturizing Continue the medication as prescribed by your dermatologist  Oral candidiasis We will refill your Nystatin swish and swallow Let us know if this is not getting any better    Call the clinic if this treatment plan is not working well for you  Follow up in 4 weeks or sooner if needed

## 2021-07-15 ENCOUNTER — Ambulatory Visit (INDEPENDENT_AMBULATORY_CARE_PROVIDER_SITE_OTHER): Payer: Medicare Other | Admitting: Family

## 2021-07-15 ENCOUNTER — Encounter: Payer: Self-pay | Admitting: Family

## 2021-07-15 ENCOUNTER — Other Ambulatory Visit: Payer: Self-pay

## 2021-07-15 ENCOUNTER — Other Ambulatory Visit: Payer: Medicare Other | Admitting: Internal Medicine

## 2021-07-15 VITALS — BP 130/60 | HR 84 | Temp 97.2°F | Resp 20

## 2021-07-15 DIAGNOSIS — J4540 Moderate persistent asthma, uncomplicated: Secondary | ICD-10-CM

## 2021-07-15 DIAGNOSIS — R059 Cough, unspecified: Secondary | ICD-10-CM | POA: Diagnosis not present

## 2021-07-15 DIAGNOSIS — J3089 Other allergic rhinitis: Secondary | ICD-10-CM

## 2021-07-15 DIAGNOSIS — Z0000 Encounter for general adult medical examination without abnormal findings: Secondary | ICD-10-CM

## 2021-07-15 DIAGNOSIS — E119 Type 2 diabetes mellitus without complications: Secondary | ICD-10-CM

## 2021-07-15 DIAGNOSIS — E1169 Type 2 diabetes mellitus with other specified complication: Secondary | ICD-10-CM

## 2021-07-15 DIAGNOSIS — B370 Candidal stomatitis: Secondary | ICD-10-CM | POA: Diagnosis not present

## 2021-07-15 MED ORDER — NYSTATIN 100000 UNIT/ML MT SUSP: OROMUCOSAL | 0 refills | Status: DC

## 2021-07-15 NOTE — Progress Notes (Addendum)
[place] [address] Dept: [phone]  FOLLOW UP NOTE  Patient ID: Tara [name], female    DOB: Dec 20, 1955  Age: 65 y.o. MRN: [id number] Date of Office Visit: 07/15/2021  Assessment  Chief Complaint: Asthma  HPI Tara [name] is a 65 year old female who presents today for follow-up of moderate persistent asthma without complication, allergic rhinitis, gastroesophageal reflux disease, rash, and oral candidiasis.  She was last seen on April 10, 2021 by Althea Charon, FNP.  Since her last office visit she has not received any new diagnosis or had surgery.  Moderate persistent asthma is reported as moderately controlled with Singulair 10 mg once a day, Advair HFA 115 mcg 2 puffs twice a day with spacer, and albuterol as needed.  She reports a dry cough in the morning and throughout the day.  The cough is not more than normal.  She does not feel like that cough is always due to drainage.  She denies wheezing, tightness in her chest, shortness of breath, nocturnal awakenings due to breathing problems, and fever or chills.  She has had 1 steroid injection since her last office visit for nerve pain.  She has not used her albuterol inhaler since we last saw her. Her ACT score is 23. She also has questions about a cheaper alternative for Advair. She reports that in January she will pay $400.00 for Advair and the remaining months she pays $40.00. Discussed how decreasing her Advair HFA 115 mcg to 1 puff twice a day would help with cost.  Allergic rhinitis is reported as not well controlled with fluticasone nasal spray as needed, azelastine nasal spray as needed, and saline rinses as needed.  She reports that she takes Benadryl at night to help her sleep.  She reports nasal congestion and postnasal drip.  She denies rhinorrhea.  She has not had any sinus infections since we last saw her.  After speaking with [name] she was using incorrect technique with her fluticasone nasal  spray.  Reflux is reported as controlled with omeprazole 20 mg twice a day.  She denies any heartburn or reflux symptoms.  She reports that she has a follow-up with GI this Friday.  She reports the rash on her lower back and buttocks region is a little bit better after seeing her dermatologist.  She reports that they gave her a stronger cream that she does not know the name of.  Oral candidiasis is reported as not well controlled.  She reports that she continues to have candidiasis at times.  When she takes the nystatin swish and swallow it will eventually help, but will come back.  She reports that she is using a spacer with her Advair inhaler and rinses her mouth out afterwards.   Drug Allergies:  Allergies  Allergen Reactions   Aspirin Other (See Comments)    Pt has a hx of TTP.    Nsaids Other (See Comments)    Pt has a hx of TTP.     Review of Systems: Review of Systems  Constitutional:  Negative for chills and fever.  HENT:         Reports nasal congestion and postnasal drip.  Denies rhinorrhea.  Eyes:        Reports burning and tired eyes for which she uses Systane eyedrops.  She reports diagnosis of dry eyes.  Respiratory:  Positive for cough. Negative for shortness of breath and wheezing.   Cardiovascular:  Negative for chest pain and  palpitations.  Gastrointestinal:        Denies heartburn and reflux symptoms with omeprazole 20 mg twice daily.  Genitourinary:  Negative for frequency.       Reports bladder leakage  Skin:  Positive for itching and rash.  Neurological:  Negative for headaches.  Endo/Heme/Allergies:  Positive for environmental allergies.    Physical Exam: BP 130/60 (BP Location: Right Arm, Patient Position: Sitting, Cuff Size: Normal)   Pulse 84   Temp (!) 97.2 F (36.2 C) (Temporal)   Resp 20   SpO2 98%    Physical Exam Constitutional:      Appearance: Normal appearance.  HENT:     Head: Normocephalic and atraumatic.     Comments: Pharynx:  White patches noted on uvula and palate.  Eyes normal, ears normal, nose: Bilateral lower turbinates moderately edematous and slightly erythematous with clear drainage noted.    Right Ear: Tympanic membrane, ear canal and external ear normal.     Left Ear: Tympanic membrane, ear canal and external ear normal.  Neurological:     Mental Status: She is alert.    Diagnostics: FVC 2.60 L, FEV1 2.13 L.  Predicted FVC 3.27 L, predicted FEV1 2.53 L.  Spirometry indicates normal respiratory function.  Assessment and Plan: 1. Moderate persistent asthma without complication   2. Other allergic rhinitis   3. Cough, unspecified type   4. Oral candidiasis     Meds ordered this encounter  Medications   nystatin (MYCOSTATIN) 100000 UNIT/ML suspension    Sig: Take 5 ml 4 times a day for 7 days. Retain in mouth as long as possible    Dispense:  150 mL    Refill:  0     Patient Instructions  Asthma Continue montelukast 10 mg-take 1 tablet once a day to help prevent cough and wheeze. Decrease Advair 115- 1 puff twice a day with a spacer to prevent cough and wheeze. Rinse your mouth after with mouthwash after each use. Let's see if this helps decrease the thrush (yeast) in your mouth. If your asthma symptoms worsen increase your Advair 115 back to 2 puffs twice a day with spacer. Continue albuterol 2 puffs every 4 hours as needed for coughing, wheezing, tightness in chest, or shortness of breath.  Also, may use albuterol 2 puffs 5 to 15 minutes prior to exercise Try using the albuterol inhaler when you have coughing and see if this helps  Allergic rhinitis Stop Benadryl. Start Claritin 10 mg once a day Stop fluticasone nasal spray for now due to the thrush (yeast) in your mouth Continue azelastine 2 sprays in each nostril twice a day as needed.   Continue saline nasal rinses as needed for nasal symptoms. Use this before any medicated nasal sprays for best result  Reflux Continue omeprazole 20 mg  twice a day for control of reflux Continue dietary and lifestyle modifications as listed below  Rash Continue moisturizing Continue the medication as prescribed by your dermatologist  Oral candidiasis We will refill your Nystatin swish and swallow Let us know if this is not getting any better    Call the clinic if this treatment plan is not working well for you  Follow up in 4 weeks or sooner if needed  Return in about 4 weeks (around 08/12/2021), or if symptoms worsen or fail to improve.    Thank you for the opportunity to care for this patient.  Please do not hesitate to contact me with questions.  Althea Charon, FNP  [facility]

## 2021-07-16 LAB — CBC WITH DIFFERENTIAL/PLATELET
Absolute Monocytes: 541 cells/uL (ref 200–950)
Basophils Absolute: 48 cells/uL (ref 0–200)
Basophils Relative: 0.9 %
Eosinophils Absolute: 260 cells/uL (ref 15–500)
Eosinophils Relative: 4.9 %
HCT: 38 % (ref 35.0–45.0)
Hemoglobin: 12.5 g/dL (ref 11.7–15.5)
Lymphs Abs: 832 cells/uL — ABNORMAL LOW (ref 850–3900)
MCH: 30.4 pg (ref 27.0–33.0)
MCHC: 32.9 g/dL (ref 32.0–36.0)
MCV: 92.5 fL (ref 80.0–100.0)
MPV: 12.5 fL (ref 7.5–12.5)
Monocytes Relative: 10.2 %
Neutro Abs: 3620 cells/uL (ref 1500–7800)
Neutrophils Relative %: 68.3 %
Platelets: 148 10*3/uL (ref 140–400)
RBC: 4.11 10*6/uL (ref 3.80–5.10)
RDW: 13.5 % (ref 11.0–15.0)
Total Lymphocyte: 15.7 %
WBC: 5.3 10*3/uL (ref 3.8–10.8)

## 2021-07-16 LAB — HEMOGLOBIN A1C
Hgb A1c MFr Bld: 5.7 % of total Hgb — ABNORMAL HIGH (ref <5.7)
Mean Plasma Glucose: 117 mg/dL
eAG (mmol/L): 6.5 mmol/L

## 2021-07-16 LAB — COMPLETE METABOLIC PANEL WITH GFR
AG Ratio: 1.7 (calc) (ref 1.0–2.5)
ALT: 10 U/L (ref 6–29)
AST: 13 U/L (ref 10–35)
Albumin: 4.5 g/dL (ref 3.6–5.1)
Alkaline phosphatase (APISO): 52 U/L (ref 37–153)
BUN: 13 mg/dL (ref 7–25)
CO2: 30 mmol/L (ref 20–32)
Calcium: 8.5 mg/dL — ABNORMAL LOW (ref 8.6–10.4)
Chloride: 102 mmol/L (ref 98–110)
Creat: 0.92 mg/dL (ref 0.50–1.05)
Globulin: 2.7 g/dL (calc) (ref 1.9–3.7)
Glucose, Bld: 97 mg/dL (ref 65–99)
Potassium: 4.7 mmol/L (ref 3.5–5.3)
Sodium: 142 mmol/L (ref 135–146)
Total Bilirubin: 1 mg/dL (ref 0.2–1.2)
Total Protein: 7.2 g/dL (ref 6.1–8.1)
eGFR: 69 mL/min/{1.73_m2} (ref >=60)

## 2021-07-16 LAB — LIPID PANEL
Cholesterol: 183 mg/dL (ref <200)
HDL: 71 mg/dL (ref >=50)
LDL Cholesterol (Calc): 90 mg/dL (calc)
Non-HDL Cholesterol (Calc): 112 mg/dL (calc) (ref <130)
Total CHOL/HDL Ratio: 2.6 (calc) (ref <5.0)
Triglycerides: 122 mg/dL (ref <150)

## 2021-07-18 ENCOUNTER — Ambulatory Visit: Payer: Medicare Other | Admitting: Gastroenterology

## 2021-07-21 ENCOUNTER — Other Ambulatory Visit: Payer: Self-pay

## 2021-07-21 ENCOUNTER — Encounter: Payer: Self-pay | Admitting: Internal Medicine

## 2021-07-21 ENCOUNTER — Ambulatory Visit (INDEPENDENT_AMBULATORY_CARE_PROVIDER_SITE_OTHER): Payer: Medicare Other | Admitting: Internal Medicine

## 2021-07-21 VITALS — BP 128/84 | HR 74 | Temp 97.6°F | Ht 65.5 in | Wt 250.0 lb

## 2021-07-21 DIAGNOSIS — E1169 Type 2 diabetes mellitus with other specified complication: Secondary | ICD-10-CM | POA: Diagnosis not present

## 2021-07-21 DIAGNOSIS — E782 Mixed hyperlipidemia: Secondary | ICD-10-CM

## 2021-07-21 DIAGNOSIS — J4520 Mild intermittent asthma, uncomplicated: Secondary | ICD-10-CM

## 2021-07-21 DIAGNOSIS — Z853 Personal history of malignant neoplasm of breast: Secondary | ICD-10-CM

## 2021-07-21 DIAGNOSIS — Z8619 Personal history of other infectious and parasitic diseases: Secondary | ICD-10-CM

## 2021-07-21 DIAGNOSIS — E039 Hypothyroidism, unspecified: Secondary | ICD-10-CM

## 2021-07-21 DIAGNOSIS — Z6841 Body Mass Index (BMI) 40.0 and over, adult: Secondary | ICD-10-CM | POA: Diagnosis not present

## 2021-07-21 DIAGNOSIS — F32A Depression, unspecified: Secondary | ICD-10-CM

## 2021-07-21 DIAGNOSIS — M546 Pain in thoracic spine: Secondary | ICD-10-CM

## 2021-07-21 DIAGNOSIS — F419 Anxiety disorder, unspecified: Secondary | ICD-10-CM

## 2021-07-21 NOTE — Progress Notes (Signed)
   Subjective:    Patient ID: Tara [name], female    DOB: 13-Apr-1956, 65 y.o.   MRN: [id number]  HPI 65 year old Female seen for 6 month follow up today.  She has a history of moderate persistent Asthma and Allergic rhinitis on multiple medications per Dr. Verlin Fester, Allergist.  History of impaired glucose tolerance treated with metformin.  Longstanding history of obesity.  History of hypothyroidism treated with thyroid replacement medication, levothyroxine 0.8 mg daily with normal TSH when checked in May.  History of depression treated with Celexa.  History of hyperlipidemia.  Takes generic Crestor 5 mg daily.  Her hemoglobin A1c is excellent at 5.7% level  was a little better in May at 5.5%.  Review of Systems having steroid injection per Dr. Brien Few q 6-9 months and this is helping chronic left thoracic radiculopathy.  Immunizations reviewed and are mostly up to date but she does not want Shingrix at this time  Not really motivated to diet and exercise.  This is been discussed repeatedly.     Objective:   Physical Exam  Has gained 6 pounds since May. Diet and exercise discussed. No thyromegaly.  Chest clear to auscultation.  Cardiac exam: Regular rate and rhythm.  Trace lower extremity edema. Blood pressure is 128/84 weight is 250 pounds BMI is 40.97     Assessment & Plan:  Morbid obesity-needs to be serious about diet exercise and weight loss efforts due to multiple comorbidities including diabetic hyperlipidemia and type 2 diabetes mellitus.  Is on metformin.  Hemoglobin A1c 5.7%  History of left breast cancer in the remote past  History of left thoracic radiculopathy improving with treatment by Dr. Brien Few.  Anxiety and depression  Dependent edema  History of bilateral hip replacements  Remote history of hepatitis C treated with Harvoni  Hypothyroidism treated with thyroid replacement medication and stable  Hyperlipidemia stable on low-dose Crestor.  Lipid panel is  normal.  Liver functions are normal on statin medication with history in the remote past of Hepatitis C  Plan: Overall I think she is doing fairly well with multiple medical issues.  I would like for her to be serious about diet exercise and weight loss.  She will continue with current medications and follow-up for health maintenance exam and Medicare wellness in 6 months.

## 2021-07-22 LAB — MICROALBUMIN / CREATININE URINE RATIO
Creatinine, Urine: 37 mg/dL (ref 20–275)
Microalb, Ur: <0.2 mg/dL

## 2021-08-05 ENCOUNTER — Telehealth: Payer: Self-pay

## 2021-08-05 ENCOUNTER — Other Ambulatory Visit: Payer: Medicare Other

## 2021-08-05 ENCOUNTER — Ambulatory Visit (INDEPENDENT_AMBULATORY_CARE_PROVIDER_SITE_OTHER): Payer: Medicare Other | Admitting: Nurse Practitioner

## 2021-08-05 ENCOUNTER — Encounter: Payer: Self-pay | Admitting: Nurse Practitioner

## 2021-08-05 VITALS — BP 110/80 | HR 82 | Ht 66.0 in | Wt 255.0 lb

## 2021-08-05 DIAGNOSIS — K7030 Alcoholic cirrhosis of liver without ascites: Secondary | ICD-10-CM

## 2021-08-05 DIAGNOSIS — K3184 Gastroparesis: Secondary | ICD-10-CM

## 2021-08-05 DIAGNOSIS — R932 Abnormal findings on diagnostic imaging of liver and biliary tract: Secondary | ICD-10-CM

## 2021-08-05 DIAGNOSIS — K7400 Hepatic fibrosis, unspecified: Secondary | ICD-10-CM | POA: Diagnosis not present

## 2021-08-05 LAB — HEPATITIS A ANTIBODY, TOTAL: Hepatitis A AB,Total: NONREACTIVE

## 2021-08-05 LAB — HEPATITIS B SURFACE ANTIBODY,QUALITATIVE: Hep B S Ab: NONREACTIVE

## 2021-08-05 MED ORDER — FAMOTIDINE 20 MG PO TABS: 20.0000 mg | ORAL_TABLET | Freq: Two times a day (BID) | ORAL | 1 refills | Status: DC

## 2021-08-05 NOTE — Telephone Encounter (Signed)
Patient had labs ordered today at her visit, she also had future orders from April that needed drawn but lab did not pull those orders and can not add to what they drew today. Patient will need to return to the lab for AFP and PT INR.  I have DC/d the two orders from April so as not to confuse the lab, and placed new orders. I have contacted the patient and let her know what happened and that she will need to stop back by the lab. Patient expressed understanding and agreement and will come by in a day or two.

## 2021-08-05 NOTE — Patient Instructions (Addendum)
LABS:  Lab work has been ordered for you today. Our lab is located in the basement. Press "B" on the elevator. The lab is located at the first door on the left as you exit the elevator.  HEALTHCARE LAWS AND MY CHART RESULTS: Due to recent changes in healthcare laws, you may see the results of your imaging and laboratory studies on MyChart before your provider has had a chance to review them.   We understand that in some cases there may be results that are confusing or concerning to you. Not all laboratory results come back in the same time frame and the provider may be waiting for multiple results in order to interpret others.  Please give [name] 48 hours in order for your provider to thoroughly review all the results before contacting the office for clarification of your results.   We have refilled your Famotidine. Avoid alcohol. Follow up with Dr. Tarri Glenn in 6 months. Our office will contact you to set up that appointment.  It was great seeing you today! Thank you for entrusting me with your care and choosing [facility].  Tye Savoy, NP

## 2021-08-05 NOTE — Progress Notes (Signed)
ASSESSMENT AND PLAN    #65 year old female with advanced liver fibrosis due to history of HCV / +/- steatosis. No history of decompensation --She had a CBC, CMP drawn on 07/15/21 but needs an INR to calculate her MELD --Will order an INR for today.  --[place] screening :  No liver masses on recent MRI --Obtain AFP today --Varices screening: No esophageal varices or portal hypertensive gastropathy on last EGD December 2021.  Surveillance EGD 2023.  ---Had HAV and HBV vaccines in 2019. Will obtain labs to check for immunity  #Gastroparesis.  Gastric emptying scan March 2021 with abnormal emptying at 4 hours . No recent nausea or vomiting.  --She is here for refill on Famotidine BID. Takes BID Prilosec as well.   #History of recurrent colon polyps including a large hepatic flexure TVA, SSP and inflammatory polyps.  Due for surveillance colonoscopy December 2024   HISTORY OF PRESENT ILLNESS    Chief Complaint : needs refill on medication  Tara [name] is a 65 y.o. female , known to Dr. ,  with a past medical history  of advanced liver fibrosis, HCV, gastroparesis, obesity, hypothyroidism colon polyps ( TVA, sessile serrated, inflammatory), kidney cysts .  Additional medical history as listed in [place] .   Patient was last seen 12/02/20 for follow-up of her chronic liver disease, please refer to that note.  She is here for medication refill but was also advised to follow-up in 6 months   Tara [name] has advanced fibrosis secondary to history of HCV / possibly steatosis.  She had SVR after 12 weeks of Harvoni.  She has a history of multiple liver cysts and lymphadenopathy complicating imaging studies.  Therefore we have alternated between liver MRI and liver CT every 6 months.  As recommended she underwent MR in August with findings of stable tiny subcentimeter hepatic cyst.  No evidence of hepatic neoplasm, also renal cysts were stable, see full report below.   Tara [name] feels okay, she has no abdominal  pain, nausea nor vomiting.  No blood in stool.  She is still taking twice daily omeprazole and twice daily famotidine.    Recent Imaging:   August 2022 MRI abdomen without and with contrast EXAM: MRI ABDOMEN WITHOUT AND WITH CONTRAST IMPRESSION: Stable tiny sub-cm hepatic cysts. No evidence of hepatic neoplasm or biliary ductal dilatation.Stable benign Bosniak category 1 and 2 renal cysts. No evidence of renal neoplasm or hydronephrosis.      Current Medications, Allergies, Past Medical History, Past Surgical History, Family History and Social History were reviewed in [employer] record.     Current Outpatient Medications  Medication Sig Dispense Refill   acetaminophen (TYLENOL) 500 MG tablet Take 500 mg by mouth every 6 (six) hours as needed for mild pain, moderate pain or headache.      albuterol (VENTOLIN HFA) 108 (90 Base) MCG/ACT inhaler Two puffs every 4 hours if needed for wheezing or coughing .  May use 2 puffs 5-15 minutes prior to exercise. 18 g 1   augmented betamethasone dipropionate (DIPROLENE-AF) 0.05 % cream Apply topically.     azelastine (ASTELIN) 0.1 % nasal spray Place 1-2 sprays into both nostrils 2 (two) times daily as needed. 30 mL 5   famotidine (PEPCID) 20 MG tablet TAKE ONE TABLET BY MOUTH TWICE A DAY 60 tablet 0   fluticasone-salmeterol (ADVAIR HFA) 115-21 MCG/ACT inhaler INHALE TWO PUFFS BY MOUTH TWICE A DAY 12 g 0   levothyroxine (SYNTHROID) 88 MCG tablet  TAKE ONE TABLET BY MOUTH DAILY 90 tablet 1   metFORMIN (GLUCOPHAGE) 500 MG tablet TAKE ONE TABLET BY MOUTH TWICE A DAY WITH MEALS 180 tablet 2   montelukast (SINGULAIR) 10 MG tablet TAKE ONE TABLET BY MOUTH EVERY NIGHT AT BEDTIME 90 tablet 3   nystatin (MYCOSTATIN) 100000 UNIT/ML suspension Take 5 ml 4 times a day for 7 days. Retain in mouth as long as possible 150 mL 0   omeprazole (PRILOSEC) 20 MG capsule TAKE ONE CAPSULE BY MOUTH TWICE A DAY BEFORE MEALS 60 capsule 0   Probiotic  Product (PROBIOTIC DAILY PO) Take 1 mg by mouth daily.     rosuvastatin (CRESTOR) 5 MG tablet Take 1 tablet (5 mg total) by mouth daily. 90 tablet 1   Spacer/Aero-Holding Chambers (AEROCHAMBER MV) inhaler Use as instructed 1 each 2   triamcinolone ointment (KENALOG) 0.1 % Apply 1 application topically 2 (two) times daily. Apply twice daily to red itchy areas below the face. 45 g 3   No current facility-administered medications for this visit.    Review of Systems: No chest pain. No shortness of breath. No urinary complaints.   PHYSICAL EXAM :    Wt Readings from Last 3 Encounters:  08/05/21 255 lb (115.7 kg)  07/21/21 250 lb (113.4 kg)  04/15/21 246 lb (111.6 kg)    BP 110/80   Pulse 82   Ht 5\' 6"  (1.676 m)   Wt 255 lb (115.7 kg)   BMI 41.16 kg/m  Constitutional:  Generally well appearing female in no acute distress. Psychiatric: Pleasant. Normal mood and affect. Behavior is normal. EENT: Pupils normal.  Conjunctivae are normal. No scleral icterus. Neck supple.  Cardiovascular: Normal rate, regular rhythm. No edema Pulmonary/chest: Effort normal and breath sounds normal. No wheezing, rales or rhonchi. Abdominal: Soft, nondistended, nontender. Bowel sounds active throughout. There are no masses palpable. No hepatomegaly. Neurological: Alert and oriented to person place and time.  No asterixis Skin: Skin is warm and dry. No rashes noted.  Tye Savoy, NP  08/05/2021, 10:44 AM

## 2021-08-07 ENCOUNTER — Other Ambulatory Visit (INDEPENDENT_AMBULATORY_CARE_PROVIDER_SITE_OTHER): Payer: Medicare Other

## 2021-08-07 ENCOUNTER — Other Ambulatory Visit: Payer: Self-pay

## 2021-08-07 DIAGNOSIS — K7030 Alcoholic cirrhosis of liver without ascites: Secondary | ICD-10-CM

## 2021-08-07 DIAGNOSIS — R932 Abnormal findings on diagnostic imaging of liver and biliary tract: Secondary | ICD-10-CM

## 2021-08-07 LAB — PROTIME-INR
INR: 0.9 ratio (ref 0.8–1.0)
Prothrombin Time: 10.3 s (ref 9.6–13.1)

## 2021-08-07 MED ORDER — ADVAIR HFA 115-21 MCG/ACT IN AERO: 1.0000 | INHALATION_SPRAY | Freq: Two times a day (BID) | RESPIRATORY_TRACT | 3 refills | Status: DC

## 2021-08-07 MED ORDER — OMEPRAZOLE 20 MG PO CPDR: DELAYED_RELEASE_CAPSULE | ORAL | 5 refills | Status: DC

## 2021-08-07 NOTE — Telephone Encounter (Signed)
Refill of omeprazole sent to pharmacy.

## 2021-08-08 ENCOUNTER — Other Ambulatory Visit: Payer: Self-pay

## 2021-08-08 DIAGNOSIS — R932 Abnormal findings on diagnostic imaging of liver and biliary tract: Secondary | ICD-10-CM

## 2021-08-08 DIAGNOSIS — K7460 Unspecified cirrhosis of liver: Secondary | ICD-10-CM

## 2021-08-08 LAB — AFP TUMOR MARKER: AFP-Tumor Marker: 2.8 ng/mL

## 2021-08-08 NOTE — Progress Notes (Signed)
Thank you :)

## 2021-08-08 NOTE — Progress Notes (Signed)
MRI liver ordered to be completed Mar 2023 w/ updated CMP to be completed Feb 2023. Reminder created to call pt to schedule 6 mo f/u once schedule is available. Routing this message to Tye Savoy, NP and Dr. Tarri Glenn for continuity of care purposes.

## 2021-08-13 NOTE — Patient Instructions (Addendum)
Asthma Continue montelukast 10 mg-take 1 tablet once a day to help prevent cough and wheeze. Continue  Advair 115- 2 puffs twice a day with a spacer to prevent cough and wheeze. Rinse your mouth after with  an alcohol based mouthwash after each use.  Continue albuterol 2 puffs every 4 hours as needed for coughing, wheezing, tightness in chest, or shortness of breath.  Also, may use albuterol 2 puffs 5 to 15 minutes prior to exercise. Consider a biologic to help with your asthma such as Dupixent, Berna Bue, or Nucala. Her absolute eosinophils on July 15, 2021 were 260.  Information given.  Please give our office a call if you would like to start 1 of these medications.  Allergic rhinitis (fluticasone nasal spray stopped due to thrush in mouth) Continue Claritin 10 mg once a day Try increasing azelastine 2 sprays in each nostril twice a day to see if this helps with your drainage.  If you continue to have cough we will consider referring you to ENT Continue saline nasal rinses as needed for nasal symptoms. Use this before any medicated nasal sprays for best result  Reflux Continue omeprazole 20 mg twice a day for control of reflux Continue famotidine twice a day Continue dietary and lifestyle modifications as listed below  Rash Continue moisturizing Continue the medication as prescribed by your dermatologist Recommend following back up with dermatology and possibly getting a biopsy since the rash is still there.  Oral candidiasis We will refill your nystatin swish and swallow     Call the clinic if this treatment plan is not working well for you  Follow up in 4-6 weeks or sooner if needed

## 2021-08-14 ENCOUNTER — Ambulatory Visit (INDEPENDENT_AMBULATORY_CARE_PROVIDER_SITE_OTHER): Payer: Medicare Other | Admitting: Family

## 2021-08-14 ENCOUNTER — Other Ambulatory Visit: Payer: Self-pay

## 2021-08-14 ENCOUNTER — Encounter: Payer: Self-pay | Admitting: Family

## 2021-08-14 VITALS — BP 138/76 | HR 78 | Temp 97.9°F | Resp 18 | Ht 67.0 in | Wt 257.4 lb

## 2021-08-14 DIAGNOSIS — B370 Candidal stomatitis: Secondary | ICD-10-CM

## 2021-08-14 DIAGNOSIS — J4540 Moderate persistent asthma, uncomplicated: Secondary | ICD-10-CM

## 2021-08-14 DIAGNOSIS — R21 Rash and other nonspecific skin eruption: Secondary | ICD-10-CM

## 2021-08-14 DIAGNOSIS — J3089 Other allergic rhinitis: Secondary | ICD-10-CM | POA: Diagnosis not present

## 2021-08-14 DIAGNOSIS — R059 Cough, unspecified: Secondary | ICD-10-CM | POA: Diagnosis not present

## 2021-08-14 DIAGNOSIS — K219 Gastro-esophageal reflux disease without esophagitis: Secondary | ICD-10-CM

## 2021-08-14 MED ORDER — NYSTATIN 100000 UNIT/ML MT SUSP: OROMUCOSAL | 1 refills | Status: DC

## 2021-08-14 NOTE — Progress Notes (Signed)
[place] [id number] Dept: [phone]  FOLLOW UP NOTE  Patient ID: Tara [name], female    DOB: April 20, 1956  Age: 65 y.o. MRN: [id number] Date of Office Visit: 08/14/2021  Assessment  Chief Complaint: Follow-up (Pt states she have been coughing some, and shes been trying not to overuse her inhaler), Nasal Congestion, and Asthma  HPI Tara [name] is a 65 year old female who presents today for follow-up of moderate persistent asthma without complication, allergic rhinitis, cough, oral candidiasis, gastroesophageal reflux disease, and rash.  She was last seen on July 15, 2021 by Althea Charon, FNP.  Since her last office visit she denies any new diagnosis or surgeries.  Asthma is reported as not well controlled since decreasing Advair 115 mcg to 1 puff twice a day with spacer.  She reports that a couple days ago she increased her Advair back to 2 puffs twice a day due to her cough getting worse.  She also continues to take Singulair 10 mg once a day and albuterol as needed.  She reports a cough that is more dry and denies wheezing, tightness in her chest, shortness of breath, and nocturnal awakenings.  Since her last office visit she not received any systemic steroids or made any trips to the emergency room or urgent care.  She is not had to use her albuterol inhaler since we last saw her.  When asked if albuterol helps her cough.  She reports that she forgot to try this.  Allergic rhinitis is reported as not well controlled with Claritin 10 mg once a day, azelastine 2 sprays each nostril once a day as needed, and saline nasal rinse as needed.  She did stop the fluticasone nasal spray due to the thrush in her mouth.  She reports nasal congestion and postnasal drip in the morning mainly.  She denies rhinorrhea.  She has not had any sinus infections since we last saw her.  Reflux is reported as controlled with omeprazole 20 mg twice a day and famotidine twice a day.  She  denies any heartburn or reflux symptoms.  She last saw gastroenterology on August 05, 2021.  Rash is reported as still present but better with betamethasone that was prescribed by her dermatologist.  She is requesting a refill.  Instructed her that I would like for her to go back to dermatology for possible biopsy since the rash is still present.  She verbalizes understanding.  Oral candidiasis is reported as may be better since decreasing her Advair HFA to 1 puff twice a day.  She currently does not think she has oral candidiasis right now but is not certain.  She says that she has not used nystatin in may be a week.  Discussed how we could try adding on an asthma biologic in hopes of being able to decrease the amount of inhaled corticosteroid she needs to help control her asthma.   Drug Allergies:  Allergies  Allergen Reactions   Aspirin Other (See Comments)    Pt has a hx of TTP.    Nsaids Other (See Comments)    Pt has a hx of TTP.     Review of Systems: Review of Systems  Constitutional:  Negative for chills and fever.  HENT:         Reports nasal congestion and postnasal drip mainly in the morning.  Denies rhinorrhea  Eyes:        Denies itchy watery eyes.  Reports burning eyes at  times.  She reports that she is up-to-date on her eye examination.  Respiratory:  Positive for cough. Negative for shortness of breath and wheezing.        Reports cough that she feels like is more to clearing her throat/drainage.  Denies wheezing, tightness in her chest, shortness of breath, nocturnal awakenings due to breathing problems  Cardiovascular:  Negative for chest pain and palpitations.  Gastrointestinal:        Denies heartburn and reflux symptoms as long as she takes omeprazole 20 mg twice a day and famotidine twice a day  Genitourinary:  Positive for frequency.       Reports bladder leakage.  She is not ready to have surgery for this yet  Skin:  Positive for itching and rash.        Reports that she continues to have the rash on her back that is some better since starting betamethasone from her dermatologist  Neurological:  Negative for headaches.  Endo/Heme/Allergies:  Positive for environmental allergies.    Physical Exam: BP 138/76    Pulse 78    Temp 97.9 F (36.6 C) (Temporal)    Resp 18    Ht 5\' 7"  (1.702 m)    Wt 257 lb 6.4 oz (116.8 kg)    BMI 40.31 kg/m    Physical Exam Constitutional:      Appearance: Normal appearance.  HENT:     Head: Normocephalic and atraumatic.     Comments: Pharynx White patches noted on palate.  Eyes normal, ears normal, nose bilateral lower turbinates moderately edematous and slightly erythematous with no drainage noted    Right Ear: Tympanic membrane, ear canal and external ear normal.     Left Ear: Tympanic membrane, ear canal and external ear normal.     Mouth/Throat:     Mouth: Mucous membranes are moist.     Pharynx: Oropharynx is clear.  Eyes:     Conjunctiva/sclera: Conjunctivae normal.  Cardiovascular:     Rate and Rhythm: Regular rhythm.     Heart sounds: Normal heart sounds.  Pulmonary:     Effort: Pulmonary effort is normal.     Breath sounds: Normal breath sounds.     Comments: Lungs clear to auscultation Musculoskeletal:     Cervical back: Neck supple.  Skin:    General: Skin is warm.     Comments: Small slightly erythematous papules noted above center of buttocks and center of the back.  Scab noted left mid back region  Neurological:     Mental Status: She is alert and oriented to person, place, and time.  Psychiatric:        Mood and Affect: Mood normal.        Behavior: Behavior normal.        Thought Content: Thought content normal.        Judgment: Judgment normal.    Diagnostics: FVC 2.16 L, FEV1 1.96 L.  Predicted FVC 3.27 L, predicted FEV1 2.53 L.  Spirometry indicates possible mild restriction.  Assessment and Plan: 1. Moderate persistent asthma without complication   2. Oral candidiasis    3. Other allergic rhinitis   4. Cough, unspecified type   5. Rash   6. Gastroesophageal reflux disease without esophagitis     Meds ordered this encounter  Medications   nystatin (MYCOSTATIN) 100000 UNIT/ML suspension    Sig: Take 5 ml 4 times a day for 7 days. Retain in mouth as long as possible    Dispense:  150 mL    Refill:  1     Patient Instructions  Asthma Continue montelukast 10 mg-take 1 tablet once a day to help prevent cough and wheeze. Continue  Advair 115- 2 puffs twice a day with a spacer to prevent cough and wheeze. Rinse your mouth after with  an alcohol based mouthwash after each use.  Continue albuterol 2 puffs every 4 hours as needed for coughing, wheezing, tightness in chest, or shortness of breath.  Also, may use albuterol 2 puffs 5 to 15 minutes prior to exercise. Consider a biologic to help with your asthma such as Dupixent, Berna Bue, or Nucala. Her absolute eosinophils on July 15, 2021 were 260.  Information given.  Please give our office a call if you would like to start 1 of these medications.  Allergic rhinitis (fluticasone nasal spray stopped due to thrush in mouth) Continue Claritin 10 mg once a day Try increasing azelastine 2 sprays in each nostril twice a day to see if this helps with your drainage.  If you continue to have cough we will consider referring you to ENT Continue saline nasal rinses as needed for nasal symptoms. Use this before any medicated nasal sprays for best result  Reflux Continue omeprazole 20 mg twice a day for control of reflux Continue famotidine twice a day Continue dietary and lifestyle modifications as listed below  Rash Continue moisturizing Continue the medication as prescribed by your dermatologist Recommend following back up with dermatology and possibly getting a biopsy since the rash is still there.  Oral candidiasis We will refill your nystatin swish and swallow     Call the clinic if this treatment plan is  not working well for you  Follow up in 4-6 weeks or sooner if needed  Return in about 4 weeks (around 09/11/2021), or if symptoms worsen or fail to improve.    Thank you for the opportunity to care for this patient.  Please do not hesitate to contact me with questions.  Althea Charon, FNP [facility]

## 2021-08-18 ENCOUNTER — Other Ambulatory Visit: Payer: Self-pay

## 2021-08-28 ENCOUNTER — Other Ambulatory Visit: Payer: Self-pay | Admitting: *Deleted

## 2021-08-28 DIAGNOSIS — R932 Abnormal findings on diagnostic imaging of liver and biliary tract: Secondary | ICD-10-CM

## 2021-08-28 DIAGNOSIS — K7030 Alcoholic cirrhosis of liver without ascites: Secondary | ICD-10-CM

## 2021-08-28 MED ORDER — OMEPRAZOLE 20 MG PO CPDR: DELAYED_RELEASE_CAPSULE | ORAL | 1 refills | Status: DC

## 2021-08-30 NOTE — Patient Instructions (Addendum)
It was a pleasure to see you today.  Please continue current medications.  Please try to be motivated about diet exercise and weight loss.  Return in 6 months for Medicare wellness visit health maintenance exam and fasting labs.

## 2021-09-16 ENCOUNTER — Other Ambulatory Visit: Payer: Self-pay | Admitting: Internal Medicine

## 2021-09-17 ENCOUNTER — Other Ambulatory Visit: Payer: Self-pay | Admitting: Internal Medicine

## 2021-09-17 ENCOUNTER — Ambulatory Visit (INDEPENDENT_AMBULATORY_CARE_PROVIDER_SITE_OTHER): Payer: Medicare Other

## 2021-09-17 DIAGNOSIS — K7460 Unspecified cirrhosis of liver: Secondary | ICD-10-CM

## 2021-09-17 DIAGNOSIS — Z23 Encounter for immunization: Secondary | ICD-10-CM

## 2021-09-23 ENCOUNTER — Encounter: Payer: Self-pay | Admitting: Internal Medicine

## 2021-09-23 ENCOUNTER — Other Ambulatory Visit: Payer: Self-pay

## 2021-09-23 ENCOUNTER — Ambulatory Visit (INDEPENDENT_AMBULATORY_CARE_PROVIDER_SITE_OTHER): Payer: Medicare Other | Admitting: Internal Medicine

## 2021-09-23 ENCOUNTER — Telehealth: Payer: Self-pay | Admitting: *Deleted

## 2021-09-23 VITALS — BP 132/78 | HR 101 | Temp 97.9°F | Resp 17 | Ht 67.0 in | Wt 254.6 lb

## 2021-09-23 DIAGNOSIS — J4540 Moderate persistent asthma, uncomplicated: Secondary | ICD-10-CM

## 2021-09-23 DIAGNOSIS — B370 Candidal stomatitis: Secondary | ICD-10-CM | POA: Diagnosis not present

## 2021-09-23 DIAGNOSIS — J3089 Other allergic rhinitis: Secondary | ICD-10-CM | POA: Diagnosis not present

## 2021-09-23 DIAGNOSIS — H1045 Other chronic allergic conjunctivitis: Secondary | ICD-10-CM

## 2021-09-23 DIAGNOSIS — K219 Gastro-esophageal reflux disease without esophagitis: Secondary | ICD-10-CM

## 2021-09-23 MED ORDER — ADVAIR HFA 115-21 MCG/ACT IN AERO: 1.0000 | INHALATION_SPRAY | Freq: Two times a day (BID) | RESPIRATORY_TRACT | 3 refills | Status: DC

## 2021-09-23 MED ORDER — ALBUTEROL SULFATE HFA 108 (90 BASE) MCG/ACT IN AERS: INHALATION_SPRAY | RESPIRATORY_TRACT | 1 refills | Status: DC

## 2021-09-23 MED ORDER — TRIAMCINOLONE ACETONIDE 0.1 % EX OINT: 1.0000 "application " | TOPICAL_OINTMENT | Freq: Two times a day (BID) | CUTANEOUS | 3 refills | Status: DC

## 2021-09-23 MED ORDER — MONTELUKAST SODIUM 10 MG PO TABS: 10.0000 mg | ORAL_TABLET | Freq: Every day | ORAL | 3 refills | Status: DC

## 2021-09-23 NOTE — Telephone Encounter (Signed)
Called patient to discuss Tara [name] as add-on therapy for her asthma. Per patient Ins MCR/AArp she would be buy and bill through her medical with them paying 80/20 and $0 out of pocket to patient so she does want to proceed. Per Dr Edison Pace she should wait one week after her shingles vaccine so she has scheduled to start 2/13

## 2021-09-23 NOTE — Patient Instructions (Addendum)
Moderate persistent asthma: moderately well controlled  - Breathing test today showed: your lungs look good! - Based on symptoms and breathing tests your asthma is moderately well controlled, however given complications with thrush and we need to start biologic therapy with Gennie Alma 30mg  every 4 weeks for first 3 injections, then they move to every 8 weeks. -Get your shingles vaccines.   PLAN:  - Daily controller medication(s): Singulair 10mg  daily and Advair 115/44mcg two puffs twice daily with spacer - Prior to physical activity: albuterol 2 puffs 10-15 minutes before physical activity. - Rescue medications: albuterol 4 puffs every 4-6 hours as needed - Get Influenza Vaccine and appropriate Pneumonia and COVID 19 boosters  - Asthma control goals:  * Full participation in all desired activities (may need albuterol before activity) * Albuterol use two time or less a week on average (not counting use with activity) * Cough interfering with sleep two time or less a month * Oral steroids no more than once a year * No hospitalizations  Allergic Rhinitis  -Continue avoidance measures - Continue with: Claritin (loratadine) 10mg  tablet once daily, Singulair (montelukast) 10mg  daily, and Astelin (azelastine) 2 sprays per nostril 1-2 times daily as needed - You can use an extra dose of the antihistamine, if needed, for breakthrough symptoms.  - Consider nasal saline rinses 1-2 times daily to remove allergens from the nasal cavities as well as help with mucous clearance (this is especially helpful to do before the nasal sprays are given) - Consider allergy shots as a means of long-term control and can reduce lifetime use of medications  - Allergy shots "re-train" and "reset" the immune system to ignore environmental allergens and decrease the resulting immune response to those allergens (sneezing, itchy watery eyes, runny nose, nasal congestion, etc).    - Allergy shots improve symptoms in 75-85%  -  Allergy shots are the only potential permanent and disease modifying option  - We can discuss more at the next appointment if the medications are not working for you.   Atopic dermatitis: -Continue moisturizers, avoidance of fragrances and dyes, gentle skin care - Continue betamethasone topical as prescribed by dermatology - Continue to follow with dermatology  Reflux -Continue lifestyle and dietary modifications - Continue omeprazole 20 mg twice a day and famotidine twice a day - Continue to follow with gastroenterology  Oral candidiasis - We will continue to try to minimize inhaled and nasal corticosteroids -Continue nystatin swish and swallow as needed  Follow up: for faesenra injection, we will reach out to you to schedule and with me in 3 months for routine follow up.   Thank you so much for letting me partake in your care today.  Don't hesitate to reach out if you have any additional concerns!  Roney Marion, MD  [facility] [place] [place]

## 2021-09-23 NOTE — Telephone Encounter (Signed)
-----   Message from Roney Marion, MD sent at 09/23/2021 11:59 AM EST ----- Tara [name] start.  AEC 260.  Not tolerating high dose advair due to persistent thrush. Thanks!

## 2021-09-23 NOTE — Progress Notes (Signed)
Follow Up Note  RE: Tara [name] MRN: [id number] DOB: 1956-05-08 Date of Office Visit: 09/23/2021  Referring provider: [name], MD Primary care provider: [name], MD  Chief Complaint: Follow-up (Pt states that she have been doing okay, but with the weather changes she have SOB, coughing and congested.) and Asthma  History of Present Illness: I had the pleasure of seeing Tara [name] for a follow up visit at the [facility] on 09/23/2021. She is a 66 y.o. female, who is being followed for persistent asthma, allergic rhinitis, chronic cough, oral candidiasis, reflux. Her previous allergy office visit was on 08/14/2021 with Althea Charon FNP.  At last visit patient was given information for biologic therapy given recurrent oral candidiasis restricting higher dose use of inhaled corticosteroids.  Carteret on 07/15/2021 was 260 . today is a regular follow up visit.  1) Asthma: Since last visit symptoms have stayed the same -In the past month reports daytime symptoms 0 per week  and nighttime symptoms  0 per week  -Limitations to daily activity: mild -0 ED visit, 0 UC visits 0 hospitalizations since last visit -0 oral steroids and 0 antibiotics for airway illness since last visit. -Up-to-date with pneumonia, Covid-19, and Flu vaccines. -Smoking exposure: Denies -Today's Asthma Control Test:  .   -Current Regimen: Singulair 10 mg daily, Advair 115 mcg 2 puffs twice a day, albuterol as needed -Previous FEV1 % 1.96L at last visit -Total corticosteroid use in past year 0 -Adverse effects of medications :  Treatment complicated by recurrent oral candidiasis with high-dose Advair. -Dexa and Cataract Screening: not indicated  2) allergic rhinitis: current therapy: Claritin 10 mg daily, Astelin 2 sprays per nostril twice a day.  Flonase discontinued due to oral candidiasis,  symptoms partially improved: worsening nasal congestion with the change in weather  symptoms  include: nasal congestion, rhinorrhea, post nasal drainage, sneezing, watery eyes, itchy eyes, and itchy nose Previous allergy testing:  Yes, needs updating History of reflux/heartburn:  Yes well controlled on 20 mg of omeprazole twice a day and famotidine twice a day she is followed by GI Interested in Allergy Immunotherapy: no  Atopic dermatitis: flares mostly on back and intragluteal cleft, current regimen betamethasone for flares, reports avoidance of fragrance/dye free products,  sleep not affected She is followed by dermatology and plan at last visit was for follow back with dermatology for possible biopsy given persistent dermatitis non responsive to topical steroids.  She has not made appointment yt.   Oral candidiasis: Current treatment is nystatin swish and swallow.  Today she reports recurrence of thrush but has not start nystatin swish and swallow yet   Assessment and Plan: Tara [name] is a 66 y.o. female with: Moderate persistent asthma without complication - Plan: Spirometry with Graph  Other allergic rhinitis  Oral candidiasis  Chronic allergic conjunctivitis  Gastroesophageal reflux disease without esophagitis Plan: Patient Instructions  Moderate persistent asthma: moderately well controlled  - Breathing test today showed: your lungs look good! - Based on symptoms and breathing tests your asthma is moderately well controlled, however given complications with thrush and we need to start biologic therapy with Gennie Alma 30mg  every 4 weeks for first 3 injections, then they move to every 8 weeks. -Get your shingles vaccines.   PLAN:  - Daily controller medication(s): Singulair 10mg  daily and Advair 115/75mcg two puffs twice daily with spacer - Prior to physical activity: albuterol 2 puffs 10-15 minutes before physical activity. - Rescue medications:  albuterol 4 puffs every 4-6 hours as needed - Get Influenza Vaccine and appropriate Pneumonia and COVID 19 boosters  - Asthma control  goals:  * Full participation in all desired activities (may need albuterol before activity) * Albuterol use two time or less a week on average (not counting use with activity) * Cough interfering with sleep two time or less a month * Oral steroids no more than once a year * No hospitalizations  Allergic Rhinitis  -Continue avoidance measures - Continue with: Claritin (loratadine) 10mg  tablet once daily, Singulair (montelukast) 10mg  daily, and Astelin (azelastine) 2 sprays per nostril 1-2 times daily as needed - You can use an extra dose of the antihistamine, if needed, for breakthrough symptoms.  - Consider nasal saline rinses 1-2 times daily to remove allergens from the nasal cavities as well as help with mucous clearance (this is especially helpful to do before the nasal sprays are given) - Consider allergy shots as a means of long-term control and can reduce lifetime use of medications  - Allergy shots "re-train" and "reset" the immune system to ignore environmental allergens and decrease the resulting immune response to those allergens (sneezing, itchy watery eyes, runny nose, nasal congestion, etc).    - Allergy shots improve symptoms in 75-85%  - Allergy shots are the only potential permanent and disease modifying option  - We can discuss more at the next appointment if the medications are not working for you.   Atopic dermatitis: -Continue moisturizers, avoidance of fragrances and dyes, gentle skin care - Continue betamethasone topical as prescribed by dermatology - Continue to follow with dermatology  Reflux -Continue lifestyle and dietary modifications - Continue omeprazole 20 mg twice a day and famotidine twice a day - Continue to follow with gastroenterology  Oral candidiasis - We will continue to try to minimize inhaled and nasal corticosteroids -Continue nystatin swish and swallow as needed  Follow up: for faesenra injection, we will reach out to you to schedule and with  me in 3 months for routine follow up.   Thank you so much for letting me partake in your care today.  Don't hesitate to reach out if you have any additional concerns!  Roney Marion, MD  [facility] [place] [place]   Return in about 3 months (around 12/22/2021).  Meds ordered this encounter  Medications   albuterol (VENTOLIN HFA) 108 (90 Base) MCG/ACT inhaler    Sig: Two puffs every 4 hours if needed for wheezing or coughing .  May use 2 puffs 5-15 minutes prior to exercise.    Dispense:  18 g    Refill:  1   fluticasone-salmeterol (ADVAIR HFA) 115-21 MCG/ACT inhaler    Sig: Inhale 1 puff into the lungs 2 (two) times daily.    Dispense:  12 g    Refill:  3   montelukast (SINGULAIR) 10 MG tablet    Sig: Take 1 tablet (10 mg total) by mouth at bedtime.    Dispense:  90 tablet    Refill:  3   triamcinolone ointment (KENALOG) 0.1 %    Sig: Apply 1 application topically 2 (two) times daily. Apply twice daily to red itchy areas below the face.    Dispense:  45 g    Refill:  3    Lab Orders  No laboratory test(s) ordered today   Diagnostics: Spirometry:  Tracings reviewed. Her effort: Good reproducible efforts. FVC: 2.4L FEV1: 2.05L, 81% predicted FEV1/FVC ratio: 85% Interpretation: Spirometry consistent with  normal pattern.  Please see scanned spirometry results for details.  Results interpreted by myself during this encounter and discussed with patient/family.   Medication List:  Current Outpatient Medications  Medication Sig Dispense Refill   acetaminophen (TYLENOL) 500 MG tablet Take 500 mg by mouth every 6 (six) hours as needed for mild pain, moderate pain or headache.      augmented betamethasone dipropionate (DIPROLENE-AF) 0.05 % cream Apply topically.     azelastine (ASTELIN) 0.1 % nasal spray Place 1-2 sprays into both nostrils 2 (two) times daily as needed. 30 mL 5   famotidine (PEPCID) 20 MG tablet Take 1 tablet (20 mg total) by mouth 2 (two)  times daily. 180 tablet 1   levothyroxine (SYNTHROID) 88 MCG tablet TAKE ONE TABLET BY MOUTH DAILY 90 tablet 1   metFORMIN (GLUCOPHAGE) 500 MG tablet TAKE ONE TABLET BY MOUTH TWICE A DAY WITH MEALS 180 tablet 2   nystatin (MYCOSTATIN) 100000 UNIT/ML suspension Take 5 ml 4 times a day for 7 days. Retain in mouth as long as possible 150 mL 1   omeprazole (PRILOSEC) 20 MG capsule TAKE ONE CAPSULE BY MOUTH TWICE A DAY BEFORE MEALS 180 capsule 1   Probiotic Product (PROBIOTIC DAILY PO) Take 1 mg by mouth daily.     rosuvastatin (CRESTOR) 5 MG tablet TAKE ONE TABLET BY MOUTH DAILY 90 tablet 3   Spacer/Aero-Holding Chambers (AEROCHAMBER MV) inhaler Use as instructed 1 each 2   albuterol (VENTOLIN HFA) 108 (90 Base) MCG/ACT inhaler Two puffs every 4 hours if needed for wheezing or coughing .  May use 2 puffs 5-15 minutes prior to exercise. 18 g 1   fluticasone-salmeterol (ADVAIR HFA) 115-21 MCG/ACT inhaler Inhale 1 puff into the lungs 2 (two) times daily. 12 g 3   montelukast (SINGULAIR) 10 MG tablet Take 1 tablet (10 mg total) by mouth at bedtime. 90 tablet 3   triamcinolone ointment (KENALOG) 0.1 % Apply 1 application topically 2 (two) times daily. Apply twice daily to red itchy areas below the face. 45 g 3   No current facility-administered medications for this visit.   Allergies: Allergies  Allergen Reactions   Aspirin Other (See Comments)    Pt has a hx of TTP.    Nsaids Other (See Comments)    Pt has a hx of TTP.    I reviewed her past medical history, social history, family history, and environmental history and no significant changes have been reported from her previous visit.  ROS: All others negative except as noted per HPI.   Objective: BP 132/78    Pulse (!) 101    Temp 97.9 F (36.6 C) (Temporal)    Resp 17    Ht 5\' 7"  (1.702 m)    Wt 254 lb 9.6 oz (115.5 kg)    SpO2 97%    BMI 39.88 kg/m  Body mass index is 39.88 kg/m. General Appearance:  Alert, cooperative, no distress,  appears stated age  Head:  Normocephalic, without obvious abnormality, atraumatic  Eyes:  Conjunctiva clear, EOM's intact  Nose: Nares normal,  pale edematous nasal mucosa, hypertrophic turbinates, no visible anterior polyps, and septum midline  Throat: Lips, tongue normal; teeth and gums normal,  white plaque on palate and posterior oropharynx and + cobblestoning  Neck: Supple, symmetrical  Lungs:   clear to auscultation bilaterally, Respirations unlabored, no coughing  Heart:  regular rate and rhythm and no murmur, Appears well perfused  Extremities: No edema  Skin: Skin color, texture,  turgor normal, erythematous patch on back with flaking and in intragluteal cleft, no other lesions on visualized portions of skin  Neurologic: No gross deficits   Previous notes and tests were reviewed. The plan was reviewed with the patient/family, and all questions/concerned were addressed.  It was my pleasure to see Tara [name] today and participate in her care. Please feel free to contact me with any questions or concerns.  Sincerely,  Roney Marion, MD  Allergy & Immunology  [facility]

## 2021-10-10 DIAGNOSIS — J4550 Severe persistent asthma, uncomplicated: Secondary | ICD-10-CM

## 2021-10-13 ENCOUNTER — Ambulatory Visit (INDEPENDENT_AMBULATORY_CARE_PROVIDER_SITE_OTHER): Payer: Medicare Other | Admitting: *Deleted

## 2021-10-13 DIAGNOSIS — J4550 Severe persistent asthma, uncomplicated: Secondary | ICD-10-CM | POA: Diagnosis not present

## 2021-10-13 MED ORDER — BENRALIZUMAB 30 MG/ML ~~LOC~~ SOSY
30.0000 mg | PREFILLED_SYRINGE | Freq: Once | SUBCUTANEOUS | Status: AC
  Administered 2021-10-13: 30 mg via SUBCUTANEOUS

## 2021-10-20 ENCOUNTER — Ambulatory Visit (INDEPENDENT_AMBULATORY_CARE_PROVIDER_SITE_OTHER): Payer: Medicare Other | Admitting: Gastroenterology

## 2021-10-20 DIAGNOSIS — Z23 Encounter for immunization: Secondary | ICD-10-CM | POA: Diagnosis not present

## 2021-10-20 DIAGNOSIS — K7460 Unspecified cirrhosis of liver: Secondary | ICD-10-CM

## 2021-10-27 ENCOUNTER — Encounter: Payer: Self-pay | Admitting: Gastroenterology

## 2021-10-28 ENCOUNTER — Encounter: Payer: Self-pay | Admitting: Gastroenterology

## 2021-10-29 ENCOUNTER — Other Ambulatory Visit: Payer: Self-pay

## 2021-10-29 ENCOUNTER — Ambulatory Visit (HOSPITAL_COMMUNITY)
Admission: RE | Admit: 2021-10-29 | Discharge: 2021-10-29 | Disposition: A | Discharge status: Home / Self Care | Payer: Medicare Other | Source: Ambulatory Visit | Attending: Gastroenterology | Admitting: Gastroenterology

## 2021-10-29 DIAGNOSIS — K7460 Unspecified cirrhosis of liver: Secondary | ICD-10-CM | POA: Insufficient documentation

## 2021-10-29 DIAGNOSIS — R932 Abnormal findings on diagnostic imaging of liver and biliary tract: Secondary | ICD-10-CM | POA: Insufficient documentation

## 2021-10-29 MED ORDER — GADOBUTROL 1 MMOL/ML IV SOLN
10.0000 mL | Freq: Once | INTRAVENOUS | Status: AC | PRN
  Administered 2021-10-29: 10 mL via INTRAVENOUS

## 2021-10-30 ENCOUNTER — Encounter: Payer: Self-pay | Admitting: Gastroenterology

## 2021-10-31 ENCOUNTER — Encounter: Payer: Self-pay | Admitting: Gastroenterology

## 2021-11-07 DIAGNOSIS — J4550 Severe persistent asthma, uncomplicated: Secondary | ICD-10-CM

## 2021-11-10 ENCOUNTER — Other Ambulatory Visit: Payer: Self-pay

## 2021-11-10 ENCOUNTER — Encounter: Payer: Self-pay | Admitting: Internal Medicine

## 2021-11-10 ENCOUNTER — Ambulatory Visit (INDEPENDENT_AMBULATORY_CARE_PROVIDER_SITE_OTHER): Payer: Medicare Other | Admitting: *Deleted

## 2021-11-10 DIAGNOSIS — J4550 Severe persistent asthma, uncomplicated: Secondary | ICD-10-CM | POA: Diagnosis not present

## 2021-11-10 MED ORDER — BENRALIZUMAB 30 MG/ML ~~LOC~~ SOSY
30.0000 mg | PREFILLED_SYRINGE | SUBCUTANEOUS | Status: DC
  Administered 2021-11-10 – 2022-07-20 (×6): 30 mg via SUBCUTANEOUS

## 2021-11-10 NOTE — Telephone Encounter (Signed)
For now I do not want to make any changes. Keep Tara [name] follow up appointment in May with Dr. Edison Pace

## 2021-11-10 NOTE — Telephone Encounter (Signed)
How is her breathing doing? Any cough, wheeze, tightness in chest, or shortness of breath? How often is she needing to use her albuterol inhaler?

## 2021-11-26 ENCOUNTER — Telehealth: Payer: Self-pay

## 2021-11-27 NOTE — Telephone Encounter (Signed)
Patient called yesterday 11/26/2021 to let us know that she paid 80.00 for advair hfa115 mcg this time and last month paid 40.00 but patient thinks it's because she hadn't met her deductible, I did look up the drug formulary and the advair diskus was a tier 2. Patient said she will call us if her advair hfa 115 mcg is 80.00 again. I did patient that Dr. Edison Pace will not be back in the office until Monday. Patient said that was fine. ?

## 2021-12-03 ENCOUNTER — Ambulatory Visit (AMBULATORY_SURGERY_CENTER): Payer: Medicare Other | Admitting: *Deleted

## 2021-12-03 VITALS — Ht 66.0 in | Wt 251.0 lb

## 2021-12-03 DIAGNOSIS — K7400 Hepatic fibrosis, unspecified: Secondary | ICD-10-CM

## 2021-12-03 NOTE — Progress Notes (Signed)
No egg or soy allergy known to patient  ?No issues known to pt with past sedation with any surgeries or procedures ?Patient denies ever being told they had issues or difficulty with intubation  ?No FH of Malignant Hyperthermia ?Pt is not on diet pills ?Pt is not on  home 02  ?Pt is not on blood thinners  ?Pt denies issues with constipation  ?No A fib or A flutter ? ? ?Due to the COVID-19 pandemic we are asking patients to follow certain guidelines in PV and the [facility]   ?Pt aware of COVID protocols and [facility] guidelines  ? ?PV completed over the phone. Pt verified name, DOB, address and insurance during PV today.  ? ?Pt encouraged to call with questions or issues.  ?If pt has My chart, procedure instructions sent via My Chart   ?

## 2021-12-04 DIAGNOSIS — J4550 Severe persistent asthma, uncomplicated: Secondary | ICD-10-CM

## 2021-12-08 ENCOUNTER — Ambulatory Visit (INDEPENDENT_AMBULATORY_CARE_PROVIDER_SITE_OTHER): Payer: Medicare Other

## 2021-12-08 DIAGNOSIS — J4550 Severe persistent asthma, uncomplicated: Secondary | ICD-10-CM

## 2021-12-13 ENCOUNTER — Other Ambulatory Visit: Payer: Self-pay | Admitting: Internal Medicine

## 2021-12-24 ENCOUNTER — Ambulatory Visit (AMBULATORY_SURGERY_CENTER): Payer: Medicare Other | Admitting: Gastroenterology

## 2021-12-24 ENCOUNTER — Encounter: Payer: Self-pay | Admitting: Gastroenterology

## 2021-12-24 VITALS — BP 138/94 | HR 96 | Temp 96.6°F | Resp 19 | Ht 67.0 in | Wt 251.0 lb

## 2021-12-24 DIAGNOSIS — K7400 Hepatic fibrosis, unspecified: Secondary | ICD-10-CM

## 2021-12-24 DIAGNOSIS — K317 Polyp of stomach and duodenum: Secondary | ICD-10-CM | POA: Diagnosis not present

## 2021-12-24 DIAGNOSIS — K449 Diaphragmatic hernia without obstruction or gangrene: Secondary | ICD-10-CM | POA: Diagnosis not present

## 2021-12-24 MED ORDER — SODIUM CHLORIDE 0.9 % IV SOLN: 500.0000 mL | Freq: Once | INTRAVENOUS | Status: DC

## 2021-12-24 NOTE — Progress Notes (Signed)
? ?Referring Provider: [name], MD ?Primary Care Physician:  [name], MD ? ?Indication for Procedure: Screening for esophageal varices ? ? ?IMPRESSION:  ?Advanced fibrosis due to history of HCV +/- steatosis ?   - Elastography 04/28/2017: median velocity of 3.32 m/sec, ?        - corresponding to a fibrosis score of F3 to F4. ?   - has had the seasonal flu, Pneumovax, Twinrix, and Covid vaccines ?   - no history of decompensation, no varices on EGD 12/21 ?History of genotype 1a HCV with VL 1,370,000 ?   - incomplete treatment with interferon due to associated TTP ?   - achieved SVR after 12 weeks of Harvoni ?Appropriate candidate for monitored anesthesia care ? ?PLAN: ?EGD in the [facility] today ? ? ?HPI: Tara [name] is a 66 y.o. female presents for EGD to screen for esophageal varices.  She has a history of advanced liver fibrosis due to a history of hepatitis C and possible concurrent steatosis.  There is been no history of decompensation.  Last EGD December 2021 showed no esophageal varices or portal hypertensive gastropathy. ? ? ?Past Medical History:  ?Diagnosis Date  ? Allergy   ? Anxiety   ? Arthritis   ? Asthma   ? Back pain   ? Blood transfusion   ? Breast cancer ([facility]) 2012  ? left breast  ? Chronic edema   ? Cold hands and feet   ? Complication of anesthesia   ? difficulty waking up/dizzy/lightheaded  ? Cough   ? Diabetes mellitus without complication ([facility])   ? on Metformin-   ? Dysrhythmia   ? irregular heartbeat, takes digoxin  ? Environmental allergies   ? Eye pain   ? Fatigue   ? GERD (gastroesophageal reflux disease)   ? occasionally   ? H/O varicella   ? H/O varicose veins   ? Hepatic cirrhosis ([facility])   ? Hepatitis C   ? History of chemotherapy 2013  ? left breast cancer  ? History of measles, mumps, or rubella   ? Hoarseness   ? Hx of radiation therapy 12/15/11 - 01/29/12  ? left breast  ? Hyperlipidemia   ? Hypothyroidism   ? Irregular heart beat   ? under control  ? Joint pain   ? Leg  cramps   ? Neuromuscular disorder ([facility])   ? left foot nerve damage- neuropathy   ? Osteopenia   ? Palpitations   ? Pre-diabetes   ? Radiculopathy 07/2020  ? Rheumatoid arthritis ([facility])   ? Ringing in ear   ? Shortness of breath   ? Swallowing difficulty   ? Swelling of both lower extremities   ? Thrombocytopenia, primary ([facility])   ? TTP (thrombotic thrombocytopenic purpura) ([facility])   ? ? ?Past Surgical History:  ?Procedure Laterality Date  ? BREAST LUMPECTOMY  08/03/11  ? left lumpectomy and slnbx,T1cN0,triple pos  ? CATARACT EXTRACTION, BILATERAL    ? COLONOSCOPY  01/26/2019  ? COLONOSCOPY  08/12/2020  ? elbow pins Left   ? FOOT SURGERY Left   ? multiple  ? POLYPECTOMY    ? PORTACATH PLACEMENT  08/03/2011  ? Procedure: INSERTION PORT-A-CATH;  Surgeon: Merrie Roof, MD;  Location: [facility];  Service: General;  Laterality: Right;  ? portacath removal    ? TOTAL HIP ARTHROPLASTY Bilateral   ? UPPER GASTROINTESTINAL ENDOSCOPY    ? UPPER GASTROINTESTINAL ENDOSCOPY  08/12/2020  ? ? ?Current Outpatient Medications  ?  Medication Sig Dispense Refill  ? albuterol (VENTOLIN HFA) 108 (90 Base) MCG/ACT inhaler Two puffs every 4 hours if needed for wheezing or coughing .  May use 2 puffs 5-15 minutes prior to exercise. 18 g 1  ? augmented betamethasone dipropionate (DIPROLENE-AF) 0.05 % cream Apply topically.    ? azelastine (ASTELIN) 0.1 % nasal spray Place 1-2 sprays into both nostrils 2 (two) times daily as needed. 30 mL 5  ? fluticasone-salmeterol (ADVAIR HFA) 115-21 MCG/ACT inhaler Inhale 1 puff into the lungs 2 (two) times daily. 12 g 3  ? levothyroxine (SYNTHROID) 88 MCG tablet TAKE ONE TABLET BY MOUTH DAILY 90 tablet 1  ? metFORMIN (GLUCOPHAGE) 500 MG tablet TAKE ONE TABLET BY MOUTH TWICE A DAY WITH MEALS 180 tablet 2  ? montelukast (SINGULAIR) 10 MG tablet Take 1 tablet (10 mg total) by mouth at bedtime. 90 tablet 3  ? nystatin (MYCOSTATIN) 100000 UNIT/ML suspension Take 5 ml 4 times a day for 7 days. Retain in mouth as long as  possible 150 mL 1  ? omeprazole (PRILOSEC) 20 MG capsule TAKE ONE CAPSULE BY MOUTH TWICE A DAY BEFORE MEALS 180 capsule 1  ? OSTEO BI-FLEX REGULAR STRENGTH 250-200 MG TABS SMARTSIG:1 By Mouth    ? Probiotic Product (PROBIOTIC DAILY PO) Take 1 mg by mouth daily.    ? rosuvastatin (CRESTOR) 5 MG tablet TAKE ONE TABLET BY MOUTH DAILY 90 tablet 3  ? Spacer/Aero-Holding Chambers (AEROCHAMBER MV) inhaler Use as instructed 1 each 2  ? triamcinolone ointment (KENALOG) 0.1 % Apply 1 application topically 2 (two) times daily. Apply twice daily to red itchy areas below the face. 45 g 3  ? acetaminophen (TYLENOL) 500 MG tablet Take 500 mg by mouth every 6 (six) hours as needed for mild pain, moderate pain or headache.     ? famotidine (PEPCID) 20 MG tablet Take 1 tablet (20 mg total) by mouth 2 (two) times daily. 180 tablet 1  ? ?Current Facility-Administered Medications  ?Medication Dose Route Frequency Provider Last Rate Last Admin  ? 0.9 %  sodium chloride infusion  500 mL Intravenous Once Thornton Park, MD      ? Benralizumab SOSY 30 mg  30 mg Subcutaneous Q28 days Sigurd Sos, MD   30 mg at 12/08/21 1058  ? ? ?Allergies as of 12/24/2021 - Review Complete 12/24/2021  ?Allergen Reaction Noted  ? Aspirin Other (See Comments) 07/08/2011  ? Nsaids Other (See Comments) 07/21/2011  ? ? ?Family History  ?Problem Relation Age of Onset  ? Pneumonia Mother   ? COPD Mother   ? Colon polyps Mother   ? Heart disease Mother   ? Thyroid disease Mother   ? Obesity Mother   ? COPD Father   ? Obesity Father   ? Brain cancer Maternal Grandfather   ? Alcohol abuse Maternal Grandfather   ? Hyperlipidemia Neg Hx   ? Hypertension Neg Hx   ? Colon cancer Neg Hx   ? Rectal cancer Neg Hx   ? Stomach cancer Neg Hx   ? Esophageal cancer Neg Hx   ? ? ? ?Physical Exam: ?General:   Alert,  well-nourished, pleasant and cooperative in NAD ?Head:  Normocephalic and atraumatic. ?Eyes:  Sclera clear, no icterus.   Conjunctiva pink. ?Mouth:  No deformity  or lesions.   ?Neck:  Supple; no masses or thyromegaly. ?Lungs:  Clear throughout to auscultation.   No wheezes. ?Heart:  Regular rate and rhythm; no murmurs. ?Abdomen:  Soft, non-tender,  nondistended, normal bowel sounds, no rebound or guarding.  ?Msk:  Symmetrical. No boney deformities ?LAD: No inguinal or umbilical LAD ?Extremities:  No clubbing or edema. ?Neurologic:  Alert and  oriented x4;  grossly nonfocal ?Skin:  No obvious rash or bruise. ?Psych:  Alert and cooperative. Normal mood and affect. ? ? ? ? ?Studies/Results: ?No results found. ? ? ? ?Javaria Knapke L. Tarri Glenn, MD, MPH ?12/24/2021, 9:58 AM ? ? ? ?  ?

## 2021-12-24 NOTE — Patient Instructions (Signed)
Please read handouts provided. ?Continue present medications. ?Await pathology results. ?Repeat upper endoscopy in 2-3 years for screening, earlier with new evidence for decompensation. ? ? ?YOU HAD AN ENDOSCOPIC PROCEDURE TODAY AT [facility]:   Refer to the procedure report that was given to you for any specific questions about what was found during the examination.  If the procedure report does not answer your questions, please call your gastroenterologist to clarify.  If you requested that your care partner not be given the details of your procedure findings, then the procedure report has been included in a sealed envelope for you to review at your convenience later. ? ?YOU SHOULD EXPECT: Some feelings of bloating in the abdomen. Passage of more gas than usual.  Walking can help get rid of the air that was put into your GI tract during the procedure and reduce the bloating. If you had a lower endoscopy (such as a colonoscopy or flexible sigmoidoscopy) you may notice spotting of blood in your stool or on the toilet paper. If you underwent a bowel prep for your procedure, you may not have a normal bowel movement for a few days. ? ?Please Note:  You might notice some irritation and congestion in your nose or some drainage.  This is from the oxygen used during your procedure.  There is no need for concern and it should clear up in a day or so. ? ?SYMPTOMS TO REPORT IMMEDIATELY: ? ?Following upper endoscopy (EGD) ? Vomiting of blood or coffee ground material ? New chest pain or pain under the shoulder blades ? Painful or persistently difficult swallowing ? New shortness of breath ? Fever of 100?F or higher ? Black, tarry-looking stools ? ?For urgent or emergent issues, a gastroenterologist can be reached at any hour by calling [phone]. ?Do not use MyChart messaging for urgent concerns.  ? ? ?DIET:  We do recommend a small meal at first, but then you may proceed to your regular diet.  Drink  plenty of fluids but you should avoid alcoholic beverages for 24 hours. ? ?ACTIVITY:  You should plan to take it easy for the rest of today and you should NOT DRIVE or use heavy machinery until tomorrow (because of the sedation medicines used during the test).   ? ?FOLLOW UP: ?Our staff will call the number listed on your records 48-72 hours following your procedure to check on you and address any questions or concerns that you may have regarding the information given to you following your procedure. If we do not reach you, we will leave a message.  We will attempt to reach you two times.  During this call, we will ask if you have developed any symptoms of COVID 19. If you develop any symptoms (ie: fever, flu-like symptoms, shortness of breath, cough etc.) before then, please call [phone].  If you test positive for Covid 19 in the 2 weeks post procedure, please call and report this information to [name].   ? ?If any biopsies were taken you will be contacted by phone or by letter within the next 1-3 weeks.  Please call us at [phone] if you have not heard about the biopsies in 3 weeks.  ? ? ?SIGNATURES/CONFIDENTIALITY: ?You and/or your care partner have signed paperwork which will be entered into your electronic medical record.  These signatures attest to the fact that that the information above on your After Visit Summary has been reviewed and is understood.  Full responsibility of the  confidentiality of this discharge information lies with you and/or your care-partner.  ?

## 2021-12-24 NOTE — Progress Notes (Signed)
Pt in recovery with monitors in place, VSS. Report given to receiving RN. Bite guard was placed with pt awake to ensure comfort. No dental or soft tissue damage noted. 

## 2021-12-24 NOTE — Progress Notes (Signed)
Pt's states no medical or surgical changes since previsit or office visit. 

## 2021-12-24 NOTE — Progress Notes (Signed)
Called to room to assist during endoscopic procedure.  Patient ID and intended procedure confirmed with present staff. Received instructions for my participation in the procedure from the performing physician.  

## 2021-12-24 NOTE — Op Note (Signed)
[facility] ?Patient Name: Tara [name] ?Procedure Date: 12/24/2021 9:54 AM ?MRN: [id number] ?Endoscopist: Thornton Park MD, MD ?Age: 66 ?Referring MD:  ?Date of Birth: 1955/11/04 ?Gender: Female ?Account #: [id number] ?Procedure:                Upper GI endoscopy ?Indications:              Cirrhosis rule out esophageal varices ?Medicines:                Monitored Anesthesia Care ?Procedure:                Pre-Anesthesia Assessment: ?                          - Prior to the procedure, a History and Physical  ?                          was performed, and patient medications and  ?                          allergies were reviewed. The patient's tolerance of  ?                          previous anesthesia was also reviewed. The risks  ?                          and benefits of the procedure and the sedation  ?                          options and risks were discussed with the patient.  ?                          All questions were answered, and informed consent  ?                          was obtained. Prior Anticoagulants: The patient has  ?                          taken no previous anticoagulant or antiplatelet  ?                          agents. ASA Grade Assessment: III - A patient with  ?                          severe systemic disease. After reviewing the risks  ?                          and benefits, the patient was deemed in  ?                          satisfactory condition to undergo the procedure. ?                          After obtaining informed consent, the endoscope was  ?  passed under direct vision. Throughout the  ?                          procedure, the patient's blood pressure, pulse, and  ?                          oxygen saturations were monitored continuously. The  ?                          Endoscope was introduced through the mouth, and  ?                          advanced to the third part of duodenum. The upper  ?                          GI endoscopy  was accomplished without difficulty.  ?                          The patient tolerated the procedure well. ?Scope In: ?Scope Out: ?Findings:                 The examined esophagus was normal. The veins in the  ?                          mid and distal esophagus are prominent but there  ?                          are no varices. ?                          Two small sessile polyps with no bleeding and no  ?                          stigmata of recent bleeding were found in the  ?                          gastric body. Biopsies were taken with a cold  ?                          forceps for histology. Estimated blood loss was  ?                          minimal. ?                          A hiatal hernia was present. There is no portal  ?                          hypertensive gastropathy or gastric varices. ?                          The examined duodenum was normal. ?Complications:            No immediate complications. ?Estimated Blood Loss:     Estimated blood loss was minimal. ?Impression:               -  Normal esophagus. ?                          - Two gastric polyps. Biopsied. ?                          - Normal examined duodenum. ?Recommendation:           - Patient has a contact number available for  ?                          emergencies. The signs and symptoms of potential  ?                          delayed complications were discussed with the  ?                          patient. Return to normal activities tomorrow.  ?                          Written discharge instructions were provided to the  ?                          patient. ?                          - Resume previous diet. ?                          - Continue present medications. ?                          - Await pathology results. ?                          - Repeat upper endoscopy in 2-3 years for  ?                          surveillance, earlier with new evidence for  ?                          decompensation. ?Thornton Park MD,  MD ?12/24/2021 11:48:10 AM ?This report has been signed electronically. ?

## 2021-12-26 ENCOUNTER — Telehealth: Payer: Self-pay

## 2021-12-26 NOTE — Telephone Encounter (Signed)
?  Follow up Call- ? ? ?  12/24/2021  ?  9:27 AM 08/12/2020  ?  7:45 AM  ?Call back number  ?Post procedure Call Back phone  # [phone]  ?Permission to leave phone message Yes Yes  ?  ? ?Patient questions: ? ?Do you have a fever, pain , or abdominal swelling? No. ?Pain Score  0 * ? ?Have you tolerated food without any problems? Yes.   ? ?Have you been able to return to your normal activities? Yes.   ? ?Do you have any questions about your discharge instructions: ?Diet   No. ?Medications  No. ?Follow up visit  No. ? ?Do you have questions or concerns about your Care? No. ? ?Actions: ?* If pain score is 4 or above: ?No action needed, pain <4. ? ? ?

## 2021-12-29 ENCOUNTER — Ambulatory Visit: Payer: Medicare Other | Admitting: Internal Medicine

## 2021-12-30 ENCOUNTER — Encounter: Payer: Self-pay | Admitting: Internal Medicine

## 2021-12-30 ENCOUNTER — Ambulatory Visit (INDEPENDENT_AMBULATORY_CARE_PROVIDER_SITE_OTHER): Payer: Medicare Other | Admitting: Internal Medicine

## 2021-12-30 VITALS — BP 124/82 | HR 79 | Temp 97.2°F | Resp 18

## 2021-12-30 DIAGNOSIS — K219 Gastro-esophageal reflux disease without esophagitis: Secondary | ICD-10-CM

## 2021-12-30 DIAGNOSIS — J3089 Other allergic rhinitis: Secondary | ICD-10-CM

## 2021-12-30 DIAGNOSIS — J4540 Moderate persistent asthma, uncomplicated: Secondary | ICD-10-CM

## 2021-12-30 DIAGNOSIS — B370 Candidal stomatitis: Secondary | ICD-10-CM

## 2021-12-30 DIAGNOSIS — L2084 Intrinsic (allergic) eczema: Secondary | ICD-10-CM | POA: Diagnosis not present

## 2021-12-30 MED ORDER — NYSTATIN 100000 UNIT/ML MT SUSP: OROMUCOSAL | 1 refills | Status: DC

## 2021-12-30 MED ORDER — FLUTICASONE-SALMETEROL 45-21 MCG/ACT IN AERO: 1.0000 | INHALATION_SPRAY | Freq: Two times a day (BID) | RESPIRATORY_TRACT | 12 refills | Status: DC

## 2021-12-30 NOTE — Patient Instructions (Addendum)
Moderate persistent asthma:  well controlled  ?-Asthma significantly improved since starting Fasenra.  Due to persistent thrush we will stepdown Advair today to 45 mcg 1 puff twice daily.  If well controlled we can plan to step down further in 3 months. ?-We will get spirometry at next visit to guide stepdown plan  ?  ?PLAN:  ?-  Biologic:  Continue Faesenra '30mg'$  injections every 8 weeks (dosing changed today)  ?- Daily controller medication(s): Singulair '10mg'$  daily and Advair 19mg 1 puff twice day ( THIS IS YOUR NEW INHALER WITH LOWER STEROID DOSE, REPLACED OLD ADVAIR INHALER)  ?- Prior to physical activity: albuterol 2 puffs 10-15 minutes before physical activity. ?- Rescue medications: albuterol 4 puffs every 4-6 hours as needed ?- Get Influenza Vaccine and appropriate Pneumonia and COVID 19 boosters  ?- Asthma control goals:  ?* Full participation in all desired activities (may need albuterol before activity) ?* Albuterol use two time or less a week on average (not counting use with activity) ?* Cough interfering with sleep two time or less a month ?* Oral steroids no more than once a year ?* No hospitalizations ?  ?Allergic Rhinitis  ?-Continue avoidance measures ?- Continue with:  Benadryl '25mg'$  at night (this may worsen dyr eye symptoms), Singulair (montelukast) '10mg'$  daily, and Astelin (azelastine) 2 sprays per nostril 1-2 times daily as needed ?- Pataday eye drops 1 drop per eye twice a day as needed (stop if worsens dry eye symptoms), samples given today ?- You can use an extra dose of the antihistamine, if needed, for breakthrough symptoms.  ?- Consider nasal saline rinses 1-2 times daily to remove allergens from the nasal cavities as well as help with mucous clearance (this is especially helpful to do before the nasal sprays are given) ?  ?Atopic dermatitis: ?- Continue moisturizers, avoidance of fragrances and dyes, gentle skin care ?- Continue clobetasol topical as prescribed by dermatology ?- Continue  to follow with dermatology ?  ?Reflux ?-Continue lifestyle and dietary modifications ?- Continue omeprazole 20 mg twice a day and famotidine twice a day ?- Continue to follow with gastroenterology ?  ?Oral candidiasis - still active  ?- We will continue to try to minimize inhaled and nasal corticosteroids ?- Continue nystatin swish and swallow as needed ? ?Follow up: 3 months  ? ?Thank you so much for letting me partake in your care today.  Don't hesitate to reach out if you have any additional concerns! ? ?ERoney Marion MD  ?[facility] [place], [place] ? ?

## 2021-12-30 NOTE — Progress Notes (Signed)
? ?Follow Up Note ? ?RE: Tara [name] MRN: [id number] DOB: [birth year] ?Date of Office Visit: 12/30/2021 ? ?Referring provider: [name], MD ?Primary care provider: [name], MD ? ?Chief Complaint: Asthma ? ?History of Present Illness: ?I had the pleasure of seeing Tara [name] for a follow up visit at the [facility] [place] [facility] [place] on 12/30/2021. She is a 66 y.o. female, who is being followed for persistent asthma, allergic rhinitis, atopic dermatitis, oral candidiasis. Her previous allergy office visit was on 09/19/2021 with Dr. Edison Pace. Today is a regular follow up visit. ? ?At last visit due to recurrent oral candidiasis and need to reduce corticosteroids, Berna Bue was started and she received first injection on 10/13/2021. ? ?ASTHMA ?- Medical therapy: 10 mg daily, Advair 115 mcg 2 puffs twice a day, albuterol as needed, Fasenra 30 mg every 8 weeks ?- Rescue inhaler use: not required since starting [place]  ?- Symptoms: denies any cough, wheeze, shortness of breath  ?- Exacerbation history: 0 ABX for respiratory illness since last visit, 0 OCS, 0 ED, 0 UC visits in the past year  ?- ACT: 22 /25 ?- Adverse effects of medication: Still with recurrent thrush  ?- Previous FEV1: 2.05 L, 81% ? ?Allergic Rhinitis: current therapy: Benadryl '25mg'$  at night, Astelin 2 sprays per nostril twice a day (using sparingly), Previously on claritin but stopped due to perceived non benefit.  ?symptoms  improved - still with burning eye sensation, she does have some dry eyes  ?symptoms include:  Denies - feels like symptoms are well controlled  ?Previous allergy testing: yes ?History of reflux/heartburn: yes treated with omeprazole 20 mg daily, famotidine twice a day, followed by GI; Normal EGD 11/2021  ?Interested in Allergy Immunotherapy: no ? ? Atopic Dermatitis  ?- Current regimen: Betamethasone ?- Flares: back and intragluteal cleft  ?- Followed by dermatology, seen and felt that biopsy was not warranted and  prescribed clobetasol which has helped significantly  ? ? Oral Candiasis :  ?- Still with persistent symptoms, not painful but interfering with taste sensation  ?- On nystatin swish and swallow as needed - needs refills  ? ?Assessment and Plan: ?Tara [name] is a 66 y.o. female with: ?Moderate persistent asthma without complication ? ?Other allergic rhinitis ? ?Intrinsic atopic dermatitis ? ?Gastroesophageal reflux disease without esophagitis ? ?Oral candidiasis ?Plan: ?Patient Instructions  ?Moderate persistent asthma:  well controlled  ?-Asthma significantly improved since starting Fasenra.  Due to persistent thrush we will stepdown Advair today to 45 mcg 1 puff twice daily.  If well controlled we can plan to step down further in 3 months. ?-We will get spirometry at next visit to guide stepdown plan  ?  ?PLAN:  ?-  Biologic:  Continue Faesenra '30mg'$  injections every 8 weeks (dosing changed today)  ?- Daily controller medication(s): Singulair '10mg'$  daily and Advair 34mg 1 puff twice day ( THIS IS YOUR NEW INHALER WITH LOWER STEROID DOSE, REPLACED OLD ADVAIR INHALER)  ?- Prior to physical activity: albuterol 2 puffs 10-15 minutes before physical activity. ?- Rescue medications: albuterol 4 puffs every 4-6 hours as needed ?- Get Influenza Vaccine and appropriate Pneumonia and COVID 19 boosters  ?- Asthma control goals:  ?* Full participation in all desired activities (may need albuterol before activity) ?* Albuterol use two time or less a week on average (not counting use with activity) ?* Cough interfering with sleep two time or less a month ?* Oral steroids no more than once a year ?*  No hospitalizations ?  ?Allergic Rhinitis  ?-Continue avoidance measures ?- Continue with:  Benadryl '25mg'$  at night (this may worsen dyr eye symptoms), Singulair (montelukast) '10mg'$  daily, and Astelin (azelastine) 2 sprays per nostril 1-2 times daily as needed ?- Pataday eye drops 1 drop per eye twice a day as needed (stop if worsens dry eye  symptoms)  ?- You can use an extra dose of the antihistamine, if needed, for breakthrough symptoms.  ?- Consider nasal saline rinses 1-2 times daily to remove allergens from the nasal cavities as well as help with mucous clearance (this is especially helpful to do before the nasal sprays are given) ?  ?Atopic dermatitis: ?- Continue moisturizers, avoidance of fragrances and dyes, gentle skin care ?- Continue clobetasol topical as prescribed by dermatology ?- Continue to follow with dermatology ?  ?Reflux ?-Continue lifestyle and dietary modifications ?- Continue omeprazole 20 mg twice a day and famotidine twice a day ?- Continue to follow with gastroenterology ?  ?Oral candidiasis - still active  ?- We will continue to try to minimize inhaled and nasal corticosteroids ?- Continue nystatin swish and swallow as needed ? ?Follow up: 3 months  ? ?Thank you so much for letting me partake in your care today.  Don't hesitate to reach out if you have any additional concerns! ? ?Roney Marion, MD  ?[facility] [place], [place] ? ?Return in about 3 months (around 04/01/2022). ? ?Meds ordered this encounter  ?Medications  ? nystatin (MYCOSTATIN) 100000 UNIT/ML suspension  ?  Sig: Take 5 ml 4 times a day for 7 days. Retain in mouth as long as possible  ?  Dispense:  150 mL  ?  Refill:  1  ? fluticasone-salmeterol (ADVAIR HFA) 45-21 MCG/ACT inhaler  ?  Sig: Inhale 1 puff into the lungs 2 (two) times daily.  ?  Dispense:  1 each  ?  Refill:  12  ? ? ?Lab Orders  ?No laboratory test(s) ordered today  ? ?Diagnostics: ?None Performed  ? ?Medication List:  ?Current Outpatient Medications  ?Medication Sig Dispense Refill  ? acetaminophen (TYLENOL) 500 MG tablet Take 500 mg by mouth every 6 (six) hours as needed for mild pain, moderate pain or headache.     ? albuterol (VENTOLIN HFA) 108 (90 Base) MCG/ACT inhaler Two puffs every 4 hours if needed for wheezing or coughing .  May use 2 puffs 5-15 minutes prior to exercise.  18 g 1  ? augmented betamethasone dipropionate (DIPROLENE-AF) 0.05 % cream Apply topically.    ? azelastine (ASTELIN) 0.1 % nasal spray Place 1-2 sprays into both nostrils 2 (two) times daily as needed. 30 mL 5  ? famotidine (PEPCID) 20 MG tablet Take 1 tablet (20 mg total) by mouth 2 (two) times daily. 180 tablet 1  ? fluticasone-salmeterol (ADVAIR HFA) 45-21 MCG/ACT inhaler Inhale 1 puff into the lungs 2 (two) times daily. 1 each 12  ? levothyroxine (SYNTHROID) 88 MCG tablet TAKE ONE TABLET BY MOUTH DAILY 90 tablet 1  ? metFORMIN (GLUCOPHAGE) 500 MG tablet TAKE ONE TABLET BY MOUTH TWICE A DAY WITH MEALS 180 tablet 2  ? montelukast (SINGULAIR) 10 MG tablet Take 1 tablet (10 mg total) by mouth at bedtime. 90 tablet 3  ? omeprazole (PRILOSEC) 20 MG capsule TAKE ONE CAPSULE BY MOUTH TWICE A DAY BEFORE MEALS 180 capsule 1  ? OSTEO BI-FLEX REGULAR STRENGTH 250-200 MG TABS SMARTSIG:1 By Mouth    ? Probiotic Product (PROBIOTIC DAILY PO) Take 1  mg by mouth daily.    ? rosuvastatin (CRESTOR) 5 MG tablet TAKE ONE TABLET BY MOUTH DAILY 90 tablet 3  ? Spacer/Aero-Holding Chambers (AEROCHAMBER MV) inhaler Use as instructed 1 each 2  ? triamcinolone ointment (KENALOG) 0.1 % Apply 1 application topically 2 (two) times daily. Apply twice daily to red itchy areas below the face. 45 g 3  ? nystatin (MYCOSTATIN) 100000 UNIT/ML suspension Take 5 ml 4 times a day for 7 days. Retain in mouth as long as possible 150 mL 1  ? ?Current Facility-Administered Medications  ?Medication Dose Route Frequency Provider Last Rate Last Admin  ? Benralizumab SOSY 30 mg  30 mg Subcutaneous Q28 days Sigurd Sos, MD   30 mg at 12/08/21 1058  ? ?Allergies: ?Allergies  ?Allergen Reactions  ? Aspirin Other (See Comments)  ?  Pt has a hx of TTP.   ? Nsaids Other (See Comments)  ?  Pt has a hx of TTP.   ? ?I reviewed her past medical history, social history, family history, and environmental history and no significant changes have been reported from her  previous visit. ? ?ROS: All others negative except as noted per HPI.  ? ?Objective: ?BP 124/82   Pulse 79   Temp (!) 97.2 ?F (36.2 ?C) (Temporal)   Resp 18   SpO2 96%  ?There is no height or weight on file to calcu

## 2022-01-06 ENCOUNTER — Encounter: Payer: Self-pay | Admitting: Gastroenterology

## 2022-01-13 ENCOUNTER — Other Ambulatory Visit: Payer: Self-pay

## 2022-01-13 DIAGNOSIS — E2839 Other primary ovarian failure: Secondary | ICD-10-CM

## 2022-01-13 DIAGNOSIS — Z780 Asymptomatic menopausal state: Secondary | ICD-10-CM

## 2022-01-19 ENCOUNTER — Other Ambulatory Visit: Payer: Medicare Other

## 2022-01-19 DIAGNOSIS — E1169 Type 2 diabetes mellitus with other specified complication: Secondary | ICD-10-CM

## 2022-01-19 DIAGNOSIS — F32A Depression, unspecified: Secondary | ICD-10-CM

## 2022-01-19 DIAGNOSIS — E559 Vitamin D deficiency, unspecified: Secondary | ICD-10-CM

## 2022-01-19 DIAGNOSIS — E039 Hypothyroidism, unspecified: Secondary | ICD-10-CM

## 2022-01-20 LAB — CBC WITH DIFFERENTIAL/PLATELET
Absolute Monocytes: 475 cells/uL (ref 200–950)
Basophils Absolute: 29 cells/uL (ref 0–200)
Basophils Relative: 0.7 %
Eosinophils Absolute: 0 cells/uL — ABNORMAL LOW (ref 15–500)
Eosinophils Relative: 0 %
HCT: 38.7 % (ref 35.0–45.0)
Hemoglobin: 12.9 g/dL (ref 11.7–15.5)
Lymphs Abs: 685 cells/uL — ABNORMAL LOW (ref 850–3900)
MCH: 30.2 pg (ref 27.0–33.0)
MCHC: 33.3 g/dL (ref 32.0–36.0)
MCV: 90.6 fL (ref 80.0–100.0)
MPV: 12.2 fL (ref 7.5–12.5)
Monocytes Relative: 11.3 %
Neutro Abs: 3011 cells/uL (ref 1500–7800)
Neutrophils Relative %: 71.7 %
Platelets: 144 10*3/uL (ref 140–400)
RBC: 4.27 10*6/uL (ref 3.80–5.10)
RDW: 13.4 % (ref 11.0–15.0)
Total Lymphocyte: 16.3 %
WBC: 4.2 10*3/uL (ref 3.8–10.8)

## 2022-01-20 LAB — LIPID PANEL
Cholesterol: 175 mg/dL (ref <200)
HDL: 73 mg/dL (ref >=50)
LDL Cholesterol (Calc): 79 mg/dL (calc)
Non-HDL Cholesterol (Calc): 102 mg/dL (calc) (ref <130)
Total CHOL/HDL Ratio: 2.4 (calc) (ref <5.0)
Triglycerides: 133 mg/dL (ref <150)

## 2022-01-20 LAB — COMPLETE METABOLIC PANEL WITH GFR
AG Ratio: 1.4 (calc) (ref 1.0–2.5)
ALT: 17 U/L (ref 6–29)
AST: 14 U/L (ref 10–35)
Albumin: 4.3 g/dL (ref 3.6–5.1)
Alkaline phosphatase (APISO): 46 U/L (ref 37–153)
BUN: 14 mg/dL (ref 7–25)
CO2: 30 mmol/L (ref 20–32)
Calcium: 8.8 mg/dL (ref 8.6–10.4)
Chloride: 100 mmol/L (ref 98–110)
Creat: 0.9 mg/dL (ref 0.50–1.05)
Globulin: 3 g/dL (calc) (ref 1.9–3.7)
Glucose, Bld: 99 mg/dL (ref 65–99)
Potassium: 4.5 mmol/L (ref 3.5–5.3)
Sodium: 138 mmol/L (ref 135–146)
Total Bilirubin: 1.1 mg/dL (ref 0.2–1.2)
Total Protein: 7.3 g/dL (ref 6.1–8.1)
eGFR: 70 mL/min/{1.73_m2} (ref >=60)

## 2022-01-20 LAB — MICROALBUMIN, URINE: Microalb, Ur: 1.5 mg/dL

## 2022-01-20 LAB — VITAMIN D 25 HYDROXY (VIT D DEFICIENCY, FRACTURES): Vit D, 25-Hydroxy: 43 ng/mL (ref 30–100)

## 2022-01-20 LAB — HEMOGLOBIN A1C
Hgb A1c MFr Bld: 5.6 % of total Hgb (ref <5.7)
Mean Plasma Glucose: 114 mg/dL
eAG (mmol/L): 6.3 mmol/L

## 2022-01-20 LAB — TSH: TSH: 1.95 mIU/L (ref 0.40–4.50)

## 2022-01-27 ENCOUNTER — Encounter: Payer: Self-pay | Admitting: Internal Medicine

## 2022-01-27 ENCOUNTER — Ambulatory Visit (INDEPENDENT_AMBULATORY_CARE_PROVIDER_SITE_OTHER): Payer: Medicare Other | Admitting: Internal Medicine

## 2022-01-27 VITALS — BP 128/82 | HR 65 | Temp 97.1°F | Ht 66.1 in | Wt 256.0 lb

## 2022-01-27 DIAGNOSIS — Z0000 Encounter for general adult medical examination without abnormal findings: Secondary | ICD-10-CM

## 2022-01-27 DIAGNOSIS — Z6841 Body Mass Index (BMI) 40.0 and over, adult: Secondary | ICD-10-CM

## 2022-01-27 DIAGNOSIS — E559 Vitamin D deficiency, unspecified: Secondary | ICD-10-CM

## 2022-01-27 DIAGNOSIS — Z853 Personal history of malignant neoplasm of breast: Secondary | ICD-10-CM

## 2022-01-27 DIAGNOSIS — F32A Depression, unspecified: Secondary | ICD-10-CM

## 2022-01-27 DIAGNOSIS — F411 Generalized anxiety disorder: Secondary | ICD-10-CM

## 2022-01-27 DIAGNOSIS — E1169 Type 2 diabetes mellitus with other specified complication: Secondary | ICD-10-CM

## 2022-01-27 DIAGNOSIS — Z96643 Presence of artificial hip joint, bilateral: Secondary | ICD-10-CM

## 2022-01-27 DIAGNOSIS — J4520 Mild intermittent asthma, uncomplicated: Secondary | ICD-10-CM

## 2022-01-27 DIAGNOSIS — E782 Mixed hyperlipidemia: Secondary | ICD-10-CM

## 2022-01-27 DIAGNOSIS — E039 Hypothyroidism, unspecified: Secondary | ICD-10-CM

## 2022-01-27 DIAGNOSIS — Z8619 Personal history of other infectious and parasitic diseases: Secondary | ICD-10-CM

## 2022-01-27 DIAGNOSIS — F419 Anxiety disorder, unspecified: Secondary | ICD-10-CM

## 2022-01-27 LAB — POCT URINALYSIS DIPSTICK
Bilirubin, UA: NEGATIVE
Blood, UA: NEGATIVE
Glucose, UA: NEGATIVE
Ketones, UA: NEGATIVE
Leukocytes, UA: NEGATIVE
Nitrite, UA: NEGATIVE
Protein, UA: NEGATIVE
Spec Grav, UA: <=1.005 — AB (ref 1.010–1.025)
Urobilinogen, UA: 0.2 E.U./dL
pH, UA: 5 (ref 5.0–8.0)

## 2022-01-27 MED ORDER — SCOPOLAMINE 1 MG/3DAYS TD PT72: 1.0000 | MEDICATED_PATCH | TRANSDERMAL | 12 refills | Status: DC

## 2022-01-27 MED ORDER — NYSTATIN 100000 UNIT/ML MT SUSP: OROMUCOSAL | 1 refills | Status: DC

## 2022-01-27 NOTE — Progress Notes (Unsigned)
     Annual Wellness Visit     Patient: Tara [name], Female    DOB: [birth year], 66 y.o.   MRN: [id number] Visit Date: 01/27/2022  Chief Complaint  Patient presents with   Medicare Wellness   Subjective    Tara [name] is a 66 y.o. female who presents today for her Annual Wellness Visit.  HPI   Social History   Social History Narrative   Not on file    Patient Care Team: Adely Facer, Cresenciano Lick, MD as PCP - General (Internal Medicine)  Review of Systems   Objective    Vitals: BP 128/82   Pulse 65   Temp (!) 97.1 F (36.2 C) (Tympanic)   Ht 5' 6.1" (1.679 m)   Wt 256 lb (116.1 kg)   SpO2 97%   BMI 41.19 kg/m   Physical Exam   Most recent functional status assessment:    01/27/2022   11:05 AM  In your present state of health, do you have any difficulty performing the following activities:  Hearing? 0  Vision? 0  Difficulty concentrating or making decisions? 0  Walking or climbing stairs? 1  Dressing or bathing? 0  Doing errands, shopping? 0  Preparing Food and eating ? N  Using the Toilet? N  In the past six months, have you accidently leaked urine? Y  Managing your Finances? N  Housekeeping or managing your Housekeeping? N   Most recent fall risk assessment:    01/27/2022   11:05 AM  Fall Risk   Falls in the past year? 0  Number falls in past yr: 0  Injury with Fall? 0  Follow up Falls evaluation completed    Most recent depression screenings:    01/27/2022   11:05 AM 01/23/2021   11:08 AM  PHQ 2/9 Scores  PHQ - 2 Score 0 0   Most recent cognitive screening:    01/27/2022   11:06 AM  6CIT Screen  What Year? 0 points  What month? 0 points  What time? 0 points  Count back from 20 0 points       Assessment & Plan     Annual wellness visit done today including the all of the following: Reviewed patient's Family Medical History Reviewed and updated list of patient's medical providers Assessment of cognitive impairment was  done Assessed patient's functional ability Established a written schedule for health screening [place] Completed and Reviewed  Discussed health benefits of physical activity, and encouraged her to engage in regular exercise appropriate for her age and condition.    Has had one Shingrix vaccine. Had recent bone density at [facility] which is stable. Tdap is up to date.  Had recent mammogram at [facility] and was told it was Barrington Ellison, [name], MD, have reviewed all documentation for this visit. The documentation on 01/27/22 for the exam, diagnosis, procedures, and orders are all accurate and complete.   Angus Seller, CMA

## 2022-01-28 NOTE — Patient Instructions (Addendum)
Labs reviewed and are stable.  Continue current medications and follow-up in 1 year or as needed.  Letter given for a walk-in tub as requested.  Scopolamine patches prescribed for upcoming trip.  Mycostatin suspension refilled for history of thrush.

## 2022-01-30 ENCOUNTER — Other Ambulatory Visit: Payer: Self-pay | Admitting: Nurse Practitioner

## 2022-01-30 DIAGNOSIS — K7400 Hepatic fibrosis, unspecified: Secondary | ICD-10-CM

## 2022-01-30 DIAGNOSIS — J4550 Severe persistent asthma, uncomplicated: Secondary | ICD-10-CM | POA: Diagnosis not present

## 2022-02-02 ENCOUNTER — Ambulatory Visit (INDEPENDENT_AMBULATORY_CARE_PROVIDER_SITE_OTHER): Payer: Medicare Other

## 2022-02-02 DIAGNOSIS — J4550 Severe persistent asthma, uncomplicated: Secondary | ICD-10-CM

## 2022-02-03 ENCOUNTER — Ambulatory Visit (INDEPENDENT_AMBULATORY_CARE_PROVIDER_SITE_OTHER): Payer: Medicare Other | Admitting: Gastroenterology

## 2022-02-03 ENCOUNTER — Encounter: Payer: Self-pay | Admitting: Gastroenterology

## 2022-02-03 VITALS — BP 124/72 | HR 82 | Ht 66.0 in | Wt 256.0 lb

## 2022-02-03 DIAGNOSIS — K7400 Hepatic fibrosis, unspecified: Secondary | ICD-10-CM

## 2022-02-03 DIAGNOSIS — K7030 Alcoholic cirrhosis of liver without ascites: Secondary | ICD-10-CM

## 2022-02-03 DIAGNOSIS — K3184 Gastroparesis: Secondary | ICD-10-CM | POA: Diagnosis not present

## 2022-02-03 NOTE — Patient Instructions (Addendum)
It was a pleasure to see you today. I am so glad that you are feeling better and not having any further episodes.  We discussed stopping your famotidine. If after one week you are still feeling well, stop your evening dose of omeprazole. If one week later you are still feeling well, reduce your morning dose of omeprazole to every other day for one week before discontinuing it all together.   You should have another ultrasound and labs at your visit in 6 months.   You will be due a colonoscopy in December 2024.

## 2022-02-03 NOTE — Progress Notes (Signed)
Referring Provider: [name], MD Primary Care Physician:  [name], MD  Chief complaint:  Cirrhosis, nausea, vomiting   IMPRESSION:  Advanced fibrosis and possible early cirrhosis due to history of HCV +/- steatosis    - Elastography 04/28/2017: median velocity of 3.32 m/sec,         - corresponding to a fibrosis score of F3 to F4.    - has had the seasonal flu, Pneumovax, Twinrix, and Covid vaccines    - no history of decompensation, no varices on EGD 12/21 History of genotype 1a HCV with VL 1,370,000    - incomplete treatment with interferon due to associated TTP    - achieved SVR after 12 weeks of Harvoni Gastroparesis presenting with intermittent nausea, vomiting, and diarrhea    - occurring monthly    - GES showed delayed gastric emptying 11/18/20    - symptoms resolved Stable portahepatis LAD Multiple stable liver cysts on prior abdominal imaging Complex kidney cyst on MRI 10/04/20 Normal, but low normal platelets History of colon polyps    - adenoma in 2007 on colonoscopy in [place]    - flexible sigmoidoscopy with [name] 2014    - large hepatic flexure TVA removed in piecemeal fashion requiring several exams    - concurrent TA, SSP, and inflammatory polyp removed in 2020 History of Schatzki's ring s/p dilation 2001  Hiatal hernia with reflux History of T1cN0 breast cancer Vitamin D deficiency  Advanced fibrosis noted on elastography prior to eradication of hepatitis C. No history of decompensation. Continue abdominal imaging every 6 months for [facility] surveillance.   Continue with EGD for esophageal varices surveillance. Maintaining a healthy weight through diet and exercise is critical in the setting of suspected fatty liver. Abstinence from alcohol recommended.  Gastroparesis: Symptoms have resolved. ? Post-infectious gastroparesis? Recommended a low residue, low fiber, low fat diet if/when symptoms recur.  She should separate solids and liquids. Drink only  sips during meals and hydrate with 4 ounces of water at a time inbetween meals.    PLAN: - Labs on next visit to include: CMP, CBC, INR, AFP to allow calculation of a MELD score - Continue omeprazole 20 mg BID and famotidine 20 mg BID - will taper off these medications as symptoms will allow (see patient instructions for more details) - Abdominal ultrasound 05/2022 for [facility] screening - Colonoscopy 08/2023 - EGD 2025 - Office follow-up in 6-12 months, earlier with new symptoms - Work to maintain a healthy weight through diet and regular exercise  I spent 40 minutes, including in depth chart review, independent review of results, communicating results with the patient directly, face-to-face time with the patient, coordinating care, ordering studies and medications as appropriate, and documentation.  HPI: Tara [name] is a 66 y.o. female returns in follow-up for advanced liver fibrosis and gastroparesis. She was last seen in the office 08/05/21 and had an EGD 12/24/21.  The interval history is obtained through the patient, her husband who accompanies her to this appointment and contributes to the history, and review of her electronic health record.  She has advanced liver fibrosis and suspected early cirrhosis due to prior HCV without history of decompensation. No symptoms at this time except for fatigue. She has gained 20 pounds due to inactivity and Covid changes. She is trying Film/video editor.   No further episodes of acute GI symptoms since July 2022.   No new complaints or concerns.   She wasn't able to go on  her a [place] cruise in April because she broke her toe a few days earlier at the [employer].    Recent labs: Labs from 01/19/2022: Normal comprehensive metabolic panel including sodium 138, creatinine 0.90, total bilirubin 1.1.  Normal CBC including hemoglobin 12.9 and platelets 144.  AFP 2.8 08/07/2021.  INR 0.9 08/07/2021.  Prior imaging studies: *Elastography  04/28/2017 median velocity of 3.32 m/sec, IQR 0.25, IQR/Median velocity ratio of 0.08 corresponding to a fibrosis score of F3 to F4. *MRI of the abdomen with and without contrast 05/21/2017 showed small scattered T2 hyperintense liver lesions that were thought to be cysts or hematomas.  Mild hepatic steatosis.  Normal spleen.  Enlarged 1.6 cm portacaval node and an enlarged 1.0 cm porta hepatis node. *CT of the abdomen and pelvis with contrast 05/19/2018 showed multiple scattered tiny hypodensities throughout the liver similar in appearance to the prior MRI.  Otherwise normal liver pancreas and spleen.  Findings of small bowel enteritis. *CT of the chest with contrast 04/13/2018 showed well-circumscribed low-density liver lesions thought to be cysts.  Porta hepatis node of 1.5 cm.  Equal node measuring 2.07c *Right upper quadrant ultrasound 05/05/2018 shows a mildly echogenic liver and multiple small liver cysts.  No mass present. *PET scan 05/16/2018 shows a mildly hypermetabolic porta hepatis lymph node measuring 1.6cm. No other lesions identified.  *Gastric emptying scan March 2021 with abnormal emptying at 4 hours *MRI 10/04/20 showed a decrease in the size of a porta hepatis lymph node, complex cystic kidney lesion, and no metastatic disease in the abdomen. Follow-up MRI recommended in 3-6 months. Multiple small liver cysts.  *MRI 10/29/2021 showed hepatomegaly, numerous small hepatic cysts, no suspicious hepatic mass  Prior endoscopic procedures: *[facility] for achalasia and underwent dilation with a 60 [place] dilator.  Dysphasia patient had a hernia Schatzki's ring *Flexible sigmoidoscopy was performed by Dr. Elicia Lamp 04/20/2013 for chronic diarrhea or indications noted a history of a tubular adenoma on colonoscopy in 2007 in [place].  At the time of diarrhea stool studies and celiac profile was negative.  The examined mucosa was normal. Random  biopsies were also normal.  * Colonoscopy 2019, 2020 and 2021 for peicemeal resection of a  65m hepatic flexure tubulovillous adenomas, small tubular adenoma, diverticulosis  * Colonoscopy 2020 * Colonoscopy 2021: Diverticulosis, 4 mm of residual polyp at the hepatic flexure with pathology showing tubular adenoma, inflammatory polyp at the descending colon, sessile serrated polyp in the cecum * EGD 08/30/20: Grade 2 [place] hiatal hernia.  No portal hypertensive gastropathy, esophageal varices, or gastric varices. *EGD 12/24/2021 showed no esophageal or gastric varices, no portal hypertensive gastropathy, a hiatal hernia, and hyperplastic gastric polyps    Past Medical History:  Diagnosis Date   Allergy    Anxiety    Arthritis    Asthma    Back pain    Blood transfusion    [place]   left breast   Chronic edema    Cold hands and feet    Complication of anesthesia    difficulty waking up/dizzy/lightheaded   Cough    Diabetes mellitus without complication (HCC)    on Metformin-    Dysrhythmia    irregular heartbeat, takes digoxin   Environmental allergies    Eye pain    Fatigue    GERD (gastroesophageal reflux disease)    occasionally    H/O varicella    H/O varicose veins  Hepatic cirrhosis ([place])    Hepatitis C    History of chemotherapy 2013   left breast cancer   History of measles, mumps, or rubella    Hoarseness    Hx of radiation therapy 12/15/11 - 01/29/12   left breast   Hyperlipidemia    Hypothyroidism    Irregular heart beat    under control   Joint pain    Leg cramps    Neuromuscular disorder (HCC)    left foot nerve damage- neuropathy    Osteopenia    Palpitations    Pre-diabetes    Radiculopathy 07/2020   Rheumatoid arthritis ([place])    Ringing in ear    Shortness of breath    Swallowing difficulty    Swelling of both lower extremities    Thrombocytopenia, primary (HCC)    TTP (thrombotic thrombocytopenic purpura) ([place])     Past  Surgical History:  Procedure Laterality Date   BREAST LUMPECTOMY  08/03/11   left lumpectomy and slnbx,T1cN0,triple pos   CATARACT EXTRACTION, BILATERAL     COLONOSCOPY  01/26/2019   COLONOSCOPY  08/12/2020   elbow pins Left    FOOT SURGERY Left    multiple   POLYPECTOMY     PORTACATH PLACEMENT  08/03/2011   Procedure: INSERTION PORT-A-CATH;  Surgeon: Luella Cook III, MD;  Location: [facility];  Service: General;  Laterality: Right;   portacath removal     TOTAL HIP ARTHROPLASTY Bilateral    UPPER GASTROINTESTINAL ENDOSCOPY     UPPER GASTROINTESTINAL ENDOSCOPY  08/12/2020     Current Outpatient Medications  Medication Sig Dispense Refill   acetaminophen (TYLENOL) 500 MG tablet Take 500 mg by mouth every 6 (six) hours as needed for mild pain, moderate pain or headache.      albuterol (VENTOLIN HFA) 108 (90 Base) MCG/ACT inhaler Two puffs every 4 hours if needed for wheezing or coughing .  May use 2 puffs 5-15 minutes prior to exercise. 18 g 1   augmented betamethasone dipropionate (DIPROLENE-AF) 0.05 % cream Apply topically.     azelastine (ASTELIN) 0.1 % nasal spray Place 1-2 sprays into both nostrils 2 (two) times daily as needed. 30 mL 5   Benralizumab 30 MG/ML SOSY Inject 30 mg into the skin every 28 (twenty-eight) days.     famotidine (PEPCID) 20 MG tablet TAKE ONE TABLET BY MOUTH TWICE A DAY 180 tablet 1   fluticasone-salmeterol (ADVAIR HFA) 115-21 MCG/ACT inhaler Inhale 1 puff into the lungs 2 (two) times daily.     levothyroxine (SYNTHROID) 88 MCG tablet TAKE ONE TABLET BY MOUTH DAILY 90 tablet 1   metFORMIN (GLUCOPHAGE) 500 MG tablet TAKE ONE TABLET BY MOUTH TWICE A DAY WITH MEALS 180 tablet 2   montelukast (SINGULAIR) 10 MG tablet Take 1 tablet (10 mg total) by mouth at bedtime. 90 tablet 3   nystatin (MYCOSTATIN) 100000 UNIT/ML suspension Take 5 ml 4 times a day for 7 days. Retain in mouth as long as possible 150 mL 1   omeprazole (PRILOSEC) 20 MG capsule TAKE ONE CAPSULE BY MOUTH  TWICE A DAY BEFORE MEALS 180 capsule 1   OSTEO BI-FLEX REGULAR STRENGTH 250-200 MG TABS SMARTSIG:1 By Mouth     Probiotic Product (PROBIOTIC DAILY PO) Take 1 mg by mouth daily.     rosuvastatin (CRESTOR) 5 MG tablet TAKE ONE TABLET BY MOUTH DAILY 90 tablet 3   scopolamine (TRANSDERM-SCOP) 1 MG/3DAYS Place 1 patch (1.5 mg total) onto the skin every 3 (three) days.  10 patch 12   Spacer/Aero-Holding Chambers (AEROCHAMBER MV) inhaler Use as instructed 1 each 2   triamcinolone ointment (KENALOG) 0.1 % Apply 1 application topically 2 (two) times daily. Apply twice daily to red itchy areas below the face. 45 g 3   Current Facility-Administered Medications  Medication Dose Route Frequency Provider Last Rate Last Admin   Benralizumab SOSY 30 mg  30 mg Subcutaneous Q28 days Sigurd Sos, MD   30 mg at 02/02/22 1114    Allergies as of 02/03/2022 - Review Complete 02/03/2022  Allergen Reaction Noted   Aspirin Other (See Comments) 07/08/2011   Nsaids Other (See Comments) 07/21/2011    Family History  Problem Relation Age of Onset   Pneumonia Mother    COPD Mother    Colon polyps Mother    Heart disease Mother    Thyroid disease Mother    Obesity Mother    COPD Father    Obesity Father    Brain cancer Maternal Grandfather    Alcohol abuse Maternal Grandfather    Hyperlipidemia Neg Hx    Hypertension Neg Hx    Colon cancer Neg Hx    Rectal cancer Neg Hx    Stomach cancer Neg Hx    Esophageal cancer Neg Hx       Physical Exam: General:   Alert, in NAD. No bilateral temporal wasting. Appears her stated age.  HEENT: No scleral icterus. Symmteric. Neck: No LAD, no thyromegaly Heart:  Regular rate and rhythm; no murmurs Pulm: Clear anteriorly; no wheezing Abdomen:  Soft. Central obesity. Nontender. Nondistended. Normal bowel sounds. No rebound or guarding. No fluid wave.  LAD: No inguinal or umbilical LAD Extremities:  Without edema. Neurologic:  Alert and  oriented x4;  grossly normal  neurologically; no asterixis or clonus. Skin: No jaundice. No palmar erythema or spider angioma. + Terry's nails. Psych:  Alert and cooperative. Normal mood and affect.  Thornton Park, MD, MPH [facility]

## 2022-02-19 ENCOUNTER — Other Ambulatory Visit: Payer: Self-pay

## 2022-02-19 ENCOUNTER — Encounter: Payer: Self-pay | Admitting: Gastroenterology

## 2022-02-19 DIAGNOSIS — K7400 Hepatic fibrosis, unspecified: Secondary | ICD-10-CM

## 2022-02-19 MED ORDER — FAMOTIDINE 20 MG PO TABS: 20.0000 mg | ORAL_TABLET | Freq: Two times a day (BID) | ORAL | 1 refills | Status: DC

## 2022-03-09 ENCOUNTER — Ambulatory Visit: Payer: Medicare Other

## 2022-03-09 ENCOUNTER — Encounter: Payer: Self-pay | Admitting: Internal Medicine

## 2022-03-15 ENCOUNTER — Encounter: Payer: Self-pay | Admitting: Gastroenterology

## 2022-03-17 ENCOUNTER — Other Ambulatory Visit: Payer: Self-pay | Admitting: *Deleted

## 2022-03-17 DIAGNOSIS — R932 Abnormal findings on diagnostic imaging of liver and biliary tract: Secondary | ICD-10-CM

## 2022-03-17 DIAGNOSIS — K7030 Alcoholic cirrhosis of liver without ascites: Secondary | ICD-10-CM

## 2022-03-17 MED ORDER — OMEPRAZOLE 20 MG PO CPDR: DELAYED_RELEASE_CAPSULE | ORAL | 1 refills | Status: DC

## 2022-03-25 ENCOUNTER — Encounter: Payer: Self-pay | Admitting: Internal Medicine

## 2022-03-26 NOTE — Telephone Encounter (Signed)
Appointment scheduled.

## 2022-03-30 ENCOUNTER — Ambulatory Visit (INDEPENDENT_AMBULATORY_CARE_PROVIDER_SITE_OTHER): Payer: Medicare Other | Admitting: Internal Medicine

## 2022-03-30 ENCOUNTER — Ambulatory Visit: Payer: Medicare Other

## 2022-03-30 ENCOUNTER — Ambulatory Visit (INDEPENDENT_AMBULATORY_CARE_PROVIDER_SITE_OTHER): Payer: Medicare Other

## 2022-03-30 ENCOUNTER — Encounter: Payer: Self-pay | Admitting: Internal Medicine

## 2022-03-30 VITALS — BP 124/76 | HR 88 | Temp 97.5°F | Resp 20

## 2022-03-30 DIAGNOSIS — J4550 Severe persistent asthma, uncomplicated: Secondary | ICD-10-CM

## 2022-03-30 DIAGNOSIS — L2084 Intrinsic (allergic) eczema: Secondary | ICD-10-CM | POA: Diagnosis not present

## 2022-03-30 DIAGNOSIS — J3089 Other allergic rhinitis: Secondary | ICD-10-CM

## 2022-03-30 DIAGNOSIS — K219 Gastro-esophageal reflux disease without esophagitis: Secondary | ICD-10-CM

## 2022-03-30 DIAGNOSIS — B370 Candidal stomatitis: Secondary | ICD-10-CM

## 2022-03-30 MED ORDER — TRIAMCINOLONE ACETONIDE 0.1 % EX OINT: 1.0000 | TOPICAL_OINTMENT | Freq: Two times a day (BID) | CUTANEOUS | 0 refills | Status: DC

## 2022-03-30 MED ORDER — CLOBETASOL PROPIONATE 0.05 % EX OINT: 1.0000 | TOPICAL_OINTMENT | Freq: Two times a day (BID) | CUTANEOUS | 0 refills | Status: DC

## 2022-03-30 NOTE — Patient Instructions (Addendum)
Moderate persistent asthma:  well controlled  -Breathing test today showed:  looked great!   PLAN:  -  Biologic:  Continue Faesenra '30mg'$  injections every 8 weeks (dosing changed today)  - Daily controller medication(s): Singulair '10mg'$  daily and Advair 57mg 1 puff tonce daily - Prior to physical activity: albuterol 2 puffs 10-15 minutes before physical activity. - Rescue medications: albuterol 4 puffs every 4-6 hours as needed - Get Influenza Vaccine and appropriate Pneumonia and COVID 19 boosters  - Asthma control goals:  * Full participation in all desired activities (may need albuterol before activity) * Albuterol use two time or less a week on average (not counting use with activity) * Cough interfering with sleep two time or less a month * Oral steroids no more than once a year * No hospitalizations   Allergic Rhinitis  -Continue avoidance measures - Continue with:  Benadryl '25mg'$  at night (this may worsen dyr eye symptoms), Singulair (montelukast) '10mg'$  daily, and Astelin (azelastine) 2 sprays per nostril 1-2 times daily as needed - Pataday eye drops 1 drop per eye twice a day as needed (stop if worsens dry eye symptoms), samples given today - You can use an extra dose of the antihistamine, if needed, for breakthrough symptoms.  - Consider nasal saline rinses 1-2 times daily to remove allergens from the nasal cavities as well as help with mucous clearance (this is especially helpful to do before the nasal sprays are given)   Atopic Dermatitis:  Daily Care For Maintenance (daily and continue even once eczema controlled) - Recommend hypoallergenic hydrating ointment at least twice daily.  This must be done daily for control of flares. (Great options include Vaseline, CeraVe, Aquaphor, Aveeno, Cetaphil, VaniCream, etc) - Recommend avoiding detergents, soaps or lotions with fragrances/dyes, and instead using products which are hypoallergenic, use second rinse cycle when washing  clothes -Wear lose breathable clothing, avoid wool -Avoid extremes of humidity - Limit showers/baths to 5 minutes and use luke warm water instead of hot, pat dry following baths, and apply moisturizer - can use steroid creams as detailed below up to twice weekly for prevention of flares.  For Flares:(add this to maintenance therapy if needed for flares) - Clobetasol 0.0.5% to body for severe flares-apply topically twice daily to red, raised, thickened areas of skin, followed by moisturizer   -Use for 5 days then step down to triamcinolone 0.1% ointment  - Triamcinolone 0.1% to body for moderate flares-apply topically twice daily to red, raised areas of skin, followed by moisturizer - Hydrocortisone 2.5% to face, armpit or groin-apply topically twice daily to red, raised areas of skin, followed by moisturizer    Reflux -Continue lifestyle and dietary modifications - Continue omeprazole 20 mg twice a day and famotidine twice a day - Continue to follow with gastroenterology   Oral candidiasis - resolving  - We will continue to try to minimize inhaled and nasal corticosteroids  Follow up: 3 months   Thank you so much for letting me partake in your care today.  Don't hesitate to reach out if you have any additional concerns!  ERoney Marion MD  [facility] [place]

## 2022-03-30 NOTE — Progress Notes (Signed)
Follow Up Note  RE: Tara [name] MRN: [id number] DOB: [birth year] Date of Office Visit: 03/30/2022  Referring provider: [name], MD Primary care provider: [name], MD  Chief Complaint: Nasal Congestion (In the mornings ) and Eczema  History of Present Illness: I had the pleasure of seeing Tara [name] for a follow up visit at the [facility] [place] on 03/30/2022. She is a 66 y.o. female, who is being followed for severe persistent asthma, atopic dermatitis, . Her previous allergy office visit was on 12/30/21 with Dr. Edison Pace. Today is a regular follow up visit.  History obtained from patient  and  chart review  .  Atopic Dermatitis:  Diagnosed at age in her 67s, previously well controlled but over the past year has had increaseing flares, flares mostly backs of legs, chest . Previous therapies tried clobetasol, triamcinalone, betemethasone  Current regimen: none - she has brought list of creams and ointments she has had use in the past, but they are expired    Reports use of fragrance/dye free products Identified triggers of flares include none Sleep un affected   ASTHMA - Medical therapy: Advair 76mg 1 puff twice daily  - Rescue inhaler use: denies use  - Symptoms: rare cough, feels like asthma has signficantly improved since starting faesenra - Exacerbation history: 0 ABX for respiratory illness since last visit, 0 OCS, 0ED, 0 UC visits in the past year  - ACT: 22 /25 - Adverse effects of medication: chills with faesenra injections, still with ongoing thrush but has improved since reducing advair dose  - Previous FEV1: 2.05 L, 81% - Biologic Labs on fasenra (started 2023)   Allergic Rhinitis: current therapy: Benadryl '25mg'$  at night, Astelin 2 sprays per nostril twice a day (using sparingly), Previously on claritin but stopped due to perceived non benefit.  symptoms  improved - still with AM nasal congestion, which responds well with sinus rinses.  Not  using sinus rinse prior to nose sprays  Previous allergy testing: yes History of reflux/heartburn: yes treated with omeprazole 20 mg daily, famotidine twice a day, followed by GI; Normal EGD 11/2021- no breakthrough symptoms  Interested in Allergy Immunotherapy: no       Assessment and Plan: MNeilaniis a 66y.o. female with: Severe persistent asthma, unspecified whether complicated - Plan: Spirometry with Graph  Other allergic rhinitis  Gastroesophageal reflux disease without esophagitis  Intrinsic atopic dermatitis  Oral candidiasis Plan: Patient Instructions  Moderate persistent asthma:  well controlled  -Breathing test today showed:  looked great!   PLAN:  -  Biologic:  Continue Faesenra '30mg'$  injections every 8 weeks (dosing changed today)  - Daily controller medication(s): Singulair '10mg'$  daily and Advair 462m 1 puff tonce daily - Prior to physical activity: albuterol 2 puffs 10-15 minutes before physical activity. - Rescue medications: albuterol 4 puffs every 4-6 hours as needed - Get Influenza Vaccine and appropriate Pneumonia and COVID 19 boosters  - Asthma control goals:  * Full participation in all desired activities (may need albuterol before activity) * Albuterol use two time or less a week on average (not counting use with activity) * Cough interfering with sleep two time or less a month * Oral steroids no more than once a year * No hospitalizations   Allergic Rhinitis  -Continue avoidance measures - Continue with:  Benadryl '25mg'$  at night (this may worsen dyr eye symptoms), Singulair (montelukast) '10mg'$  daily, and Astelin (azelastine) 2 sprays per nostril 1-2 times  daily as needed - Pataday eye drops 1 drop per eye twice a day as needed (stop if worsens dry eye symptoms), samples given today - You can use an extra dose of the antihistamine, if needed, for breakthrough symptoms.  - Consider nasal saline rinses 1-2 times daily to remove allergens from the nasal  cavities as well as help with mucous clearance (this is especially helpful to do before the nasal sprays are given)   Atopic Dermatitis:  Daily Care For Maintenance (daily and continue even once eczema controlled) - Recommend hypoallergenic hydrating ointment at least twice daily.  This must be done daily for control of flares. (Great options include Vaseline, CeraVe, Aquaphor, Aveeno, Cetaphil, VaniCream, etc) - Recommend avoiding detergents, soaps or lotions with fragrances/dyes, and instead using products which are hypoallergenic, use second rinse cycle when washing clothes -Wear lose breathable clothing, avoid wool -Avoid extremes of humidity - Limit showers/baths to 5 minutes and use luke warm water instead of hot, pat dry following baths, and apply moisturizer - can use steroid creams as detailed below up to twice weekly for prevention of flares.  For Flares:(add this to maintenance therapy if needed for flares) - Clobetasol 0.0.5% to body for severe flares-apply topically twice daily to red, raised, thickened areas of skin, followed by moisturizer   -Use for 5 days then step down to triamcinolone 0.1% ointment  - Triamcinolone 0.1% to body for moderate flares-apply topically twice daily to red, raised areas of skin, followed by moisturizer - Hydrocortisone 2.5% to face, armpit or groin-apply topically twice daily to red, raised areas of skin, followed by moisturizer    Reflux -Continue lifestyle and dietary modifications - Continue omeprazole 20 mg twice a day and famotidine twice a day - Continue to follow with gastroenterology   Oral candidiasis - resolving  - We will continue to try to minimize inhaled and nasal corticosteroids  Follow up: 3 months   Thank you so much for letting me partake in your care today.  Don't hesitate to reach out if you have any additional concerns!  Roney Marion, MD  [facility] [place] [place].  Meds ordered  this encounter  Medications   triamcinolone ointment (KENALOG) 0.1 %    Sig: Apply 1 Application topically 2 (two) times daily.    Dispense:  30 g    Refill:  0   clobetasol ointment (TEMOVATE) 0.05 %    Sig: Apply 1 Application topically 2 (two) times daily.    Dispense:  30 g    Refill:  0    Lab Orders  No laboratory test(s) ordered today   Diagnostics: Spirometry:  Tracings reviewed. Her effort: Good reproducible efforts. FVC: 2.64 L FEV1: 2.14 L, 85% predicted FEV1/FVC ratio: 81% Interpretation: Spirometry consistent with normal pattern.  Please see scanned spirometry results for details.  Results interpreted by myself during this encounter and discussed with patient/family.   Medication List:  Current Outpatient Medications  Medication Sig Dispense Refill   albuterol (VENTOLIN HFA) 108 (90 Base) MCG/ACT inhaler Two puffs every 4 hours if needed for wheezing or coughing .  May use 2 puffs 5-15 minutes prior to exercise. 18 g 1   azelastine (ASTELIN) 0.1 % nasal spray Place 1-2 sprays into both nostrils 2 (two) times daily as needed. 30 mL 5   Benralizumab 30 MG/ML SOSY Inject 30 mg into the skin every 28 (twenty-eight) days.     clobetasol ointment (TEMOVATE) 0.05 % Apply 1  Application topically 2 (two) times daily. 30 g 0   famotidine (PEPCID) 20 MG tablet Take 1 tablet (20 mg total) by mouth 2 (two) times daily. 180 tablet 1   fluticasone-salmeterol (ADVAIR HFA) 115-21 MCG/ACT inhaler Inhale 1 puff into the lungs 2 (two) times daily.     levothyroxine (SYNTHROID) 88 MCG tablet TAKE ONE TABLET BY MOUTH DAILY 90 tablet 1   metFORMIN (GLUCOPHAGE) 500 MG tablet TAKE ONE TABLET BY MOUTH TWICE A DAY WITH MEALS 180 tablet 2   montelukast (SINGULAIR) 10 MG tablet Take 1 tablet (10 mg total) by mouth at bedtime. 90 tablet 3   nystatin (MYCOSTATIN) 100000 UNIT/ML suspension Take 5 ml 4 times a day for 7 days. Retain in mouth as long as possible 150 mL 1   omeprazole (PRILOSEC) 20  MG capsule TAKE ONE CAPSULE BY MOUTH TWICE A DAY BEFORE MEALS 180 capsule 1   OSTEO BI-FLEX REGULAR STRENGTH 250-200 MG TABS SMARTSIG:1 By Mouth     Probiotic Product (PROBIOTIC DAILY PO) Take 1 mg by mouth daily.     rosuvastatin (CRESTOR) 5 MG tablet TAKE ONE TABLET BY MOUTH DAILY 90 tablet 3   scopolamine (TRANSDERM-SCOP) 1 MG/3DAYS Place 1 patch (1.5 mg total) onto the skin every 3 (three) days. 10 patch 12   Spacer/Aero-Holding Chambers (AEROCHAMBER MV) inhaler Use as instructed 1 each 2   triamcinolone ointment (KENALOG) 0.1 % Apply 1 Application topically 2 (two) times daily. 30 g 0   acetaminophen (TYLENOL) 500 MG tablet Take 500 mg by mouth every 6 (six) hours as needed for mild pain, moderate pain or headache.      Current Facility-Administered Medications  Medication Dose Route Frequency Provider Last Rate Last Admin   Benralizumab SOSY 30 mg  30 mg Subcutaneous Q28 days Clemon Chambers, MD   30 mg at 03/30/22 1100   Allergies: Allergies  Allergen Reactions   Aspirin Other (See Comments)    Pt has a hx of TTP.    Nsaids Other (See Comments)    Pt has a hx of TTP.    I reviewed her past medical history, social history, family history, and environmental history and no significant changes have been reported from her previous visit.  ROS: All others negative except as noted per HPI.   Objective: BP 124/76   Pulse 88   Temp (!) 97.5 F (36.4 C) (Temporal)   Resp 20   SpO2 97%  There is no height or weight on file to calculate BMI. General Appearance:  Alert, cooperative, no distress, appears stated age  Head:  Normocephalic, without obvious abnormality, atraumatic  Eyes:  Conjunctiva clear, EOM's intact  Nose: Nares normal, hypertrophic turbinates, no visible anterior polyps, and septum midline  Throat: Lips, tongue normal; teeth and gums normal, normal posterior oropharynx  Neck: Supple, symmetrical  Lungs:   clear to auscultation bilaterally, Respirations unlabored, no  coughing  Heart:  regular rate and rhythm and no murmur, Appears well perfused  Extremities: No edema  Skin: Skin color, texture, turgor normal, eczematous patch on back of left leg, left thigh and on chest  Neurologic: No gross deficits   Previous notes and tests were reviewed. The plan was reviewed with the patient/family, and all questions/concerned were addressed.  It was my pleasure to see Tara [name] today and participate in her care. Please feel free to contact me with any questions or concerns.  Sincerely,  Roney Marion, MD  Allergy & Immunology  [facility] [place]  [facility] Office: [phone]

## 2022-04-01 ENCOUNTER — Ambulatory Visit: Payer: Medicare Other | Admitting: Internal Medicine

## 2022-04-02 ENCOUNTER — Telehealth (INDEPENDENT_AMBULATORY_CARE_PROVIDER_SITE_OTHER): Payer: Medicare Other | Admitting: Internal Medicine

## 2022-04-02 VITALS — Temp 97.0°F | Wt 250.0 lb

## 2022-04-02 DIAGNOSIS — M5414 Radiculopathy, thoracic region: Secondary | ICD-10-CM | POA: Diagnosis not present

## 2022-04-02 DIAGNOSIS — M7918 Myalgia, other site: Secondary | ICD-10-CM

## 2022-04-02 DIAGNOSIS — Z8619 Personal history of other infectious and parasitic diseases: Secondary | ICD-10-CM

## 2022-04-02 DIAGNOSIS — Z7184 Encounter for health counseling related to travel: Secondary | ICD-10-CM

## 2022-04-02 MED ORDER — NYSTATIN 500000 UNITS PO TABS: 500000.0000 [IU] | ORAL_TABLET | Freq: Two times a day (BID) | ORAL | 0 refills | Status: DC

## 2022-04-02 NOTE — Progress Notes (Signed)
   Subjective:    Patient ID: Tara [name], female    DOB: Apr 14, 1956, 66 y.o.   MRN: [id number]  HPI 66 year old Female seen via audio and video telecommunications today.  Patient is identified using 2 identifiers as Tara [name]. [name], a patient in this practice.  She is at her home and I am at my office.  She is agreeable to visit in this format today.  Patient will be traveling soon to [place] on vacation.  Her husband will be accompanying her.  She will be boarding a cruise ship.  She has a longstanding history of obesity.  She has hypothyroidism treated with thyroid replacement medication.  History of depression.  History of hyperlipidemia.  History of allergic rhinitis seen by Dr. Verlin Fester.  Also has moderate persistent asthma.  Also being treated by allergist with omeprazole for reflux symptoms.  She has a history of atopic dermatitis.  History of left thoracic radiculopathy treated sparingly with narcotic medication.  Has received a number of epidural steroids in the past.  She saw Dr. Trenton Gammon in December 2021 and was referred to Dr. Brien Few who suggested a T11 nerve block but her symptoms improved and I do not think she ever had that procedure done.  However she is concerned that traveling could flare this up again and would like to have some narcotic medication on hand during her trip which is entirely reasonable.  She also requests refill on Mycostatin to take on trip as well in case she has an issue with thrush which she has had previously.  She does use an Advair inhaler which contains fluticasone.  Request tablets instead of liquid.  This will be sent in after checking with pharmacy.    Review of Systems see above with no new complaints     Objective:   Physical Exam  Patient is seen virtually in no acute distress.  Vital signs not taken with this visit.  Patient is asking for oxycodone APAP refill to take with her on trip to [place] due to chronic thoracic radiculopathy which can flareup  from time to time and does respond to sparing use of pain medication.      Assessment & Plan:   History of thrush-I have sent in Mycostatin tablets for her to take with her on a trip to [place] should she have an issue with thrush.  Previously was provided with Mycostatin suspension but she thought tablets would be better for travel.  Chronic musculoskeletal pain and history of left thoracic radiculopathy.  Have sent in prescription for oxycodone APAP 5/325 on August 7 to take sparingly by mouth every 8 hours if needed for pain.  She was given #15 tablets with with no refill  Morbid obesity  In May, Transderm Scop was prescribed for her for this upcoming trip as she will be on a cruise ship.  Time spent with this encounter including chart review interviewing patient and E scribing narcotic medication is 20 minutes

## 2022-04-05 ENCOUNTER — Encounter: Payer: Self-pay | Admitting: Internal Medicine

## 2022-04-06 ENCOUNTER — Telehealth: Payer: Self-pay | Admitting: Internal Medicine

## 2022-04-06 MED ORDER — OXYCODONE-ACETAMINOPHEN 5-325 MG PO TABS: ORAL_TABLET | ORAL | 0 refills | Status: DC

## 2022-04-06 NOTE — Telephone Encounter (Signed)
Patient requesting Oxycodone for musculoskeletal pain on trip to [place]. This has been e-scribed. MJB, MD

## 2022-04-08 ENCOUNTER — Encounter (INDEPENDENT_AMBULATORY_CARE_PROVIDER_SITE_OTHER): Payer: Self-pay

## 2022-05-04 ENCOUNTER — Encounter: Payer: Self-pay | Admitting: Internal Medicine

## 2022-05-04 NOTE — Patient Instructions (Addendum)
Oxycodone APAP 5/325 was prescribed on August 7 to take sparingly by mouth every 8 hours.  This was requested after visit on August 3.  Also prescribed Mycostatin tablets to take for history of thrush.  In May, Transderm Scop patches for seasickness prevention were prescribed for her for this upcoming trip on a cruise ship.

## 2022-05-18 ENCOUNTER — Ambulatory Visit (INDEPENDENT_AMBULATORY_CARE_PROVIDER_SITE_OTHER): Payer: Medicare Other

## 2022-05-18 DIAGNOSIS — J4550 Severe persistent asthma, uncomplicated: Secondary | ICD-10-CM

## 2022-06-01 ENCOUNTER — Ambulatory Visit: Payer: Self-pay

## 2022-06-01 ENCOUNTER — Ambulatory Visit (INDEPENDENT_AMBULATORY_CARE_PROVIDER_SITE_OTHER): Payer: Medicare Other | Admitting: Internal Medicine

## 2022-06-01 ENCOUNTER — Encounter: Payer: Self-pay | Admitting: Internal Medicine

## 2022-06-01 VITALS — BP 122/76 | HR 87 | Temp 98.5°F | Resp 18 | Wt 252.8 lb

## 2022-06-01 DIAGNOSIS — K219 Gastro-esophageal reflux disease without esophagitis: Secondary | ICD-10-CM

## 2022-06-01 DIAGNOSIS — J302 Other seasonal allergic rhinitis: Secondary | ICD-10-CM

## 2022-06-01 DIAGNOSIS — B370 Candidal stomatitis: Secondary | ICD-10-CM

## 2022-06-01 DIAGNOSIS — J019 Acute sinusitis, unspecified: Secondary | ICD-10-CM

## 2022-06-01 DIAGNOSIS — J4540 Moderate persistent asthma, uncomplicated: Secondary | ICD-10-CM

## 2022-06-01 DIAGNOSIS — B9689 Other specified bacterial agents as the cause of diseases classified elsewhere: Secondary | ICD-10-CM

## 2022-06-01 DIAGNOSIS — J3089 Other allergic rhinitis: Secondary | ICD-10-CM | POA: Diagnosis not present

## 2022-06-01 MED ORDER — AMOXICILLIN-POT CLAVULANATE 875-125 MG PO TABS: 1.0000 | ORAL_TABLET | Freq: Two times a day (BID) | ORAL | 0 refills | Status: AC

## 2022-06-01 NOTE — Progress Notes (Signed)
Follow Up Note  RE: Tara [name] MRN: [id number] DOB: [birth year] Date of Office Visit: 06/01/2022  Referring provider: [name], MD Primary care provider: [name], MD  Chief Complaint: Cough, Sinus Problem, and Follow-up (Follow up nasal drainage questions regarding flonase vs azelastine/)  History of Present Illness: I had the pleasure of seeing Tara [name] for a follow up visit at the [facility] [place] [facility] [place] on 06/01/2022. She is a 66 y.o. female, who is being followed for persistent asthma, allergic rhinitis, atopic dermatitis . Her previous allergy office visit was on 03/30/22 with Dr. Edison Pace. Today is a regular follow up visit.  History obtained from patient, chart review.  She has had increased sinus drainage since returning a trip from [place].  She has both treating with astelin and benadryl at night.  Still on Singulair, but she stopped her flonase.  .    ASTHMA - Medical therapy: Advair 64mg 1 puff once a day, fasenra '30mg'$  injections every 8 weeks.  - Rescue inhaler use: denies any use  - Symptoms: increased cough from post nasal drip , otherwise denies any wheeze, dyspnea  - Exacerbation history: 0 ABX for respiratory illness since last visit, 0 OCS, 0ED, 0 UC visits in the past year  - ACT: 25 /25 - Adverse effects of medication: denies  - Previous FEV1: 2.14 L, 85% - Biologic Labs on fasenra    Atopic dermatitis: flares mostly legs, arms -current regimen: CeraVe a once a day will forget for emollient, clobetasol and triamcinolone for flares, does not use clobetasol more than 2 weeks -reports use of fragrance/dye free products - sleep on affected - itch controlled   History of reflux/heartburn: yes treated with omeprazole 20 mg daily, famotidine twice a day, followed by GI; Normal EGD 11/2021- -denies any breakthrough symptoms, feels like they are well controlled  - no adverse effects from medications   Assessment and Plan: MOrlenais a 66y.o.  female with: Moderate persistent asthma without complication - Plan: Spirometry with Graph  Seasonal and perennial allergic rhinitis  Acute bacterial sinusitis  Gastroesophageal reflux disease without esophagitis  Oral candidiasis Plan: Patient Instructions  Moderate persistent asthma:  well controlled  -Breathing test today showed:  looked great!! - Lets step down your advair to every other day    PLAN:  -  Biologic:  Continue Faesenra '30mg'$  injections every 8 weeks (dosing changed today)  - Daily controller medication(s): Singulair '10mg'$  daily and Advair 440m 1 puff every other day (stepped down today) - Prior to physical activity: albuterol 2 puffs 10-15 minutes before physical activity. - Rescue medications: albuterol 4 puffs every 4-6 hours as needed - Get Influenza Vaccine and appropriate Pneumonia and COVID 19 boosters  - Asthma control goals:  * Full participation in all desired activities (may need albuterol before activity) * Albuterol use two time or less a week on average (not counting use with activity) * Cough interfering with sleep two time or less a month * Oral steroids no more than once a year * No hospitalizations   Allergic Rhinitis: not well controlled  -flare from sinus infection:  Start augmentin 875/'125mg'$  twice a day for 5 dasys  -Continue avoidance measures - Continue with:  Benadryl '25mg'$  at night (this may worsen dyr eye symptoms), Singulair (montelukast) '10mg'$  daily, and Astelin (azelastine) 2 sprays per nostril 1-2 times daily as needed (can take astelin and flonase at the same time  - Pataday eye drops 1 drop  per eye twice a day as needed (stop if worsens dry eye symptoms), samples given today    Atopic Dermatitis: Not well controlled  - Skin is very dry, increase moisturizer to at least twice a day which will prevent flares  Daily Care For Maintenance (daily and continue even once eczema controlled) - Recommend avoiding detergents, soaps or lotions  with fragrances/dyes, and instead using products which are hypoallergenic, use second rinse cycle when washing clothes -Wear lose breathable clothing, avoid wool -Avoid extremes of humidity - Limit showers/baths to 5 minutes and use luke warm water instead of hot, pat dry following baths, and apply moisturizer  For Flares:(add this to maintenance therapy if needed for flares) - Clobetasol 0.0.5% to body for severe flares-apply topically twice daily to red, raised, thickened areas of skin, followed by moisturizer   -Use for 5 days then step down to triamcinolone 0.1% ointment  - Triamcinolone 0.1% to body for moderate flares-apply topically twice daily to red, raised areas of skin, followed by moisturizer - Hydrocortisone 2.5% to face, armpit or groin-apply topically twice daily to red, raised areas of skin, followed by moisturizer    Reflux - Continue lifestyle and dietary modifications - Continue omeprazole 20 mg twice a day and famotidine twice a day - Continue to follow with gastroenterology   Oral candidiasis: controlled no current flares  Follow up: 3 months   Thank you so much for letting me partake in your care today.  Don't hesitate to reach out if you have any additional concerns!  Roney Marion, MD  [facility] [place] No follow-ups on file.  Meds ordered this encounter  Medications   amoxicillin-clavulanate (AUGMENTIN) 875-125 MG tablet    Sig: Take 1 tablet by mouth 2 (two) times daily for 10 days.    Dispense:  20 tablet    Refill:  0    Lab Orders  No laboratory test(s) ordered today   Diagnostics: Spirometry:  Tracings reviewed. Her effort: Good reproducible efforts. FVC: 2.50 L FEV1: 2.14 L, 85% predicted FEV1/FVC ratio: 81% Interpretation: Spirometry consistent with normal pattern.  Please see scanned spirometry results for details.   Results interpreted by myself during this encounter and discussed with  patient/family.   Medication List:  Current Outpatient Medications  Medication Sig Dispense Refill   acetaminophen (TYLENOL) 500 MG tablet Take 500 mg by mouth every 6 (six) hours as needed for mild pain, moderate pain or headache.      albuterol (VENTOLIN HFA) 108 (90 Base) MCG/ACT inhaler Two puffs every 4 hours if needed for wheezing or coughing .  May use 2 puffs 5-15 minutes prior to exercise. 18 g 1   amoxicillin-clavulanate (AUGMENTIN) 875-125 MG tablet Take 1 tablet by mouth 2 (two) times daily for 10 days. 20 tablet 0   azelastine (ASTELIN) 0.1 % nasal spray Place 1-2 sprays into both nostrils 2 (two) times daily as needed. 30 mL 5   Benralizumab 30 MG/ML SOSY Inject 30 mg into the skin every 28 (twenty-eight) days.     clobetasol ointment (TEMOVATE) 1.91 % Apply 1 Application topically 2 (two) times daily. 30 g 0   famotidine (PEPCID) 20 MG tablet Take 1 tablet (20 mg total) by mouth 2 (two) times daily. 180 tablet 1   fluticasone-salmeterol (ADVAIR HFA) 115-21 MCG/ACT inhaler Inhale 1 puff into the lungs 2 (two) times daily.     levothyroxine (SYNTHROID) 88 MCG tablet TAKE ONE TABLET BY MOUTH DAILY 90 tablet 1  metFORMIN (GLUCOPHAGE) 500 MG tablet TAKE ONE TABLET BY MOUTH TWICE A DAY WITH MEALS 180 tablet 2   montelukast (SINGULAIR) 10 MG tablet Take 1 tablet (10 mg total) by mouth at bedtime. 90 tablet 3   nystatin (MYCOSTATIN) 100000 UNIT/ML suspension Take 5 ml 4 times a day for 7 days. Retain in mouth as long as possible 150 mL 1   nystatin (MYCOSTATIN) 500000 units TABS tablet Take 1 tablet (500,000 Units total) by mouth 2 (two) times daily. 30 tablet 0   omeprazole (PRILOSEC) 20 MG capsule TAKE ONE CAPSULE BY MOUTH TWICE A DAY BEFORE MEALS 180 capsule 1   OSTEO BI-FLEX REGULAR STRENGTH 250-200 MG TABS SMARTSIG:1 By Mouth     Probiotic Product (PROBIOTIC DAILY PO) Take 1 mg by mouth daily.     rosuvastatin (CRESTOR) 5 MG tablet TAKE ONE TABLET BY MOUTH DAILY 90 tablet 3    scopolamine (TRANSDERM-SCOP) 1 MG/3DAYS Place 1 patch (1.5 mg total) onto the skin every 3 (three) days. 10 patch 12   Spacer/Aero-Holding Chambers (AEROCHAMBER MV) inhaler Use as instructed 1 each 2   triamcinolone ointment (KENALOG) 0.1 % Apply 1 Application topically 2 (two) times daily. 30 g 0   oxyCODONE-acetaminophen (PERCOCET/ROXICET) 5-325 MG tablet One tab by mouth with food  every 8 hours as needed for pain (Patient not taking: Reported on 06/01/2022) 15 tablet 0   Current Facility-Administered Medications  Medication Dose Route Frequency Provider Last Rate Last Admin   Benralizumab SOSY 30 mg  30 mg Subcutaneous Q28 days Clemon Chambers, MD   30 mg at 05/18/22 1044   Allergies: Allergies  Allergen Reactions   Aspirin Other (See Comments)    Pt has a hx of TTP.    Nsaids Other (See Comments)    Pt has a hx of TTP.    I reviewed her past medical history, social history, family history, and environmental history and no significant changes have been reported from her previous visit.  ROS: All others negative except as noted per HPI.   Objective: BP 122/76 (BP Location: Right Arm, Patient Position: Sitting, Cuff Size: Large)   Pulse 87   Temp 98.5 F (36.9 C) (Temporal)   Resp 18   Wt 252 lb 12.8 oz (114.7 kg)   SpO2 99%   BMI 40.80 kg/m  Body mass index is 40.8 kg/m. General Appearance:  Alert, cooperative, no distress, appears stated age  Head:  Normocephalic, without obvious abnormality, atraumatic  Eyes:  Conjunctiva clear, EOM's intact  Nose: Nares normal,  erythematous nasal mucosa with yellow rhinorrhea, sinus tenderness on bilateral frontal sinuses, hypertrophic turbinates, no visible anterior polyps, and septum midline  Throat: Lips, tongue normal; teeth and gums normal, + cobblestoning  Neck: Supple, symmetrical  Lungs:   clear to auscultation bilaterally, Respirations unlabored, intermittent dry coughing  Heart:  regular rate and rhythm and no murmur, Appears  well perfused  Extremities: No edema  Skin: Skin color, texture, turgor normal,   eczematous patches on left lower leg left upper leg and right upper arm, diffuse xerosis  Neurologic: No gross deficits   Previous notes and tests were reviewed. The plan was reviewed with the patient/family, and all questions/concerned were addressed.  It was my pleasure to see Tara [name] today and participate in her care. Please feel free to contact me with any questions or concerns.  Sincerely,  Roney Marion, MD  Allergy & Immunology  [facility] [place] [facility] Office: [phone]

## 2022-06-01 NOTE — Patient Instructions (Addendum)
Moderate persistent asthma:  well controlled  -Breathing test today showed:  looked great!! - Lets step down your advair to every other day    PLAN:  -  Biologic:  Continue Faesenra '30mg'$  injections every 8 weeks (dosing changed today)  - Daily controller medication(s): Singulair '10mg'$  daily and Advair 64mg 1 puff every other day (stepped down today) - Prior to physical activity: albuterol 2 puffs 10-15 minutes before physical activity. - Rescue medications: albuterol 4 puffs every 4-6 hours as needed - Get Influenza Vaccine and appropriate Pneumonia and COVID 19 boosters  - Asthma control goals:  * Full participation in all desired activities (may need albuterol before activity) * Albuterol use two time or less a week on average (not counting use with activity) * Cough interfering with sleep two time or less a month * Oral steroids no more than once a year * No hospitalizations   Allergic Rhinitis: not well controlled  -flare from sinus infection:  Start augmentin 875/'125mg'$  twice a day for 5 dasys  -Continue avoidance measures - Continue with:  Benadryl '25mg'$  at night (this may worsen dyr eye symptoms), Singulair (montelukast) '10mg'$  daily, and Astelin (azelastine) 2 sprays per nostril 1-2 times daily as needed (can take astelin and flonase at the same time  - Pataday eye drops 1 drop per eye twice a day as needed (stop if worsens dry eye symptoms), samples given today    Atopic Dermatitis: Not well controlled  - Skin is very dry, increase moisturizer to at least twice a day which will prevent flares  Daily Care For Maintenance (daily and continue even once eczema controlled) - Recommend avoiding detergents, soaps or lotions with fragrances/dyes, and instead using products which are hypoallergenic, use second rinse cycle when washing clothes -Wear lose breathable clothing, avoid wool -Avoid extremes of humidity - Limit showers/baths to 5 minutes and use luke warm water instead of hot, pat  dry following baths, and apply moisturizer  For Flares:(add this to maintenance therapy if needed for flares) - Clobetasol 0.0.5% to body for severe flares-apply topically twice daily to red, raised, thickened areas of skin, followed by moisturizer   -Use for 5 days then step down to triamcinolone 0.1% ointment  - Triamcinolone 0.1% to body for moderate flares-apply topically twice daily to red, raised areas of skin, followed by moisturizer - Hydrocortisone 2.5% to face, armpit or groin-apply topically twice daily to red, raised areas of skin, followed by moisturizer    Reflux - Continue lifestyle and dietary modifications - Continue omeprazole 20 mg twice a day and famotidine twice a day - Continue to follow with gastroenterology   Oral candidiasis: controlled no current flares  Follow up: 3 months   Thank you so much for letting me partake in your care today.  Don't hesitate to reach out if you have any additional concerns!  ERoney Marion MD  [facility] [place]

## 2022-06-11 ENCOUNTER — Other Ambulatory Visit: Payer: Self-pay | Admitting: Internal Medicine

## 2022-07-13 ENCOUNTER — Ambulatory Visit: Payer: Medicare Other

## 2022-07-17 DIAGNOSIS — J4550 Severe persistent asthma, uncomplicated: Secondary | ICD-10-CM

## 2022-07-20 ENCOUNTER — Ambulatory Visit (INDEPENDENT_AMBULATORY_CARE_PROVIDER_SITE_OTHER): Payer: Medicare Other

## 2022-07-20 DIAGNOSIS — J4550 Severe persistent asthma, uncomplicated: Secondary | ICD-10-CM

## 2022-08-12 ENCOUNTER — Telehealth: Payer: Self-pay | Admitting: Internal Medicine

## 2022-08-12 NOTE — Telephone Encounter (Signed)
[name] [phone]  Tara [name] called to say she is in a lot of pain 8-10 and sometimes 9-10. She said it is not to bad when she first gets up, however as she is up it gets worse as they days goes on. She has an appointment with the neurosurgeon next week, she just needs something to help her until then.

## 2022-08-12 NOTE — Telephone Encounter (Signed)
scheduled

## 2022-08-13 ENCOUNTER — Telehealth (INDEPENDENT_AMBULATORY_CARE_PROVIDER_SITE_OTHER): Payer: Medicare Other | Admitting: Internal Medicine

## 2022-08-13 VITALS — Wt 250.0 lb

## 2022-08-13 DIAGNOSIS — M4804 Spinal stenosis, thoracic region: Secondary | ICD-10-CM

## 2022-08-13 DIAGNOSIS — M47814 Spondylosis without myelopathy or radiculopathy, thoracic region: Secondary | ICD-10-CM | POA: Diagnosis not present

## 2022-08-13 MED ORDER — OXYCODONE-ACETAMINOPHEN 10-325 MG PO TABS: 1.0000 | ORAL_TABLET | Freq: Three times a day (TID) | ORAL | 0 refills | Status: AC | PRN

## 2022-08-13 NOTE — Progress Notes (Signed)
     Subjective:    Patient ID: Tara [name], female    DOB: December 12, 1955, 66 y.o.   MRN: [id number]  HPI 66 year old Female seen via interactive audio and video telecommunications.  She is identified using 2 identifiers as Tara [name]. Tara [name], a patient in this practice.  She is at her home and I am at my office.  She is agreeable to visit in this format today.  Patient called yesterday and told front office manager that she was in a lot of pain on a scale between 8 and 10 and sometimes a 9 or 10.  It is the chronic back pain that she has had for a number of years.  She has an appointment to see neurosurgeon next week but does not feel that she can be without some pain relief until then.  She is requesting some oral pain medication.  She has a history of left thoracic radiculopathy.  History of impaired glucose tolerance.  Longstanding history of obesity.  History of bilateral hip arthroplasties.  History of hypothyroidism, depression and dependent edema.  It looks like she saw Dr. Brien Few in January 2022 at [facility] for chronic lower left thoracic pain which was felt to be radicular in nature.  He noted that she had not responded to acupuncture, TENS unit physical therapy or various analgesics including gabapentin.  She has had epidural steroid injections in the past elsewhere.  MRI showed left T-11 spondylosis as well as foraminal stenosis.  She also was felt to have multilevel degenerative disc disease and scoliosis.  Was diagnosed with chronic left lower thoracic pain and was given a selective nerve root block left T11.  This seemed to help the patient.  Dr. Brien Few subsequently left [facility] and has relocated to a new office.  Patient says she just found out where his new location is and will be seeing him next week.  She is looking forward to having a nerve block at that time.  Review of Systems-see above has no fever, chills, nausea or vomiting just left  thoracic pain     Objective:   Physical Exam  Patient is seen for truly and not examined today.  She gives a clear concise history of left sided thoracic pain which she has had for a number of years.  She says she is in a considerable amount of pain and feels that she needs narcotic pain medication at this time.  Has always used narcotic pain medication reliably and responsibly.      Assessment & Plan:  T11 spondylosis and foraminal stenosis-to see Dr. Brien Few next week  Plan: She is requesting narcotic pain medication for thoracic pain and oxycodone 10/325 (# 15 tablets) sent in to use sparingly for pain until she can see Dr. Brien Few next week.

## 2022-08-29 ENCOUNTER — Encounter: Payer: Self-pay | Admitting: Internal Medicine

## 2022-08-29 NOTE — Patient Instructions (Addendum)
Oxycodone 10/325 (15 tablets) to use sparingly for pain until she can see Dr. Brien Few next week.  She is hoping to receive a T11 selective nerve root block for chronic left lower thoracic pain.

## 2022-09-01 ENCOUNTER — Encounter: Payer: Self-pay | Admitting: Internal Medicine

## 2022-09-01 ENCOUNTER — Ambulatory Visit (INDEPENDENT_AMBULATORY_CARE_PROVIDER_SITE_OTHER): Payer: Medicare Other | Admitting: Internal Medicine

## 2022-09-01 VITALS — BP 128/80 | HR 98 | Temp 97.9°F | Resp 19

## 2022-09-01 DIAGNOSIS — J3089 Other allergic rhinitis: Secondary | ICD-10-CM

## 2022-09-01 DIAGNOSIS — L209 Atopic dermatitis, unspecified: Secondary | ICD-10-CM

## 2022-09-01 DIAGNOSIS — H04123 Dry eye syndrome of bilateral lacrimal glands: Secondary | ICD-10-CM

## 2022-09-01 DIAGNOSIS — K219 Gastro-esophageal reflux disease without esophagitis: Secondary | ICD-10-CM | POA: Diagnosis not present

## 2022-09-01 DIAGNOSIS — J4550 Severe persistent asthma, uncomplicated: Secondary | ICD-10-CM | POA: Diagnosis not present

## 2022-09-01 DIAGNOSIS — L2084 Intrinsic (allergic) eczema: Secondary | ICD-10-CM

## 2022-09-01 DIAGNOSIS — B370 Candidal stomatitis: Secondary | ICD-10-CM

## 2022-09-01 DIAGNOSIS — M1990 Unspecified osteoarthritis, unspecified site: Secondary | ICD-10-CM

## 2022-09-01 MED ORDER — DUPILUMAB 300 MG/2ML ~~LOC~~ SOSY
600.0000 mg | PREFILLED_SYRINGE | Freq: Once | SUBCUTANEOUS | Status: AC
  Administered 2022-09-01: 600 mg via SUBCUTANEOUS

## 2022-09-01 NOTE — Progress Notes (Unsigned)
Follow Up Note  RE: Tara [name] MRN: [id number] DOB: 1955/11/25 Date of Office Visit: 09/01/2022  Referring provider: [name], MD Primary care provider: [name], MD  Chief Complaint: No chief complaint on file.  History of Present Illness: I had the pleasure of seeing Tara [name] for a follow up visit at the [facility] [place] [facility] [place] on 09/01/2022. She is a 67 y.o. female, who is being followed for ***. Her previous allergy office visit was on *** with {Blank single:19197::"Dr. Dennis","Dr. Earlean Polka. Kim","Dr. Kozlow","Dr. Bardelas","Dr. Bobbitt","Dr. Gallagher","Dr. Rowan Blase, FNP","Christine Quita Skye FNP"}. Today is a {Blank single:19197::"regular follow up visit","new complaint visit of ***","skin testing and follow up visit"}.  History obtained from patient, chart review and {Blank single:19197::"mother","father","interpreter"}.  ***  Assessment and Plan: Tara [name] is a 67 y.o. female with: No diagnosis found. Plan: Patient Instructions  Moderate persistent asthma:  well controlled  -Breathing test today showed:  looked great!! - Lets step down your advair to every other day    PLAN:  -  Biologic:  Continue Faesenra '30mg'$  injections every 8 weeks (dosing changed today)  - Daily controller medication(s): Singulair '10mg'$  daily and Advair 97mg 1 puff every other day (stepped down today) - Prior to physical activity: albuterol 2 puffs 10-15 minutes before physical activity. - Rescue medications: albuterol 4 puffs every 4-6 hours as needed - Get Influenza Vaccine and appropriate Pneumonia and COVID 19 boosters  - Asthma control goals:  * Full participation in all desired activities (may need albuterol before activity) * Albuterol use two time or less a week on average (not counting use with activity) * Cough interfering with sleep two time or less a month * Oral steroids no more than once a year * No hospitalizations   Allergic Rhinitis: not well  controlled  -flare from sinus infection:  Start augmentin 875/'125mg'$  twice a day for 5 dasys  -Continue avoidance measures - Continue with:  Benadryl '25mg'$  at night (this may worsen dyr eye symptoms), Singulair (montelukast) '10mg'$  daily, and Astelin (azelastine) 2 sprays per nostril 1-2 times daily as needed (can take astelin and flonase at the same time  - Pataday eye drops 1 drop per eye twice a day as needed (stop if worsens dry eye symptoms), samples given today    Atopic Dermatitis: Not well controlled  - Skin is very dry, increase moisturizer to at least twice a day which will prevent flares  Daily Care For Maintenance (daily and continue even once eczema controlled) - Recommend avoiding detergents, soaps or lotions with fragrances/dyes, and instead using products which are hypoallergenic, use second rinse cycle when washing clothes -Wear lose breathable clothing, avoid wool -Avoid extremes of humidity - Limit showers/baths to 5 minutes and use luke warm water instead of hot, pat dry following baths, and apply moisturizer  For Flares:(add this to maintenance therapy if needed for flares) - Clobetasol 0.0.5% to body for severe flares-apply topically twice daily to red, raised, thickened areas of skin, followed by moisturizer   -Use for 5 days then step down to triamcinolone 0.1% ointment  - Triamcinolone 0.1% to body for moderate flares-apply topically twice daily to red, raised areas of skin, followed by moisturizer - Hydrocortisone 2.5% to face, armpit or groin-apply topically twice daily to red, raised areas of skin, followed by moisturizer    Reflux - Continue lifestyle and dietary modifications - Continue omeprazole 20 mg twice a day and famotidine twice a day - Continue to follow with gastroenterology  Oral candidiasis: controlled no current flares  Follow up: 3 months   Thank you so much for letting me partake in your care today.  Don't hesitate to reach out if you have any  additional concerns!  Roney Marion, MD  [facility] [place] [place] No follow-ups on file.  No orders of the defined types were placed in this encounter.   Lab Orders  No laboratory test(s) ordered today   Diagnostics: Spirometry:  Tracings reviewed. Her effort: {Blank single:19197::"Good reproducible efforts.","It was hard to get consistent efforts and there is a question as to whether this reflects a maximal maneuver.","Poor effort, data can not be interpreted."} FVC: ***L FEV1: ***L, ***% predicted FEV1/FVC ratio: ***% Interpretation: {Blank single:19197::"Spirometry consistent with mild obstructive disease","Spirometry consistent with moderate obstructive disease","Spirometry consistent with severe obstructive disease","Spirometry consistent with possible restrictive disease","Spirometry consistent with mixed obstructive and restrictive disease","Spirometry uninterpretable due to technique","Spirometry consistent with normal pattern","No overt abnormalities noted given today's efforts"}.  Please see scanned spirometry results for details.  Skin Testing: {Blank single:19197::"Select foods","Environmental allergy panel","Environmental allergy panel and select foods","Food allergy panel","None","Deferred due to recent antihistamines use"}. *** Results interpreted by myself during this encounter and discussed with patient/family.   Medication List:  Current Outpatient Medications  Medication Sig Dispense Refill   acetaminophen (TYLENOL) 500 MG tablet Take 500 mg by mouth every 6 (six) hours as needed for mild pain, moderate pain or headache.      albuterol (VENTOLIN HFA) 108 (90 Base) MCG/ACT inhaler Two puffs every 4 hours if needed for wheezing or coughing .  May use 2 puffs 5-15 minutes prior to exercise. 18 g 1   azelastine (ASTELIN) 0.1 % nasal spray Place 1-2 sprays into both nostrils 2 (two) times daily as needed. 30 mL 5   Benralizumab 30 MG/ML SOSY Inject 30  mg into the skin every 28 (twenty-eight) days.     clobetasol ointment (TEMOVATE) 0.93 % Apply 1 Application topically 2 (two) times daily. 30 g 0   famotidine (PEPCID) 20 MG tablet Take 1 tablet (20 mg total) by mouth 2 (two) times daily. 180 tablet 1   fluticasone-salmeterol (ADVAIR HFA) 115-21 MCG/ACT inhaler Inhale 1 puff into the lungs 2 (two) times daily.     levothyroxine (SYNTHROID) 88 MCG tablet TAKE ONE TABLET BY MOUTH DAILY 90 tablet 1   metFORMIN (GLUCOPHAGE) 500 MG tablet TAKE ONE TABLET BY MOUTH TWICE A DAY WITH MEALS 180 tablet 2   montelukast (SINGULAIR) 10 MG tablet Take 1 tablet (10 mg total) by mouth at bedtime. 90 tablet 3   nystatin (MYCOSTATIN) 100000 UNIT/ML suspension Take 5 ml 4 times a day for 7 days. Retain in mouth as long as possible 150 mL 1   nystatin (MYCOSTATIN) 500000 units TABS tablet Take 1 tablet (500,000 Units total) by mouth 2 (two) times daily. 30 tablet 0   omeprazole (PRILOSEC) 20 MG capsule TAKE ONE CAPSULE BY MOUTH TWICE A DAY BEFORE MEALS 180 capsule 1   OSTEO BI-FLEX REGULAR STRENGTH 250-200 MG TABS SMARTSIG:1 By Mouth     Probiotic Product (PROBIOTIC DAILY PO) Take 1 mg by mouth daily.     rosuvastatin (CRESTOR) 5 MG tablet TAKE ONE TABLET BY MOUTH DAILY 90 tablet 3   scopolamine (TRANSDERM-SCOP) 1 MG/3DAYS Place 1 patch (1.5 mg total) onto the skin every 3 (three) days. 10 patch 12   Spacer/Aero-Holding Chambers (AEROCHAMBER MV) inhaler Use as instructed 1 each 2   triamcinolone ointment (KENALOG) 0.1 % Apply 1  Application topically 2 (two) times daily. 30 g 0   Current Facility-Administered Medications  Medication Dose Route Frequency Provider Last Rate Last Admin   Benralizumab SOSY 30 mg  30 mg Subcutaneous Q28 days Clemon Chambers, MD   30 mg at 07/20/22 1033   Allergies: Allergies  Allergen Reactions   Aspirin Other (See Comments)    Pt has a hx of TTP.    Nsaids Other (See Comments)    Pt has a hx of TTP.    I reviewed her past medical  history, social history, family history, and environmental history and no significant changes have been reported from her previous visit.  ROS: All others negative except as noted per HPI.   Objective: There were no vitals taken for this visit. There is no height or weight on file to calculate BMI. General Appearance:  Alert, cooperative, no distress, appears stated age  Head:  Normocephalic, without obvious abnormality, atraumatic  Eyes:  Conjunctiva clear, EOM's intact  Nose: Nares normal, {Blank multiple:19196:a:"***","hypertrophic turbinates","normal mucosa","no visible anterior polyps","septum midline"}  Throat: Lips, tongue normal; teeth and gums normal, {Blank multiple:19196:a:"***","normal posterior oropharynx","tonsils 2+","tonsils 3+","no tonsillar exudate","+ cobblestoning"}  Neck: Supple, symmetrical  Lungs:   {Blank multiple:19196:a:"***","clear to auscultation bilaterally","end-expiratory wheezing","wheezing throughout"}, Respirations unlabored, {Blank multiple:19196:a:"***","no coughing","intermittent dry coughing"}  Heart:  {Blank multiple:19196:a:"***","regular rate and rhythm","no murmur"}, Appears well perfused  Extremities: No edema  Skin: Skin color, texture, turgor normal, no rashes or lesions on visualized portions of skin {Blank multiple:19196:a:""***"}  Neurologic: No gross deficits   Previous notes and tests were reviewed. The plan was reviewed with the patient/family, and all questions/concerned were addressed.  It was my pleasure to see Peniel today and participate in her care. Please feel free to contact me with any questions or concerns.  Sincerely,  Roney Marion, MD  Allergy & Immunology  [facility] Office: [phone]

## 2022-09-01 NOTE — Progress Notes (Signed)
Immunotherapy   Patient Details  Name: Tara [name] MRN: [id number] Date of Birth: [birth year]  09/01/2022  [name] Started Dupixent 300 mg '600mg'$  loading dose was given 09/01/22 RMA Frequency: Every 2 weeks Epi-Pen: No Consent signed and patient instructions given.   Marcos Eke 09/01/2022, 4:34 PM

## 2022-09-01 NOTE — Patient Instructions (Addendum)
Moderate persistent asthma:  well controlled  -Breathing test today showed:  looked great!! - Lets step down your advair to every other day    PLAN:  -  Biologic:  Continue Faesenra '30mg'$  injections every 8 weeks  - Daily controller medication(s): Singulair '10mg'$  daily and Advair 11mg 1 puff every other day (stepped down today) - Prior to physical activity: albuterol 2 puffs 10-15 minutes before physical activity. - Rescue medications: albuterol 4 puffs every 4-6 hours as needed - Get Influenza Vaccine and appropriate Pneumonia and COVID 19 boosters  - Asthma control goals:  * Full participation in all desired activities (may need albuterol before activity) * Albuterol use two time or less a week on average (not counting use with activity) * Cough interfering with sleep two time or less a month * Oral steroids no more than once a year * No hospitalizations   Allergic Rhinitis: improved  -flare from sinus infection:  Start augmentin 875/'125mg'$  twice a day for 5 dasys  -Continue avoidance measures - Continue with:  Benadryl '25mg'$  at night (this may worsen dyr eye symptoms), Singulair (montelukast) '10mg'$  daily, and Astelin (azelastine) 2 sprays per nostril 1-2 times daily as needed (can take astelin and flonase at the same time  - Pataday eye drops 1 drop per eye twice a day as needed (stop if worsens dry eye symptoms), samples given today    Atopic Dermatitis: Not well controlled  - Skin is very dry, increase moisturizer to at least twice a day which will prevent flares  Daily Care For Maintenance (daily and continue even once eczema controlled) - Recommend avoiding detergents, soaps or lotions with fragrances/dyes, and instead using products which are hypoallergenic, use second rinse cycle when washing clothes -Wear lose breathable clothing, avoid wool -Avoid extremes of humidity - Limit showers/baths to 5 minutes and use luke warm water instead of hot, pat dry following baths, and apply  moisturizer  For Flares:(add this to maintenance therapy if needed for flares) - Clobetasol 0.0.5% to body for severe flares-apply topically twice daily to red, raised, thickened areas of skin, followed by moisturizer   -Use for 5 days then step down to triamcinolone 0.1% ointment  - Triamcinolone 0.1% to body for moderate flares-apply topically twice daily to red, raised areas of skin, followed by moisturizer - Hydrocortisone 2.5% to face, armpit or groin-apply topically twice daily to red, raised areas of skin, followed by moisturizer    Reflux - Continue lifestyle and dietary modifications - Continue omeprazole 20 mg twice a day and famotidine twice a day - Continue to follow with gastroenterology   Oral candidiasis: controlled no current flares  Follow up: 3 months   Thank you so much for letting me partake in your care today.  Don't hesitate to reach out if you have any additional concerns!  ERoney Marion MD  [facility] [place]

## 2022-09-02 DIAGNOSIS — B370 Candidal stomatitis: Secondary | ICD-10-CM

## 2022-09-02 DIAGNOSIS — J4550 Severe persistent asthma, uncomplicated: Secondary | ICD-10-CM | POA: Insufficient documentation

## 2022-09-02 DIAGNOSIS — H04123 Dry eye syndrome of bilateral lacrimal glands: Secondary | ICD-10-CM | POA: Insufficient documentation

## 2022-09-02 HISTORY — DX: Severe persistent asthma, uncomplicated: J45.50

## 2022-09-02 HISTORY — DX: Candidal stomatitis: B37.0

## 2022-09-02 HISTORY — DX: Dry eye syndrome of bilateral lacrimal glands: H04.123

## 2022-09-07 ENCOUNTER — Telehealth: Payer: Self-pay | Admitting: *Deleted

## 2022-09-07 NOTE — Telephone Encounter (Signed)
Called patient and advised change to Dupixent will need to go thru PAP for free drug due to part d. Will mail app and need patient part D card sent back to me. I will be in touch regarding status of app.

## 2022-09-07 NOTE — Telephone Encounter (Signed)
-----   Message from Roney Marion, MD sent at 09/02/2022  1:33 PM EST ----- I would like to switch Tara [name] from [place] to [place] for better coverage of her comorbid atopic dermatitis.  She received loading dose of 600 mg on 09/01/2022.  Her dose would be 300 mg every 2 weeks and she prefers in clinic administration.  Thank you!

## 2022-09-08 NOTE — Telephone Encounter (Signed)
Thank yoU !!! 

## 2022-09-09 ENCOUNTER — Other Ambulatory Visit: Payer: Self-pay | Admitting: Internal Medicine

## 2022-09-14 ENCOUNTER — Ambulatory Visit (INDEPENDENT_AMBULATORY_CARE_PROVIDER_SITE_OTHER): Payer: Medicare Other

## 2022-09-14 DIAGNOSIS — J4550 Severe persistent asthma, uncomplicated: Secondary | ICD-10-CM | POA: Diagnosis not present

## 2022-09-14 MED ORDER — DUPILUMAB 300 MG/2ML ~~LOC~~ SOSY
300.0000 mg | PREFILLED_SYRINGE | SUBCUTANEOUS | Status: DC
  Administered 2022-09-01 – 2023-05-19 (×20): 300 mg via SUBCUTANEOUS

## 2022-09-17 ENCOUNTER — Other Ambulatory Visit: Payer: Self-pay | Admitting: Internal Medicine

## 2022-09-30 ENCOUNTER — Ambulatory Visit (INDEPENDENT_AMBULATORY_CARE_PROVIDER_SITE_OTHER): Payer: Medicare Other | Admitting: *Deleted

## 2022-09-30 DIAGNOSIS — J4550 Severe persistent asthma, uncomplicated: Secondary | ICD-10-CM

## 2022-10-02 LAB — HM DIABETES EYE EXAM

## 2022-10-12 ENCOUNTER — Ambulatory Visit (INDEPENDENT_AMBULATORY_CARE_PROVIDER_SITE_OTHER): Payer: Medicare Other

## 2022-10-12 DIAGNOSIS — J4550 Severe persistent asthma, uncomplicated: Secondary | ICD-10-CM

## 2022-10-19 ENCOUNTER — Telehealth: Payer: Self-pay | Admitting: Family

## 2022-10-19 ENCOUNTER — Other Ambulatory Visit: Payer: Self-pay

## 2022-10-19 DIAGNOSIS — R932 Abnormal findings on diagnostic imaging of liver and biliary tract: Secondary | ICD-10-CM

## 2022-10-19 DIAGNOSIS — K7030 Alcoholic cirrhosis of liver without ascites: Secondary | ICD-10-CM

## 2022-10-19 MED ORDER — TRIAMCINOLONE ACETONIDE 0.1 % EX OINT: 1.0000 | TOPICAL_OINTMENT | Freq: Two times a day (BID) | CUTANEOUS | 0 refills | Status: AC

## 2022-10-19 MED ORDER — ADVAIR HFA 45-21 MCG/ACT IN AERO: 2.0000 | INHALATION_SPRAY | Freq: Two times a day (BID) | RESPIRATORY_TRACT | 5 refills | Status: AC

## 2022-10-19 MED ORDER — OMEPRAZOLE 20 MG PO CPDR: DELAYED_RELEASE_CAPSULE | ORAL | 1 refills | Status: DC

## 2022-10-19 MED ORDER — ALBUTEROL SULFATE HFA 108 (90 BASE) MCG/ACT IN AERS: INHALATION_SPRAY | RESPIRATORY_TRACT | 1 refills | Status: AC

## 2022-10-19 MED ORDER — CLOBETASOL PROPIONATE 0.05 % EX OINT: 1.0000 | TOPICAL_OINTMENT | Freq: Two times a day (BID) | CUTANEOUS | 0 refills | Status: AC

## 2022-10-19 NOTE — Telephone Encounter (Signed)
Pt request refill for omeprazole

## 2022-10-19 NOTE — Telephone Encounter (Signed)
oMeprazole was sent in already

## 2022-10-20 NOTE — Addendum Note (Signed)
Addended by: Carin Hock on: 10/20/2022 05:11 PM   Modules accepted: Orders

## 2022-10-26 ENCOUNTER — Telehealth: Payer: Self-pay

## 2022-10-26 ENCOUNTER — Ambulatory Visit (INDEPENDENT_AMBULATORY_CARE_PROVIDER_SITE_OTHER): Payer: Medicare Other

## 2022-10-26 DIAGNOSIS — J4550 Severe persistent asthma, uncomplicated: Secondary | ICD-10-CM

## 2022-10-26 NOTE — Telephone Encounter (Signed)
Patient came in for a Dupixent injection today and wanted me to let the doctor know that she has been experiencing joint pain in her left index finger for about 1 1/2 weeks after taking the shot. She does have arthritis in her left hand but the pain in the finger is new. Please advise.

## 2022-10-28 NOTE — Telephone Encounter (Signed)
Pt informed and stated understanding

## 2022-11-09 ENCOUNTER — Ambulatory Visit (INDEPENDENT_AMBULATORY_CARE_PROVIDER_SITE_OTHER): Payer: Medicare Other

## 2022-11-09 DIAGNOSIS — J4550 Severe persistent asthma, uncomplicated: Secondary | ICD-10-CM | POA: Diagnosis not present

## 2022-11-23 ENCOUNTER — Ambulatory Visit (INDEPENDENT_AMBULATORY_CARE_PROVIDER_SITE_OTHER): Payer: Medicare Other

## 2022-11-23 ENCOUNTER — Ambulatory Visit: Payer: Medicare Other

## 2022-11-23 DIAGNOSIS — J4550 Severe persistent asthma, uncomplicated: Secondary | ICD-10-CM | POA: Diagnosis not present

## 2022-12-01 ENCOUNTER — Encounter: Payer: Self-pay | Admitting: Internal Medicine

## 2022-12-01 ENCOUNTER — Ambulatory Visit (INDEPENDENT_AMBULATORY_CARE_PROVIDER_SITE_OTHER): Payer: Medicare Other | Admitting: Internal Medicine

## 2022-12-01 ENCOUNTER — Other Ambulatory Visit: Payer: Self-pay

## 2022-12-01 VITALS — BP 128/72 | HR 76 | Temp 98.2°F | Resp 18 | Ht 67.0 in | Wt 252.0 lb

## 2022-12-01 DIAGNOSIS — J3089 Other allergic rhinitis: Secondary | ICD-10-CM | POA: Diagnosis not present

## 2022-12-01 DIAGNOSIS — L2084 Intrinsic (allergic) eczema: Secondary | ICD-10-CM | POA: Diagnosis not present

## 2022-12-01 DIAGNOSIS — B370 Candidal stomatitis: Secondary | ICD-10-CM

## 2022-12-01 DIAGNOSIS — K219 Gastro-esophageal reflux disease without esophagitis: Secondary | ICD-10-CM

## 2022-12-01 DIAGNOSIS — J4550 Severe persistent asthma, uncomplicated: Secondary | ICD-10-CM | POA: Diagnosis not present

## 2022-12-01 NOTE — Progress Notes (Signed)
Follow Up Note  RE: GLEN JOW MRN: [id number] DOB: [birth year] Date of Office Visit: 12/01/2022  Referring provider: [name], MD Primary care provider: [name], MD  Chief Complaint: No chief complaint on file.  History of Present Illness: I had the pleasure of seeing Lenorah Sobie for a follow up visit at the [facility] [place] on 12/01/2022. She is a 67 y.o. female, who is being followed for persistent asthma, atopic dermatitis, allergic rhinitis, reflux, oral candidiasis. Her previous allergy office visit was on 09/01/22 with Dr. Edison Pace. Today is a regular follow up visit.  History obtained from patient, chart review and  husband Christia Reading .  Today she reports her asthma has been well-controlled Dupixent injections.  Denies any side effects with Dupixent.  Has been able to keep her Advair 45 mcg 1 puff daily every other day.  She has a history of very persistent oral candidiasis and [employer] is to reduce inhaled corticosteroids.  She has not needed her albuterol at all since last visit.  She is very happy with asthma control.   Her eczema has improved since starting Dupixent.  Lesions on lower leg are slowly starting to heal.  She still has persistent xerosis and is only using emollients a few times per week  Her GERD is well-controlled.  She has been able to reduce her omeprazole to 20 mg once a day and has continued famotidine twice a day.   She does follow with GI.  She has had an increase in nasal congestion over the past few days.  She attributes this to increased pollen counts.  She restarted her Astelin and Flonase yesterday.  Request a sample nasal sinus rinses as this is helpful in the past.  She is continued her Singulair daily.  Not had any ocular symptoms not requiring Pataday eyedrops..    Assessment and Plan: Neasia is a 67 y.o. female with: Severe persistent asthma without complication - Plan: Spirometry with Graph  Intrinsic atopic  dermatitis  Other allergic rhinitis  Gastroesophageal reflux disease without esophagitis  Oral candidiasis Plan: Patient Instructions  Moderate persistent asthma:  well controlled  -Breathing test today showed:  looked great!!   PLAN:  -  Biologic:  Continue Dupixent 300mg  every 2 weeks  - Daily controller medication(s): Singulair 10mg  daily and Advair 25mcg 1 puff every other day as needed  - Prior to physical activity: albuterol 2 puffs 10-15 minutes before physical activity. - Rescue medications: albuterol 4 puffs every 4-6 hours as needed - Get Influenza Vaccine and appropriate Pneumonia and COVID 19 boosters  - Asthma control goals:  * Full participation in all desired activities (may need albuterol before activity) * Albuterol use two time or less a week on average (not counting use with activity) * Cough interfering with sleep two time or less a month * Oral steroids no more than once a year * No hospitalizations   Allergic Rhinitis: controlled  - Continue with:  Benadryl 25mg  at night, Singulair (montelukast) 10mg  daily, and Astelin (azelastine) 2 sprays per nostril 1-2 times daily  (can take astelin and flonase at the same time, Flonase 1  sprays per nostril twice daily  -Sinus rinses twice daily, used 10 to 50 minutes before nasal sprays - Pataday eye drops 1 drop per eye twice a day as needed    Atopic Dermatitis: Improved!  -Continue dupixent 300mg  every 2 weeks  - Skin is very dry, increase moisturizer to at least  twice a day which will help fully heal flares    Daily Care For Maintenance (daily and continue even once eczema controlled) - Recommend avoiding detergents, soaps or lotions with fragrances/dyes, and instead using products which are hypoallergenic, use second rinse cycle when washing clothes -Wear lose breathable clothing, avoid wool -Avoid extremes of humidity - Limit showers/baths to 5 minutes and use luke warm water instead of hot, pat dry following  baths, and apply moisturizer  For Flares:(add this to maintenance therapy if needed for flares) - Clobetasol 0.0.5% to body for severe flares-apply topically twice daily to red, raised, thickened areas of skin, followed by moisturizer   -Use for 5 days then step down to triamcinolone 0.1% ointment  - Triamcinolone 0.1% to body for moderate flares-apply topically twice daily to red, raised areas of skin, followed by moisturizer - Hydrocortisone 2.5% to face, armpit or groin-apply topically twice daily to red, raised areas of skin, followed by moisturizer    Reflux - Continue lifestyle and dietary modifications - Continue omeprazole 20 mg once a day and famotidine twice a day - Continue to follow with gastroenterology   Oral candidiasis: controlled no current flares  Follow up: 6 months   Thank you so much for letting me partake in your care today.  Don't hesitate to reach out if you have any additional concerns!  Roney Marion, MD  [facility] [place] [place]  No follow-ups on file.  No orders of the defined types were placed in this encounter.   Lab Orders  No laboratory test(s) ordered today   Diagnostics: Spirometry:  Tracings reviewed. Her effort: Good reproducible efforts. FVC: 2.76 L FEV1: 2.18 L, 88% predicted FEV1/FVC ratio: 78% Interpretation: Spirometry consistent with normal pattern.  Please see scanned spirometry results for details.   Results interpreted by myself during this encounter and discussed with patient/family.   Medication List:  Current Outpatient Medications  Medication Sig Dispense Refill   acetaminophen (TYLENOL) 500 MG tablet Take 500 mg by mouth every 6 (six) hours as needed for mild pain, moderate pain or headache.      ADVAIR HFA 45-21 MCG/ACT inhaler Inhale 2 puffs into the lungs 2 (two) times daily. 1 each 5   albuterol (VENTOLIN HFA) 108 (90 Base) MCG/ACT inhaler Two puffs every 4 hours if needed for wheezing or  coughing .  May use 2 puffs 5-15 minutes prior to exercise. 18 g 1   azelastine (ASTELIN) 0.1 % nasal spray Place 1-2 sprays into both nostrils 2 (two) times daily as needed. 30 mL 5   clobetasol ointment (TEMOVATE) [id number] % Apply 1 Application topically 2 (two) times daily. 30 g 0   famotidine (PEPCID) 20 MG tablet Take 1 tablet (20 mg total) by mouth 2 (two) times daily. 180 tablet 1   fluticasone-salmeterol (ADVAIR HFA) 115-21 MCG/ACT inhaler Inhale 1 puff into the lungs 2 (two) times daily.     levothyroxine (SYNTHROID) 88 MCG tablet TAKE ONE TABLET BY MOUTH DAILY 90 tablet 1   metFORMIN (GLUCOPHAGE) 500 MG tablet TAKE ONE TABLET BY MOUTH TWICE A DAY WITH MEALS 180 tablet 2   montelukast (SINGULAIR) 10 MG tablet TAKE ONE TABLET BY MOUTH AT BEDTIME 90 tablet 1   nystatin (MYCOSTATIN) 100000 UNIT/ML suspension Take 5 ml 4 times a day for 7 days. Retain in mouth as long as possible 150 mL 1   nystatin (MYCOSTATIN) 500000 units TABS tablet Take 1 tablet (500,000 Units total) by mouth 2 (two) times  daily. 30 tablet 0   omeprazole (PRILOSEC) 20 MG capsule TAKE ONE CAPSULE BY MOUTH TWICE A DAY BEFORE MEALS 180 capsule 1   OSTEO BI-FLEX REGULAR STRENGTH 250-200 MG TABS SMARTSIG:1 By Mouth     Probiotic Product (PROBIOTIC DAILY PO) Take 1 mg by mouth daily.     rosuvastatin (CRESTOR) 5 MG tablet TAKE ONE TABLET BY MOUTH DAILY 90 tablet 3   scopolamine (TRANSDERM-SCOP) 1 MG/3DAYS Place 1 patch (1.5 mg total) onto the skin every 3 (three) days. 10 patch 12   Spacer/Aero-Holding Chambers (AEROCHAMBER MV) inhaler Use as instructed 1 each 2   triamcinolone ointment (KENALOG) 0.1 % Apply 1 Application topically 2 (two) times daily. 30 g 0   Current Facility-Administered Medications  Medication Dose Route Frequency Provider Last Rate Last Admin   dupilumab (DUPIXENT) prefilled syringe 300 mg  300 mg Subcutaneous Q14 Days Roney Marion, MD   300 mg at 11/23/22 1035   Allergies: Allergies  Allergen  Reactions   Aspirin Other (See Comments)    Pt has a hx of TTP.    Nsaids Other (See Comments)    Pt has a hx of TTP.    I reviewed her past medical history, social history, family history, and environmental history and no significant changes have been reported from her previous visit.  ROS: All others negative except as noted per HPI.   Objective: BP 128/72 (BP Location: Right Arm, Patient Position: Sitting, Cuff Size: Large)   Pulse 76   Temp 98.2 F (36.8 C) (Temporal)   Resp 18   Ht 5\' 7"  (1.702 m)   Wt 252 lb (114.3 kg)   SpO2 98%   BMI 39.47 kg/m  Body mass index is 39.47 kg/m. General Appearance:  Alert, cooperative, no distress, appears stated age  Head:  Normocephalic, without obvious abnormality, atraumatic  Eyes:  Conjunctiva clear, EOM's intact  Nose: Nares normal,  slightly erythematous nasal mucosa slightly edematous, no visible anterior polyps, and septum midline  Throat: Lips, tongue normal; teeth and gums normal, normal posterior oropharynx  Neck: Supple, symmetrical  Lungs:   clear to auscultation bilaterally, Respirations unlabored, no coughing  Heart:  regular rate and rhythm and no murmur, Appears well perfused  Extremities: No edema  Skin: Skin color, texture, turgor normal, xerosis of bilateral lower legs   Neurologic: No gross deficits   Previous notes and tests were reviewed. The plan was reviewed with the patient/family, and all questions/concerned were addressed.  It was my pleasure to see Tara [name] today and participate in her care. Please feel free to contact me with any questions or concerns.  Sincerely,  Roney Marion, MD  Allergy & Immunology  [facility] [place] [facility] Office: [phone]

## 2022-12-01 NOTE — Patient Instructions (Addendum)
Moderate persistent asthma:  well controlled  -Breathing test today showed:  looked great!!   PLAN:  -  Biologic:  Continue Dupixent 300mg  every 2 weeks  - Daily controller medication(s): Singulair 10mg  daily and Advair 45mcg 1 puff every other day as needed  - Prior to physical activity: albuterol 2 puffs 10-15 minutes before physical activity. - Rescue medications: albuterol 4 puffs every 4-6 hours as needed - Get Influenza Vaccine and appropriate Pneumonia and COVID 19 boosters  - Asthma control goals:  * Full participation in all desired activities (may need albuterol before activity) * Albuterol use two time or less a week on average (not counting use with activity) * Cough interfering with sleep two time or less a month * Oral steroids no more than once a year * No hospitalizations   Allergic Rhinitis: controlled  - Continue with:  Benadryl 25mg  at night, Singulair (montelukast) 10mg  daily, and Astelin (azelastine) 2 sprays per nostril 1-2 times daily  (can take astelin and flonase at the same time, Flonase 1  sprays per nostril twice daily  -Sinus rinses twice daily, used 10 to 50 minutes before nasal sprays - Pataday eye drops 1 drop per eye twice a day as needed    Atopic Dermatitis: Improved!  -Continue dupixent 300mg  every 2 weeks  - Skin is very dry, increase moisturizer to at least twice a day which will help fully heal flares    Daily Care For Maintenance (daily and continue even once eczema controlled) - Recommend avoiding detergents, soaps or lotions with fragrances/dyes, and instead using products which are hypoallergenic, use second rinse cycle when washing clothes -Wear lose breathable clothing, avoid wool -Avoid extremes of humidity - Limit showers/baths to 5 minutes and use luke warm water instead of hot, pat dry following baths, and apply moisturizer  For Flares:(add this to maintenance therapy if needed for flares) - Clobetasol 0.0.5% to body for severe  flares-apply topically twice daily to red, raised, thickened areas of skin, followed by moisturizer   -Use for 5 days then step down to triamcinolone 0.1% ointment  - Triamcinolone 0.1% to body for moderate flares-apply topically twice daily to red, raised areas of skin, followed by moisturizer - Hydrocortisone 2.5% to face, armpit or groin-apply topically twice daily to red, raised areas of skin, followed by moisturizer    Reflux - Continue lifestyle and dietary modifications - Continue omeprazole 20 mg once a day and famotidine twice a day - Continue to follow with gastroenterology   Oral candidiasis: controlled no current flares  Follow up: 6 months   Thank you so much for letting me partake in your care today.  Don't hesitate to reach out if you have any additional concerns!  Roney Marion, MD  [facility] [place] [place]

## 2022-12-08 ENCOUNTER — Other Ambulatory Visit: Payer: Self-pay | Admitting: Internal Medicine

## 2022-12-09 ENCOUNTER — Ambulatory Visit (INDEPENDENT_AMBULATORY_CARE_PROVIDER_SITE_OTHER): Payer: Medicare Other | Admitting: *Deleted

## 2022-12-09 DIAGNOSIS — J4550 Severe persistent asthma, uncomplicated: Secondary | ICD-10-CM

## 2022-12-21 ENCOUNTER — Ambulatory Visit (INDEPENDENT_AMBULATORY_CARE_PROVIDER_SITE_OTHER): Payer: Medicare Other

## 2022-12-21 DIAGNOSIS — J4550 Severe persistent asthma, uncomplicated: Secondary | ICD-10-CM | POA: Diagnosis not present

## 2023-01-04 ENCOUNTER — Ambulatory Visit (INDEPENDENT_AMBULATORY_CARE_PROVIDER_SITE_OTHER): Payer: Medicare Other

## 2023-01-04 DIAGNOSIS — J4550 Severe persistent asthma, uncomplicated: Secondary | ICD-10-CM

## 2023-01-06 ENCOUNTER — Telehealth: Payer: Self-pay | Admitting: Internal Medicine

## 2023-01-06 NOTE — Telephone Encounter (Signed)
Tara [name] [phone]  Maelynne called to say she has a really bad sore throat where it hurts to swallow, drainage and a low grade fever, she wants to know if she can come in tomorrow to be seen.

## 2023-01-06 NOTE — Telephone Encounter (Signed)
scheduled

## 2023-01-07 ENCOUNTER — Ambulatory Visit (INDEPENDENT_AMBULATORY_CARE_PROVIDER_SITE_OTHER): Payer: Medicare Other | Admitting: Internal Medicine

## 2023-01-07 ENCOUNTER — Encounter: Payer: Self-pay | Admitting: Internal Medicine

## 2023-01-07 VITALS — BP 132/66 | HR 102 | Temp 100.1°F | Ht 67.0 in | Wt 252.0 lb

## 2023-01-07 DIAGNOSIS — J020 Streptococcal pharyngitis: Secondary | ICD-10-CM | POA: Diagnosis not present

## 2023-01-07 DIAGNOSIS — J029 Acute pharyngitis, unspecified: Secondary | ICD-10-CM | POA: Diagnosis not present

## 2023-01-07 LAB — POC COVID19 BINAXNOW: SARS Coronavirus 2 Ag: NEGATIVE

## 2023-01-07 LAB — POCT RAPID STREP A (OFFICE): Rapid Strep A Screen: POSITIVE — AB

## 2023-01-07 MED ORDER — AZITHROMYCIN 250 MG PO TABS: ORAL_TABLET | ORAL | 0 refills | Status: AC

## 2023-01-07 MED ORDER — HYDROCODONE BIT-HOMATROP MBR 5-1.5 MG/5ML PO SOLN: 5.0000 mL | Freq: Three times a day (TID) | ORAL | 0 refills | Status: DC | PRN

## 2023-01-07 NOTE — Patient Instructions (Signed)
Take Zithromax Z pak 2 tabs day 1 followed by one tab days 2-5. Hycodan ine tsp every 8 hours as needed for sore throat pain or cough. Rest and stay well hydrated. May take Tylenol for fever if needed.

## 2023-01-07 NOTE — Progress Notes (Signed)
Patient Care Team: [name], MD as PCP - General (Internal Medicine)  Visit Date: 01/07/23  Subjective:    Patient ID: Tara [name] , Female   DOB: March 08, 1956, 67 y.o.    MRN: [id number]   67 y.o. Female presents today for sore throat, yellow sinus drainage, fever since 01/06/23. Notes white bump under her tongue. She vomited once this morning after drinking water. Denies cough, phlegm production. Denies traveling recently.  Past Medical History:  Diagnosis Date   Allergy    Anxiety    Arthritis    Asthma    Back pain    Blood transfusion    Breast cancer (HCC) 2012   left breast   Chronic edema    Cold hands and feet    Complication of anesthesia    difficulty waking up/dizzy/lightheaded   Cough    Diabetes mellitus without complication (HCC)    on Metformin-    Dysrhythmia    irregular heartbeat, takes digoxin   Environmental allergies    Eye pain    Fatigue    GERD (gastroesophageal reflux disease)    occasionally    H/O varicella    H/O varicose veins    Hepatic cirrhosis (HCC)    Hepatitis C    History of chemotherapy 2013   left breast cancer   History of measles, mumps, or rubella    Hoarseness    Hx of radiation therapy 12/15/11 - 01/29/12   left breast   Hyperlipidemia    Hypothyroidism    Irregular heart beat    under control   Joint pain    Leg cramps    Neuromuscular disorder (HCC)    left foot nerve damage- neuropathy    Osteopenia    Palpitations    Pre-diabetes    Radiculopathy 07/2020   Rheumatoid arthritis (HCC)    Ringing in ear    Shortness of breath    Swallowing difficulty    Swelling of both lower extremities    Thrombocytopenia, primary (HCC)    TTP (thrombotic thrombocytopenic purpura) (HCC)      Family History  Problem Relation Age of Onset   Pneumonia Mother    COPD Mother    Colon polyps Mother    Heart disease Mother    Thyroid disease Mother    Obesity Mother    COPD Father    Obesity Father    Brain  cancer Maternal Grandfather    Alcohol abuse Maternal Grandfather    Hyperlipidemia Neg Hx    Hypertension Neg Hx    Colon cancer Neg Hx    Rectal cancer Neg Hx    Stomach cancer Neg Hx    Esophageal cancer Neg Hx     Social History   Social History Narrative   Not on file      Review of Systems  Constitutional:  Positive for fever (100.1 F) and malaise/fatigue.  HENT:  Positive for sore throat. Negative for congestion.        (+) Yellow sinus drainage  Eyes:  Negative for blurred vision.  Respiratory:  Negative for cough and shortness of breath.   Cardiovascular:  Negative for chest pain, palpitations and leg swelling.  Gastrointestinal:  Positive for vomiting.  Musculoskeletal:  Negative for back pain.  Skin:  Negative for rash.  Neurological:  Negative for loss of consciousness and headaches.        Objective:   Vitals: BP 132/66   Pulse (!) 102  Temp 100.1 F (37.8 C) (Tympanic)   Ht 5\' 7"  (1.702 m)   Wt 252 lb (114.3 kg)   SpO2 93%   BMI 39.47 kg/m    Physical Exam Vitals and nursing note reviewed.  Constitutional:      General: She is not in acute distress.    Appearance: Normal appearance. She is not toxic-appearing.  HENT:     Head: Normocephalic and atraumatic.     Mouth/Throat:     Comments: Pharynx injected without exudate. Aphthous ulcer under tongue. Pulmonary:     Effort: Pulmonary effort is normal. No respiratory distress.     Breath sounds: Normal breath sounds. No wheezing or rales.  Skin:    General: Skin is warm and dry.  Neurological:     Mental Status: She is alert and oriented to person, place, and time. Mental status is at baseline.  Psychiatric:        Mood and Affect: Mood normal.        Behavior: Behavior normal.        Thought Content: Thought content normal.        Judgment: Judgment normal.       Results:   Studies obtained and personally reviewed by me:    Labs:       Component Value Date/Time   NA 138  01/19/2022 0918   NA 137 10/10/2018 1347   NA 136 04/20/2017 1422   K 4.5 01/19/2022 0918   K 4.0 04/20/2017 1422   CL 100 01/19/2022 0918   CL 108 (H) 12/05/2012 0953   CO2 30 01/19/2022 0918   CO2 28 04/20/2017 1422   GLUCOSE 99 01/19/2022 0918   GLUCOSE 136 04/20/2017 1422   GLUCOSE 112 (H) 12/05/2012 0953   BUN 14 01/19/2022 0918   BUN 13 10/10/2018 1347   BUN 19.8 04/20/2017 1422   CREATININE 0.90 01/19/2022 0918   CREATININE 1.0 04/20/2017 1422   CALCIUM 8.8 01/19/2022 0918   CALCIUM 9.0 04/20/2017 1422   PROT 7.3 01/19/2022 0918   PROT 7.5 10/10/2018 1347   PROT 7.4 04/20/2017 1422   ALBUMIN 4.4 09/25/2020 1149   ALBUMIN 4.4 10/10/2018 1347   ALBUMIN 3.7 04/20/2017 1422   AST 14 01/19/2022 0918   AST 32 04/20/2017 1422   ALT 17 01/19/2022 0918   ALT 32 (H) 04/21/2017 1120   ALT 35 04/20/2017 1422   ALKPHOS 42 09/25/2020 1149   ALKPHOS 81 04/20/2017 1422   BILITOT 1.1 01/19/2022 0918   BILITOT 0.5 10/10/2018 1347   BILITOT 0.78 04/20/2017 1422   GFRNONAA 59 (L) 01/20/2021 0929   GFRAA 68 01/20/2021 0929     Lab Results  Component Value Date   WBC 4.2 01/19/2022   HGB 12.9 01/19/2022   HCT 38.7 01/19/2022   MCV 90.6 01/19/2022   PLT 144 01/19/2022    Lab Results  Component Value Date   CHOL 175 01/19/2022   HDL 73 01/19/2022   LDLCALC 79 01/19/2022   TRIG 133 01/19/2022   CHOLHDL 2.4 01/19/2022    Lab Results  Component Value Date   HGBA1C 5.6 01/19/2022     Lab Results  Component Value Date   TSH 1.95 01/19/2022      Assessment & Plan:   Acute streptococcal pharyngitis  Rapid strep screen is positive- prescribed Zithromax Z-Pak two tabs day 1 followed by one tab days 2-5, Hycodan syrup 1 tsp every 8 hours as needed for sore throat pain or cough. Contact us  if symptoms do not improve within 48 hours or sooner if worse.    I,Alexander Ruley,acting as a Neurosurgeon for [name], MD.,have documented all relevant documentation on the behalf  of [name], MD,as directed by  [name], MD while in the presence of [name], MD.   I, [name], MD, have reviewed all documentation for this visit. The documentation on 01/07/23 for the exam, diagnosis, procedures, and orders are all accurate and complete.

## 2023-01-18 ENCOUNTER — Ambulatory Visit (INDEPENDENT_AMBULATORY_CARE_PROVIDER_SITE_OTHER): Payer: Medicare Other

## 2023-01-18 DIAGNOSIS — J4550 Severe persistent asthma, uncomplicated: Secondary | ICD-10-CM

## 2023-01-26 LAB — HM MAMMOGRAPHY

## 2023-01-28 ENCOUNTER — Other Ambulatory Visit: Payer: Medicare Other

## 2023-01-29 ENCOUNTER — Ambulatory Visit: Payer: Medicare Other | Admitting: Internal Medicine

## 2023-02-01 ENCOUNTER — Encounter: Payer: Self-pay | Admitting: Internal Medicine

## 2023-02-01 NOTE — Progress Notes (Signed)
Patient Care Team: [name], MD as PCP - General (Internal Medicine)  Visit Date: 02/08/23  Subjective:    Patient ID: Tara [name] , Female   DOB: 01/17/1956, 67 y.o.    MRN: [id number]   67 y.o. Female presents today for a 6 month follow-up.   Notes tremor in hands. It was improving and now it is getting worse again. Worse when holding objects. No family history of tremor.  History of gastric polyps. Had endoscopy 2023. Recommended repeat in 2023.  Has been seen by Dr. Murray Hodgkins for back pain in the past and he is now retired. Interested in further epidurals.  She sees Allergist for moderate persistent asthma and allergic rhinitis treated with Ventolin inhaler, Advair inhaler, montelukast, dupilumab 300 mg every 14 days.  She has history of atopic dermatitis.  History of oral thrush and has been treated with nystatin orally.  History of dysrhythmia that was treated in the past with digoxin.  History of GERD treated with famotidine 20 mg twice daily, omeprazole 20 mg twice daily before meals.  History of hyperlipidemia treated with rosuvastatin 5 mg daily. Lipid panel normal.  History of displaced fracture of proximal phalanx right great toe noted April 2022 treated by podiatrist.  History of left thoracic radiculopathy which has improved.  Dr. Murray Hodgkins suggested a nerve block at T11 but I do not think patient had that procedure.  History of Hepatitis C treated with Harvoni, gastroparesis with delayed gastric emptying.  Patient thinks she contracted hepatitis C by blood transfusion for TTP while living in [place].  Liver functions are normal.  History of breast cancer while living in [place] in 2012.  Had a 10 mm suspicious abnormality on left mammogram and biopsy showing invasive ductal carcinoma grade 3 estrogen receptor positive/progesterone receptor positive/HER2 positive.  She had left lumpectomy and sentinel node sampling December 2012.  Sentinel nodes negative for tumor.   She has had 4 cycles of adjuvant chemotherapy.  Chemotherapy was started in [place] in 2013 after she moved here from [facility].  She also received radiation treatment here.  Developed cellulitis in her left breast at that time and was treated with Keflex.  No longer followed at cancer center.  History of impaired glucose tolerance. Labs pending.  Longstanding history of obesity and did try healthy weight clinic for a while but was not really committed.  History of TTP when she was 67 years of age and did not have splenectomy.  Patient says she had recurrence at age 58 while being treated with interferon for hepatitis C.  History of bilateral hip replacement, right hip arthroplasty 2005 and left hip arthroplasty 2007.  History of hypothyroidism treated with levothyroxine 88 mcg daily. TSH at 2.33.  History of eczema left foot.  History of dependent edema.  History of left lower lobe pneumonia January 2017 treated as an outpatient.  In July 2017 she was admitted to the hospital with hypokalemia thought to be secondary to diarrhea.  Presumably had viral gastroenteritis with more than 10 bowel movements daily.  Was treated with IV fluids and IV potassium.  Subsequently had a fall at home while she was sick with gastroenteritis and suffered a nondisplaced right posteriolateral eighth rib fracture.  In December 2016 she had CT of the chest with contrast for complaint of back and chest pain for 18 months study showed no evidence of metastatic disease.  History of vitamin D deficiency and I think she should continue high-dose vitamin D  indefinitely.  History of adenomatous colon polyp June 2020 and has been advised to have repeat colonoscopy.  In April 2022 she suffered a fall at the [facility] concert and fractured her right great toe.  Dr. Darnelle Catalan referred her to me in 2013 for primary care.  Glucose slightly elevated at 102. Kidney, liver functions normal. Electrolytes normal. Blood  proteins normal. MCHC low at 31.4. Platelets low at 115,000. Lymphs Abs low at 542. Vitamin D at 91. Taking Vitamin D 500 mg daily.   Social history: She is married.  She is a Futures trader.  Does not smoke.  Husband is a [employer]ar[employer] manager.  She drinks a glass of wine about twice weekly.  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   Past Medical History:  Diagnosis Date   Allergy    Anxiety    Arthritis    Asthma    Back pain    Blood transfusion    Breast cancer (HCC) 2012   left breast   Chronic edema    Cold hands and feet    Complication of anesthesia    difficulty waking up/dizzy/lightheaded   Cough    Diabetes mellitus without complication (HCC)    on Metformin-    Dysrhythmia    irregular heartbeat, takes digoxin   Environmental allergies    Eye pain    Fatigue    GERD (gastroesophageal reflux disease)    occasionally    H/O varicella    H/O varicose veins    Hepatic cirrhosis (HCC)    Hepatitis C    History of chemotherapy 2013   left breast cancer   History of measles, mumps, or rubella    Hoarseness    Hx of radiation therapy 12/15/11 - 01/29/12   left breast   Hyperlipidemia    Hypothyroidism    Irregular heart beat    under control   Joint pain    Leg cramps    Neuromuscular disorder (HCC)    left foot nerve damage- neuropathy    Osteopenia    Palpitations    Pre-diabetes    Radiculopathy 07/2020   Rheumatoid arthritis (HCC)    Ringing in ear    Shortness of breath    Swallowing difficulty    Swelling of both lower extremities    Thrombocytopenia, primary (HCC)    TTP (thrombotic thrombocytopenic purpura) (HCC)      Family History  Problem Relation Age of Onset   Pneumonia Mother    COPD Mother    Colon polyps Mother    Heart disease Mother    Thyroid disease Mother    Obesity Mother    COPD Father    Obesity Father    Brain cancer Maternal Grandfather    Alcohol abuse Maternal Grandfather    Hyperlipidemia Neg Hx     Hypertension Neg Hx    Colon cancer Neg Hx    Rectal cancer Neg Hx    Stomach cancer Neg Hx    Esophageal cancer Neg Hx     Social History   Social History Narrative   Not on file      Review of Systems  Constitutional:  Negative for fever and malaise/fatigue.  HENT:  Negative for congestion.   Eyes:  Negative for blurred vision.  Respiratory:  Negative for cough and shortness of breath.   Cardiovascular:  Negative for chest pain, palpitations and leg swelling.  Gastrointestinal:  Negative for vomiting.  Musculoskeletal:  Positive for  back pain.  Skin:  Negative for rash.  Neurological:  Positive for tremors (Hands). Negative for loss of consciousness and headaches.        Objective:   Vitals: BP 118/82   Pulse 67   Temp (!) 97.2 F (36.2 C) (Tympanic)   Resp 16   Ht 5\' 7"  (1.702 m)   Wt 253 lb 8 oz (115 kg)   SpO2 99%   BMI 39.70 kg/m    Physical Exam Vitals and nursing note reviewed.  Constitutional:      General: She is not in acute distress.    Appearance: Normal appearance. She is not toxic-appearing.  HENT:     Head: Normocephalic and atraumatic.  Cardiovascular:     Rate and Rhythm: Normal rate and regular rhythm. No extrasystoles are present.    Pulses: Normal pulses.     Heart sounds: Normal heart sounds. No murmur heard.    No friction rub. No gallop.     Comments: Multiple ectopic beats. Trace to 1+ edema. Pulmonary:     Effort: Pulmonary effort is normal. No respiratory distress.     Breath sounds: Normal breath sounds. No wheezing or rales.  Skin:    General: Skin is warm and dry.  Neurological:     Mental Status: She is alert and oriented to person, place, and time. Mental status is at baseline.  Psychiatric:        Mood and Affect: Mood normal.        Behavior: Behavior normal.        Thought Content: Thought content normal.        Judgment: Judgment normal.       Results:   Studies obtained and personally reviewed by  me:   Labs:       Component Value Date/Time   NA 141 02/05/2023 0925   NA 137 10/10/2018 1347   NA 136 04/20/2017 1422   K 4.7 02/05/2023 0925   K 4.0 04/20/2017 1422   CL 101 02/05/2023 0925   CL 108 (H) 12/05/2012 0953   CO2 29 02/05/2023 0925   CO2 28 04/20/2017 1422   GLUCOSE 102 (H) 02/05/2023 0925   GLUCOSE 136 04/20/2017 1422   GLUCOSE 112 (H) 12/05/2012 0953   BUN 8 02/05/2023 0925   BUN 13 10/10/2018 1347   BUN 19.8 04/20/2017 1422   CREATININE 0.90 02/05/2023 0925   CREATININE 1.0 04/20/2017 1422   CALCIUM 8.8 02/05/2023 0925   CALCIUM 9.0 04/20/2017 1422   PROT 7.1 02/05/2023 0925   PROT 7.5 10/10/2018 1347   PROT 7.4 04/20/2017 1422   ALBUMIN 4.4 09/25/2020 1149   ALBUMIN 4.4 10/10/2018 1347   ALBUMIN 3.7 04/20/2017 1422   AST 15 02/05/2023 0925   AST 32 04/20/2017 1422   ALT 15 02/05/2023 0925   ALT 32 (H) 04/21/2017 1120   ALT 35 04/20/2017 1422   ALKPHOS 42 09/25/2020 1149   ALKPHOS 81 04/20/2017 1422   BILITOT 1.0 02/05/2023 0925   BILITOT 0.5 10/10/2018 1347   BILITOT 0.78 04/20/2017 1422   GFRNONAA 59 (L) 01/20/2021 0929   GFRAA 68 01/20/2021 0929     Lab Results  Component Value Date   WBC 4.2 02/05/2023   HGB 13.0 02/05/2023   HCT 41.4 02/05/2023   MCV 93.2 02/05/2023   PLT 115 (L) 02/05/2023    Lab Results  Component Value Date   CHOL 186 02/05/2023   HDL 70 02/05/2023   LDLCALC 91 02/05/2023  TRIG 148 02/05/2023   CHOLHDL 2.7 02/05/2023    Lab Results  Component Value Date   HGBA1C 5.6 01/19/2022     Lab Results  Component Value Date   TSH 2.33 02/05/2023     Assessment & Plan:   Essential tremor hands: She is concerned about this. Neurology referral.  Back pain: seen by Dr. Murray Hodgkins for back pain who is now retired. Interested in further pain management injections. She will seek out  pain management specialist.She previously had one in [facility].  Moderate persistent asthma and allergic rhinitis: treated with  Ventolin inhaler, Advair inhaler, montelukast, dupilumab 300 mg every 14 days.  She has history of atopic dermatitis.  History of oral thrush and has been treated with nystatin orally.  Dysrhythmia: has taken digoxin in the past.Today has irregular pulse on exam. However EKG does not show a-fib have ordered 3 day zio monitor  GERD: treated with famotidine 20 mg twice daily, omeprazole 20 mg twice daily before meals.  Hyperlipidemia: treated with rosuvastatin 5 mg daily. Lipid panel normal.  Impaired glucose tolerance: Hgb AIC is not back from Lab.  Hypothyroidism: treated with levothyroxine 88 mcg daily. TSH at 2.33.  Vitamin D deficiency: Vitamin D at 4. Increase Vitamin D to 1000 mg daily.  Platelets slightly low: platelets at 115,000. Will continue to monitor.  Vaccine counseling: she will call with Shingles vaccine dates. Discussed RSV.  Return in 6 months for health maintenance exam or as needed.    I,Alexander Ruley,acting as a Neurosurgeon for [name], MD.,have documented all relevant documentation on the behalf of [name], MD,as directed by  [name], MD while in the presence of [name], MD.   I, [name], MD, have reviewed all documentation for this visit. The documentation on 02/08/23 for the exam, diagnosis, procedures, and orders are all accurate and complete.

## 2023-02-02 ENCOUNTER — Ambulatory Visit (INDEPENDENT_AMBULATORY_CARE_PROVIDER_SITE_OTHER): Payer: Medicare Other

## 2023-02-02 DIAGNOSIS — J4550 Severe persistent asthma, uncomplicated: Secondary | ICD-10-CM

## 2023-02-05 ENCOUNTER — Other Ambulatory Visit: Payer: Medicare Other

## 2023-02-05 DIAGNOSIS — F32A Depression, unspecified: Secondary | ICD-10-CM

## 2023-02-05 DIAGNOSIS — E1169 Type 2 diabetes mellitus with other specified complication: Secondary | ICD-10-CM

## 2023-02-05 DIAGNOSIS — F419 Anxiety disorder, unspecified: Secondary | ICD-10-CM

## 2023-02-05 DIAGNOSIS — Z0000 Encounter for general adult medical examination without abnormal findings: Secondary | ICD-10-CM

## 2023-02-05 DIAGNOSIS — E559 Vitamin D deficiency, unspecified: Secondary | ICD-10-CM

## 2023-02-05 DIAGNOSIS — E039 Hypothyroidism, unspecified: Secondary | ICD-10-CM

## 2023-02-06 LAB — COMPLETE METABOLIC PANEL WITH GFR
AST: 15 U/L (ref 10–35)
Calcium: 8.8 mg/dL (ref 8.6–10.4)
Potassium: 4.7 mmol/L (ref 3.5–5.3)

## 2023-02-06 LAB — LIPID PANEL
Cholesterol: 186 mg/dL (ref <200)
HDL: 70 mg/dL (ref >=50)
LDL Cholesterol (Calc): 91 mg/dL (calc)

## 2023-02-06 LAB — CBC WITH DIFFERENTIAL/PLATELET
Eosinophils Relative: 4.3 %
MCV: 93.2 fL (ref 80.0–100.0)
Monocytes Relative: 10.3 %
Platelets: 115 10*3/uL — ABNORMAL LOW (ref 140–400)
RBC: 4.44 10*6/uL (ref 3.80–5.10)
Total Lymphocyte: 12.9 %

## 2023-02-08 ENCOUNTER — Encounter: Payer: Self-pay | Admitting: Internal Medicine

## 2023-02-08 ENCOUNTER — Ambulatory Visit (INDEPENDENT_AMBULATORY_CARE_PROVIDER_SITE_OTHER): Payer: Medicare Other

## 2023-02-08 ENCOUNTER — Ambulatory Visit (INDEPENDENT_AMBULATORY_CARE_PROVIDER_SITE_OTHER): Payer: Medicare Other | Admitting: Internal Medicine

## 2023-02-08 VITALS — BP 118/82 | HR 67 | Temp 97.2°F | Resp 16 | Ht 67.0 in | Wt 253.5 lb

## 2023-02-08 DIAGNOSIS — I459 Conduction disorder, unspecified: Secondary | ICD-10-CM | POA: Diagnosis not present

## 2023-02-08 DIAGNOSIS — F419 Anxiety disorder, unspecified: Secondary | ICD-10-CM

## 2023-02-08 DIAGNOSIS — G250 Essential tremor: Secondary | ICD-10-CM

## 2023-02-08 DIAGNOSIS — M549 Dorsalgia, unspecified: Secondary | ICD-10-CM

## 2023-02-08 DIAGNOSIS — M47814 Spondylosis without myelopathy or radiculopathy, thoracic region: Secondary | ICD-10-CM | POA: Diagnosis not present

## 2023-02-08 DIAGNOSIS — M4804 Spinal stenosis, thoracic region: Secondary | ICD-10-CM

## 2023-02-08 DIAGNOSIS — R0609 Other forms of dyspnea: Secondary | ICD-10-CM

## 2023-02-08 DIAGNOSIS — Z853 Personal history of malignant neoplasm of breast: Secondary | ICD-10-CM

## 2023-02-08 DIAGNOSIS — J4520 Mild intermittent asthma, uncomplicated: Secondary | ICD-10-CM

## 2023-02-08 DIAGNOSIS — Z0000 Encounter for general adult medical examination without abnormal findings: Secondary | ICD-10-CM | POA: Diagnosis not present

## 2023-02-08 DIAGNOSIS — E039 Hypothyroidism, unspecified: Secondary | ICD-10-CM

## 2023-02-08 DIAGNOSIS — I499 Cardiac arrhythmia, unspecified: Secondary | ICD-10-CM

## 2023-02-08 DIAGNOSIS — F411 Generalized anxiety disorder: Secondary | ICD-10-CM

## 2023-02-08 DIAGNOSIS — Z6839 Body mass index (BMI) 39.0-39.9, adult: Secondary | ICD-10-CM

## 2023-02-08 DIAGNOSIS — M5414 Radiculopathy, thoracic region: Secondary | ICD-10-CM

## 2023-02-08 DIAGNOSIS — Z96643 Presence of artificial hip joint, bilateral: Secondary | ICD-10-CM

## 2023-02-08 LAB — POCT URINALYSIS DIPSTICK
Bilirubin, UA: NEGATIVE
Blood, UA: NEGATIVE
Glucose, UA: NEGATIVE
Ketones, UA: NEGATIVE
Leukocytes, UA: NEGATIVE
Nitrite, UA: NEGATIVE
Protein, UA: NEGATIVE
Spec Grav, UA: 1.01 (ref 1.010–1.025)
Urobilinogen, UA: 0.2 E.U./dL
pH, UA: 7.5 (ref 5.0–8.0)

## 2023-02-08 NOTE — Progress Notes (Addendum)
I connected with  Tara [name] on 02/10/23 by a audio enabled telemedicine application and verified that I am speaking with the correct person using two identifiers.  Patient Location: Home  Provider Location: Office/Clinic  I discussed the limitations of evaluation and management by telemedicine. The patient expressed understanding and agreed to proceed.  Subjective:   Tara [name] is a 67 y.o. female who presents for Medicare Annual (Subsequent) preventive examination.  Review of Systems    Defer to PCP Cardiac Risk Factors include: advanced age (>51men, >24 women);diabetes mellitus;dyslipidemia;sedentary lifestyle     Objective:    Today's Vitals   02/08/23 1359  PainSc: 0-No pain   There is no height or weight on file to calculate BMI.     02/08/2023    2:01 PM 01/27/2022   11:05 AM 05/11/2020    9:24 AM 01/10/2019   10:56 AM 03/18/2018    4:58 PM 01/22/2017   12:17 PM 08/18/2016    3:09 PM  Advanced Directives  Does Patient Have a Medical Advance Directive? Yes Yes Yes Yes No Yes Yes  Type of Estate agent of [place];Living will Living will  Living will  Living will Healthcare Power of Attorney  Does patient want to make changes to medical advance directive? No - Patient declined No - Patient declined       Copy of Healthcare Power of Attorney in Chart? No - copy requested        Would patient like information on creating a medical advance directive?     No - Patient declined      Current Medications (verified) Outpatient Encounter Medications as of 02/08/2023  Medication Sig   acetaminophen (TYLENOL) 500 MG tablet Take 500 mg by mouth every 6 (six) hours as needed for mild pain, moderate pain or headache.    ADVAIR HFA 45-21 MCG/ACT inhaler Inhale 2 puffs into the lungs 2 (two) times daily. (Patient not taking: Reported on 01/07/2023)   albuterol (VENTOLIN HFA) 108 (90 Base) MCG/ACT inhaler Two puffs every 4 hours if needed for wheezing or  coughing .  May use 2 puffs 5-15 minutes prior to exercise.   azelastine (ASTELIN) 0.1 % nasal spray Place 1-2 sprays into both nostrils 2 (two) times daily as needed.   clobetasol ointment (TEMOVATE) 0.05 % Apply 1 Application topically 2 (two) times daily.   famotidine (PEPCID) 20 MG tablet Take 1 tablet (20 mg total) by mouth 2 (two) times daily.   fluticasone-salmeterol (ADVAIR HFA) 115-21 MCG/ACT inhaler Inhale 1 puff into the lungs 2 (two) times daily. (Patient not taking: Reported on 01/07/2023)   levothyroxine (SYNTHROID) 88 MCG tablet TAKE 1 TABLET BY MOUTH DAILY   metFORMIN (GLUCOPHAGE) 500 MG tablet TAKE ONE TABLET BY MOUTH TWICE A DAY WITH MEALS   montelukast (SINGULAIR) 10 MG tablet TAKE ONE TABLET BY MOUTH AT BEDTIME   nystatin (MYCOSTATIN) 100000 UNIT/ML suspension Take 5 ml 4 times a day for 7 days. Retain in mouth as long as possible   nystatin (MYCOSTATIN) 500000 units TABS tablet Take 1 tablet (500,000 Units total) by mouth 2 (two) times daily.   omeprazole (PRILOSEC) 20 MG capsule TAKE ONE CAPSULE BY MOUTH TWICE A DAY BEFORE MEALS   OSTEO BI-FLEX REGULAR STRENGTH 250-200 MG TABS SMARTSIG:1 By Mouth   Probiotic Product (PROBIOTIC DAILY PO) Take 1 mg by mouth daily.   rosuvastatin (CRESTOR) 5 MG tablet TAKE ONE TABLET BY MOUTH DAILY   Spacer/Aero-Holding Chambers (AEROCHAMBER MV) inhaler Use  as instructed   triamcinolone ointment (KENALOG) 0.1 % Apply 1 Application topically 2 (two) times daily.   Facility-Administered Encounter Medications as of 02/08/2023  Medication   dupilumab (DUPIXENT) prefilled syringe 300 mg    Allergies (verified) Aspirin and Nsaids   History: Past Medical History:  Diagnosis Date   Allergy    Anxiety    Arthritis    Asthma    Back pain    Blood transfusion    Breast cancer (HCC) 2012   left breast   Chronic edema    Cold hands and feet    Complication of anesthesia    difficulty waking up/dizzy/lightheaded   Cough    Diabetes mellitus  without complication (HCC)    on Metformin-    Dysrhythmia    irregular heartbeat, takes digoxin   Environmental allergies    Eye pain    Fatigue    GERD (gastroesophageal reflux disease)    occasionally    H/O varicella    H/O varicose veins    Hepatic cirrhosis (HCC)    Hepatitis C    History of chemotherapy 2013   left breast cancer   History of measles, mumps, or rubella    Hoarseness    Hx of radiation therapy 12/15/11 - 01/29/12   left breast   Hyperlipidemia    Hypothyroidism    Irregular heart beat    under control   Joint pain    Leg cramps    Neuromuscular disorder (HCC)    left foot nerve damage- neuropathy    Osteopenia    Palpitations    Pre-diabetes    Radiculopathy 07/2020   Rheumatoid arthritis (HCC)    Ringing in ear    Shortness of breath    Swallowing difficulty    Swelling of both lower extremities    Thrombocytopenia, primary (HCC)    TTP (thrombotic thrombocytopenic purpura) (HCC)    Past Surgical History:  Procedure Laterality Date   BREAST LUMPECTOMY  08/03/11   left lumpectomy and slnbx,T1cN0,triple pos   CATARACT EXTRACTION, BILATERAL     COLONOSCOPY  01/26/2019   COLONOSCOPY  08/12/2020   elbow pins Left    FOOT SURGERY Left    multiple   POLYPECTOMY     PORTACATH PLACEMENT  08/03/2011   Procedure: INSERTION PORT-A-CATH;  Surgeon: Robyne Askew, MD;  Location: [facility] OR;  Service: General;  Laterality: Right;   portacath removal     TOTAL HIP ARTHROPLASTY Bilateral    UPPER GASTROINTESTINAL ENDOSCOPY     UPPER GASTROINTESTINAL ENDOSCOPY  08/12/2020   Family History  Problem Relation Age of Onset   Pneumonia Mother    COPD Mother    Colon polyps Mother    Heart disease Mother    Thyroid disease Mother    Obesity Mother    COPD Father    Obesity Father    Brain cancer Maternal Grandfather    Alcohol abuse Maternal Grandfather    Hyperlipidemia Neg Hx    Hypertension Neg Hx    Colon cancer Neg Hx    Rectal cancer Neg Hx     Stomach cancer Neg Hx    Esophageal cancer Neg Hx    Social History   Socioeconomic History   Marital status: Married    Spouse name: Tim   Number of children: 0   Years of education: Not on file   Highest education level: Not on file  Occupational History   Occupation: Retired  Tobacco Use  Smoking status: Never   Smokeless tobacco: Never  Vaping Use   Vaping Use: Never used  Substance and Sexual Activity   Alcohol use: Yes    Alcohol/week: 1.0 standard drink of alcohol    Types: 1 Glasses of wine per week    Comment: occ   Drug use: No   Sexual activity: Not on file  Other Topics Concern   Not on file  Social History Narrative   Not on file   Social Determinants of Health   Financial Resource Strain: Not on file  Food Insecurity: Not on file  Transportation Needs: Not on file  Physical Activity: Not on file  Stress: Not on file  Social Connections: Not on file    Tobacco Counseling Counseling given: Not Answered   Clinical Intake:  Pre-visit preparation completed: Yes  Pain : 0-10 Pain Score: 0-No pain     Nutritional Status: BMI 25 -29 Overweight Nutritional Risks: None Diabetes: No  How often do you need to have someone help you when you read instructions, pamphlets, or other written materials from your doctor or pharmacy?: 1 - Never  Diabetic?Nutrition Risk Assessment:  Has the patient had any N/V/D within the last 2 months?  No  Does the patient have any non-healing wounds?  No  Has the patient had any unintentional weight loss or weight gain?  No   Diabetes:  Is the patient diabetic?  Yes  If diabetic, was a CBG obtained today?  No  Did the patient bring in their glucometer from home?  No  How often do you monitor your CBG's? N/a.   Financial Strains and Diabetes Management:  Are you having any financial strains with the device, your supplies or your medication? No .  Does the patient want to be seen by Chronic Care Management for  management of their diabetes?  No  Would the patient like to be referred to a Nutritionist or for Diabetic Management?  No   Diabetic Exams:  Diabetic Eye Exam: Completed 01/2022    Interpreter Needed?: No  Information entered by :: [email or web address]   Activities of Daily Living    02/08/2023    2:02 PM 02/08/2023   10:17 AM  In your present state of health, do you have any difficulty performing the following activities:  Hearing? 0 0  Vision? 0 0  Difficulty concentrating or making decisions? 0 0  Walking or climbing stairs? 0   Dressing or bathing? 0 0  Doing errands, shopping? 0 0  Preparing Food and eating ? N N  Using the Toilet? N N  In the past six months, have you accidently leaked urine? N N  Do you have problems with loss of bowel control? N N  Managing your Medications? N N  Managing your Finances? N N  Housekeeping or managing your Housekeeping? N N    Patient Care Team: [name], MD as PCP - General (Internal Medicine) Shon Millet, MD as Consulting Physician (Ophthalmology)  Indicate any recent Medical Services you may have received from other than [facility] providers in the past year (date may be approximate).     Assessment:   This is a routine wellness examination for [facility].  Hearing/Vision screen No results found.  Dietary issues and exercise activities discussed: Current Exercise Habits: Structured exercise class, Type of exercise: strength training/weights;Other - see comments, Time (Minutes): 60, Frequency (Times/Week): 2, Weekly Exercise (Minutes/Week): 120, Intensity: Mild, Exercise limited by: orthopedic condition(s)   Goals Addressed  None   Depression Screen    02/08/2023    2:01 PM 01/07/2023    2:31 PM 08/13/2022   11:38 AM 01/27/2022   11:05 AM 01/23/2021   11:08 AM 05/27/2020   11:59 AM 05/17/2020   12:15 PM  PHQ 2/9 Scores  PHQ - 2 Score 0 0 0 0 0 0 0  PHQ- 9 Score      0     Fall Risk    02/08/2023    2:01 PM 02/08/2023   10:18 AM  01/07/2023    2:31 PM 08/13/2022   11:38 AM 01/27/2022   11:05 AM  Fall Risk   Falls in the past year? 0 0 0 0 0  Number falls in past yr: 0 0 0 0 0  Injury with Fall? 0 0  0 0  Risk for fall due to : No Fall Risks No Fall Risks No Fall Risks No Fall Risks   Follow up   Falls prevention discussed Falls prevention discussed Falls evaluation completed    FALL RISK PREVENTION PERTAINING TO THE HOME:  Any stairs in or around the home? No  If so, are there any without handrails? No  Home free of loose throw rugs in walkways, pet beds, electrical cords, etc? Yes  Adequate lighting in your home to reduce risk of falls? Yes   ASSISTIVE DEVICES UTILIZED TO PREVENT FALLS:  Life alert? No  Use of a cane, walker or w/c? No  Grab bars in the bathroom? No  Shower chair or bench in shower? yes Elevated toilet seat or a handicapped toilet? No   TIMED UP AND GO:  Was the test performed? No .  Length of time to ambulate 10 feet: n/a sec.     Cognitive Function:        02/08/2023    2:02 PM 01/27/2022   11:06 AM  6CIT Screen  What Year? 0 points 0 points  What month? 0 points 0 points  What time? 0 points 0 points  Count back from 20 0 points 0 points  Months in reverse 0 points   Repeat phrase 0 points   Total Score 0 points     Immunizations Immunization History  Administered Date(s) Administered   DTaP 05/17/2007   Fluad Quad(high Dose 65+) 06/02/2019, 05/14/2022   Hep A / Hep B 06/14/2018, 07/15/2018   Hepatitis A, Adult 09/17/2021   Hepb-cpg 09/17/2021, 10/20/2021   Influenza Split 05/31/2012   Influenza Whole 07/14/2011   Influenza, Quadrivalent, Recombinant, Inj, Pf 05/05/2019   Influenza,inj,Quad PF,6+ Mos 06/06/2013, 06/25/2014, 07/01/2015, 07/02/2016, 05/17/2020   Influenza-Unspecified 05/25/2017, 03/31/2018, 06/09/2021   PFIZER(Purple Top)SARS-COV-2 Vaccination 09/27/2019, 10/11/2019, 05/21/2020, 12/13/2020   Pneumococcal Conjugate-13 08/01/2015, 01/23/2020    Pneumococcal Polysaccharide-23 04/25/2013, 01/23/2021   Tdap 09/05/2015    TDAP status: Up to date  Flu Vaccine status: Up to date  Pneumococcal vaccine status: Up to date  Covid-19 vaccine status: Declined, Education has been provided regarding the importance of this vaccine but patient still declined. Advised may receive this vaccine at local pharmacy or Health Dept.or vaccine clinic. Aware to provide a copy of the vaccination record if obtained from local pharmacy or Health Dept. Verbalized acceptance and understanding.  Qualifies for Shingles Vaccine? Yes   Zostavax completed Yes   Shingrix Completed?: Yes  Screening Tests Health Maintenance  Topic Date Due   Zoster Vaccines- Shingrix (1 of 2) Never done   HEMOGLOBIN A1C  07/22/2022   FOOT EXAM  01/28/2023  OPHTHALMOLOGY EXAM  07/10/2023 (Originally 05/01/2022)   INFLUENZA VACCINE  04/01/2023   Colonoscopy  08/13/2023   MAMMOGRAM  01/26/2024   Diabetic kidney evaluation - eGFR measurement  02/05/2024   Diabetic kidney evaluation - Urine ACR  02/05/2024   Medicare Annual Wellness (AWV)  02/08/2024   DTaP/Tdap/Td (3 - Td or Tdap) 09/04/2025   Pneumonia Vaccine 67+ Years old  Completed   DEXA SCAN  Completed   Hepatitis C Screening  Completed   HPV VACCINES  Aged Out   COVID-19 Vaccine  Discontinued    Health Maintenance  Health Maintenance Due  Topic Date Due   Zoster Vaccines- Shingrix (1 of 2) Never done   HEMOGLOBIN A1C  07/22/2022   FOOT EXAM  01/28/2023    Colorectal cancer screening: Type of screening: Colonoscopy. Completed 07/2020. Repeat every 10 years  Mammogram status: Completed 01/26/23. Repeat every year  Bone Density status: Completed 01/19/2022. Results reflect: Bone density results: NORMAL. Repeat every 2 years.  Lung Cancer Screening: (Low Dose CT Chest recommended if Age 39-80 years, 30 pack-year currently smoking OR have quit w/in 15years.) does not qualify.   Lung Cancer Screening Referral:    Additional Screening:  Hepatitis C Screening: does qualify; Completed 12/2021  Vision Screening: Recommended annual ophthalmology exams for early detection of glaucoma and other disorders of the eye. Is the patient up to date with their annual eye exam?  Yes  Who is the provider or what is the name of the office in which the patient attends annual eye exams? [facility]- Dr Algernon Huxley If pt is not established with a provider, would they like to be referred to a provider to establish care?  N/a .   Dental Screening: Recommended annual dental exams for proper oral hygiene  Community Resource Referral / Chronic Care Management: CRR required this visit?  No   CCM required this visit?  No      Plan:     I have personally reviewed and noted the following in the patient's chart:   Medical and social history Use of alcohol, tobacco or illicit drugs  Current medications and supplements including opioid prescriptions. Patient is not currently taking opioid prescriptions. Functional ability and status Nutritional status Physical activity Advanced directives List of other physicians Hospitalizations, surgeries, and ER visits in previous 12 months Vitals Screenings to include cognitive, depression, and falls Referrals and appointments  In addition, I have reviewed and discussed with patient certain preventive protocols, quality metrics, and best practice recommendations. A written personalized care plan for preventive services as well as general preventive health recommendations were provided to patient.     Jama Flavors, CMA   02/08/2023   Nurse Notes: Non face to face 15 minutes.    IMargaree Mackintosh, MD, have reviewed all documentation for this visit. The documentation on 02/08/23 for the exam, diagnosis, procedures, and orders are all accurate and complete.

## 2023-02-08 NOTE — Patient Instructions (Addendum)
Order 3 day monitor from [facility] for irregular pulse with history of dyspnea on exertion. Referral to Neurology for new essential tremor. Hgb AIC is still pending from lab.

## 2023-02-08 NOTE — Patient Instructions (Signed)
  Tara [name] , Thank you for taking time to come for your Medicare Wellness Visit. I appreciate your ongoing commitment to your health goals. Please review the following plan we discussed and let me know if I can assist you in the future.   These are the goals we discussed:  Goals   None     This is a list of the screening recommended for you and due dates:  Health Maintenance  Topic Date Due   Zoster (Shingles) Vaccine (1 of 2) Never done   Hemoglobin A1C  07/22/2022   Complete foot exam   01/28/2023   Eye exam for diabetics  07/10/2023*   Flu Shot  04/01/2023   Colon Cancer Screening  08/13/2023   Mammogram  01/26/2024   Yearly kidney function blood test for diabetes  02/05/2024   Yearly kidney health urinalysis for diabetes  02/05/2024   Medicare Annual Wellness Visit  02/08/2024   DTaP/Tdap/Td vaccine (3 - Td or Tdap) 09/04/2025   Pneumonia Vaccine  Completed   DEXA scan (bone density measurement)  Completed   Hepatitis C Screening  Completed   HPV Vaccine  Aged Out   COVID-19 Vaccine  Discontinued  *Topic was postponed. The date shown is not the original due date.

## 2023-02-09 ENCOUNTER — Encounter: Payer: Self-pay | Admitting: Internal Medicine

## 2023-02-09 LAB — CBC WITH DIFFERENTIAL/PLATELET
Absolute Monocytes: 433 cells/uL (ref 200–950)
Basophils Absolute: 29 cells/uL (ref 0–200)
Basophils Relative: 0.7 %
Eosinophils Absolute: 181 cells/uL (ref 15–500)
HCT: 41.4 % (ref 35.0–45.0)
Hemoglobin: 13 g/dL (ref 11.7–15.5)
Lymphs Abs: 542 cells/uL — ABNORMAL LOW (ref 850–3900)
MCH: 29.3 pg (ref 27.0–33.0)
MCHC: 31.4 g/dL — ABNORMAL LOW (ref 32.0–36.0)
MPV: 11.8 fL (ref 7.5–12.5)
Neutro Abs: 3016 cells/uL (ref 1500–7800)
Neutrophils Relative %: 71.8 %
RDW: 14.4 % (ref 11.0–15.0)
WBC: 4.2 10*3/uL (ref 3.8–10.8)

## 2023-02-09 LAB — LIPID PANEL
Non-HDL Cholesterol (Calc): 116 mg/dL (calc) (ref <130)
Total CHOL/HDL Ratio: 2.7 (calc) (ref <5.0)
Triglycerides: 148 mg/dL (ref <150)

## 2023-02-09 LAB — COMPLETE METABOLIC PANEL WITH GFR
AG Ratio: 1.5 (calc) (ref 1.0–2.5)
ALT: 15 U/L (ref 6–29)
Albumin: 4.3 g/dL (ref 3.6–5.1)
Alkaline phosphatase (APISO): 44 U/L (ref 37–153)
BUN: 8 mg/dL (ref 7–25)
CO2: 29 mmol/L (ref 20–32)
Chloride: 101 mmol/L (ref 98–110)
Creat: 0.9 mg/dL (ref 0.50–1.05)
Globulin: 2.8 g/dL (calc) (ref 1.9–3.7)
Glucose, Bld: 102 mg/dL — ABNORMAL HIGH (ref 65–99)
Sodium: 141 mmol/L (ref 135–146)
Total Bilirubin: 1 mg/dL (ref 0.2–1.2)
Total Protein: 7.1 g/dL (ref 6.1–8.1)
eGFR: 70 mL/min/{1.73_m2} (ref >=60)

## 2023-02-09 LAB — TSH: TSH: 2.33 mIU/L (ref 0.40–4.50)

## 2023-02-09 LAB — HEMOGLOBIN A1C
Hgb A1c MFr Bld: 5.9 % of total Hgb — ABNORMAL HIGH (ref <5.7)
Mean Plasma Glucose: 123 mg/dL
eAG (mmol/L): 6.8 mmol/L

## 2023-02-09 LAB — VITAMIN D 25 HYDROXY (VIT D DEFICIENCY, FRACTURES): Vit D, 25-Hydroxy: 39 ng/mL (ref 30–100)

## 2023-02-09 LAB — MICROALBUMIN / CREATININE URINE RATIO
Creatinine, Urine: 70 mg/dL (ref 20–275)
Microalb Creat Ratio: 13 mg/g creat (ref <30)
Microalb, Ur: 0.9 mg/dL

## 2023-02-10 ENCOUNTER — Telehealth: Payer: Self-pay | Admitting: Internal Medicine

## 2023-02-10 NOTE — Telephone Encounter (Signed)
Tara [name] called to say she got her RSV injection today and the pharmacist said you could go out to the [employer] site to see when she got her Shingles vaccines. She was also inquiring if she need the PNA vaccine I let her know the date of when she got her last one PNA 23 on 01/23/21

## 2023-02-11 NOTE — Telephone Encounter (Signed)
[facility] immunizations updated in chart

## 2023-02-12 ENCOUNTER — Other Ambulatory Visit: Payer: Self-pay | Admitting: Nurse Practitioner

## 2023-02-12 DIAGNOSIS — K7400 Hepatic fibrosis, unspecified: Secondary | ICD-10-CM

## 2023-02-15 ENCOUNTER — Ambulatory Visit (INDEPENDENT_AMBULATORY_CARE_PROVIDER_SITE_OTHER): Payer: Medicare Other

## 2023-02-15 DIAGNOSIS — J4550 Severe persistent asthma, uncomplicated: Secondary | ICD-10-CM

## 2023-03-08 ENCOUNTER — Other Ambulatory Visit: Payer: Self-pay

## 2023-03-08 MED ORDER — METFORMIN HCL 500 MG PO TABS: 500.0000 mg | ORAL_TABLET | Freq: Two times a day (BID) | ORAL | 2 refills | Status: DC

## 2023-03-08 MED ORDER — LEVOTHYROXINE SODIUM 88 MCG PO TABS: 88.0000 ug | ORAL_TABLET | Freq: Every day | ORAL | 0 refills | Status: DC

## 2023-03-09 ENCOUNTER — Ambulatory Visit (INDEPENDENT_AMBULATORY_CARE_PROVIDER_SITE_OTHER): Payer: Medicare Other

## 2023-03-09 DIAGNOSIS — J4550 Severe persistent asthma, uncomplicated: Secondary | ICD-10-CM | POA: Diagnosis not present

## 2023-03-13 ENCOUNTER — Other Ambulatory Visit: Payer: Self-pay | Admitting: Nurse Practitioner

## 2023-03-13 DIAGNOSIS — K7400 Hepatic fibrosis, unspecified: Secondary | ICD-10-CM

## 2023-03-14 ENCOUNTER — Other Ambulatory Visit: Payer: Self-pay | Admitting: Internal Medicine

## 2023-03-23 ENCOUNTER — Ambulatory Visit (INDEPENDENT_AMBULATORY_CARE_PROVIDER_SITE_OTHER): Payer: Medicare Other

## 2023-03-23 DIAGNOSIS — J4550 Severe persistent asthma, uncomplicated: Secondary | ICD-10-CM | POA: Diagnosis not present

## 2023-03-25 ENCOUNTER — Telehealth: Payer: Self-pay

## 2023-03-25 NOTE — Telephone Encounter (Signed)
Results have been relayed to the patient/authorized caretaker. The patient/authorized caretaker verbalized understanding. No questions at this time.  Patient asked for appt for back pain but is receiving shots through neurosurgery. She has been advised to reach out to them first to see what they say since they are treating the pain.

## 2023-03-25 NOTE — Telephone Encounter (Signed)
Patient called in and states that although she had a shot  with neurosurgery she is still having lots of pain and wanted to know if she could use voltaren gel. Please advise.

## 2023-03-29 ENCOUNTER — Other Ambulatory Visit: Payer: Self-pay | Admitting: Nurse Practitioner

## 2023-03-29 DIAGNOSIS — K7400 Hepatic fibrosis, unspecified: Secondary | ICD-10-CM

## 2023-04-06 ENCOUNTER — Ambulatory Visit (INDEPENDENT_AMBULATORY_CARE_PROVIDER_SITE_OTHER): Payer: Medicare Other

## 2023-04-06 DIAGNOSIS — J4550 Severe persistent asthma, uncomplicated: Secondary | ICD-10-CM | POA: Diagnosis not present

## 2023-04-13 ENCOUNTER — Other Ambulatory Visit (HOSPITAL_BASED_OUTPATIENT_CLINIC_OR_DEPARTMENT_OTHER): Payer: Self-pay | Admitting: Student

## 2023-04-13 DIAGNOSIS — M4804 Spinal stenosis, thoracic region: Secondary | ICD-10-CM

## 2023-04-18 ENCOUNTER — Ambulatory Visit (HOSPITAL_BASED_OUTPATIENT_CLINIC_OR_DEPARTMENT_OTHER)
Admission: RE | Admit: 2023-04-18 | Discharge: 2023-04-18 | Disposition: A | Discharge status: Home / Self Care | Payer: Medicare Other | Source: Ambulatory Visit | Attending: Student | Admitting: Student

## 2023-04-18 DIAGNOSIS — M4804 Spinal stenosis, thoracic region: Secondary | ICD-10-CM | POA: Diagnosis not present

## 2023-04-19 ENCOUNTER — Telehealth: Payer: Self-pay | Admitting: Internal Medicine

## 2023-04-19 NOTE — Telephone Encounter (Signed)
I can see order, working on finding out why it has not been processed.

## 2023-04-19 NOTE — Telephone Encounter (Signed)
Tara [name] [phone]  Tara [name] called to check on a bill I was working on for her today and she also asked me about the heart monitor that was ordered for her when she was last here. I looked back in the notes from 02/08/2023 and seen where you said to order her a 3 day zio monitor and that was never done. Do you want it done now?

## 2023-04-20 ENCOUNTER — Encounter: Payer: Self-pay | Admitting: Internal Medicine

## 2023-04-20 ENCOUNTER — Ambulatory Visit: Payer: Medicare Other | Attending: Internal Medicine

## 2023-04-20 ENCOUNTER — Ambulatory Visit (INDEPENDENT_AMBULATORY_CARE_PROVIDER_SITE_OTHER): Payer: Medicare Other

## 2023-04-20 DIAGNOSIS — J4550 Severe persistent asthma, uncomplicated: Secondary | ICD-10-CM

## 2023-04-20 NOTE — Telephone Encounter (Signed)
Talked with [facility] and the order was put in as regular instead of future so it got arrived and looked like it was completed. She has fixed it and will be sending out the monitor to [facility].

## 2023-04-20 NOTE — Progress Notes (Unsigned)
Enrolled for [employer] to mail a ZIO XT long term holter monitor to the patients address on file.   Order unlinked from 02/08/23 OV and corrected.  DOD to read.

## 2023-04-21 ENCOUNTER — Telehealth (HOSPITAL_COMMUNITY): Payer: Self-pay | Admitting: Cardiology

## 2023-04-21 NOTE — Telephone Encounter (Signed)
Pt called triage with questions regarding zio. Pt wears external TENS unit and would like to know if its ok to use while wearing zio.  ( 14 day Zio ordered by pcp and directed to contact cardiology)   Per [employer] [place] -it is NOT RECOMMENDED to wear tens unit while wearing a zio. Any date recorded while patients wears a tens may not be useable. -if the ordering physician still wants the patient to wear a tens unit, please have them note the date and time it was applied so zio can account for any changes in the EKG readings.    Pt aware of above and voiced understanding

## 2023-04-30 ENCOUNTER — Other Ambulatory Visit: Payer: Self-pay | Admitting: Nurse Practitioner

## 2023-04-30 DIAGNOSIS — K7400 Hepatic fibrosis, unspecified: Secondary | ICD-10-CM

## 2023-05-05 ENCOUNTER — Ambulatory Visit (INDEPENDENT_AMBULATORY_CARE_PROVIDER_SITE_OTHER): Payer: Medicare Other | Admitting: Internal Medicine

## 2023-05-05 ENCOUNTER — Ambulatory Visit: Payer: Medicare Other

## 2023-05-05 ENCOUNTER — Encounter: Payer: Self-pay | Admitting: Internal Medicine

## 2023-05-05 VITALS — BP 130/90 | HR 85 | Temp 97.3°F | Resp 16

## 2023-05-05 DIAGNOSIS — J4550 Severe persistent asthma, uncomplicated: Secondary | ICD-10-CM

## 2023-05-05 DIAGNOSIS — R932 Abnormal findings on diagnostic imaging of liver and biliary tract: Secondary | ICD-10-CM

## 2023-05-05 DIAGNOSIS — K7400 Hepatic fibrosis, unspecified: Secondary | ICD-10-CM

## 2023-05-05 DIAGNOSIS — K7030 Alcoholic cirrhosis of liver without ascites: Secondary | ICD-10-CM

## 2023-05-05 MED ORDER — FAMOTIDINE 20 MG PO TABS: ORAL_TABLET | ORAL | 1 refills | Status: DC

## 2023-05-05 NOTE — Patient Instructions (Addendum)
Moderate persistent asthma:  well controlled  -Breathing test today showed:  looked great!!   PLAN:  -  Biologic:  We will switch to fasenra to lesson the visits for injections  - Daily controller medication(s): Singulair 10mg  daily and Advair 1 puff every other day as needed  - Prior to physical activity: albuterol 2 puffs 10-15 minutes before physical activity. - Rescue medications: albuterol 4 puffs every 4-6 hours as needed - Get Influenza Vaccine and appropriate Pneumonia and COVID 19 boosters  - Asthma control goals:  * Full participation in all desired activities (may need albuterol before activity) * Albuterol use two time or less a week on average (not counting use with activity) * Cough interfering with sleep two time or less a month * Oral steroids no more than once a year * No hospitalizations   Allergic Rhinitis: controlled  - Continue with:  Benadryl 25mg  at night, Singulair (montelukast) 10mg  daily, and Astelin (azelastine) 2 sprays per nostril 1-2 times daily  (can take astelin and flonase at the same time, Flonase 1  sprays per nostril twice daily  -Sinus rinses twice daily, used 10 to 50 minutes before nasal sprays - Pataday eye drops 1 drop per eye twice a day as needed    Atopic Dermatitis:  Daily Care For Maintenance (daily and continue even once eczema controlled) - Recommend avoiding detergents, soaps or lotions with fragrances/dyes, and instead using products which are hypoallergenic, use second rinse cycle when washing clothes -Wear lose breathable clothing, avoid wool -Avoid extremes of humidity - Limit showers/baths to 5 minutes and use luke warm water instead of hot, pat dry following baths, and apply moisturizer  For Flares:(add this to maintenance therapy if needed for flares) - Clobetasol 0.0.5% to body for severe flares-apply topically twice daily to red, raised, thickened areas of skin, followed by moisturizer   -Use for 5 days then step down to  triamcinolone 0.1% ointment  - Triamcinolone 0.1% to body for moderate flares-apply topically twice daily to red, raised areas of skin, followed by moisturizer - Hydrocortisone 2.5% to face, armpit or groin-apply topically twice daily to red, raised areas of skin, followed by moisturizer    Reflux - Continue lifestyle and dietary modifications - Continue famotidine twice a day - Continue to follow with gastroenterology   Follow up: 6 months   Thank you so much for letting me partake in your care today.  Don't hesitate to reach out if you have any additional concerns!  Ferol Luz, MD  [facility] [place], [place]

## 2023-05-05 NOTE — Progress Notes (Signed)
Follow Up Note  RE: Tara [name] MRN: [id number] DOB: [birth year] Date of Office Visit: 05/05/2023  Referring provider: [name], MD Primary care provider: [name], MD  Chief Complaint: Asthma  History of Present Illness: I had the pleasure of seeing Tara [name] for a follow up visit at the [facility] on 05/07/2023. She is a 67 y.o. female, who is being followed for persistent asthma, atopic dermatitis, allergic rhinitis, reflux, oral candidiasis. Her previous allergy office visit was on 12/01/22 with Dr. Marlynn Perking. Today is a regular follow up visit.  History obtained from patient, chart review and  husband Tara [name] .  At last visit; FEV1: 2.18 L, 88% predicted  Today she reports her asthma has been well-controlled Dupixent injections.  Denies any side effects with Dupixent.  Has been able to keep her Advair 45 mcg 1 puff daily every other day.  She has a history of very persistent oral candidiasis and goal of Biologics is to reduce inhaled corticosteroids.  She has not needed her albuterol at all since last visit.  She is very happy with asthma control. She hasn't had any thrush.     Her eczema hasnt really improved with dupixent and it has been hard coming in every 2 weeks.  She would like to switch back to fasenra.    Her GERD has recurred.  She has been on or off both omeprazole and famotidine.  Needs refills.   Nasal symptoms are controlled with flonase, astelin, benadryl, singulair.  Denies any breakthrough congestion, rhinnorhea, PND.    She has had increasing back pain and has been placed on hydrocodone.  She is upset about her back pain and tearful during visit.     Assessment and Plan: Tara [name] is a 67 y.o. female with: Severe persistent asthma, unspecified whether complicated - Plan: Spirometry with Graph  Alcoholic cirrhosis of liver without ascites (HCC)  Abnormal finding on imaging of liver  Liver fibrosis Plan: Patient Instructions   Moderate persistent asthma:  well controlled  -Breathing test today showed:  looked great!!   PLAN:  -  Biologic:  We will switch to fasenra to lesson the visits for injections  - Daily controller medication(s): Singulair 10mg  daily and Advair 1 puff every other day as needed  - Prior to physical activity: albuterol 2 puffs 10-15 minutes before physical activity. - Rescue medications: albuterol 4 puffs every 4-6 hours as needed - Get Influenza Vaccine and appropriate Pneumonia and COVID 19 boosters  - Asthma control goals:  * Full participation in all desired activities (may need albuterol before activity) * Albuterol use two time or less a week on average (not counting use with activity) * Cough interfering with sleep two time or less a month * Oral steroids no more than once a year * No hospitalizations   Allergic Rhinitis: controlled  - Continue with:  Benadryl 25mg  at night, Singulair (montelukast) 10mg  daily, and Astelin (azelastine) 2 sprays per nostril 1-2 times daily  (can take astelin and flonase at the same time, Flonase 1  sprays per nostril twice daily  -Sinus rinses twice daily, used 10 to 50 minutes before nasal sprays - Pataday eye drops 1 drop per eye twice a day as needed    Atopic Dermatitis:  Daily Care For Maintenance (daily and continue even once eczema controlled) - Recommend avoiding detergents, soaps or lotions with fragrances/dyes, and instead using products which are hypoallergenic, use second rinse cycle when washing  clothes -Wear lose breathable clothing, avoid wool -Avoid extremes of humidity - Limit showers/baths to 5 minutes and use luke warm water instead of hot, pat dry following baths, and apply moisturizer  For Flares:(add this to maintenance therapy if needed for flares) - Clobetasol 0.0.5% to body for severe flares-apply topically twice daily to red, raised, thickened areas of skin, followed by moisturizer   -Use for 5 days then step down to  triamcinolone 0.1% ointment  - Triamcinolone 0.1% to body for moderate flares-apply topically twice daily to red, raised areas of skin, followed by moisturizer - Hydrocortisone 2.5% to face, armpit or groin-apply topically twice daily to red, raised areas of skin, followed by moisturizer    Reflux - Continue lifestyle and dietary modifications - Continue famotidine twice a day - Continue to follow with gastroenterology   Follow up: 6 months   Thank you so much for letting me partake in your care today.  Don't hesitate to reach out if you have any additional concerns!  Tara Luz, MD  [facility] [place]  No follow-ups on file.  Meds ordered this encounter  Medications   famotidine (PEPCID) 20 MG tablet    Sig: Take 1 tablet two times a day.  Patient needs follow up appointment for future refills. Please call [phone] to schedule an appointment.    Dispense:  112 tablet    Refill:  1    Please dispense 90 day supply.    Lab Orders  No laboratory test(s) ordered today   Diagnostics: Spirometry:  Tracings reviewed. Her effort: Good reproducible efforts. FVC: 2.46 L FEV1: 2.00 L, 80% predicted FEV1/FVC ratio: 81% Interpretation: Spirometry consistent with normal pattern.  Please see scanned spirometry results for details.   Results interpreted by myself during this encounter and discussed with patient/family.   Medication List:  Current Outpatient Medications  Medication Sig Dispense Refill   acetaminophen (TYLENOL) 500 MG tablet Take 500 mg by mouth every 6 (six) hours as needed for mild pain, moderate pain or headache.      ADVAIR HFA 45-21 MCG/ACT inhaler Inhale 2 puffs into the lungs 2 (two) times daily. 1 each 5   albuterol (VENTOLIN HFA) 108 (90 Base) MCG/ACT inhaler Two puffs every 4 hours if needed for wheezing or coughing .  May use 2 puffs 5-15 minutes prior to exercise. 18 g 1   azelastine (ASTELIN) 0.1 % nasal spray Place 1-2  sprays into both nostrils 2 (two) times daily as needed. 30 mL 5   clobetasol ointment (TEMOVATE) 0.05 % Apply 1 Application topically 2 (two) times daily. 30 g 0   fluticasone-salmeterol (ADVAIR HFA) 115-21 MCG/ACT inhaler Inhale 1 puff into the lungs 2 (two) times daily.     levothyroxine (SYNTHROID) 88 MCG tablet Take 1 tablet (88 mcg total) by mouth daily. 90 tablet 0   metFORMIN (GLUCOPHAGE) 500 MG tablet Take 1 tablet (500 mg total) by mouth 2 (two) times daily with a meal. 180 tablet 2   montelukast (SINGULAIR) 10 MG tablet TAKE 1 TABLET BY MOUTH AT BEDTIME 90 tablet 1   nystatin (MYCOSTATIN) 100000 UNIT/ML suspension Take 5 ml 4 times a day for 7 days. Retain in mouth as long as possible 150 mL 1   nystatin (MYCOSTATIN) 500000 units TABS tablet Take 1 tablet (500,000 Units total) by mouth 2 (two) times daily. 30 tablet 0   omeprazole (PRILOSEC) 20 MG capsule TAKE ONE CAPSULE BY MOUTH TWICE A DAY BEFORE MEALS 180  capsule 1   OSTEO BI-FLEX REGULAR STRENGTH 250-200 MG TABS SMARTSIG:1 By Mouth     oxyCODONE (OXY IR/ROXICODONE) 5 MG immediate release tablet Take by mouth.     Probiotic Product (PROBIOTIC DAILY PO) Take 1 mg by mouth daily.     rosuvastatin (CRESTOR) 5 MG tablet TAKE ONE TABLET BY MOUTH DAILY 90 tablet 3   Spacer/Aero-Holding Chambers (AEROCHAMBER MV) inhaler Use as instructed 1 each 2   triamcinolone ointment (KENALOG) 0.1 % Apply 1 Application topically 2 (two) times daily. 30 g 0   famotidine (PEPCID) 20 MG tablet Take 1 tablet two times a day.  Patient needs follow up appointment for future refills. Please call [phone] to schedule an appointment. 112 tablet 1   Current Facility-Administered Medications  Medication Dose Route Frequency Provider Last Rate Last Admin   dupilumab (DUPIXENT) prefilled syringe 300 mg  300 mg Subcutaneous Q14 Days Tara Luz, MD   300 mg at 05/05/23 1118   Allergies: Allergies  Allergen Reactions   Aspirin Other (See Comments)     Pt has a hx of TTP.    Nsaids Other (See Comments)    Pt has a hx of TTP.    I reviewed her past medical history, social history, family history, and environmental history and no significant changes have been reported from her previous visit.  ROS: All others negative except as noted per HPI.   Objective: BP (!) 130/90 (BP Location: Right Arm, Patient Position: Sitting, Cuff Size: Large)   Pulse 85   Temp (!) 97.3 F (36.3 C) (Temporal)   Resp 16   SpO2 96%  There is no height or weight on file to calculate BMI. General Appearance:  Alert, cooperative, tearful , appears stated age  Head:  Normocephalic, without obvious abnormality, atraumatic  Eyes:  Conjunctiva clear, EOM's intact  Nose: Nares normal, normal mucosa, no visible anterior polyps, and septum midline  Throat: Lips, tongue normal; teeth and gums normal, normal posterior oropharynx  Neck: Supple, symmetrical  Lungs:   clear to auscultation bilaterally, Respirations unlabored, no coughing  Heart:  regular rate and rhythm and no murmur, Appears well perfused  Extremities: No edema  Skin: Skin color, texture, turgor normal, xerosis of bilateral lower legs   Neurologic: No gross deficits   Previous notes and tests were reviewed. The plan was reviewed with the patient/family, and all questions/concerned were addressed.  It was my pleasure to see Tara [name] today and participate in her care. Please feel free to contact me with any questions or concerns.  Sincerely,  Tara Luz, MD  Allergy & Immunology  [facility] Office: [phone]

## 2023-05-07 DIAGNOSIS — R0609 Other forms of dyspnea: Secondary | ICD-10-CM | POA: Diagnosis not present

## 2023-05-07 DIAGNOSIS — I499 Cardiac arrhythmia, unspecified: Secondary | ICD-10-CM

## 2023-05-13 ENCOUNTER — Other Ambulatory Visit: Payer: Self-pay | Admitting: Neurosurgery

## 2023-05-19 ENCOUNTER — Ambulatory Visit (INDEPENDENT_AMBULATORY_CARE_PROVIDER_SITE_OTHER): Payer: Medicare Other

## 2023-05-19 DIAGNOSIS — J4550 Severe persistent asthma, uncomplicated: Secondary | ICD-10-CM

## 2023-05-21 NOTE — Pre-Procedure Instructions (Addendum)
Surgical Instructions    Your procedure is scheduled on Tuesday, October 1st.  Report to [facility] Main Entrance "A" at 6:00 A.M., then check in with the Admitting office.  Call this number if you have problems the morning of surgery:  [phone]  If you have any questions prior to your surgery date call [phone]: Open Monday-Friday 8am-4pm If you experience any cold or flu symptoms such as cough, fever, chills, shortness of breath, etc. between now and your scheduled surgery, please notify us at the above number.     Remember:  Do not eat or drink after midnight the night before your surgery    Take these medicines the morning of surgery with A SIP OF WATER  famotidine (PEPCID)  levothyroxine (SYNTHROID)  montelukast (SINGULAIR)  rosuvastatin (CRESTOR)    Take these medications as needed: acetaminophen (TYLENOL)  ADVAIR HFA 45-21 MCG/ACT inhaler-bring inhaler with you to the hospital. albuterol (VENTOLIN HFA) 108 (90 Base) MCG/ACT inhaler  azelastine (ASTELIN) 0.1 % nasal spray  gabapentin (NEURONTIN)  oxyCODONE (OXY IR/ROXICODONE)  Polyethyl Glycol-Propyl Glycol (SYSTANE)   As of today, STOP taking any Aspirin (unless otherwise instructed by your surgeon) Aleve, Naproxen, Ibuprofen, Motrin, Advil, Goody's, BC's, all herbal medications, fish oil, and all vitamins.  WHAT DO I DO ABOUT MY DIABETES MEDICATION?   Do not take metFORMIN (GLUCOPHAGE) the morning of surgery.   HOW TO MANAGE YOUR DIABETES BEFORE AND AFTER SURGERY  Why is it important to control my blood sugar before and after surgery? Improving blood sugar levels before and after surgery helps healing and can limit problems. A way of improving blood sugar control is eating a healthy diet by:  Eating less sugar and carbohydrates  Increasing activity/exercise  Talking with your doctor about reaching your blood sugar goals High blood sugars (greater than 180 mg/dL) can raise your risk of infections and slow  your recovery, so you will need to focus on controlling your diabetes during the weeks before surgery. Make sure that the doctor who takes care of your diabetes knows about your planned surgery including the date and location.  How do I manage my blood sugar before surgery? Check your blood sugar at least 4 times a day, starting 2 days before surgery, to make sure that the level is not too high or low.  Check your blood sugar the morning of your surgery when you wake up and every 2 hours until you get to the Short Stay unit.  If your blood sugar is less than 70 mg/dL, you will need to treat for low blood sugar: Do not take insulin. Treat a low blood sugar (less than 70 mg/dL) with  cup of clear juice (cranberry or apple), 4 glucose tablets, OR glucose gel. Recheck blood sugar in 15 minutes after treatment (to make sure it is greater than 70 mg/dL). If your blood sugar is not greater than 70 mg/dL on recheck, call [phone] for further instructions. Report your blood sugar to the short stay nurse when you get to Short Stay.  If you are admitted to the hospital after surgery: Your blood sugar will be checked by the staff and you will probably be given insulin after surgery (instead of oral diabetes medicines) to make sure you have good blood sugar levels. The goal for blood sugar control after surgery is 80-180 mg/dL.                      Do NOT Smoke (Tobacco/Vaping) for 24  hours prior to your procedure.  If you use a CPAP at night, you may bring your mask/headgear for your overnight stay.   Contacts, glasses, piercing's, hearing aid's, dentures or partials may not be worn into surgery, please bring cases for these belongings.    For patients admitted to the hospital, discharge time will be determined by your treatment team.   Patients discharged the day of surgery will not be allowed to drive home, and someone needs to stay with them for 24 hours.  SURGICAL WAITING ROOM  VISITATION Patients having surgery or a procedure may have no more than 2 support people in the waiting area - these visitors may rotate.   Children under the age of 71 must have an adult with them who is not the patient. If the patient needs to stay at the hospital during part of their recovery, the visitor guidelines for inpatient rooms apply. Pre-op nurse will coordinate an appropriate time for 1 support person to accompany patient in pre-op.  This support person may not rotate.   Please refer to the [facility] website for the visitor guidelines for Inpatients (after your surgery is over and you are in a regular room).    Special instructions:     Pre-operative 5 CHG Bath Instructions   You can play a key role in reducing the risk of infection after surgery. Your skin needs to be as free of germs as possible. You can reduce the number of germs on your skin by washing with CHG (chlorhexidine gluconate) soap before surgery. CHG is an antiseptic soap that kills germs and continues to kill germs even after washing.   DO NOT use if you have an allergy to chlorhexidine/CHG or antibacterial soaps. If your skin becomes reddened or irritated, stop using the CHG and notify one of our RNs at [phone].   Please shower with the CHG soap starting 4 days before surgery using the following schedule:     Please keep in mind the following:  DO NOT shave, including legs and underarms, starting the day of your first shower.   You may shave your face at any point before/day of surgery.  Place clean sheets on your bed the day you start using CHG soap. Use a clean washcloth (not used since being washed) for each shower. DO NOT sleep with pets once you start using the CHG.   CHG Shower Instructions:  If you choose to wash your hair and private area, wash first with your normal shampoo/soap.  After you use shampoo/soap, rinse your hair and body thoroughly to remove shampoo/soap residue.  Turn the water  OFF and apply about 3 tablespoons (45 ml) of CHG soap to a CLEAN washcloth.  Apply CHG soap ONLY FROM YOUR NECK DOWN TO YOUR TOES (washing for 3-5 minutes)  DO NOT use CHG soap on face, private areas, open wounds, or sores.  Pay special attention to the area where your surgery is being performed.  If you are having back surgery, having someone wash your back for you may be helpful. Wait 2 minutes after CHG soap is applied, then you may rinse off the CHG soap.  Pat dry with a clean towel  Put on clean clothes/pajamas   If you choose to wear lotion, please use ONLY the CHG-compatible lotions on the back of this paper.     Additional instructions for the day of surgery: DO NOT APPLY any lotions, deodorants, cologne, or perfumes.   No nail polish or toenail polish.  Put on clean/comfortable clothes.  Brush your teeth.  Ask your nurse before applying any prescription medications to the skin.      CHG Compatible Lotions   Aveeno Moisturizing lotion  Cetaphil Moisturizing Cream  Cetaphil Moisturizing Lotion  Clairol Herbal Essence Moisturizing Lotion, Dry Skin  Clairol Herbal Essence Moisturizing Lotion, Extra Dry Skin  Clairol Herbal Essence Moisturizing Lotion, Normal Skin  Curel Age Defying Therapeutic Moisturizing Lotion with Alpha Hydroxy  Curel Extreme Care Body Lotion  Curel Soothing Hands Moisturizing Hand Lotion  Curel Therapeutic Moisturizing Cream, Fragrance-Free  Curel Therapeutic Moisturizing Lotion, Fragrance-Free  Curel Therapeutic Moisturizing Lotion, Original Formula  Eucerin Daily Replenishing Lotion  Eucerin Dry Skin Therapy Plus Alpha Hydroxy Crme  Eucerin Dry Skin Therapy Plus Alpha Hydroxy Lotion  Eucerin Original Crme  Eucerin Original Lotion  Eucerin Plus Crme Eucerin Plus Lotion  Eucerin TriLipid Replenishing Lotion  Keri Anti-Bacterial Hand Lotion  Keri Deep Conditioning Original Lotion Dry Skin Formula Softly Scented  Keri Deep Conditioning Original  Lotion, Fragrance Free Sensitive Skin Formula  Keri Lotion Fast Absorbing Fragrance Free Sensitive Skin Formula  Keri Lotion Fast Absorbing Softly Scented Dry Skin Formula  Keri Original Lotion  Keri Skin Renewal Lotion Keri Silky Smooth Lotion  Keri Silky Smooth Sensitive Skin Lotion  Nivea Body Creamy Conditioning Oil  Nivea Body Extra Enriched Lotion  Nivea Body Original Lotion  Nivea Body Sheer Moisturizing Lotion Nivea Crme  Nivea Skin Firming Lotion  NutraDerm 30 Skin Lotion  NutraDerm Skin Lotion  NutraDerm Therapeutic Skin Cream  NutraDerm Therapeutic Skin Lotion  ProShield Protective Hand Cream  Provon moisturizing lotion    Please read over the following fact sheets that you were given.    If you received a COVID test during your pre-op visit  it is requested that you wear a mask when out in public, stay away from anyone that may not be feeling well and notify your surgeon if you develop symptoms. If you have been in contact with anyone that has tested positive in the last 10 days please notify you surgeon.

## 2023-05-24 ENCOUNTER — Encounter: Payer: Self-pay | Admitting: Internal Medicine

## 2023-05-24 ENCOUNTER — Ambulatory Visit (INDEPENDENT_AMBULATORY_CARE_PROVIDER_SITE_OTHER): Payer: Medicare Other | Admitting: Internal Medicine

## 2023-05-24 ENCOUNTER — Encounter (HOSPITAL_COMMUNITY): Payer: Self-pay

## 2023-05-24 ENCOUNTER — Other Ambulatory Visit: Payer: Self-pay

## 2023-05-24 ENCOUNTER — Encounter (HOSPITAL_COMMUNITY)
Admission: RE | Admit: 2023-05-24 | Discharge: 2023-05-24 | Disposition: A | Discharge status: Home / Self Care | Payer: Medicare Other | Source: Ambulatory Visit | Attending: Neurosurgery | Admitting: Neurosurgery

## 2023-05-24 ENCOUNTER — Telehealth: Payer: Self-pay | Admitting: Internal Medicine

## 2023-05-24 VITALS — BP 110/88 | HR 110 | Temp 98.0°F | Ht 67.0 in | Wt 255.0 lb

## 2023-05-24 VITALS — BP 143/85 | HR 85 | Temp 98.3°F | Resp 18 | Ht 66.5 in | Wt 255.6 lb

## 2023-05-24 DIAGNOSIS — E119 Type 2 diabetes mellitus without complications: Secondary | ICD-10-CM | POA: Diagnosis not present

## 2023-05-24 DIAGNOSIS — Z8619 Personal history of other infectious and parasitic diseases: Secondary | ICD-10-CM | POA: Diagnosis not present

## 2023-05-24 DIAGNOSIS — J069 Acute upper respiratory infection, unspecified: Secondary | ICD-10-CM

## 2023-05-24 DIAGNOSIS — Z01818 Encounter for other preprocedural examination: Secondary | ICD-10-CM | POA: Diagnosis present

## 2023-05-24 DIAGNOSIS — M1990 Unspecified osteoarthritis, unspecified site: Secondary | ICD-10-CM | POA: Insufficient documentation

## 2023-05-24 LAB — CBC
HCT: 40.8 % (ref 36.0–46.0)
Hemoglobin: 13.1 g/dL (ref 12.0–15.0)
MCH: 30.7 pg (ref 26.0–34.0)
MCHC: 32.1 g/dL (ref 30.0–36.0)
MCV: 95.6 fL (ref 80.0–100.0)
Platelets: 153 10*3/uL (ref 150–400)
RBC: 4.27 MIL/uL (ref 3.87–5.11)
RDW: 14 % (ref 11.5–15.5)
WBC: 4.6 10*3/uL (ref 4.0–10.5)
nRBC: 0 % (ref 0.0–0.2)

## 2023-05-24 LAB — SURGICAL PCR SCREEN
MRSA, PCR: NEGATIVE
Staphylococcus aureus: NEGATIVE

## 2023-05-24 LAB — COMPREHENSIVE METABOLIC PANEL
ALT: 18 U/L (ref 0–44)
AST: 21 U/L (ref 15–41)
Albumin: 3.8 g/dL (ref 3.5–5.0)
Alkaline Phosphatase: 42 U/L (ref 38–126)
Anion gap: 11 (ref 5–15)
BUN: 10 mg/dL (ref 8–23)
CO2: 23 mmol/L (ref 22–32)
Calcium: 8.1 mg/dL — ABNORMAL LOW (ref 8.9–10.3)
Chloride: 103 mmol/L (ref 98–111)
Creatinine, Ser: 0.99 mg/dL (ref 0.44–1.00)
GFR, Estimated: >60 mL/min (ref >60)
Glucose, Bld: 86 mg/dL (ref 70–99)
Potassium: 4.2 mmol/L (ref 3.5–5.1)
Sodium: 137 mmol/L (ref 135–145)
Total Bilirubin: 0.9 mg/dL (ref 0.3–1.2)
Total Protein: 7.4 g/dL (ref 6.5–8.1)

## 2023-05-24 LAB — HEMOGLOBIN A1C
Hgb A1c MFr Bld: 5.5 % (ref 4.8–5.6)
Mean Plasma Glucose: 111.15 mg/dL

## 2023-05-24 LAB — GLUCOSE, CAPILLARY: Glucose-Capillary: 111 mg/dL — ABNORMAL HIGH (ref 70–99)

## 2023-05-24 MED ORDER — AZITHROMYCIN 250 MG PO TABS: ORAL_TABLET | ORAL | 0 refills | Status: AC

## 2023-05-24 MED ORDER — BENZONATATE 100 MG PO CAPS: 100.0000 mg | ORAL_CAPSULE | Freq: Three times a day (TID) | ORAL | 0 refills | Status: DC | PRN

## 2023-05-24 NOTE — Progress Notes (Signed)
PCP - Dr. Lenord Fellers Cardiologist - denies  Chest x-ray - n/a EKG - 02/08/23 Stress Test - denies ECHO - 2014 Cardiac Cath - denies   Sleep Study - denies CPAP - denies  Fasting Blood Sugar - reports she only considers herself a prediabetic--doesn't check sugar at home. Takes Metformin daily.   Blood Thinner Instructions: None Aspirin Instructions:  ERAS Protcol - NPO PRE-SURGERY Ensure or G2-   COVID TEST- n/a.  Pt does endorse increased sinus tenderness and yellow mucous. Feels like a sinus infection is coming on. Instructed pt to contact PCP to make aware and to contact surgeon. Nurse reached out to anesthesia PA-C: no need to obtain Covid test today, reach out to PCP regarding sinus infection   Anesthesia review: Hx TTP  Patient denies shortness of breath, fever, cough and chest pain at PAT appointment

## 2023-05-24 NOTE — Progress Notes (Signed)
Patient Care Team: [name], MD as PCP - General (Internal Medicine) Shon Millet, MD as Consulting Physician (Ophthalmology)  Visit Date: 05/24/23  Subjective:    Patient ID: Tara [name] , Female   DOB: 06-15-1956, 67 y.o.    MRN: [id number]   67 y.o. Female presents today for sinus pressure, yellow post nasal drip recently. Denies unusual cough, fever, chills, sore throat, headache. History of chronic cough, asthma. Reports negative at-home Covid-19 test.  Pending laminectomy and foraminotomy on 06/01/23 with neurosurgeon, Dr. Julio Sicks. She mailed her heart monitor 1.5 weeks ago.  Past Medical History:  Diagnosis Date   Allergy    Anxiety    Arthritis    Asthma    Back pain    Blood transfusion    Breast cancer (HCC) 2012   left breast   Chronic edema    Cold hands and feet    Complication of anesthesia    difficulty waking up/dizzy/lightheaded   Cough    Diabetes mellitus without complication (HCC)    on Metformin-    Dysrhythmia    irregular heartbeat, takes digoxin   Environmental allergies    Eye pain    Fatigue    GERD (gastroesophageal reflux disease)    occasionally    H/O varicella    H/O varicose veins    Hepatic cirrhosis (HCC)    Hepatitis C    History of chemotherapy 2013   left breast cancer   History of measles, mumps, or rubella    Hoarseness    Hx of radiation therapy 12/15/11 - 01/29/12   left breast   Hyperlipidemia    Hypothyroidism    Irregular heart beat    under control   Joint pain    Leg cramps    Neuromuscular disorder (HCC)    left foot nerve damage- neuropathy    Osteopenia    Palpitations    Pre-diabetes    Radiculopathy 07/2020   Rheumatoid arthritis (HCC)    Ringing in ear    Shortness of breath    Swallowing difficulty    Swelling of both lower extremities    Thrombocytopenia, primary (HCC)    TTP (thrombotic thrombocytopenic purpura) (HCC)    1982     Family History  Problem Relation Age of Onset    Pneumonia Mother    COPD Mother    Colon polyps Mother    Heart disease Mother    Thyroid disease Mother    Obesity Mother    COPD Father    Obesity Father    Brain cancer Maternal Grandfather    Alcohol abuse Maternal Grandfather    Hyperlipidemia Neg Hx    Hypertension Neg Hx    Colon cancer Neg Hx    Rectal cancer Neg Hx    Stomach cancer Neg Hx    Esophageal cancer Neg Hx     Social Hx: unchanged. Married lives with husband.     Review of Systems  Constitutional:  Negative for fever and malaise/fatigue.  HENT:  Positive for sinus pain (Pressure). Negative for congestion.        (+) Yellow post nasal drip  Eyes:  Negative for blurred vision.  Respiratory:  Negative for cough and shortness of breath.   Cardiovascular:  Negative for chest pain, palpitations and leg swelling.  Gastrointestinal:  Negative for vomiting.  Musculoskeletal:  Negative for back pain.  Skin:  Negative for rash.  Neurological:  Negative for loss of consciousness  and headaches.        Objective:   Vitals: BP 110/88   Pulse (!) 110   Temp 98 F (36.7 C)   Ht 5\' 7"  (1.702 m)   Wt 255 lb (115.7 kg)   SpO2 96%   BMI 39.94 kg/m    Physical Exam Vitals and nursing note reviewed.  Constitutional:      General: She is not in acute distress.    Appearance: Normal appearance. She is not toxic-appearing.  HENT:     Head: Normocephalic and atraumatic.     Right Ear: Hearing, tympanic membrane, ear canal and external ear normal.     Left Ear: Hearing, tympanic membrane, ear canal and external ear normal.     Mouth/Throat:     Comments: Pharynx slightly injected without exudate. Pulmonary:     Effort: Pulmonary effort is normal. No respiratory distress.     Breath sounds: Normal breath sounds. No wheezing or rales.  Skin:    General: Skin is warm and dry.  Neurological:     Mental Status: She is alert and oriented to person, place, and time. Mental status is at baseline.  Psychiatric:         Mood and Affect: Mood normal.        Behavior: Behavior normal.        Thought Content: Thought content normal.        Judgment: Judgment normal.       Results:   Studies obtained and personally reviewed by me:   Labs:       Component Value Date/Time   NA 137 05/24/2023 1351   NA 137 10/10/2018 1347   NA 136 04/20/2017 1422   K 4.2 05/24/2023 1351   K 4.0 04/20/2017 1422   CL 103 05/24/2023 1351   CL 108 (H) 12/05/2012 0953   CO2 23 05/24/2023 1351   CO2 28 04/20/2017 1422   GLUCOSE 86 05/24/2023 1351   GLUCOSE 136 04/20/2017 1422   GLUCOSE 112 (H) 12/05/2012 0953   BUN 10 05/24/2023 1351   BUN 13 10/10/2018 1347   BUN 19.8 04/20/2017 1422   CREATININE 0.99 05/24/2023 1351   CREATININE 0.90 02/05/2023 0925   CREATININE 1.0 04/20/2017 1422   CALCIUM 8.1 (L) 05/24/2023 1351   CALCIUM 9.0 04/20/2017 1422   PROT 7.4 05/24/2023 1351   PROT 7.5 10/10/2018 1347   PROT 7.4 04/20/2017 1422   ALBUMIN 3.8 05/24/2023 1351   ALBUMIN 4.4 10/10/2018 1347   ALBUMIN 3.7 04/20/2017 1422   AST 21 05/24/2023 1351   AST 32 04/20/2017 1422   ALT 18 05/24/2023 1351   ALT 32 (H) 04/21/2017 1120   ALT 35 04/20/2017 1422   ALKPHOS 42 05/24/2023 1351   ALKPHOS 81 04/20/2017 1422   BILITOT 0.9 05/24/2023 1351   BILITOT 0.5 10/10/2018 1347   BILITOT 0.78 04/20/2017 1422   GFRNONAA >60 05/24/2023 1351   GFRNONAA 59 (L) 01/20/2021 0929   GFRAA 68 01/20/2021 0929     Lab Results  Component Value Date   WBC 4.6 05/24/2023   HGB 13.1 05/24/2023   HCT 40.8 05/24/2023   MCV 95.6 05/24/2023   PLT 153 05/24/2023    Lab Results  Component Value Date   CHOL 186 02/05/2023   HDL 70 02/05/2023   LDLCALC 91 02/05/2023   TRIG 148 02/05/2023   CHOLHDL 2.7 02/05/2023    Lab Results  Component Value Date   HGBA1C 5.5 05/24/2023  Lab Results  Component Value Date   TSH 2.33 02/05/2023      Assessment & Plan:   Acute upper respiratory infection: prescribed Z-Pak two  tabs day 1 followed by one tab days 2-5, Tessalon 100 mg three times daily as needed for cough. Get plenty of rest and stay well-hydrated. Contact us if symptoms worsen or fail to improve.    I,Alexander Ruley,acting as a Neurosurgeon for [name], MD.,have documented all relevant documentation on the behalf of [name], MD,as directed by  [name], MD while in the presence of [name], MD.   I, [name], MD, have reviewed all documentation for this visit. The documentation on 05/25/23 for the exam, diagnosis, procedures, and orders are all accurate and complete.

## 2023-05-24 NOTE — Telephone Encounter (Signed)
Patient called back, COVID test is negative. We will see her at 4:30pm.

## 2023-05-24 NOTE — Telephone Encounter (Signed)
Tara [name] [phone]  Maisen called to say she started getting sick yesterday with runny nose, sinus pressure and yellow mucus, and she always has a cough. I have ask her to do a COVID test and call me Back. She has surgery scheduled for 06/01/2023 and wanted to get an antibiotic so she wold be better before surgery.

## 2023-05-24 NOTE — Telephone Encounter (Signed)
Scheduled appointment

## 2023-05-24 NOTE — Patient Instructions (Signed)
Take Zithromax Z pak 2 tabs day 1 followed by one tab days 2-5. We will check with heart care about the results of your recent Zio monitor.

## 2023-05-25 ENCOUNTER — Encounter (HOSPITAL_COMMUNITY): Payer: Self-pay | Admitting: Physician Assistant

## 2023-05-31 ENCOUNTER — Ambulatory Visit (INDEPENDENT_AMBULATORY_CARE_PROVIDER_SITE_OTHER): Payer: Medicare Other

## 2023-05-31 DIAGNOSIS — J4550 Severe persistent asthma, uncomplicated: Secondary | ICD-10-CM | POA: Diagnosis not present

## 2023-05-31 MED ORDER — BENRALIZUMAB 30 MG/ML ~~LOC~~ SOSY
30.0000 mg | PREFILLED_SYRINGE | Freq: Once | SUBCUTANEOUS | Status: AC
Start: 2023-05-31 — End: 2023-05-31
  Administered 2023-05-31: 30 mg via SUBCUTANEOUS

## 2023-05-31 NOTE — Progress Notes (Signed)
Immunotherapy   Patient Details  Name: Tara [name] MRN: [id number] Date of Birth: [birth year]  05/31/2023  Tara [name] started injections for Fasenra 30mg /mL Frequency: Q 4 weeks x 3, then Q 8 weeks Consent signed and patient instructions given.   Tara [name] J Tara [name] 05/31/2023, 11:37 AM

## 2023-06-01 ENCOUNTER — Ambulatory Visit (HOSPITAL_COMMUNITY): Admission: RE | Admit: 2023-06-01 | Payer: Medicare Other | Source: Home / Self Care | Admitting: Neurosurgery

## 2023-06-01 ENCOUNTER — Encounter (HOSPITAL_COMMUNITY): Admission: RE | Payer: Self-pay | Source: Home / Self Care

## 2023-06-01 ENCOUNTER — Encounter: Payer: Self-pay | Admitting: Internal Medicine

## 2023-06-01 DIAGNOSIS — B1920 Unspecified viral hepatitis C without hepatic coma: Secondary | ICD-10-CM | POA: Insufficient documentation

## 2023-06-01 DIAGNOSIS — Z8679 Personal history of other diseases of the circulatory system: Secondary | ICD-10-CM | POA: Insufficient documentation

## 2023-06-01 DIAGNOSIS — G709 Myoneural disorder, unspecified: Secondary | ICD-10-CM | POA: Insufficient documentation

## 2023-06-01 DIAGNOSIS — F419 Anxiety disorder, unspecified: Secondary | ICD-10-CM | POA: Insufficient documentation

## 2023-06-01 DIAGNOSIS — M7989 Other specified soft tissue disorders: Secondary | ICD-10-CM | POA: Insufficient documentation

## 2023-06-01 DIAGNOSIS — M549 Dorsalgia, unspecified: Secondary | ICD-10-CM | POA: Insufficient documentation

## 2023-06-01 DIAGNOSIS — H5710 Ocular pain, unspecified eye: Secondary | ICD-10-CM | POA: Insufficient documentation

## 2023-06-01 DIAGNOSIS — M8580 Other specified disorders of bone density and structure, unspecified site: Secondary | ICD-10-CM | POA: Insufficient documentation

## 2023-06-01 DIAGNOSIS — Z8619 Personal history of other infectious and parasitic diseases: Secondary | ICD-10-CM | POA: Insufficient documentation

## 2023-06-01 DIAGNOSIS — R609 Edema, unspecified: Secondary | ICD-10-CM | POA: Insufficient documentation

## 2023-06-01 DIAGNOSIS — T8859XA Other complications of anesthesia, initial encounter: Secondary | ICD-10-CM | POA: Insufficient documentation

## 2023-06-01 DIAGNOSIS — M3119 Other thrombotic microangiopathy: Secondary | ICD-10-CM | POA: Insufficient documentation

## 2023-06-01 DIAGNOSIS — Z9109 Other allergy status, other than to drugs and biological substances: Secondary | ICD-10-CM | POA: Insufficient documentation

## 2023-06-01 DIAGNOSIS — R5383 Other fatigue: Secondary | ICD-10-CM | POA: Insufficient documentation

## 2023-06-01 DIAGNOSIS — R1310 Dysphagia, unspecified: Secondary | ICD-10-CM | POA: Insufficient documentation

## 2023-06-01 DIAGNOSIS — R209 Unspecified disturbances of skin sensation: Secondary | ICD-10-CM | POA: Insufficient documentation

## 2023-06-01 DIAGNOSIS — D6949 Other primary thrombocytopenia: Secondary | ICD-10-CM | POA: Insufficient documentation

## 2023-06-01 DIAGNOSIS — M069 Rheumatoid arthritis, unspecified: Secondary | ICD-10-CM | POA: Insufficient documentation

## 2023-06-01 DIAGNOSIS — E785 Hyperlipidemia, unspecified: Secondary | ICD-10-CM | POA: Insufficient documentation

## 2023-06-01 DIAGNOSIS — R252 Cramp and spasm: Secondary | ICD-10-CM | POA: Insufficient documentation

## 2023-06-01 DIAGNOSIS — M2550 Pain in unspecified joint: Secondary | ICD-10-CM | POA: Insufficient documentation

## 2023-06-01 DIAGNOSIS — H9319 Tinnitus, unspecified ear: Secondary | ICD-10-CM | POA: Insufficient documentation

## 2023-06-01 DIAGNOSIS — K7460 Unspecified cirrhosis of liver: Secondary | ICD-10-CM | POA: Insufficient documentation

## 2023-06-01 DIAGNOSIS — T7840XA Allergy, unspecified, initial encounter: Secondary | ICD-10-CM | POA: Insufficient documentation

## 2023-06-01 DIAGNOSIS — I499 Cardiac arrhythmia, unspecified: Secondary | ICD-10-CM | POA: Insufficient documentation

## 2023-06-01 DIAGNOSIS — R002 Palpitations: Secondary | ICD-10-CM | POA: Insufficient documentation

## 2023-06-01 SURGERY — THORACIC DISCECTOMY
Anesthesia: General | Site: Back | Laterality: Left

## 2023-06-01 NOTE — Telephone Encounter (Signed)
Called patient and let her know to discuss this with cardiologist on Thursday, she verbalized understanding.

## 2023-06-03 ENCOUNTER — Ambulatory Visit: Payer: Medicare Other

## 2023-06-03 VITALS — BP 130/82 | HR 110 | Ht 66.6 in | Wt 252.0 lb

## 2023-06-03 DIAGNOSIS — I471 Supraventricular tachycardia, unspecified: Secondary | ICD-10-CM | POA: Diagnosis present

## 2023-06-03 DIAGNOSIS — Z01810 Encounter for preprocedural cardiovascular examination: Secondary | ICD-10-CM | POA: Diagnosis not present

## 2023-06-03 DIAGNOSIS — I340 Nonrheumatic mitral (valve) insufficiency: Secondary | ICD-10-CM | POA: Insufficient documentation

## 2023-06-03 MED ORDER — METOPROLOL TARTRATE 25 MG PO TABS
12.5000 mg | ORAL_TABLET | Freq: Two times a day (BID) | ORAL | 3 refills | Status: DC
Start: 2023-06-03 — End: 2024-05-23

## 2023-06-03 NOTE — Patient Instructions (Signed)
Medication Instructions:  Your physician has recommended you make the following change in your medication:   Start Metoprolol tartrate 12.5 mg twice daily.  *If you need a refill on your cardiac medications before your next appointment, please call your pharmacy*   Lab Work: None ordered If you have labs (blood work) drawn today and your tests are completely normal, you will receive your results only by: MyChart Message (if you have MyChart) OR A paper copy in the mail If you have any lab test that is abnormal or we need to change your treatment, we will call you to review the results.   Testing/Procedures: You are scheduled for a Myocardial Perfusion Imaging Study.  Please arrive 15 minutes prior to your appointment time for registration and insurance purposes.  The test will be done over 2 days and  take approximately 3 to 4 hours each day to complete; you may bring reading material.  If someone comes with you to your appointment, they will need to remain in the main lobby due to limited space in the testing area.   How to prepare for your Myocardial Perfusion Test: Do not eat or drink 3 hours prior to your test, except you may have water. Do not consume products containing caffeine (regular or decaffeinated) 12 hours prior to your test. (ex: coffee, chocolate, sodas, tea). Do bring a list of your current medications with you.  If not listed below, you may take your medications as normal. Do not take metoprolol (Lopressor, Toprol) for 24 hours prior to the test.  Bring the medication to your appointment as you may be required to take it once the test is complete. Do wear comfortable clothes (no dresses or overalls) and walking shoes, tennis shoes preferred (No heels or open toe shoes are allowed). Do NOT wear cologne, perfume, aftershave, or lotions (deodorant is allowed). If these instructions are not followed, your test will have to be rescheduled.  If you cannot keep your  appointment, please provide 24 hours notification to the Nuclear Lab, to avoid a possible $50 charge to your account.   Your physician has requested that you have an echocardiogram. Echocardiography is a painless test that uses sound waves to create images of your heart. It provides your doctor with information about the size and shape of your heart and how well your heart's chambers and valves are working. This procedure takes approximately one hour. There are no restrictions for this procedure. Please do NOT wear cologne, perfume, aftershave, or lotions (deodorant is allowed). Please arrive 15 minutes prior to your appointment time.   Follow-Up: At [facility], you and your health needs are our priority.  As part of our continuing mission to provide you with exceptional heart care, we have created designated Provider Care Teams.  These Care Teams include your primary Cardiologist (physician) and Advanced Practice Providers (APPs -  Physician Assistants and Nurse Practitioners) who all work together to provide you with the care you need, when you need it.  We recommend signing up for the patient portal called "MyChart".  Sign up information is provided on this After Visit Summary.  MyChart is used to connect with patients for Virtual Visits (Telemedicine).  Patients are able to view lab/test results, encounter notes, upcoming appointments, etc.  Non-urgent messages can be sent to your provider as well.   To learn more about what you can do with MyChart, go to [email or web address].    Your next appointment:   Based on your results  Provider:   Huntley Dec, MD   Other Instructions  Cardiac Nuclear Scan A cardiac nuclear scan is a test that is done to check the flow of blood to your heart. It is done when you are resting and when you are exercising. The test looks for problems such as: Not enough blood reaching a portion of the heart. The heart muscle not working as it  should. You may need this test if you have: Heart disease. Lab results that are not normal. Had heart surgery or a balloon procedure to open up blocked arteries (angioplasty) or a small mesh tube (stent). Chest pain. Shortness of breath. Had a heart attack. In this test, a special dye (tracer) is put into your bloodstream. The tracer will travel to your heart. A camera will then take pictures of your heart to see how the tracer moves through your heart. This test is usually done at a hospital and takes 2-4 hours. Tell a doctor about: Any allergies you have. All medicines you are taking, including vitamins, herbs, eye drops, creams, and over-the-counter medicines. Any bleeding problems you have. Any surgeries you have had. Any medical conditions you have. Whether you are pregnant or may be pregnant. Any history of asthma or long-term (chronic) lung disease. Any history of heart rhythm disorders or heart valve conditions. What are the risks? Your doctor will talk with you about risks. These may include: Serious chest pain and heart attack. This is only a risk if the stress portion of the test is done. Fast or uneven heartbeats (palpitations). A feeling of warmth in your chest. This feeling usually does not last long. Allergic reaction to the tracer. Shortness of breath or trouble breathing. What happens before the test? Ask your doctor about changing or stopping your normal medicines. Follow instructions from your doctor about what you cannot eat or drink. Remove your jewelry on the day of the test. Ask your doctor if you need to avoid nicotine or caffeine. What happens during the test? An IV tube will be inserted into one of your veins. Your doctor will give you a small amount of tracer through the IV tube. You will wait for 20-40 minutes while the tracer moves through your bloodstream. Your heart will be monitored with an electrocardiogram (ECG). You will lie down on an exam  table. Pictures of your heart will be taken for about 15-20 minutes. You may also have a stress test. For this test, one of these things may be done: You will be asked to exercise on a treadmill or a stationary bike. You will be given medicines that will make your heart work harder. This is done if you are unable to exercise. When blood flow to your heart has peaked, a tracer will again be given through the IV tube. After 20-40 minutes, you will get back on the exam table. More pictures will be taken of your heart. Depending on the tracer that is used, more pictures may need to be taken 3-4 hours later. Your IV tube will be removed when the test is over. The test may vary among doctors and hospitals. What happens after the test? Ask your doctor: Whether you can return to your normal schedule, including diet, activities, travel, and medicines. Whether you should drink more fluids. This will help to remove the tracer from your body. Ask your doctor, or the department that is doing the test: When will my results be ready? How will I get my results? What are my treatment  options? What other tests do I need? What are my next steps? This information is not intended to replace advice given to you by your health care provider. Make sure you discuss any questions you have with your health care provider. Document Revised: 01/13/2022 Document Reviewed: 01/13/2022 Elsevier Patient Education  2023 [employer]  Echocardiogram An echocardiogram is a test that uses sound waves (ultrasound) to produce images of the heart. Images from an echocardiogram can provide important information about: Heart size and shape. The size and thickness and movement of your heart's walls. Heart muscle function and strength. Heart valve function or if you have stenosis. Stenosis is when the heart valves are too narrow. If blood is flowing backward through the heart valves (regurgitation). A tumor or infectious growth  around the heart valves. Areas of heart muscle that are not working well because of poor blood flow or injury from a heart attack. Aneurysm detection. An aneurysm is a weak or damaged part of an artery wall. The wall bulges out from the normal force of blood pumping through the body. Tell a health care provider about: Any allergies you have. All medicines you are taking, including vitamins, herbs, eye drops, creams, and over-the-counter medicines. Any blood disorders you have. Any surgeries you have had. Any medical conditions you have. Whether you are pregnant or may be pregnant. What are the risks? Generally, this is a safe test. However, problems may occur, including an allergic reaction to dye (contrast) that may be used during the test. What happens before the test? No specific preparation is needed. You may eat and drink normally. What happens during the test?  You will take off your clothes from the waist up and put on a hospital gown. Electrodes or electrocardiogram (ECG)patches may be placed on your chest. The electrodes or patches are then connected to a device that monitors your heart rate and rhythm. You will lie down on a table for an ultrasound exam. A gel will be applied to your chest to help sound waves pass through your skin. A handheld device, called a transducer, will be pressed against your chest and moved over your heart. The transducer produces sound waves that travel to your heart and bounce back (or "echo" back) to the transducer. These sound waves will be captured in real-time and changed into images of your heart that can be viewed on a video monitor. The images will be recorded on a computer and reviewed by your health care provider. You may be asked to change positions or hold your breath for a short time. This makes it easier to get different views or better views of your heart. In some cases, you may receive contrast through an IV in one of your veins. This can  improve the quality of the pictures from your heart. The procedure may vary among health care providers and hospitals. What can I expect after the test? You may return to your normal, everyday life, including diet, activities, and medicines, unless your health care provider tells you not to do that. Follow these instructions at home: It is up to you to get the results of your test. Ask your health care provider, or the department that is doing the test, when your results will be ready. Keep all follow-up visits. This is important. Summary An echocardiogram is a test that uses sound waves (ultrasound) to produce images of the heart. Images from an echocardiogram can provide important information about the size and shape of your heart, heart  muscle function, heart valve function, and other possible heart problems. You do not need to do anything to prepare before this test. You may eat and drink normally. After the echocardiogram is completed, you may return to your normal, everyday life, unless your health care provider tells you not to do that. This information is not intended to replace advice given to you by your health care provider. Make sure you discuss any questions you have with your health care provider. Document Revised: 04/30/2021 Document Reviewed: 04/09/2020 Elsevier Patient Education  2023 [employer].

## 2023-06-03 NOTE — Assessment & Plan Note (Signed)
She does not report any symptoms. Overall supraventricular ectopy burden 7.8%. Reviewed benign nature of these. Various triggers including obesity, sleep apnea untreated, chronic pain reviewed.  Options to use beta-blockers on an as-needed basis on a scheduled basis discussed. Given her baseline sinus tachycardia, would recommend starting low-dose metoprolol tartrate 12.5 mg twice daily.

## 2023-06-03 NOTE — Assessment & Plan Note (Signed)
She has an elective surgery for the back coming up on October 21. Her functional status is somewhat limited due to back pain.  Unable to assess if she is able to meet 4 METS. She does have cardiovascular risk factors.  Discussed further evaluation with treadmill exercise nuclear imaging test.  If unable to attain reasonable effort or target heart rate rate, switch to Lexiscan stress test.  Should be okay to proceed with surgery if she does not have any high risk findings.

## 2023-06-03 NOTE — Progress Notes (Addendum)
Cardiology Consultation:    Date:  06/03/2023   ID:  Tara [name], DOB 1956/06/27, MRN [id number]  PCP:  [name], MD  Cardiologist:  Marlyn Corporal Sadie Pickar, MD   Referring MD: [name], MD   No chief complaint on file.    ASSESSMENT AND PLAN:   [name] 67 year old woman with history of mild mitral regurgitation, frequent supraventricular ectopy burden up to 8% on recent event monitor mostly asymptomatic, thrombotic thrombocytopenic purpura diagnosed in 1980s, prediabetes, left breast cancer s/p lumpectomy and chemoradiation therapy, GERD, rheumatoid arthritis, obesity, chronic back pain pending laminectomy of T11-T12 vertebral tentatively scheduled for October 21 with Dr. Jordan Likes referred here for further evaluation of abnormal event monitor findings.   Problem List Items Addressed This Visit     PSVT (paroxysmal supraventricular tachycardia) (HCC) asymptomatic short runs on event monitor September 2024 - Primary    She does not report any symptoms. Overall supraventricular ectopy burden 7.8%. Reviewed benign nature of these. Various triggers including obesity, sleep apnea untreated, chronic pain reviewed.  Options to use beta-blockers on an as-needed basis on a scheduled basis discussed. Given her baseline sinus tachycardia, would recommend starting low-dose metoprolol tartrate 12.5 mg twice daily.        Relevant Medications   metoprolol tartrate (LOPRESSOR) 25 MG tablet   Other Relevant Orders   EKG 12-Lead (Completed)   ECHOCARDIOGRAM COMPLETE   Mitral regurgitation    Mild mitral regurgitation on prior echocardiogram 2014. No further follow-up. Will review with interval follow-up transthoracic echocardiogram.      Relevant Medications   metoprolol tartrate (LOPRESSOR) 25 MG tablet   Preoperative cardiovascular examination    She has an elective surgery for the back coming up on October 21. Her functional status is somewhat limited due to back pain.   Unable to assess if she is able to meet 4 METS. She does have cardiovascular risk factors.  Discussed further evaluation with treadmill exercise nuclear imaging test.  If unable to attain reasonable effort or target heart rate rate, switch to Lexiscan stress test.  Should be okay to proceed with surgery if she does not have any high risk findings.       Relevant Orders   MYOCARDIAL PERFUSION IMAGING   Cardiac Stress Test: Informed Consent Details: Physician/Practitioner Attestation; Transcribe to consent form and obtain patient signature   Return to clinic in 6 months or as needed.   Addendum: [06-09-2023] -Lexiscan stress test with nuclear imaging from 06-08-2023, reviewed shows no ischemia, low risk study. In this context is okay to proceed with her elective surgery for the spine as currently planned. Continue with low-dose metoprolol tartrate be started at office visit as above. Return to clinic for follow-up with [name] in 6 months   History of Present Illness:    Tara [name] is a 67 y.o. female who is being seen today for the evaluation of palpitations and recent event monitor findings at the request of Baxley, Luanna Cole, MD.  Has history of mild MR, mild biatrial dilatation on prior echocardiogram from 2014, up to 8% supraventricular ectopy burden with short runs of SVT [longest lasting 17 beats on the monitor], prediabetes, anxiety, persistent asthma, atopic dermatitis, allergic rhinitis, oral candidiasis history,, left breast cancer in 2012 s/p lumpectomy and 4 cycles of adjuvant chemotherapy with docetaxel/cyclophosphamide with trastuzumab Herceptin and radiation therapy continued for 1 year,, GERD, rheumatoid arthritis, thrombotic thrombocytopenic purpura, was evaluated for cardiotoxicity of chemotherapy back in 2012 and followed up  with echocardiograms until 2014 followed up with Dr. Tanja Port.  Reports using digoxin over many years for palpitations since her initial diagnosis of  TTP 1980s.   She has not had any further follow-up with cardiology since 2014.  She is scheduled for laminectomy of T11-T12 vertebrae on October 21 with Dr. Jordan Likes.  Here for the visit today accompanied by her husband. Mentions overall her functional status is somewhat limited due to back pain.  Was able to walk at [facility] and across the parking lot with mild shortness of breath.  Denies any chest pain.  Denies any palpitations, lightheadedness, dizziness or syncopal episodes.  On recent visit with PCP with irregular heartbeat auscultated she underwent a heart monitor for 4 days.  Sent here for further evaluation.  EKG noted sinus tachycardia with heart rate in 110/min, isolated ventricular ectopy noted.  PR interval normal 182 ms QRS morphology suggests left ventricular hypertrophy. Comparison prior EKG from September 2021 was similar but slower heart rate  4-day heart rhythm monitor from 05/07/2023 noted average heart rate of 83/min.  Supraventricular ectopy burden 7.8% and rare ventricular ectopy less than 1%.  There were up to 4 supraventricular tachyarrhythmia runs with the longest episode lasting 17 beats.  There were total 23+1 patient triggers with most correlating with sinus rhythm at times sinus tachycardia and occasionally with supraventricular and ventricular ectopic beats.  The longest episode of SVT lasting 17 beats, lasted for 7.7 seconds occurred at 3:29 AM on 05-18-2023.  She tells me that she did not have any significant symptoms during the monitoring.  She was just pushing the button as a light came on.  Last echocardiogram available to review is from 10 years ago reported LVEF 55 to 60% grade 1 diastolic dysfunction, mild MR, mild biatrial dilatation. She has had serial echocardiograms done from November 2012 through February 2014 during her ongoing treatment for breast cancer with chemotherapy. There was no evidence of concern for cardiotoxicity.  Recent blood work TSH was normal  2.33 on February 05, 2023. Hemoglobin A1c from May 24, 2023 was well-controlled at 5.5. Lipid panel showed total cholesterol 186, HDL 70, triglycerides 148, LDL 91 on February 05, 2023 Hemoglobin 13.1, hematocrit 40.8, WBC 4.6, platelets 153 on May 24, 2023.  Past Medical History:  Diagnosis Date   Allergy    Anxiety    Arthritis    Back pain    Chronic edema    Chronic hepatitis C without hepatic coma (HCC) 10/29/2012   Chronic pain syndrome 07/01/2018   Cirrhosis (HCC) 05/10/2017   Class 2 severe obesity with serious comorbidity and body mass index (BMI) of 37.0 to 37.9 in adult (HCC) 10/25/2018   Cold hands and feet    Complication of anesthesia    difficulty waking up/dizzy/lightheaded   Controlled type 2 diabetes mellitus without complication, without long-term current use of insulin (HCC) 07/11/2018   Cough    Dry eye syndrome of both eyes 09/02/2022   Dysrhythmia, cardiac 09/28/2011   She has no records with her today. She tells me that a few years ago in [place] she had palpitations and a cardiology placed her on digoxin, she does not know what type of dysrhythmia. She has not had any palpitations recently     Environmental allergies    Eye pain    Fatigue    Gastroesophageal reflux disease 11/05/2015   H/O varicella    H/O varicose veins    Hepatic cirrhosis (HCC)    Hepatitis C  History of breast cancer 01/15/2020   History of cardiac arrhythmia 01/15/2020   History of chemotherapy 2013   left breast cancer   History of hepatitis C 01/15/2020   History of left hip replacement 08/29/2018   History of measles, mumps, or rubella    History of osteoporosis 01/15/2020   History of total right hip replacement 08/29/2018   History of TTP (thrombotic thrombocytopenic purpura) 10/29/2012   Hx of radiation therapy 12/15/11 - 01/29/12   left breast   Hyperlipidemia    Hypokalemia 12/29/2013   Hypothyroidism    Intrinsic atopic dermatitis 01/15/2020   Irregular heart beat     under control   Joint pain    Leg cramps    Lymphedema of arm 12/15/2011   Malignant neoplasm of axillary tail of left breast in female, estrogen receptor positive (HCC) 08/12/2015   Malignant neoplasm of upper-outer quadrant of left breast in female, estrogen receptor positive (HCC) 07/17/2014   Moderate persistent asthma without complication 01/15/2020   Neuromuscular disorder (HCC)    left foot nerve damage- neuropathy    Oral candidiasis 09/02/2022   Osteopenia    Other allergic rhinitis 10/29/2012   Palpitations    Prediabetes 10/25/2018   Radiculopathy 07/2020   Rheumatoid arthritis (HCC)    Ringing in ear    Rosacea, acne 09/28/2011   Severe persistent asthma without complication 09/02/2022   Status post bilateral hip replacements 10/29/2012   Swallowing difficulty    Swelling of both lower extremities    Thrombocytopenia, primary (HCC)    TTP (thrombotic thrombocytopenic purpura) (HCC)    1982   Vitamin D deficiency 10/25/2018    Past Surgical History:  Procedure Laterality Date   BREAST LUMPECTOMY  08/03/11   left lumpectomy and slnbx,T1cN0,triple pos   CATARACT EXTRACTION, BILATERAL     COLONOSCOPY  01/26/2019   COLONOSCOPY  08/12/2020   elbow pins Left    FOOT SURGERY Left    multiple   POLYPECTOMY     PORTACATH PLACEMENT  08/03/2011   Procedure: INSERTION PORT-A-CATH;  Surgeon: Caleen Essex III, MD;  Location: [facility] OR;  Service: General;  Laterality: Right;   portacath removal     TOTAL HIP ARTHROPLASTY Bilateral    UPPER GASTROINTESTINAL ENDOSCOPY     UPPER GASTROINTESTINAL ENDOSCOPY  08/12/2020    Current Medications: Current Meds  Medication Sig   acetaminophen (TYLENOL) 500 MG tablet Take 500 mg by mouth every 6 (six) hours as needed for mild pain, moderate pain or headache.    ADVAIR HFA 45-21 MCG/ACT inhaler Inhale 2 puffs into the lungs 2 (two) times daily.   albuterol (VENTOLIN HFA) 108 (90 Base) MCG/ACT inhaler Two puffs every 4 hours if needed  for wheezing or coughing .  May use 2 puffs 5-15 minutes prior to exercise.   azelastine (ASTELIN) 0.1 % nasal spray Place 1-2 sprays into both nostrils 2 (two) times daily as needed.   benzonatate (TESSALON) 100 MG capsule Take 1 capsule (100 mg total) by mouth 3 (three) times daily as needed.   clobetasol ointment (TEMOVATE) 0.05 % Apply 1 Application topically 2 (two) times daily.   diphenhydramine-acetaminophen (TYLENOL PM) 25-500 MG TABS tablet Take 1 tablet by mouth at bedtime as needed (pain/sleep.).   famotidine (PEPCID) 20 MG tablet Take 1 tablet two times a day.  Patient needs follow up appointment for future refills. Please call [phone] to schedule an appointment.   gabapentin (NEURONTIN) 100 MG capsule Take 100 mg by mouth 3 (  three) times daily as needed (pain.).   Glucosamine-Chondroitin (MOVE FREE PO) Take 1 tablet by mouth in the morning.   levothyroxine (SYNTHROID) 88 MCG tablet Take 1 tablet (88 mcg total) by mouth daily.   magnesium oxide (MAG-OX) 400 (240 Mg) MG tablet Take 400 mg by mouth in the morning.   metFORMIN (GLUCOPHAGE) 500 MG tablet Take 1 tablet (500 mg total) by mouth 2 (two) times daily with a meal.   metoprolol tartrate (LOPRESSOR) 25 MG tablet Take 0.5 tablets (12.5 mg total) by mouth 2 (two) times daily.   montelukast (SINGULAIR) 10 MG tablet TAKE 1 TABLET BY MOUTH AT BEDTIME   nystatin (MYCOSTATIN) 100000 UNIT/ML suspension Use as directed 5 mLs in the mouth or throat 4 (four) times daily as needed (thrush/irritation (due to inhaler use)).   oxyCODONE (OXY IR/ROXICODONE) 5 MG immediate release tablet Take 5 mg by mouth every 6 (six) hours as needed (pain.).   Polyethyl Glycol-Propyl Glycol (SYSTANE) 0.4-0.3 % SOLN Place 1-2 drops into both eyes 3 (three) times daily as needed (dry/irritated eyes.).   Probiotic Product (PROBIOTIC PO) Take 1 capsule by mouth in the morning.   rosuvastatin (CRESTOR) 5 MG tablet TAKE ONE TABLET BY MOUTH DAILY    Spacer/Aero-Holding Chambers (AEROCHAMBER MV) inhaler Use as instructed   triamcinolone ointment (KENALOG) 0.1 % Apply 1 Application topically 2 (two) times daily.   Current Facility-Administered Medications for the 06/03/23 encounter (Office Visit) with Saranne Crislip, Marlyn Corporal, MD  Medication   dupilumab (DUPIXENT) prefilled syringe 300 mg     Allergies:   Aspirin and Nsaids   Social History   Socioeconomic History   Marital status: Married    Spouse name: Tim   Number of children: 0   Years of education: Not on file   Highest education level: Not on file  Occupational History   Occupation: Retired  Tobacco Use   Smoking status: Never   Smokeless tobacco: Never  Vaping Use   Vaping status: Never Used  Substance and Sexual Activity   Alcohol use: Yes    Alcohol/week: 1.0 standard drink of alcohol    Types: 1 Glasses of wine per week    Comment: occ   Drug use: No   Sexual activity: Not on file  Other Topics Concern   Not on file  Social History Narrative   Not on file   Social Determinants of Health   Financial Resource Strain: Not on file  Food Insecurity: Not on file  Transportation Needs: Not on file  Physical Activity: Not on file  Stress: Not on file  Social Connections: Unknown (12/29/2021)   Received from [facility]   Social Network    Social Network: Not on file     Family History: The patient's family history includes Alcohol abuse in her maternal grandfather; Brain cancer in her maternal grandfather; COPD in her father and mother; Colon polyps in her mother; Heart disease in her mother; Obesity in her father and mother; Pneumonia in her mother; Thyroid disease in her mother. There is no history of Hyperlipidemia, Hypertension, Colon cancer, Rectal cancer, Stomach cancer, or Esophageal cancer. ROS:   Please see the history of present illness.    All 14 point review of systems negative except as described per history of present  illness.  EKGs/Labs/Other Studies Reviewed:    The following studies were reviewed today:   EKG:  EKG Interpretation Date/Time:  Thursday June 03 2023 15:13:15 EDT Ventricular Rate:  110 PR Interval:  182 QRS Duration:  84 QT Interval:  328 QTC Calculation: 443 R Axis:   -18  Text Interpretation: Sinus tachycardia with occasional Premature ventricular complexes Minimal voltage criteria for LVH, may be normal variant ( R in aVL ) Inferior infarct , age undetermined Possible Anterior infarct , age undetermined When compared with ECG of 11-May-2020 09:55, PREVIOUS ECG IS PRESENT Confirmed by Huntley Dec reddy [phone]) on 06/03/2023 3:23:36 PM    Recent Labs: 02/05/2023: TSH 2.33 05/24/2023: ALT 18; BUN 10; Creatinine, Ser 0.99; Hemoglobin 13.1; Platelets 153; Potassium 4.2; Sodium 137  Recent Lipid Panel    Component Value Date/Time   CHOL 186 02/05/2023 0925   TRIG 148 02/05/2023 0925   HDL 70 02/05/2023 0925   CHOLHDL 2.7 02/05/2023 0925   VLDL 29 06/29/2016 1103   LDLCALC 91 02/05/2023 0925    Physical Exam:    VS:  BP 130/82   Pulse (!) 110   Ht 5' 6.6" (1.692 m)   Wt 252 lb (114.3 kg)   SpO2 95%   BMI 39.94 kg/m     Wt Readings from Last 3 Encounters:  06/03/23 252 lb (114.3 kg)  05/24/23 255 lb 9.6 oz (115.9 kg)  05/24/23 255 lb (115.7 kg)     GENERAL:  Well nourished, well developed in no acute distress NECK: No JVD; No carotid bruits CARDIAC: RRR, S1 and S2 present, no murmurs, no rubs, no gallops CHEST:  Clear to auscultation without rales, wheezing or rhonchi  Extremities: No pitting pedal edema. Pulses bilaterally symmetric with radial 2+ and dorsalis pedis 2+ NEUROLOGIC:  Alert and oriented x 3  Medication Adjustments/Labs and Tests Ordered: Current medicines are reviewed at length with the patient today.  Concerns regarding medicines are outlined above.  Orders Placed This Encounter  Procedures   Cardiac Stress Test: Informed Consent Details:  Physician/Practitioner Attestation; Transcribe to consent form and obtain patient signature   MYOCARDIAL PERFUSION IMAGING   EKG 12-Lead   ECHOCARDIOGRAM COMPLETE   Meds ordered this encounter  Medications   metoprolol tartrate (LOPRESSOR) 25 MG tablet    Sig: Take 0.5 tablets (12.5 mg total) by mouth 2 (two) times daily.    Dispense:  90 tablet    Refill:  3    Signed, Evoleth Nordmeyer reddy Zael Shuman, MD, MPH, [facility]. 06/03/2023 4:47 PM    [facility]

## 2023-06-03 NOTE — Assessment & Plan Note (Signed)
Mild mitral regurgitation on prior echocardiogram 2014. No further follow-up. Will review with interval follow-up transthoracic echocardiogram.

## 2023-06-04 ENCOUNTER — Other Ambulatory Visit: Payer: Self-pay | Admitting: Radiology

## 2023-06-04 MED ORDER — LEVOTHYROXINE SODIUM 88 MCG PO TABS: 88.0000 ug | ORAL_TABLET | Freq: Every day | ORAL | 0 refills | Status: DC

## 2023-06-08 ENCOUNTER — Ambulatory Visit: Payer: Medicare Other

## 2023-06-08 DIAGNOSIS — Z01810 Encounter for preprocedural cardiovascular examination: Secondary | ICD-10-CM

## 2023-06-08 MED ORDER — TECHNETIUM TC 99M TETROFOSMIN IV KIT
32.8500 | PACK | Freq: Once | INTRAVENOUS | Status: AC | PRN
  Administered 2023-06-08: 32.8 via INTRAVENOUS

## 2023-06-08 MED ORDER — TECHNETIUM TC 99M TETROFOSMIN IV KIT
10.9000 | PACK | Freq: Once | INTRAVENOUS | Status: AC | PRN
  Administered 2023-06-08: 10.9 via INTRAVENOUS

## 2023-06-08 MED ORDER — REGADENOSON 0.4 MG/5ML IV SOLN
0.4000 mg | Freq: Once | INTRAVENOUS | Status: AC
Start: 2023-06-08 — End: 2023-06-08
  Administered 2023-06-08: 0.4 mg via INTRAVENOUS

## 2023-06-09 ENCOUNTER — Ambulatory Visit: Payer: Medicare Other

## 2023-06-09 LAB — MYOCARDIAL PERFUSION IMAGING
LV dias vol: 59 mL (ref 46–106)
LV sys vol: 14 mL
Nuc Stress EF: 76 %
Peak HR: 107 {beats}/min
Rest HR: 85 {beats}/min
Rest Nuclear Isotope Dose: 10.9 mCi
SDS: 1
SRS: 0
SSS: 1
Stress Nuclear Isotope Dose: 32.8 mCi
TID: 1.02

## 2023-06-09 NOTE — Progress Notes (Signed)
Reviewed the results, sent a note to patient on MyChart. Will addend the office note for preop recommendations, please forward to surgeon's office.

## 2023-06-11 ENCOUNTER — Telehealth: Payer: Self-pay

## 2023-06-11 NOTE — Telephone Encounter (Signed)
-----   Message from Marlyn Corporal Madireddy sent at 06/09/2023 12:25 PM EDT ----- Reviewed the results, sent a note to patient on MyChart. Will addend the office note for preop recommendations, please forward to surgeon's office.

## 2023-06-11 NOTE — Telephone Encounter (Signed)
Patient notified of results.

## 2023-06-18 ENCOUNTER — Ambulatory Visit: Payer: Medicare Other

## 2023-06-18 DIAGNOSIS — I071 Rheumatic tricuspid insufficiency: Secondary | ICD-10-CM | POA: Insufficient documentation

## 2023-06-18 DIAGNOSIS — I361 Nonrheumatic tricuspid (valve) insufficiency: Secondary | ICD-10-CM | POA: Diagnosis not present

## 2023-06-18 DIAGNOSIS — I471 Supraventricular tachycardia, unspecified: Secondary | ICD-10-CM

## 2023-06-18 DIAGNOSIS — I4719 Other supraventricular tachycardia: Secondary | ICD-10-CM | POA: Insufficient documentation

## 2023-06-18 LAB — ECHOCARDIOGRAM COMPLETE
Area-P 1/2: 7.29 cm2
MV M vel: 3.99 m/s
MV Peak grad: 63.7 mm[Hg]
S' Lateral: 3.3 cm

## 2023-06-18 NOTE — Progress Notes (Addendum)
Mrs Caracciolo states that she had upper respiratory started before scheduled OR on 06/01/23, patient was treated with cough meds. Symptoms lasted 4 days and no further s/s.  Mrs Devanney saw a cardiologist for SVT,  a Stress was  done, patient was cleared by Dr. Vern Claude Madireddy.  I reviewed patient medications and updated as needed.

## 2023-06-20 NOTE — Anesthesia Preprocedure Evaluation (Signed)
Anesthesia Evaluation  Patient identified by MRN, date of birth, ID band Patient awake    Reviewed: Allergy & Precautions, H&P , NPO status , Patient's Chart, lab work & pertinent test results  Airway Mallampati: I  TM Distance: >3 FB Neck ROM: Full    Dental no notable dental hx. (+) Teeth Intact, Dental Advisory Given   Pulmonary neg pulmonary ROS, asthma    Pulmonary exam normal breath sounds clear to auscultation       Cardiovascular Exercise Tolerance: Good negative cardio ROS Normal cardiovascular exam Rhythm:Regular Rate:Normal  H/o paroxysmal Afib, controlled with dig, 11/12 ECHO- normal LVF, trivial MR   Neuro/Psych   Anxiety     negative neurological ROS  negative psych ROS   GI/Hepatic negative GI ROS, Neg liver ROS,GERD  Controlled,,(+) Cirrhosis       , Hepatitis -, C  Endo/Other  diabetes, Type 2, Oral Hypoglycemic AgentsHypothyroidism  Morbid obesity  Renal/GU negative Renal ROS  negative genitourinary   Musculoskeletal  (+) Arthritis , Rheumatoid disorders,    Abdominal  (+) + obese  Peds  Hematology negative hematology ROS (+) Blood dyscrasia, anemia   Anesthesia Other Findings   Reproductive/Obstetrics negative OB ROS Breast cancer- no chemo or rad yet                             Anesthesia Physical Anesthesia Plan  ASA: 3  Anesthesia Plan: General   Post-op Pain Management: Tylenol PO (pre-op)*   Induction: Intravenous  PONV Risk Score and Plan: 3 and Ondansetron, Dexamethasone, Midazolam and Treatment may vary due to age or medical condition  Airway Management Planned: Oral ETT  Additional Equipment:   Intra-op Plan:   Post-operative Plan: Extubation in OR  Informed Consent: I have reviewed the patients History and Physical, chart, labs and discussed the procedure including the risks, benefits and alternatives for the proposed anesthesia with the  patient or authorized representative who has indicated his/her understanding and acceptance.     Dental advisory given  Plan Discussed with: CRNA and Surgeon  Anesthesia Plan Comments:         Anesthesia Quick Evaluation

## 2023-06-21 ENCOUNTER — Observation Stay (HOSPITAL_COMMUNITY)
Admission: RE | Admit: 2023-06-21 | Discharge: 2023-06-22 | Disposition: A | Discharge status: Home / Self Care | Payer: Medicare Other | Attending: Neurosurgery | Admitting: Neurosurgery

## 2023-06-21 ENCOUNTER — Other Ambulatory Visit: Payer: Self-pay

## 2023-06-21 ENCOUNTER — Ambulatory Visit (HOSPITAL_COMMUNITY)
Admission: RE | Disposition: A | Discharge status: Home / Self Care | Payer: Self-pay | Source: Home / Self Care | Attending: Neurosurgery

## 2023-06-21 ENCOUNTER — Ambulatory Visit (HOSPITAL_COMMUNITY): Payer: Medicare Other

## 2023-06-21 ENCOUNTER — Encounter (HOSPITAL_COMMUNITY): Payer: Self-pay | Admitting: Neurosurgery

## 2023-06-21 ENCOUNTER — Ambulatory Visit (HOSPITAL_BASED_OUTPATIENT_CLINIC_OR_DEPARTMENT_OTHER): Payer: Self-pay | Admitting: Anesthesiology

## 2023-06-21 ENCOUNTER — Ambulatory Visit (HOSPITAL_COMMUNITY): Payer: Self-pay | Admitting: Anesthesiology

## 2023-06-21 DIAGNOSIS — Z853 Personal history of malignant neoplasm of breast: Secondary | ICD-10-CM | POA: Diagnosis not present

## 2023-06-21 DIAGNOSIS — M4724 Other spondylosis with radiculopathy, thoracic region: Principal | ICD-10-CM | POA: Insufficient documentation

## 2023-06-21 DIAGNOSIS — Z79899 Other long term (current) drug therapy: Secondary | ICD-10-CM | POA: Insufficient documentation

## 2023-06-21 DIAGNOSIS — M4804 Spinal stenosis, thoracic region: Secondary | ICD-10-CM

## 2023-06-21 DIAGNOSIS — E039 Hypothyroidism, unspecified: Secondary | ICD-10-CM | POA: Diagnosis not present

## 2023-06-21 DIAGNOSIS — Z7984 Long term (current) use of oral hypoglycemic drugs: Secondary | ICD-10-CM | POA: Diagnosis not present

## 2023-06-21 DIAGNOSIS — E119 Type 2 diabetes mellitus without complications: Secondary | ICD-10-CM | POA: Insufficient documentation

## 2023-06-21 DIAGNOSIS — M5414 Radiculopathy, thoracic region: Principal | ICD-10-CM | POA: Diagnosis present

## 2023-06-21 DIAGNOSIS — Z96643 Presence of artificial hip joint, bilateral: Secondary | ICD-10-CM | POA: Insufficient documentation

## 2023-06-21 HISTORY — PX: THORACIC DISCECTOMY: SHX6113

## 2023-06-21 LAB — GLUCOSE, CAPILLARY
Glucose-Capillary: 132 mg/dL — ABNORMAL HIGH (ref 70–99)
Glucose-Capillary: 122 mg/dL — ABNORMAL HIGH (ref 70–99)
Glucose-Capillary: 135 mg/dL — ABNORMAL HIGH (ref 70–99)
Glucose-Capillary: 118 mg/dL — ABNORMAL HIGH (ref 70–99)

## 2023-06-21 SURGERY — THORACIC DISCECTOMY
Anesthesia: General | Site: Back | Laterality: Left

## 2023-06-21 MED ORDER — CEFAZOLIN SODIUM-DEXTROSE 2-4 GM/100ML-% IV SOLN
2.0000 g | INTRAVENOUS | Status: AC
  Administered 2023-06-21: 2 g via INTRAVENOUS

## 2023-06-21 MED ORDER — CYCLOBENZAPRINE HCL 10 MG PO TABS
10.0000 mg | ORAL_TABLET | Freq: Three times a day (TID) | ORAL | Status: DC | PRN
  Administered 2023-06-22: 10 mg via ORAL
  Filled 2023-06-21: qty 1

## 2023-06-21 MED ORDER — PROPOFOL 10 MG/ML IV BOLUS
INTRAVENOUS | Status: AC
  Filled 2023-06-21: qty 20

## 2023-06-21 MED ORDER — DEXAMETHASONE SODIUM PHOSPHATE 10 MG/ML IJ SOLN
INTRAMUSCULAR | Status: DC | PRN
  Administered 2023-06-21: 10 mg via INTRAVENOUS

## 2023-06-21 MED ORDER — FENTANYL CITRATE (PF) 250 MCG/5ML IJ SOLN
INTRAMUSCULAR | Status: AC
  Filled 2023-06-21: qty 5

## 2023-06-21 MED ORDER — ROSUVASTATIN CALCIUM 5 MG PO TABS
5.0000 mg | ORAL_TABLET | Freq: Every day | ORAL | Status: DC
  Administered 2023-06-21: 5 mg via ORAL
  Filled 2023-06-21: qty 1

## 2023-06-21 MED ORDER — DEXMEDETOMIDINE HCL IN NACL 80 MCG/20ML IV SOLN
INTRAVENOUS | Status: DC | PRN
  Administered 2023-06-21 (×2): 4 ug via INTRAVENOUS

## 2023-06-21 MED ORDER — MIDAZOLAM HCL 2 MG/2ML IJ SOLN
INTRAMUSCULAR | Status: AC
  Filled 2023-06-21: qty 2

## 2023-06-21 MED ORDER — HYDROMORPHONE HCL 1 MG/ML IJ SOLN: 1.0000 mg | INTRAMUSCULAR | Status: DC | PRN

## 2023-06-21 MED ORDER — FENTANYL CITRATE (PF) 250 MCG/5ML IJ SOLN
INTRAMUSCULAR | Status: DC | PRN
  Administered 2023-06-21 (×2): 50 ug via INTRAVENOUS
  Administered 2023-06-21: 100 ug via INTRAVENOUS

## 2023-06-21 MED ORDER — BUPIVACAINE HCL (PF) 0.25 % IJ SOLN
INTRAMUSCULAR | Status: DC | PRN
  Administered 2023-06-21: 20 mL

## 2023-06-21 MED ORDER — LACTATED RINGERS IV SOLN
INTRAVENOUS | Status: DC
Start: 2023-06-21 — End: 2023-06-21

## 2023-06-21 MED ORDER — SODIUM CHLORIDE 0.9% FLUSH: 3.0000 mL | Freq: Two times a day (BID) | INTRAVENOUS | Status: DC

## 2023-06-21 MED ORDER — GABAPENTIN 100 MG PO CAPS: 100.0000 mg | ORAL_CAPSULE | Freq: Three times a day (TID) | ORAL | Status: DC | PRN

## 2023-06-21 MED ORDER — ACETAMINOPHEN 650 MG RE SUPP: 650.0000 mg | RECTAL | Status: DC | PRN

## 2023-06-21 MED ORDER — CEFAZOLIN SODIUM-DEXTROSE 2-4 GM/100ML-% IV SOLN
INTRAVENOUS | Status: AC
  Filled 2023-06-21: qty 100

## 2023-06-21 MED ORDER — CEFAZOLIN SODIUM-DEXTROSE 1-4 GM/50ML-% IV SOLN
1.0000 g | Freq: Three times a day (TID) | INTRAVENOUS | Status: AC
  Administered 2023-06-21 – 2023-06-22 (×2): 1 g via INTRAVENOUS
  Filled 2023-06-21 (×2): qty 50

## 2023-06-21 MED ORDER — LIDOCAINE 2% (20 MG/ML) 5 ML SYRINGE
INTRAMUSCULAR | Status: DC | PRN
  Administered 2023-06-21: 100 mg via INTRAVENOUS

## 2023-06-21 MED ORDER — OXYCODONE HCL 5 MG/5ML PO SOLN: 5.0000 mg | Freq: Once | ORAL | Status: DC | PRN

## 2023-06-21 MED ORDER — MONTELUKAST SODIUM 10 MG PO TABS
10.0000 mg | ORAL_TABLET | Freq: Every day | ORAL | Status: DC
  Administered 2023-06-21: 10 mg via ORAL
  Filled 2023-06-21: qty 1

## 2023-06-21 MED ORDER — THROMBIN 5000 UNITS EX SOLR
CUTANEOUS | Status: AC
  Filled 2023-06-21: qty 10000

## 2023-06-21 MED ORDER — SODIUM CHLORIDE 0.9% FLUSH
10.0000 mL | Freq: Two times a day (BID) | INTRAVENOUS | Status: DC
  Administered 2023-06-21 (×2): 10 mL via INTRAVENOUS

## 2023-06-21 MED ORDER — 0.9 % SODIUM CHLORIDE (POUR BTL) OPTIME
TOPICAL | Status: DC | PRN
  Administered 2023-05-28: 1000 mL

## 2023-06-21 MED ORDER — AMISULPRIDE (ANTIEMETIC) 5 MG/2ML IV SOLN
INTRAVENOUS | Status: AC
  Administered 2023-06-21: 10 mg
  Filled 2023-06-21: qty 4

## 2023-06-21 MED ORDER — NYSTATIN 100000 UNIT/ML MT SUSP: 5.0000 mL | Freq: Four times a day (QID) | OROMUCOSAL | Status: DC | PRN

## 2023-06-21 MED ORDER — ROCURONIUM BROMIDE 10 MG/ML (PF) SYRINGE: PREFILLED_SYRINGE | INTRAVENOUS | Status: DC | PRN

## 2023-06-21 MED ORDER — ORAL CARE MOUTH RINSE: 15.0000 mL | Freq: Once | OROMUCOSAL | Status: AC

## 2023-06-21 MED ORDER — SUCCINYLCHOLINE CHLORIDE 200 MG/10ML IV SOSY
PREFILLED_SYRINGE | INTRAVENOUS | Status: DC | PRN
  Administered 2023-06-21: 120 mg via INTRAVENOUS

## 2023-06-21 MED ORDER — OXYCODONE HCL 5 MG PO TABS: 5.0000 mg | ORAL_TABLET | Freq: Once | ORAL | Status: DC | PRN

## 2023-06-21 MED ORDER — MENTHOL 3 MG MT LOZG: 1.0000 | LOZENGE | OROMUCOSAL | Status: DC | PRN

## 2023-06-21 MED ORDER — CHLORHEXIDINE GLUCONATE 0.12 % MT SOLN
15.0000 mL | Freq: Once | OROMUCOSAL | Status: AC
  Administered 2023-06-21: 15 mL via OROMUCOSAL
  Filled 2023-06-21: qty 15

## 2023-06-21 MED ORDER — POLYVINYL ALCOHOL 1.4 % OP SOLN: 1.0000 [drp] | Freq: Three times a day (TID) | OPHTHALMIC | Status: DC | PRN

## 2023-06-21 MED ORDER — TRANEXAMIC ACID-NACL 1000-0.7 MG/100ML-% IV SOLN
1000.0000 mg | INTRAVENOUS | Status: DC
  Filled 2023-06-21: qty 100

## 2023-06-21 MED ORDER — CEFAZOLIN IN SODIUM CHLORIDE 3-0.9 GM/100ML-% IV SOLN
INTRAVENOUS | Status: AC
  Filled 2023-06-21: qty 100

## 2023-06-21 MED ORDER — ONDANSETRON HCL 4 MG PO TABS: 4.0000 mg | ORAL_TABLET | Freq: Four times a day (QID) | ORAL | Status: DC | PRN

## 2023-06-21 MED ORDER — HYDROMORPHONE HCL 1 MG/ML IJ SOLN
0.2500 mg | INTRAMUSCULAR | Status: DC | PRN
Start: 2023-06-21 — End: 2023-06-21

## 2023-06-21 MED ORDER — HYDROMORPHONE HCL 1 MG/ML IJ SOLN: 0.2500 mg | INTRAMUSCULAR | Status: DC | PRN

## 2023-06-21 MED ORDER — PHENOL 1.4 % MT LIQD: 1.0000 | OROMUCOSAL | Status: DC | PRN

## 2023-06-21 MED ORDER — ONDANSETRON HCL 4 MG/2ML IJ SOLN
INTRAMUSCULAR | Status: DC | PRN
  Administered 2023-06-21: 4 mg via INTRAVENOUS

## 2023-06-21 MED ORDER — THROMBIN (RECOMBINANT) 5000 UNITS EX SOLR
CUTANEOUS | Status: DC | PRN
  Administered 2023-06-21: 1 via TOPICAL

## 2023-06-21 MED ORDER — PROPOFOL 10 MG/ML IV BOLUS
INTRAVENOUS | Status: DC | PRN
  Administered 2023-06-21: 50 mg via INTRAVENOUS
  Administered 2023-06-21: 150 mg via INTRAVENOUS

## 2023-06-21 MED ORDER — ROCURONIUM BROMIDE 10 MG/ML (PF) SYRINGE
PREFILLED_SYRINGE | INTRAVENOUS | Status: DC | PRN
  Administered 2023-06-21: 40 mg via INTRAVENOUS

## 2023-06-21 MED ORDER — ACETAMINOPHEN 325 MG PO TABS: 650.0000 mg | ORAL_TABLET | ORAL | Status: DC | PRN

## 2023-06-21 MED ORDER — ONDANSETRON HCL 4 MG/2ML IJ SOLN
INTRAMUSCULAR | Status: AC
  Filled 2023-06-21: qty 2

## 2023-06-21 MED ORDER — ALBUTEROL SULFATE (2.5 MG/3ML) 0.083% IN NEBU: 3.0000 mL | INHALATION_SOLUTION | RESPIRATORY_TRACT | Status: DC | PRN

## 2023-06-21 MED ORDER — BENZONATATE 100 MG PO CAPS: 100.0000 mg | ORAL_CAPSULE | Freq: Three times a day (TID) | ORAL | Status: DC | PRN

## 2023-06-21 MED ORDER — ONDANSETRON HCL 4 MG/2ML IJ SOLN
4.0000 mg | Freq: Four times a day (QID) | INTRAMUSCULAR | Status: DC | PRN
  Administered 2023-06-21: 4 mg via INTRAVENOUS
  Filled 2023-06-21: qty 2

## 2023-06-21 MED ORDER — SODIUM CHLORIDE 0.9 % IV SOLN: 12.5000 mg | INTRAVENOUS | Status: DC | PRN

## 2023-06-21 MED ORDER — MEPERIDINE HCL 25 MG/ML IJ SOLN
6.2500 mg | INTRAMUSCULAR | Status: DC | PRN
Start: 2023-06-21 — End: 2023-06-21

## 2023-06-21 MED ORDER — SUGAMMADEX SODIUM 200 MG/2ML IV SOLN
INTRAVENOUS | Status: DC | PRN
  Administered 2023-06-21: 100 mg via INTRAVENOUS
  Administered 2023-06-21: 200 mg via INTRAVENOUS

## 2023-06-21 MED ORDER — AZELASTINE HCL 0.1 % NA SOLN
1.0000 | Freq: Two times a day (BID) | NASAL | Status: DC | PRN
Start: 2023-06-21 — End: 2023-06-22

## 2023-06-21 MED ORDER — PANTOPRAZOLE SODIUM 40 MG PO TBEC: 40.0000 mg | DELAYED_RELEASE_TABLET | Freq: Every day | ORAL | Status: DC | PRN

## 2023-06-21 MED ORDER — METOPROLOL TARTRATE 12.5 MG HALF TABLET: 12.5000 mg | ORAL_TABLET | Freq: Two times a day (BID) | ORAL | Status: DC

## 2023-06-21 MED ORDER — OXYCODONE HCL 5 MG PO TABS: 10.0000 mg | ORAL_TABLET | ORAL | Status: DC | PRN

## 2023-06-21 MED ORDER — SODIUM CHLORIDE 0.9% FLUSH: 3.0000 mL | INTRAVENOUS | Status: DC | PRN

## 2023-06-21 MED ORDER — HYDROCODONE-ACETAMINOPHEN 5-325 MG PO TABS
1.0000 | ORAL_TABLET | ORAL | Status: DC | PRN
  Administered 2023-06-21 – 2023-06-22 (×4): 1 via ORAL
  Filled 2023-06-21 (×4): qty 1

## 2023-06-21 MED ORDER — ACETAMINOPHEN 500 MG PO TABS
1000.0000 mg | ORAL_TABLET | Freq: Once | ORAL | Status: AC
  Administered 2023-06-21: 1000 mg via ORAL
  Filled 2023-06-21: qty 2

## 2023-06-21 MED ORDER — METFORMIN HCL 500 MG PO TABS
500.0000 mg | ORAL_TABLET | Freq: Two times a day (BID) | ORAL | Status: DC
  Administered 2023-06-21 – 2023-06-22 (×2): 500 mg via ORAL
  Filled 2023-06-21 (×2): qty 1

## 2023-06-21 MED ORDER — AMISULPRIDE (ANTIEMETIC) 5 MG/2ML IV SOLN: 10.0000 mg | Freq: Once | INTRAVENOUS | Status: AC | PRN

## 2023-06-21 MED ORDER — METOPROLOL TARTRATE 12.5 MG HALF TABLET: 12.5000 mg | ORAL_TABLET | Freq: Two times a day (BID) | ORAL | Status: DC | PRN

## 2023-06-21 MED ORDER — LEVOTHYROXINE SODIUM 88 MCG PO TABS
88.0000 ug | ORAL_TABLET | Freq: Every day | ORAL | Status: DC
  Administered 2023-06-22: 88 ug via ORAL
  Filled 2023-06-21: qty 1

## 2023-06-21 MED ORDER — BUPIVACAINE HCL (PF) 0.25 % IJ SOLN
INTRAMUSCULAR | Status: AC
  Filled 2023-06-21: qty 30

## 2023-06-21 MED ORDER — OMEPRAZOLE MAGNESIUM 20 MG PO TBEC: 20.0000 mg | DELAYED_RELEASE_TABLET | ORAL | Status: DC

## 2023-06-21 MED ORDER — MAGNESIUM OXIDE -MG SUPPLEMENT 400 (240 MG) MG PO TABS
400.0000 mg | ORAL_TABLET | Freq: Every morning | ORAL | Status: DC
  Administered 2023-06-21 – 2023-06-22 (×2): 400 mg via ORAL
  Filled 2023-06-21 (×2): qty 1

## 2023-06-21 SURGICAL SUPPLY — 44 items
ADH SKN CLS APL DERMABOND .7 (GAUZE/BANDAGES/DRESSINGS) ×1
APL SKNCLS STERI-STRIP NONHPOA (GAUZE/BANDAGES/DRESSINGS) ×1
BAG COUNTER SPONGE SURGICOUNT (BAG) ×1 IMPLANT
BAG DECANTER FOR FLEXI CONT (MISCELLANEOUS) ×1 IMPLANT
BAG SPNG CNTER NS LX DISP (BAG) ×1
BENZOIN TINCTURE PRP APPL 2/3 (GAUZE/BANDAGES/DRESSINGS) ×1 IMPLANT
BUR CUTTER 7.0 ROUND (BURR) ×1 IMPLANT
BUR MATCHSTICK NEURO 3.0 LAGG (BURR) IMPLANT
CANISTER SUCT 3000ML PPV (MISCELLANEOUS) ×1 IMPLANT
DERMABOND ADVANCED .7 DNX12 (GAUZE/BANDAGES/DRESSINGS) ×1 IMPLANT
DRAPE HALF SHEET 40X57 (DRAPES) IMPLANT
DRAPE LAPAROTOMY 100X72X124 (DRAPES) ×1 IMPLANT
DRAPE MICROSCOPE SLANT 54X150 (MISCELLANEOUS) ×1 IMPLANT
DRAPE SURG 17X23 STRL (DRAPES) ×4 IMPLANT
DRSG OPSITE POSTOP 4X6 (GAUZE/BANDAGES/DRESSINGS) IMPLANT
ELECT REM PT RETURN 9FT ADLT (ELECTROSURGICAL) ×1
ELECTRODE REM PT RTRN 9FT ADLT (ELECTROSURGICAL) ×1 IMPLANT
GAUZE 4X4 16PLY ~~LOC~~+RFID DBL (SPONGE) IMPLANT
GAUZE SPONGE 4X4 12PLY STRL (GAUZE/BANDAGES/DRESSINGS) ×1 IMPLANT
GLOVE BIO SURGEON STRL SZ 6.5 (GLOVE) ×1 IMPLANT
GLOVE BIOGEL PI IND STRL 6.5 (GLOVE) ×1 IMPLANT
GLOVE ECLIPSE 9.0 STRL (GLOVE) ×1 IMPLANT
GOWN STRL REUS W/ TWL LRG LVL3 (GOWN DISPOSABLE) IMPLANT
GOWN STRL REUS W/ TWL XL LVL3 (GOWN DISPOSABLE) ×1 IMPLANT
GOWN STRL REUS W/TWL 2XL LVL3 (GOWN DISPOSABLE) IMPLANT
GOWN STRL REUS W/TWL LRG LVL3 (GOWN DISPOSABLE) ×2
GOWN STRL REUS W/TWL XL LVL3 (GOWN DISPOSABLE) ×1
KIT BASIN OR (CUSTOM PROCEDURE TRAY) ×1 IMPLANT
KIT TURNOVER KIT B (KITS) ×1 IMPLANT
NDL HYPO 22X1.5 SAFETY MO (MISCELLANEOUS) ×1 IMPLANT
NDL SPNL 22GX3.5 QUINCKE BK (NEEDLE) ×1 IMPLANT
NEEDLE HYPO 22X1.5 SAFETY MO (MISCELLANEOUS) ×1 IMPLANT
NEEDLE SPNL 22GX3.5 QUINCKE BK (NEEDLE) ×1 IMPLANT
NS IRRIG 1000ML POUR BTL (IV SOLUTION) ×1 IMPLANT
PACK LAMINECTOMY NEURO (CUSTOM PROCEDURE TRAY) ×1 IMPLANT
PAD ARMBOARD 7.5X6 YLW CONV (MISCELLANEOUS) ×3 IMPLANT
SPIKE FLUID TRANSFER (MISCELLANEOUS) ×1 IMPLANT
SPONGE SURGIFOAM ABS GEL SZ50 (HEMOSTASIS) IMPLANT
STRIP CLOSURE SKIN 1/2X4 (GAUZE/BANDAGES/DRESSINGS) ×1 IMPLANT
SUT VIC AB 2-0 CT1 18 (SUTURE) ×1 IMPLANT
SUT VIC AB 3-0 SH 8-18 (SUTURE) ×1 IMPLANT
TOWEL GREEN STERILE (TOWEL DISPOSABLE) ×1 IMPLANT
TOWEL GREEN STERILE FF (TOWEL DISPOSABLE) ×1 IMPLANT
WATER STERILE IRR 1000ML POUR (IV SOLUTION) ×1 IMPLANT

## 2023-06-21 NOTE — Anesthesia Procedure Notes (Signed)
Procedure Name: Intubation Date/Time: 06/21/2023 8:55 AM  Performed by: Loleta Soha Thorup, CRNAPre-anesthesia Checklist: Patient identified, Emergency Drugs available, Suction available and Patient being monitored Patient Re-evaluated:Patient Re-evaluated prior to induction Oxygen Delivery Method: Circle system utilized Preoxygenation: Pre-oxygenation with 100% oxygen Induction Type: IV induction Ventilation: Mask ventilation without difficulty Laryngoscope Size: Mac and 3 Grade View: Grade I Tube type: Oral Nasal Tubes: Right Tube size: 7.0 mm Number of attempts: 1 Airway Equipment and Method: Stylet and Oral airway Placement Confirmation: ETT inserted through vocal cords under direct vision, positive ETCO2 and breath sounds checked- equal and bilateral Secured at: 21 cm Tube secured with: Tape Dental Injury: Teeth and Oropharynx as per pre-operative assessment  Comments: Intubated by Angelica Chessman

## 2023-06-21 NOTE — Brief Op Note (Signed)
06/21/2023  9:35 AM  PATIENT:  [name]  67 y.o. female  PRE-OPERATIVE DIAGNOSIS:  Stenosis  POST-OPERATIVE DIAGNOSIS:  Stenosis  PROCEDURE:  Procedure(s): Laminectomy and Foraminotomy - left - Thoracic eleven-Thoracic twelve (Left)  SURGEON:  Surgeons and Role:    Julio Sicks, MD - Primary  PHYSICIAN ASSISTANT:   ASSISTANTSMarland Mcalpine   ANESTHESIA:   general  EBL:  50cc   BLOOD ADMINISTERED:none  DRAINS: none   LOCAL MEDICATIONS USED:  MARCAINE     SPECIMEN:  No Specimen  DISPOSITION OF SPECIMEN:  N/A  COUNTS:  YES  TOURNIQUET:  * No tourniquets in log *  DICTATION: .Dragon Dictation  PLAN OF CARE: Admit for overnight observation  PATIENT DISPOSITION:  PACU - hemodynamically stable.   Delay start of Pharmacological VTE agent (>24hrs) due to surgical blood loss or risk of bleeding: yes

## 2023-06-21 NOTE — Op Note (Signed)
Date of procedure: 06/21/2023  Date of dictation: Same  Service: Neurosurgery  Preoperative diagnosis: Left T11-T12 spondylosis with foraminal stenosis and chronic radiculopathy  Postoperative diagnosis: Same  Procedure Name: Left T11-T12 decompressive laminotomy and foraminotomies  Surgeon:Tameika Heckmann A.Kamaljit Hizer, M.D.  Asst. Surgeon: Doran Durand, NP  Anesthesia: General  Indication: 67 year old female with chronic left lower thoracic pain with radiation to her lower chest wall and abdomen failing conservative management.  Workup demonstrates evidence of multilevel thoracic disc degeneration with degenerative disc space collapse and degenerative scoliosis.  She has significant foraminal stenosis in the left at T11-T12.  She underwent selective nerve root block with good relief of her symptoms and presents now for decompressive surgery in hopes of improving her symptoms.  Operative note: After induction of anesthesia, patient position prone onto bolsters where she was appropriately padded.  Patient's thoracic region prepped and draped sterilely.  Incision made overlying T11-T12.  Dissection performed on the left.  Retractor placed.  X-ray taken.  Level confirmed.  Laminotomy then performed using high-speed drill and Kerrison rongeurs to remove the inferior aspect lamina of T11 the medial aspect of the T11-12 facet joint and the superior rim of the T12 lamina.  Ligament flavum elevated and partially resected.  Underlying thecal sac and exiting left T11 nerve root was identified.  Microscope brought in field used for microdissection spinal canal.  Foraminotomies was then completed along the course the exiting T11 nerve root using Kerrison rongeurs and microdissectors.  A significant dorsal spur/synovial cyst was resected.  At this point a very thorough decompression upon the nerve root had been achieved.  There was no evidence of injury to the thecal sac or nerve roots.  The wound was then irrigated.  Gelfoam was  placed topically for hemostasis.  Wounds then closed in layers with Vicryl sutures.  Steri-Strips and sterile dressing were applied.  No apparent complications.  Patient tolerated the procedure well and she returns to the recovery room postop.

## 2023-06-21 NOTE — Transfer of Care (Addendum)
Immediate Anesthesia Transfer of Care Note  Patient: Tara [name]  Procedure(s) Performed: Laminectomy and Foraminotomy - left - Thoracic eleven-Thoracic twelve (Left: Back)  Patient Location: PACU  Anesthesia Type:General  Level of Consciousness: awake and alert   Airway & Oxygen Therapy: Patient Spontanous Breathing  Post-op Assessment: Report given to RN and Post -op Vital signs reviewed and stable  Post vital signs: Reviewed and stable  Last Vitals:  Vitals Value Taken Time  BP 139/80 06/21/23 0949  Temp 36.3 C 06/21/23 0949  Pulse 85 06/21/23 0956  Resp 29 06/21/23 0956  SpO2 98 % 06/21/23 0956  Vitals shown include unfiled device data.  Last Pain:  Vitals:   06/21/23 0640  TempSrc:   PainSc: 0-No pain         Complications: No notable events documented.

## 2023-06-21 NOTE — H&P (Signed)
Tara [name] is an 67 y.o. female.   Chief Complaint: Back pain HPI: 67 year old female with chronic left lower thoracic radicular pain failing conservative management workup demonstrates evidence of significant left-sided  T11 T12 spondylosis with foraminal stenosis.  Patient has improved with selective nerve root block and presents now for decompressive surgery at T11-12.  Past Medical History:  Diagnosis Date   Allergy    Anxiety    Arthritis    Back pain    Chronic edema    Chronic hepatitis C without hepatic coma (HCC) 10/29/2012   Chronic pain syndrome 07/01/2018   Cirrhosis (HCC) 05/10/2017   Class 2 severe obesity with serious comorbidity and body mass index (BMI) of 37.0 to 37.9 in adult (HCC) 10/25/2018   Cold hands and feet    Complication of anesthesia    difficulty waking up/dizzy/lightheaded   Controlled type 2 diabetes mellitus without complication, without long-term current use of insulin (HCC) 07/11/2018   Cough    Dry eye syndrome of both eyes 09/02/2022   Dysrhythmia, cardiac 09/28/2011   She has no records with her today. She tells me that a few years ago in [place] she had palpitations and a cardiology placed her on digoxin, she does not know what type of dysrhythmia. She has not had any palpitations recently     Environmental allergies    Eye pain    Fatigue    Gastroesophageal reflux disease 11/05/2015   H/O varicella    H/O varicose veins    Hepatic cirrhosis (HCC)    Hepatitis C    History of breast cancer 01/15/2020   History of cardiac arrhythmia 01/15/2020   History of chemotherapy 2013   left breast cancer   History of hepatitis C 01/15/2020   History of left hip replacement 08/29/2018   History of measles, mumps, or rubella    History of osteoporosis 01/15/2020   History of total right hip replacement 08/29/2018   History of TTP (thrombotic thrombocytopenic purpura) 10/29/2012   Hx of radiation therapy 12/15/11 - 01/29/12   left breast    Hyperlipidemia    Hypokalemia 12/29/2013   Hypothyroidism    Intrinsic atopic dermatitis 01/15/2020   Irregular heart beat    under control   Joint pain    Leg cramps    Lymphedema of arm 12/15/2011   Malignant neoplasm of axillary tail of left breast in female, estrogen receptor positive (HCC) 08/12/2015   Malignant neoplasm of upper-outer quadrant of left breast in female, estrogen receptor positive (HCC) 07/17/2014   Moderate persistent asthma without complication 01/15/2020   Neuromuscular disorder (HCC)    left foot nerve damage- neuropathy    Oral candidiasis 09/02/2022   Osteopenia    Other allergic rhinitis 10/29/2012   Palpitations    Prediabetes 10/25/2018   Radiculopathy 07/2020   Rheumatoid arthritis (HCC)    Ringing in ear    Rosacea, acne 09/28/2011   Severe persistent asthma without complication 09/02/2022   Status post bilateral hip replacements 10/29/2012   Swallowing difficulty    Swelling of both lower extremities    Thrombocytopenia, primary (HCC)    TTP (thrombotic thrombocytopenic purpura) (HCC)    1982   Vitamin D deficiency 10/25/2018    Past Surgical History:  Procedure Laterality Date   BREAST LUMPECTOMY  08/03/11   left lumpectomy and slnbx,T1cN0,triple pos   CATARACT EXTRACTION, BILATERAL     COLONOSCOPY  01/26/2019   COLONOSCOPY  08/12/2020   elbow pins Left  FOOT SURGERY Left    multiple   POLYPECTOMY     PORTACATH PLACEMENT  08/03/2011   Procedure: INSERTION PORT-A-CATH;  Surgeon: Robyne Askew, MD;  Location: [facility] OR;  Service: General;  Laterality: Right;   portacath removal     TOTAL HIP ARTHROPLASTY Bilateral    UPPER GASTROINTESTINAL ENDOSCOPY     UPPER GASTROINTESTINAL ENDOSCOPY  08/12/2020    Family History  Problem Relation Age of Onset   Pneumonia Mother    COPD Mother    Colon polyps Mother    Heart disease Mother    Thyroid disease Mother    Obesity Mother    COPD Father    Obesity Father    Brain cancer Maternal  Grandfather    Alcohol abuse Maternal Grandfather    Hyperlipidemia Neg Hx    Hypertension Neg Hx    Colon cancer Neg Hx    Rectal cancer Neg Hx    Stomach cancer Neg Hx    Esophageal cancer Neg Hx    Social History:  reports that she has never smoked. She has never used smokeless tobacco. She reports current alcohol use of about 1.0 standard drink of alcohol per week. She reports that she does not use drugs.  Allergies:  Allergies  Allergen Reactions   Aspirin Other (See Comments)    Pt has a hx of TTP.    Nsaids Other (See Comments)    Pt has a hx of TTP.     Facility-Administered Medications Prior to Admission  Medication Dose Route Frequency Provider Last Rate Last Admin   dupilumab (DUPIXENT) prefilled syringe 300 mg  300 mg Subcutaneous Q14 Days Ferol Luz, MD   300 mg at 05/19/23 1112   Medications Prior to Admission  Medication Sig Dispense Refill   acetaminophen (TYLENOL) 500 MG tablet Take 500 mg by mouth every 6 (six) hours as needed for mild pain, moderate pain or headache.      azelastine (ASTELIN) 0.1 % nasal spray Place 1-2 sprays into both nostrils 2 (two) times daily as needed. 30 mL 5   benzonatate (TESSALON) 100 MG capsule Take 1 capsule (100 mg total) by mouth 3 (three) times daily as needed. 30 capsule 0   diphenhydramine-acetaminophen (TYLENOL PM) 25-500 MG TABS tablet Take 1 tablet by mouth at bedtime as needed (pain/sleep.).     famotidine (PEPCID) 20 MG tablet Take 1 tablet two times a day.  Patient needs follow up appointment for future refills. Please call [phone] to schedule an appointment. 112 tablet 1   gabapentin (NEURONTIN) 100 MG capsule Take 100 mg by mouth 3 (three) times daily as needed (pain.).     Glucosamine-Chondroitin (MOVE FREE PO) Take 1 tablet by mouth in the morning.     levothyroxine (SYNTHROID) 88 MCG tablet Take 1 tablet (88 mcg total) by mouth daily. 90 tablet 0   magnesium oxide (MAG-OX) 400 (240 Mg) MG tablet Take 400 mg  by mouth in the morning.     metFORMIN (GLUCOPHAGE) 500 MG tablet Take 1 tablet (500 mg total) by mouth 2 (two) times daily with a meal. 180 tablet 2   montelukast (SINGULAIR) 10 MG tablet TAKE 1 TABLET BY MOUTH AT BEDTIME 90 tablet 1   omeprazole (PRILOSEC OTC) 20 MG tablet Take 20 mg by mouth daily as needed.     oxyCODONE (OXY IR/ROXICODONE) 5 MG immediate release tablet Take 5 mg by mouth every 6 (six) hours as needed (pain.).     Probiotic  Product (PROBIOTIC PO) Take 1 capsule by mouth in the morning.     rosuvastatin (CRESTOR) 5 MG tablet TAKE ONE TABLET BY MOUTH DAILY 90 tablet 3   ADVAIR HFA 45-21 MCG/ACT inhaler Inhale 2 puffs into the lungs 2 (two) times daily. 1 each 5   albuterol (VENTOLIN HFA) 108 (90 Base) MCG/ACT inhaler Two puffs every 4 hours if needed for wheezing or coughing .  May use 2 puffs 5-15 minutes prior to exercise. 18 g 1   clobetasol ointment (TEMOVATE) 0.05 % Apply 1 Application topically 2 (two) times daily. 30 g 0   metoprolol tartrate (LOPRESSOR) 25 MG tablet Take 0.5 tablets (12.5 mg total) by mouth 2 (two) times daily. 90 tablet 3   nystatin (MYCOSTATIN) 100000 UNIT/ML suspension Use as directed 5 mLs in the mouth or throat 4 (four) times daily as needed (thrush/irritation (due to inhaler use)).     Polyethyl Glycol-Propyl Glycol (SYSTANE) 0.4-0.3 % SOLN Place 1-2 drops into both eyes 3 (three) times daily as needed (dry/irritated eyes.).     Spacer/Aero-Holding Chambers (AEROCHAMBER MV) inhaler Use as instructed 1 each 2   triamcinolone ointment (KENALOG) 0.1 % Apply 1 Application topically 2 (two) times daily. 30 g 0    No results found. However, due to the size of the patient record, not all encounters were searched. Please check Results Review for a complete set of results. No results found.  Pertinent items noted in HPI and remainder of comprehensive ROS otherwise negative.  Blood pressure (!) 162/85, pulse 96, temperature 97.8 F (36.6 C), temperature  source Oral, resp. rate 18, height 5\' 6"  (1.676 m), weight 115.7 kg, SpO2 96%.  Patient is awake and alert.  She is oriented and appropriate.  Speech is fluent.  Judgment insight are intact.  Cranial nerve function normal by.  Motor examination reveals intact motor strength bilaterally sensory examination nonfocal.  Deep tendon rhexis hypoactive but symmetric.  No evidence of long track signs.  Gait antalgic.  Patient with head ears eyes nose and throat is unremarked.  Chest and abdomen are benign.  Extremities are free from injury deformity. Assessment/Plan Left T11-T12 spondylosis with foraminal stenosis and radiculopathy.  Plan left T11-T12 decompressive laminotomy and foraminotomies.  Risks and benefits of been explained.  Patient wishes to proceed.  Brinna Divelbiss A Chelbi Herber 06/21/2023, 8:04 AM  \

## 2023-06-21 NOTE — Anesthesia Postprocedure Evaluation (Signed)
Anesthesia Post Note  Patient: Tara [name]  Procedure(s) Performed: Laminectomy and Foraminotomy - left - Thoracic eleven-Thoracic twelve (Left: Back)     Patient location during evaluation: PACU Anesthesia Type: General Level of consciousness: awake and alert Pain management: pain level controlled Vital Signs Assessment: post-procedure vital signs reviewed and stable Respiratory status: spontaneous breathing, nonlabored ventilation and respiratory function stable Cardiovascular status: blood pressure returned to baseline and stable Postop Assessment: no apparent nausea or vomiting Anesthetic complications: no   No notable events documented.  Last Vitals:  Vitals:   06/21/23 1030 06/21/23 1104  BP: (!) 149/90 (!) 144/73  Pulse: 90 86  Resp: 18 17  Temp: 36.9 C 36.7 C  SpO2: 96% 94%    Last Pain:  Vitals:   06/21/23 1030  TempSrc:   PainSc: 3                  Lowella Curb

## 2023-06-22 ENCOUNTER — Encounter (HOSPITAL_COMMUNITY): Payer: Self-pay | Admitting: Neurosurgery

## 2023-06-22 DIAGNOSIS — M4724 Other spondylosis with radiculopathy, thoracic region: Secondary | ICD-10-CM | POA: Diagnosis not present

## 2023-06-22 LAB — GLUCOSE, CAPILLARY: Glucose-Capillary: 102 mg/dL — ABNORMAL HIGH (ref 70–99)

## 2023-06-22 MED ORDER — CYCLOBENZAPRINE HCL 10 MG PO TABS: 10.0000 mg | ORAL_TABLET | Freq: Three times a day (TID) | ORAL | 0 refills | Status: DC | PRN

## 2023-06-22 MED ORDER — OXYCODONE HCL 5 MG PO TABS: 5.0000 mg | ORAL_TABLET | Freq: Four times a day (QID) | ORAL | 0 refills | Status: DC | PRN

## 2023-06-22 MED FILL — Thrombin For Soln 5000 Unit: CUTANEOUS | Qty: 2 | Status: AC

## 2023-06-22 NOTE — Discharge Instructions (Addendum)
Wound Care Keep incision covered and dry for three days.  Do not put any creams, lotions, or ointments on incision. Leave steri-strips on back.  They will fall off by themselves. Activity Walk each and every day, increasing distance each day. No lifting greater than 8 lbs.  Avoid excessive back bending No driving for 2 weeks; may ride as a passenger locally.  Diet Resume your normal diet.   Call Your Doctor If Any of These Occur Redness, drainage, or swelling at the wound.  Temperature greater than 101 degrees. Severe pain not relieved by pain medication. Incision starts to come apart. Follow Up Appt Call  [phone])  for problems.  If you have any hardware placed in your spine, you will need an x-ray before your appointment.

## 2023-06-22 NOTE — Evaluation (Signed)
Physical Therapy Evaluation  Patient Details Name: Tara [name] MRN: [id number] DOB: 1955/12/05 Today's Date: 06/22/2023  History of Present Illness  Pt is a 67 y/o F s/p L T11-12 decompressive laminectomy and foraminectomies. PMH includes allergy, anxiety, lymphedema, hepatitis C, obesity, GERD, breast CA, TTP, R THA, HLD, prediabetes, hypothyroidism   Clinical Impression  Pt admitted with above diagnosis. At the time of PT eval, pt was able to demonstrate transfers and ambulation with gross CGA to min assist and RW for support. Pt was educated on precautions, positioning recommendations, appropriate activity progression, and car transfer. Pt currently with functional limitations due to the deficits listed below (see PT Problem List). Pt will benefit from skilled PT to increase their independence and safety with mobility to allow discharge to the venue listed below.          If plan is discharge home, recommend the following: A little help with walking and/or transfers;A little help with bathing/dressing/bathroom;Assistance with cooking/housework;Assist for transportation;Help with stairs or ramp for entrance   Can travel by private vehicle        Equipment Recommendations None recommended by PT  Recommendations for Other Services       Functional Status Assessment Patient has had a recent decline in their functional status and demonstrates the ability to make significant improvements in function in a reasonable and predictable amount of time.     Precautions / Restrictions Precautions Precautions: Back Precaution Booklet Issued: Yes (comment) Precaution Comments: educated on 3/3 back prec Required Braces or Orthoses:  (no brace needed per orders) Restrictions Weight Bearing Restrictions: No      Mobility  Bed Mobility Overal bed mobility: Needs Assistance Bed Mobility: Sit to Sidelying, Rolling Rolling: Contact guard assist       Sit to sidelying: Min assist General  bed mobility comments: Pt required assist for LE elevation up into bed at end of session. Pt with poor ability to come to sidelying before initiating roll. HOB flat and rails lowered to simulate home environment.    Transfers Overall transfer level: Needs assistance Equipment used: Rolling walker (2 wheels) Transfers: Sit to/from Stand Sit to Stand: Contact guard assist           General transfer comment: VC's for hand placement on seated surface for safety. No assist required for power up to full stand however close guard provided for safety.    Ambulation/Gait Ambulation/Gait assistance: Contact guard assist Gait Distance (Feet): 100 Feet Assistive device: Rolling walker (2 wheels) Gait Pattern/deviations: Step-through pattern, Decreased stride length, Trunk flexed Gait velocity: Decreased Gait velocity interpretation: 1.31 - 2.62 ft/sec, indicative of limited community ambulator   General Gait Details: VC's for improved posture, closer walker proximity and forward gaze. No assist required but close guard provided for safety.  Stairs Stairs: Yes Stairs assistance: Min assist Stair Management: No rails, Forwards, With walker Number of Stairs: 1 General stair comments: Assist for walker management. Pt was able to negotiate the portable step to ascend and descend to simulate home environment.  Wheelchair Mobility     Tilt Bed    Modified Rankin (Stroke Patients Only)       Balance Overall balance assessment: Needs assistance Sitting-balance support: Feet supported Sitting balance-Leahy Scale: Good     Standing balance support: During functional activity, Reliant on assistive device for balance Standing balance-Leahy Scale: Poor Standing balance comment: reliant on external support  Pertinent Vitals/Pain Pain Assessment Pain Assessment: Faces Faces Pain Scale: Hurts little more Pain Location: R side Pain Descriptors /  Indicators: Discomfort Pain Intervention(s): Limited activity within patient's tolerance, Monitored during session, Repositioned    Home Living Family/patient expects to be discharged to:: Private residence Living Arrangements: Spouse/significant other Available Help at Discharge: Family;Available 24 hours/day Type of Home: House Home Access: Stairs to enter   [employer] of Steps: 1 (Per husband threshold step)   Home Layout: One level Home Equipment: Agricultural consultant (2 wheels);Shower seat;Adaptive equipment      Prior Function Prior Level of Function : Independent/Modified Independent             Mobility Comments: no AD use ADLs Comments: assist for dressing     Extremity/Trunk Assessment   Upper Extremity Assessment Upper Extremity Assessment: Defer to OT evaluation    Lower Extremity Assessment Lower Extremity Assessment: Generalized weakness    Cervical / Trunk Assessment Cervical / Trunk Assessment: Back Surgery;Other exceptions Cervical / Trunk Exceptions: Forward head posture with rounded shoulders  Communication   Communication Communication: No apparent difficulties  Cognition Arousal: Alert Behavior During Therapy: WFL for tasks assessed/performed Overall Cognitive Status: Within Functional Limits for tasks assessed                                          General Comments General comments (skin integrity, edema, etc.): VSS    Exercises     Assessment/Plan    PT Assessment Patient needs continued PT services  PT Problem List Decreased strength;Decreased activity tolerance;Decreased balance;Decreased mobility;Decreased knowledge of use of DME;Decreased safety awareness;Decreased knowledge of precautions;Pain       PT Treatment Interventions DME instruction;Gait training;Stair training;Therapeutic activities;Functional mobility training;Therapeutic exercise;Balance training;Neuromuscular re-education;Patient/family  education    PT Goals (Current goals can be found in the Care Plan section)  Acute Rehab PT Goals Patient Stated Goal: Return to working out with her personal trainer PT Goal Formulation: With patient/family Time For Goal Achievement: 06/29/23 Potential to Achieve Goals: Good    Frequency Min 5X/week     Co-evaluation               AM-PAC PT "6 Clicks" Mobility  Outcome Measure Help needed turning from your back to your side while in a flat bed without using bedrails?: A Little Help needed moving from lying on your back to sitting on the side of a flat bed without using bedrails?: A Little Help needed moving to and from a bed to a chair (including a wheelchair)?: A Little Help needed standing up from a chair using your arms (e.g., wheelchair or bedside chair)?: A Little Help needed to walk in hospital room?: A Little Help needed climbing 3-5 steps with a railing? : A Little 6 Click Score: 18    End of Session Equipment Utilized During Treatment: Gait belt Activity Tolerance: Patient tolerated treatment well Patient left: in bed;with call bell/phone within reach;with family/visitor present Nurse Communication: Mobility status PT Visit Diagnosis: Unsteadiness on feet (R26.81);Pain Pain - part of body:  (back)    Time: 1016-1040 PT Time Calculation (min) (ACUTE ONLY): 24 min   Charges:   PT Evaluation $PT Eval Low Complexity: 1 Low PT Treatments $Gait Training: 8-22 mins PT General Charges $$ ACUTE PT VISIT: 1 Visit         Conni Slipper, PT, DPT Acute Rehabilitation Services Secure  Chat Preferred Office: [phone]   Marylynn Pearson 06/22/2023, 11:40 AM

## 2023-06-22 NOTE — Evaluation (Signed)
Occupational Therapy Evaluation Patient Details Name: Tara [name] MRN: [id number] DOB: [birth year] Today's Date: 06/22/2023   History of Present Illness Pt is a 67 y/o F s/p L T11-12 decompressive laminectomy and foraminectomies. PMH includes allergy, anxiety, lymphedema, hepatitis C, obesity, GERD, breast CA, TTP, R THA, HLD, prediabetes, hypothyroidism   Clinical Impression   Pt reports having assist at baseline with ADLs, ind with mobility but does not report ambulating longer distances at home. Pt lives with spouse who can assist at d/c. Pt currenlty set up -mod A for ADLs, min A for bed mobility and min A for transfers with RW. Pt educated on compensatory strategies for ADLs, bed mobility and back precautions, also educated on use of AE. Pt presenting with impairments listed below, will follow acutely. Recommend HHOT at d/c.        If plan is discharge home, recommend the following: A little help with walking and/or transfers;A lot of help with bathing/dressing/bathroom;Assistance with cooking/housework;Help with stairs or ramp for entrance;Supervision due to cognitive status;Assist for transportation    Functional Status Assessment  Patient has had a recent decline in their functional status and demonstrates the ability to make significant improvements in function in a reasonable and predictable amount of time.  Equipment Recommendations  None recommended by OT (pt has all needed DME)    Recommendations for Other Services PT consult     Precautions / Restrictions Precautions Precautions: Back Precaution Booklet Issued: Yes (comment) Precaution Comments: educated on 3/3 back prec Required Braces or Orthoses:  (no brace needed per orders) Restrictions Weight Bearing Restrictions: No      Mobility Bed Mobility Overal bed mobility: Needs Assistance Bed Mobility: Sidelying to Sit, Sit to Sidelying, Rolling Rolling: Min assist Sidelying to sit: Min assist     Sit to  sidelying: Min assist      Transfers Overall transfer level: Needs assistance Equipment used: Rolling walker (2 wheels) Transfers: Sit to/from Stand Sit to Stand: Min assist                  Balance Overall balance assessment: Needs assistance Sitting-balance support: Feet supported Sitting balance-Leahy Scale: Good     Standing balance support: During functional activity, Reliant on assistive device for balance Standing balance-Leahy Scale: Poor Standing balance comment: reliant on external support                           ADL either performed or assessed with clinical judgement   ADL Overall ADL's : Needs assistance/impaired Eating/Feeding: Set up;Sitting   Grooming: Set up;Sitting   Upper Body Bathing: Minimal assistance;Sitting   Lower Body Bathing: Sitting/lateral leans;Moderate assistance   Upper Body Dressing : Minimal assistance   Lower Body Dressing: Moderate assistance;Sitting/lateral leans   Toilet Transfer: Minimal assistance;Ambulation;Regular Toilet;Rolling walker (2 wheels)   Toileting- Clothing Manipulation and Hygiene: Supervision/safety       Functional mobility during ADLs: Rolling walker (2 wheels);Minimal assistance       Vision   Vision Assessment?: No apparent visual deficits     Perception Perception: Not tested       Praxis Praxis: Not tested       Pertinent Vitals/Pain Pain Assessment Pain Assessment: Faces Pain Score: 4  Faces Pain Scale: Hurts little more Pain Location: R side Pain Descriptors / Indicators: Discomfort Pain Intervention(s): Limited activity within patient's tolerance, Monitored during session, Repositioned     Extremity/Trunk Assessment Upper Extremity Assessment Upper Extremity Assessment: Generalized  weakness (LUE lymphedema)   Lower Extremity Assessment Lower Extremity Assessment: Defer to PT evaluation       Communication Communication Communication: No apparent  difficulties   Cognition Arousal: Alert Behavior During Therapy: WFL for tasks assessed/performed Overall Cognitive Status: Within Functional Limits for tasks assessed                                       General Comments  VSS    Exercises     Shoulder Instructions      Home Living Family/patient expects to be discharged to:: Private residence Living Arrangements: Spouse/significant other Available Help at Discharge: Family;Available 24 hours/day Type of Home: House Home Access: Stairs to enter [employer] of Steps: 2   Home Layout: One level     Bathroom Shower/Tub: Walk-in shower         Home Equipment: Agricultural consultant (2 wheels);Shower seat;Adaptive equipment Adaptive Equipment: Sock aid;Reacher        Prior Functioning/Environment Prior Level of Function : Independent/Modified Independent             Mobility Comments: no AD use ADLs Comments: assist for dressing        OT Problem List: Decreased strength;Decreased range of motion;Decreased activity tolerance;Impaired balance (sitting and/or standing);Decreased knowledge of precautions      OT Treatment/Interventions: Self-care/ADL training;Therapeutic exercise;Energy conservation;DME and/or AE instruction;Therapeutic activities;Patient/family education;Balance training    OT Goals(Current goals can be found in the care plan section) Acute Rehab OT Goals Patient Stated Goal: none stated OT Goal Formulation: With patient Time For Goal Achievement: 07/06/23 Potential to Achieve Goals: Good  OT Frequency: Min 1X/week    Co-evaluation              AM-PAC OT "6 Clicks" Daily Activity     Outcome Measure Help from another person eating meals?: None Help from another person taking care of personal grooming?: A Little Help from another person toileting, which includes using toliet, bedpan, or urinal?: A Little Help from another person bathing (including washing, rinsing,  drying)?: A Lot Help from another person to put on and taking off regular upper body clothing?: A Little Help from another person to put on and taking off regular lower body clothing?: A Lot 6 Click Score: 17   End of Session Equipment Utilized During Treatment: Gait belt;Rolling walker (2 wheels) Nurse Communication: Mobility status  Activity Tolerance: Patient tolerated treatment well Patient left: in bed;with call bell/phone within reach;with family/visitor present  OT Visit Diagnosis: Unsteadiness on feet (R26.81);Other abnormalities of gait and mobility (R26.89);Muscle weakness (generalized) (M62.81)                Time: 4098-1191 OT Time Calculation (min): 38 min Charges:  OT General Charges $OT Visit: 1 Visit OT Evaluation $OT Eval Low Complexity: 1 Low OT Treatments $Self Care/Home Management : 23-37 mins  Carver Fila, OTD, OTR/L SecureChat Preferred Acute Rehab [phone]   Kenyan Karnes K Koonce 06/22/2023, 9:08 AM

## 2023-06-22 NOTE — Discharge Summary (Signed)
Physician Discharge Summary  Patient ID: Tara [name] MRN: [id number] DOB/AGE: Aug 17, 1956 67 y.o.  Admit date: 06/21/2023 Discharge date: 06/22/2023  Admission Diagnoses:  Discharge Diagnoses:  Principal Problem:   Thoracic radiculopathy   Discharged Condition: good  Hospital Course: Patient admitted to the hospital where she underwent uncomplicated a left-sided T11-T12 decompressive surgery.  Postoperatively doing well.  Preoperative back and radicular symptoms relieved.  Standing ambulating and voiding without difficulty.  Ready for discharge home.  Consults:   Significant Diagnostic Studies:   Treatments:   Discharge Exam: Blood pressure (!) 113/55, pulse 89, temperature (!) 97.4 F (36.3 C), temperature source Oral, resp. rate 16, height 5\' 6"  (1.676 m), weight 115.7 kg, SpO2 98%. Awake and alert.  Oriented and appropriate.  Motor and sensory function intact.  Wound clean and dry.  Chest and abdomen benign.  Disposition: Discharge disposition: 01-Home or Self Care        Allergies as of 06/22/2023       Reactions   Aspirin Other (See Comments)   Pt has a hx of TTP.    Nsaids Other (See Comments)   Pt has a hx of TTP.         Medication List     TAKE these medications    acetaminophen 500 MG tablet Commonly known as: TYLENOL Take 500 mg by mouth every 6 (six) hours as needed for mild pain, moderate pain or headache.   Advair HFA 45-21 MCG/ACT inhaler Generic drug: fluticasone-salmeterol Inhale 2 puffs into the lungs 2 (two) times daily.   AeroChamber MV inhaler Use as instructed   albuterol 108 (90 Base) MCG/ACT inhaler Commonly known as: VENTOLIN HFA Two puffs every 4 hours if needed for wheezing or coughing .  May use 2 puffs 5-15 minutes prior to exercise.   azelastine 0.1 % nasal spray Commonly known as: ASTELIN Place 1-2 sprays into both nostrils 2 (two) times daily as needed.   benzonatate 100 MG capsule Commonly known as:  TESSALON Take 1 capsule (100 mg total) by mouth 3 (three) times daily as needed.   clobetasol ointment 0.05 % Commonly known as: TEMOVATE Apply 1 Application topically 2 (two) times daily.   cyclobenzaprine 10 MG tablet Commonly known as: FLEXERIL Take 1 tablet (10 mg total) by mouth 3 (three) times daily as needed for muscle spasms.   diphenhydramine-acetaminophen 25-500 MG Tabs tablet Commonly known as: TYLENOL PM Take 1 tablet by mouth at bedtime as needed (pain/sleep.).   famotidine 20 MG tablet Commonly known as: PEPCID Take 1 tablet two times a day.  Patient needs follow up appointment for future refills. Please call [phone] to schedule an appointment.   gabapentin 100 MG capsule Commonly known as: NEURONTIN Take 100 mg by mouth 3 (three) times daily as needed (pain.).   levothyroxine 88 MCG tablet Commonly known as: SYNTHROID Take 1 tablet (88 mcg total) by mouth daily.   magnesium oxide 400 (240 Mg) MG tablet Commonly known as: MAG-OX Take 400 mg by mouth in the morning.   metFORMIN 500 MG tablet Commonly known as: GLUCOPHAGE Take 1 tablet (500 mg total) by mouth 2 (two) times daily with a meal.   metoprolol tartrate 25 MG tablet Commonly known as: LOPRESSOR Take 0.5 tablets (12.5 mg total) by mouth 2 (two) times daily. What changed:  when to take this reasons to take this   montelukast 10 MG tablet Commonly known as: SINGULAIR TAKE 1 TABLET BY MOUTH AT BEDTIME   MOVE FREE PO  Take 1 tablet by mouth in the morning.   nystatin 100000 UNIT/ML suspension Commonly known as: MYCOSTATIN Use as directed 5 mLs in the mouth or throat 4 (four) times daily as needed (thrush/irritation (due to inhaler use)).   omeprazole 20 MG tablet Commonly known as: PRILOSEC OTC Take 20 mg by mouth daily as needed.   oxyCODONE 5 MG immediate release tablet Commonly known as: Oxy IR/ROXICODONE Take 1 tablet (5 mg total) by mouth every 6 (six) hours as needed (pain.).    PROBIOTIC PO Take 1 capsule by mouth in the morning.   rosuvastatin 5 MG tablet Commonly known as: CRESTOR TAKE ONE TABLET BY MOUTH DAILY   Systane 0.4-0.3 % Soln Generic drug: Polyethyl Glycol-Propyl Glycol Place 1-2 drops into both eyes 3 (three) times daily as needed (dry/irritated eyes.).   triamcinolone ointment 0.1 % Commonly known as: KENALOG Apply 1 Application topically 2 (two) times daily.         Signed: Kathaleen Maser Tara [name] 06/22/2023, 11:00 AM

## 2023-06-28 ENCOUNTER — Ambulatory Visit (INDEPENDENT_AMBULATORY_CARE_PROVIDER_SITE_OTHER): Payer: Medicare Other

## 2023-06-28 DIAGNOSIS — J4550 Severe persistent asthma, uncomplicated: Secondary | ICD-10-CM | POA: Diagnosis not present

## 2023-06-28 MED ORDER — BENRALIZUMAB 30 MG/ML ~~LOC~~ SOSY
30.0000 mg | PREFILLED_SYRINGE | SUBCUTANEOUS | Status: AC
  Administered 2023-06-28 – 2024-09-15 (×10): 30 mg via SUBCUTANEOUS

## 2023-07-13 ENCOUNTER — Other Ambulatory Visit (HOSPITAL_BASED_OUTPATIENT_CLINIC_OR_DEPARTMENT_OTHER): Payer: Medicare Other

## 2023-07-26 ENCOUNTER — Ambulatory Visit (INDEPENDENT_AMBULATORY_CARE_PROVIDER_SITE_OTHER): Payer: Medicare Other

## 2023-07-26 DIAGNOSIS — J4550 Severe persistent asthma, uncomplicated: Secondary | ICD-10-CM

## 2023-07-27 NOTE — Addendum Note (Signed)
Addended by: Devoria Glassing on: 07/27/2023 10:06 AM   Modules accepted: Orders

## 2023-07-31 ENCOUNTER — Other Ambulatory Visit: Payer: Self-pay | Admitting: Internal Medicine

## 2023-08-02 NOTE — Progress Notes (Signed)
Patient Care Team: [name], MD as PCP - General (Internal Medicine) Shon Millet, MD as Consulting Physician (Ophthalmology)  Visit Date: 08/09/23  Subjective:    Patient ID: Tara [name] , Female   DOB: 11/04/1955, 67 y.o.    MRN: [id number]   67 y.o. Female presents today for a 75-month follow up appointment. Has a complex medical history.  Status post left laminectomy and foraminotomy T11-T12 by Dr. Julio Sicks in October 2024. She is not having back pain at this time.  History of bilateral total hip arthroplasty. She has occasional pain in hips in the morning and plans to schedule a consult with orthopedics.  She sees Allergist for moderate persistent asthma and allergic rhinitis treated with Ventolin inhaler, Advair inhaler, montelukast, dupilumab 300 mg every 14 days, benralizumab.  She has a history of atopic dermatitis.  History of oral thrush and has been treated with nystatin orally.   History of dysrhythmia that was treated in the past with digoxin.   History of GERD treated with famotidine 20 mg twice daily, omeprazole 20 mg twice daily before meals.   History of hyperlipidemia treated with rosuvastatin 5 mg daily. Lipid panel normal. CHOL elevated at 205, HDL at 85. Liver functions normal.  TSH normal at 2.73.  Past Medical History:  Diagnosis Date   Allergy    Anxiety    Arthritis    Back pain    Chronic edema    Chronic hepatitis C without hepatic coma (HCC) 10/29/2012   Chronic pain syndrome 07/01/2018   Cirrhosis (HCC) 05/10/2017   Class 2 severe obesity with serious comorbidity and body mass index (BMI) of 37.0 to 37.9 in adult (HCC) 10/25/2018   Cold hands and feet    Complication of anesthesia    difficulty waking up/dizzy/lightheaded   Controlled type 2 diabetes mellitus without complication, without long-term current use of insulin (HCC) 07/11/2018   Cough    Dry eye syndrome of both eyes 09/02/2022   Dysrhythmia, cardiac 09/28/2011   She  has no records with her today. She tells me that a few years ago in [place] she had palpitations and a cardiology placed her on digoxin, she does not know what type of dysrhythmia. She has not had any palpitations recently     Environmental allergies    Eye pain    Fatigue    Gastroesophageal reflux disease 11/05/2015   H/O varicella    H/O varicose veins    Hepatic cirrhosis (HCC)    Hepatitis C    History of breast cancer 01/15/2020   History of cardiac arrhythmia 01/15/2020   History of chemotherapy 2013   left breast cancer   History of hepatitis C 01/15/2020   History of left hip replacement 08/29/2018   History of measles, mumps, or rubella    History of osteoporosis 01/15/2020   History of total right hip replacement 08/29/2018   History of TTP (thrombotic thrombocytopenic purpura) 10/29/2012   Hx of radiation therapy 12/15/11 - 01/29/12   left breast   Hyperlipidemia    Hypokalemia 12/29/2013   Hypothyroidism    Intrinsic atopic dermatitis 01/15/2020   Irregular heart beat    under control   Joint pain    Leg cramps    Lymphedema of arm 12/15/2011   Malignant neoplasm of axillary tail of left breast in female, estrogen receptor positive (HCC) 08/12/2015   Malignant neoplasm of upper-outer quadrant of left breast in female, estrogen receptor positive (HCC) 07/17/2014  Moderate persistent asthma without complication 01/15/2020   Neuromuscular disorder (HCC)    left foot nerve damage- neuropathy    Oral candidiasis 09/02/2022   Osteopenia    Other allergic rhinitis 10/29/2012   Palpitations    Prediabetes 10/25/2018   Radiculopathy 07/2020   Rheumatoid arthritis (HCC)    Ringing in ear    Rosacea, acne 09/28/2011   Severe persistent asthma without complication 09/02/2022   Status post bilateral hip replacements 10/29/2012   Swallowing difficulty    Swelling of both lower extremities    Thrombocytopenia, primary (HCC)    TTP (thrombotic thrombocytopenic purpura) (HCC)     1982   Vitamin D deficiency 10/25/2018     Family History  Problem Relation Age of Onset   Pneumonia Mother    COPD Mother    Colon polyps Mother    Heart disease Mother    Thyroid disease Mother    Obesity Mother    COPD Father    Obesity Father    Brain cancer Maternal Grandfather    Alcohol abuse Maternal Grandfather    Hyperlipidemia Neg Hx    Hypertension Neg Hx    Colon cancer Neg Hx    Rectal cancer Neg Hx    Stomach cancer Neg Hx    Esophageal cancer Neg Hx     Social Hx: married resides with husband. Unemployed.     Review of Systems  Constitutional:  Negative for fever and malaise/fatigue.  HENT:  Negative for congestion.   Eyes:  Negative for blurred vision.  Respiratory:  Negative for cough and shortness of breath.   Cardiovascular:  Negative for chest pain, palpitations and leg swelling.  Gastrointestinal:  Negative for vomiting.  Musculoskeletal:  Positive for joint pain (Bilateral hips). Negative for back pain.  Skin:  Negative for rash.  Neurological:  Negative for loss of consciousness and headaches.        Objective:   Vitals: BP 104/80   Pulse (!) 111   Ht 5\' 7"  (1.702 m)   Wt 258 lb (117 kg)   SpO2 94%   BMI 40.41 kg/m    Physical Exam Vitals and nursing note reviewed.  Constitutional:      General: She is not in acute distress.    Appearance: Normal appearance. She is not toxic-appearing.  HENT:     Head: Normocephalic and atraumatic.  Cardiovascular:     Rate and Rhythm: Normal rate and regular rhythm. No extrasystoles are present.    Pulses:          Dorsalis pedis pulses are 1+ on the right side and 1+ on the left side.       Posterior tibial pulses are 0 on the right side and 0 on the left side.     Heart sounds: Normal heart sounds. No murmur heard.    No friction rub. No gallop.  Pulmonary:     Effort: Pulmonary effort is normal. No respiratory distress.     Breath sounds: Normal breath sounds. No wheezing or rales.   Skin:    General: Skin is warm and dry.     Comments: Surgical wound on back is healing well.  Neurological:     Mental Status: She is alert and oriented to person, place, and time. Mental status is at baseline.     Comments: Mild essential tremor.  Psychiatric:        Mood and Affect: Mood normal.        Behavior: Behavior normal.  Thought Content: Thought content normal.        Judgment: Judgment normal.       Results:   Studies obtained and personally reviewed by me:   Labs:       Component Value Date/Time   NA 137 05/24/2023 1351   NA 137 10/10/2018 1347   NA 136 04/20/2017 1422   K 4.2 05/24/2023 1351   K 4.0 04/20/2017 1422   CL 103 05/24/2023 1351   CL 108 (H) 12/05/2012 0953   CO2 23 05/24/2023 1351   CO2 28 04/20/2017 1422   GLUCOSE 86 05/24/2023 1351   GLUCOSE 136 04/20/2017 1422   GLUCOSE 112 (H) 12/05/2012 0953   BUN 10 05/24/2023 1351   BUN 13 10/10/2018 1347   BUN 19.8 04/20/2017 1422   CREATININE 0.99 05/24/2023 1351   CREATININE 0.90 02/05/2023 0925   CREATININE 1.0 04/20/2017 1422   CALCIUM 8.1 (L) 05/24/2023 1351   CALCIUM 9.0 04/20/2017 1422   PROT 7.5 2023-08-26 1000   PROT 7.5 10/10/2018 1347   PROT 7.4 04/20/2017 1422   ALBUMIN 3.8 05/24/2023 1351   ALBUMIN 4.4 10/10/2018 1347   ALBUMIN 3.7 04/20/2017 1422   AST 16 08/26/2023 1000   AST 32 04/20/2017 1422   ALT 10 Aug 26, 2023 1000   ALT 32 (H) 04/21/2017 1120   ALT 35 04/20/2017 1422   ALKPHOS 42 05/24/2023 1351   ALKPHOS 81 04/20/2017 1422   BILITOT 1.0 08/26/23 1000   BILITOT 0.5 10/10/2018 1347   BILITOT 0.78 04/20/2017 1422   GFRNONAA >60 05/24/2023 1351   GFRNONAA 59 (L) 01/20/2021 0929   GFRAA 68 01/20/2021 0929     Lab Results  Component Value Date   WBC 4.6 05/24/2023   HGB 13.1 05/24/2023   HCT 40.8 05/24/2023   MCV 95.6 05/24/2023   PLT 153 05/24/2023    Lab Results  Component Value Date   CHOL 205 (H) 2023/08/26   HDL 85 08-26-2023   LDLCALC 98  08-26-23   TRIG 120 26-Aug-2023   CHOLHDL 2.4 August 26, 2023    Lab Results  Component Value Date   HGBA1C 5.8 (H) 26-Aug-2023     Lab Results  Component Value Date   TSH 2.73 August 26, 2023      Assessment & Plan:   Bilateral hip pain: plans to schedule a consult with orthopedics.  GERD: stable with famotidine 20 mg twice daily, omeprazole 20 mg twice daily before meals.   Hyperlipidemia: treated with rosuvastatin 5 mg daily. Lipid panel normal. CHOL elevated at 205, HDL at 85. Liver functions normal.  Moderate persistent asthma and allergic rhinitis: treated with Ventolin inhaler, Advair inhaler, montelukast, dupilumab 300 mg every 14 days, benralizumab. Seen by allergist.  Hx of atopic dermatitis.    Hx of oral thrush and has been treated with nystatin orally.   Hx of dysrhythmia that was treated in the past with digoxin.  Return in 6 months for health maintenance exam or as needed.    I,Alexander Ruley,acting as a Neurosurgeon for [name], MD.,have documented all relevant documentation on the behalf of [name], MD,as directed by  [name], MD while in the presence of [name], MD.   I, [name], MD, have reviewed all documentation for this visit. The documentation on 08/28/23 for the exam, diagnosis, procedures, and orders are all accurate and complete.

## 2023-08-06 ENCOUNTER — Other Ambulatory Visit: Payer: Medicare Other

## 2023-08-06 DIAGNOSIS — E039 Hypothyroidism, unspecified: Secondary | ICD-10-CM

## 2023-08-06 DIAGNOSIS — E2839 Other primary ovarian failure: Secondary | ICD-10-CM

## 2023-08-06 DIAGNOSIS — E1169 Type 2 diabetes mellitus with other specified complication: Secondary | ICD-10-CM

## 2023-08-06 DIAGNOSIS — E119 Type 2 diabetes mellitus without complications: Secondary | ICD-10-CM

## 2023-08-06 DIAGNOSIS — Z780 Asymptomatic menopausal state: Secondary | ICD-10-CM

## 2023-08-07 LAB — HEPATIC FUNCTION PANEL
AG Ratio: 1.5 (calc) (ref 1.0–2.5)
ALT: 10 U/L (ref 6–29)
AST: 16 U/L (ref 10–35)
Albumin: 4.5 g/dL (ref 3.6–5.1)
Alkaline phosphatase (APISO): 49 U/L (ref 37–153)
Bilirubin, Direct: 0.2 mg/dL (ref 0.0–0.2)
Globulin: 3 g/dL (ref 1.9–3.7)
Indirect Bilirubin: 0.8 mg/dL (ref 0.2–1.2)
Total Bilirubin: 1 mg/dL (ref 0.2–1.2)
Total Protein: 7.5 g/dL (ref 6.1–8.1)

## 2023-08-07 LAB — LIPID PANEL
Cholesterol: 205 mg/dL — ABNORMAL HIGH (ref <200)
HDL: 85 mg/dL (ref >=50)
LDL Cholesterol (Calc): 98 mg/dL
Non-HDL Cholesterol (Calc): 120 mg/dL (ref <130)
Total CHOL/HDL Ratio: 2.4 (calc) (ref <5.0)
Triglycerides: 120 mg/dL (ref <150)

## 2023-08-07 LAB — TSH: TSH: 2.73 m[IU]/L (ref 0.40–4.50)

## 2023-08-07 LAB — HEMOGLOBIN A1C
Hgb A1c MFr Bld: 5.8 %{Hb} — ABNORMAL HIGH (ref <5.7)
Mean Plasma Glucose: 120 mg/dL
eAG (mmol/L): 6.6 mmol/L

## 2023-08-09 ENCOUNTER — Encounter: Payer: Self-pay | Admitting: Internal Medicine

## 2023-08-09 ENCOUNTER — Ambulatory Visit (INDEPENDENT_AMBULATORY_CARE_PROVIDER_SITE_OTHER): Payer: Medicare Other | Admitting: Internal Medicine

## 2023-08-09 VITALS — BP 104/80 | HR 111 | Ht 67.0 in | Wt 258.0 lb

## 2023-08-09 DIAGNOSIS — Z96643 Presence of artificial hip joint, bilateral: Secondary | ICD-10-CM

## 2023-08-09 DIAGNOSIS — F419 Anxiety disorder, unspecified: Secondary | ICD-10-CM

## 2023-08-09 DIAGNOSIS — Z853 Personal history of malignant neoplasm of breast: Secondary | ICD-10-CM

## 2023-08-09 DIAGNOSIS — M25552 Pain in left hip: Secondary | ICD-10-CM | POA: Diagnosis not present

## 2023-08-09 DIAGNOSIS — M25551 Pain in right hip: Secondary | ICD-10-CM

## 2023-08-09 DIAGNOSIS — E039 Hypothyroidism, unspecified: Secondary | ICD-10-CM

## 2023-08-09 DIAGNOSIS — K219 Gastro-esophageal reflux disease without esophagitis: Secondary | ICD-10-CM | POA: Diagnosis not present

## 2023-08-09 DIAGNOSIS — F32A Depression, unspecified: Secondary | ICD-10-CM

## 2023-08-09 DIAGNOSIS — F411 Generalized anxiety disorder: Secondary | ICD-10-CM

## 2023-08-09 DIAGNOSIS — J4550 Severe persistent asthma, uncomplicated: Secondary | ICD-10-CM

## 2023-08-09 DIAGNOSIS — E1169 Type 2 diabetes mellitus with other specified complication: Secondary | ICD-10-CM | POA: Diagnosis not present

## 2023-08-09 DIAGNOSIS — L209 Atopic dermatitis, unspecified: Secondary | ICD-10-CM

## 2023-08-09 DIAGNOSIS — Z87898 Personal history of other specified conditions: Secondary | ICD-10-CM

## 2023-08-09 DIAGNOSIS — E782 Mixed hyperlipidemia: Secondary | ICD-10-CM

## 2023-08-09 DIAGNOSIS — J4520 Mild intermittent asthma, uncomplicated: Secondary | ICD-10-CM

## 2023-08-23 ENCOUNTER — Ambulatory Visit (INDEPENDENT_AMBULATORY_CARE_PROVIDER_SITE_OTHER): Payer: Medicare Other

## 2023-08-23 DIAGNOSIS — J4550 Severe persistent asthma, uncomplicated: Secondary | ICD-10-CM | POA: Diagnosis not present

## 2023-08-28 NOTE — Patient Instructions (Addendum)
Multiple medical issues seem stable and under reasonable control. Continue statin medication. See orthopedist about bilateral hip pain. Return in 6 months. We are glad your thoracic pain has improved s/p surgery with Dr. Jordan Likes.

## 2023-08-30 ENCOUNTER — Other Ambulatory Visit: Payer: Self-pay

## 2023-08-30 MED ORDER — LEVOTHYROXINE SODIUM 88 MCG PO TABS: 88.0000 ug | ORAL_TABLET | Freq: Every day | ORAL | 1 refills | Status: DC

## 2023-08-30 MED ORDER — ROSUVASTATIN CALCIUM 5 MG PO TABS: 5.0000 mg | ORAL_TABLET | Freq: Every day | ORAL | 3 refills | Status: AC

## 2023-08-30 NOTE — Addendum Note (Signed)
Addended by: Gregery Na on: 08/30/2023 04:37 PM   Modules accepted: Orders

## 2023-09-01 DEATH — deceased

## 2023-09-06 NOTE — Telephone Encounter (Signed)
 Copied from CRM [id number]. Topic: Medical Record Request - Other >> Sep 06, 2023  2:01 PM Deleta HERO wrote: Reason for CRM: Assisted Living staff calling on behalf of patient for release of her medical records, they have spoken with [facility] Records, and they were informed that they are unable to disclose any patients information for this office?  Callback #: [phone]

## 2023-09-09 ENCOUNTER — Telehealth: Payer: Self-pay | Admitting: Internal Medicine

## 2023-09-09 NOTE — Telephone Encounter (Signed)
 Copied from CRM [id number]. Topic: Medical Record Request - Records Request >> Sep 09, 2023 11:09 AM Laurier BROCKS wrote: Reason for CRM: Tara [name] is calling to get medical record for the patient. She has already called Medical Records and was advised Dr.Baxley handles her own records. She would like a call back to see how she can get the records [phone].

## 2023-09-09 NOTE — Telephone Encounter (Signed)
 Went back and done research on this and I did receive request 07/12/2023 and sent to [facility], also sent 08/20/2023. I just sent them another request to see if this has been done yet.

## 2023-09-09 NOTE — Telephone Encounter (Signed)
 Called and gave them fax number to fax medical record request to.

## 2023-10-18 ENCOUNTER — Ambulatory Visit: Payer: Medicare Other

## 2023-10-19 ENCOUNTER — Ambulatory Visit: Payer: Medicare Other

## 2023-10-19 DIAGNOSIS — J4550 Severe persistent asthma, uncomplicated: Secondary | ICD-10-CM | POA: Diagnosis not present

## 2023-11-03 ENCOUNTER — Ambulatory Visit (INDEPENDENT_AMBULATORY_CARE_PROVIDER_SITE_OTHER): Payer: Medicare Other | Admitting: Internal Medicine

## 2023-11-03 ENCOUNTER — Encounter: Payer: Self-pay | Admitting: Internal Medicine

## 2023-11-03 VITALS — BP 130/78 | HR 108 | Temp 97.0°F | Resp 18

## 2023-11-03 DIAGNOSIS — L2084 Intrinsic (allergic) eczema: Secondary | ICD-10-CM

## 2023-11-03 DIAGNOSIS — J3089 Other allergic rhinitis: Secondary | ICD-10-CM

## 2023-11-03 DIAGNOSIS — K219 Gastro-esophageal reflux disease without esophagitis: Secondary | ICD-10-CM

## 2023-11-03 DIAGNOSIS — J4550 Severe persistent asthma, uncomplicated: Secondary | ICD-10-CM

## 2023-11-03 DIAGNOSIS — H1045 Other chronic allergic conjunctivitis: Secondary | ICD-10-CM

## 2023-11-03 DIAGNOSIS — J302 Other seasonal allergic rhinitis: Secondary | ICD-10-CM

## 2023-11-03 DIAGNOSIS — I872 Venous insufficiency (chronic) (peripheral): Secondary | ICD-10-CM

## 2023-11-03 MED ORDER — AZELASTINE HCL 0.1 % NA SOLN
1.0000 | Freq: Two times a day (BID) | NASAL | 5 refills | Status: DC | PRN
Start: 2023-11-03 — End: 2024-05-16

## 2023-11-03 MED ORDER — OMEPRAZOLE MAGNESIUM 20 MG PO TBEC: 20.0000 mg | DELAYED_RELEASE_TABLET | Freq: Every day | ORAL | 5 refills | Status: DC

## 2023-11-03 NOTE — Progress Notes (Signed)
 Follow Up Note  RE: VERSA CRATON MRN: [id number] DOB: 1956/06/03 Date of Office Visit: 11/03/2023  Referring provider: [name], MD Primary care provider: [name], MD  Chief Complaint: Nasal Congestion (On right side when she sleeps on right side)  History of Present Illness: I had the pleasure of seeing Tara [name] for a follow up visit at the [facility] on 11/03/2023. She is a 68 y.o. female, who is being followed for persistent asthma, atopic dermatitis, allergic rhinitis, reflux, oral candidiasis. Her previous allergy office visit was on 05/05/23 with Dr. Marlynn Perking. Today is a regular follow up visit.  History obtained from patient, chart review and    .  At last visit; FEV1: 2.00 L, 80% predicted, she was switched back to fasenra injections.   Tara [name] is a 68 year old female with asthma and eczema who presents for a six-month follow-up.  Asthma has been stable with no recent need for albuterol, antibiotics, or steroids, indicating well-controlled symptoms.  She has been compliant with Fasenra, Singulair, Advair 45 mcg 1 puff every other day.  Eczema has shown improvement, with less bothersome symptoms. She uses Cetaphil lotion and other products to manage skin dryness.  She continues to have diffuse xerosis.  She experiences nasal congestion, particularly when sleeping on her right side, leading to a stuffy right nostril upon waking. She uses Astelin nasal spray and has Flonase available, which she can tolerate.  Only using Astelin once a day and not using Flonase currently.  She also takes Singulair and Pataday eyedrops as needed.  She underwent back surgery involving separation of vertebrae and removal of a bone spur. Her pain has resolved completely, and she has not required any pain medication since December.  She has had worsening reflux and request to restart omeprazole.  She is on famotidine 20 mg twice daily and follows with  GI.  Overall except for the reflux she feels like her allergy symptoms are well-controlled.  Assessment and Plan: Dilpreet is a 68 y.o. female with: Severe persistent asthma without complication  Intrinsic atopic dermatitis  Gastroesophageal reflux disease without esophagitis  Seasonal and perennial allergic rhinitis  Other chronic allergic conjunctivitis of both eyes  Stasis dermatitis of both legs Plan: Patient Instructions  Moderate persistent asthma:  well controlled  Will update breathing tests at next visit.    PLAN:  -  Biologic:  We will switch to fasenra to lesson the visits for injections  - Daily controller medication(s): Singulair 10mg  daily and Advair 1 puff every other day as needed  - Prior to physical activity: albuterol 2 puffs 10-15 minutes before physical activity. - Rescue medications: albuterol 4 puffs every 4-6 hours as needed - Get Influenza Vaccine and appropriate Pneumonia and COVID 19 boosters  - Asthma control goals:  * Full participation in all desired activities (may need albuterol before activity) * Albuterol use two time or less a week on average (not counting use with activity) * Cough interfering with sleep two time or less a month * Oral steroids no more than once a year * No hospitalizations   Allergic Rhinitis: controlled  - Continue with:  Benadryl 25mg  at night, Singulair (montelukast) 10mg  daily, and Astelin (azelastine) 2 sprays per nostril 1-2 times daily  (can take astelin and flonase at the same time, Flonase 1  sprays per nostril twice daily  -Sinus rinses twice daily, used 10 to 50 minutes before nasal sprays -  Pataday eye drops 1 drop per eye twice a day as needed    Atopic Dermatitis with stasis dermatitis   Daily Care For Maintenance (daily and continue even once eczema controlled) - Daily moisturizer recommended for stasis dermatitis  - Recommend avoiding detergents, soaps or lotions with fragrances/dyes, and instead using  products which are hypoallergenic, use second rinse cycle when washing clothes -Wear lose breathable clothing, avoid wool -Avoid extremes of humidity - Limit showers/baths to 5 minutes and use luke warm water instead of hot, pat dry following baths, and apply moisturizer  For Flares:(add this to maintenance therapy if needed for flares) - Clobetasol 0.0.5% to body for severe flares-apply topically twice daily to red, raised, thickened areas of skin, followed by moisturizer   -Use for 5 days then step down to triamcinolone 0.1% ointment  - Triamcinolone 0.1% to body for moderate flares-apply topically twice daily to red, raised areas of skin, followed by moisturizer - Hydrocortisone 2.5% to face, armpit or groin-apply topically twice daily to red, raised areas of skin, followed by moisturizer    Reflux - Continue lifestyle and dietary modifications - Continue famotidine twice a day - Continue to follow with gastroenterology   Follow up: 6 months   Thank you so much for letting me partake in your care today.  Don't hesitate to reach out if you have any additional concerns!  Ferol Luz, MD  [facility] [place] [place]  No follow-ups on file.  Meds ordered this encounter  Medications   azelastine (ASTELIN) 0.1 % nasal spray    Sig: Place 1-2 sprays into both nostrils 2 (two) times daily as needed.    Dispense:  30 mL    Refill:  5   omeprazole (PRILOSEC OTC) 20 MG tablet    Sig: Take 1 tablet (20 mg total) by mouth daily.    Dispense:  30 tablet    Refill:  5    Lab Orders  No laboratory test(s) ordered today   Diagnostics: None done    Results interpreted by myself during this encounter and discussed with patient/family.   Medication List:  Current Outpatient Medications  Medication Sig Dispense Refill   acetaminophen (TYLENOL) 500 MG tablet Take 500 mg by mouth every 6 (six) hours as needed for mild pain, moderate pain or headache.      ADVAIR HFA  45-21 MCG/ACT inhaler Inhale 2 puffs into the lungs 2 (two) times daily. 1 each 5   albuterol (VENTOLIN HFA) 108 (90 Base) MCG/ACT inhaler Two puffs every 4 hours if needed for wheezing or coughing .  May use 2 puffs 5-15 minutes prior to exercise. 18 g 1   benzonatate (TESSALON) 100 MG capsule Take 1 capsule (100 mg total) by mouth 3 (three) times daily as needed. 30 capsule 0   clobetasol ointment (TEMOVATE) 0.05 % Apply 1 Application topically 2 (two) times daily. 30 g 0   cyclobenzaprine (FLEXERIL) 10 MG tablet Take 1 tablet (10 mg total) by mouth 3 (three) times daily as needed for muscle spasms. 30 tablet 0   diphenhydramine-acetaminophen (TYLENOL PM) 25-500 MG TABS tablet Take 1 tablet by mouth at bedtime as needed (pain/sleep.).     famotidine (PEPCID) 20 MG tablet TAKE 1 TABLET BY MOUTH 2 TIMES A DAY **MUST MAKE FOLLOW UP APPOINTMENT FOR FURTHER REFILLS** 180 tablet 1   gabapentin (NEURONTIN) 100 MG capsule Take 100 mg by mouth 3 (three) times daily as needed (pain.).     Glucosamine-Chondroitin (MOVE  FREE PO) Take 1 tablet by mouth in the morning.     levothyroxine (SYNTHROID) 88 MCG tablet Take 1 tablet (88 mcg total) by mouth daily. 90 tablet 1   magnesium oxide (MAG-OX) 400 (240 Mg) MG tablet Take 400 mg by mouth in the morning.     metFORMIN (GLUCOPHAGE) 500 MG tablet Take 1 tablet (500 mg total) by mouth 2 (two) times daily with a meal. 180 tablet 2   montelukast (SINGULAIR) 10 MG tablet TAKE 1 TABLET BY MOUTH AT BEDTIME 90 tablet 1   nystatin (MYCOSTATIN) 100000 UNIT/ML suspension Use as directed 5 mLs in the mouth or throat 4 (four) times daily as needed (thrush/irritation (due to inhaler use)).     oxyCODONE (OXY IR/ROXICODONE) 5 MG immediate release tablet Take 1 tablet (5 mg total) by mouth every 6 (six) hours as needed (pain.). 40 tablet 0   Polyethyl Glycol-Propyl Glycol (SYSTANE) 0.4-0.3 % SOLN Place 1-2 drops into both eyes 3 (three) times daily as needed (dry/irritated eyes.).      Probiotic Product (PROBIOTIC PO) Take 1 capsule by mouth in the morning.     rosuvastatin (CRESTOR) 5 MG tablet Take 1 tablet (5 mg total) by mouth daily. 90 tablet 3   Spacer/Aero-Holding Chambers (AEROCHAMBER MV) inhaler Use as instructed 1 each 2   triamcinolone ointment (KENALOG) 0.1 % Apply 1 Application topically 2 (two) times daily. 30 g 0   azelastine (ASTELIN) 0.1 % nasal spray Place 1-2 sprays into both nostrils 2 (two) times daily as needed. 30 mL 5   metoprolol tartrate (LOPRESSOR) 25 MG tablet Take 0.5 tablets (12.5 mg total) by mouth 2 (two) times daily. (Patient taking differently: Take 12.5 mg by mouth 2 (two) times daily as needed (fast heart rate).) 90 tablet 3   omeprazole (PRILOSEC OTC) 20 MG tablet Take 1 tablet (20 mg total) by mouth daily. 30 tablet 5   Current Facility-Administered Medications  Medication Dose Route Frequency Provider Last Rate Last Admin   benralizumab (FASENRA) prefilled syringe 30 mg  30 mg Subcutaneous Q28 days Ferol Luz, MD   30 mg at 10/19/23 1105   Allergies: Allergies  Allergen Reactions   Aspirin Other (See Comments)    Pt has a hx of TTP.    Nsaids Other (See Comments)    Pt has a hx of TTP.    I reviewed her past medical history, social history, family history, and environmental history and no significant changes have been reported from her previous visit.  ROS: All others negative except as noted per HPI.   Objective: BP 130/78   Pulse (!) 108   Temp (!) 97 F (36.1 C) (Temporal)   Resp 18   SpO2 97%  There is no height or weight on file to calculate BMI. General Appearance:  Alert, cooperative, tearful , appears stated age  Head:  Normocephalic, without obvious abnormality, atraumatic  Eyes:  Conjunctiva clear, EOM's intact  Nose: Nares normal, normal mucosa, no visible anterior polyps, and septum midline  Throat: Lips, tongue normal; teeth and gums normal, normal posterior oropharynx  Neck: Supple, symmetrical   Lungs:   clear to auscultation bilaterally, Respirations unlabored, no coughing  Heart:  regular rate and rhythm and no murmur, Appears well perfused  Extremities: No edema  Skin: Skin color, texture, turgor normal, xerosis of bilateral lower legs   Neurologic: No gross deficits   Previous notes and tests were reviewed. The plan was reviewed with the patient/family, and all questions/concerned were  addressed.  It was my pleasure to see Jenilee today and participate in her care. Please feel free to contact me with any questions or concerns.  Sincerely,  Ferol Luz, MD  Allergy & Immunology  [facility] Office: [phone]

## 2023-11-03 NOTE — Patient Instructions (Addendum)
 Moderate persistent asthma:  well controlled  Will update breathing tests at next visit.    PLAN:  -  Biologic:  We will switch to fasenra to lesson the visits for injections  - Daily controller medication(s): Singulair 10mg  daily and Advair 1 puff every other day as needed  - Prior to physical activity: albuterol 2 puffs 10-15 minutes before physical activity. - Rescue medications: albuterol 4 puffs every 4-6 hours as needed - Get Influenza Vaccine and appropriate Pneumonia and COVID 19 boosters  - Asthma control goals:  * Full participation in all desired activities (may need albuterol before activity) * Albuterol use two time or less a week on average (not counting use with activity) * Cough interfering with sleep two time or less a month * Oral steroids no more than once a year * No hospitalizations   Allergic Rhinitis: controlled  - Continue with:  Benadryl 25mg  at night, Singulair (montelukast) 10mg  daily, and Astelin (azelastine) 2 sprays per nostril 1-2 times daily  (can take astelin and flonase at the same time, Flonase 1  sprays per nostril twice daily  -Sinus rinses twice daily, used 10 to 50 minutes before nasal sprays - Pataday eye drops 1 drop per eye twice a day as needed    Atopic Dermatitis with stasis dermatitis   Daily Care For Maintenance (daily and continue even once eczema controlled) - Daily moisturizer recommended for stasis dermatitis  - Recommend avoiding detergents, soaps or lotions with fragrances/dyes, and instead using products which are hypoallergenic, use second rinse cycle when washing clothes -Wear lose breathable clothing, avoid wool -Avoid extremes of humidity - Limit showers/baths to 5 minutes and use luke warm water instead of hot, pat dry following baths, and apply moisturizer  For Flares:(add this to maintenance therapy if needed for flares) - Clobetasol 0.0.5% to body for severe flares-apply topically twice daily to red, raised, thickened  areas of skin, followed by moisturizer   -Use for 5 days then step down to triamcinolone 0.1% ointment  - Triamcinolone 0.1% to body for moderate flares-apply topically twice daily to red, raised areas of skin, followed by moisturizer - Hydrocortisone 2.5% to face, armpit or groin-apply topically twice daily to red, raised areas of skin, followed by moisturizer    Reflux - Continue lifestyle and dietary modifications - Continue famotidine twice a day - Continue to follow with gastroenterology   Follow up: 6 months   Thank you so much for letting me partake in your care today.  Don't hesitate to reach out if you have any additional concerns!  Ferol Luz, MD  [facility] [place] [place]

## 2023-11-29 ENCOUNTER — Other Ambulatory Visit: Payer: Self-pay | Admitting: Internal Medicine

## 2023-12-14 ENCOUNTER — Ambulatory Visit: Payer: Medicare Other

## 2023-12-14 DIAGNOSIS — J4550 Severe persistent asthma, uncomplicated: Secondary | ICD-10-CM | POA: Diagnosis not present

## 2024-01-20 ENCOUNTER — Telehealth: Payer: Self-pay

## 2024-01-20 NOTE — Telephone Encounter (Signed)
 Copied from CRM [id number]. Topic: Appointments - Appointment Info/Confirmation >> Jan 20, 2024 12:18 PM Star [place] wrote: Patient is calling back about the appt change, she is fine with 6/12 11:30.

## 2024-01-25 ENCOUNTER — Other Ambulatory Visit: Payer: Self-pay | Admitting: Internal Medicine

## 2024-01-31 ENCOUNTER — Other Ambulatory Visit: Payer: Medicare Other

## 2024-01-31 DIAGNOSIS — F32A Depression, unspecified: Secondary | ICD-10-CM

## 2024-01-31 DIAGNOSIS — E1169 Type 2 diabetes mellitus with other specified complication: Secondary | ICD-10-CM

## 2024-01-31 DIAGNOSIS — K219 Gastro-esophageal reflux disease without esophagitis: Secondary | ICD-10-CM

## 2024-01-31 DIAGNOSIS — Z0000 Encounter for general adult medical examination without abnormal findings: Secondary | ICD-10-CM

## 2024-01-31 DIAGNOSIS — E039 Hypothyroidism, unspecified: Secondary | ICD-10-CM

## 2024-01-31 LAB — HM MAMMOGRAPHY

## 2024-02-01 LAB — COMPLETE METABOLIC PANEL WITHOUT GFR
AG Ratio: 1.7 (calc) (ref 1.0–2.5)
ALT: 10 U/L (ref 6–29)
AST: 14 U/L (ref 10–35)
Albumin: 4.5 g/dL (ref 3.6–5.1)
Alkaline phosphatase (APISO): 46 U/L (ref 37–153)
BUN: 12 mg/dL (ref 7–25)
CO2: 24 mmol/L (ref 20–32)
Calcium: 8.7 mg/dL (ref 8.6–10.4)
Chloride: 102 mmol/L (ref 98–110)
Creat: 1.03 mg/dL (ref 0.50–1.05)
Globulin: 2.7 g/dL (ref 1.9–3.7)
Glucose, Bld: 97 mg/dL (ref 65–99)
Potassium: 4.6 mmol/L (ref 3.5–5.3)
Sodium: 140 mmol/L (ref 135–146)
Total Bilirubin: 1.2 mg/dL (ref 0.2–1.2)
Total Protein: 7.2 g/dL (ref 6.1–8.1)

## 2024-02-01 LAB — LIPID PANEL
Cholesterol: 204 mg/dL — ABNORMAL HIGH (ref <200)
HDL: 86 mg/dL (ref >=50)
LDL Cholesterol (Calc): 97 mg/dL
Non-HDL Cholesterol (Calc): 118 mg/dL (ref <130)
Total CHOL/HDL Ratio: 2.4 (calc) (ref <5.0)
Triglycerides: 118 mg/dL (ref <150)

## 2024-02-01 LAB — TSH: TSH: 3.26 m[IU]/L (ref 0.40–4.50)

## 2024-02-01 LAB — HEMOGLOBIN A1C
Hgb A1c MFr Bld: 5.6 % (ref <5.7)
Mean Plasma Glucose: 114 mg/dL
eAG (mmol/L): 6.3 mmol/L

## 2024-02-01 LAB — CBC WITH DIFFERENTIAL/PLATELET
Absolute Lymphocytes: 475 {cells}/uL — ABNORMAL LOW (ref 850–3900)
Absolute Monocytes: 517 {cells}/uL (ref 200–950)
Basophils Absolute: 50 {cells}/uL (ref 0–200)
Basophils Relative: 1.2 %
Eosinophils Absolute: 0 {cells}/uL — ABNORMAL LOW (ref 15–500)
Eosinophils Relative: 0 %
HCT: 39.7 % (ref 35.0–45.0)
Hemoglobin: 13 g/dL (ref 11.7–15.5)
MCH: 30 pg (ref 27.0–33.0)
MCHC: 32.7 g/dL (ref 32.0–36.0)
MCV: 91.7 fL (ref 80.0–100.0)
MPV: 13.5 fL — ABNORMAL HIGH (ref 7.5–12.5)
Monocytes Relative: 12.3 %
Neutro Abs: 3158 {cells}/uL (ref 1500–7800)
Neutrophils Relative %: 75.2 %
Platelets: 155 10*3/uL (ref 140–400)
RBC: 4.33 10*6/uL (ref 3.80–5.10)
RDW: 13.6 % (ref 11.0–15.0)
Total Lymphocyte: 11.3 %
WBC: 4.2 10*3/uL (ref 3.8–10.8)

## 2024-02-01 LAB — MICROALBUMIN / CREATININE URINE RATIO
Creatinine, Urine: 61 mg/dL (ref 20–275)
Microalb Creat Ratio: 10 mg/g{creat} (ref <30)
Microalb, Ur: 0.6 mg/dL

## 2024-02-03 ENCOUNTER — Encounter: Payer: Self-pay | Admitting: Internal Medicine

## 2024-02-07 ENCOUNTER — Ambulatory Visit: Payer: Medicare Other | Admitting: Internal Medicine

## 2024-02-08 ENCOUNTER — Ambulatory Visit

## 2024-02-08 DIAGNOSIS — J4550 Severe persistent asthma, uncomplicated: Secondary | ICD-10-CM

## 2024-02-08 NOTE — Progress Notes (Signed)
 Patient Care Team: Sylvan Evener, MD as PCP - General (Internal Medicine) Aminta Kales, MD as Consulting Physician (Ophthalmology)  Visit Date: 02/10/24   Chief Complaint  Patient presents with   Medical Management of Chronic Issues   Subjective:  Patient: Tara [name], Female DOB: 04-26-1956, 68 y.o. MRN: [id number] Tara [name] is a 68 y.o. Female who was referred to this office by Dr. Charolett Copes in 2013. She presents today for her 6 month follow-up. Patient has Hypothyroidism; Rosacea, Acne; Dysrhythmia, Cardiac; Lymphedema Of Arm; Hx Of Radiation Therapy; Other Allergic Rhinitis; Status Post Bilateral Hip Replacements; History Of TTP (Thrombotic Thrombocytopenic Purpura); Chronic Hepatitis C Without Hepatic Coma; Hypokalemia; Malignant Neoplasm Of Upper-Outer Quadrant Of Left Breast In Female, Estrogen Receptor Positive; Malignant Neoplasm Of Axillary Tail Of Left Breast In Female, Estrogen Receptor Positive; Cirrhosis; Gastroesophageal Reflux Disease; Controlled Type 2 Diabetes Mellitus Without Complication, Without Long-Term Current Use Of Insulin ; History Of Left Hip Replacement; History Of Total Right Hip Replacement; Prediabetes; Class 2 Severe Obesity With Serious Comorbidity And Body Mass Index (BMI) Of 37.0 To 37.9 In Adult; Vitamin D  Deficiency; Arthritis; Moderate Persistent Asthma Without Complication; History Of Cardiac Arrhythmia; History Of Breast Cancer; History Of Osteoporosis; Intrinsic Atopic Dermatitis; History Of Hepatitis C; Cough; Severe Persistent Asthma Without Complication; Oral Candidiasis; Dry Eye Syndrome Of Both Eyes; Allergy; Anxiety; Back Pain; Chronic Edema; Cold Hands And Feet; Complication Of Anesthesia; Environmental Allergies; Eye Pain; Fatigue; H/O Varicella; H/O Varicose Veins; Hepatic Hepatitis C; History Of Chemotherapy; History Of Measles, Mumps, Or Rubella; Hyperlipidemia; Irregular Heart Beat; Joint Pain; Leg Cramps; Neuromuscular Disorder;  Osteopenia; Palpitations; Radiculopathy; Rheumatoid Arthritis; Ringing In Ear; Swallowing Difficulty; Thrombocytopenia, Primary; TTP (Thrombotic Thrombocytopenic Purpura); Chronic Pain Syndrome; PSVT (Paroxysmal Supraventricular Tachycardia) Asymptomatic Short Runs On Event Monitor September 2024; Mitral Regurgitation; Preoperative Cardiovascular Examination; and Thoracic Radiculopathy.  Mentions having an itchy rash.   History of Tremor, Hands mentioned last June and again in December has worsened according to her. She would like this evaluated by Neurology.This appears to be an essential tremor.  History of Dysrhythmia treated with Metoprolol  tartrate 12.5 mg twice daily as needed for tachycardia. Pulse elevated at 108, Blood Pressure: normotensive today at 128/80. Has history of Chronic Edema.  History of Hyperlipidemia treated with  Rosuvastatin  5 mg daily. 01/31/2024 Lipid Panel, compared to 01/2023: Cholesterol 204, elevated from 186; otherwise WNL.  History of Impaired Glucose Tolerance treated with Metformin  500 mg twice daily.  01/31/2024 HgbA1c 5.6% Glucose 97. Due for eye exam.   History of Hypothyroidism treated with Levothyroxine  88 mcg daily. 01/31/2024 TSH: 3.26. Will not change dose at this time.  History of Asthma; Allergic Rhinitis treated with Advair  inhaler twice daily, Albuterol  inhaler as needed, Asteline nasal spray as needed, Singulair  10 mg at bedtime, and receives Benralizumab  30 mg injected subcutaneously every 28 days. Followed by Dr. Jolayne Natter, Allergist. Has hx of Oral Thrust, treated with Nystatin  orally, and hx of Atopic Dermatitis.  History of GERD treated with Pepcid  20 mg twice daily and Omeprazole  20 mg daily.  History of Left Thoracic Radiculopathy; Back Pain previously followed by Dr. Eben Golds where she received epidural injections. S/p Left Thoracic Discectomy//Laminectomy & Foraminotomy at T11-12 in 06/2023; Musculoskeletal Pain managed with Tylenol  500 mg every 6  hours as needed or Tylenol  PM 25-500 mg as bedtime as needed, Oxycodone  5 mg every 6 hours as needed, Flexeril  10 mg three times daily as needed, and Gabapentin  100 mg three times daily as needed.  Reports significant improvement.  History of Thrombotic Thrombocytopenia Purpura treated with blood transfusions initial occurrence in 899 at 68 years old, recurrence in 4469 at 68 years old and has not had a splenectomy.   History of Hepatitis C, which patient believes she contracted via blood transfusion while living in [place], treated with Harvoni .  LFTs are normal. Followed by Dr. Savannah Curlin for advanced fibrosis and possible early cirrhosis. Steatosis also present. Gets abdominal imaging every 6 months for hepatocellular carcinoma surveillance.  Hx of gastroparesis with gastric emptying study showing delayed emptying in 2022.  History of Obesity; BMI 35+, today weight is 269 lbs BMI 42.13, since June 2024 has gained 16 pounds. She is s/p back surgery since October 21st when she was 255 lbs BMI 41.16, so she hasn't been as active outside of her PT, which she recently completed. Has gained 13 pounds since 2023.Has never been very motivated to diet and exercise and some medical issues have precluded efforts to exercise.  Labs 01/31/2024 CBC, compared to 01/2023: MPV 13.5, elevated from 11.8; Absolute Eosinophils 0, decreased from 181; otherwise WNL.  CMP: WNL   Mammogram 01/31/2024  normal with repeat recommendation of 2026.  Overdue for Colonoscopy since 2024; last completed 08/30/2020 removed one 4 mm residual polyp from hepatic flexure (found to be 1 fragment of tubular adenoma, 7 fragments benign colonic mucosa, w/o high-grade dysplasia or malignancy per pathology) and one 3 mm possible polyp that was carpet-like and hard to distinguish from polypoid colonic mucosa (found to be an inflammatory polyp w/o high grade dysplasia or malignancy); a few Small & Large-mouthed Diverticula in sigmoid and  descending colon.  Bone Density 01/19/2022  normal.   Vaccine Counseling: UTD on Flu, Shingles 2/2, PNA, and Tdap.  Past Medical History:  Diagnosis Date   Allergy    Anxiety    Arthritis    Back pain    Chronic edema    Chronic hepatitis C without hepatic coma (HCC) 10/29/2012   Chronic pain syndrome 07/01/2018   Cirrhosis (HCC) 05/10/2017   Class 2 severe obesity with serious comorbidity and body mass index (BMI) of 37.0 to 37.9 in adult (HCC) 10/25/2018   Cold hands and feet    Complication of anesthesia    difficulty waking up/dizzy/lightheaded   Controlled type 2 diabetes mellitus without complication, without long-term current use of insulin  (HCC) 07/11/2018   Cough    Dry eye syndrome of both eyes 09/02/2022   Dysrhythmia, cardiac 09/28/2011   She has no records with her today. She tells me that a few years ago in [place] she had palpitations and a cardiology placed her on digoxin , she does not know what type of dysrhythmia. She has not had any palpitations recently     Environmental allergies    Eye pain    Fatigue    Gastroesophageal reflux disease 11/05/2015   H/O varicella    H/O varicose veins    Hepatic cirrhosis (HCC)    Hepatitis C    History of breast cancer 01/15/2020   History of cardiac arrhythmia 01/15/2020   History of chemotherapy 2013   left breast cancer   History of hepatitis C 01/15/2020   History of left hip replacement 08/29/2018   History of measles, mumps, or rubella    History of osteoporosis 01/15/2020   History of total right hip replacement 08/29/2018   History of TTP (thrombotic thrombocytopenic purpura) 10/29/2012   Hx of radiation therapy 12/15/11 - 01/29/12   left breast  Hyperlipidemia    Hypokalemia 12/29/2013   Hypothyroidism    Intrinsic atopic dermatitis 01/15/2020   Irregular heart beat    under control   Joint pain    Leg cramps    Lymphedema of arm 12/15/2011   Malignant neoplasm of axillary tail of left breast in female,  estrogen receptor positive (HCC) 08/12/2015   Malignant neoplasm of upper-outer quadrant of left breast in female, estrogen receptor positive (HCC) 07/17/2014   Moderate persistent asthma without complication 01/15/2020   Neuromuscular disorder (HCC)    left foot nerve damage- neuropathy    Oral candidiasis 09/02/2022   Osteopenia    Other allergic rhinitis 10/29/2012   Palpitations    Prediabetes 10/25/2018   Radiculopathy 07/2020   Rheumatoid arthritis (HCC)    Ringing in ear    Rosacea, acne 09/28/2011   Severe persistent asthma without complication 09/02/2022   Status post bilateral hip replacements 10/29/2012   Swallowing difficulty    Swelling of both lower extremities    Thrombocytopenia, primary (HCC)    TTP (thrombotic thrombocytopenic purpura) (HCC)    1982   Vitamin D  deficiency 10/25/2018   Medical/Surgical History Narrative:   Allergic/Intolerant to: Due to Hx of TTP can not tolerate Aspirin or NSAIDs   2022 - Displaced Fracture, Proximal Phalanx, Right Great Toe in April after falling at an [employer] concert. Was seen by Podiatrist.   Past Surgical History:  Procedure Laterality Date   BREAST LUMPECTOMY  08/03/11   left lumpectomy and slnbx,T1cN0,triple pos   CATARACT EXTRACTION, BILATERAL     COLONOSCOPY  01/26/2019   COLONOSCOPY  08/12/2020   elbow pins Left    FOOT SURGERY Left    multiple   POLYPECTOMY     PORTACATH PLACEMENT  08/03/2011   Procedure: INSERTION PORT-A-CATH;  Surgeon: Mayme Spearman, MD;  Location: [facility] OR;  Service: General;  Laterality: Right;   portacath removal     THORACIC DISCECTOMY Left 06/21/2023   Procedure: Laminectomy and Foraminotomy - left - Thoracic eleven-Thoracic twelve;  Surgeon: Agustina Aldrich, MD;  Location: [facility] OR;  Service: Neurosurgery;  Laterality: Left;   TOTAL HIP ARTHROPLASTY Bilateral    UPPER GASTROINTESTINAL ENDOSCOPY     UPPER GASTROINTESTINAL ENDOSCOPY  08/12/2020  Other - Hx of: Gastroparesis w/ delayed gastric  emptying Family History  Problem Relation Age of Onset   Pneumonia Mother    COPD Mother    Colon polyps Mother    Heart disease Mother    Thyroid  disease Mother    Obesity Mother    COPD Father    Obesity Father    Brain cancer Maternal Grandfather    Alcohol  abuse Maternal Grandfather    Hyperlipidemia Neg Hx    Hypertension Neg Hx    Colon cancer Neg Hx    Rectal cancer Neg Hx    Stomach cancer Neg Hx    Esophageal cancer Neg Hx    Social Hx: Married.   Review of Systems  Constitutional:  Negative for chills, fever, malaise/fatigue and weight loss.  HENT:  Negative for hearing loss, sinus pain and sore throat.   Respiratory:  Negative for cough, hemoptysis and shortness of breath.   Cardiovascular:  Negative for chest pain, palpitations, leg swelling and PND.  Gastrointestinal:  Negative for abdominal pain, constipation, diarrhea, heartburn, nausea and vomiting.  Genitourinary:  Negative for dysuria, frequency and urgency.  Musculoskeletal:  Negative for back pain, myalgias and neck pain.  Skin:  Negative for itching and  rash.  Neurological:  Positive for tremors. Negative for dizziness, tingling, seizures and headaches.  Endo/Heme/Allergies:  Negative for polydipsia.  Psychiatric/Behavioral:  Negative for depression. The patient is not nervous/anxious.   All other systems reviewed and are negative.   Objective:  Vitals: BP 128/80   Pulse (!) 108   Ht 5' 7 (1.702 m)   Wt 269 lb (122 kg)   SpO2 95%   BMI 42.13 kg/m  Physical Exam Vitals and nursing note reviewed.  Constitutional:      General: She is not in acute distress.    Appearance: Normal appearance. She is not toxic-appearing.  HENT:     Head: Normocephalic and atraumatic.   Cardiovascular:     Rate and Rhythm: Normal rate and regular rhythm. No extrasystoles are present.    Pulses: Normal pulses.     Heart sounds: Normal heart sounds. No murmur heard.    No friction rub. No gallop.  Pulmonary:      Effort: Pulmonary effort is normal. No respiratory distress.     Breath sounds: Normal breath sounds. No wheezing or rales.   Musculoskeletal:     Right lower leg: Edema present.     Left lower leg: Edema present.   Skin:    General: Skin is warm and dry.   Neurological:     Mental Status: She is alert and oriented to person, place, and time. Mental status is at baseline.     Motor: Tremor (in hands, while at rest) present.   Psychiatric:        Mood and Affect: Mood normal.        Behavior: Behavior normal.        Thought Content: Thought content normal.        Judgment: Judgment normal.    Results:  Studies Obtained And Personally Reviewed By Me:   Labs:     Component Value Date/Time   NA 140 01/31/2024 0917   NA 137 10/10/2018 1347   NA 136 04/20/2017 1422   K 4.6 01/31/2024 0917   K 4.0 04/20/2017 1422   CL 102 01/31/2024 0917   CL 108 (H) 12/05/2012 0953   CO2 24 01/31/2024 0917   CO2 28 04/20/2017 1422   GLUCOSE 97 01/31/2024 0917   GLUCOSE 136 04/20/2017 1422   GLUCOSE 112 (H) 12/05/2012 0953   BUN 12 01/31/2024 0917   BUN 13 10/10/2018 1347   BUN 19.8 04/20/2017 1422   CREATININE 1.03 01/31/2024 0917   CREATININE 1.0 04/20/2017 1422   CALCIUM  8.7 01/31/2024 0917   CALCIUM  9.0 04/20/2017 1422   PROT 7.2 01/31/2024 0917   PROT 7.5 10/10/2018 1347   PROT 7.4 04/20/2017 1422   ALBUMIN 3.8 05/24/2023 1351   ALBUMIN 4.4 10/10/2018 1347   ALBUMIN 3.7 04/20/2017 1422   AST 14 01/31/2024 0917   AST 32 04/20/2017 1422   ALT 10 01/31/2024 0917   ALT 32 (H) 04/21/2017 1120   ALT 35 04/20/2017 1422   ALKPHOS 42 05/24/2023 1351   ALKPHOS 81 04/20/2017 1422   BILITOT 1.2 01/31/2024 0917   BILITOT 0.5 10/10/2018 1347   BILITOT 0.78 04/20/2017 1422   GFRNONAA >60 05/24/2023 1351   GFRNONAA 59 (L) 01/20/2021 0929   GFRAA 68 01/20/2021 0929    Lab Results  Component Value Date   WBC 4.2 01/31/2024   HGB 13.0 01/31/2024   HCT 39.7 01/31/2024   MCV 91.7  01/31/2024   PLT 155 01/31/2024   Lab Results  Component Value Date   CHOL 204 (H) 01/31/2024   HDL 86 01/31/2024   LDLCALC 97 01/31/2024   TRIG 118 01/31/2024   CHOLHDL 2.4 01/31/2024   Lab Results  Component Value Date   HGBA1C 5.6 01/31/2024    Lab Results  Component Value Date   TSH 3.26 01/31/2024    Assessment & Plan:   Orders Placed This Encounter  Procedures   Ambulatory referral to Neurology    Referral Priority:   Routine    Referral Type:   Consultation    Referral Reason:   Specialty Services Required    Referred to Provider:   Debbra Fairy, MD    Requested Specialty:   Neurology    Number of Visits Requested:   1  Other Labs Reviewed today: CBC, compared to 01/2023: MPV 13.5, elevated from 11.8; Absolute Eosinophils 0, decreased from 181; otherwise WNL.  CMP: WNL   Atopic Dermatitis - has an itchy rash,   Tremor, Hands mentioned last June and again in December has worsened according to her. Will refer to Neurology.   Dysrhythmia treated with Metoprolol  tartrate 12.5 mg twice daily as needed for tachycardia. Pulse elevated at 108, Blood Pressure: normotensive today at 128/80. Has history of Chronic Edema.  Hyperlipidemia treated with  Rosuvastatin  5 mg daily. 01/31/2024 Lipid Panel, compared to 01/2023: Cholesterol 204, elevated from 186; otherwise WNL.  Impaired Glucose Tolerance treated with Metformin  500 mg twice daily.  01/31/2024 HgbA1c 5.6, Glucose 97. Due for eye exam.   Hypothyroidism treated with Levothyroxine  88 mcg daily. 01/31/2024 TSH: 3.26.   Asthma; Allergic Rhinitis treated with Advair  inhaler twice daily, Albuterol  inhaler as needed, Asteline nasal spray as needed, Singulair  10 mg at bedtime, and receives Benralizumab  30 mg injected subcutaneously every 28 days. Followed by Dr. Jolayne Natter, Allergist. Has hx of Oral Thrust, treated with Nystatin  orally, and hx of Atopic Dermatitis.  GERD treated with Pepcid  20 mg twice daily and Omeprazole  20 mg  daily.  Left Thoracic Radiculopathy; Back Pain previously followed by Dr. Eben Golds where she received epidural injections. S/p Left Thoracic Discectomy//Laminectomy & Foraminotomy at T11-12 in 06/2023; Musculoskeletal Pain managed with Tylenol  500 mg every 6 hours as needed or Tylenol  PM 25-500 mg as bedtime as needed, Oxycodone  5 mg every 6 hours as needed, Flexeril  10 mg three times daily as needed, and Gabapentin  100 mg three times daily as needed.   Thrombotic Thrombocytopenia Purpura treated with blood transfusions initial occurrence in 6670 at 68 years old, recurrence in 2321 at 68 years old and has not underwent a splenectomy.   History of Hepatitis C treated with Harvoni .   Obesity; BMI 35+, today weight is 269 lbs BMI 42.13, since June 2024 has gained 16 pounds. She is s/p back surgery since October 21st when she was 255 lbs BMI 41.16, so she hasn't been as active outside of her PT, which she recently completed.   Mammogram 01/31/2024  normal with repeat recommendation of 2026.  Overdue for Colonoscopy since 2024; last completed 08/30/2020 removed one 4 mm residual polyp from hepatic flexure (found to be 1 fragment of tubular adenoma, 7 fragments benign colonic mucosa, w/o high-grade dysplasia or malignancy per pathology) and one 3 mm possible polyp that was carpet-like and hard to distinguish from polypoid colonic mucosa (found to be an inflammatory polyp w/o high grade dysplasia or malignancy); a few Small & Large-mouthed Diverticula in sigmoid and descending colon.  Bone Density 01/19/2022  normal.   Vaccine Counseling: UTD on Flu,  Shingles 2/2, PNA, and Tdap.   Patient asking to rescind DNR which she had in place previously. I think she will need to consult her attorney for advice in this matter.   Annual wellness visit done today including the all of the following: Reviewed patient's Family Medical History Reviewed and updated list of patient's medical providers Assessment of  cognitive impairment was done Assessed patient's functional ability Established a written schedule for health screening services Health Risk Assessent Completed and Reviewed  Discussed health benefits of physical activity, and encouraged her to engage in regular exercise appropriate for her age and condition.    I,Emily Lagle,acting as a Neurosurgeon for Sylvan Evener, MD.,have documented all relevant documentation on the behalf of Sylvan Evener, MD,as directed by  Sylvan Evener, MD while in the presence of Sylvan Evener, MD.   I, Sylvan Evener, MD, have reviewed all documentation for this visit. The documentation on 02/13/24 for the exam, diagnosis, procedures, and orders are all accurate and complete.

## 2024-02-09 ENCOUNTER — Ambulatory Visit: Payer: Medicare Other

## 2024-02-09 VITALS — Ht 67.0 in | Wt 258.0 lb

## 2024-02-09 DIAGNOSIS — Z0000 Encounter for general adult medical examination without abnormal findings: Secondary | ICD-10-CM | POA: Diagnosis not present

## 2024-02-09 NOTE — Patient Instructions (Signed)
 Next appointment: Follow up in one year for your annual wellness visit    Preventive Care 65 Years and Older, Female Preventive care refers to lifestyle choices and visits with your health care provider that can promote health and wellness. What does preventive care include? A yearly physical exam. This is also called an annual well check. Dental exams once or twice a year. Routine eye exams. Ask your health care provider how often you should have your eyes checked. Personal lifestyle choices, including: Daily care of your teeth and gums. Regular physical activity. Eating a healthy diet. Avoiding tobacco and drug use. Limiting alcohol use. Practicing safe sex. Taking low-dose aspirin every day. Taking vitamin and mineral supplements as recommended by your health care provider. What happens during an annual well check? The services and screenings done by your health care provider during your annual well check will depend on your age, overall health, lifestyle risk factors, and family history of disease. Counseling  Your health care provider may ask you questions about your: Alcohol use. Tobacco use. Drug use. Emotional well-being. Home and relationship well-being. Sexual activity. Eating habits. History of falls. Memory and ability to understand (cognition). Work and work Astronomer. Reproductive health. Screening  You may have the following tests or measurements: Height, weight, and BMI. Blood pressure. Lipid and cholesterol levels. These may be checked every 5 years, or more frequently if you are over 1 years old. Skin check. Lung cancer screening. You may have this screening every year starting at age 59 if you have a 30-pack-year history of smoking and currently smoke or have quit within the past 15 years. Fecal occult blood test (FOBT) of the stool. You may have this test every year starting at age 58. Flexible sigmoidoscopy or colonoscopy. You may have a  sigmoidoscopy every 5 years or a colonoscopy every 10 years starting at age 51. Hepatitis C blood test. Hepatitis B blood test. Sexually transmitted disease (STD) testing. Diabetes screening. This is done by checking your blood sugar (glucose) after you have not eaten for a while (fasting). You may have this done every 1-3 years. Bone density scan. This is done to screen for osteoporosis. You may have this done starting at age 53. Mammogram. This may be done every 1-2 years. Talk to your health care provider about how often you should have regular mammograms. Talk with your health care provider about your test results, treatment options, and if necessary, the need for more tests. Vaccines  Your health care provider may recommend certain vaccines, such as: Influenza vaccine. This is recommended every year. Tetanus, diphtheria, and acellular pertussis (Tdap, Td) vaccine. You may need a Td booster every 10 years. Zoster vaccine. You may need this after age 55. Pneumococcal 13-valent conjugate (PCV13) vaccine. One dose is recommended after age 74. Pneumococcal polysaccharide (PPSV23) vaccine. One dose is recommended after age 92. Talk to your health care provider about which screenings and vaccines you need and how often you need them. This information is not intended to replace advice given to you by your health care provider. Make sure you discuss any questions you have with your health care provider. Document Released: 09/13/2015 Document Revised: 05/06/2016 Document Reviewed: 06/18/2015 Elsevier Interactive Patient Education  2017 [employer].  Fall Prevention in the Home Falls can cause injuries. They can happen to people of all ages. There are many things you can do to make your home safe and to help prevent falls. What can I do on the outside of  my home? Regularly fix the edges of walkways and driveways and fix any cracks. Remove anything that might make you trip as you walk through a  door, such as a raised step or threshold. Trim any bushes or trees on the path to your home. Use bright outdoor lighting. Clear any walking paths of anything that might make someone trip, such as rocks or tools. Regularly check to see if handrails are loose or broken. Make sure that both sides of any steps have handrails. Any raised decks and porches should have guardrails on the edges. Have any leaves, snow, or ice cleared regularly. Use sand or salt on walking paths during winter. Clean up any spills in your garage right away. This includes oil or grease spills. What can I do in the bathroom? Use night lights. Install grab bars by the toilet and in the tub and shower. Do not use towel bars as grab bars. Use non-skid mats or decals in the tub or shower. If you need to sit down in the shower, use a plastic, non-slip stool. Keep the floor dry. Clean up any water that spills on the floor as soon as it happens. Remove soap buildup in the tub or shower regularly. Attach bath mats securely with double-sided non-slip rug tape. Do not have throw rugs and other things on the floor that can make you trip. What can I do in the bedroom? Use night lights. Make sure that you have a light by your bed that is easy to reach. Do not use any sheets or blankets that are too big for your bed. They should not hang down onto the floor. Have a firm chair that has side arms. You can use this for support while you get dressed. Do not have throw rugs and other things on the floor that can make you trip. What can I do in the kitchen? Clean up any spills right away. Avoid walking on wet floors. Keep items that you use a lot in easy-to-reach places. If you need to reach something above you, use a strong step stool that has a grab bar. Keep electrical cords out of the way. Do not use floor polish or wax that makes floors slippery. If you must use wax, use non-skid floor wax. Do not have throw rugs and other things  on the floor that can make you trip. What can I do with my stairs? Do not leave any items on the stairs. Make sure that there are handrails on both sides of the stairs and use them. Fix handrails that are broken or loose. Make sure that handrails are as long as the stairways. Check any carpeting to make sure that it is firmly attached to the stairs. Fix any carpet that is loose or worn. Avoid having throw rugs at the top or bottom of the stairs. If you do have throw rugs, attach them to the floor with carpet tape. Make sure that you have a light switch at the top of the stairs and the bottom of the stairs. If you do not have them, ask someone to add them for you. What else can I do to help prevent falls? Wear shoes that: Do not have high heels. Have rubber bottoms. Are comfortable and fit you well. Are closed at the toe. Do not wear sandals. If you use a stepladder: Make sure that it is fully opened. Do not climb a closed stepladder. Make sure that both sides of the stepladder are locked into place. Ask  someone to hold it for you, if possible. Clearly mark and make sure that you can see: Any grab bars or handrails. First and last steps. Where the edge of each step is. Use tools that help you move around (mobility aids) if they are needed. These include: Canes. Walkers. Scooters. Crutches. Turn on the lights when you go into a dark area. Replace any light bulbs as soon as they burn out. Set up your furniture so you have a clear path. Avoid moving your furniture around. If any of your floors are uneven, fix them. If there are any pets around you, be aware of where they are. Review your medicines with your doctor. Some medicines can make you feel dizzy. This can increase your chance of falling. Ask your doctor what other things that you can do to help prevent falls. This information is not intended to replace advice given to you by your health care provider. Make sure you discuss any  questions you have with your health care provider. Document Released: 06/13/2009 Document Revised: 01/23/2016 Document Reviewed: 09/21/2014 Elsevier Interactive Patient Education  2017 [employer].

## 2024-02-09 NOTE — Progress Notes (Signed)
 Subjective:   Tara [name] is a 68 y.o. female who presents for Medicare Annual (Subsequent) preventive examination.  Visit Complete: Virtual I connected with  Kareen Osier on 02/09/24 by a audio enabled telemedicine application and verified that I am speaking with the correct person using two identifiers.  Patient Location: Home  Provider Location: Office/Clinic  I discussed the limitations of evaluation and management by telemedicine. The patient expressed understanding and agreed to proceed.  Vital Signs: Because this visit was a virtual/telehealth visit, some criteria may be missing or patient reported. Any vitals not documented were not able to be obtained and vitals that have been documented are patient reported.  Patient Medicare AWV questionnaire was completed by the patient on 02/09/2024; I have confirmed that all information answered by patient is correct and no changes since this date.  Cardiac Risk Factors include: advanced age (>82men, >61 women);diabetes mellitus;obesity (BMI >30kg/m2)     Objective:     Today's Vitals   02/09/24 1400  Weight: 258 lb (117 kg)  Height: 5' 7 (1.702 m)   Body mass index is 40.41 kg/m.     06/21/2023    6:44 AM 05/24/2023   10:55 AM 02/08/2023    2:01 PM 01/27/2022   11:05 AM 05/11/2020    9:24 AM 01/10/2019   10:56 AM 03/18/2018    4:58 PM  Advanced Directives  Does Patient Have a Medical Advance Directive? Yes Yes Yes Yes Yes Yes No  Type of Estate agent of [place];Living will Healthcare Power of [place];Living will Healthcare Power of [place];Living will Living will  Living will   Does patient want to make changes to medical advance directive? No - Patient declined  No - Patient declined No - Patient declined     Copy of Healthcare Power of Attorney in Chart? Yes - validated most recent copy scanned in chart (See row information) Yes - validated most recent copy scanned in chart (See row information)  No - copy requested      Would patient like information on creating a medical advance directive?       No - Patient declined    Current Medications (verified) Outpatient Encounter Medications as of 02/09/2024  Medication Sig   acetaminophen  (TYLENOL ) 500 MG tablet Take 500 mg by mouth every 6 (six) hours as needed for mild pain, moderate pain or headache.    ADVAIR  HFA 45-21 MCG/ACT inhaler Inhale 2 puffs into the lungs 2 (two) times daily.   albuterol  (VENTOLIN  HFA) 108 (90 Base) MCG/ACT inhaler Two puffs every 4 hours if needed for wheezing or coughing .  May use 2 puffs 5-15 minutes prior to exercise.   azelastine  (ASTELIN ) 0.1 % nasal spray Place 1-2 sprays into both nostrils 2 (two) times daily as needed.   benzonatate  (TESSALON ) 100 MG capsule Take 1 capsule (100 mg total) by mouth 3 (three) times daily as needed.   clobetasol  ointment (TEMOVATE ) 0.05 % Apply 1 Application topically 2 (two) times daily.   cyclobenzaprine  (FLEXERIL ) 10 MG tablet Take 1 tablet (10 mg total) by mouth 3 (three) times daily as needed for muscle spasms.   diphenhydramine -acetaminophen  (TYLENOL  PM) 25-500 MG TABS tablet Take 1 tablet by mouth at bedtime as needed (pain/sleep.).   famotidine  (PEPCID ) 20 MG tablet TAKE 1 TABLET BY MOUTH DAILY 2 TIMES A DAY. MUST KEEP OFFICE VISIT FOR FURTHER REFILLS   gabapentin  (NEURONTIN ) 100 MG capsule Take 100 mg by mouth 3 (three) times daily as  needed (pain.).   Glucosamine-Chondroitin (MOVE FREE PO) Take 1 tablet by mouth in the morning.   levothyroxine  (SYNTHROID ) 88 MCG tablet Take 1 tablet (88 mcg total) by mouth daily.   magnesium  oxide (MAG-OX) 400 (240 Mg) MG tablet Take 400 mg by mouth in the morning.   metFORMIN  (GLUCOPHAGE ) 500 MG tablet Take 1 tablet (500 mg total) by mouth 2 (two) times daily with a meal.   montelukast  (SINGULAIR ) 10 MG tablet TAKE 1 TABLET BY MOUTH AT BEDTIME   nystatin  (MYCOSTATIN ) 100000 UNIT/ML suspension Use as directed 5 mLs in the mouth or  throat 4 (four) times daily as needed (thrush/irritation (due to inhaler use)).   omeprazole  (PRILOSEC  OTC) 20 MG tablet Take 1 tablet (20 mg total) by mouth daily.   oxyCODONE  (OXY IR/ROXICODONE ) 5 MG immediate release tablet Take 1 tablet (5 mg total) by mouth every 6 (six) hours as needed (pain.).   Polyethyl Glycol-Propyl Glycol (SYSTANE) 0.4-0.3 % SOLN Place 1-2 drops into both eyes 3 (three) times daily as needed (dry/irritated eyes.).   Probiotic Product (PROBIOTIC PO) Take 1 capsule by mouth in the morning.   rosuvastatin  (CRESTOR ) 5 MG tablet Take 1 tablet (5 mg total) by mouth daily.   Spacer/Aero-Holding Chambers (AEROCHAMBER MV) inhaler Use as instructed   triamcinolone  ointment (KENALOG ) 0.1 % Apply 1 Application topically 2 (two) times daily.   VITAMIN D , CHOLECALCIFEROL, PO Take 10,000 Units by mouth daily.   metoprolol  tartrate (LOPRESSOR ) 25 MG tablet Take 0.5 tablets (12.5 mg total) by mouth 2 (two) times daily. (Patient taking differently: Take 12.5 mg by mouth 2 (two) times daily as needed (fast heart rate).)   [DISCONTINUED] omeprazole  (PRILOSEC ) 20 MG capsule Take 20 mg by mouth daily.   Facility-Administered Encounter Medications as of 02/09/2024  Medication   benralizumab  (FASENRA ) prefilled syringe 30 mg    Allergies (verified) Aspirin and Nsaids   History: Past Medical History:  Diagnosis Date   Allergy    Anxiety    Arthritis    Back pain    Chronic edema    Chronic hepatitis C without hepatic coma (HCC) 10/29/2012   Chronic pain syndrome 07/01/2018   Cirrhosis (HCC) 05/10/2017   Class 2 severe obesity with serious comorbidity and body mass index (BMI) of 37.0 to 37.9 in adult (HCC) 10/25/2018   Cold hands and feet    Complication of anesthesia    difficulty waking up/dizzy/lightheaded   Controlled type 2 diabetes mellitus without complication, without long-term current use of insulin  (HCC) 07/11/2018   Cough    Dry eye syndrome of both eyes 09/02/2022    Dysrhythmia, cardiac 09/28/2011   She has no records with her today. She tells me that a few years ago in [place] she had palpitations and a cardiology placed her on digoxin , she does not know what type of dysrhythmia. She has not had any palpitations recently     Environmental allergies    Eye pain    Fatigue    Gastroesophageal reflux disease 11/05/2015   H/O varicella    H/O varicose veins    Hepatic cirrhosis (HCC)    Hepatitis C    History of breast cancer 01/15/2020   History of cardiac arrhythmia 01/15/2020   History of chemotherapy 2013   left breast cancer   History of hepatitis C 01/15/2020   History of left hip replacement 08/29/2018   History of measles, mumps, or rubella    History of osteoporosis 01/15/2020   History of total  right hip replacement 08/29/2018   History of TTP (thrombotic thrombocytopenic purpura) 10/29/2012   Hx of radiation therapy 12/15/11 - 01/29/12   left breast   Hyperlipidemia    Hypokalemia 12/29/2013   Hypothyroidism    Intrinsic atopic dermatitis 01/15/2020   Irregular heart beat    under control   Joint pain    Leg cramps    Lymphedema of arm 12/15/2011   Malignant neoplasm of axillary tail of left breast in female, estrogen receptor positive (HCC) 08/12/2015   Malignant neoplasm of upper-outer quadrant of left breast in female, estrogen receptor positive (HCC) 07/17/2014   Moderate persistent asthma without complication 01/15/2020   Neuromuscular disorder (HCC)    left foot nerve damage- neuropathy    Oral candidiasis 09/02/2022   Osteopenia    Other allergic rhinitis 10/29/2012   Palpitations    Prediabetes 10/25/2018   Radiculopathy 07/2020   Rheumatoid arthritis (HCC)    Ringing in ear    Rosacea, acne 09/28/2011   Severe persistent asthma without complication 09/02/2022   Status post bilateral hip replacements 10/29/2012   Swallowing difficulty    Swelling of both lower extremities    Thrombocytopenia, primary (HCC)    TTP  (thrombotic thrombocytopenic purpura) (HCC)    1982   Vitamin D  deficiency 10/25/2018   Past Surgical History:  Procedure Laterality Date   BREAST LUMPECTOMY  08/03/11   left lumpectomy and slnbx,T1cN0,triple pos   CATARACT EXTRACTION, BILATERAL     COLONOSCOPY  01/26/2019   COLONOSCOPY  08/12/2020   elbow pins Left    FOOT SURGERY Left    multiple   POLYPECTOMY     PORTACATH PLACEMENT  08/03/2011   Procedure: INSERTION PORT-A-CATH;  Surgeon: Mayme Spearman, MD;  Location: [facility] OR;  Service: General;  Laterality: Right;   portacath removal     THORACIC DISCECTOMY Left 06/21/2023   Procedure: Laminectomy and Foraminotomy - left - Thoracic eleven-Thoracic twelve;  Surgeon: Agustina Aldrich, MD;  Location: [facility] OR;  Service: Neurosurgery;  Laterality: Left;   TOTAL HIP ARTHROPLASTY Bilateral    UPPER GASTROINTESTINAL ENDOSCOPY     UPPER GASTROINTESTINAL ENDOSCOPY  08/12/2020   Family History  Problem Relation Age of Onset   Pneumonia Mother    COPD Mother    Colon polyps Mother    Heart disease Mother    Thyroid  disease Mother    Obesity Mother    COPD Father    Obesity Father    Brain cancer Maternal Grandfather    Alcohol  abuse Maternal Grandfather    Hyperlipidemia Neg Hx    Hypertension Neg Hx    Colon cancer Neg Hx    Rectal cancer Neg Hx    Stomach cancer Neg Hx    Esophageal cancer Neg Hx    Social History   Socioeconomic History   Marital status: Married    Spouse name: Tim   Number of children: 0   Years of education: Not on file   Highest education level: Bachelor's degree (e.g., BA, AB, BS)  Occupational History   Occupation: Retired  Tobacco Use   Smoking status: Never   Smokeless tobacco: Never  Vaping Use   Vaping status: Never Used  Substance and Sexual Activity   Alcohol  use: Yes    Alcohol /week: 1.0 standard drink of alcohol     Types: 1 Glasses of wine per week    Comment: occ   Drug use: No   Sexual activity: Not on file  Other Topics Concern  Not on file  Social History Narrative   Not on file   Social Drivers of Health   Financial Resource Strain: Low Risk  (02/08/2024)   Overall Financial Resource Strain (CARDIA)    Difficulty of Paying Living Expenses: Not hard at all  Food Insecurity: No Food Insecurity (02/08/2024)   Hunger Vital Sign    Worried About Running Out of Food in the Last Year: Never true    Ran Out of Food in the Last Year: Never true  Transportation Needs: No Transportation Needs (02/08/2024)   PRAPARE - Administrator, Civil Service (Medical): No    Lack of Transportation (Non-Medical): No  Physical Activity: Insufficiently Active (02/08/2024)   Exercise Vital Sign    Days of Exercise per Week: 2 days    Minutes of Exercise per Session: 30 min  Stress: No Stress Concern Present (02/08/2024)   [employer] of Occupational Health - Occupational Stress Questionnaire    Feeling of Stress : Only a little  Social Connections: Moderately Isolated (02/08/2024)   Social Connection and Isolation Panel [NHANES]    Frequency of Communication with Friends and Family: Once a week    Frequency of Social Gatherings with Friends and Family: Once a week    Attends Religious Services: 1 to 4 times per year    Active Member of [employer] or Organizations: No    Attends Banker Meetings: Never    Marital Status: Married    Tobacco Counseling Counseling given: Not Answered   Clinical Intake:                        Activities of Daily Living    02/09/2024    1:59 PM 02/08/2024    6:02 PM  In your present state of health, do you have any difficulty performing the following activities:  Hearing? 0 0  Vision? 0 0  Difficulty concentrating or making decisions? 0 0  Walking or climbing stairs? 1 1  Dressing or bathing? 0 0  Doing errands, shopping? 1 1  Preparing Food and eating ? N N  Using the Toilet? N N  In the past six months, have you accidently leaked urine? N Y  Do you  have problems with loss of bowel control? N N  Managing your Medications? N N  Managing your Finances? N N  Housekeeping or managing your Housekeeping? Colie Dawes    Patient Care Team: Sylvan Evener, MD as PCP - General (Internal Medicine) Aminta Kales, MD as Consulting Physician (Ophthalmology)  Indicate any recent Medical Services you may have received from other than [facility] providers in the past year (date may be approximate).     Assessment:    This is a routine wellness examination for [facility].  Hearing/Vision screen No results found.   Goals Addressed   None    Depression Screen    02/09/2024    2:06 PM 02/08/2023    2:01 PM 01/07/2023    2:31 PM 08/13/2022   11:38 AM 01/27/2022   11:05 AM 01/23/2021   11:08 AM 05/27/2020   11:59 AM  PHQ 2/9 Scores  PHQ - 2 Score 0 0 0 0 0 0 0  PHQ- 9 Score       0    Fall Risk    02/09/2024    2:00 PM 02/08/2024    6:02 PM 02/08/2023    2:01 PM 02/08/2023   10:18 AM 01/07/2023  2:31 PM  Fall Risk   Falls in the past year? 0 0 0 0 0  Number falls in past yr: 0 0 0 0 0  Injury with Fall? 0 0 0 0   Risk for fall due to : No Fall Risks  No Fall Risks No Fall Risks No Fall Risks  Follow up Falls prevention discussed;Education provided;Falls evaluation completed    Falls prevention discussed    MEDICARE RISK AT HOME: Medicare Risk at Home Any stairs in or around the home?: No If so, are there any without handrails?: No Home free of loose throw rugs in walkways, pet beds, electrical cords, etc?: No Adequate lighting in your home to reduce risk of falls?: Yes Life alert?: No Use of a cane, walker or w/c?: No Grab bars in the bathroom?: No Shower chair or bench in shower?: Yes Elevated toilet seat or a handicapped toilet?: Yes  TIMED UP AND GO:  Was the test performed?  No    Cognitive Function:        02/08/2023    2:02 PM 01/27/2022   11:06 AM  6CIT Screen  What Year? 0 points 0 points  What month? 0 points 0 points  What  time? 0 points 0 points  Count back from 20 0 points 0 points  Months in reverse 0 points   Repeat phrase 0 points   Total Score 0 points     Immunizations Immunization History  Administered Date(s) Administered   DTaP 05/17/2007   Fluad Quad(high Dose 65+) 06/02/2019, 05/14/2022   Hep A / Hep B 06/14/2018, 07/15/2018   Hepatitis A, Adult 09/17/2021   Hepb-cpg 09/17/2021, 10/20/2021   Influenza Split 05/31/2012   Influenza Whole 07/14/2011   Influenza, Quadrivalent, Recombinant, Inj, Pf 05/05/2019   Influenza,inj,Quad PF,6+ Mos 06/06/2013, 06/25/2014, 07/01/2015, 07/02/2016, 05/17/2020   Influenza-Unspecified 05/25/2017, 03/31/2018, 06/09/2021, 06/20/2022   PFIZER(Purple Top)SARS-COV-2 Vaccination 09/27/2019, 10/11/2019, 05/21/2020, 12/13/2020   Pfizer Covid-19 Vaccine Bivalent Booster 51yrs & up 08/20/2021, 07/10/2022   Pneumococcal Conjugate-13 08/01/2015, 01/23/2020   Pneumococcal Polysaccharide-23 04/25/2013, 01/23/2021   Respiratory Syncytial Virus Vaccine,Recomb Aduvanted(Arexvy) 02/10/2023   Tdap 09/05/2015   Zoster Recombinant(Shingrix) 09/23/2021, 02/21/2022    TDAP status: Up to date  Flu Vaccine status: Up to date  Pneumococcal vaccine status: Up to date  Covid-19 vaccine status: Completed vaccines  Qualifies for Shingles Vaccine? Yes   Zostavax completed Yes   Shingrix Completed?: Yes  Screening Tests Health Maintenance  Topic Date Due   Colonoscopy  08/13/2023   OPHTHALMOLOGY EXAM  02/09/2024 (Originally 10/03/2023)   INFLUENZA VACCINE  03/31/2024   HEMOGLOBIN A1C  08/01/2024   FOOT EXAM  08/08/2024   Diabetic kidney evaluation - eGFR measurement  01/30/2025   Diabetic kidney evaluation - Urine ACR  01/30/2025   MAMMOGRAM  01/30/2025   Medicare Annual Wellness (AWV)  01/30/2025   DTaP/Tdap/Td (3 - Td or Tdap) 09/04/2025   Pneumonia Vaccine 55+ Years old  Completed   DEXA SCAN  Completed   Hepatitis C Screening  Completed   Zoster Vaccines- Shingrix   Completed   HPV VACCINES  Aged Out   Meningococcal B Vaccine  Aged Out   COVID-19 Vaccine  Discontinued    Health Maintenance  Health Maintenance Due  Topic Date Due   Colonoscopy  08/13/2023    Colorectal cancer screening: Referral to GI placed 02/09/24. Pt aware the office will call re: appt.  Mammogram status: Completed 01/31/2024. Repeat every year  Bone Density status: Completed 01/19/2022.  Results reflect: Bone density results: NORMAL. Repeat every   years.  Lung Cancer Screening: (Low Dose CT Chest recommended if Age 57-80 years, 20 pack-year currently smoking OR have quit w/in 15years.) does not qualify.   Additional Screening:  Hepatitis C Screening: does not qualify; Completed   Vision Screening: Recommended annual ophthalmology exams for early detection of glaucoma and other disorders of the eye. Is the patient up to date with their annual eye exam?  Yes  Who is the provider or what is the name of the office in which the patient attends annual eye exams? [facility] If pt is not established with a provider, would they like to be referred to a provider to establish care? No .   Dental Screening: Recommended annual dental exams for proper oral hygiene  Diabetic Foot Exam: Diabetic Foot Exam: Completed    Community Resource Referral / Chronic Care Management: CRR required this visit?  No   CCM required this visit?  No     Plan:     I have personally reviewed and noted the following in the patient's chart:   Medical and social history Use of alcohol , tobacco or illicit drugs  Current medications and supplements including opioid prescriptions. Patient is not currently taking opioid prescriptions. Functional ability and status Nutritional status Physical activity Advanced directives List of other physicians Hospitalizations, surgeries, and ER visits in previous 12 months Vitals Screenings to include cognitive, depression, and falls Referrals and  appointments  In addition, I have reviewed and discussed with patient certain preventive protocols, quality metrics, and best practice recommendations. A written personalized care plan for preventive services as well as general preventive health recommendations were provided to patient.     Abdelrahman Nair Charlestine Conquest, CMA   02/09/2024   After Visit Summary: (MyChart) Due to this being a telephonic visit, the after visit summary with patients personalized plan was offered to patient via MyChart

## 2024-02-10 ENCOUNTER — Encounter: Payer: Self-pay | Admitting: Internal Medicine

## 2024-02-10 ENCOUNTER — Ambulatory Visit: Admitting: Internal Medicine

## 2024-02-10 VITALS — BP 128/80 | HR 108 | Ht 67.0 in | Wt 269.0 lb

## 2024-02-10 DIAGNOSIS — E785 Hyperlipidemia, unspecified: Secondary | ICD-10-CM

## 2024-02-10 DIAGNOSIS — J4520 Mild intermittent asthma, uncomplicated: Secondary | ICD-10-CM

## 2024-02-10 DIAGNOSIS — K219 Gastro-esophageal reflux disease without esophagitis: Secondary | ICD-10-CM

## 2024-02-10 DIAGNOSIS — J45909 Unspecified asthma, uncomplicated: Secondary | ICD-10-CM

## 2024-02-10 DIAGNOSIS — Z6837 Body mass index (BMI) 37.0-37.9, adult: Secondary | ICD-10-CM

## 2024-02-10 DIAGNOSIS — L209 Atopic dermatitis, unspecified: Secondary | ICD-10-CM

## 2024-02-10 DIAGNOSIS — Z8619 Personal history of other infectious and parasitic diseases: Secondary | ICD-10-CM

## 2024-02-10 DIAGNOSIS — E1169 Type 2 diabetes mellitus with other specified complication: Secondary | ICD-10-CM

## 2024-02-10 DIAGNOSIS — G250 Essential tremor: Secondary | ICD-10-CM

## 2024-02-10 DIAGNOSIS — K3184 Gastroparesis: Secondary | ICD-10-CM

## 2024-02-10 DIAGNOSIS — R251 Tremor, unspecified: Secondary | ICD-10-CM | POA: Diagnosis not present

## 2024-02-10 DIAGNOSIS — F419 Anxiety disorder, unspecified: Secondary | ICD-10-CM

## 2024-02-10 DIAGNOSIS — I499 Cardiac arrhythmia, unspecified: Secondary | ICD-10-CM | POA: Diagnosis not present

## 2024-02-10 DIAGNOSIS — Z0279 Encounter for issue of other medical certificate: Secondary | ICD-10-CM

## 2024-02-10 DIAGNOSIS — E669 Obesity, unspecified: Secondary | ICD-10-CM

## 2024-02-10 DIAGNOSIS — E039 Hypothyroidism, unspecified: Secondary | ICD-10-CM

## 2024-02-10 DIAGNOSIS — F411 Generalized anxiety disorder: Secondary | ICD-10-CM

## 2024-02-10 DIAGNOSIS — R7302 Impaired glucose tolerance (oral): Secondary | ICD-10-CM

## 2024-02-10 DIAGNOSIS — Z853 Personal history of malignant neoplasm of breast: Secondary | ICD-10-CM

## 2024-02-10 DIAGNOSIS — M5414 Radiculopathy, thoracic region: Secondary | ICD-10-CM

## 2024-02-11 ENCOUNTER — Ambulatory Visit: Payer: Self-pay

## 2024-02-11 NOTE — Telephone Encounter (Signed)
 Message sent to patient via MyChart.

## 2024-02-11 NOTE — Telephone Encounter (Signed)
 FYI Only or Action Required?: Action required by provider  Patient was last seen in primary care on 02/10/2024 by Sylvan Evener, MD. Called Nurse Triage reporting Rash. Symptoms began a week ago. Interventions attempted: Nothing. Symptoms are: unchanged.  Triage Disposition: Home Care  Patient/caregiver understands and will follow disposition?: Yes      Copied from CRM [id number]. Topic: Clinical - Red Word Triage >> Feb 11, 2024 10:50 AM Ivette P wrote: Red Word that prompted transfer to Nurse Triage: pt was seen for a rash, having itchiness and pain. Wanted to know about medication was going to be sent in or would it be OTC Reason for Disposition  Mild localized rash  Answer Assessment - Initial Assessment Questions 1. APPEARANCE of RASH: Describe the rash.      Tiny bumps 2. LOCATION: Where is the rash located?      chest 3. NUMBER: How many spots are there?      1 4. SIZE: How big are the spots? (Inches, centimeters or compare to size of a coin)      1 1/2 in   to   2 ins 5. ONSET: When did the rash start?      A week 6. ITCHING: Does the rash itch? If Yes, ask: How bad is the itch?  (Scale 0-10; or none, mild, moderate, severe)     mild 7. PAIN: Does the rash hurt? If Yes, ask: How bad is the pain?  (Scale 0-10; or none, mild, moderate, severe)    - NONE (0): no pain    - MILD (1-3): doesn't interfere with normal activities     - MODERATE (4-7): interferes with normal activities or awakens from sleep     - SEVERE (8-10): excruciating pain, unable to do any normal activities     no 8. OTHER SYMPTOMS: Do you have any other symptoms? (e.g., fever)     no  Protocols used: Rash or Redness - Localized-A-AH

## 2024-02-13 ENCOUNTER — Encounter: Payer: Self-pay | Admitting: Internal Medicine

## 2024-02-13 NOTE — Patient Instructions (Addendum)
 We are referring you to Neurology for evaluation of tremor. Please try to resume diet and exercise efforts. Suggest consulting your attorney about your DNR document that you want to rescind. Return in December for Medicare wellness visit and physical exam. No change in meds.

## 2024-02-16 LAB — HM DIABETES EYE EXAM

## 2024-02-17 ENCOUNTER — Ambulatory Visit: Payer: Self-pay | Admitting: Internal Medicine

## 2024-02-28 ENCOUNTER — Other Ambulatory Visit: Payer: Self-pay

## 2024-02-28 DIAGNOSIS — E038 Other specified hypothyroidism: Secondary | ICD-10-CM

## 2024-02-28 MED ORDER — LEVOTHYROXINE SODIUM 88 MCG PO TABS: 88.0000 ug | ORAL_TABLET | Freq: Every day | ORAL | 1 refills | Status: AC

## 2024-04-04 ENCOUNTER — Ambulatory Visit

## 2024-04-04 DIAGNOSIS — J4550 Severe persistent asthma, uncomplicated: Secondary | ICD-10-CM | POA: Diagnosis not present

## 2024-04-13 ENCOUNTER — Other Ambulatory Visit: Payer: Self-pay | Admitting: Internal Medicine

## 2024-04-17 ENCOUNTER — Ambulatory Visit (INDEPENDENT_AMBULATORY_CARE_PROVIDER_SITE_OTHER)

## 2024-04-17 ENCOUNTER — Ambulatory Visit (INDEPENDENT_AMBULATORY_CARE_PROVIDER_SITE_OTHER): Admitting: Podiatry

## 2024-04-17 ENCOUNTER — Other Ambulatory Visit: Payer: Self-pay | Admitting: *Deleted

## 2024-04-17 VITALS — Ht 67.0 in | Wt 269.0 lb

## 2024-04-17 DIAGNOSIS — M7752 Other enthesopathy of left foot: Secondary | ICD-10-CM | POA: Diagnosis not present

## 2024-04-17 NOTE — Progress Notes (Signed)
 Chief Complaint  Patient presents with   Toe Pain    Pt is here due to left 2nd toe pain states she stump her toe thinks it could be broken, has had previous surgeries to correct hammertoes, also has a corn on the right foot that she would like to have shaved.    HPI: 68 y.o. female presenting today for evaluation of an injury to the left second toe.  History of prior hammertoe corrective surgery.  She is concerned that possibly she broke the toe or hardware.  It is very painful and tender  Past Medical History:  Diagnosis Date   Allergy    Anxiety    Arthritis    Back pain    Chronic edema    Chronic hepatitis C without hepatic coma (HCC) 10/29/2012   Chronic pain syndrome 07/01/2018   Cirrhosis (HCC) 05/10/2017   Class 2 severe obesity with serious comorbidity and body mass index (BMI) of 37.0 to 37.9 in adult (HCC) 10/25/2018   Cold hands and feet    Complication of anesthesia    difficulty waking up/dizzy/lightheaded   Controlled type 2 diabetes mellitus without complication, without long-term current use of insulin  (HCC) 07/11/2018   Cough    Dry eye syndrome of both eyes 09/02/2022   Dysrhythmia, cardiac 09/28/2011   She has no records with her today. She tells me that a few years ago in [place] she had palpitations and a cardiology placed her on digoxin , she does not know what type of dysrhythmia. She has not had any palpitations recently     Environmental allergies    Eye pain    Fatigue    Gastroesophageal reflux disease 11/05/2015   H/O varicella    H/O varicose veins    Hepatic cirrhosis (HCC)    Hepatitis C    History of breast cancer 01/15/2020   History of cardiac arrhythmia 01/15/2020   History of chemotherapy 2013   left breast cancer   History of hepatitis C 01/15/2020   History of left hip replacement 08/29/2018   History of measles, mumps, or rubella    History of osteoporosis 01/15/2020   History of total right hip replacement 08/29/2018   History of TTP  (thrombotic thrombocytopenic purpura) 10/29/2012   Hx of radiation therapy 12/15/11 - 01/29/12   left breast   Hyperlipidemia    Hypokalemia 12/29/2013   Hypothyroidism    Intrinsic atopic dermatitis 01/15/2020   Irregular heart beat    under control   Joint pain    Leg cramps    Lymphedema of arm 12/15/2011   Malignant neoplasm of axillary tail of left breast in female, estrogen receptor positive (HCC) 08/12/2015   Malignant neoplasm of upper-outer quadrant of left breast in female, estrogen receptor positive (HCC) 07/17/2014   Moderate persistent asthma without complication 01/15/2020   Neuromuscular disorder (HCC)    left foot nerve damage- neuropathy    Oral candidiasis 09/02/2022   Osteopenia    Other allergic rhinitis 10/29/2012   Palpitations    Prediabetes 10/25/2018   Radiculopathy 07/2020   Rheumatoid arthritis (HCC)    Ringing in ear    Rosacea, acne 09/28/2011   Severe persistent asthma without complication 09/02/2022   Status post bilateral hip replacements 10/29/2012   Swallowing difficulty    Swelling of both lower extremities    Thrombocytopenia, primary (HCC)    TTP (thrombotic thrombocytopenic purpura) (HCC)    1982   Vitamin D  deficiency 10/25/2018    Past  Surgical History:  Procedure Laterality Date   BREAST LUMPECTOMY  08/03/11   left lumpectomy and slnbx,T1cN0,triple pos   CATARACT EXTRACTION, BILATERAL     COLONOSCOPY  01/26/2019   COLONOSCOPY  08/12/2020   elbow pins Left    FOOT SURGERY Left    multiple   POLYPECTOMY     PORTACATH PLACEMENT  08/03/2011   Procedure: INSERTION PORT-A-CATH;  Surgeon: Deward GORMAN Curvin DOUGLAS, MD;  Location: [facility] OR;  Service: General;  Laterality: Right;   portacath removal     THORACIC DISCECTOMY Left 06/21/2023   Procedure: Laminectomy and Foraminotomy - left - Thoracic eleven-Thoracic twelve;  Surgeon: Louis Shove, MD;  Location: [facility] OR;  Service: Neurosurgery;  Laterality: Left;   TOTAL HIP ARTHROPLASTY Bilateral     UPPER GASTROINTESTINAL ENDOSCOPY     UPPER GASTROINTESTINAL ENDOSCOPY  08/12/2020    Allergies  Allergen Reactions   Aspirin Other (See Comments)    Pt has a hx of TTP.    Nsaids Other (See Comments)    Pt has a hx of TTP.      Physical Exam: General: The patient is alert and oriented x3 in no acute distress.  Dermatology: Skin is warm, dry and supple bilateral lower extremities.   Vascular: Palpable pedal pulses bilaterally. Capillary refill within normal limits.  No appreciable edema.  No erythema.  Neurological: Grossly intact via light touch  Musculoskeletal Exam: No pedal deformities noted.  There is some tenderness around the second toe but overall good alignment of the toe  Radiographic Exam LT foot 04/17/2024:  Orthopedic screws noted throughout the phalanges of the lesser digits as well as an obliquely oriented screw through the proximal phalanx of the great toe.  Overall good alignment of the toes.  No fractures identified.  Hardware intact.  Assessment/Plan of Care: 1.  Contusion left second toe 2.  History of left forefoot surgery  -Patient evaluated.  X-rays reviewed -Reassured the patient that I am unable to identify any fracture or complication of the hardware to the toes from prior surgery.  She is very relieved -Recommend conservative care including RICE -Return to clinic PRN       Thresa EMERSON Sar, DPM [facility]  Dr. Thresa EMERSON Sar, DPM    [address] [place], [place] [address]                Office [phone]  Fax [phone]

## 2024-04-18 ENCOUNTER — Encounter: Payer: Self-pay | Admitting: Neurology

## 2024-04-18 ENCOUNTER — Ambulatory Visit (INDEPENDENT_AMBULATORY_CARE_PROVIDER_SITE_OTHER): Admitting: Neurology

## 2024-04-18 VITALS — BP 147/84 | HR 89 | Ht 66.0 in | Wt 272.8 lb

## 2024-04-18 DIAGNOSIS — I890 Lymphedema, not elsewhere classified: Secondary | ICD-10-CM

## 2024-04-18 DIAGNOSIS — R251 Tremor, unspecified: Secondary | ICD-10-CM | POA: Diagnosis not present

## 2024-04-18 DIAGNOSIS — R0683 Snoring: Secondary | ICD-10-CM

## 2024-04-18 DIAGNOSIS — R351 Nocturia: Secondary | ICD-10-CM

## 2024-04-18 DIAGNOSIS — G20C Parkinsonism, unspecified: Secondary | ICD-10-CM

## 2024-04-18 DIAGNOSIS — M7989 Other specified soft tissue disorders: Secondary | ICD-10-CM | POA: Diagnosis not present

## 2024-04-18 DIAGNOSIS — Z9189 Other specified personal risk factors, not elsewhere classified: Secondary | ICD-10-CM

## 2024-04-18 NOTE — Patient Instructions (Addendum)
 We will proceed with a sleep study to rule out obstructive sleep apnea.  Treating sleep apnea and sleeping better may help your tremors.   Please reduce your caffeine intake and limit yourself to 8 to 16 ounces of caffeine altogether per day.  Caffeine can exacerbate any tremor condition. We will proceed with a DaT scan: This is a specialized brain scan designed to help with diagnosis of tremor disorders. A radioactive marker gets injected and the uptake is measured in the brain and compared to normal controls and right side is compared to the left, a change in uptake can help with diagnosis of certain tremor disorders. A brain MRI on the other hand is a brain scan that helps look at the brain structure in more detail overall and look for age-related changes, blood vessel related changes and look for stroke and volume loss which we call atrophy.  Please consider using your walker consistently as your gait is not very stable. We will plan a follow-up after testing.  We will keep you posted as to your test results by phone call and/or MyChart message as well.

## 2024-04-18 NOTE — Progress Notes (Signed)
 Subjective:    Patient ID: Tara [name] is a 68 y.o. female.  HPI    True Mar, MD, PhD [facility] [address] [address] [place], [place] [address]  Dear Dr. Perri,  I saw your patient, Tara [name], upon your kind request in my neurologic clinic today for evaluation of her tremors.  The patient is accompanied by her husband today.  As you know, Tara [name] is a 68 year old female with an underlying complex and complicated medical history of asthma, hypothyroidism, reflux disease, diabetes, hyperlipidemia, history of breast cancer with status postsurgery and chemoradiation, anxiety, depression, history of hepatitis C in the context of blood transfusion, with a history of TTP, chronic liver disease, chronic back pain, rheumatoid arthritis, status post multiple surgeries including breast lumpectomy, cataract extractions, left foot surgery, polypectomy, bilateral total hip arthroplasties, chronic edema, chronic pain syndrome, and severe obesity with a BMI of over 40, who reports an approximately 2 year history of tremor affecting primarily her left upper extremity, less so on the right.  She reports a change in her handwriting particularly trembling but not necessarily a smaller handwriting.  She has no family history of tremors or Parkinson's disease.  She does drink quite a bit of caffeine in the form of coffee, about 32 ounces per day and diet soda occasionally, maybe up to 3 times a day and she tries to hydrate well with water.  She has a history of paroxysmal A-fib.  She had a sleep study several years ago but reports no history of sleep apnea.  She snores per husband.  She has sleep disruption, she has nocturia at least 2 or 3 times per average night.  She sleeps on her stomach.  She does not drink any alcohol . I reviewed your office note from 02/13/2024.  She is on a beta-blocker, namely metoprolol .  He is on multiple medications, she sees Dr. Letha for  her back pain and prior to that had seen Dr. Leopoldo with neurology.  She was on narcotic pain medication with oxycodone  as needed, but is no longer on it, after her back surgery in October 2024.  She does take Flexeril  3 times a day as needed and gabapentin  as needed. She had blood work on 01/31/2024 including TSH which was normal at 3.26.   She has gained weight after her back surgery since she has not been as active.  She has had a change in her posture.  She walks very slowly.  She has a walker but does not use it all the time.  Her Past Medical History Is Significant For: Past Medical History:  Diagnosis Date   Allergy    Anxiety    Arthritis    Back pain    Chronic edema    Chronic hepatitis C without hepatic coma (HCC) 10/29/2012   Chronic pain syndrome 07/01/2018   Cirrhosis (HCC) 05/10/2017   Class 2 severe obesity with serious comorbidity and body mass index (BMI) of 37.0 to 37.9 in adult (HCC) 10/25/2018   Cold hands and feet    Complication of anesthesia    difficulty waking up/dizzy/lightheaded   Controlled type 2 diabetes mellitus without complication, without long-term current use of insulin  (HCC) 07/11/2018   Cough    Dry eye syndrome of both eyes 09/02/2022   Dysrhythmia, cardiac 09/28/2011   She has no records with her today. She tells me that a few years ago in [place] she had palpitations and a cardiology placed her  on digoxin , she does not know what type of dysrhythmia. She has not had any palpitations recently     Environmental allergies    Eye pain    Fatigue    Gastroesophageal reflux disease 11/05/2015   H/O varicella    H/O varicose veins    Hepatic cirrhosis (HCC)    Hepatitis C    History of breast cancer 01/15/2020   History of cardiac arrhythmia 01/15/2020   History of chemotherapy 2013   left breast cancer   History of hepatitis C 01/15/2020   History of left hip replacement 08/29/2018   History of measles, mumps, or rubella    History of osteoporosis  01/15/2020   History of total right hip replacement 08/29/2018   History of TTP (thrombotic thrombocytopenic purpura) 10/29/2012   Hx of radiation therapy 12/15/11 - 01/29/12   left breast   Hyperlipidemia    Hypokalemia 12/29/2013   Hypothyroidism    Intrinsic atopic dermatitis 01/15/2020   Irregular heart beat    under control   Joint pain    Leg cramps    Lymphedema of arm 12/15/2011   Malignant neoplasm of axillary tail of left breast in female, estrogen receptor positive (HCC) 08/12/2015   Malignant neoplasm of upper-outer quadrant of left breast in female, estrogen receptor positive (HCC) 07/17/2014   Moderate persistent asthma without complication 01/15/2020   Neuromuscular disorder (HCC)    left foot nerve damage- neuropathy    Oral candidiasis 09/02/2022   Osteopenia    Other allergic rhinitis 10/29/2012   Palpitations    Prediabetes 10/25/2018   Radiculopathy 07/2020   Rheumatoid arthritis (HCC)    Ringing in ear    Rosacea, acne 09/28/2011   Severe persistent asthma without complication 09/02/2022   Status post bilateral hip replacements 10/29/2012   Swallowing difficulty    Swelling of both lower extremities    Thrombocytopenia, primary (HCC)    TTP (thrombotic thrombocytopenic purpura) (HCC)    1982   Vitamin D  deficiency 10/25/2018    Her Past Surgical History Is Significant For: Past Surgical History:  Procedure Laterality Date   BREAST LUMPECTOMY  08/03/11   left lumpectomy and slnbx,T1cN0,triple pos   CATARACT EXTRACTION, BILATERAL     COLONOSCOPY  01/26/2019   COLONOSCOPY  08/12/2020   elbow pins Left    FOOT SURGERY Left    multiple   POLYPECTOMY     PORTACATH PLACEMENT  08/03/2011   Procedure: INSERTION PORT-A-CATH;  Surgeon: Deward GORMAN Curvin DOUGLAS, MD;  Location: [facility] OR;  Service: General;  Laterality: Right;   portacath removal     THORACIC DISCECTOMY Left 06/21/2023   Procedure: Laminectomy and Foraminotomy - left - Thoracic eleven-Thoracic twelve;   Surgeon: Louis Shove, MD;  Location: [facility] OR;  Service: Neurosurgery;  Laterality: Left;   TOTAL HIP ARTHROPLASTY Bilateral    UPPER GASTROINTESTINAL ENDOSCOPY     UPPER GASTROINTESTINAL ENDOSCOPY  08/12/2020    Her Family History Is Significant For: Family History  Problem Relation Age of Onset   Pneumonia Mother    COPD Mother    Colon polyps Mother    Heart disease Mother    Thyroid  disease Mother    Obesity Mother    COPD Father    Obesity Father    Brain cancer Maternal Grandfather    Alcohol  abuse Maternal Grandfather    Hyperlipidemia Neg Hx    Hypertension Neg Hx    Colon cancer Neg Hx    Rectal cancer Neg Hx  Stomach cancer Neg Hx    Esophageal cancer Neg Hx    Parkinson's disease Neg Hx     Her Social History Is Significant For: Social History   Socioeconomic History   Marital status: Married    Spouse name: Tim   Number of children: 0   Years of education: Not on file   Highest education level: Bachelor's degree (e.g., BA, AB, BS)  Occupational History   Occupation: Retired  Tobacco Use   Smoking status: Never   Smokeless tobacco: Never  Vaping Use   Vaping status: Never Used  Substance and Sexual Activity   Alcohol  use: Yes    Alcohol /week: 1.0 standard drink of alcohol     Types: 1 Glasses of wine per week    Comment: occ   Drug use: No   Sexual activity: Not on file  Other Topics Concern   Not on file  Social History Narrative   Pt lives with husband    Retired    Chief Executive Officer Drivers of Corporate investment banker Strain: Low Risk  (02/08/2024)   Overall Financial Resource Strain (CARDIA)    Difficulty of Paying Living Expenses: Not hard at all  Food Insecurity: No Food Insecurity (02/08/2024)   Hunger Vital Sign    Worried About Running Out of Food in the Last Year: Never true    Ran Out of Food in the Last Year: Never true  Transportation Needs: No Transportation Needs (02/08/2024)   PRAPARE - Administrator, Civil Service  (Medical): No    Lack of Transportation (Non-Medical): No  Physical Activity: Insufficiently Active (02/08/2024)   Exercise Vital Sign    Days of Exercise per Week: 2 days    Minutes of Exercise per Session: 30 min  Stress: No Stress Concern Present (02/08/2024)   [employer] of Occupational Health - Occupational Stress Questionnaire    Feeling of Stress : Only a little  Social Connections: Moderately Isolated (02/08/2024)   Social Connection and Isolation Panel    Frequency of Communication with Friends and Family: Once a week    Frequency of Social Gatherings with Friends and Family: Once a week    Attends Religious Services: 1 to 4 times per year    Active Member of [employer] or Organizations: No    Attends Banker Meetings: Never    Marital Status: Married    Her Allergies Are:  Allergies  Allergen Reactions   Aspirin Other (See Comments)    Pt has a hx of TTP.    Nsaids Other (See Comments)    Pt has a hx of TTP.   :   Her Current Medications Are:  Outpatient Encounter Medications as of 04/18/2024  Medication Sig   acetaminophen  (TYLENOL ) 500 MG tablet Take 500 mg by mouth every 6 (six) hours as needed for mild pain, moderate pain or headache.    ADVAIR  HFA 45-21 MCG/ACT inhaler Inhale 2 puffs into the lungs 2 (two) times daily.   albuterol  (VENTOLIN  HFA) 108 (90 Base) MCG/ACT inhaler Two puffs every 4 hours if needed for wheezing or coughing .  May use 2 puffs 5-15 minutes prior to exercise.   azelastine  (ASTELIN ) 0.1 % nasal spray Place 1-2 sprays into both nostrils 2 (two) times daily as needed.   diphenhydramine -acetaminophen  (TYLENOL  PM) 25-500 MG TABS tablet Take 1 tablet by mouth at bedtime as needed (pain/sleep.).   famotidine  (PEPCID ) 20 MG tablet TAKE 1 TABLET BY MOUTH DAILY 2 TIMES A DAY.  MUST KEEP OFFICE VISIT FOR FURTHER REFILLS   gabapentin  (NEURONTIN ) 100 MG capsule Take 100 mg by mouth 3 (three) times daily as needed (pain.).    Glucosamine-Chondroitin (MOVE FREE PO) Take 1 tablet by mouth in the morning.   levothyroxine  (SYNTHROID ) 88 MCG tablet Take 1 tablet (88 mcg total) by mouth daily.   magnesium  oxide (MAG-OX) 400 (240 Mg) MG tablet Take 400 mg by mouth in the morning.   metFORMIN  (GLUCOPHAGE ) 500 MG tablet Take 1 tablet (500 mg total) by mouth 2 (two) times daily with a meal.   montelukast  (SINGULAIR ) 10 MG tablet TAKE 1 TABLET BY MOUTH AT BEDTIME   nystatin  (MYCOSTATIN ) 100000 UNIT/ML suspension Use as directed 5 mLs in the mouth or throat 4 (four) times daily as needed (thrush/irritation (due to inhaler use)).   omeprazole  (PRILOSEC ) 20 MG capsule TAKE 1 TABLET BY MOUTH ONCE DAILY   Polyethyl Glycol-Propyl Glycol (SYSTANE) 0.4-0.3 % SOLN Place 1-2 drops into both eyes 3 (three) times daily as needed (dry/irritated eyes.).   Probiotic Product (PROBIOTIC PO) Take 1 capsule by mouth in the morning.   rosuvastatin  (CRESTOR ) 5 MG tablet Take 1 tablet (5 mg total) by mouth daily.   Spacer/Aero-Holding Chambers (AEROCHAMBER MV) inhaler Use as instructed   triamcinolone  ointment (KENALOG ) 0.1 % Apply 1 Application topically 2 (two) times daily.   VITAMIN D , CHOLECALCIFEROL, PO Take 10,000 Units by mouth daily.   benzonatate  (TESSALON ) 100 MG capsule Take 1 capsule (100 mg total) by mouth 3 (three) times daily as needed.   clobetasol  ointment (TEMOVATE ) 0.05 % Apply 1 Application topically 2 (two) times daily. (Patient not taking: Reported on 04/18/2024)   cyclobenzaprine  (FLEXERIL ) 10 MG tablet Take 1 tablet (10 mg total) by mouth 3 (three) times daily as needed for muscle spasms. (Patient not taking: Reported on 04/18/2024)   metoprolol  tartrate (LOPRESSOR ) 25 MG tablet Take 0.5 tablets (12.5 mg total) by mouth 2 (two) times daily. (Patient taking differently: Take 12.5 mg by mouth 2 (two) times daily as needed (fast heart rate).)   oxyCODONE  (OXY IR/ROXICODONE ) 5 MG immediate release tablet Take 1 tablet (5 mg total) by  mouth every 6 (six) hours as needed (pain.). (Patient not taking: Reported on 04/18/2024)   Facility-Administered Encounter Medications as of 04/18/2024  Medication   benralizumab  (FASENRA ) prefilled syringe 30 mg  :   Review of Systems:  Out of a complete 14 point review of systems, all are reviewed and negative with the exception of these symptoms as listed below:  Review of Systems  Neurological:        Pt here for tremors Pt states tremors in both hands and face     Objective:  Neurological Exam  Physical Exam Physical Examination:   Vitals:   04/18/24 1511  BP: (!) 147/84  Pulse: 89    General Examination: The patient is a very pleasant 68 y.o. female in no acute distress. She appears deconditioned.  Well-groomed.   HEENT: Normocephalic, atraumatic, pupils are equal, round and reactive to light, extraocular tracking is good without limitation to gaze excursion or nystagmus noted. Hearing is grossly intact. Face is symmetric with mild facial masking, mild nuchal rigidity noted, intermittent lower jaw and lip tremor noted. Speech is clear with no dysarthria noted.  Perhaps mild hypophonia. There are no carotid bruits on auscultation. Oropharynx exam reveals: moderate mouth dryness, adequate dental hygiene and moderate airway crowding.  Tongue protrudes centrally and palate elevates symmetrically.  Chest: Clear to auscultation without  wheezing, rhonchi or crackles noted.  Heart: S1+S2+0, regular and normal without murmurs, rubs or gallops noted.   Abdomen: Soft, non-tender and non-distended.  Extremities: There is significant swelling in keeping with lymphedema in the left upper extremity and significant swelling in both lower extremities.   Skin: Warm and dry without trophic changes noted.   Musculoskeletal: exam reveals limited range of motion left elbow, limited range of motion left foot and legs.    Neurologically:  Mental status: The patient is awake, alert and  oriented in all 4 spheres. Her immediate and remote memory, attention, language skills and fund of knowledge are appropriate. There is no evidence of aphasia, agnosia, apraxia or anomia. Speech is clear with normal prosody and enunciation. Thought process is linear. Mood is normal and affect is normal.  Cranial nerves II - XII are as described above under HEENT exam.  Motor exam: Normal bulk, global strength of at least 4+ out of 5, no obvious weakness noted.  No decrease or increase in tone.  She has an intermittent resting tremor in the left upper extremity, no significant resting tremor in the left lower or right sided extremities.  She has a mild postural tremor in the left and minimal postural tremor in the right upper extremities.   No significant intention tremor.   On 04/18/2024: on Archimedes spiral drawing she has trembling with the left hand, no significant trembling with the right hand, mild insecurity noted.  Handwriting with the right hand is tremulous, somewhat on the smaller side and legible.   Fine motor skills and coordination: She has difficulty with fine motor skills with finger taps and hand movements, left worse than right.  Foot taps are mild to moderately impaired bilaterally, left worse than right.  Cerebellar testing: No dysmetria or intention tremor. There is no truncal or gait ataxia.  Sensory exam: intact to light touch in the upper and lower extremities.  Gait, station and balance: She stands with difficulty and has to push herself up.  She requires no assistance.  She walks without a walker but walks rather slowly, posture is stooped but she has more lumbar kyphosis than a stooped posture in the upper back.  She has more pronounced tremor on the left while walking.  She walks with very small steps and slowly.  She turns with difficulty, no walking aid here today.  Assessment and Plan:  In summary, ZANNA HAWN is a very pleasant 68 y.o.-year old female with an underlying  complex and complicated medical history of asthma, hypothyroidism, reflux disease, diabetes, hyperlipidemia, history of breast cancer with status postsurgery and chemoradiation, anxiety, depression, history of hepatitis C in the context of blood transfusion, with a history of TTP, chronic liver disease, chronic back pain, rheumatoid arthritis, status post multiple surgeries including breast lumpectomy, cataract extractions, left foot surgery, polypectomy, bilateral total hip arthroplasties, chronic edema, chronic pain syndrome, and severe obesity with a BMI of over 40, who presents for evaluation of her tremor disorder of approximately 2 years duration.  History and examination are not telltale for essential tremor.  She has some degree of parkinsonism type changes especially with left-sided predominance noted and a resting tremor on the left side but her picture is complicated by severe swelling of the left upper extremity and both lower extremities, limited range of motion of the left elbow from a prior surgery and fracture to the left arm.  I had a long discussion with the patient and her husband today discussing the  complexity of her medical history and multiple medications, tremor disorders and symptomatic treatment options that may be limited given her medical history.  I would not recommend trial of a new medication at this time, but I would like to proceed with further evaluation with testing.  We can consider a brain MRI down the road but for now I would like to address her sleep disturbance and risk for sleep apnea with a sleep study as well as her parkinsonism with a DaTscan.  I explained the sleep test procedure to the patient and the DaTscan procedure to the patient and her husband at length today.  This was a lengthy visit of over 60 minutes with high complexity given copious record review involved, considerable counseling and coordination of care, addressing multiple issues.  We talked about  alleviating factors and exacerbating factors for tremors in general.  She is advised to decrease her caffeine intake and stay well-hydrated.  She may benefit from using a walker consistently. She is not sure if she would consider a CPAP machine but is willing to consider testing.  She is willing to proceed with authorization for testing and we will plan to follow-up after her DaTscan and her sleep study.  Thank you very much for allowing me to participate in the care of this nice patient. If I can be of any further assistance to you please do not hesitate to call me at [phone].  Sincerely,   True Mar, MD, PhD

## 2024-04-19 ENCOUNTER — Telehealth: Payer: Self-pay | Admitting: Neurology

## 2024-04-19 NOTE — Telephone Encounter (Signed)
 Tara [name] has tried to reach her and said the first number doesn't have a voice mail set up and the second one said unavailable.

## 2024-04-21 ENCOUNTER — Telehealth: Payer: Self-pay | Admitting: Neurology

## 2024-04-21 NOTE — Telephone Encounter (Signed)
 [employer] Medicare/aarp no auth req   Sen mychart.

## 2024-04-25 ENCOUNTER — Encounter: Payer: Self-pay | Admitting: Neurology

## 2024-04-27 ENCOUNTER — Other Ambulatory Visit: Payer: Self-pay

## 2024-04-27 MED ORDER — METFORMIN HCL 500 MG PO TABS: 500.0000 mg | ORAL_TABLET | Freq: Two times a day (BID) | ORAL | 2 refills | Status: DC

## 2024-05-08 ENCOUNTER — Other Ambulatory Visit: Payer: Self-pay

## 2024-05-08 ENCOUNTER — Ambulatory Visit: Admitting: Internal Medicine

## 2024-05-08 MED ORDER — METFORMIN HCL 500 MG PO TABS: 500.0000 mg | ORAL_TABLET | Freq: Two times a day (BID) | ORAL | 2 refills | Status: AC

## 2024-05-12 ENCOUNTER — Ambulatory Visit (HOSPITAL_COMMUNITY)
Admission: RE | Admit: 2024-05-12 | Discharge: 2024-05-12 | Disposition: A | Discharge status: Home / Self Care | Source: Ambulatory Visit | Attending: Neurology | Admitting: Neurology

## 2024-05-12 ENCOUNTER — Encounter (HOSPITAL_COMMUNITY): Payer: Self-pay

## 2024-05-12 DIAGNOSIS — M7989 Other specified soft tissue disorders: Secondary | ICD-10-CM | POA: Insufficient documentation

## 2024-05-12 DIAGNOSIS — R351 Nocturia: Secondary | ICD-10-CM | POA: Diagnosis present

## 2024-05-12 DIAGNOSIS — R251 Tremor, unspecified: Secondary | ICD-10-CM | POA: Diagnosis present

## 2024-05-12 DIAGNOSIS — Z9189 Other specified personal risk factors, not elsewhere classified: Secondary | ICD-10-CM | POA: Diagnosis present

## 2024-05-12 DIAGNOSIS — G20C Parkinsonism, unspecified: Secondary | ICD-10-CM | POA: Insufficient documentation

## 2024-05-12 DIAGNOSIS — R0683 Snoring: Secondary | ICD-10-CM | POA: Insufficient documentation

## 2024-05-12 DIAGNOSIS — I890 Lymphedema, not elsewhere classified: Secondary | ICD-10-CM | POA: Diagnosis present

## 2024-05-12 MED ORDER — IOFLUPANE I 123 185 MBQ/2.5ML IV SOLN
4.5000 | Freq: Once | INTRAVENOUS | Status: AC
  Administered 2024-05-12: 4.5 via INTRAVENOUS
  Filled 2024-05-12: qty 5

## 2024-05-12 MED ORDER — POTASSIUM IODIDE (ANTIDOTE) 130 MG PO TABS
ORAL_TABLET | ORAL | Status: AC
  Filled 2024-05-12: qty 1

## 2024-05-15 ENCOUNTER — Ambulatory Visit: Payer: Self-pay | Admitting: *Deleted

## 2024-05-16 ENCOUNTER — Encounter: Payer: Self-pay | Admitting: Internal Medicine

## 2024-05-16 ENCOUNTER — Ambulatory Visit (INDEPENDENT_AMBULATORY_CARE_PROVIDER_SITE_OTHER): Admitting: Internal Medicine

## 2024-05-16 VITALS — BP 112/78 | HR 98 | Resp 20 | Ht 66.0 in | Wt 267.0 lb

## 2024-05-16 DIAGNOSIS — J4550 Severe persistent asthma, uncomplicated: Secondary | ICD-10-CM

## 2024-05-16 DIAGNOSIS — H1045 Other chronic allergic conjunctivitis: Secondary | ICD-10-CM

## 2024-05-16 DIAGNOSIS — L2084 Intrinsic (allergic) eczema: Secondary | ICD-10-CM

## 2024-05-16 DIAGNOSIS — J3089 Other allergic rhinitis: Secondary | ICD-10-CM | POA: Diagnosis not present

## 2024-05-16 DIAGNOSIS — K219 Gastro-esophageal reflux disease without esophagitis: Secondary | ICD-10-CM

## 2024-05-16 DIAGNOSIS — J302 Other seasonal allergic rhinitis: Secondary | ICD-10-CM

## 2024-05-16 DIAGNOSIS — R251 Tremor, unspecified: Secondary | ICD-10-CM

## 2024-05-16 MED ORDER — FLONASE SENSIMIST 27.5 MCG/SPRAY NA SUSP: 2.0000 | Freq: Every day | NASAL | 12 refills | Status: AC

## 2024-05-16 MED ORDER — FEXOFENADINE HCL 180 MG PO TABS: 180.0000 mg | ORAL_TABLET | Freq: Every day | ORAL | 1 refills | Status: AC

## 2024-05-16 MED ORDER — AZELASTINE HCL 0.1 % NA SOLN: 1.0000 | Freq: Two times a day (BID) | NASAL | 5 refills | Status: AC | PRN

## 2024-05-16 NOTE — Patient Instructions (Addendum)
 Moderate persistent asthma:  well controlled   PLAN:  -  Biologic:  Continue fasenra  injections  - Daily controller medication(s): Singulair  10mg  daily and Advair  45mcg 1 puff every other day as needed  - Prior to physical activity: albuterol  2 puffs 10-15 minutes before physical activity. - Rescue medications: albuterol  4 puffs every 4-6 hours as needed - Get Influenza Vaccine and appropriate Pneumonia and COVID 19 boosters  - Asthma control goals:  * Full participation in all desired activities (may need albuterol  before activity) * Albuterol  use two time or less a week on average (not counting use with activity) * Cough interfering with sleep two time or less a month * Oral steroids no more than once a year * No hospitalizations   Allergic Rhinitis: not well controlled  Stop benadryl   Start Flonase  1 spray per nostril twice daily Increase Astelin  to 1 spray per nostril twice daily Consider adding fexofenadine  180 mg daily. Continue Singulair  10 mg daily Sinus rinses twice daily, used 10 to 50 minutes before nasal sprays Pataday eye drops 1 drop per eye twice a day as needed    Atopic Dermatitis with stasis dermatitis   Daily Care For Maintenance (daily and continue even once eczema controlled) - Daily moisturizer recommended for stasis dermatitis  - Recommend avoiding detergents, soaps or lotions with fragrances/dyes, and instead using products which are hypoallergenic, use second rinse cycle when washing clothes -Wear lose breathable clothing, avoid wool -Avoid extremes of humidity - Limit showers/baths to 5 minutes and use luke warm water instead of hot, pat dry following baths, and apply moisturizer  For Flares:(add this to maintenance therapy if needed for flares) - Clobetasol  0.0.5% to body for severe flares-apply topically twice daily to red, raised, thickened areas of skin, followed by moisturizer   -Use for 5 days then step down to triamcinolone  0.1% ointment  -  Triamcinolone  0.1% to body for moderate flares-apply topically twice daily to red, raised areas of skin, followed by moisturizer - Hydrocortisone 2.5% to face, armpit or groin-apply topically twice daily to red, raised areas of skin, followed by moisturizer    Reflux - Continue lifestyle and dietary modifications - Continue famotidine  twice a day - Continue to follow with gastroenterology   Let me know if we need to make medical changes after your discussion with your neurologist.  I am not concerned about the above regimen and treatment for parkinsons    Follow up: 6 months   Thank you so much for letting me partake in your care today.  Don't hesitate to reach out if you have any additional concerns!  Hargis Springer, MD  [facility] [place], [place]

## 2024-05-16 NOTE — Progress Notes (Addendum)
 Follow Up Note  RE: Tara [name] MRN: [id number] DOB: 07/07/1956 Date of Office Visit: 05/16/2024  Referring provider: Perri Ronal PARAS, MD Primary care provider: Perri Ronal PARAS, MD  Chief Complaint: Follow-up (When she wakes up, right side is congested but otherwise doing well )  History of Present Illness: I had the pleasure of seeing Tara [name] for a follow up visit at the [facility] on 05/16/2024. She is a 68 y.o. female, who is being followed for persistent asthma, atopic dermatitis, allergic rhinitis, reflux, oral candidiasis. Her previous allergy office visit was on 11/03/23 with Dr. Lorin. Today is a regular follow up visit.  History obtained from patient, chart review and  .  Discussed the use of AI scribe software for clinical note transcription with the patient, who gave verbal consent to proceed.  History of Present Illness Tara [name] is a 68 year old female with asthma and eczema who presents with nasal congestion and concerns about medication side effects.  Nasal congestion - Chronic nasal congestion, primarily affecting the right nostril - Uses Astelin  nasal spray as needed - Uses Neomed saline spray to clear nasal passages - Not currently using Flonase ; inquires about concurrent use of Astelin  and Flonase   Asthma and cough - History of asthma - No current breathing difficulties - No OCS, ER, urgent care, antibiotics since last visit.  Reports compliance with Singulair , Advair  1 puff every other day.  Has a history of thrush.  Fasenra  injections going well.   Eczema and biologic therapy - History of eczema - Previously treated with Dupixent , which resulted in joint pains and no improvement in eczema - Transitioned to Fasenra  for ongoing management - eczema stable on daily emollients and triamcinolone    Medication side effects and neurological symptoms - Concerned about tremors and possible Parkinson's disease; under evaluation by  neurology, imaging consistent with possible parkinsons and has neuro appointment tomorrow  - Questions whether current medications could contribute to tremors - Previously discontinued gabapentin  and Flexeril  after back surgery, which resolved back pain - Takes Benadryl  at night for allergies; considering switching to Allegra  due to potential neurological side effects  Follows with GI for GERD.  On famotidine  twice daily.   Assessment and Plan: Tara [name] is a 68 y.o. female with: Seasonal and perennial allergic rhinitis  Severe persistent asthma without complication - Plan: Spirometry with Graph  Intrinsic atopic dermatitis  Gastroesophageal reflux disease without esophagitis  Other chronic allergic conjunctivitis of both eyes  Tremor Plan: Patient Instructions  Moderate persistent asthma:  well controlled  Will update breathing tests at next visit.    PLAN:  -  Biologic:  Continue fasenra  injections  - Daily controller medication(s): Singulair  10mg  daily and Advair  45mcg 1 puff every other day as needed  - Prior to physical activity: albuterol  2 puffs 10-15 minutes before physical activity. - Rescue medications: albuterol  4 puffs every 4-6 hours as needed - Get Influenza Vaccine and appropriate Pneumonia and COVID 19 boosters  - Asthma control goals:  * Full participation in all desired activities (may need albuterol  before activity) * Albuterol  use two time or less a week on average (not counting use with activity) * Cough interfering with sleep two time or less a month * Oral steroids no more than once a year * No hospitalizations   Allergic Rhinitis: not well controlled  Stop benadryl   Start Flonase  1 spray per nostril twice daily Increase Astelin  to 1 spray per nostril twice daily  Consider adding fexofenadine  180 mg daily. Continue Singulair  10 mg daily Sinus rinses twice daily, used 10 to 50 minutes before nasal sprays Pataday eye drops 1 drop per eye twice a day as  needed    Atopic Dermatitis with stasis dermatitis   Daily Care For Maintenance (daily and continue even once eczema controlled) - Daily moisturizer recommended for stasis dermatitis  - Recommend avoiding detergents, soaps or lotions with fragrances/dyes, and instead using products which are hypoallergenic, use second rinse cycle when washing clothes -Wear lose breathable clothing, avoid wool -Avoid extremes of humidity - Limit showers/baths to 5 minutes and use luke warm water instead of hot, pat dry following baths, and apply moisturizer  For Flares:(add this to maintenance therapy if needed for flares) - Clobetasol  0.0.5% to body for severe flares-apply topically twice daily to red, raised, thickened areas of skin, followed by moisturizer   -Use for 5 days then step down to triamcinolone  0.1% ointment  - Triamcinolone  0.1% to body for moderate flares-apply topically twice daily to red, raised areas of skin, followed by moisturizer - Hydrocortisone 2.5% to face, armpit or groin-apply topically twice daily to red, raised areas of skin, followed by moisturizer    Reflux - Continue lifestyle and dietary modifications - Continue famotidine  twice a day - Continue to follow with gastroenterology   Let me know if we need to make medical changes after your discussion with your neurologist.  I am not concerned about the above regimen and treatment for parkinsons    Follow up: 6 months   Thank you so much for letting me partake in your care today.  Don't hesitate to reach out if you have any additional concerns!  Hargis Springer, MD  [facility] [place] [place]  No follow-ups on file.  Meds ordered this encounter  Medications   azelastine  (ASTELIN ) 0.1 % nasal spray    Sig: Place 1-2 sprays into both nostrils 2 (two) times daily as needed.    Dispense:  30 mL    Refill:  5   fluticasone  (FLONASE  SENSIMIST) 27.5 MCG/SPRAY nasal spray    Sig: Place 2 sprays into the nose  daily.    Dispense:  10 g    Refill:  12   fexofenadine  (ALLEGRA ) 180 MG tablet    Sig: Take 1 tablet (180 mg total) by mouth daily.    Dispense:  90 tablet    Refill:  1    Lab Orders  No laboratory test(s) ordered today   Diagnostics: Spirometry:  Tracings reviewed. Her effort: Good reproducible efforts. FVC: 2.78L FEV1: 2.36L, 99% predicted FEV1/FVC ratio: 85% Interpretation: Spirometry consistent with normal pattern.  Please see scanned spirometry results for details.    Results interpreted by myself during this encounter and discussed with patient/family.   Medication List:  Current Outpatient Medications  Medication Sig Dispense Refill   acetaminophen  (TYLENOL ) 500 MG tablet Take 500 mg by mouth every 6 (six) hours as needed for mild pain, moderate pain or headache.      ADVAIR  HFA 45-21 MCG/ACT inhaler Inhale 2 puffs into the lungs 2 (two) times daily. 1 each 5   albuterol  (VENTOLIN  HFA) 108 (90 Base) MCG/ACT inhaler Two puffs every 4 hours if needed for wheezing or coughing .  May use 2 puffs 5-15 minutes prior to exercise. 18 g 1   benzonatate  (TESSALON ) 100 MG capsule Take 1 capsule (100 mg total) by mouth 3 (three) times daily as needed. 30 capsule 0  famotidine  (PEPCID ) 20 MG tablet TAKE 1 TABLET BY MOUTH DAILY 2 TIMES A DAY. MUST KEEP OFFICE VISIT FOR FURTHER REFILLS 180 tablet 1   fexofenadine  (ALLEGRA ) 180 MG tablet Take 1 tablet (180 mg total) by mouth daily. 90 tablet 1   fluticasone  (FLONASE  SENSIMIST) 27.5 MCG/SPRAY nasal spray Place 2 sprays into the nose daily. 10 g 12   Glucosamine-Chondroitin (MOVE FREE PO) Take 1 tablet by mouth in the morning.     levothyroxine  (SYNTHROID ) 88 MCG tablet Take 1 tablet (88 mcg total) by mouth daily. 90 tablet 1   magnesium  oxide (MAG-OX) 400 (240 Mg) MG tablet Take 400 mg by mouth in the morning.     metFORMIN  (GLUCOPHAGE ) 500 MG tablet Take 1 tablet (500 mg total) by mouth 2 (two) times daily with a meal. 180 tablet 2    montelukast  (SINGULAIR ) 10 MG tablet TAKE 1 TABLET BY MOUTH AT BEDTIME 90 tablet 2   nystatin  (MYCOSTATIN ) 100000 UNIT/ML suspension Use as directed 5 mLs in the mouth or throat 4 (four) times daily as needed (thrush/irritation (due to inhaler use)).     omeprazole  (PRILOSEC ) 20 MG capsule TAKE 1 TABLET BY MOUTH ONCE DAILY 90 capsule 1   Polyethyl Glycol-Propyl Glycol (SYSTANE) 0.4-0.3 % SOLN Place 1-2 drops into both eyes 3 (three) times daily as needed (dry/irritated eyes.).     Probiotic Product (PROBIOTIC PO) Take 1 capsule by mouth in the morning.     rosuvastatin  (CRESTOR ) 5 MG tablet Take 1 tablet (5 mg total) by mouth daily. 90 tablet 3   Spacer/Aero-Holding Chambers (AEROCHAMBER MV) inhaler Use as instructed 1 each 2   triamcinolone  ointment (KENALOG ) 0.1 % Apply 1 Application topically 2 (two) times daily. 30 g 0   VITAMIN D , CHOLECALCIFEROL, PO Take 10,000 Units by mouth daily.     azelastine  (ASTELIN ) 0.1 % nasal spray Place 1-2 sprays into both nostrils 2 (two) times daily as needed. 30 mL 5   clobetasol  ointment (TEMOVATE ) 0.05 % Apply 1 Application topically 2 (two) times daily. (Patient not taking: Reported on 05/16/2024) 30 g 0   metoprolol  tartrate (LOPRESSOR ) 25 MG tablet Take 0.5 tablets (12.5 mg total) by mouth 2 (two) times daily. (Patient taking differently: Take 12.5 mg by mouth 2 (two) times daily as needed (fast heart rate).) 90 tablet 3   Current Facility-Administered Medications  Medication Dose Route Frequency Provider Last Rate Last Admin   benralizumab  (FASENRA ) prefilled syringe 30 mg  30 mg Subcutaneous Q28 days Lorin Norris, MD   30 mg at 04/04/24 1108   Allergies: Allergies  Allergen Reactions   Aspirin Other (See Comments)    Pt has a hx of TTP.    Nsaids Other (See Comments)    Pt has a hx of TTP.    I reviewed her past medical history, social history, family history, and environmental history and no significant changes have been reported from her  previous visit.  ROS: All others negative except as noted per HPI.   Objective: BP 112/78 (BP Location: Right Arm, Patient Position: Sitting)   Pulse 98   Resp 20   Ht 5' 6 (1.676 m)   Wt 267 lb (121.1 kg)   SpO2 95%   BMI 43.09 kg/m  Body mass index is 43.09 kg/m. General Appearance:  Alert, cooperative, appears stated age  Head:  Normocephalic, without obvious abnormality, atraumatic  Eyes:  Conjunctiva clear, EOM's intact  Nose: Nares normal, normal mucosa, no visible anterior polyps, and septum  midline  Throat: Lips, tongue normal; teeth and gums normal, erythematous posterior oropharynx  Neck: Supple, symmetrical  Lungs:   clear to auscultation bilaterally, Respirations unlabored, no coughing  Heart:  regular rate and rhythm and no murmur, Appears well perfused  Extremities: No edema, resting tremor in left hand  Skin: Skin color, texture, turgor normal, xerosis of bilateral lower legs   Neurologic: No gross deficits   Previous notes and tests were reviewed. The plan was reviewed with the patient/family, and all questions/concerned were addressed.  It was my pleasure to see Sanii today and participate in her care. Please feel free to contact me with any questions or concerns.  Sincerely,  Hargis Springer, MD  Allergy & Immunology  [facility]  [place] Office: [phone]

## 2024-05-17 ENCOUNTER — Ambulatory Visit (INDEPENDENT_AMBULATORY_CARE_PROVIDER_SITE_OTHER): Admitting: Neurology

## 2024-05-17 ENCOUNTER — Encounter: Payer: Self-pay | Admitting: Neurology

## 2024-05-17 VITALS — BP 120/76 | HR 89 | Ht 66.5 in | Wt 270.1 lb

## 2024-05-17 DIAGNOSIS — Z9189 Other specified personal risk factors, not elsewhere classified: Secondary | ICD-10-CM | POA: Diagnosis not present

## 2024-05-17 DIAGNOSIS — I890 Lymphedema, not elsewhere classified: Secondary | ICD-10-CM

## 2024-05-17 DIAGNOSIS — M7989 Other specified soft tissue disorders: Secondary | ICD-10-CM | POA: Diagnosis not present

## 2024-05-17 DIAGNOSIS — G20C Parkinsonism, unspecified: Secondary | ICD-10-CM

## 2024-05-17 MED ORDER — CARBIDOPA-LEVODOPA 25-100 MG PO TABS: 1.0000 | ORAL_TABLET | Freq: Three times a day (TID) | ORAL | 3 refills | Status: DC

## 2024-05-17 NOTE — Patient Instructions (Signed)
 As discussed, we will start you on Sinemet  (generic name: carbidopa -levodopa ) 25/100 mg: Take half a pill 3 times a day (9 AM, 1 PM and 7 PM) for 2 weeks, then one pill 3 times a day thereafter. Please try to take the medication away from you mealtimes, that is, ideally either one hour before or 2 hours after your meal to ensure optimal absorption. The medication can interfere with the protein content of your meal and trying to the protein in your food and therefore not get fully absorbed.  Common side effects reported are: Nausea, vomiting, sedation, confusion, lightheadedness. Rare side effects include hallucinations, severe nausea or vomiting, diarrhea and significant drop in blood pressure especially when going from lying to standing or from sitting to standing.  Please reconsider pursuing the sleep study, as you may be at risk for sleep apnea.

## 2024-05-17 NOTE — Progress Notes (Signed)
 Subjective:    Patient ID: Tara [name] is a 68 y.o. female.  HPI    Interim history:   Tara [name] is a 68 year old female with an underlying complex and complicated medical history of asthma, hypothyroidism, reflux disease, diabetes, hyperlipidemia, history of breast cancer with status postsurgery and chemoradiation, anxiety, depression, history of hepatitis C in the context of blood transfusion, with a history of TTP, chronic liver disease, chronic back pain, rheumatoid arthritis, status post multiple surgeries including breast lumpectomy, cataract extractions, left foot surgery, polypectomy, bilateral total hip arthroplasties, chronic edema, chronic pain syndrome, and severe obesity with a BMI of over 40, who presents for follow-up consultation of her tremor disorder, after interim testing with a DaTscan .  The patient is accompanied by her husband today.  I first met her at the request of her primary care physician on 04/18/2024, at which time she reported an approximately 2-year history of tremors affecting her hands, primarily her left hand.  She had a brain DaTscan  on 05/12/2024 and I have reviewed the test results:   IMPRESSION: Bilateral decreased radiotracer activity within LEFT and RIGHT striatum. Findings suggest Parkinsonian syndrome pathology.   Of note, DaTSCAN  is not diagnostic of Parkinsonian syndromes, which remains a clinical diagnosis. DaTscan  is an adjuvant test to aid in the clinical diagnosis of Parkinsonian syndromes. In addition, I personally and independently reviewed images through the PACS system.  She was notified of the test results by phone call.  Today, 05/17/2024: She reports feeling about the same.  She is agreeable to starting medication for parkinsonism.  She is planning to stay active and working on weight loss.  She currently does not wish to proceed with a sleep study and requested her study appointment to be canceled. The patient's allergies, current  medications, family history, past medical history, past social history, past surgical history and problem list were reviewed and updated as appropriate.   Previously:  04/18/2024: (She) reports an approximately 2 year history of tremor affecting primarily her left upper extremity, less so on the right.  She reports a change in her handwriting particularly trembling but not necessarily a smaller handwriting.  She has no family history of tremors or Parkinson's disease.  She does drink quite a bit of caffeine in the form of coffee, about 32 ounces per day and diet soda occasionally, maybe up to 3 times a day and she tries to hydrate well with water.  She has a history of paroxysmal A-fib.  She had a sleep study several years ago but reports no history of sleep apnea.  She snores per husband.  She has sleep disruption, she has nocturia at least 2 or 3 times per average night.  She sleeps on her stomach.  She does not drink any alcohol . I reviewed your office note from 02/13/2024.  She is on a beta-blocker, namely metoprolol .  He is on multiple medications, she sees Dr. Letha for her back pain and prior to that had seen Dr. Leopoldo with neurology.  She was on narcotic pain medication with oxycodone  as needed, but is no longer on it, after her back surgery in October 2024.  She does take Flexeril  3 times a day as needed and gabapentin  as needed. She had blood work on 01/31/2024 including TSH which was normal at 3.26.    She has gained weight after her back surgery since she has not been as active.  She has had a change in her posture.  She walks very slowly.  She has a walker but does not use it all the time.    Her Past Medical History Is Significant For: Past Medical History:  Diagnosis Date   Allergy    Anxiety    Arthritis    Back pain    Chronic edema    Chronic hepatitis C without hepatic coma (HCC) 10/29/2012   Chronic pain syndrome 07/01/2018   Cirrhosis (HCC) 05/10/2017   Class 2 severe obesity  with serious comorbidity and body mass index (BMI) of 37.0 to 37.9 in adult (HCC) 10/25/2018   Cold hands and feet    Complication of anesthesia    difficulty waking up/dizzy/lightheaded   Controlled type 2 diabetes mellitus without complication, without long-term current use of insulin  (HCC) 07/11/2018   Cough    Dry eye syndrome of both eyes 09/02/2022   Dysrhythmia, cardiac 09/28/2011   She has no records with her today. She tells me that a few years ago in [place] she had palpitations and a cardiology placed her on digoxin , she does not know what type of dysrhythmia. She has not had any palpitations recently     Environmental allergies    Eye pain    Fatigue    Gastroesophageal reflux disease 11/05/2015   H/O varicella    H/O varicose veins    Hepatic cirrhosis (HCC)    Hepatitis C    History of breast cancer 01/15/2020   History of cardiac arrhythmia 01/15/2020   History of chemotherapy 2013   left breast cancer   History of hepatitis C 01/15/2020   History of left hip replacement 08/29/2018   History of measles, mumps, or rubella    History of osteoporosis 01/15/2020   History of total right hip replacement 08/29/2018   History of TTP (thrombotic thrombocytopenic purpura) 10/29/2012   Hx of radiation therapy 12/15/11 - 01/29/12   left breast   Hyperlipidemia    Hypokalemia 12/29/2013   Hypothyroidism    Intrinsic atopic dermatitis 01/15/2020   Irregular heart beat    under control   Joint pain    Leg cramps    Lymphedema of arm 12/15/2011   Malignant neoplasm of axillary tail of left breast in female, estrogen receptor positive (HCC) 08/12/2015   Malignant neoplasm of upper-outer quadrant of left breast in female, estrogen receptor positive (HCC) 07/17/2014   Moderate persistent asthma without complication 01/15/2020   Neuromuscular disorder (HCC)    left foot nerve damage- neuropathy    Oral candidiasis 09/02/2022   Osteopenia    Other allergic rhinitis 10/29/2012    Palpitations    Prediabetes 10/25/2018   Radiculopathy 07/2020   Rheumatoid arthritis (HCC)    Ringing in ear    Rosacea, acne 09/28/2011   Severe persistent asthma without complication 09/02/2022   Status post bilateral hip replacements 10/29/2012   Swallowing difficulty    Swelling of both lower extremities    Thrombocytopenia, primary (HCC)    TTP (thrombotic thrombocytopenic purpura) (HCC)    1982   Vitamin D  deficiency 10/25/2018    Her Past Surgical History Is Significant For: Past Surgical History:  Procedure Laterality Date   BREAST LUMPECTOMY  08/03/11   left lumpectomy and slnbx,T1cN0,triple pos   CATARACT EXTRACTION, BILATERAL     COLONOSCOPY  01/26/2019   COLONOSCOPY  08/12/2020   elbow pins Left    FOOT SURGERY Left    multiple   POLYPECTOMY     PORTACATH PLACEMENT  08/03/2011   Procedure: INSERTION PORT-A-CATH;  Surgeon: Deward RAMAN  Curvin MOULD, MD;  Location: [facility] OR;  Service: General;  Laterality: Right;   portacath removal     THORACIC DISCECTOMY Left 06/21/2023   Procedure: Laminectomy and Foraminotomy - left - Thoracic eleven-Thoracic twelve;  Surgeon: Louis Shove, MD;  Location: [facility] OR;  Service: Neurosurgery;  Laterality: Left;   TOTAL HIP ARTHROPLASTY Bilateral    UPPER GASTROINTESTINAL ENDOSCOPY     UPPER GASTROINTESTINAL ENDOSCOPY  08/12/2020    Her Family History Is Significant For: Family History  Problem Relation Age of Onset   Pneumonia Mother    COPD Mother    Colon polyps Mother    Heart disease Mother    Thyroid  disease Mother    Obesity Mother    COPD Father    Obesity Father    Brain cancer Maternal Grandfather    Alcohol  abuse Maternal Grandfather    Hyperlipidemia Neg Hx    Hypertension Neg Hx    Colon cancer Neg Hx    Rectal cancer Neg Hx    Stomach cancer Neg Hx    Esophageal cancer Neg Hx    Parkinson's disease Neg Hx     Her Social History Is Significant For: Social History   Socioeconomic History   Marital status: Married     Spouse name: Tim   Number of children: 0   Years of education: Not on file   Highest education level: Bachelor's degree (e.g., BA, AB, BS)  Occupational History   Occupation: Retired  Tobacco Use   Smoking status: Never   Smokeless tobacco: Never  Vaping Use   Vaping status: Never Used  Substance and Sexual Activity   Alcohol  use: Yes    Alcohol /week: 1.0 standard drink of alcohol     Types: 1 Glasses of wine per week    Comment: occ   Drug use: No   Sexual activity: Not on file  Other Topics Concern   Not on file  Social History Narrative   Pt lives with husband    Retired    Caffeine 24oz cup a day    Social Drivers of Corporate investment banker Strain: Low Risk  (02/08/2024)   Overall Financial Resource Strain (CARDIA)    Difficulty of Paying Living Expenses: Not hard at all  Food Insecurity: No Food Insecurity (02/08/2024)   Hunger Vital Sign    Worried About Running Out of Food in the Last Year: Never true    Ran Out of Food in the Last Year: Never true  Transportation Needs: No Transportation Needs (02/08/2024)   PRAPARE - Administrator, Civil Service (Medical): No    Lack of Transportation (Non-Medical): No  Physical Activity: Insufficiently Active (02/08/2024)   Exercise Vital Sign    Days of Exercise per Week: 2 days    Minutes of Exercise per Session: 30 min  Stress: No Stress Concern Present (02/08/2024)   [employer] of Occupational Health - Occupational Stress Questionnaire    Feeling of Stress : Only a little  Social Connections: Moderately Isolated (02/08/2024)   Social Connection and Isolation Panel    Frequency of Communication with Friends and Family: Once a week    Frequency of Social Gatherings with Friends and Family: Once a week    Attends Religious Services: 1 to 4 times per year    Active Member of [employer] or Organizations: No    Attends Banker Meetings: Never    Marital Status: Married    Her Allergies Are:   Allergies  Allergen Reactions   Aspirin Other (See Comments)    Pt has a hx of TTP.    Nsaids Other (See Comments)    Pt has a hx of TTP.   :   Her Current Medications Are:  Outpatient Encounter Medications as of 05/17/2024  Medication Sig   acetaminophen  (TYLENOL ) 500 MG tablet Take 500 mg by mouth every 6 (six) hours as needed for mild pain, moderate pain or headache.    ADVAIR  HFA 45-21 MCG/ACT inhaler Inhale 2 puffs into the lungs 2 (two) times daily. (Patient taking differently: Inhale 2 puffs into the lungs 2 (two) times daily. As needed)   albuterol  (VENTOLIN  HFA) 108 (90 Base) MCG/ACT inhaler Two puffs every 4 hours if needed for wheezing or coughing .  May use 2 puffs 5-15 minutes prior to exercise.   azelastine  (ASTELIN ) 0.1 % nasal spray Place 1-2 sprays into both nostrils 2 (two) times daily as needed.   famotidine  (PEPCID ) 20 MG tablet TAKE 1 TABLET BY MOUTH DAILY 2 TIMES A DAY. MUST KEEP OFFICE VISIT FOR FURTHER REFILLS   fexofenadine  (ALLEGRA ) 180 MG tablet Take 1 tablet (180 mg total) by mouth daily.   fluticasone  (FLONASE  SENSIMIST) 27.5 MCG/SPRAY nasal spray Place 2 sprays into the nose daily.   Glucosamine-Chondroitin (MOVE FREE PO) Take 1 tablet by mouth in the morning. (Patient taking differently: Take 1 tablet by mouth in the morning. As needed)   levothyroxine  (SYNTHROID ) 88 MCG tablet Take 1 tablet (88 mcg total) by mouth daily.   magnesium  oxide (MAG-OX) 400 (240 Mg) MG tablet Take 400 mg by mouth in the morning.   metFORMIN  (GLUCOPHAGE ) 500 MG tablet Take 1 tablet (500 mg total) by mouth 2 (two) times daily with a meal.   montelukast  (SINGULAIR ) 10 MG tablet TAKE 1 TABLET BY MOUTH AT BEDTIME   nystatin  (MYCOSTATIN ) 100000 UNIT/ML suspension Use as directed 5 mLs in the mouth or throat 4 (four) times daily as needed (thrush/irritation (due to inhaler use)).   omeprazole  (PRILOSEC ) 20 MG capsule TAKE 1 TABLET BY MOUTH ONCE DAILY   Polyethyl Glycol-Propyl Glycol  (SYSTANE) 0.4-0.3 % SOLN Place 1-2 drops into both eyes 3 (three) times daily as needed (dry/irritated eyes.).   rosuvastatin  (CRESTOR ) 5 MG tablet Take 1 tablet (5 mg total) by mouth daily.   Spacer/Aero-Holding Chambers (AEROCHAMBER MV) inhaler Use as instructed   triamcinolone  ointment (KENALOG ) 0.1 % Apply 1 Application topically 2 (two) times daily.   VITAMIN D , CHOLECALCIFEROL, PO Take 10,000 Units by mouth daily.   benzonatate  (TESSALON ) 100 MG capsule Take 1 capsule (100 mg total) by mouth 3 (three) times daily as needed. (Patient not taking: Reported on 05/17/2024)   clobetasol  ointment (TEMOVATE ) 0.05 % Apply 1 Application topically 2 (two) times daily. (Patient not taking: Reported on 05/16/2024)   metoprolol  tartrate (LOPRESSOR ) 25 MG tablet Take 0.5 tablets (12.5 mg total) by mouth 2 (two) times daily. (Patient not taking: Reported on 05/17/2024)   Probiotic Product (PROBIOTIC PO) Take 1 capsule by mouth in the morning. (Patient not taking: Reported on 05/17/2024)   Facility-Administered Encounter Medications as of 05/17/2024  Medication   benralizumab  (FASENRA ) prefilled syringe 30 mg  :  Review of Systems:  Out of a complete 14 point review of systems, all are reviewed and negative with the exception of these symptoms as listed below:  Review of Systems  Neurological:        Patient is here with husband to discuss DaTscan  results - patient  would like to cancel sleep study and move it to a later date not interested in cpap at this time   -appt for sleep study has been cancelled     Objective:  Neurological Exam  Physical Exam Physical Examination:   Vitals:   05/17/24 1326  BP: 120/76  Pulse: 89  SpO2: 94%    General Examination: The patient is a very pleasant 68 y.o. female in no acute distress. She appears well-developed and well-nourished and well groomed.   HEENT: Normocephalic, atraumatic, pupils are equal, round and reactive to light, extraocular tracking is  good without limitation to gaze excursion or nystagmus noted. Hearing is grossly intact. Face is symmetric with mild facial masking, mild nuchal rigidity noted, intermittent lower jaw and lip tremor noted. Speech is clear with no dysarthria noted, mild hypophonia is noted. There are no carotid bruits on auscultation. Oropharynx exam reveals: moderate mouth dryness, adequate dental hygiene and moderate airway crowding.  Tongue protrudes centrally and palate elevates symmetrically.   Chest: Clear to auscultation without wheezing, rhonchi or crackles noted.   Heart: S1+S2+0, regular and normal without murmurs, rubs or gallops noted.    Abdomen: Soft, non-tender and non-distended.   Extremities: There is significant swelling in keeping with lymphedema in the left upper extremity and significant swelling in both lower extremities.    Skin: Warm and dry without trophic changes noted.    Musculoskeletal: exam reveals limited range of motion left elbow, limited range of motion left foot and both legs.     Neurologically:  Mental status: The patient is awake, alert and oriented in all 4 spheres. Her immediate and remote memory, attention, language skills and fund of knowledge are appropriate. There is no evidence of aphasia, agnosia, apraxia or anomia. Speech is clear with normal prosody and enunciation. Thought process is linear. Mood is normal and affect is normal, mildly anxious appearing.  Cranial nerves II - XII are as described above under HEENT exam.  Motor exam: Normal bulk, global strength of at least 4+ out of 5, no obvious focal weakness noted.  Mildly increased tone in both upper extremities in the wrist.  she has an intermittent resting tremor in the left upper extremity, no significant resting tremor in the left lower or right sided extremities.  She has a mild postural tremor in the left and minimal postural tremor in the right upper extremities.   She has no significant intention tremor.    (On 04/18/2024: on Archimedes spiral drawing she has trembling with the left hand, no significant trembling with the right hand, mild insecurity noted.  Handwriting with the right hand is tremulous, somewhat on the smaller side and legible.)    Fine motor skills and coordination: She has difficulty with fine motor skills with finger taps and hand movements, left worse than right, left side moderately impaired, right side mild to moderately impaired.  Foot taps are mild to moderately impaired bilaterally, left worse than right.  Cerebellar testing: No dysmetria or intention tremor. There is no truncal or gait ataxia.  Sensory exam: intact to light touch in the upper and lower extremities.  Gait, station and balance: She stands with difficulty and has to push herself up.  She requires no assistance.  She walks without a walker but walks rather slowly, posture is stooped but she has more lumbar kyphosis and she walks cautiously.  She has decreased arm swing left more than right.  She does not pick up her feet as well but does  not have any telltale shuffling. She turns with difficulty, no walking aid here today.   Assessment and Plan:  In summary, MARIGNY BORRE is a very pleasant 68 year old female with an underlying complex and complicated medical history of asthma, hypothyroidism, reflux disease, diabetes, hyperlipidemia, history of breast cancer with status postsurgery and chemoradiation, anxiety, depression, history of hepatitis C in the context of blood transfusion, with a history of TTP, chronic liver disease, chronic back pain, rheumatoid arthritis, status post multiple surgeries including breast lumpectomy, cataract extractions, left foot surgery, polypectomy, bilateral total hip arthroplasties, chronic edema, chronic pain syndrome, and severe obesity with a BMI of over 40, who presents for follow-up consultation of her parkinsonism of at least 2 years duration.  Her recent brain DaTscan  was supportive  of an underlying parkinsonian syndrome.  She may be at risk for obstructive sleep apnea and does have a history of sleep difficulty but would like to table her sleep study at this time.  She is advised to let us  know when she is ready to proceed with sleep testing.  We mutually agreed to start her on medication for parkinsonism in the form of levodopa  therapy.  She is advised to start generic Sinemet  25-100 mg strength half a pill 3 times daily.  She is advised to take it at 9 AM, 1 PM and 7 PM daily.  She will increase it to 1 pill 3 times daily after about 2 weeks.  We talked about expectations, potential common side effects and limitation of the medication at length today.  She is advised to stay well-hydrated and well rested and continue to try to stay active and work on weight loss.   We will plan a follow-up in about 3 months, sooner if needed.  I answered all her questions today and the patient and her husband were in agreement. I spent 35 minutes in total face-to-face time and in reviewing records during pre-charting, more than 50% of which was spent in counseling and coordination of care, reviewing test results, reviewing medications and treatment regimen and/or in discussing or reviewing the diagnosis of primary parkinsonism, the prognosis and treatment options. Pertinent laboratory and imaging test results that were available during this visit with the patient were reviewed by me and considered in my medical decision making (see chart for details).

## 2024-05-18 ENCOUNTER — Encounter: Payer: Self-pay | Admitting: Internal Medicine

## 2024-05-18 ENCOUNTER — Encounter

## 2024-05-22 NOTE — Progress Notes (Signed)
 Patient Care Team: Perri Tara PARAS, MD as PCP - General (Internal Medicine) Marcey Elspeth PARAS, MD as Consulting Physician (Ophthalmology)  Visit Date: 05/23/24  Subjective:    Patient ID: Tara [name] , Female   DOB: 04-08-1956, 68 y.o.    MRN: [id number]   68 y.o. Female presents today for an office visit to discuss her recent Primary Parkinsonism diagnoses. Patient has a past medical history of Dysrhythmia, Hyperlipidemia, Impaired glucose tolerance, Hypothyroidism, asthma, GE reflux.   She was recently diagnosed with Primary Parkinsonism and her Neurologist , Dr Buck prescribed Sinemet  IR 25-100 mg 3 times daily. She wanted discuss it before she started taking. Today,  I advised to take it as directed. She also says that she can't cook anymore because she can't hold spices and measuring spoons due to the tremor and when she goes to sleep her hand and face began to tremor. She also cannot maneuver to get up and down stairs and  sidewalk curbs.    Her husband said she has problems breathing while asleep. She had sleep study scheduled but then  she canceled  it and has expressed that she does not want to be on a CPAP machine. Therefore, I see no reason to do a study when she will not accept the treatment. She can think about it.  Dysrhythmia treated with Metoprolol  tartrate 12.5 twice daily, as needed for tachycardia. Pulse today is 97. Blood pressure today is normal at  120/80     History of Hyperlipidemia treated with Rosuvastatin  5 mg daily    History of Impaired glucose tolerance treated with metformin  500 mg twice daily.  Recent Hgb AIC in June was normal at 5.6%  History of hypothyroidism treated with levothyroxine  88 mcg daily    05/12/2024 Brain Datascan Bilateral decreased radiotracer activity within LEFT and RIGHT striatum. Findings suggest Parkinsonian syndrome pathology.      She has switched from taking Benadryl  and has started to take Allegra  for her allergies.   Vaccine  counseling: Influenza Vaccine received today.    01/26/2019 Colonoscopy One 4 mm polyp at the hepatic flexure, removed using injection lift and a cold snare. Resected and retrieved. Treatment not successful. Altered vascular mucosa in the cecum of unclear clinical significant. Biopsied. Diverticulosis in the sigmoid colon and in the descending colon. The examination was otherwise normal on direct and retroflexion views. Colonoscopy due now.     Past Medical History:  Diagnosis Date   Allergy    Anxiety    Arthritis    Back pain    Chronic edema    Chronic hepatitis C without hepatic coma (HCC) 10/29/2012   Chronic pain syndrome 07/01/2018   Cirrhosis (HCC) 05/10/2017   Class 2 severe obesity with serious comorbidity and body mass index (BMI) of 37.0 to 37.9 in adult 10/25/2018   Cold hands and feet    Complication of anesthesia    difficulty waking up/dizzy/lightheaded   Controlled type 2 diabetes mellitus without complication, without long-term current use of insulin  (HCC) 07/11/2018   Cough    Dry eye syndrome of both eyes 09/02/2022   Dysrhythmia, cardiac 09/28/2011   She has no records with her today. She tells me that a few years ago in [place] she had palpitations and a cardiology placed her on digoxin , she does not know what type of dysrhythmia. She has not had any palpitations recently     Environmental allergies    Eye pain    Fatigue  Gastroesophageal reflux disease 11/05/2015   H/O varicella    H/O varicose veins    Hepatic cirrhosis (HCC)    Hepatitis C    History of breast cancer 01/15/2020   History of cardiac arrhythmia 01/15/2020   History of chemotherapy 2013   left breast cancer   History of hepatitis C 01/15/2020   History of left hip replacement 08/29/2018   History of measles, mumps, or rubella    History of osteoporosis 01/15/2020   History of total right hip replacement 08/29/2018   History of TTP (thrombotic thrombocytopenic purpura) 10/29/2012   Hx of  radiation therapy 12/15/11 - 01/29/12   left breast   Hyperlipidemia    Hypokalemia 12/29/2013   Hypothyroidism    Intrinsic atopic dermatitis 01/15/2020   Irregular heart beat    under control   Joint pain    Leg cramps    Lymphedema of arm 12/15/2011   Malignant neoplasm of axillary tail of left breast in female, estrogen receptor positive (HCC) 08/12/2015   Malignant neoplasm of upper-outer quadrant of left breast in female, estrogen receptor positive (HCC) 07/17/2014   Moderate persistent asthma without complication 01/15/2020   Neuromuscular disorder (HCC)    left foot nerve damage- neuropathy    Oral candidiasis 09/02/2022   Osteopenia    Other allergic rhinitis 10/29/2012   Palpitations    Prediabetes 10/25/2018   Radiculopathy 07/2020   Rheumatoid arthritis (HCC)    Ringing in ear    Rosacea, acne 09/28/2011   Severe persistent asthma without complication 09/02/2022   Status post bilateral hip replacements 10/29/2012   Swallowing difficulty    Swelling of both lower extremities    Thrombocytopenia, primary (HCC)    TTP (thrombotic thrombocytopenic purpura) (HCC)    1982   Vitamin D  deficiency 10/25/2018     Family History  Problem Relation Age of Onset   Pneumonia Mother    COPD Mother    Colon polyps Mother    Heart disease Mother    Thyroid  disease Mother    Obesity Mother    COPD Father    Obesity Father    Brain cancer Maternal Grandfather    Alcohol  abuse Maternal Grandfather    Hyperlipidemia Neg Hx    Hypertension Neg Hx    Colon cancer Neg Hx    Rectal cancer Neg Hx    Stomach cancer Neg Hx    Esophageal cancer Neg Hx    Parkinson's disease Neg Hx     Social History   Social History Narrative   Pt lives with husband    Retired    Caffeine 24oz cup a day       Review of Systems  All other systems reviewed and are negative.       Objective:   Vitals: BP 120/80   Pulse 97   SpO2 95%    Physical Exam Vitals and nursing note  reviewed.  Constitutional:      General: She is not in acute distress.    Appearance: Normal appearance. She is not toxic-appearing.  HENT:     Head: Normocephalic and atraumatic.  Cardiovascular:     Rate and Rhythm: Normal rate and regular rhythm. No extrasystoles are present.    Pulses: Normal pulses.     Heart sounds: Normal heart sounds. No murmur heard.    No friction rub. No gallop.  Pulmonary:     Effort: Pulmonary effort is normal. No respiratory distress.     Breath sounds: Normal  breath sounds. No wheezing or rales.  Skin:    General: Skin is warm and dry.  Neurological:     Mental Status: She is alert and oriented to person, place, and time. Mental status is at baseline.  Psychiatric:        Mood and Affect: Mood normal.        Behavior: Behavior normal.        Thought Content: Thought content normal.        Judgment: Judgment normal.       Results:   Studies obtained and personally reviewed by me:  05/12/2024 Brain Datascan Bilateral decreased radiotracer activity within LEFT and RIGHT striatum. Findings suggest Parkinsonian syndrome pathology.       01/26/2019 Colonoscopy One 4 mm polyp at the hepatic flexure, removed using injection lift and a cold snare. Resected and retrieved. Treatment not successful. Altered vascular mucosa in the cecum of unclear clinical significant. Biopsied. Diverticulosis in the sigmoid colon and in the descending colon. The examination was otherwise normal on direct and retroflexion views. Colonoscopy due now.   Labs:       Component Value Date/Time   NA 140 01/31/2024 0917   NA 137 10/10/2018 1347   NA 136 04/20/2017 1422   K 4.6 01/31/2024 0917   K 4.0 04/20/2017 1422   CL 102 01/31/2024 0917   CL 108 (H) 12/05/2012 0953   CO2 24 01/31/2024 0917   CO2 28 04/20/2017 1422   GLUCOSE 97 01/31/2024 0917   GLUCOSE 136 04/20/2017 1422   GLUCOSE 112 (H) 12/05/2012 0953   BUN 12 01/31/2024 0917   BUN 13 10/10/2018 1347   BUN  19.8 04/20/2017 1422   CREATININE 1.03 01/31/2024 0917   CREATININE 1.0 04/20/2017 1422   CALCIUM  8.7 01/31/2024 0917   CALCIUM  9.0 04/20/2017 1422   PROT 7.2 01/31/2024 0917   PROT 7.5 10/10/2018 1347   PROT 7.4 04/20/2017 1422   ALBUMIN 3.8 05/24/2023 1351   ALBUMIN 4.4 10/10/2018 1347   ALBUMIN 3.7 04/20/2017 1422   AST 14 01/31/2024 0917   AST 32 04/20/2017 1422   ALT 10 01/31/2024 0917   ALT 32 (H) 04/21/2017 1120   ALT 35 04/20/2017 1422   ALKPHOS 42 05/24/2023 1351   ALKPHOS 81 04/20/2017 1422   BILITOT 1.2 01/31/2024 0917   BILITOT 0.5 10/10/2018 1347   BILITOT 0.78 04/20/2017 1422   GFRNONAA >60 05/24/2023 1351   GFRNONAA 59 (L) 01/20/2021 0929   GFRAA 68 01/20/2021 0929     Lab Results  Component Value Date   WBC 4.2 01/31/2024   HGB 13.0 01/31/2024   HCT 39.7 01/31/2024   MCV 91.7 01/31/2024   PLT 155 01/31/2024    Lab Results  Component Value Date   CHOL 204 (H) 01/31/2024   HDL 86 01/31/2024   LDLCALC 97 01/31/2024   TRIG 118 01/31/2024   CHOLHDL 2.4 01/31/2024    Lab Results  Component Value Date   HGBA1C 5.6 01/31/2024     Lab Results  Component Value Date   TSH 3.26 01/31/2024         Assessment & Plan:    She was recently diagnosed with Primary Parkinsonism and her Neurologist, Dr. Buck, prescribed sinemet  IR 25-100 mg 3 times daily. She wanted discuss it before she started taking. She was advised to take it as directed. She also says that she can't cook anymore  because she can't hold spices and measuring spoons due to the tremor and  when she goes to sleep her hand and face began to tremor. She also cannot get up and down stairs and curbs.    Referred to Occupational therapy.   Dysrhythmia treated with metoprolol  tartrate 12.5 twice daily, as needed for tachycardia. Pulse today is 97. Blood pressure today is normal at 120/80.   She has switched from taking Benedryl and has started to take Allegra  for her allergies.   Vaccine  counseling: Influenza Vaccine received today.    Likely has sleep apnea but will not consider CPAP if she has this so I did not order sleep study  BMI 42 needs to diet, exercise and lose weight. This would help sleep apnea issues.   05/12/2024 Brain Datascan Bilateral decreased radiotracer activity within LEFT and RIGHT striatum. Findings suggest Parkinsonian syndrome pathology.       Hx of mixed hyperlipidemia treated with statin and has improved with med  Hx of glucose intolerance. Had diabetic eye exam recently but most recent Hgb AIC was 5.6%  01/26/2019 Colonoscopy One 4 mm polyp at the hepatic flexure, removed using injection lift and a cold snare. Resected and retrieved. Treatment not successful. Altered vascular mucosa in the cecum of unclear clinical significant. Biopsied. Diverticulosis in the sigmoid colon and in the descending colon. The examination was otherwise normal on direct and retroflexion views. Colonoscopy due now.    Referred to [facility]    I,Makayla C Reid,acting as a scribe for Tara JINNY Hailstone, MD.,have documented all relevant documentation on the behalf of Tara JINNY Hailstone, MD,as directed by  Tara JINNY Hailstone, MD while in the presence of Tara JINNY Hailstone, MD.   I, Tara JINNY Hailstone, MD, have reviewed all documentation for this visit. The documentation on 05/23/2024 for the exam, diagnosis, procedures, and orders are all accurate and complete.

## 2024-05-23 ENCOUNTER — Encounter: Payer: Self-pay | Admitting: Internal Medicine

## 2024-05-23 ENCOUNTER — Ambulatory Visit: Admitting: Internal Medicine

## 2024-05-23 VITALS — BP 120/80 | HR 97

## 2024-05-23 DIAGNOSIS — F419 Anxiety disorder, unspecified: Secondary | ICD-10-CM

## 2024-05-23 DIAGNOSIS — E039 Hypothyroidism, unspecified: Secondary | ICD-10-CM

## 2024-05-23 DIAGNOSIS — Z23 Encounter for immunization: Secondary | ICD-10-CM | POA: Diagnosis not present

## 2024-05-23 DIAGNOSIS — Z1211 Encounter for screening for malignant neoplasm of colon: Secondary | ICD-10-CM | POA: Diagnosis not present

## 2024-05-23 DIAGNOSIS — F32A Depression, unspecified: Secondary | ICD-10-CM

## 2024-05-23 DIAGNOSIS — E1169 Type 2 diabetes mellitus with other specified complication: Secondary | ICD-10-CM

## 2024-05-23 DIAGNOSIS — J4520 Mild intermittent asthma, uncomplicated: Secondary | ICD-10-CM

## 2024-05-23 DIAGNOSIS — Z6841 Body Mass Index (BMI) 40.0 and over, adult: Secondary | ICD-10-CM

## 2024-05-23 DIAGNOSIS — K219 Gastro-esophageal reflux disease without esophagitis: Secondary | ICD-10-CM

## 2024-05-23 DIAGNOSIS — G20A1 Parkinson's disease without dyskinesia, without mention of fluctuations: Secondary | ICD-10-CM | POA: Diagnosis not present

## 2024-05-23 DIAGNOSIS — E782 Mixed hyperlipidemia: Secondary | ICD-10-CM

## 2024-05-28 ENCOUNTER — Encounter: Payer: Self-pay | Admitting: Internal Medicine

## 2024-05-28 NOTE — Patient Instructions (Signed)
 Referral to occupational therapy at pt. Request. Pt. Should consider possibility of using CPAP for obstructive sleep apnea. Can discuss at next visit in December which is Medical wellness visit

## 2024-05-30 ENCOUNTER — Ambulatory Visit (INDEPENDENT_AMBULATORY_CARE_PROVIDER_SITE_OTHER)

## 2024-05-30 DIAGNOSIS — J4551 Severe persistent asthma with (acute) exacerbation: Secondary | ICD-10-CM

## 2024-05-30 DIAGNOSIS — J4550 Severe persistent asthma, uncomplicated: Secondary | ICD-10-CM

## 2024-06-15 ENCOUNTER — Ambulatory Visit: Admitting: Neurology

## 2024-07-13 ENCOUNTER — Ambulatory Visit: Admitting: Occupational Therapy

## 2024-07-20 ENCOUNTER — Encounter: Payer: Self-pay | Admitting: Occupational Therapy

## 2024-07-20 ENCOUNTER — Ambulatory Visit: Attending: Internal Medicine | Admitting: Occupational Therapy

## 2024-07-20 DIAGNOSIS — M25612 Stiffness of left shoulder, not elsewhere classified: Secondary | ICD-10-CM | POA: Insufficient documentation

## 2024-07-20 DIAGNOSIS — M25611 Stiffness of right shoulder, not elsewhere classified: Secondary | ICD-10-CM | POA: Insufficient documentation

## 2024-07-20 DIAGNOSIS — M25622 Stiffness of left elbow, not elsewhere classified: Secondary | ICD-10-CM | POA: Insufficient documentation

## 2024-07-20 DIAGNOSIS — G20A1 Parkinson's disease without dyskinesia, without mention of fluctuations: Secondary | ICD-10-CM | POA: Diagnosis not present

## 2024-07-20 DIAGNOSIS — R29818 Other symptoms and signs involving the nervous system: Secondary | ICD-10-CM | POA: Diagnosis present

## 2024-07-20 DIAGNOSIS — M25621 Stiffness of right elbow, not elsewhere classified: Secondary | ICD-10-CM | POA: Insufficient documentation

## 2024-07-20 DIAGNOSIS — R278 Other lack of coordination: Secondary | ICD-10-CM | POA: Diagnosis present

## 2024-07-20 NOTE — Therapy (Signed)
 OUTPATIENT OCCUPATIONAL THERAPY [facility] EVALUATION  Patient Name: Tara [name] MRN: [id number] DOB:[birth year], 68 y.o., female Today's Date: 07/21/2024  PCP: Dr. Perri REFERRING PROVIDER: Dr. Perri  END OF SESSION:  OT End of Session - 07/21/24 1007     Visit Number 1    Number of Visits 25    Date for Recertification  10/12/24    Authorization Type Medicare    Authorization - Visit Number 1    Authorization - Number of Visits 10    OT Start Time 1233    OT Stop Time 1313    OT Time Calculation (min) 40 min    Activity Tolerance Patient tolerated treatment well    Behavior During Therapy [facility] for tasks assessed/performed          Past Medical History:  Diagnosis Date   Allergy    Anxiety    Arthritis    Back pain    Chronic edema    Chronic hepatitis C without hepatic coma (HCC) 10/29/2012   Chronic pain syndrome 07/01/2018   Cirrhosis (HCC) 05/10/2017   Class 2 severe obesity with serious comorbidity and body mass index (BMI) of 37.0 to 37.9 in adult 10/25/2018   Cold hands and feet    Complication of anesthesia    difficulty waking up/dizzy/lightheaded   Controlled type 2 diabetes mellitus without complication, without long-term current use of insulin  (HCC) 07/11/2018   Cough    Dry eye syndrome of both eyes 09/02/2022   Dysrhythmia, cardiac 09/28/2011   She has no records with her today. She tells me that a few years ago in [place] she had palpitations and a cardiology placed her on digoxin , she does not know what type of dysrhythmia. She has not had any palpitations recently     Environmental allergies    Eye pain    Fatigue    Gastroesophageal reflux disease 11/05/2015   H/O varicella    H/O varicose veins    Hepatic cirrhosis (HCC)    Hepatitis C    History of breast cancer 01/15/2020   History of cardiac arrhythmia 01/15/2020   History of chemotherapy 2013   left breast cancer   History of hepatitis C 01/15/2020   History of left hip replacement  08/29/2018   History of measles, mumps, or rubella    History of osteoporosis 01/15/2020   History of total right hip replacement 08/29/2018   History of TTP (thrombotic thrombocytopenic purpura) 10/29/2012   Hx of radiation therapy 12/15/11 - 01/29/12   left breast   Hyperlipidemia    Hypokalemia 12/29/2013   Hypothyroidism    Intrinsic atopic dermatitis 01/15/2020   Irregular heart beat    under control   Joint pain    Leg cramps    Lymphedema of arm 12/15/2011   Malignant neoplasm of axillary tail of left breast in female, estrogen receptor positive (HCC) 08/12/2015   Malignant neoplasm of upper-outer quadrant of left breast in female, estrogen receptor positive (HCC) 07/17/2014   Moderate persistent asthma without complication 01/15/2020   Neuromuscular disorder (HCC)    left foot nerve damage- neuropathy    Oral candidiasis 09/02/2022   Osteopenia    Other allergic rhinitis 10/29/2012   Palpitations    Prediabetes 10/25/2018   Radiculopathy 07/2020   Rheumatoid arthritis (HCC)    Ringing in ear    Rosacea, acne 09/28/2011   Severe persistent asthma without complication (HCC) 09/02/2022   Status post bilateral hip replacements 10/29/2012   Swallowing difficulty  Swelling of both lower extremities    Thrombocytopenia, primary (HCC)    TTP (thrombotic thrombocytopenic purpura) (HCC)    1982   Vitamin D  deficiency 10/25/2018   Past Surgical History:  Procedure Laterality Date   BREAST LUMPECTOMY  08/03/11   left lumpectomy and slnbx,T1cN0,triple pos   CATARACT EXTRACTION, BILATERAL     COLONOSCOPY  01/26/2019   COLONOSCOPY  08/12/2020   elbow pins Left    FOOT SURGERY Left    multiple   POLYPECTOMY     PORTACATH PLACEMENT  08/03/2011   Procedure: INSERTION PORT-A-CATH;  Surgeon: Deward GORMAN Curvin DOUGLAS, MD;  Location: [facility] OR;  Service: General;  Laterality: Right;   portacath removal     THORACIC DISCECTOMY Left 06/21/2023   Procedure: Laminectomy and Foraminotomy - left -  Thoracic eleven-Thoracic twelve;  Surgeon: Louis Shove, MD;  Location: [facility] OR;  Service: Neurosurgery;  Laterality: Left;   TOTAL HIP ARTHROPLASTY Bilateral    UPPER GASTROINTESTINAL ENDOSCOPY     UPPER GASTROINTESTINAL ENDOSCOPY  08/12/2020   Patient Active Problem List   Diagnosis Date Noted   Thoracic radiculopathy 06/21/2023   PSVT (paroxysmal supraventricular tachycardia) (HCC) asymptomatic short runs on event monitor September 2024 06/03/2023   Mitral regurgitation 06/03/2023   Preoperative cardiovascular examination 06/03/2023   Allergy    Anxiety    Back pain    Chronic edema    Cold hands and feet    Complication of anesthesia    Environmental allergies    Eye pain    Fatigue    H/O varicella    H/O varicose veins    Hepatic cirrhosis (HCC)    Hepatitis C    History of measles, mumps, or rubella    Hyperlipidemia    Irregular heart beat    Joint pain    Leg cramps    Neuromuscular disorder (HCC)    Osteopenia    Palpitations    Rheumatoid arthritis (HCC)    Ringing in ear    Swallowing difficulty    Thrombocytopenia, primary (HCC)    TTP (thrombotic thrombocytopenic purpura) (HCC)    Severe persistent asthma without complication (HCC) 09/02/2022   Oral candidiasis 09/02/2022   Dry eye syndrome of both eyes 09/02/2022   Radiculopathy 07/2020   Moderate persistent asthma without complication 01/15/2020   History of cardiac arrhythmia 01/15/2020   History of breast cancer 01/15/2020   History of osteoporosis 01/15/2020   Intrinsic atopic dermatitis 01/15/2020   History of hepatitis C 01/15/2020   Cough 01/15/2020   Arthritis 11/10/2019   Prediabetes 10/25/2018   Class 2 severe obesity with serious comorbidity and body mass index (BMI) of 37.0 to 37.9 in adult 10/25/2018   Vitamin D  deficiency 10/25/2018   History of left hip replacement 08/29/2018   History of total right hip replacement 08/29/2018   Controlled type 2 diabetes mellitus without complication,  without long-term current use of insulin  (HCC) 07/11/2018   Chronic pain syndrome 07/01/2018   Cirrhosis (HCC) 05/10/2017   Gastroesophageal reflux disease 11/05/2015   Malignant neoplasm of axillary tail of left breast in female, estrogen receptor positive (HCC) 08/12/2015   Malignant neoplasm of upper-outer quadrant of left breast in female, estrogen receptor positive (HCC) 07/17/2014   Hypokalemia 12/29/2013   Other allergic rhinitis 10/29/2012   Status post bilateral hip replacements 10/29/2012   History of TTP (thrombotic thrombocytopenic purpura) 10/29/2012   Chronic hepatitis C without hepatic coma (HCC) 10/29/2012   Hx of radiation therapy  Lymphedema of arm 12/15/2011   Rosacea, acne 09/28/2011   Dysrhythmia, cardiac 09/28/2011   Hypothyroidism 09/07/2011   History of chemotherapy 2013    ONSET DATE: 05/29/24- referral date  REFERRING DIAG:  Diagnosis  G20.A1 (ICD-10-CM) - Parkinson's disease, unspecified whether dyskinesia present, unspecified whether manifestations fluctuate (HCC)    THERAPY DIAG:  Other symptoms and signs involving the nervous system - Plan: Ot plan of care cert/re-cert  Stiffness of left shoulder, not elsewhere classified - Plan: Ot plan of care cert/re-cert  Stiffness of right shoulder, not elsewhere classified - Plan: Ot plan of care cert/re-cert  Other lack of coordination - Plan: Ot plan of care cert/re-cert  Stiffness of right elbow, not elsewhere classified - Plan: Ot plan of care cert/re-cert  Stiffness of left elbow, not elsewhere classified - Plan: Ot plan of care cert/re-cert  Rationale for Evaluation and Treatment: Rehabilitation  SUBJECTIVE:   SUBJECTIVE STATEMENT: Pt reports difficulty with tremors Pt accompanied by: significant other  PERTINENT HISTORY: Hx per chart- 68 year old female with  complicated medical history of asthma, hypothyroidism, reflux disease, diabetes, hyperlipidemia, history of breast cancer with status  postsurgery and chemoradiation, anxiety, depression, history of hepatitis C in the context of blood transfusion, with a history of TTP, chronic liver disease, chronic back pain, rheumatoid arthritis, status post multiple surgeries including breast, back,  lumpectomy, cataract extractions, left foot surgery, polypectomy, bilateral total hip arthroplasties, chronic edema, chronic pain syndrome, and severe obesity with a BMI of over 40,  MD recommends starting carbidopa / levadopa 3x daily   Dat scan results IMPRESSION: Bilateral decreased radiotracer activity within LEFT and RIGHT striatum. Findings suggest Parkinsonian syndrome pathology.  PRECAUTIONS: Fall, lymphedema, hx of breast CA, hx of back surgery 1 year ago  WEIGHT BEARING RESTRICTIONS: No  PAIN:  Are you having pain? No  FALLS: Has patient fallen in last 6 months? No  LIVING ENVIRONMENT: Lives with: lives with their spouse Lives in: House/apartment   PLOF: Needs assistance with ADLs and Needs assistance with homemaking  PATIENT GOALS: manage tremors  OBJECTIVE:  Note: Objective measures were completed at Evaluation unless otherwise noted.  HAND DOMINANCE: Right  ADLs: Overall ADLs: increased time required Transfers/ambulation related to ADLs: Eating: mod I Grooming: mod I UB Dressing: needs assist donning shirt, jacket and adjusting bra straps LB Dressing: wears special slip on shoes  Toileting: wears pad for incontinence,  Bathing:mod I  Tub Shower transfers: walk in shower and tub, mod I   IADLs:  Handwriting: 75% legible and Mild micrographia  MOBILITY STATUS: Independent, small steps, decreased pace  POSTURE COMMENTS:  rounded shoulders and forward head  ACTIVITY TOLERANCE: Activity tolerance: decreased activity tolerance, fatigues quickly  FUNCTIONAL OUTCOME MEASURES: Fastening/unfastening 3 buttons: 45.67 Physical performance test: PPT#2 (simulated eating) 16.09 & PPT#4 (donning/doffing jacket):  unable  COORDINATION: 9 Hole Peg test: Right: 30.87 sec; Left: 42.91 sec Box and Blocks:  Right 44blocks, Left 39 blocks Tremors: Resting  UE ROM:  shoulder flexion: RUE 90, LUE 70, elbow extension: RUE -20, LUE -45 hx of elbow fx s/p surgery, elbow flexion 140 Pt with significant lymphadema in LUE s/p breast CA    SENSATION: Not tested    COGNITION: Overall cognitive status: to be [facility] for taks performed, further assess in a functional context  OBSERVATIONS: Bradykinesia and bilateral resting tremor left greater than right  TREATMENT DATE: 07/20/24- eval    PATIENT EDUCATION: Education details: role of OT, potential goals Person educated: Patient and Spouse Education method: Explanation Education comprehension: verbalized understanding  HOME EXERCISE PROGRAM: n/a  GOALS: Goals reviewed with patient? Yes  SHORT TERM GOALS: Target date: 08/19/24  I with HEP Baseline: Goal status: INITIAL  2.  I with adapted strategies/ AE to maximize pt's safety and I with ADLs/IADLS. Baseline:  Goal status: INITIAL  3.  Pt will demonstrate improved LUE functional use as evidenced by increaing box/blocks by 3 blocks Baseline: R 44, L 39 Goal status: INITIAL  4.  Pt will demonstrate improved ease with feeding as evidenced by decreasing PPT#2 to 13 secs or less. Baseline: 16.09 sec Goal status: INITIAL  5.  Pt will demonstrate ability to write a sentence with 100% legibility and minimimal decrease in letter size.  Goal status: INITIAL   LONG TERM GOALS: Target date: 10/12/24  Pt will demonstrate improved fine motor coordination for ADLs as evidenced by decreasing 9 hole peg test score for LUE by 3 secs. Baseline: R 30.87, L 42.91 Goal status: INITIAL  2.  Pt will demonstrate improved ease with fastening buttons as evidenced by decreasing 3 button/ unbutton  score to 42 secs or less. Baseline:  Goal status: INITIAL  3.  Pt will verbalize understanding of community resources and ways to prevent PD related complications. Baseline:  Goal status: INITIAL  4.  Pt will demonstrate ability to retrieve a lightweight object at 100 shoulder flexion with -15 elbow extension with RUE.  Goal status: INITIAL  5.  Pt will demonstrate ability to retrieve a lightweight object at 80 shoulder flexion with LUE Goal status: INITIAL  ASSESSMENT:  CLINICAL IMPRESSION: Patient is a 68 y.o. female who was seen today for occupational therapy evaluation for Parkinson's disease Pt has a complicated medical history including back surgery and breast CA with lymphedema. Pt presents with the performance deficits below. She can benefit from skilled occupational therapy to address these deficits in order to maximize pt's safety and I with ADLS/IADLs.  PERFORMANCE DEFICITS: in functional skills including ADLs, IADLs, coordination, dexterity, edema, ROM, strength, flexibility, Fine motor control, Gross motor control, mobility, balance, endurance, decreased knowledge of precautions, and decreased knowledge of use of DME,  and psychosocial skills including coping strategies, environmental adaptation, habits, interpersonal interactions, and routines and behaviors.   IMPAIRMENTS: are limiting patient from ADLs, IADLs, rest and sleep, play, leisure, and social participation.   COMORBIDITIES:  may have co-morbidities  that affects occupational performance. Patient will benefit from skilled OT to address above impairments and improve overall function.  MODIFICATION OR ASSISTANCE TO COMPLETE EVALUATION: No modification of tasks or assist necessary to complete an evaluation.  OT OCCUPATIONAL PROFILE AND HISTORY: Detailed assessment: Review of records and additional review of physical, cognitive, psychosocial history related to current functional performance.  CLINICAL DECISION MAKING:  Moderate - several treatment options, min-mod task modification necessary  REHAB POTENTIAL: Good  EVALUATION COMPLEXITY: Low    PLAN:  OT FREQUENCY: 2x/week plus eval  OT DURATION: 12 weeks  PLANNED INTERVENTIONS: 97168 OT Re-evaluation, 97535 self care/ADL training, 02889 therapeutic exercise, 97530 therapeutic activity, 97112 neuromuscular re-education, 97140 manual therapy, 97113 aquatic therapy, 97035 ultrasound, 97018 paraffin, 02989 moist heat, 97010 cryotherapy, 97750 Physical Performance Testing, 02239 Orthotic Initial, [id number] Orthotic/Prosthetic subsequent, passive range of motion, balance training, functional mobility training, visual/perceptual remediation/compensation, psychosocial skills training, energy conservation, coping strategies training, patient/family education, and DME and/or AE instructions  RECOMMENDED  OTHER SERVICES: Pt may benefit from PT in the future for lymphadema  CONSULTED AND AGREED WITH PLAN OF CARE: Patient and family member/caregiver  PLAN FOR NEXT SESSION: initial HEP   Deriana Vanderhoef, OT 07/21/2024, 10:47 AM

## 2024-07-21 ENCOUNTER — Encounter: Payer: Self-pay | Admitting: Occupational Therapy

## 2024-07-24 ENCOUNTER — Encounter: Payer: Self-pay | Admitting: Occupational Therapy

## 2024-07-24 ENCOUNTER — Ambulatory Visit: Admitting: Occupational Therapy

## 2024-07-24 DIAGNOSIS — M25612 Stiffness of left shoulder, not elsewhere classified: Secondary | ICD-10-CM

## 2024-07-24 DIAGNOSIS — M25611 Stiffness of right shoulder, not elsewhere classified: Secondary | ICD-10-CM

## 2024-07-24 DIAGNOSIS — R278 Other lack of coordination: Secondary | ICD-10-CM

## 2024-07-24 DIAGNOSIS — R29818 Other symptoms and signs involving the nervous system: Secondary | ICD-10-CM

## 2024-07-24 DIAGNOSIS — M25622 Stiffness of left elbow, not elsewhere classified: Secondary | ICD-10-CM

## 2024-07-24 DIAGNOSIS — M25621 Stiffness of right elbow, not elsewhere classified: Secondary | ICD-10-CM

## 2024-07-24 NOTE — Therapy (Addendum)
 OUTPATIENT OCCUPATIONAL THERAPY [facility]  Patient Name: ZAKIYA SPORRER MRN: [id number] DOB:[birth year], 68 y.o., female Today's Date: 07/24/2024  PCP: Dr. Perri REFERRING PROVIDER: Dr. Perri  END OF SESSION:  OT End of Session - 07/24/24 1406     Visit Number 2    Number of Visits 25    Date for Recertification  10/12/24    Authorization Type Medicare    Authorization - Number of Visits 10    OT Start Time 1405    OT Stop Time 1445    OT Time Calculation (min) 40 min    Activity Tolerance Patient tolerated treatment well    Behavior During Therapy [facility] for tasks assessed/performed           Past Medical History:  Diagnosis Date   Allergy    Anxiety    Arthritis    Back pain    Chronic edema    Chronic hepatitis C without hepatic coma (HCC) 10/29/2012   Chronic pain syndrome 07/01/2018   Cirrhosis (HCC) 05/10/2017   Class 2 severe obesity with serious comorbidity and body mass index (BMI) of 37.0 to 37.9 in adult 10/25/2018   Cold hands and feet    Complication of anesthesia    difficulty waking up/dizzy/lightheaded   Controlled type 2 diabetes mellitus without complication, without long-term current use of insulin  (HCC) 07/11/2018   Cough    Dry eye syndrome of both eyes 09/02/2022   Dysrhythmia, cardiac 09/28/2011   She has no records with her today. She tells me that a few years ago in [place] she had palpitations and a cardiology placed her on digoxin , she does not know what type of dysrhythmia. She has not had any palpitations recently     Environmental allergies    Eye pain    Fatigue    Gastroesophageal reflux disease 11/05/2015   H/O varicella    H/O varicose veins    Hepatic cirrhosis (HCC)    Hepatitis C    History of breast cancer 01/15/2020   History of cardiac arrhythmia 01/15/2020   History of chemotherapy 2013   left breast cancer   History of hepatitis C 01/15/2020   History of left hip replacement 08/29/2018   History of measles,  mumps, or rubella    History of osteoporosis 01/15/2020   History of total right hip replacement 08/29/2018   History of TTP (thrombotic thrombocytopenic purpura) 10/29/2012   Hx of radiation therapy 12/15/11 - 01/29/12   left breast   Hyperlipidemia    Hypokalemia 12/29/2013   Hypothyroidism    Intrinsic atopic dermatitis 01/15/2020   Irregular heart beat    under control   Joint pain    Leg cramps    Lymphedema of arm 12/15/2011   Malignant neoplasm of axillary tail of left breast in female, estrogen receptor positive (HCC) 08/12/2015   Malignant neoplasm of upper-outer quadrant of left breast in female, estrogen receptor positive (HCC) 07/17/2014   Moderate persistent asthma without complication 01/15/2020   Neuromuscular disorder (HCC)    left foot nerve damage- neuropathy    Oral candidiasis 09/02/2022   Osteopenia    Other allergic rhinitis 10/29/2012   Palpitations    Prediabetes 10/25/2018   Radiculopathy 07/2020   Rheumatoid arthritis (HCC)    Ringing in ear    Rosacea, acne 09/28/2011   Severe persistent asthma without complication (HCC) 09/02/2022   Status post bilateral hip replacements 10/29/2012   Swallowing difficulty    Swelling of both lower extremities  Thrombocytopenia, primary (HCC)    TTP (thrombotic thrombocytopenic purpura) (HCC)    1982   Vitamin D  deficiency 10/25/2018   Past Surgical History:  Procedure Laterality Date   BREAST LUMPECTOMY  08/03/11   left lumpectomy and slnbx,T1cN0,triple pos   CATARACT EXTRACTION, BILATERAL     COLONOSCOPY  01/26/2019   COLONOSCOPY  08/12/2020   elbow pins Left    FOOT SURGERY Left    multiple   POLYPECTOMY     PORTACATH PLACEMENT  08/03/2011   Procedure: INSERTION PORT-A-CATH;  Surgeon: Deward GORMAN Curvin DOUGLAS, MD;  Location: [facility] OR;  Service: General;  Laterality: Right;   portacath removal     THORACIC DISCECTOMY Left 06/21/2023   Procedure: Laminectomy and Foraminotomy - left - Thoracic eleven-Thoracic twelve;   Surgeon: Louis Shove, MD;  Location: [facility] OR;  Service: Neurosurgery;  Laterality: Left;   TOTAL HIP ARTHROPLASTY Bilateral    UPPER GASTROINTESTINAL ENDOSCOPY     UPPER GASTROINTESTINAL ENDOSCOPY  08/12/2020   Patient Active Problem List   Diagnosis Date Noted   Thoracic radiculopathy 06/21/2023   PSVT (paroxysmal supraventricular tachycardia) (HCC) asymptomatic short runs on event monitor September 2024 06/03/2023   Mitral regurgitation 06/03/2023   Preoperative cardiovascular examination 06/03/2023   Allergy    Anxiety    Back pain    Chronic edema    Cold hands and feet    Complication of anesthesia    Environmental allergies    Eye pain    Fatigue    H/O varicella    H/O varicose veins    Hepatic cirrhosis (HCC)    Hepatitis C    History of measles, mumps, or rubella    Hyperlipidemia    Irregular heart beat    Joint pain    Leg cramps    Neuromuscular disorder (HCC)    Osteopenia    Palpitations    Rheumatoid arthritis (HCC)    Ringing in ear    Swallowing difficulty    Thrombocytopenia, primary (HCC)    TTP (thrombotic thrombocytopenic purpura) (HCC)    Severe persistent asthma without complication (HCC) 09/02/2022   Oral candidiasis 09/02/2022   Dry eye syndrome of both eyes 09/02/2022   Radiculopathy 07/2020   Moderate persistent asthma without complication 01/15/2020   History of cardiac arrhythmia 01/15/2020   History of breast cancer 01/15/2020   History of osteoporosis 01/15/2020   Intrinsic atopic dermatitis 01/15/2020   History of hepatitis C 01/15/2020   Cough 01/15/2020   Arthritis 11/10/2019   Prediabetes 10/25/2018   Class 2 severe obesity with serious comorbidity and body mass index (BMI) of 37.0 to 37.9 in adult 10/25/2018   Vitamin D  deficiency 10/25/2018   History of left hip replacement 08/29/2018   History of total right hip replacement 08/29/2018   Controlled type 2 diabetes mellitus without complication, without long-term current use of  insulin  (HCC) 07/11/2018   Chronic pain syndrome 07/01/2018   Cirrhosis (HCC) 05/10/2017   Gastroesophageal reflux disease 11/05/2015   Malignant neoplasm of axillary tail of left breast in female, estrogen receptor positive (HCC) 08/12/2015   Malignant neoplasm of upper-outer quadrant of left breast in female, estrogen receptor positive (HCC) 07/17/2014   Hypokalemia 12/29/2013   Other allergic rhinitis 10/29/2012   Status post bilateral hip replacements 10/29/2012   History of TTP (thrombotic thrombocytopenic purpura) 10/29/2012   Chronic hepatitis C without hepatic coma (HCC) 10/29/2012   Hx of radiation therapy    Lymphedema of arm 12/15/2011   Rosacea, acne  09/28/2011   Dysrhythmia, cardiac 09/28/2011   Hypothyroidism 09/07/2011   History of chemotherapy 2013    ONSET DATE: 05/29/24- referral date  REFERRING DIAG:  Diagnosis  G20.A1 (ICD-10-CM) - Parkinson's disease, unspecified whether dyskinesia present, unspecified whether manifestations fluctuate (HCC)    THERAPY DIAG:  Other symptoms and signs involving the nervous system  Stiffness of left shoulder, not elsewhere classified  Stiffness of right shoulder, not elsewhere classified  Other lack of coordination  Stiffness of left elbow, not elsewhere classified  Stiffness of right elbow, not elsewhere classified  Rationale for Evaluation and Treatment: Rehabilitation  SUBJECTIVE:   SUBJECTIVE STATEMENT: Pt reports getting her hair done today Pt accompanied by: significant other  PERTINENT HISTORY: Hx per chart- 68 year old female with  complicated medical history of asthma, hypothyroidism, reflux disease, diabetes, hyperlipidemia, history of breast cancer with status postsurgery and chemoradiation, anxiety, depression, history of hepatitis C in the context of blood transfusion, with a history of TTP, chronic liver disease, chronic back pain, rheumatoid arthritis, status post multiple surgeries including breast,  back,  lumpectomy, cataract extractions, left foot surgery, polypectomy, bilateral total hip arthroplasties, chronic edema, chronic pain syndrome, and severe obesity with a BMI of over 40,  MD recommends starting carbidopa / levadopa 3x daily   Dat scan results IMPRESSION: Bilateral decreased radiotracer activity within LEFT and RIGHT striatum. Findings suggest Parkinsonian syndrome pathology.  PRECAUTIONS: Fall, lymphedema, hx of breast CA, hx of back surgery 1 year ago  WEIGHT BEARING RESTRICTIONS: No  PAIN:  Are you having pain? No  FALLS: Has patient fallen in last 6 months? No  LIVING ENVIRONMENT: Lives with: lives with their spouse Lives in: House/apartment   PLOF: Needs assistance with ADLs and Needs assistance with homemaking  PATIENT GOALS: manage tremors  OBJECTIVE:  Note: Objective measures were completed at Evaluation unless otherwise noted.  HAND DOMINANCE: Right  ADLs: Overall ADLs: increased time required Transfers/ambulation related to ADLs: Eating: mod I Grooming: mod I UB Dressing: needs assist donning shirt, jacket and adjusting bra straps LB Dressing: wears special slip on shoes  Toileting: wears pad for incontinence,  Bathing:mod I  Tub Shower transfers: walk in shower and tub, mod I   IADLs:  Handwriting: 75% legible and Mild micrographia  MOBILITY STATUS: Independent, small steps, decreased pace  POSTURE COMMENTS:  rounded shoulders and forward head  ACTIVITY TOLERANCE: Activity tolerance: decreased activity tolerance, fatigues quickly  FUNCTIONAL OUTCOME MEASURES: Fastening/unfastening 3 buttons: 45.67 Physical performance test: PPT#2 (simulated eating) 16.09 & PPT#4 (donning/doffing jacket): unable  COORDINATION: 9 Hole Peg test: Right: 30.87 sec; Left: 42.91 sec Box and Blocks:  Right 44blocks, Left 39 blocks Tremors: Resting  UE ROM:  shoulder flexion: RUE 90, LUE 70, elbow extension: RUE -20, LUE -45 hx of elbow fx s/p  surgery, elbow flexion 140 Pt with significant lymphedema in LUE s/p breast CA    SENSATION: Not tested    COGNITION: Overall cognitive status: to be [facility] for taks performed, further assess in a functional context  OBSERVATIONS: Bradykinesia and bilateral resting tremor left greater than right  TREATMENT DATE: 07/24/24-Supine PWR! up followed by closed chain chest press and shoulder flexion,min-mod v.c and facilitation  Pt performed shoulder abdcution followed by clap overhead in supine, min-mod v.c    07/20/24- eval    PATIENT EDUCATION: Education details:inital HEP cane in supine, PWR! hands, flipping and dealing playing cards Person educated: Patient and Spouse Education method: Explanation, demonstration, handout Education comprehension: verbalized understanding, returned demonstration  HOME EXERCISE PROGRAM: supine cane, PWR! hands, flipping and dealing cards 11/24  GOALS: Goals reviewed with patient? Yes  SHORT TERM GOALS: Target date: 08/19/24  I with HEP Baseline: Goal status:  ongoing issued 07/25/23  2.  I with adapted strategies/ AE to maximize pt's safety and I with ADLs/IADLS. Baseline:  Goal status: INITIAL  3.  Pt will demonstrate improved LUE functional use as evidenced by increasing box/blocks by 3 blocks Baseline: R 44, L 39 Goal status: INITIAL  4.  Pt will demonstrate improved ease with feeding as evidenced by decreasing PPT#2 to 13 secs or less. Baseline: 16.09 sec Goal status: INITIAL  5.  Pt will demonstrate ability to write a sentence with 100% legibility and minimimal decrease in letter size.  Goal status: INITIAL   LONG TERM GOALS: Target date: 10/12/24  Pt will demonstrate improved fine motor coordination for ADLs as evidenced by decreasing 9 hole peg test score for LUE by 3 secs. Baseline: R 30.87, L 42.91 Goal  status: INITIAL  2.  Pt will demonstrate improved ease with fastening buttons as evidenced by decreasing 3 button/ unbutton score to 42 secs or less. Baseline:  Goal status: INITIAL  3.  Pt will verbalize understanding of community resources and ways to prevent PD related complications. Baseline:  Goal status: INITIAL  4.  Pt will demonstrate ability to retrieve a lightweight object at 100 shoulder flexion with -15 elbow extension with RUE.  Goal status: INITIAL  5.  Pt will demonstrate ability to retrieve a lightweight object at 80 shoulder flexion with LUE Goal status: INITIAL  ASSESSMENT:  CLINICAL IMPRESSION: Patient is progressing towards goals. she demonstrates understanding of inital HEP.  PERFORMANCE DEFICITS: in functional skills including ADLs, IADLs, coordination, dexterity, edema, ROM, strength, flexibility, Fine motor control, Gross motor control, mobility, balance, endurance, decreased knowledge of precautions, and decreased knowledge of use of DME,  and psychosocial skills including coping strategies, environmental adaptation, habits, interpersonal interactions, and routines and behaviors.   IMPAIRMENTS: are limiting patient from ADLs, IADLs, rest and sleep, play, leisure, and social participation.   COMORBIDITIES:  may have co-morbidities  that affects occupational performance. Patient will benefit from skilled OT to address above impairments and improve overall function.  MODIFICATION OR ASSISTANCE TO COMPLETE EVALUATION: No modification of tasks or assist necessary to complete an evaluation.  OT OCCUPATIONAL PROFILE AND HISTORY: Detailed assessment: Review of records and additional review of physical, cognitive, psychosocial history related to current functional performance.  CLINICAL DECISION MAKING: Moderate - several treatment options, min-mod task modification necessary  REHAB POTENTIAL: Good  EVALUATION COMPLEXITY: Low    PLAN:  OT FREQUENCY: 2x/week  plus eval  OT DURATION: 12 weeks  PLANNED INTERVENTIONS: 97168 OT Re-evaluation, 97535 self care/ADL training, 02889 therapeutic exercise, 97530 therapeutic activity, 97112 neuromuscular re-education, 97140 manual therapy, 97113 aquatic therapy, 97035 ultrasound, 97018 paraffin, 02989 moist heat, 97010 cryotherapy, 97750 Physical Performance Testing, 02239 Orthotic Initial, [id number] Orthotic/Prosthetic subsequent, passive range of motion, balance training, functional mobility training, visual/perceptual remediation/compensation, psychosocial skills training, energy conservation, coping strategies training, patient/family education, and DME and/or  AE instructions  RECOMMENDED OTHER SERVICES: Pt may benefit from PT in the future for lymphadema  CONSULTED AND AGREED WITH PLAN OF CARE: Patient and family member/caregiver  PLAN FOR NEXT SESSION: initial HEP review, add coordination activities   Gabryel Talamo, OT 07/24/2024, 2:07 PM

## 2024-07-24 NOTE — Patient Instructions (Signed)
 Laying on your back squeeze your shoulder blades down towards the bed, hold 3 secs, 10 reps 1x day    ELBOW: Extension / Chest Press holding paper towel roll    Lie on back with knees bent. Straighten elbows to raise frame. Hold _5__ seconds.  5-10___ reps per set, _1__ sets per day, 7___ days per week Use steering wheel or hula hoop instead of frame.  Copyright  VHI. All rights reserved.  Shoulder: Flexion (Supine)    With hands shoulder width apart, slowly lower dowel to floor behind head. Do not let elbows bend. Keep back flat. Hold __5__ seconds. Repeat __5-10__ times. Do __1__ sessions per day. CAUTION: Stretch slowly and gently.  Copyright  VHI. All rights reserved.            PWR! Hands  With arms stretched out in front of you (elbows straight), perform the following: PWR! Rock: Move wrists up and down [name]! Twist: Twist palms up and down BIG  Then, start with elbows bent and hands closed. PWR! Step: Touch index finger to thumb while keeping other fingers straight. Flick fingers out BIG (thumb out/straighten fingers). Repeat with other fingers. (Step your thumb to each finger). PWR! Hands: Push hands out BIG. Elbows straight, wrists up, fingers open and spread apart BIG. (Can also perform by pushing down on table, chair, knees. Push above head, out to the side, behind you, in front of you.)   ** Make each movement big and deliberate so that you feel the movement.  Perform at least 10 repetitions 1x/day, but perform PWR! hands throughout the day when you are having trouble using your hands (picking up/manipulating small objects, writing, eating, typing, sewing, buttoning, etc.).   Coordination Exercises  Perform the following exercises for 5-10 minutes 1 times per day. Perform with both hand(s). Perform using big movements.  Flipping Cards: Place deck of cards on the table. Flip cards over by opening your hand big to grasp and then turn your palm up  big. Deal cards: Hold 1/2 or whole deck in your hand. Use thumb to push card off top of deck with one big push. Perform Flicks/hand stretches (PWR! Hands): Close hands then flick out your fingers with focus on opening hands, pulling wrists back, and extending elbows like you are pushing.

## 2024-07-25 ENCOUNTER — Ambulatory Visit (INDEPENDENT_AMBULATORY_CARE_PROVIDER_SITE_OTHER)

## 2024-07-25 DIAGNOSIS — J4550 Severe persistent asthma, uncomplicated: Secondary | ICD-10-CM

## 2024-08-03 NOTE — Progress Notes (Incomplete)
 Annual Wellness Visit   Patient Care Team: Baxley, Tara PARAS, MD as PCP - General (Internal Medicine) Marcey Elspeth PARAS, MD as Consulting Physician (Ophthalmology)  Visit Date: 08/03/24   No chief complaint on file.  Subjective:  Patient: Tara [name], Female DOB: 01-Dec-1955, 68 y.o. MRN: [id number] There were no vitals filed for this visit. Tara [name] is a 68 y.o. Female who presents today for her Annual Wellness Visit. Patient has Hypothyroidism; Rosacea, acne; Dysrhythmia, cardiac; Lymphedema of arm; Hx of radiation therapy; Other allergic rhinitis; Status post bilateral hip replacements; History of TTP (thrombotic thrombocytopenic purpura); Chronic hepatitis C without hepatic coma (HCC); Hypokalemia; Malignant neoplasm of upper-outer quadrant of left breast in female, estrogen receptor positive (HCC); Malignant neoplasm of axillary tail of left breast in female, estrogen receptor positive (HCC); Cirrhosis (HCC); Gastroesophageal reflux disease; Controlled type 2 diabetes mellitus without complication, without long-term current use of insulin  (HCC); History of left hip replacement; History of total right hip replacement; Prediabetes; Class 2 severe obesity with serious comorbidity and body mass index (BMI) of 37.0 to 37.9 in adult; Vitamin D  deficiency; Arthritis; Moderate persistent asthma without complication; History of cardiac arrhythmia; History of breast cancer; History of osteoporosis; Intrinsic atopic dermatitis; History of hepatitis C; Cough; Severe persistent asthma without complication (HCC); Oral candidiasis; Dry eye syndrome of both eyes; Allergy; Anxiety; Back pain; Chronic edema; Cold hands and feet; Complication of anesthesia; Environmental allergies; Eye pain; Fatigue; H/O varicella; H/O varicose veins; Hepatic cirrhosis (HCC); Hepatitis C; History of chemotherapy; History of measles, mumps, or rubella; Hyperlipidemia; Irregular heart beat; Joint pain; Leg cramps; Neuromuscular  disorder (HCC); Osteopenia; Palpitations; Radiculopathy; Rheumatoid arthritis (HCC); Ringing in ear; Swallowing difficulty; Thrombocytopenia, primary (HCC); TTP (thrombotic thrombocytopenic purpura) (HCC); Chronic pain syndrome; PSVT (paroxysmal supraventricular tachycardia) (HCC) asymptomatic short runs on event monitor September 2024; Mitral regurgitation; Preoperative cardiovascular examination; and Thoracic radiculopathy on their problem list.   History of Primary parkinsonism treated with sinemet  IR 25-100 mg three times daily.   Dysrhythmia treated with Metoprolol  tartrate 12.5 twice daily, as needed for tachycardia. Pulse today is 97. Blood pressure today is normal at  120/80      History of Hyperlipidemia treated with Rosuvastatin  5 mg daily     History of Impaired glucose tolerance treated with metformin  500 mg twice daily.  Recent Hgb AIC in June was normal at 5.6%   History of hypothyroidism treated with levothyroxine  88 mcg daily     05/12/2024 Brain Datascan Bilateral decreased radiotracer activity within LEFT and RIGHT striatum. Findings suggest Parkinsonian syndrome pathology.       She has switched from taking Benadryl  and has started to take Allegra  for her allergies.    Labs ***/***/*** {Labs (Optional):31667}   01/31/2024 Mammogram No mammographic evidence of malignancy. Repeat in one year.    08/12/2020 Colonoscopy Diverticulosis in the sigmoid colon and in the descending colon. One 3 mm possible polyp in the descending colon. Resected and retrieved. One 3 mm polyp at the hepatic flexure. Resected and retrieved. Pathology found to be tubular adenoma and inflammatory polyp not associated with colon cancer. The examination was otherwise normal on direct and retroflexion views. Repeat in 3 years. Health Maintenance  Topic Date Due   Colonoscopy  08/13/2023   HEMOGLOBIN A1C  08/01/2024   FOOT EXAM  08/08/2024   Diabetic kidney evaluation - eGFR measurement  01/30/2025    Diabetic kidney evaluation - Urine ACR  01/30/2025   Mammogram  01/30/2025   Medicare Annual  Wellness (AWV)  02/08/2025   OPHTHALMOLOGY EXAM  02/15/2025   DTaP/Tdap/Td (3 - Td or Tdap) 09/04/2025   Pneumococcal Vaccine: 50+ Years  Completed   Influenza Vaccine  Completed   Bone Density Scan  Completed   Hepatitis C Screening  Completed   Zoster Vaccines- Shingrix  Completed   Meningococcal B Vaccine  Aged Out   Hepatitis B Vaccines 19-59 Average Risk  Discontinued   COVID-19 Vaccine  Discontinued    {Man or Woman:32389}  Vaccine Counseling: Due for {Vaccines:32291::Influenza}; UTD on {Vaccines:32291::Influenza}  ROS Objective:  Vitals: body mass index is unknown because there is no height or weight on file.There were no vitals filed for this visit. Physical Exam  Current Outpatient Medications  Medication Instructions   acetaminophen  (TYLENOL ) 500 mg, Every 6 hours PRN   ADVAIR  HFA 45-21 MCG/ACT inhaler 2 puffs, Inhalation, 2 times daily   albuterol  (VENTOLIN  HFA) 108 (90 Base) MCG/ACT inhaler Two puffs every 4 hours if needed for wheezing or coughing .  May use 2 puffs 5-15 minutes prior to exercise.   azelastine  (ASTELIN ) 0.1 % nasal spray 1-2 sprays, Each Nare, 2 times daily PRN   benzonatate  (TESSALON ) 100 mg, Oral, 3 times daily PRN   carbidopa -levodopa  (SINEMET  IR) 25-100 MG tablet 1 tablet, Oral, 3 times daily, Take at 9 AM, 1 PM and 7 PM daily.   clobetasol  ointment (TEMOVATE ) 0.05 % 1 Application, Topical, 2 times daily   famotidine  (PEPCID ) 20 MG tablet TAKE 1 TABLET BY MOUTH DAILY 2 TIMES A DAY. MUST KEEP OFFICE VISIT FOR FURTHER REFILLS   fexofenadine  (ALLEGRA ) 180 mg, Oral, Daily   fluticasone  (FLONASE  SENSIMIST) 27.5 MCG/SPRAY nasal spray 2 sprays, Nasal, Daily   Glucosamine-Chondroitin (MOVE FREE PO) 1 tablet, Every morning   levothyroxine  (SYNTHROID ) 88 mcg, Oral, Daily   magnesium  oxide (MAG-OX) 400 mg, Every morning   metFORMIN  (GLUCOPHAGE ) 500 mg, Oral, 2  times daily with meals   montelukast  (SINGULAIR ) 10 mg, Oral, Daily at bedtime   nystatin  (MYCOSTATIN ) 100000 UNIT/ML suspension 5 mLs, 4 times daily PRN   omeprazole  (PRILOSEC ) 20 MG capsule Oral, Daily   Polyethyl Glycol-Propyl Glycol (SYSTANE) 0.4-0.3 % SOLN 1-2 drops, 3 times daily PRN   Probiotic Product (PROBIOTIC PO) 1 capsule, Every morning   rosuvastatin  (CRESTOR ) 5 mg, Oral, Daily   Spacer/Aero-Holding Chambers (AEROCHAMBER MV) inhaler Use as instructed   triamcinolone  ointment (KENALOG ) 0.1 % 1 Application, Topical, 2 times daily   VITAMIN D , CHOLECALCIFEROL, PO 10,000 Units, Daily   Past Medical History:  Diagnosis Date   Allergy    Anxiety    Arthritis    Back pain    Chronic edema    Chronic hepatitis C without hepatic coma (HCC) 10/29/2012   Chronic pain syndrome 07/01/2018   Cirrhosis (HCC) 05/10/2017   Class 2 severe obesity with serious comorbidity and body mass index (BMI) of 37.0 to 37.9 in adult 10/25/2018   Cold hands and feet    Complication of anesthesia    difficulty waking up/dizzy/lightheaded   Controlled type 2 diabetes mellitus without complication, without long-term current use of insulin  (HCC) 07/11/2018   Cough    Dry eye syndrome of both eyes 09/02/2022   Dysrhythmia, cardiac 09/28/2011   She has no records with her today. She tells me that a few years ago in [place] she had palpitations and a cardiology placed her on digoxin , she does not know what type of dysrhythmia. She has not had any palpitations recently  Environmental allergies    Eye pain    Fatigue    Gastroesophageal reflux disease 11/05/2015   H/O varicella    H/O varicose veins    Hepatic cirrhosis (HCC)    Hepatitis C    History of breast cancer 01/15/2020   History of cardiac arrhythmia 01/15/2020   History of chemotherapy 2013   left breast cancer   History of hepatitis C 01/15/2020   History of left hip replacement 08/29/2018   History of measles, mumps, or rubella     History of osteoporosis 01/15/2020   History of total right hip replacement 08/29/2018   History of TTP (thrombotic thrombocytopenic purpura) 10/29/2012   Hx of radiation therapy 12/15/11 - 01/29/12   left breast   Hyperlipidemia    Hypokalemia 12/29/2013   Hypothyroidism    Intrinsic atopic dermatitis 01/15/2020   Irregular heart beat    under control   Joint pain    Leg cramps    Lymphedema of arm 12/15/2011   Malignant neoplasm of axillary tail of left breast in female, estrogen receptor positive (HCC) 08/12/2015   Malignant neoplasm of upper-outer quadrant of left breast in female, estrogen receptor positive (HCC) 07/17/2014   Moderate persistent asthma without complication 01/15/2020   Neuromuscular disorder (HCC)    left foot nerve damage- neuropathy    Oral candidiasis 09/02/2022   Osteopenia    Other allergic rhinitis 10/29/2012   Palpitations    Prediabetes 10/25/2018   Radiculopathy 07/2020   Rheumatoid arthritis (HCC)    Ringing in ear    Rosacea, acne 09/28/2011   Severe persistent asthma without complication (HCC) 09/02/2022   Status post bilateral hip replacements 10/29/2012   Swallowing difficulty    Swelling of both lower extremities    Thrombocytopenia, primary (HCC)    TTP (thrombotic thrombocytopenic purpura) (HCC)    1982   Vitamin D  deficiency 10/25/2018   Medical/Surgical History Narrative:  Allergic/Intolerant to:  Allergies  Allergen Reactions   Aspirin Other (See Comments)    Pt has a hx of TTP.    Nsaids Other (See Comments)    Pt has a hx of TTP.    *** - ***  *** - ***  *** - ***  *** - ***  *** - ***  *** - ***  *** - ***  *** - *** Other - Hx of: *** ; Surghx of: *** Past Surgical History:  Procedure Laterality Date   BREAST LUMPECTOMY  08/03/11   left lumpectomy and slnbx,T1cN0,triple pos   CATARACT EXTRACTION, BILATERAL     COLONOSCOPY  01/26/2019   COLONOSCOPY  08/12/2020   elbow pins Left    FOOT SURGERY Left     multiple   POLYPECTOMY     PORTACATH PLACEMENT  08/03/2011   Procedure: INSERTION PORT-A-CATH;  Surgeon: Deward GORMAN Curvin DOUGLAS, MD;  Location: [facility] OR;  Service: General;  Laterality: Right;   portacath removal     THORACIC DISCECTOMY Left 06/21/2023   Procedure: Laminectomy and Foraminotomy - left - Thoracic eleven-Thoracic twelve;  Surgeon: Louis Shove, MD;  Location[facility];  Service: Neurosurgery;  Laterality: Left;   TOTAL HIP ARTHROPLASTY Bilateral    UPPER GASTROINTESTINAL ENDOSCOPY     UPPER GASTROINTESTINAL ENDOSCOPY  08/12/2020   Family History  Problem Relation Age of Onset   Pneumonia Mother    COPD Mother    Colon polyps Mother    Heart disease Mother    Thyroid  disease Mother    Obesity Mother  COPD Father    Obesity Father    Brain cancer Maternal Grandfather    Alcohol  abuse Maternal Grandfather    Hyperlipidemia Neg Hx    Hypertension Neg Hx    Colon cancer Neg Hx    Rectal cancer Neg Hx    Stomach cancer Neg Hx    Esophageal cancer Neg Hx    Parkinson's disease Neg Hx    Family History Narrative: {ELFamHX:31110} Social History   Social History Narrative   Pt lives with husband    Retired    Caffeine 24oz cup a day    Most Recent Health Risks Assessment:   Most Recent Social Determinants of Health (Including Hx of Tobacco, Alcohol , and Drug Use) SDOH Screenings   Food Insecurity: No Food Insecurity (02/08/2024)  Housing: Low Risk  (02/08/2024)  Transportation Needs: No Transportation Needs (02/08/2024)  Utilities: Not At Risk (02/08/2024)  Alcohol  Screen: Low Risk  (02/08/2024)  Depression (PHQ2-9): Low Risk  (02/10/2024)  Financial Resource Strain: Low Risk  (02/08/2024)  Physical Activity: Insufficiently Active (02/08/2024)  Social Connections: Moderately Isolated (02/08/2024)  Stress: No Stress Concern Present (02/08/2024)  Tobacco Use: Low Risk  (07/24/2024)  Health Literacy: Adequate Health Literacy (02/08/2024)   Social History   Tobacco Use   Smoking  status: Never   Smokeless tobacco: Never  Vaping Use   Vaping status: Never Used  Substance Use Topics   Alcohol  use: Yes    Alcohol /week: 1.0 standard drink of alcohol     Types: 1 Glasses of wine per week    Comment: occ   Drug use: No   Most Recent Functional Status Assessment:    02/09/2024    1:59 PM  In your present state of health, do you have any difficulty performing the following activities:  Hearing? 0  Vision? 0  Difficulty concentrating or making decisions? 0  Walking or climbing stairs? 1  Dressing or bathing? 0  Doing errands, shopping? 1  Preparing Food and eating ? N  Using the Toilet? N  In the past six months, have you accidently leaked urine? N  Do you have problems with loss of bowel control? N  Managing your Medications? N  Managing your Finances? N  Housekeeping or managing your Housekeeping? Y   Most Recent Fall Risk Assessment:    02/09/2024    2:00 PM  Fall Risk   Falls in the past year? 0  Number falls in past yr: 0  Injury with Fall? 0   Risk for fall due to : No Fall Risks  Follow up Falls prevention discussed;Education provided;Falls evaluation completed     Data saved with a previous flowsheet row definition   Most Recent Anxiety/Depression Screenings:    02/10/2024   11:26 AM 02/09/2024    2:06 PM  PHQ 2/9 Scores  PHQ - 2 Score 0 0  PHQ- 9 Score 0       Data saved with a previous flowsheet row definition      11/10/2018    1:04 PM 10/25/2018   10:55 AM  GAD 7 : Generalized Anxiety Score  Nervous, Anxious, on Edge 0 0  Control/stop worrying 1 0  Worry too much - different things 1 2  Trouble relaxing 0 0  Restless 1 1  Easily annoyed or irritable 0 0  Afraid - awful might happen 0 0  Total GAD 7 Score 3 3  Anxiety Difficulty Not difficult at all Somewhat difficult   Most Recent Cognitive Screening:  02/08/2023    2:02 PM  6CIT Screen  What Year? 0 points  What month? 0 points  What time? 0 points  Count back from 20  0 points  Months in reverse 0 points  Repeat phrase 0 points  Total Score 0 points   Most Recent Vision/Hearing Screenings:No results found. Results:  Studies Obtained And Personally Reviewed By Me: Diabetic Foot Exam - Simple   No data filed     {Imaging, colonoscopy, mammogram, bone density scan, echocardiogram, heart cath, stress test, CT calcium  score, etc.:32292}  Labs:  CBC w/ Differential Lab Results  Component Value Date   WBC 4.2 01/31/2024   RBC 4.33 01/31/2024   HGB 13.0 01/31/2024   HCT 39.7 01/31/2024   PLT 155 01/31/2024   MCV 91.7 01/31/2024   MCH 30.0 01/31/2024   MCHC 32.7 01/31/2024   RDW 13.6 01/31/2024   MPV 13.5 (H) 01/31/2024   LYMPHSABS 542 (L) 02/05/2023   MONOABS 1.0 05/11/2020   BASOSABS 50 01/31/2024    Comprehensive Metabolic Panel Lab Results  Component Value Date   NA 140 01/31/2024   K 4.6 01/31/2024   CL 102 01/31/2024   CO2 24 01/31/2024   GLUCOSE 97 01/31/2024   BUN 12 01/31/2024   CREATININE 1.03 01/31/2024   CALCIUM  8.7 01/31/2024   PROT 7.2 01/31/2024   ALBUMIN 3.8 05/24/2023   AST 14 01/31/2024   ALT 10 01/31/2024   ALKPHOS 42 05/24/2023   BILITOT 1.2 01/31/2024   GFR 65.09 09/25/2020   EGFR 70 02/05/2023   GFRNONAA >60 05/24/2023   Lipid Panel  Lab Results  Component Value Date   CHOL 204 (H) 01/31/2024   HDL 86 01/31/2024   LDLCALC 97 01/31/2024   TRIG 118 01/31/2024   A1c Lab Results  Component Value Date   HGBA1C 5.6 01/31/2024    TSH Lab Results  Component Value Date   TSH 3.26 01/31/2024   PSA{PSA (Optional):32132} No results found for any visits on 08/15/24. Assessment & Plan:  No orders of the defined types were placed in this encounter.  No orders of the defined types were placed in this encounter.  Other Labs Reviewed today:    No follow-ups on file.   Annual Wellness Visit done today including the all of the following: Reviewed patient's Family Medical History Reviewed patient's  SDOH and reviewed tobacco, alcohol , and drug use.  Reviewed and updated list of patient's medical providers Assessment of cognitive impairment was done Assessed patient's functional ability Established a written schedule for health screening services Health Risk Assessent Completed and Reviewed  Discussed health benefits of physical activity, and encouraged her to engage in regular exercise appropriate for her age and condition.    I,Makayla C Reid,acting as a scribe for Tara JINNY Hailstone, MD.,have documented all relevant documentation on the behalf of Tara JINNY Hailstone, MD,as directed by  Tara JINNY Hailstone, MD while in the presence of Tara JINNY Hailstone, MD.  I, Tara JINNY Hailstone, MD, have reviewed all documentation for and agree with the above Annual Wellness Visit documentation.  Tara JINNY Hailstone, MD Internal Medicine 08/15/2024

## 2024-08-07 ENCOUNTER — Other Ambulatory Visit: Payer: Self-pay | Admitting: Internal Medicine

## 2024-08-08 ENCOUNTER — Encounter: Payer: Self-pay | Admitting: Occupational Therapy

## 2024-08-08 ENCOUNTER — Ambulatory Visit: Admitting: Occupational Therapy

## 2024-08-08 DIAGNOSIS — M25612 Stiffness of left shoulder, not elsewhere classified: Secondary | ICD-10-CM | POA: Diagnosis present

## 2024-08-08 DIAGNOSIS — M25622 Stiffness of left elbow, not elsewhere classified: Secondary | ICD-10-CM | POA: Diagnosis present

## 2024-08-08 DIAGNOSIS — M25611 Stiffness of right shoulder, not elsewhere classified: Secondary | ICD-10-CM | POA: Diagnosis present

## 2024-08-08 DIAGNOSIS — M25621 Stiffness of right elbow, not elsewhere classified: Secondary | ICD-10-CM | POA: Diagnosis present

## 2024-08-08 DIAGNOSIS — R278 Other lack of coordination: Secondary | ICD-10-CM | POA: Diagnosis present

## 2024-08-08 DIAGNOSIS — R29818 Other symptoms and signs involving the nervous system: Secondary | ICD-10-CM | POA: Diagnosis present

## 2024-08-08 NOTE — Therapy (Signed)
 OUTPATIENT OCCUPATIONAL THERAPY [facility]  Patient Name: ERLEEN EGNER MRN: [id number] DOB:Aug 05, 1956, 68 y.o., female Today's Date: 08/08/2024  PCP: Dr. Perri REFERRING PROVIDER: Dr. Perri  END OF SESSION:  OT End of Session - 08/08/24 1316     Visit Number 3    Number of Visits 25    Date for Recertification  10/12/24    Authorization Type Medicare    Authorization - Visit Number 2    Progress Note Due on Visit 10    OT Start Time 1314    OT Stop Time 1400    OT Time Calculation (min) 46 min           Past Medical History:  Diagnosis Date   Allergy    Anxiety    Arthritis    Back pain    Chronic edema    Chronic hepatitis C without hepatic coma (HCC) 10/29/2012   Chronic pain syndrome 07/01/2018   Cirrhosis (HCC) 05/10/2017   Class 2 severe obesity with serious comorbidity and body mass index (BMI) of 37.0 to 37.9 in adult 10/25/2018   Cold hands and feet    Complication of anesthesia    difficulty waking up/dizzy/lightheaded   Controlled type 2 diabetes mellitus without complication, without long-term current use of insulin  (HCC) 07/11/2018   Cough    Dry eye syndrome of both eyes 09/02/2022   Dysrhythmia, cardiac 09/28/2011   She has no records with her today. She tells me that a few years ago in [place] she had palpitations and a cardiology placed her on digoxin , she does not know what type of dysrhythmia. She has not had any palpitations recently     Environmental allergies    Eye pain    Fatigue    Gastroesophageal reflux disease 11/05/2015   H/O varicella    H/O varicose veins    Hepatic cirrhosis (HCC)    Hepatitis C    History of breast cancer 01/15/2020   History of cardiac arrhythmia 01/15/2020   History of chemotherapy 2013   left breast cancer   History of hepatitis C 01/15/2020   History of left hip replacement 08/29/2018   History of measles, mumps, or rubella    History of osteoporosis 01/15/2020   History of total right hip  replacement 08/29/2018   History of TTP (thrombotic thrombocytopenic purpura) 10/29/2012   Hx of radiation therapy 12/15/11 - 01/29/12   left breast   Hyperlipidemia    Hypokalemia 12/29/2013   Hypothyroidism    Intrinsic atopic dermatitis 01/15/2020   Irregular heart beat    under control   Joint pain    Leg cramps    Lymphedema of arm 12/15/2011   Malignant neoplasm of axillary tail of left breast in female, estrogen receptor positive (HCC) 08/12/2015   Malignant neoplasm of upper-outer quadrant of left breast in female, estrogen receptor positive (HCC) 07/17/2014   Moderate persistent asthma without complication 01/15/2020   Neuromuscular disorder (HCC)    left foot nerve damage- neuropathy    Oral candidiasis 09/02/2022   Osteopenia    Other allergic rhinitis 10/29/2012   Palpitations    Prediabetes 10/25/2018   Radiculopathy 07/2020   Rheumatoid arthritis (HCC)    Ringing in ear    Rosacea, acne 09/28/2011   Severe persistent asthma without complication (HCC) 09/02/2022   Status post bilateral hip replacements 10/29/2012   Swallowing difficulty    Swelling of both lower extremities    Thrombocytopenia, primary (HCC)    TTP (thrombotic  thrombocytopenic purpura) (HCC)    1982   Vitamin D  deficiency 10/25/2018   Past Surgical History:  Procedure Laterality Date   BREAST LUMPECTOMY  08/03/11   left lumpectomy and slnbx,T1cN0,triple pos   CATARACT EXTRACTION, BILATERAL     COLONOSCOPY  01/26/2019   COLONOSCOPY  08/12/2020   elbow pins Left    FOOT SURGERY Left    multiple   POLYPECTOMY     PORTACATH PLACEMENT  08/03/2011   Procedure: INSERTION PORT-A-CATH;  Surgeon: Deward GORMAN Curvin DOUGLAS, MD;  Location: [facility] OR;  Service: General;  Laterality: Right;   portacath removal     THORACIC DISCECTOMY Left 06/21/2023   Procedure: Laminectomy and Foraminotomy - left - Thoracic eleven-Thoracic twelve;  Surgeon: Louis Shove, MD;  Location: [facility] OR;  Service: Neurosurgery;  Laterality: Left;    TOTAL HIP ARTHROPLASTY Bilateral    UPPER GASTROINTESTINAL ENDOSCOPY     UPPER GASTROINTESTINAL ENDOSCOPY  08/12/2020   Patient Active Problem List   Diagnosis Date Noted   Thoracic radiculopathy 06/21/2023   PSVT (paroxysmal supraventricular tachycardia) (HCC) asymptomatic short runs on event monitor September 2024 06/03/2023   Mitral regurgitation 06/03/2023   Preoperative cardiovascular examination 06/03/2023   Allergy    Anxiety    Back pain    Chronic edema    Cold hands and feet    Complication of anesthesia    Environmental allergies    Eye pain    Fatigue    H/O varicella    H/O varicose veins    Hepatic cirrhosis (HCC)    Hepatitis C    History of measles, mumps, or rubella    Hyperlipidemia    Irregular heart beat    Joint pain    Leg cramps    Neuromuscular disorder (HCC)    Osteopenia    Palpitations    Rheumatoid arthritis (HCC)    Ringing in ear    Swallowing difficulty    Thrombocytopenia, primary (HCC)    TTP (thrombotic thrombocytopenic purpura) (HCC)    Severe persistent asthma without complication (HCC) 09/02/2022   Oral candidiasis 09/02/2022   Dry eye syndrome of both eyes 09/02/2022   Radiculopathy 07/2020   Moderate persistent asthma without complication 01/15/2020   History of cardiac arrhythmia 01/15/2020   History of breast cancer 01/15/2020   History of osteoporosis 01/15/2020   Intrinsic atopic dermatitis 01/15/2020   History of hepatitis C 01/15/2020   Cough 01/15/2020   Arthritis 11/10/2019   Prediabetes 10/25/2018   Class 2 severe obesity with serious comorbidity and body mass index (BMI) of 37.0 to 37.9 in adult 10/25/2018   Vitamin D  deficiency 10/25/2018   History of left hip replacement 08/29/2018   History of total right hip replacement 08/29/2018   Controlled type 2 diabetes mellitus without complication, without long-term current use of insulin  (HCC) 07/11/2018   Chronic pain syndrome 07/01/2018   Cirrhosis (HCC)  05/10/2017   Gastroesophageal reflux disease 11/05/2015   Malignant neoplasm of axillary tail of left breast in female, estrogen receptor positive (HCC) 08/12/2015   Malignant neoplasm of upper-outer quadrant of left breast in female, estrogen receptor positive (HCC) 07/17/2014   Hypokalemia 12/29/2013   Other allergic rhinitis 10/29/2012   Status post bilateral hip replacements 10/29/2012   History of TTP (thrombotic thrombocytopenic purpura) 10/29/2012   Chronic hepatitis C without hepatic coma (HCC) 10/29/2012   Hx of radiation therapy    Lymphedema of arm 12/15/2011   Rosacea, acne 09/28/2011   Dysrhythmia, cardiac 09/28/2011  Hypothyroidism 09/07/2011   History of chemotherapy 2013    ONSET DATE: 05/29/24- referral date  REFERRING DIAG:  Diagnosis  G20.A1 (ICD-10-CM) - Parkinson's disease, unspecified whether dyskinesia present, unspecified whether manifestations fluctuate (HCC)    THERAPY DIAG:  Other symptoms and signs involving the nervous system  Stiffness of left shoulder, not elsewhere classified  Stiffness of right shoulder, not elsewhere classified  Other lack of coordination  Stiffness of left elbow, not elsewhere classified  Stiffness of right elbow, not elsewhere classified  Rationale for Evaluation and Treatment: Rehabilitation  SUBJECTIVE:   SUBJECTIVE STATEMENT: Pt reports shoulder pain Pt accompanied by: significant other  PERTINENT HISTORY: Hx per chart- 68 year old female with  complicated medical history of asthma, hypothyroidism, reflux disease, diabetes, hyperlipidemia, history of breast cancer with status postsurgery and chemoradiation, anxiety, depression, history of hepatitis C in the context of blood transfusion, with a history of TTP, chronic liver disease, chronic back pain, rheumatoid arthritis, status post multiple surgeries including breast, back,  lumpectomy, cataract extractions, left foot surgery, polypectomy, bilateral total hip  arthroplasties, chronic edema, chronic pain syndrome, and severe obesity with a BMI of over 40,  MD recommends starting carbidopa / levadopa 3x daily   Dat scan results IMPRESSION: Bilateral decreased radiotracer activity within LEFT and RIGHT striatum. Findings suggest Parkinsonian syndrome pathology.  PRECAUTIONS: Fall, lymphedema, hx of breast CA, hx of back surgery 1 year ago, hx of hep C  WEIGHT BEARING RESTRICTIONS: No  PAIN:  PAIN:  Are you having pain? Yes: NPRS scale: 0-8/10 Pain location: L shoulder, hx of lymphedema Pain description: aching Aggravating factors: malpositioning Relieving factors: repositioning   FALLS: Has patient fallen in last 6 months? No  LIVING ENVIRONMENT: Lives with: lives with their spouse Lives in: House/apartment   PLOF: Needs assistance with ADLs and Needs assistance with homemaking  PATIENT GOALS: manage tremors  OBJECTIVE:  Note: Objective measures were completed at Evaluation unless otherwise noted.  HAND DOMINANCE: Right  ADLs: Overall ADLs: increased time required Transfers/ambulation related to ADLs: Eating: mod I Grooming: mod I UB Dressing: needs assist donning shirt, jacket and adjusting bra straps LB Dressing: wears special slip on shoes  Toileting: wears pad for incontinence,  Bathing:mod I  Tub Shower transfers: walk in shower and tub, mod I   IADLs:  Handwriting: 75% legible and Mild micrographia  MOBILITY STATUS: Independent, small steps, decreased pace  POSTURE COMMENTS:  rounded shoulders and forward head  ACTIVITY TOLERANCE: Activity tolerance: decreased activity tolerance, fatigues quickly  FUNCTIONAL OUTCOME MEASURES: Fastening/unfastening 3 buttons: 45.67 Physical performance test: PPT#2 (simulated eating) 16.09 & PPT#4 (donning/doffing jacket): unable  COORDINATION: 9 Hole Peg test: Right: 30.87 sec; Left: 42.91 sec Box and Blocks:  Right 44blocks, Left 39 blocks Tremors: Resting  UE ROM:   shoulder flexion: RUE 90, LUE 70, elbow extension: RUE -20, LUE -45 hx of elbow fx s/p surgery, elbow flexion 140 Pt with significant lymphedema in LUE s/p breast CA    SENSATION: Not tested    COGNITION: Overall cognitive status: to be [facility] for taks performed, further assess in a functional context  OBSERVATIONS: Bradykinesia and bilateral resting tremor left greater than right  TREATMENT DATE:  08/07/24-Supine PWR! up followed by closed chain chest press and shoulder flexion,min- v.c and facilitation  Low range seated chest press and shoulder flexion, min v.c Standing PWR! step at countertip close supervision/ minguard and min-mod v.c  Marching at countertop, min v.c, PWR! up for sit to stand Beginning coordination and PWR! hands, see pt instructions min-mod v.c   07/24/24-Supine PWR! up followed by closed chain chest press and shoulder flexion,min-mod v.c and facilitation  Pt performed shoulder abduction followed by clap overhead in supine, min-mod v.c    07/20/24- eval    PATIENT EDUCATION: Education details:inital HEP cane in supine, low range chest press and low range shoulder flexion seated, PWR! up for PWR! hands, beginning coordination Recommendation that pt considers using her walker for longer community distances so that she does not drag her feet, recommendations that pt uses caution at the gym with bending and twisiting due to back surgery hx and pt was cuationed against heavy lifting with trainer using LUE due to pain. Person educated: Patient and Spouse Education method: Explanation, demonstration, handout Education comprehension: verbalized understanding, returned demonstration  HOME EXERCISE PROGRAM: supine cane, PWR! hands, flipping and dealing cards 11/24  GOALS: Goals reviewed with patient? Yes  SHORT TERM GOALS: Target date: 08/19/24  I  with HEP Baseline: Goal status:  ongoing issued 08/07/24  2.  I with adapted strategies/ AE to maximize pt's safety and I with ADLs/IADLS. Baseline:  Goal status: ongoing 08/07/24  3.  Pt will demonstrate improved LUE functional use as evidenced by increasing box/blocks by 3 blocks Baseline: R 44, L 39 Goal status:ongoing 08/07/24  4.  Pt will demonstrate improved ease with feeding as evidenced by decreasing PPT#2 to 13 secs or less. Baseline: 16.09 sec Goal status: ongoing  08/07/24  5.  Pt will demonstrate ability to write a sentence with 100% legibility and minimimal decrease in letter size.  Goal status: INITIAL   LONG TERM GOALS: Target date: 10/12/24  Pt will demonstrate improved fine motor coordination for ADLs as evidenced by decreasing 9 hole peg test score for LUE by 3 secs. Baseline: R 30.87, L 42.91 Goal status: INITIAL  2.  Pt will demonstrate improved ease with fastening buttons as evidenced by decreasing 3 button/ unbutton score to 42 secs or less. Baseline:  Goal status: INITIAL  3.  Pt will verbalize understanding of community resources and ways to prevent PD related complications. Baseline:  Goal status: INITIAL  4.  Pt will demonstrate ability to retrieve a lightweight object at 100 shoulder flexion with -15 elbow extension with RUE.  Goal status: INITIAL  5.  Pt will demonstrate ability to retrieve a lightweight object at 80 shoulder flexion with LUE Goal status: INITIAL  ASSESSMENT:  CLINICAL IMPRESSION: Patient is progressing towards goals. She benefits from reinforcement of big movement strategies and strategeis to minimize shoulder pain and risk for injury. Pt practiced amb with walker at end of session and was noted to have improved posture and larger steps.  PERFORMANCE DEFICITS: in functional skills including ADLs, IADLs, coordination, dexterity, edema, ROM, strength, flexibility, Fine motor control, Gross motor control, mobility, balance,  endurance, decreased knowledge of precautions, and decreased knowledge of use of DME,  and psychosocial skills including coping strategies, environmental adaptation, habits, interpersonal interactions, and routines and behaviors.   IMPAIRMENTS: are limiting patient from ADLs, IADLs, rest and sleep, play, leisure, and social participation.   COMORBIDITIES:  may have co-morbidities  that affects occupational performance. Patient will benefit from  skilled OT to address above impairments and improve overall function.  MODIFICATION OR ASSISTANCE TO COMPLETE EVALUATION: No modification of tasks or assist necessary to complete an evaluation.  OT OCCUPATIONAL PROFILE AND HISTORY: Detailed assessment: Review of records and additional review of physical, cognitive, psychosocial history related to current functional performance.  CLINICAL DECISION MAKING: Moderate - several treatment options, min-mod task modification necessary  REHAB POTENTIAL: Good  EVALUATION COMPLEXITY: Low    PLAN:  OT FREQUENCY: 2x/week plus eval  OT DURATION: 12 weeks  PLANNED INTERVENTIONS: 97168 OT Re-evaluation, 97535 self care/ADL training, 02889 therapeutic exercise, 97530 therapeutic activity, 97112 neuromuscular re-education, 97140 manual therapy, 97113 aquatic therapy, 97035 ultrasound, 97018 paraffin, 02989 moist heat, 97010 cryotherapy, 97750 Physical Performance Testing, 02239 Orthotic Initial, 97763 Orthotic/Prosthetic subsequent, passive range of motion, balance training, functional mobility training, visual/perceptual remediation/compensation, psychosocial skills training, energy conservation, coping strategies training, patient/family education, and DME and/or AE instructions  RECOMMENDED OTHER SERVICES: Pt may benefit from PT in the future for lymphadema  CONSULTED AND AGREED WITH PLAN OF CARE: Patient and family member/caregiver  PLAN FOR NEXT SESSION: continue to address coordiantion, ADL  strategies   Jaislyn Blinn, OT 08/08/2024, 1:17 PM

## 2024-08-08 NOTE — Patient Instructions (Signed)
 PWR! Hands  With arms stretched out in front of you (elbows straight), perform the following: PWR! Hands: Push hands out BIG. Elbows straight, wrists up, fingers open and spread apart BIG. (Can also perform by pushing down on table, chair, knees. Push above head, out to the side, behind you, in front of you.) Coordination Exercises  Perform the following exercises for 10 minutes 1 times per day. Perform with both hand(s). Perform using big movements.  Flipping Cards: Place deck of cards on the table. Flip cards over by opening your hand big to grasp and then turn your palm up big. Deal cards: Hold 1/2 or whole deck in your hand. Use thumb to push card off top of deck with one big push. Pick up coins and stack one at a time: Pick up with big, intentional movements. Do not drag coin to the edge. (5-10 in a stack) Pick up 5-10 coins one at a time and hold in palm. Then, move coins from palm to fingertips one at time and place in coin bank/container. Perform Flicks/hand stretches (PWR! Hands): Close hands then flick out your fingers with focus on opening hands, pulling wrists back, and extending elbows like you are pushing.  Perform at least 10 repetitions 1x/day, but perform PWR! hands throughout the day when you are having trouble using your hands (picking up/manipulating small objects, writing, eating, typing, sewing, buttoning, etc.).

## 2024-08-11 ENCOUNTER — Other Ambulatory Visit: Payer: Self-pay

## 2024-08-11 DIAGNOSIS — E039 Hypothyroidism, unspecified: Secondary | ICD-10-CM

## 2024-08-11 DIAGNOSIS — J4520 Mild intermittent asthma, uncomplicated: Secondary | ICD-10-CM

## 2024-08-11 DIAGNOSIS — E038 Other specified hypothyroidism: Secondary | ICD-10-CM

## 2024-08-11 DIAGNOSIS — E1169 Type 2 diabetes mellitus with other specified complication: Secondary | ICD-10-CM

## 2024-08-11 DIAGNOSIS — Z0000 Encounter for general adult medical examination without abnormal findings: Secondary | ICD-10-CM

## 2024-08-12 LAB — COMPREHENSIVE METABOLIC PANEL WITH GFR
AG Ratio: 1.5 (calc) (ref 1.0–2.5)
ALT: 9 U/L (ref 6–29)
AST: 15 U/L (ref 10–35)
Albumin: 4.3 g/dL (ref 3.6–5.1)
Alkaline phosphatase (APISO): 48 U/L (ref 37–153)
BUN/Creatinine Ratio: 13 (calc) (ref 6–22)
BUN: 15 mg/dL (ref 7–25)
CO2: 28 mmol/L (ref 20–32)
Calcium: 8.1 mg/dL — ABNORMAL LOW (ref 8.6–10.4)
Chloride: 101 mmol/L (ref 98–110)
Creat: 1.17 mg/dL — ABNORMAL HIGH (ref 0.50–1.05)
Globulin: 2.9 g/dL (ref 1.9–3.7)
Glucose, Bld: 102 mg/dL — ABNORMAL HIGH (ref 65–99)
Potassium: 4.8 mmol/L (ref 3.5–5.3)
Sodium: 140 mmol/L (ref 135–146)
Total Bilirubin: 1 mg/dL (ref 0.2–1.2)
Total Protein: 7.2 g/dL (ref 6.1–8.1)
eGFR: 51 mL/min/1.73m2 — ABNORMAL LOW (ref >=60)

## 2024-08-12 LAB — CBC WITH DIFFERENTIAL/PLATELET
Absolute Lymphocytes: 504 {cells}/uL — ABNORMAL LOW (ref 850–3900)
Absolute Monocytes: 464 {cells}/uL (ref 200–950)
Basophils Absolute: 40 {cells}/uL (ref 0–200)
Basophils Relative: 1 %
Eosinophils Absolute: 0 {cells}/uL — ABNORMAL LOW (ref 15–500)
Eosinophils Relative: 0 %
HCT: 39.6 % (ref 35.9–46.0)
Hemoglobin: 12.8 g/dL (ref 11.7–15.5)
MCH: 30 pg (ref 27.0–33.0)
MCHC: 32.3 g/dL (ref 31.6–35.4)
MCV: 93 fL (ref 81.4–101.7)
MPV: 12.1 fL (ref 7.5–12.5)
Monocytes Relative: 11.6 %
Neutro Abs: 2992 {cells}/uL (ref 1500–7800)
Neutrophils Relative %: 74.8 %
Platelets: 147 Thousand/uL (ref 140–400)
RBC: 4.26 Million/uL (ref 3.80–5.10)
RDW: 13.6 % (ref 11.0–15.0)
Total Lymphocyte: 12.6 %
WBC: 4 Thousand/uL (ref 3.8–10.8)

## 2024-08-12 LAB — LIPID PANEL
Cholesterol: 165 mg/dL (ref <200)
HDL: 75 mg/dL (ref >=50)
LDL Cholesterol (Calc): 69 mg/dL
Non-HDL Cholesterol (Calc): 90 mg/dL (ref <130)
Total CHOL/HDL Ratio: 2.2 (calc) (ref <5.0)
Triglycerides: 119 mg/dL (ref <150)

## 2024-08-12 LAB — HEMOGLOBIN A1C
Hgb A1c MFr Bld: 5.7 % — ABNORMAL HIGH (ref <5.7)
Mean Plasma Glucose: 117 mg/dL
eAG (mmol/L): 6.5 mmol/L

## 2024-08-12 LAB — TSH: TSH: 2.97 m[IU]/L (ref 0.40–4.50)

## 2024-08-14 ENCOUNTER — Ambulatory Visit: Admitting: Occupational Therapy

## 2024-08-14 ENCOUNTER — Encounter: Payer: Self-pay | Admitting: Occupational Therapy

## 2024-08-14 DIAGNOSIS — R29818 Other symptoms and signs involving the nervous system: Secondary | ICD-10-CM | POA: Diagnosis not present

## 2024-08-14 DIAGNOSIS — M25621 Stiffness of right elbow, not elsewhere classified: Secondary | ICD-10-CM

## 2024-08-14 DIAGNOSIS — M25612 Stiffness of left shoulder, not elsewhere classified: Secondary | ICD-10-CM

## 2024-08-14 DIAGNOSIS — R278 Other lack of coordination: Secondary | ICD-10-CM

## 2024-08-14 DIAGNOSIS — M25622 Stiffness of left elbow, not elsewhere classified: Secondary | ICD-10-CM

## 2024-08-14 DIAGNOSIS — M25611 Stiffness of right shoulder, not elsewhere classified: Secondary | ICD-10-CM

## 2024-08-14 NOTE — Therapy (Signed)
 OUTPATIENT OCCUPATIONAL THERAPY [facility]  Patient Name: TASHEEMA PERRONE MRN: [id number] DOB:[birth year], 68 y.o., female Today's Date: 08/14/2024  PCP: Dr. Perri REFERRING PROVIDER: Dr. Perri  END OF SESSION:  OT End of Session - 08/14/24 1655     Visit Number 4    Number of Visits 25    Date for Recertification  10/12/24    Authorization Type Medicare    Authorization - Visit Number 4    Authorization - Number of Visits 10    Progress Note Due on Visit 10    OT Start Time 1531    OT Stop Time 1615    OT Time Calculation (min) 44 min    Activity Tolerance Patient tolerated treatment well    Behavior During Therapy [facility] for tasks assessed/performed            Past Medical History:  Diagnosis Date   Allergy    Anxiety    Arthritis    Back pain    Chronic edema    Chronic hepatitis C without hepatic coma (HCC) 10/29/2012   Chronic pain syndrome 07/01/2018   Cirrhosis (HCC) 05/10/2017   Class 2 severe obesity with serious comorbidity and body mass index (BMI) of 37.0 to 37.9 in adult 10/25/2018   Cold hands and feet    Complication of anesthesia    difficulty waking up/dizzy/lightheaded   Controlled type 2 diabetes mellitus without complication, without long-term current use of insulin  (HCC) 07/11/2018   Cough    Dry eye syndrome of both eyes 09/02/2022   Dysrhythmia, cardiac 09/28/2011   She has no records with her today. She tells me that a few years ago in [place] she had palpitations and a cardiology placed her on digoxin , she does not know what type of dysrhythmia. She has not had any palpitations recently     Environmental allergies    Eye pain    Fatigue    Gastroesophageal reflux disease 11/05/2015   H/O varicella    H/O varicose veins    Hepatic cirrhosis (HCC)    Hepatitis C    History of breast cancer 01/15/2020   History of cardiac arrhythmia 01/15/2020   History of chemotherapy 2013   left breast cancer   History of hepatitis C 01/15/2020    History of left hip replacement 08/29/2018   History of measles, mumps, or rubella    History of osteoporosis 01/15/2020   History of total right hip replacement 08/29/2018   History of TTP (thrombotic thrombocytopenic purpura) 10/29/2012   Hx of radiation therapy 12/15/11 - 01/29/12   left breast   Hyperlipidemia    Hypokalemia 12/29/2013   Hypothyroidism    Intrinsic atopic dermatitis 01/15/2020   Irregular heart beat    under control   Joint pain    Leg cramps    Lymphedema of arm 12/15/2011   Malignant neoplasm of axillary tail of left breast in female, estrogen receptor positive (HCC) 08/12/2015   Malignant neoplasm of upper-outer quadrant of left breast in female, estrogen receptor positive (HCC) 07/17/2014   Moderate persistent asthma without complication 01/15/2020   Neuromuscular disorder (HCC)    left foot nerve damage- neuropathy    Oral candidiasis 09/02/2022   Osteopenia    Other allergic rhinitis 10/29/2012   Palpitations    Prediabetes 10/25/2018   Radiculopathy 07/2020   Rheumatoid arthritis (HCC)    Ringing in ear    Rosacea, acne 09/28/2011   Severe persistent asthma without complication (HCC) 09/02/2022  Status post bilateral hip replacements 10/29/2012   Swallowing difficulty    Swelling of both lower extremities    Thrombocytopenia, primary (HCC)    TTP (thrombotic thrombocytopenic purpura) (HCC)    1982   Vitamin D  deficiency 10/25/2018   Past Surgical History:  Procedure Laterality Date   BREAST LUMPECTOMY  08/03/11   left lumpectomy and slnbx,T1cN0,triple pos   CATARACT EXTRACTION, BILATERAL     COLONOSCOPY  01/26/2019   COLONOSCOPY  08/12/2020   elbow pins Left    FOOT SURGERY Left    multiple   POLYPECTOMY     PORTACATH PLACEMENT  08/03/2011   Procedure: INSERTION PORT-A-CATH;  Surgeon: Deward GORMAN Curvin DOUGLAS, MD;  Location: [facility] OR;  Service: General;  Laterality: Right;   portacath removal     THORACIC DISCECTOMY Left 06/21/2023   Procedure:  Laminectomy and Foraminotomy - left - Thoracic eleven-Thoracic twelve;  Surgeon: Louis Shove, MD;  Location: [facility] OR;  Service: Neurosurgery;  Laterality: Left;   TOTAL HIP ARTHROPLASTY Bilateral    UPPER GASTROINTESTINAL ENDOSCOPY     UPPER GASTROINTESTINAL ENDOSCOPY  08/12/2020   Patient Active Problem List   Diagnosis Date Noted   Thoracic radiculopathy 06/21/2023   PSVT (paroxysmal supraventricular tachycardia) (HCC) asymptomatic short runs on event monitor September 2024 06/03/2023   Mitral regurgitation 06/03/2023   Preoperative cardiovascular examination 06/03/2023   Allergy    Anxiety    Back pain    Chronic edema    Cold hands and feet    Complication of anesthesia    Environmental allergies    Eye pain    Fatigue    H/O varicella    H/O varicose veins    Hepatic cirrhosis (HCC)    Hepatitis C    History of measles, mumps, or rubella    Hyperlipidemia    Irregular heart beat    Joint pain    Leg cramps    Neuromuscular disorder (HCC)    Osteopenia    Palpitations    Rheumatoid arthritis (HCC)    Ringing in ear    Swallowing difficulty    Thrombocytopenia, primary (HCC)    TTP (thrombotic thrombocytopenic purpura) (HCC)    Severe persistent asthma without complication (HCC) 09/02/2022   Oral candidiasis 09/02/2022   Dry eye syndrome of both eyes 09/02/2022   Radiculopathy 07/2020   Moderate persistent asthma without complication 01/15/2020   History of cardiac arrhythmia 01/15/2020   History of breast cancer 01/15/2020   History of osteoporosis 01/15/2020   Intrinsic atopic dermatitis 01/15/2020   History of hepatitis C 01/15/2020   Cough 01/15/2020   Arthritis 11/10/2019   Prediabetes 10/25/2018   Class 2 severe obesity with serious comorbidity and body mass index (BMI) of 37.0 to 37.9 in adult 10/25/2018   Vitamin D  deficiency 10/25/2018   History of left hip replacement 08/29/2018   History of total right hip replacement 08/29/2018   Controlled type 2  diabetes mellitus without complication, without long-term current use of insulin  (HCC) 07/11/2018   Chronic pain syndrome 07/01/2018   Cirrhosis (HCC) 05/10/2017   Gastroesophageal reflux disease 11/05/2015   Malignant neoplasm of axillary tail of left breast in female, estrogen receptor positive (HCC) 08/12/2015   Malignant neoplasm of upper-outer quadrant of left breast in female, estrogen receptor positive (HCC) 07/17/2014   Hypokalemia 12/29/2013   Other allergic rhinitis 10/29/2012   Status post bilateral hip replacements 10/29/2012   History of TTP (thrombotic thrombocytopenic purpura) 10/29/2012   Chronic hepatitis C without  hepatic coma (HCC) 10/29/2012   Hx of radiation therapy    Lymphedema of arm 12/15/2011   Rosacea, acne 09/28/2011   Dysrhythmia, cardiac 09/28/2011   Hypothyroidism 09/07/2011   History of chemotherapy 2013    ONSET DATE: 05/29/24- referral date  REFERRING DIAG:  Diagnosis  G20.A1 (ICD-10-CM) - Parkinson's disease, unspecified whether dyskinesia present, unspecified whether manifestations fluctuate (HCC)    THERAPY DIAG:  Other symptoms and signs involving the nervous system  Stiffness of left shoulder, not elsewhere classified  Stiffness of right shoulder, not elsewhere classified  Other lack of coordination  Stiffness of left elbow, not elsewhere classified  Stiffness of right elbow, not elsewhere classified  Rationale for Evaluation and Treatment: Rehabilitation  SUBJECTIVE:   SUBJECTIVE STATEMENT: Pt requests copies of exercises to take to trainer Pt accompanied by: significant other  PERTINENT HISTORY: Hx per chart- 68 year old female with  complicated medical history of asthma, hypothyroidism, reflux disease, diabetes, hyperlipidemia, history of breast cancer with status postsurgery and chemoradiation, anxiety, depression, history of hepatitis C in the context of blood transfusion, with a history of TTP, chronic liver disease, chronic  back pain, rheumatoid arthritis, status post multiple surgeries including breast, back,  lumpectomy, cataract extractions, left foot surgery, polypectomy, bilateral total hip arthroplasties, chronic edema, chronic pain syndrome, and severe obesity with a BMI of over 40,  MD recommends starting carbidopa / levadopa 3x daily   Dat scan results IMPRESSION: Bilateral decreased radiotracer activity within LEFT and RIGHT striatum. Findings suggest Parkinsonian syndrome pathology.  PRECAUTIONS: Fall, lymphedema, hx of breast CA, hx of back surgery 1 year ago, hx of hep C  WEIGHT BEARING RESTRICTIONS: No  PAIN:  PAIN:  Are you having pain? Yes: NPRS scale: 4/10 Pain location: L shoulder, hx of lymphedema Pain description: aching Aggravating factors: malpositioning Relieving factors: repositioning   FALLS: Has patient fallen in last 6 months? No  LIVING ENVIRONMENT: Lives with: lives with their spouse Lives in: House/apartment   PLOF: Needs assistance with ADLs and Needs assistance with homemaking  PATIENT GOALS: manage tremors  OBJECTIVE:  Note: Objective measures were completed at Evaluation unless otherwise noted.  HAND DOMINANCE: Right  ADLs: Overall ADLs: increased time required Transfers/ambulation related to ADLs: Eating: mod I Grooming: mod I UB Dressing: needs assist donning shirt, jacket and adjusting bra straps LB Dressing: wears special slip on shoes  Toileting: wears pad for incontinence,  Bathing:mod I  Tub Shower transfers: walk in shower and tub, mod I   IADLs:  Handwriting: 75% legible and Mild micrographia  MOBILITY STATUS: Independent, small steps, decreased pace  POSTURE COMMENTS:  rounded shoulders and forward head  ACTIVITY TOLERANCE: Activity tolerance: decreased activity tolerance, fatigues quickly  FUNCTIONAL OUTCOME MEASURES: Fastening/unfastening 3 buttons: 45.67 Physical performance test: PPT#2 (simulated eating) 16.09 & PPT#4  (donning/doffing jacket): unable  COORDINATION: 9 Hole Peg test: Right: 30.87 sec; Left: 42.91 sec Box and Blocks:  Right 44blocks, Left 39 blocks Tremors: Resting  UE ROM:  shoulder flexion: RUE 90, LUE 70, elbow extension: RUE -20, LUE -45 hx of elbow fx s/p surgery, elbow flexion 140 Pt with significant lymphedema in LUE s/p breast CA    SENSATION: Not tested    COGNITION: Overall cognitive status: to be [facility] for taks performed, further assess in a functional context  OBSERVATIONS: Bradykinesia and bilateral resting tremor left greater than right  TREATMENT DATE: 08/14/24 PWr1 up supine, closed cahin chest press, min v.c Rolling like a log with PWr1 twist to sit up. PWR! up, rock and step in seated mod v.c  rest break in between exercises. Donning vest with big movments min v.c / assist. Pt arrived with her walker today and she demonstrates improved mobility. Pt to show her trainer copies of exercises.  08/07/24-Supine PWR! up followed by closed chain chest press and shoulder flexion,min- v.c and facilitation  Low range seated chest press and shoulder flexion, min v.c Standing PWR! step at countertip close supervision/ minguard and min-mod v.c  Marching at countertop, min v.c, PWR! up for sit to stand Beginning coordination and PWR! hands, see pt instructions min-mod v.c   07/24/24-Supine PWR! up followed by closed chain chest press and shoulder flexion,min-mod v.c and facilitation  Pt performed shoulder abduction followed by clap overhead in supine, min-mod v.c    07/20/24- eval    PATIENT EDUCATION: Education details:inital HEP cane in supine, low range chest press and low range shoulder flexion seated, PWR! up, rock and step in seated with modificicaitons prnR Person educated: Patient and Spouse Education method: Explanation, demonstration,  handout Education comprehension: verbalized understanding, returned demonstration  HOME EXERCISE PROGRAM: supine cane, PWR! hands, flipping and dealing cards 11/24  GOALS: Goals reviewed with patient? Yes  SHORT TERM GOALS: Target date: 08/19/24  I with HEP Baseline: Goal status:  ongoing 08/14/24 2.  I with adapted strategies/ AE to maximize pt's safety and I with ADLs/IADLS. Baseline:  Goal status: ongoing 08/14/24  3.  Pt will demonstrate improved LUE functional use as evidenced by increasing box/blocks by 3 blocks Baseline: R 44, L 39 Goal status:ongoing 08/07/24  4.  Pt will demonstrate improved ease with feeding as evidenced by decreasing PPT#2 to 13 secs or less. Baseline: 16.09 sec Goal status: ongoing  08/07/24  5.  Pt will demonstrate ability to write a sentence with 100% legibility and minimimal decrease in letter size.  Goal status: INITIAL   LONG TERM GOALS: Target date: 10/12/24  Pt will demonstrate improved fine motor coordination for ADLs as evidenced by decreasing 9 hole peg test score for LUE by 3 secs. Baseline: R 30.87, L 42.91 Goal status: INITIAL  2.  Pt will demonstrate improved ease with fastening buttons as evidenced by decreasing 3 button/ unbutton score to 42 secs or less. Baseline:  Goal status: INITIAL  3.  Pt will verbalize understanding of community resources and ways to prevent PD related complications. Baseline:  Goal status: INITIAL  4.  Pt will demonstrate ability to retrieve a lightweight object at 100 shoulder flexion with -15 elbow extension with RUE.  Goal status: INITIAL  5.  Pt will demonstrate ability to retrieve a lightweight object at 80 shoulder flexion with LUE Goal status: INITIAL  ASSESSMENT:  CLINICAL IMPRESSION: Patient is progressing towards goals. Pt demonstrates improved functional mobility and posture at end of session today.PERFORMANCE DEFICITS: in functional skills including ADLs, IADLs, coordination,  dexterity, edema, ROM, strength, flexibility, Fine motor control, Gross motor control, mobility, balance, endurance, decreased knowledge of precautions, and decreased knowledge of use of DME,  and psychosocial skills including coping strategies, environmental adaptation, habits, interpersonal interactions, and routines and behaviors.   IMPAIRMENTS: are limiting patient from ADLs, IADLs, rest and sleep, play, leisure, and social participation.   COMORBIDITIES:  may have co-morbidities  that affects occupational performance. Patient will benefit from skilled OT to address above impairments and improve overall function.  MODIFICATION OR  ASSISTANCE TO COMPLETE EVALUATION: No modification of tasks or assist necessary to complete an evaluation.  OT OCCUPATIONAL PROFILE AND HISTORY: Detailed assessment: Review of records and additional review of physical, cognitive, psychosocial history related to current functional performance.  CLINICAL DECISION MAKING: Moderate - several treatment options, min-mod task modification necessary  REHAB POTENTIAL: Good  EVALUATION COMPLEXITY: Low    PLAN:  OT FREQUENCY: 2x/week plus eval  OT DURATION: 12 weeks  PLANNED INTERVENTIONS: 97168 OT Re-evaluation, 97535 self care/ADL training, 02889 therapeutic exercise, 97530 therapeutic activity, 97112 neuromuscular re-education, 97140 manual therapy, 97113 aquatic therapy, 97035 ultrasound, 97018 paraffin, 02989 moist heat, 97010 cryotherapy, 97750 Physical Performance Testing, 02239 Orthotic Initial, [id number] Orthotic/Prosthetic subsequent, passive range of motion, balance training, functional mobility training, visual/perceptual remediation/compensation, psychosocial skills training, energy conservation, coping strategies training, patient/family education, and DME and/or AE instructions  RECOMMENDED OTHER SERVICES: Pt may benefit from PT in the future for lymphadema  CONSULTED AND AGREED WITH PLAN OF CARE: Patient and  family member/caregiver  PLAN FOR NEXT SESSION:review HEP   Tyronn Golda, OT 08/14/2024, 4:59 PM

## 2024-08-14 NOTE — Patient Instructions (Addendum)
° ° ° ° °  Laying on your back squeeze your shoulder blades down towards the bed, hold 3 secs, 10 reps 1x day       ELBOW: Extension / Chest Press holding paper towel roll    Lie on back with knees bent. Straighten elbows to raise frame. Hold _5__ seconds.  5-10___ reps per set, _1__ sets per day, 7___ days per week Use steering wheel or hula hoop instead of frame.   Shoulder: Flexion (Supine)    With hands shoulder width apart, slowly lower dowel to floor behind head. Do not let elbows bend. Keep back flat. Hold __5__ seconds. Repeat __5-10__ times. Do __1__ sessions per day. CAUTION: Stretch slowly and gently.                Copyright  VHI. All rights reserved.     PWR! Hands   With arms stretched out in front of you (elbows straight), perform the following: PWR! Hands: Push hands out BIG. Elbows straight, wrists up, fingers open and spread apart BIG. (Can also perform by pushing down on table, chair, knees. Push above head, out to the side, behind you, in front of you.) Coordination Exercises   Perform the following exercises for 10 minutes 1 times per day. Perform with both hand(s). Perform using big movements.   Flipping Cards: Place deck of cards on the table. Flip cards over by opening your hand big to grasp and then turn your palm up big. Deal cards: Hold 1/2 or whole deck in your hand. Use thumb to push card off top of deck with one big push. Pick up coins and stack one at a time: Pick up with big, intentional movements. Do not drag coin to the edge. (5-10 in a stack) Pick up 5-10 coins one at a time and hold in palm. Then, move coins from palm to fingertips one at time and place in coin bank/container. Perform Flicks/hand stretches (PWR! Hands): Close hands then flick out your fingers with focus on opening hands, pulling wrists back, and extending elbows like you are pushing.   Perform at least 10 repetitions 1x/day, but perform PWR! hands throughout  the day when you are having trouble using your hands (picking up/manipulating small objects, writing, eating, typing, sewing, buttoning, etc.).

## 2024-08-15 ENCOUNTER — Ambulatory Visit: Admitting: Occupational Therapy

## 2024-08-15 ENCOUNTER — Ambulatory Visit: Payer: Self-pay | Admitting: Internal Medicine

## 2024-08-17 ENCOUNTER — Ambulatory Visit: Admitting: Internal Medicine

## 2024-08-17 ENCOUNTER — Encounter: Payer: Self-pay | Admitting: Internal Medicine

## 2024-08-17 ENCOUNTER — Ambulatory Visit: Admitting: Occupational Therapy

## 2024-08-17 VITALS — BP 120/80 | HR 94 | Ht 67.0 in | Wt 274.0 lb

## 2024-08-17 DIAGNOSIS — G20A1 Parkinson's disease without dyskinesia, without mention of fluctuations: Secondary | ICD-10-CM

## 2024-08-17 DIAGNOSIS — R000 Tachycardia, unspecified: Secondary | ICD-10-CM

## 2024-08-17 DIAGNOSIS — J4550 Severe persistent asthma, uncomplicated: Secondary | ICD-10-CM

## 2024-08-17 DIAGNOSIS — Z853 Personal history of malignant neoplasm of breast: Secondary | ICD-10-CM

## 2024-08-17 DIAGNOSIS — Z0000 Encounter for general adult medical examination without abnormal findings: Secondary | ICD-10-CM

## 2024-08-17 DIAGNOSIS — Z6841 Body Mass Index (BMI) 40.0 and over, adult: Secondary | ICD-10-CM

## 2024-08-17 DIAGNOSIS — K219 Gastro-esophageal reflux disease without esophagitis: Secondary | ICD-10-CM

## 2024-08-17 DIAGNOSIS — Z7984 Long term (current) use of oral hypoglycemic drugs: Secondary | ICD-10-CM | POA: Diagnosis not present

## 2024-08-17 DIAGNOSIS — E785 Hyperlipidemia, unspecified: Secondary | ICD-10-CM

## 2024-08-17 DIAGNOSIS — J4520 Mild intermittent asthma, uncomplicated: Secondary | ICD-10-CM

## 2024-08-17 DIAGNOSIS — G20C Parkinsonism, unspecified: Secondary | ICD-10-CM

## 2024-08-17 DIAGNOSIS — Z8619 Personal history of other infectious and parasitic diseases: Secondary | ICD-10-CM

## 2024-08-17 DIAGNOSIS — E039 Hypothyroidism, unspecified: Secondary | ICD-10-CM | POA: Diagnosis not present

## 2024-08-17 DIAGNOSIS — Z96643 Presence of artificial hip joint, bilateral: Secondary | ICD-10-CM

## 2024-08-17 DIAGNOSIS — E119 Type 2 diabetes mellitus without complications: Secondary | ICD-10-CM | POA: Diagnosis not present

## 2024-08-17 DIAGNOSIS — F419 Anxiety disorder, unspecified: Secondary | ICD-10-CM

## 2024-08-17 DIAGNOSIS — E1169 Type 2 diabetes mellitus with other specified complication: Secondary | ICD-10-CM

## 2024-08-17 NOTE — Progress Notes (Signed)
 "  Annual Wellness Visit   Patient Care Team: Garik Diamant, Tara PARAS, MD as PCP - General (Internal Medicine) Marcey Elspeth PARAS, MD as Consulting Physician (Ophthalmology)  Visit Date: 08/17/2024   Chief Complaint  Patient presents with   Annual Exam   Subjective:  Patient: Tara [name], Female DOB: 09-24-1955, 68 y.o. MRN: [id number] Vitals:   08/17/24 1214  BP: 120/80   Tara [name] is a 68 y.o. Female who presents today for her Annual Wellness Visit. Patient has Hypothyroidism; Rosacea, acne; Dysrhythmia, cardiac; Lymphedema of arm; Hx of radiation therapy; Other allergic rhinitis; Status post bilateral hip replacements; History of TTP (thrombotic thrombocytopenic purpura); Chronic hepatitis C without hepatic coma (HCC); Hypokalemia; Malignant neoplasm of upper-outer quadrant of left breast in female, estrogen receptor positive (HCC); Malignant neoplasm of axillary tail of left breast in female, estrogen receptor positive (HCC); Cirrhosis (HCC); Gastroesophageal reflux disease; Controlled type 2 diabetes mellitus without complication, without long-term current use of insulin  (HCC); History of left hip replacement; History of total right hip replacement; Prediabetes; Class 2 severe obesity with serious comorbidity and body mass index (BMI) of 37.0 to 37.9 in adult; Vitamin D  deficiency; Arthritis; Moderate persistent asthma without complication; History of cardiac arrhythmia; History of breast cancer; History of osteoporosis; Intrinsic atopic dermatitis; History of hepatitis C; Cough; Severe persistent asthma without complication (HCC); Oral candidiasis; Dry eye syndrome of both eyes; Allergy; Anxiety; Back pain; Chronic edema; Cold hands and feet; Complication of anesthesia; Environmental allergies; Eye pain; Fatigue; H/O varicella; H/O varicose veins; Hepatic cirrhosis (HCC); Hepatitis C; History of chemotherapy; History of measles, mumps, or rubella; Hyperlipidemia; Irregular heart beat; Joint pain;  Leg cramps; Neuromuscular disorder (HCC); Osteopenia; Palpitations; Radiculopathy; Rheumatoid arthritis (HCC); Ringing in ear; Swallowing difficulty; Thrombocytopenia, primary (HCC); TTP (thrombotic thrombocytopenic purpura) (HCC); Chronic pain syndrome; PSVT (paroxysmal supraventricular tachycardia) (HCC) asymptomatic short runs on event monitor September 2024; Mitral regurgitation; Preoperative cardiovascular examination; and Thoracic radiculopathy on their problem list.  Status post left laminectomy and foraminotomy T11-T12 by Dr. Victory Gunnels in October 2024.   History of bilateral total hip arthroplasty. She has occasional pain in hips in the morning and plans to schedule a consult with orthopedics.   She sees Allergist for moderate persistent asthma and allergic rhinitis treated with Ventolin  inhaler, Advair  inhaler, montelukast , dupilumab  300 mg every 14 days, benralizumab .  She has a history of atopic dermatitis.  History of oral thrush and has been treated with nystatin  orally.   History of Primary Parkinsonism treated with Sinomet IR 25-100 3 times daily. Followed by Dr. Buck, neurologist.    05/12/2024 Brain Datascan Bilateral decreased radiotracer activity within LEFT and RIGHT striatum. Findings suggest Parkinsonian syndrome pathology.    History of dysrhythmia treated with Metoprolol  succinate 12.5 mg twice daily as needed for tachycardia.Pulse today is 94. Blood pressure is normal at 120/80   History of GE Reflux treated with famotidine  20 mg twice daily, omeprazole  20 mg twice daily before meals.   History of hyperlipidemia treated with rosuvastatin  5 mg daily.  08/11/2024 Lipid panel normal.   History of displaced fracture of proximal phalanx right great toe noted April 2022 treated by podiatrist.  History of left thoracic radiculopathy which has improved.  Dr. Letha suggested a nerve block at T11 but I do not think patient had that procedure.  History of Hepatitis C treated  with Harvoni , gastroparesis with delayed gastric emptying.  Patient thinks she contracted hepatitis C by blood transfusion for TTP while living in [place].  Liver functions are normal.  History of breast cancer while living in [place]  in 2012.  Had a 10 mm suspicious abnormality on left mammogram and biopsy showing invasive ductal carcinoma grade 3 estrogen receptor positive/progesterone receptor positive/HER2 positive.  She had left lumpectomy and sentinel node sampling December 2012.  Sentinel nodes negative for tumor.  She has had 4 cycles of adjuvant chemotherapy.  Chemotherapy was started in [place] in 2013 after she moved here from [place] .  She also received radiation treatment here.  Developed cellulitis in her left breast at that time and was treated with Keflex .  No longer followed at cancer center.  History of Diabetes mellitus, Type II; 12/12 HgbA1c 5.7%.   Longstanding history of obesity and did try healthy weight clinic for a while but was not really committed.  History of TTP when she was 68 years of age and did not have splenectomy.  Patient says she had recurrence at age 41 while being treated with interferon for hepatitis C.  History of bilateral hip replacement, right hip arthroplasty 2005 and left hip arthroplasty 2007.  History of hypothyroidism treated with Levothyroxine  88 mcg daily.   History of depression treated with Celexa   History of eczema left foot  History of dependent edema.  History of left lower lobe pneumonia January 2017 treated as an outpatient.  In July 2017 she was admitted to the hospital with hypokalemia thought to be secondary to diarrhea.  Presumably had viral gastroenteritis with more than 10 bowel movements daily.  Was treated with IV fluids and IV potassium.  Subsequently had a fall at home while she was sick with gastroenteritis and suffered a nondisplaced right posteriolateral eighth rib fracture.  In December 2016 she had CT of the chest  with contrast for complaint of back and chest pain for 18 months study showed no evidence of metastatic disease.  History of vitamin D  deficiency and I think she should continue high-dose vitamin D  indefinitely.   Labs 08/11/2024 Absolute Lymphocytes 504, absolute eosinophils 0, Blood glucose 102, Creatinine 1.17, eGFR 51, Calcium  8.1, HgbA1c 5.7%, Otherwise WNL   01/31/2024 Mammogram No mammographic evidence of malignancy. Repeat in one year.   08/12/2020 Colonoscopy Diverticulosis in the sigmoid colon and in the descending colon. One 3 mm possible polyp in the descending colon. Resected and retrieved. One 3 mm polyp at the hepatic flexure. Resected and retrieved. Pathology found to be inflammatory polyp not associated with colon cancer. There was a small amount of residual tubular adenoma that was not removed. The examination was otherwise normal on direct and retroflexion views. Repeat in 3 years.     Health Maintenance  Topic Date Due   Colonoscopy  08/13/2023   FOOT EXAM  08/08/2024   Diabetic kidney evaluation - Urine ACR  01/30/2025   Mammogram  01/30/2025   Medicare Annual Wellness (AWV)  02/08/2025   HEMOGLOBIN A1C  02/09/2025   OPHTHALMOLOGY EXAM  02/15/2025   Diabetic kidney evaluation - eGFR measurement  08/11/2025   DTaP/Tdap/Td (3 - Td or Tdap) 09/04/2025   Pneumococcal Vaccine: 50+ Years  Completed   Influenza Vaccine  Completed   Bone Density Scan  Completed   Hepatitis C Screening  Completed   Zoster Vaccines- Shingrix  Completed   Meningococcal B Vaccine  Aged Out   Hepatitis B Vaccines 19-59 Average Risk  Discontinued   COVID-19 Vaccine  Discontinued    Review of Systems  Constitutional:  Negative for fever and malaise/fatigue.  HENT:  Negative for  congestion.   Eyes:  Negative for blurred vision.  Respiratory:  Negative for cough and shortness of breath.   Cardiovascular:  Negative for chest pain, palpitations and leg swelling.  Gastrointestinal:  Negative  for vomiting.  Musculoskeletal:  Negative for back pain.  Skin:  Negative for rash.  Neurological:  Positive for tremors. Negative for loss of consciousness and headaches.   Objective:  Vitals: body mass index is 43.56 kg/m. Today's Vitals   08/17/24 1214  BP: 120/80  Pulse: 94  SpO2: 96%  Weight: 274 lb (124.3 kg)  PainSc: 0-No pain   Physical Exam Vitals and nursing note reviewed.  Constitutional:      General: She is not in acute distress.    Appearance: Normal appearance. She is not ill-appearing or toxic-appearing.  HENT:     Head: Normocephalic and atraumatic.     Right Ear: Hearing, tympanic membrane, ear canal and external ear normal.     Left Ear: Hearing, tympanic membrane, ear canal and external ear normal.     Mouth/Throat:     Pharynx: Oropharynx is clear.  Eyes:     Extraocular Movements: Extraocular movements intact.     Pupils: Pupils are equal, round, and reactive to light.  Neck:     Thyroid : No thyroid  mass, thyromegaly or thyroid  tenderness.     Vascular: No carotid bruit.  Cardiovascular:     Rate and Rhythm: Normal rate. Rhythm irregularly irregular.     Pulses:          Dorsalis pedis pulses are 2+ on the right side and 2+ on the left side.     Heart sounds: Normal heart sounds. No murmur heard.    No friction rub. No gallop.     Comments: Occasional irregular contraction.  Pulmonary:     Effort: Pulmonary effort is normal.     Breath sounds: Normal breath sounds. No decreased breath sounds, wheezing, rhonchi or rales.  Chest:     Chest wall: No mass.  Abdominal:     Palpations: Abdomen is soft. There is no hepatomegaly, splenomegaly or mass.     Tenderness: There is no abdominal tenderness.     Hernia: No hernia is present.  Musculoskeletal:     Cervical back: Normal range of motion.     Right lower leg: No edema.     Left lower leg: No edema.  Feet:     Comments: Sensation and positional sense intact.  Lymphadenopathy:     Cervical: No  cervical adenopathy.     Upper Body:     Right upper body: No supraclavicular adenopathy.     Left upper body: No supraclavicular adenopathy.  Skin:    General: Skin is warm and dry.  Neurological:     General: No focal deficit present.     Mental Status: She is alert and oriented to person, place, and time. Mental status is at baseline.     Sensory: Sensation is intact.     Motor: Motor function is intact. No weakness.     Deep Tendon Reflexes: Reflexes are normal and symmetric.  Psychiatric:        Attention and Perception: Attention normal.        Mood and Affect: Mood normal.        Speech: Speech normal.        Behavior: Behavior normal.        Thought Content: Thought content normal.        Cognition and Memory:  Cognition normal.        Judgment: Judgment normal.     Current Outpatient Medications  Medication Instructions   acetaminophen  (TYLENOL ) 500 mg, Every 6 hours PRN   ADVAIR  HFA 45-21 MCG/ACT inhaler 2 puffs, Inhalation, 2 times daily   albuterol  (VENTOLIN  HFA) 108 (90 Base) MCG/ACT inhaler Two puffs every 4 hours if needed for wheezing or coughing .  May use 2 puffs 5-15 minutes prior to exercise.   azelastine  (ASTELIN ) 0.1 % nasal spray 1-2 sprays, Each Nare, 2 times daily PRN   benzonatate  (TESSALON ) 100 mg, Oral, 3 times daily PRN   carbidopa -levodopa  (SINEMET  IR) 25-100 MG tablet 1 tablet, Oral, 3 times daily, Take at 9 AM, 1 PM and 7 PM daily.   clobetasol  ointment (TEMOVATE ) 0.05 % 1 Application, Topical, 2 times daily   famotidine  (PEPCID ) 20 MG tablet TAKE 1 TABLET BY MOUTH 2 TIMES A DAY; KEEP APPOINTMENT FOR FURTHER REFILLS   fexofenadine  (ALLEGRA ) 180 mg, Oral, Daily   fluticasone  (FLONASE  SENSIMIST) 27.5 MCG/SPRAY nasal spray 2 sprays, Nasal, Daily   Glucosamine-Chondroitin (MOVE FREE PO) 1 tablet, Every morning   levothyroxine  (SYNTHROID ) 88 mcg, Oral, Daily   magnesium  oxide (MAG-OX) 400 mg, Every morning   metFORMIN  (GLUCOPHAGE ) 500 mg, Oral, 2 times  daily with meals   montelukast  (SINGULAIR ) 10 mg, Oral, Daily at bedtime   nystatin  (MYCOSTATIN ) 100000 UNIT/ML suspension 5 mLs, 4 times daily PRN   omeprazole  (PRILOSEC ) 20 MG capsule Oral, Daily   Polyethyl Glycol-Propyl Glycol (SYSTANE) 0.4-0.3 % SOLN 1-2 drops, 3 times daily PRN   Probiotic Product (PROBIOTIC PO) 1 capsule, Every morning   rosuvastatin  (CRESTOR ) 5 mg, Oral, Daily   Spacer/Aero-Holding Chambers (AEROCHAMBER MV) inhaler Use as instructed   triamcinolone  ointment (KENALOG ) 0.1 % 1 Application, Topical, 2 times daily   VITAMIN D , CHOLECALCIFEROL, PO 10,000 Units, Daily   Past Medical History:  Diagnosis Date   Allergy    Anxiety    Arthritis    Back pain    Chronic edema    Chronic hepatitis C without hepatic coma (HCC) 10/29/2012   Chronic pain syndrome 07/01/2018   Cirrhosis (HCC) 05/10/2017   Class 2 severe obesity with serious comorbidity and body mass index (BMI) of 37.0 to 37.9 in adult 10/25/2018   Cold hands and feet    Complication of anesthesia    difficulty waking up/dizzy/lightheaded   Controlled type 2 diabetes mellitus without complication, without long-term current use of insulin  (HCC) 07/11/2018   Cough    Dry eye syndrome of both eyes 09/02/2022   Dysrhythmia, cardiac 09/28/2011   She has no records with her today. She tells me that a few years ago in [place] she had palpitations and a cardiology placed her on digoxin , she does not know what type of dysrhythmia. She has not had any palpitations recently     Environmental allergies    Eye pain    Fatigue    Gastroesophageal reflux disease 11/05/2015   H/O varicella    H/O varicose veins    Hepatic cirrhosis (HCC)    Hepatitis C    History of breast cancer 01/15/2020   History of cardiac arrhythmia 01/15/2020   History of chemotherapy 2013   left breast cancer   History of hepatitis C 01/15/2020   History of left hip replacement 08/29/2018   History of measles, mumps, or rubella    History of  osteoporosis 01/15/2020   History of total right hip replacement 08/29/2018  History of TTP (thrombotic thrombocytopenic purpura) 10/29/2012   Hx of radiation therapy 12/15/11 - 01/29/12   left breast   Hyperlipidemia    Hypokalemia 12/29/2013   Hypothyroidism    Intrinsic atopic dermatitis 01/15/2020   Irregular heart beat    under control   Joint pain    Leg cramps    Lymphedema of arm 12/15/2011   Malignant neoplasm of axillary tail of left breast in female, estrogen receptor positive (HCC) 08/12/2015   Malignant neoplasm of upper-outer quadrant of left breast in female, estrogen receptor positive (HCC) 07/17/2014   Moderate persistent asthma without complication 01/15/2020   Neuromuscular disorder (HCC)    left foot nerve damage- neuropathy    Oral candidiasis 09/02/2022   Osteopenia    Other allergic rhinitis 10/29/2012   Palpitations    Prediabetes 10/25/2018   Radiculopathy 07/2020   Rheumatoid arthritis (HCC)    Ringing in ear    Rosacea, acne 09/28/2011   Severe persistent asthma without complication (HCC) 09/02/2022   Status post bilateral hip replacements 10/29/2012   Swallowing difficulty    Swelling of both lower extremities    Thrombocytopenia, primary (HCC)    TTP (thrombotic thrombocytopenic purpura) (HCC)    1982   Vitamin D  deficiency 10/25/2018   Medical/Surgical History Narrative:  Allergic/Intolerant to: Allergies[1]  Past Surgical History:  Procedure Laterality Date   BREAST LUMPECTOMY  08/03/11   left lumpectomy and slnbx,T1cN0,triple pos   CATARACT EXTRACTION, BILATERAL     COLONOSCOPY  01/26/2019   COLONOSCOPY  08/12/2020   elbow pins Left    FOOT SURGERY Left    multiple   POLYPECTOMY     PORTACATH PLACEMENT  08/03/2011   Procedure: INSERTION PORT-A-CATH;  Surgeon: Deward GORMAN Curvin DOUGLAS, MD;  Location: [facility] OR;  Service: General;  Laterality: Right;   portacath removal     THORACIC DISCECTOMY Left 06/21/2023   Procedure: Laminectomy and  Foraminotomy - left - Thoracic eleven-Thoracic twelve;  Surgeon: Louis Shove, MD;  Location: [facility] OR;  Service: Neurosurgery;  Laterality: Left;   TOTAL HIP ARTHROPLASTY Bilateral    UPPER GASTROINTESTINAL ENDOSCOPY     UPPER GASTROINTESTINAL ENDOSCOPY  08/12/2020   Family History  Problem Relation Age of Onset   Pneumonia Mother    COPD Mother    Colon polyps Mother    Heart disease Mother    Thyroid  disease Mother    Obesity Mother    COPD Father    Obesity Father    Brain cancer Maternal Grandfather    Alcohol  abuse Maternal Grandfather    Hyperlipidemia Neg Hx    Hypertension Neg Hx    Colon cancer Neg Hx    Rectal cancer Neg Hx    Stomach cancer Neg Hx    Esophageal cancer Neg Hx    Parkinson's disease Neg Hx     Social History   Social History Narrative   Pt lives with husband    Retired    Caffeine 24oz cup a day    Most Recent Health Risks Assessment:   Most Recent Social Determinants of Health (Including Hx of Tobacco, Alcohol , and Drug Use) SDOH Screenings   Food Insecurity: No Food Insecurity (02/08/2024)  Housing: Low Risk (02/08/2024)  Transportation Needs: No Transportation Needs (02/08/2024)  Utilities: Not At Risk (02/08/2024)  Alcohol  Screen: Low Risk (02/08/2024)  Depression (PHQ2-9): Low Risk (02/10/2024)  Financial Resource Strain: Low Risk (02/08/2024)  Physical Activity: Insufficiently Active (02/08/2024)  Social Connections: Moderately Isolated (02/08/2024)  Stress:  No Stress Concern Present (02/08/2024)  Tobacco Use: Low Risk (08/17/2024)  Health Literacy: Adequate Health Literacy (02/08/2024)   Social History[2] Most Recent Functional Status Assessment:    02/09/2024    1:59 PM  In your present state of health, do you have any difficulty performing the following activities:  Hearing? 0  Vision? 0  Difficulty concentrating or making decisions? 0  Walking or climbing stairs? 1  Dressing or bathing? 0  Doing errands, shopping? 1  Preparing Food  and eating ? N  Using the Toilet? N  In the past six months, have you accidently leaked urine? N  Do you have problems with loss of bowel control? N  Managing your Medications? N  Managing your Finances? N  Housekeeping or managing your Housekeeping? Y   Most Recent Fall Risk Assessment:    02/09/2024    2:00 PM  Fall Risk   Falls in the past year? 0  Number falls in past yr: 0  Injury with Fall? 0   Risk for fall due to : No Fall Risks  Follow up Falls prevention discussed;Education provided;Falls evaluation completed     Data saved with a previous flowsheet row definition   Most Recent Anxiety/Depression Screenings:    02/10/2024   11:26 AM 02/09/2024    2:06 PM  PHQ 2/9 Scores  PHQ - 2 Score 0 0  PHQ- 9 Score 0       Data saved with a previous flowsheet row definition      11/10/2018    1:04 PM 10/25/2018   10:55 AM  GAD 7 : Generalized Anxiety Score  Nervous, Anxious, on Edge 0 0  Control/stop worrying 1 0  Worry too much - different things 1 2  Trouble relaxing 0 0  Restless 1 1  Easily annoyed or irritable 0 0  Afraid - awful might happen 0 0  Total GAD 7 Score 3 3  Anxiety Difficulty Not difficult at all Somewhat difficult   Most Recent Cognitive Screening:    02/08/2023    2:02 PM  6CIT Screen  What Year? 0 points  What month? 0 points  What time? 0 points  Count back from 20 0 points  Months in reverse 0 points  Repeat phrase 0 points  Total Score 0 points   Most Recent Vision/Hearing Screenings:No results found. Results:  Studies Obtained And Personally Reviewed By Me:  01/31/2024 Mammogram No mammographic evidence of malignancy. Repeat in one year.   08/12/2020 Colonoscopy Diverticulosis in the sigmoid colon and in the descending colon. One 3 mm possible polyp in the descending colon. Resected and retrieved. One 3 mm polyp at the hepatic flexure. Resected and retrieved. Pathology found to be inflammatory polyp not associated with colon cancer.  There was a small amount of residual tubular adenoma that was not removed. The examination was otherwise normal on direct and retroflexion views. Repeat in 3 years.    Labs:  CBC w/ Differential Lab Results  Component Value Date   WBC 4.0 08/11/2024   RBC 4.26 08/11/2024   HGB 12.8 08/11/2024   HCT 39.6 08/11/2024   PLT 147 08/11/2024   MCV 93.0 08/11/2024   MCH 30.0 08/11/2024   MCHC 32.3 08/11/2024   RDW 13.6 08/11/2024   MPV 12.1 08/11/2024   LYMPHSABS 542 (L) 02/05/2023   MONOABS 1.0 05/11/2020   BASOSABS 40 08/11/2024    Comprehensive Metabolic Panel Lab Results  Component Value Date   NA 140 08/11/2024   K  4.8 08/11/2024   CL 101 08/11/2024   CO2 28 08/11/2024   GLUCOSE 102 (H) 08/11/2024   BUN 15 08/11/2024   CREATININE 1.17 (H) 08/11/2024   CALCIUM  8.1 (L) 08/11/2024   PROT 7.2 08/11/2024   ALBUMIN 3.8 05/24/2023   AST 15 08/11/2024   ALT 9 08/11/2024   ALKPHOS 42 05/24/2023   BILITOT 1.0 08/11/2024   GFR 65.09 09/25/2020   EGFR 51 (L) 08/11/2024   GFRNONAA >60 05/24/2023   Lipid Panel  Lab Results  Component Value Date   CHOL 165 08/11/2024   HDL 75 08/11/2024   LDLCALC 69 08/11/2024   TRIG 119 08/11/2024   A1c Lab Results  Component Value Date   HGBA1C 5.7 (H) 08/11/2024    TSH Lab Results  Component Value Date   TSH 2.97 08/11/2024    Assessment & Plan:    She sees Allergist for moderate persistent asthma and allergic rhinitis treated with Ventolin  inhaler, Advair  inhaler, montelukast , dupilumab  300 mg every 14 days, benralizumab .  She has a history of atopic dermatitis.  History of oral thrush and has been treated with nystatin  orally.   Primary Parkinsonism: treated with Sinemet  IR 25-100 3 times daily. Followed by Dr. Buck, Neurologist.    05/12/2024 Brain Datascan Bilateral decreased radiotracer activity within LEFT and RIGHT striatum. Findings suggest Parkinsonian syndrome pathology.    Tachycrdia: treated with Metoprolol  succinate  12.5 mg twice daily as needed for tachycardia.Pulse today is 94. Blood pressure is normal at 120/80   GE Reflux: treated with famotidine  20 mg twice daily, omeprazole  20 mg twice daily before meals.   Hyperlipidemia: treated with rosuvastatin  5 mg daily.  08/11/2024 Lipid panel normal.   Diabetes mellitus, Type II; 12/12 HgbA1c 5.7%.   Hypothyroidism treated with Levothyroxine  88 mcg daily. And stable  Grade 3 severe obesity- continue to watch diet. Not able to exercise much due to Parkinsonism.   01/31/2024 Mammogram No mammographic evidence of malignancy. Repeat in one year.   08/12/2020 Colonoscopy Diverticulosis in the sigmoid colon and in the descending colon. One 3 mm possible polyp in the descending colon. Resected and retrieved. One 3 mm polyp at the hepatic flexure. Resected and retrieved. Pathology found to be inflammatory polyp not associated with colon cancer. There was a small amount of residual tubular adenoma that was not removed. The examination was otherwise normal on direct and retroflexion views. Repeat in 3 years.      Annual Wellness Visit done today including the all of the following: Reviewed patient's Family Medical History Reviewed patient's SDOH and reviewed tobacco, alcohol , and drug use.  Reviewed and updated list of patient's medical providers Assessment of cognitive impairment was done Assessed patient's functional ability Established a written schedule for health screening services Health Risk Assessent Completed and Reviewed  Discussed health benefits of physical activity, and encouraged her to engage in regular exercise appropriate for her age and condition.    I,Makayla C Reid,acting as a scribe for Tara JINNY Hailstone, MD.,have documented all relevant documentation on the behalf of Tara JINNY Hailstone, MD,as directed by  Tara JINNY Hailstone, MD while in the presence of Tara JINNY Hailstone, MD.  I, Tara JINNY Hailstone, MD, have reviewed all documentation for and agree with the  above Annual Wellness Visit documentation.  Tara JINNY Hailstone, MD Internal Medicine 08/17/2024     [1]  Allergies Allergen Reactions   Aspirin Other (See Comments)    Pt has a hx of TTP.    Nsaids Other (  See Comments)    Pt has a hx of TTP.   [2]  Social History Tobacco Use   Smoking status: Never   Smokeless tobacco: Never  Vaping Use   Vaping status: Never Used  Substance Use Topics   Alcohol  use: Yes    Alcohol /week: 1.0 standard drink of alcohol     Types: 1 Glasses of wine per week    Comment: occ   Drug use: No   "

## 2024-08-22 ENCOUNTER — Ambulatory Visit: Admitting: Occupational Therapy

## 2024-08-22 DIAGNOSIS — M25611 Stiffness of right shoulder, not elsewhere classified: Secondary | ICD-10-CM

## 2024-08-22 DIAGNOSIS — R29818 Other symptoms and signs involving the nervous system: Secondary | ICD-10-CM

## 2024-08-22 DIAGNOSIS — M25612 Stiffness of left shoulder, not elsewhere classified: Secondary | ICD-10-CM

## 2024-08-22 DIAGNOSIS — R278 Other lack of coordination: Secondary | ICD-10-CM

## 2024-08-22 DIAGNOSIS — M25621 Stiffness of right elbow, not elsewhere classified: Secondary | ICD-10-CM

## 2024-08-22 DIAGNOSIS — M25622 Stiffness of left elbow, not elsewhere classified: Secondary | ICD-10-CM

## 2024-08-22 NOTE — Therapy (Addendum)
 " OUTPATIENT OCCUPATIONAL THERAPY [facility] treatment  Patient Name: Tara [name] MRN: [id number] DOB:[birth year], 68 y.o., female Today's Date: 08/22/2024  PCP: Dr. Perri REFERRING PROVIDER: Dr. Perri  END OF SESSION:  OT End of Session - 08/22/24 1339     Visit Number 5    Number of Visits 25    Date for Recertification  10/12/24    Authorization Type Medicare    Authorization - Visit Number 5    Authorization - Number of Visits 10    Progress Note Due on Visit 10    OT Start Time 1225    OT Stop Time 1317    OT Time Calculation (min) 52 min    Activity Tolerance Patient tolerated treatment well    Behavior During Therapy [facility] for tasks assessed/performed             Past Medical History:  Diagnosis Date   Allergy    Anxiety    Arthritis    Back pain    Chronic edema    Chronic hepatitis C without hepatic coma (HCC) 10/29/2012   Chronic pain syndrome 07/01/2018   Cirrhosis (HCC) 05/10/2017   Class 2 severe obesity with serious comorbidity and body mass index (BMI) of 37.0 to 37.9 in adult 10/25/2018   Cold hands and feet    Complication of anesthesia    difficulty waking up/dizzy/lightheaded   Controlled type 2 diabetes mellitus without complication, without long-term current use of insulin  (HCC) 07/11/2018   Cough    Dry eye syndrome of both eyes 09/02/2022   Dysrhythmia, cardiac 09/28/2011   She has no records with her today. She tells me that a few years ago in [place] she had palpitations and a cardiology placed her on digoxin , she does not know what type of dysrhythmia. She has not had any palpitations recently     Environmental allergies    Eye pain    Fatigue    Gastroesophageal reflux disease 11/05/2015   H/O varicella    H/O varicose veins    Hepatic cirrhosis (HCC)    Hepatitis C    History of breast cancer 01/15/2020   History of cardiac arrhythmia 01/15/2020   History of chemotherapy 2013   left breast cancer   History of hepatitis C  01/15/2020   History of left hip replacement 08/29/2018   History of measles, mumps, or rubella    History of osteoporosis 01/15/2020   History of total right hip replacement 08/29/2018   History of TTP (thrombotic thrombocytopenic purpura) 10/29/2012   Hx of radiation therapy 12/15/11 - 01/29/12   left breast   Hyperlipidemia    Hypokalemia 12/29/2013   Hypothyroidism    Intrinsic atopic dermatitis 01/15/2020   Irregular heart beat    under control   Joint pain    Leg cramps    Lymphedema of arm 12/15/2011   Malignant neoplasm of axillary tail of left breast in female, estrogen receptor positive (HCC) 08/12/2015   Malignant neoplasm of upper-outer quadrant of left breast in female, estrogen receptor positive (HCC) 07/17/2014   Moderate persistent asthma without complication 01/15/2020   Neuromuscular disorder (HCC)    left foot nerve damage- neuropathy    Oral candidiasis 09/02/2022   Osteopenia    Other allergic rhinitis 10/29/2012   Palpitations    Prediabetes 10/25/2018   Radiculopathy 07/2020   Rheumatoid arthritis (HCC)    Ringing in ear    Rosacea, acne 09/28/2011   Severe persistent asthma without complication (HCC) 09/02/2022  Status post bilateral hip replacements 10/29/2012   Swallowing difficulty    Swelling of both lower extremities    Thrombocytopenia, primary (HCC)    TTP (thrombotic thrombocytopenic purpura) (HCC)    1982   Vitamin D  deficiency 10/25/2018   Past Surgical History:  Procedure Laterality Date   BREAST LUMPECTOMY  08/03/11   left lumpectomy and slnbx,T1cN0,triple pos   CATARACT EXTRACTION, BILATERAL     COLONOSCOPY  01/26/2019   COLONOSCOPY  08/12/2020   elbow pins Left    FOOT SURGERY Left    multiple   POLYPECTOMY     PORTACATH PLACEMENT  08/03/2011   Procedure: INSERTION PORT-A-CATH;  Surgeon: Deward GORMAN Curvin DOUGLAS, MD;  Location: [facility] OR;  Service: General;  Laterality: Right;   portacath removal     THORACIC DISCECTOMY Left 06/21/2023    Procedure: Laminectomy and Foraminotomy - left - Thoracic eleven-Thoracic twelve;  Surgeon: Louis Shove, MD;  [facility];  Service: Neurosurgery;  Laterality: Left;   TOTAL HIP ARTHROPLASTY Bilateral    UPPER GASTROINTESTINAL ENDOSCOPY     UPPER GASTROINTESTINAL ENDOSCOPY  08/12/2020   Patient Active Problem List   Diagnosis Date Noted   Thoracic radiculopathy 06/21/2023   PSVT (paroxysmal supraventricular tachycardia) (HCC) asymptomatic short runs on event monitor September 2024 06/03/2023   Mitral regurgitation 06/03/2023   Preoperative cardiovascular examination 06/03/2023   Allergy    Anxiety    Back pain    Chronic edema    Cold hands and feet    Complication of anesthesia    Environmental allergies    Eye pain    Fatigue    H/O varicella    H/O varicose veins    Hepatic cirrhosis (HCC)    Hepatitis C    History of measles, mumps, or rubella    Hyperlipidemia    Irregular heart beat    Joint pain    Leg cramps    Neuromuscular disorder (HCC)    Osteopenia    Palpitations    Rheumatoid arthritis (HCC)    Ringing in ear    Swallowing difficulty    Thrombocytopenia, primary (HCC)    TTP (thrombotic thrombocytopenic purpura) (HCC)    Severe persistent asthma without complication (HCC) 09/02/2022   Oral candidiasis 09/02/2022   Dry eye syndrome of both eyes 09/02/2022   Radiculopathy 07/2020   Moderate persistent asthma without complication 01/15/2020   History of cardiac arrhythmia 01/15/2020   History of breast cancer 01/15/2020   History of osteoporosis 01/15/2020   Intrinsic atopic dermatitis 01/15/2020   History of hepatitis C 01/15/2020   Cough 01/15/2020   Arthritis 11/10/2019   Prediabetes 10/25/2018   Class 2 severe obesity with serious comorbidity and body mass index (BMI) of 37.0 to 37.9 in adult 10/25/2018   Vitamin D  deficiency 10/25/2018   History of left hip replacement 08/29/2018   History of total right hip replacement 08/29/2018    Controlled type 2 diabetes mellitus without complication, without long-term current use of insulin  (HCC) 07/11/2018   Chronic pain syndrome 07/01/2018   Cirrhosis (HCC) 05/10/2017   Gastroesophageal reflux disease 11/05/2015   Malignant neoplasm of axillary tail of left breast in female, estrogen receptor positive (HCC) 08/12/2015   Malignant neoplasm of upper-outer quadrant of left breast in female, estrogen receptor positive (HCC) 07/17/2014   Hypokalemia 12/29/2013   Other allergic rhinitis 10/29/2012   Status post bilateral hip replacements 10/29/2012   History of TTP (thrombotic thrombocytopenic purpura) 10/29/2012   Chronic hepatitis C without  hepatic coma (HCC) 10/29/2012   Hx of radiation therapy    Lymphedema of arm 12/15/2011   Rosacea, acne 09/28/2011   Dysrhythmia, cardiac 09/28/2011   Hypothyroidism 09/07/2011   History of chemotherapy 2013    ONSET DATE: 05/29/24- referral date  REFERRING DIAG:  Diagnosis  G20.A1 (ICD-10-CM) - Parkinson's disease, unspecified whether dyskinesia present, unspecified whether manifestations fluctuate (HCC)    THERAPY DIAG:  Other symptoms and signs involving the nervous system  Stiffness of left shoulder, not elsewhere classified  Stiffness of right shoulder, not elsewhere classified  Other lack of coordination  Stiffness of left elbow, not elsewhere classified  Stiffness of right elbow, not elsewhere classified  Rationale for Evaluation and Treatment: Rehabilitation  SUBJECTIVE:   SUBJECTIVE STATEMENT: Pt reports using walker to walk into gym Pt accompanied by: significant other  PERTINENT HISTORY: Hx per chart- 68 year old female with  complicated medical history of asthma, hypothyroidism, reflux disease, diabetes, hyperlipidemia, history of breast cancer with status postsurgery and chemoradiation, anxiety, depression, history of hepatitis C in the context of blood transfusion, with a history of TTP, chronic liver disease,  chronic back pain, rheumatoid arthritis, status post multiple surgeries including breast, back,  lumpectomy, cataract extractions, left foot surgery, polypectomy, bilateral total hip arthroplasties, chronic edema, chronic pain syndrome, and severe obesity with a BMI of over 40,  MD recommends starting carbidopa / levadopa 3x daily   Dat scan results IMPRESSION: Bilateral decreased radiotracer activity within LEFT and RIGHT striatum. Findings suggest Parkinsonian syndrome pathology.  PRECAUTIONS: Fall, lymphedema, hx of breast CA, hx of back surgery 1 year ago, hx of hep C  WEIGHT BEARING RESTRICTIONS: No  PAIN:  PAIN:  Are you having pain? Yes: NPRS scale: 4/10 Pain location: L shoulder, hx of lymphedema Pain description: aching Aggravating factors: malpositioning Relieving factors: repositioning   FALLS: Has patient fallen in last 6 months? No  LIVING ENVIRONMENT: Lives with: lives with their spouse Lives in: House/apartment   PLOF: Needs assistance with ADLs and Needs assistance with homemaking  PATIENT GOALS: manage tremors  OBJECTIVE:  Note: Objective measures were completed at Evaluation unless otherwise noted.  HAND DOMINANCE: Right  ADLs: Overall ADLs: increased time required Transfers/ambulation related to ADLs: Eating: mod I Grooming: mod I UB Dressing: needs assist donning shirt, jacket and adjusting bra straps LB Dressing: wears special slip on shoes  Toileting: wears pad for incontinence,  Bathing:mod I  Tub Shower transfers: walk in shower and tub, mod I   IADLs:  Handwriting: 75% legible and Mild micrographia  MOBILITY STATUS: Independent, small steps, decreased pace  POSTURE COMMENTS:  rounded shoulders and forward head  ACTIVITY TOLERANCE: Activity tolerance: decreased activity tolerance, fatigues quickly  FUNCTIONAL OUTCOME MEASURES: Fastening/unfastening 3 buttons: 45.67 Physical performance test: PPT#2 (simulated eating) 16.09 & PPT#4  (donning/doffing jacket): unable  COORDINATION: 9 Hole Peg test: Right: 30.87 sec; Left: 42.91 sec Box and Blocks:  Right 44blocks, Left 39 blocks Tremors: Resting  UE ROM:  shoulder flexion: RUE 90, LUE 70, elbow extension: RUE -20, LUE -45 hx of elbow fx s/p surgery, elbow flexion 140 Pt with significant lymphedema in LUE s/p breast CA    SENSATION: Not tested    COGNITION: Overall cognitive status: to be [facility] for taks performed, further assess in a functional context  OBSERVATIONS: Bradykinesia and bilateral resting tremor left greater than right  TREATMENT DATE:  08/21/24-PWR! hands basic 4, 5-10 reps each, min v.c for positioning, amplitude  followed by flipping, dealing and stacking coins with big movements min v.c Pt practiced carrying a plate in right and left hands, min v.c for techniques Therapist recommends pt has assistance if she goes to a buffet for Christmas so she does not drop her plate. Pt was shown the walker tray attachement and she practiced ambulating and using to carry a plate and water bottle 20 ft. Pt practiced writing with foam grip and min-mod v.c for techniques. Mod micrographia initally then pt wrote 3 sentences with good legibility and min decrease in letter size. Pt doffed vest without assist today, min A mod v.c to donn 08/14/24 PWR! up supine, closed chain chest press, min v.c Rolling like a log with PWR! twist to sit up. PWR! up, rock and step in seated mod v.c  rest break in between exercises. Donning vest with big movements min v.c / assist. Pt arrived with her walker today and she demonstrates improved mobility. Pt to show her trainer copies of exercises.  08/14/24 PWR! up supine, closed chain chest press, min v.c Rolling like a log with PWR! twist to sit up. PWR! up, rock and step in seated mod v.c  rest break in between  exercises. Donning vest with big movments min v.c / assist. Pt arrived with her walker today and she demonstrates improved mobility. Pt to show her trainer copies of exercises.  08/07/24-Supine PWR! up followed by closed chain chest press and shoulder flexion,min- v.c and facilitation  Low range seated chest press and shoulder flexion, min v.c Standing PWR! step at countertip close supervision/ minguard and min-mod v.c  Marching at countertop, min v.c, PWR! up for sit to stand Beginning coordination and PWR! hands, see pt instructions min-mod v.c   07/24/24-Supine PWR! up followed by closed chain chest press and shoulder flexion,min-mod v.c and facilitation  Pt performed shoulder abduction followed by clap overhead in supine, min-mod v.c    07/20/24- eval    PATIENT EDUCATION: Education details:PWR! hands review, handwriting recommendations walker tray Person educated: Patient and Spouse Education method: Explanation, demonstration, handout Education comprehension: verbalized understanding, returned demonstration  HOME EXERCISE PROGRAM: supine cane, PWR! hands, flipping and dealing cards 11/24  GOALS: Goals reviewed with patient? Yes  SHORT TERM GOALS: Target date: 08/19/24  I with HEP Baseline: Goal status:  ongoing needs reinforcement 08/22/24 2.  I with adapted strategies/ AE to maximize pt's safety and I with ADLs/IADLS. Baseline:  Goal status: ongoing 08/14/24  3.  Pt will demonstrate improved LUE functional use as evidenced by increasing box/blocks by 3 blocks Baseline: R 44, L 39 Goal status:ongoing 08/22/24  4.  Pt will demonstrate improved ease with feeding as evidenced by decreasing PPT#2 to 13 secs or less. Baseline: 16.09 sec Goal status: ongoing  08/22/24  5.  Pt will demonstrate ability to write a sentence with 100% legibility and minimimal decrease in letter size.  Goal status: ongoing, strategies reviewed, needs reinforcement 08/22/24   LONG TERM  GOALS: Target date: 10/12/24  Pt will demonstrate improved fine motor coordination for ADLs as evidenced by decreasing 9 hole peg test score for LUE by 3 secs. Baseline: R 30.87, L 42.91 Goal status: INITIAL  2.  Pt will demonstrate improved ease with fastening buttons as evidenced by decreasing 3 button/ unbutton score to 42 secs or less. Baseline:  Goal status: INITIAL  3.  Pt will verbalize understanding of community resources and  ways to prevent PD related complications. Baseline:  Goal status: INITIAL  4.  Pt will demonstrate ability to retrieve a lightweight object at 100 shoulder flexion with -15 elbow extension with RUE.  Goal status: INITIAL  5.  Pt will demonstrate ability to retrieve a lightweight object at 80 shoulder flexion with LUE Goal status: INITIAL  ASSESSMENT:  CLINICAL IMPRESSION: Patient is progressing towards goals. Pt demonstrates improved handwriting using foam grip and strategies.SABRAPERFORMANCE DEFICITS: in functional skills including ADLs, IADLs, coordination, dexterity, edema, ROM, strength, flexibility, Fine motor control, Gross motor control, mobility, balance, endurance, decreased knowledge of precautions, and decreased knowledge of use of DME,  and psychosocial skills including coping strategies, environmental adaptation, habits, interpersonal interactions, and routines and behaviors.   IMPAIRMENTS: are limiting patient from ADLs, IADLs, rest and sleep, play, leisure, and social participation.   COMORBIDITIES:  may have co-morbidities  that affects occupational performance. Patient will benefit from skilled OT to address above impairments and improve overall function.  MODIFICATION OR ASSISTANCE TO COMPLETE EVALUATION: No modification of tasks or assist necessary to complete an evaluation.  OT OCCUPATIONAL PROFILE AND HISTORY: Detailed assessment: Review of records and additional review of physical, cognitive, psychosocial history related to current  functional performance.  CLINICAL DECISION MAKING: Moderate - several treatment options, min-mod task modification necessary  REHAB POTENTIAL: Good  EVALUATION COMPLEXITY: Low    PLAN:  OT FREQUENCY: 2x/week plus eval  OT DURATION: 12 weeks  PLANNED INTERVENTIONS: 97168 OT Re-evaluation, 97535 self care/ADL training, 02889 therapeutic exercise, 97530 therapeutic activity, 97112 neuromuscular re-education, 97140 manual therapy, 97113 aquatic therapy, 97035 ultrasound, 97018 paraffin, 02989 moist heat, 97010 cryotherapy, 97750 Physical Performance Testing, 02239 Orthotic Initial, 97763 Orthotic/Prosthetic subsequent, passive range of motion, balance training, functional mobility training, visual/perceptual remediation/compensation, psychosocial skills training, energy conservation, coping strategies training, patient/family education, and DME and/or AE instructions  RECOMMENDED OTHER SERVICES: Pt may benefit from PT in the future for lymphadema  CONSULTED AND AGREED WITH PLAN OF CARE: Patient and family member/caregiver  PLAN FOR NEXT SESSION:review HEP, ADL strategies   Lysandra Loughmiller, OT 08/22/2024, 1:41 PM   "

## 2024-08-27 ENCOUNTER — Encounter: Payer: Self-pay | Admitting: Internal Medicine

## 2024-08-27 NOTE — Patient Instructions (Addendum)
 It was a pleasure to see you. Continue close follow up with Endocrinology and Neurology.

## 2024-08-29 ENCOUNTER — Ambulatory Visit: Admitting: Occupational Therapy

## 2024-09-07 NOTE — Therapy (Signed)
 " OUTPATIENT OCCUPATIONAL THERAPY [facility] treatment  Patient Name: Tara [name] MRN: [id number] DOB:October 27, 1955, 69 y.o., female Today's Date: 09/08/2024  PCP: Dr. Perri REFERRING PROVIDER: Dr. Perri  END OF SESSION:  OT End of Session - 09/08/24 0854     Visit Number 6    Number of Visits 25    Date for Recertification  10/12/24    Authorization Type Medicare & AARP    Authorization - Visit Number 6    Authorization - Number of Visits 10    Progress Note Due on Visit 10    OT Start Time 0848    OT Stop Time 0932    OT Time Calculation (min) 44 min    Activity Tolerance Patient tolerated treatment well    Behavior During Therapy [facility] for tasks assessed/performed              Past Medical History:  Diagnosis Date   Allergy    Anxiety    Arthritis    Back pain    Chronic edema    Chronic hepatitis C without hepatic coma (HCC) 10/29/2012   Chronic pain syndrome 07/01/2018   Cirrhosis (HCC) 05/10/2017   Class 2 severe obesity with serious comorbidity and body mass index (BMI) of 37.0 to 37.9 in adult 10/25/2018   Cold hands and feet    Complication of anesthesia    difficulty waking up/dizzy/lightheaded   Controlled type 2 diabetes mellitus without complication, without long-term current use of insulin  (HCC) 07/11/2018   Cough    Dry eye syndrome of both eyes 09/02/2022   Dysrhythmia, cardiac 09/28/2011   She has no records with her today. She tells me that a few years ago in [place] she had palpitations and a cardiology placed her on digoxin , she does not know what type of dysrhythmia. She has not had any palpitations recently     Environmental allergies    Eye pain    Fatigue    Gastroesophageal reflux disease 11/05/2015   H/O varicella    H/O varicose veins    Hepatic cirrhosis (HCC)    Hepatitis C    History of breast cancer 01/15/2020   History of cardiac arrhythmia 01/15/2020   History of chemotherapy 2013   left breast cancer   History of hepatitis C  01/15/2020   History of left hip replacement 08/29/2018   History of measles, mumps, or rubella    History of osteoporosis 01/15/2020   History of total right hip replacement 08/29/2018   History of TTP (thrombotic thrombocytopenic purpura) 10/29/2012   Hx of radiation therapy 12/15/11 - 01/29/12   left breast   Hyperlipidemia    Hypokalemia 12/29/2013   Hypothyroidism    Intrinsic atopic dermatitis 01/15/2020   Irregular heart beat    under control   Joint pain    Leg cramps    Lymphedema of arm 12/15/2011   Malignant neoplasm of axillary tail of left breast in female, estrogen receptor positive (HCC) 08/12/2015   Malignant neoplasm of upper-outer quadrant of left breast in female, estrogen receptor positive (HCC) 07/17/2014   Moderate persistent asthma without complication 01/15/2020   Neuromuscular disorder (HCC)    left foot nerve damage- neuropathy    Oral candidiasis 09/02/2022   Osteopenia    Other allergic rhinitis 10/29/2012   Palpitations    Prediabetes 10/25/2018   Radiculopathy 07/2020   Rheumatoid arthritis (HCC)    Ringing in ear    Rosacea, acne 09/28/2011   Severe persistent asthma without  complication (HCC) 09/02/2022   Status post bilateral hip replacements 10/29/2012   Swallowing difficulty    Swelling of both lower extremities    Thrombocytopenia, primary (HCC)    TTP (thrombotic thrombocytopenic purpura) (HCC)    1982   Vitamin D  deficiency 10/25/2018   Past Surgical History:  Procedure Laterality Date   BREAST LUMPECTOMY  08/03/11   left lumpectomy and slnbx,T1cN0,triple pos   CATARACT EXTRACTION, BILATERAL     COLONOSCOPY  01/26/2019   COLONOSCOPY  08/12/2020   elbow pins Left    FOOT SURGERY Left    multiple   POLYPECTOMY     PORTACATH PLACEMENT  08/03/2011   Procedure: INSERTION PORT-A-CATH;  Surgeon: Deward GORMAN Curvin DOUGLAS, MD;  Location: [facility] OR;  Service: General;  Laterality: Right;   portacath removal     THORACIC DISCECTOMY Left 06/21/2023    Procedure: Laminectomy and Foraminotomy - left - Thoracic eleven-Thoracic twelve;  Surgeon: Louis Shove, MD;  [facility];  Service: Neurosurgery;  Laterality: Left;   TOTAL HIP ARTHROPLASTY Bilateral    UPPER GASTROINTESTINAL ENDOSCOPY     UPPER GASTROINTESTINAL ENDOSCOPY  08/12/2020   Patient Active Problem List   Diagnosis Date Noted   Thoracic radiculopathy 06/21/2023   PSVT (paroxysmal supraventricular tachycardia) (HCC) asymptomatic short runs on event monitor September 2024 06/03/2023   Mitral regurgitation 06/03/2023   Preoperative cardiovascular examination 06/03/2023   Allergy    Anxiety    Back pain    Chronic edema    Cold hands and feet    Complication of anesthesia    Environmental allergies    Eye pain    Fatigue    H/O varicella    H/O varicose veins    Hepatic cirrhosis (HCC)    Hepatitis C    History of measles, mumps, or rubella    Hyperlipidemia    Irregular heart beat    Joint pain    Leg cramps    Neuromuscular disorder (HCC)    Osteopenia    Palpitations    Rheumatoid arthritis (HCC)    Ringing in ear    Swallowing difficulty    Thrombocytopenia, primary (HCC)    TTP (thrombotic thrombocytopenic purpura) (HCC)    Severe persistent asthma without complication (HCC) 09/02/2022   Oral candidiasis 09/02/2022   Dry eye syndrome of both eyes 09/02/2022   Radiculopathy 07/2020   Moderate persistent asthma without complication 01/15/2020   History of cardiac arrhythmia 01/15/2020   History of breast cancer 01/15/2020   History of osteoporosis 01/15/2020   Intrinsic atopic dermatitis 01/15/2020   History of hepatitis C 01/15/2020   Cough 01/15/2020   Arthritis 11/10/2019   Prediabetes 10/25/2018   Class 2 severe obesity with serious comorbidity and body mass index (BMI) of 37.0 to 37.9 in adult 10/25/2018   Vitamin D  deficiency 10/25/2018   History of left hip replacement 08/29/2018   History of total right hip replacement 08/29/2018    Controlled type 2 diabetes mellitus without complication, without long-term current use of insulin  (HCC) 07/11/2018   Chronic pain syndrome 07/01/2018   Cirrhosis (HCC) 05/10/2017   Gastroesophageal reflux disease 11/05/2015   Malignant neoplasm of axillary tail of left breast in female, estrogen receptor positive (HCC) 08/12/2015   Malignant neoplasm of upper-outer quadrant of left breast in female, estrogen receptor positive (HCC) 07/17/2014   Hypokalemia 12/29/2013   Other allergic rhinitis 10/29/2012   Status post bilateral hip replacements 10/29/2012   History of TTP (thrombotic thrombocytopenic purpura) 10/29/2012  Chronic hepatitis C without hepatic coma (HCC) 10/29/2012   Hx of radiation therapy    Lymphedema of arm 12/15/2011   Rosacea, acne 09/28/2011   Dysrhythmia, cardiac 09/28/2011   Hypothyroidism 09/07/2011   History of chemotherapy 2013    ONSET DATE: 05/29/24- referral date  REFERRING DIAG:  Diagnosis  G20.A1 (ICD-10-CM) - Parkinson's disease, unspecified whether dyskinesia present, unspecified whether manifestations fluctuate (HCC)    THERAPY DIAG:  Other symptoms and signs involving the nervous system  Stiffness of left shoulder, not elsewhere classified  Stiffness of right shoulder, not elsewhere classified  Other lack of coordination  Stiffness of left elbow, not elsewhere classified  Stiffness of right elbow, not elsewhere classified  Rationale for Evaluation and Treatment: Rehabilitation  SUBJECTIVE:   SUBJECTIVE STATEMENT: Pt reports that she sees Dr. Buck on 09/13/23.  Currently only taking 1/2 pil Sinemet  3x/day. 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  Pt accompanied by: significant other  PERTINENT HISTORY: Hx per chart- 69 year old female with  complicated medical history of asthma, hypothyroidism, reflux disease, diabetes, hyperlipidemia, history of breast cancer with status postsurgery and chemoradiation, anxiety, depression, history of  hepatitis C in the context of blood transfusion, with a history of TTP, chronic liver disease, chronic back pain, rheumatoid arthritis, status post multiple surgeries including breast, back,  lumpectomy, cataract extractions, left foot surgery, polypectomy, bilateral total hip arthroplasties, chronic edema, chronic pain syndrome, and severe obesity with a BMI of over 40,  MD recommends starting carbidopa / levadopa 3x daily   Dat scan results IMPRESSION: Bilateral decreased radiotracer activity within LEFT and RIGHT striatum. Findings suggest Parkinsonian syndrome pathology.  PRECAUTIONS: Fall, lymphedema, hx of breast CA, hx of back surgery 1 year ago, hx of hep C  WEIGHT BEARING RESTRICTIONS: No  PAIN:  PAIN:  Are you having pain? Yes: NPRS scale: 4/10 Pain location: L shoulder, hx of lymphedema Pain description: aching Aggravating factors: malpositioning Relieving factors: repositioning   FALLS: Has patient fallen in last 6 months? No  LIVING ENVIRONMENT: Lives with: lives with their spouse Lives in: House/apartment   PLOF: Needs assistance with ADLs and Needs assistance with homemaking  PATIENT GOALS: manage tremors  OBJECTIVE:  Note: Objective measures were completed at Evaluation unless otherwise noted.  HAND DOMINANCE: Right  ADLs: Overall ADLs: increased time required Transfers/ambulation related to ADLs: Eating: mod I Grooming: mod I UB Dressing: needs assist donning shirt, jacket and adjusting bra straps LB Dressing: wears special slip on shoes  Toileting: wears pad for incontinence,  Bathing:mod I  Tub Shower transfers: walk in shower and tub, mod I   IADLs:  Handwriting: 75% legible and Mild micrographia  MOBILITY STATUS: Independent, small steps, decreased pace  POSTURE COMMENTS:  rounded shoulders and forward head  ACTIVITY TOLERANCE: Activity tolerance: decreased activity tolerance, fatigues quickly  FUNCTIONAL OUTCOME  MEASURES: Fastening/unfastening 3 buttons: 45.67 Physical performance test: PPT#2 (simulated eating) 16.09 & PPT#4 (donning/doffing jacket): unable  COORDINATION: 9 Hole Peg test: Right: 30.87 sec; Left: 42.91 sec Box and Blocks:  Right 44blocks, Left 39 blocks Tremors: Resting  UE ROM:  shoulder flexion: RUE 90, LUE 70, elbow extension: RUE -20, LUE -45 hx of elbow fx s/p surgery, elbow flexion 140 Pt with significant lymphedema in LUE s/p breast CA    SENSATION: Not tested    COGNITION: Overall cognitive status: to be [facility] for taks performed, further assess in a functional context  OBSERVATIONS: Bradykinesia and bilateral resting tremor left greater than right  TREATMENT DATE:   09/09/23:   In supine, PWR! Up and closed-chain chest press and shoulder flex with foam noodle with min facilitation and cueing for large amplitude and elbow ext and shoulder positioning.  Supine>sitting after education regarding large amplitude movement strategies and avoid pulling with UE due to risk of injury/pain.   Sitting, PWR! Up with min cueing for large amplitude movements.    Discussed activities pt is performing with personal trainer.  Recommended against overhead weights (now only doing rows with theraband) and avoid exercises that cause pain.  Recommended pt discuss difficulty sleeping with neurologist.  Pt demo difficulty with voice when initially laid down, but denied difficulty breathing.    Checked Box and blocks with LUE--see goals section below.  Writing 1 sentence with good legibility/size with printing.  Then in cursive with min decr in size, but good legibility.  Reviewed strategies (moving wrist, built-up grip, lined paper, fill up space/write big, PWR! Hands).    Pt educated in the following ADLs strategies to incr ease/safety and decr risk of future complications:    sit>stand (large amplitude strategies with feet apart and forward lean), supine>sit (walking over with feet, roll, and push to sit),  donning/doffing jacket x1 (doffed with min-mod cueing and donned with min A)--incorporating trunk rotation/looking at hand, positioning of shoulder/UE with reaching and use of head turns/vision with reaching   08/21/24-PWR! hands basic 4, 5-10 reps each, min v.c for positioning, amplitude  followed by flipping, dealing and stacking coins with big movements min v.c Pt practiced carrying a plate in right and left hands, min v.c for techniques Therapist recommends pt has assistance if she goes to a buffet for Christmas so she does not drop her plate. Pt was shown the walker tray attachement and she practiced ambulating and using to carry a plate and water bottle 20 ft. Pt practiced writing with foam grip and min-mod v.c for techniques. Mod micrographia initally then pt wrote 3 sentences with good legibility and min decrease in letter size. Pt doffed vest without assist today, min A mod v.c to donn  08/14/24 PWR! up supine, closed chain chest press, min v.c Rolling like a log with PWR! twist to sit up. PWR! up, rock and step in seated mod v.c  rest break in between exercises. Donning vest with big movements min v.c / assist. Pt arrived with her walker today and she demonstrates improved mobility. Pt to show her trainer copies of exercises.  08/14/24 PWR! up supine, closed chain chest press, min v.c Rolling like a log with PWR! twist to sit up. PWR! up, rock and step in seated mod v.c  rest break in between exercises. Donning vest with big movments min v.c / assist. Pt arrived with her walker today and she demonstrates improved mobility. Pt to show her trainer copies of exercises.  08/07/24-Supine PWR! up followed by closed chain chest press and shoulder flexion,min- v.c and facilitation  Low range seated chest press and shoulder flexion, min v.c Standing PWR! step  at countertip close supervision/ minguard and min-mod v.c  Marching at countertop, min v.c, PWR! up for sit to stand Beginning coordination and PWR! hands, see pt instructions min-mod v.c   07/24/24-Supine PWR! up followed by closed chain chest press and shoulder flexion,min-mod v.c and facilitation  Pt performed shoulder abduction followed by clap overhead in supine, min-mod v.c    07/20/24- eval    PATIENT EDUCATION: Education details:  ADL strategies: handwriting recommendations review, sit>stand, supine>sit,  donning/doffing jacket,  positioning of shoulder/UE with reaching and use of head turns/vision Person educated: Patient and Spouse Education method: Explanation, demonstration Education comprehension: verbalized understanding, returned demonstration, verbal cues required, and needs further education  HOME EXERCISE PROGRAM: supine cane, PWR! hands, flipping and dealing cards 11/24 09/09/23:  ADL strategies: handwriting recommendations review, sit>stand, supine>sit,  donning/doffing jacket, positioning of shoulder/UE with reaching and use of head turns/vision  GOALS: Goals reviewed with patient? Yes  SHORT TERM GOALS: Target date: 08/19/24  I with HEP Baseline: Goal status:  ongoing needs reinforcement 08/22/24, 09/09/23  2.  I with adapted strategies/ AE to maximize pt's safety and I with ADLs/IADLS. Baseline:  Goal status: ongoing 08/14/24, needs reinforcement 09/09/23  3.  Pt will demonstrate improved LUE functional use as evidenced by increasing box/blocks by 3 blocks Baseline: R 44, L 39 Goal status:    Ongoing.  09/09/23:  L-38 blocks  4.  Pt will demonstrate improved ease with feeding as evidenced by decreasing PPT#2 to 13 secs or less. Baseline: 16.09 sec Goal status: ongoing  08/22/24  5.  Pt will demonstrate ability to write a sentence with 100% legibility and minimimal decrease in letter size. Goal status: 09/09/23  Met today at this level (printing)   LONG TERM  GOALS: Target date: 10/12/24  Pt will demonstrate improved fine motor coordination for ADLs as evidenced by decreasing 9 hole peg test score for LUE by 3 secs. Baseline: R 30.87, L 42.91 Goal status: INITIAL  2.  Pt will demonstrate improved ease with fastening buttons as evidenced by decreasing 3 button/ unbutton score to 42 secs or less. Baseline:  Goal status: INITIAL  3.  Pt will verbalize understanding of community resources and ways to prevent PD related complications. Baseline:  Goal status: INITIAL  4.  Pt will demonstrate ability to retrieve a lightweight object at 100 shoulder flexion with -15 elbow extension with RUE.  Goal status: INITIAL  5.  Pt will demonstrate ability to retrieve a lightweight object at 80 shoulder flexion with LUE Goal status: INITIAL  ASSESSMENT:  CLINICAL IMPRESSION: Patient is progressing towards goals. Pt demonstrates improved handwriting using foam grip and strategies with STG #5 met.  Pt will need continued reinforcement of strategies for improved carryover.  PERFORMANCE DEFICITS: in functional skills including ADLs, IADLs, coordination, dexterity, edema, ROM, strength, flexibility, Fine motor control, Gross motor control, mobility, balance, endurance, decreased knowledge of precautions, and decreased knowledge of use of DME,  and psychosocial skills including coping strategies, environmental adaptation, habits, interpersonal interactions, and routines and behaviors.   IMPAIRMENTS: are limiting patient from ADLs, IADLs, rest and sleep, play, leisure, and social participation.   COMORBIDITIES:  may have co-morbidities  that affects occupational performance. Patient will benefit from skilled OT to address above impairments and improve overall function.  MODIFICATION OR ASSISTANCE TO COMPLETE EVALUATION: No modification of tasks or assist necessary to complete an evaluation.  OT OCCUPATIONAL PROFILE AND HISTORY: Detailed assessment: Review of  records and additional review of physical, cognitive, psychosocial history related to current functional performance.  CLINICAL DECISION MAKING: Moderate - several treatment options, min-mod task modification necessary  REHAB POTENTIAL: Good  EVALUATION COMPLEXITY: Low    PLAN:  OT FREQUENCY: 2x/week plus eval  OT DURATION: 12 weeks  PLANNED INTERVENTIONS: 97168 OT Re-evaluation, 97535 self care/ADL training, 02889 therapeutic exercise, 97530 therapeutic activity, 97112 neuromuscular re-education, 97140 manual therapy, 97113 aquatic therapy, 97035 ultrasound, 97018 paraffin, 02989 moist heat, 97010 cryotherapy, 97750 Physical Performance Testing, 02239 Orthotic Initial, [id number] Orthotic/Prosthetic  subsequent, passive range of motion, balance training, functional mobility training, visual/perceptual remediation/compensation, psychosocial skills training, energy conservation, coping strategies training, patient/family education, and DME and/or AE instructions  RECOMMENDED OTHER SERVICES: Pt may benefit from PT in the future for lymphadema  CONSULTED AND AGREED WITH PLAN OF CARE: Patient and family member/caregiver  PLAN FOR NEXT SESSION:  review HEP, continue with ADL strategies   Deyvi Bonanno, OTR/L 09/08/2024, 10:29 AM   "

## 2024-09-08 ENCOUNTER — Encounter: Payer: Self-pay | Admitting: Occupational Therapy

## 2024-09-08 ENCOUNTER — Ambulatory Visit: Attending: Internal Medicine | Admitting: Occupational Therapy

## 2024-09-08 DIAGNOSIS — R29818 Other symptoms and signs involving the nervous system: Secondary | ICD-10-CM | POA: Insufficient documentation

## 2024-09-08 DIAGNOSIS — R278 Other lack of coordination: Secondary | ICD-10-CM | POA: Insufficient documentation

## 2024-09-08 DIAGNOSIS — M25621 Stiffness of right elbow, not elsewhere classified: Secondary | ICD-10-CM | POA: Diagnosis present

## 2024-09-08 DIAGNOSIS — M25612 Stiffness of left shoulder, not elsewhere classified: Secondary | ICD-10-CM | POA: Insufficient documentation

## 2024-09-08 DIAGNOSIS — M25611 Stiffness of right shoulder, not elsewhere classified: Secondary | ICD-10-CM | POA: Diagnosis present

## 2024-09-08 DIAGNOSIS — M25622 Stiffness of left elbow, not elsewhere classified: Secondary | ICD-10-CM | POA: Diagnosis present

## 2024-09-10 ENCOUNTER — Other Ambulatory Visit: Payer: Self-pay | Admitting: Neurology

## 2024-09-10 ENCOUNTER — Other Ambulatory Visit: Payer: Self-pay | Admitting: Internal Medicine

## 2024-09-12 ENCOUNTER — Encounter: Payer: Self-pay | Admitting: Neurology

## 2024-09-12 ENCOUNTER — Ambulatory Visit (INDEPENDENT_AMBULATORY_CARE_PROVIDER_SITE_OTHER): Admitting: Neurology

## 2024-09-12 VITALS — BP 136/89 | HR 76 | Ht 66.0 in | Wt 273.0 lb

## 2024-09-12 DIAGNOSIS — Z9189 Other specified personal risk factors, not elsewhere classified: Secondary | ICD-10-CM

## 2024-09-12 DIAGNOSIS — R1310 Dysphagia, unspecified: Secondary | ICD-10-CM | POA: Diagnosis not present

## 2024-09-12 DIAGNOSIS — G20C Parkinsonism, unspecified: Secondary | ICD-10-CM

## 2024-09-12 MED ORDER — CARBIDOPA-LEVODOPA 25-100 MG PO TABS: 1.0000 | ORAL_TABLET | Freq: Three times a day (TID) | ORAL | 3 refills | Status: AC

## 2024-09-12 NOTE — Progress Notes (Signed)
 Subjective:    Patient ID: Tara [name] is a 69 y.o. female.  HPI    Interim history:   Tara [name] is a 69 year old female with an underlying complex and complicated medical history of asthma, hypothyroidism, reflux disease, diabetes, hyperlipidemia, history of breast cancer with status post surgery and chemoradiation, anxiety, depression, history of hepatitis C in the context of blood transfusion, with a history of TTP, chronic liver disease, chronic back pain, rheumatoid arthritis, status post multiple surgeries including breast lumpectomy, cataract extractions, left foot surgery, polypectomy, bilateral total hip arthroplasties, chronic edema, chronic pain syndrome, and severe obesity with a BMI of over 40, who presents for follow-up consultation of her parkinsonism.  The patient is accompanied by her husband today.  I last saw her in September 2025, at which time we talked about her DaTscan  results from September 2025.  She wanted to cancel her sleep study and did not wish to proceed with sleep apnea evaluation at the time.  She was agreeable to starting generic Sinemet  with gradual titration.  Today, 09/12/2024: She reports having had some dizziness with the levodopa , which is currently 1/2 pill tid. Still in OT outpatient and has a rolling walker for the past few weeks, mostly for longer distances. No recent falls. Some reduction of the tremor is noted by her in the afternoons.  She has not had any major nausea.  She tries to take the first dose about an hour before breakfast, second dose between 1 and 2 PM and third dose in the afternoon.  She would be willing to increase the levodopa  to a full pill 3 times daily at this time.  She has had occasional coughing when swallowing liquids.  She is not ready to pursue a speech therapy referral yet.  She is going to keep an eye.  She has some nasal stuffiness, particularly when trying to sleep on her right side her nose gets stopped up.  She does have  quite a bit of allergies and gets allergy shots.  She hydrates well with water, she drinks about 3 bottles of water per day, 23.7 ounce size.  She continues to stay active, works with a systems analyst and is still in occupational therapy about twice a week currently.  She has had no new symptoms as such.    The patient's allergies, current medications, family history, past medical history, past social history, past surgical history and problem list were reviewed and updated as appropriate.   Previously:  05/17/2024: I first met her at the request of her primary care physician on 04/18/2024, at which time she reported an approximately 2-year history of tremors affecting her hands, primarily her left hand.   She had a brain DaTscan  on 05/12/2024 and I have reviewed the test results:    IMPRESSION: Bilateral decreased radiotracer activity within LEFT and RIGHT striatum. Findings suggest Parkinsonian syndrome pathology.   Of note, DaTSCAN  is not diagnostic of Parkinsonian syndromes, which remains a clinical diagnosis. DaTscan  is an adjuvant test to aid in the clinical diagnosis of Parkinsonian syndromes. In addition, I personally and independently reviewed images through the PACS system.  She was notified of the test results by phone call.   She reports feeling about the same.  She is agreeable to starting medication for parkinsonism.  She is planning to stay active and working on weight loss.   She currently does not wish to proceed with a sleep study and requested her study appointment to be canceled. The patient's allergies,  current medications, family history, past medical history, past social history, past surgical history and problem list were reviewed and updated as appropriate.       04/18/2024: (She) reports an approximately 2 year history of tremor affecting primarily her left upper extremity, less so on the right.  She reports a change in her handwriting particularly trembling but  not necessarily a smaller handwriting.  She has no family history of tremors or Parkinson's disease.  She does drink quite a bit of caffeine in the form of coffee, about 32 ounces per day and diet soda occasionally, maybe up to 3 times a day and she tries to hydrate well with water.  She has a history of paroxysmal A-fib.  She had a sleep study several years ago but reports no history of sleep apnea.  She snores per husband.  She has sleep disruption, she has nocturia at least 2 or 3 times per average night.  She sleeps on her stomach.  She does not drink any alcohol . I reviewed your office note from 02/13/2024.  She is on a beta-blocker, namely metoprolol .  He is on multiple medications, she sees Dr. Letha for her back pain and prior to that had seen Dr. Leopoldo with neurology.  She was on narcotic pain medication with oxycodone  as needed, but is no longer on it, after her back surgery in October 2024.  She does take Flexeril  3 times a day as needed and gabapentin  as needed. She had blood work on 01/31/2024 including TSH which was normal at 3.26.    She has gained weight after her back surgery since she has not been as active.  She has had a change in her posture.  She walks very slowly.  She has a walker but does not use it all the time.   Her Past Medical History Is Significant For: Past Medical History:  Diagnosis Date   Allergy    Anxiety    Arthritis    Back pain    Chronic edema    Chronic hepatitis C without hepatic coma (HCC) 10/29/2012   Chronic pain syndrome 07/01/2018   Cirrhosis (HCC) 05/10/2017   Class 2 severe obesity with serious comorbidity and body mass index (BMI) of 37.0 to 37.9 in adult 10/25/2018   Cold hands and feet    Complication of anesthesia    difficulty waking up/dizzy/lightheaded   Controlled type 2 diabetes mellitus without complication, without long-term current use of insulin  (HCC) 07/11/2018   Cough    Dry eye syndrome of both eyes 09/02/2022   Dysrhythmia,  cardiac 09/28/2011   She has no records with her today. She tells me that a few years ago in [place] she had palpitations and a cardiology placed her on digoxin , she does not know what type of dysrhythmia. She has not had any palpitations recently     Environmental allergies    Eye pain    Fatigue    Gastroesophageal reflux disease 11/05/2015   H/O varicella    H/O varicose veins    Hepatic cirrhosis (HCC)    Hepatitis C    History of breast cancer 01/15/2020   History of cardiac arrhythmia 01/15/2020   History of chemotherapy 2013   left breast cancer   History of hepatitis C 01/15/2020   History of left hip replacement 08/29/2018   History of measles, mumps, or rubella    History of osteoporosis 01/15/2020   History of total right hip replacement 08/29/2018   History of TTP (  thrombotic thrombocytopenic purpura) 10/29/2012   Hx of radiation therapy 12/15/11 - 01/29/12   left breast   Hyperlipidemia    Hypokalemia 12/29/2013   Hypothyroidism    Intrinsic atopic dermatitis 01/15/2020   Irregular heart beat    under control   Joint pain    Leg cramps    Lymphedema of arm 12/15/2011   Malignant neoplasm of axillary tail of left breast in female, estrogen receptor positive (HCC) 08/12/2015   Malignant neoplasm of upper-outer quadrant of left breast in female, estrogen receptor positive (HCC) 07/17/2014   Moderate persistent asthma without complication 01/15/2020   Neuromuscular disorder (HCC)    left foot nerve damage- neuropathy    Oral candidiasis 09/02/2022   Osteopenia    Other allergic rhinitis 10/29/2012   Palpitations    Prediabetes 10/25/2018   Radiculopathy 07/2020   Rheumatoid arthritis (HCC)    Ringing in ear    Rosacea, acne 09/28/2011   Severe persistent asthma without complication (HCC) 09/02/2022   Status post bilateral hip replacements 10/29/2012   Swallowing difficulty    Swelling of both lower extremities    Thrombocytopenia, primary (HCC)    TTP (thrombotic  thrombocytopenic purpura) (HCC)    1982   Vitamin D  deficiency 10/25/2018    Her Past Surgical History Is Significant For: Past Surgical History:  Procedure Laterality Date   BREAST LUMPECTOMY  08/03/11   left lumpectomy and slnbx,T1cN0,triple pos   CATARACT EXTRACTION, BILATERAL     COLONOSCOPY  01/26/2019   COLONOSCOPY  08/12/2020   elbow pins Left    FOOT SURGERY Left    multiple   POLYPECTOMY     PORTACATH PLACEMENT  08/03/2011   Procedure: INSERTION PORT-A-CATH;  Surgeon: Deward GORMAN Curvin DOUGLAS, MD;  Location: [facility] OR;  Service: General;  Laterality: Right;   portacath removal     THORACIC DISCECTOMY Left 06/21/2023   Procedure: Laminectomy and Foraminotomy - left - Thoracic eleven-Thoracic twelve;  Surgeon: Louis Shove, MD;  Location: [facility] OR;  Service: Neurosurgery;  Laterality: Left;   TOTAL HIP ARTHROPLASTY Bilateral    UPPER GASTROINTESTINAL ENDOSCOPY     UPPER GASTROINTESTINAL ENDOSCOPY  08/12/2020    Her Family History Is Significant For: Family History  Problem Relation Age of Onset   Pneumonia Mother    COPD Mother    Colon polyps Mother    Heart disease Mother    Thyroid  disease Mother    Obesity Mother    COPD Father    Obesity Father    Brain cancer Maternal Grandfather    Alcohol  abuse Maternal Grandfather    Hyperlipidemia Neg Hx    Hypertension Neg Hx    Colon cancer Neg Hx    Rectal cancer Neg Hx    Stomach cancer Neg Hx    Esophageal cancer Neg Hx    Parkinson's disease Neg Hx     Her Social History Is Significant For: Social History   Socioeconomic History   Marital status: Married    Spouse name: Tim   Number of children: 0   Years of education: Not on file   Highest education level: Bachelor's degree (e.g., BA, AB, BS)  Occupational History   Occupation: Retired  Tobacco Use   Smoking status: Never   Smokeless tobacco: Never  Vaping Use   Vaping status: Never Used  Substance and Sexual Activity   Alcohol  use: Yes    Alcohol /week: 1.0  standard drink of alcohol     Types: 1 Glasses of wine per  week    Comment: occ   Drug use: No   Sexual activity: Not on file  Other Topics Concern   Not on file  Social History Narrative   Pt lives with husband    Retired    Caffeine 24oz cup a day    Social Drivers of Health   Tobacco Use: Low Risk (09/12/2024)   Patient History    Smoking Tobacco Use: Never    Smokeless Tobacco Use: Never    Passive Exposure: Not on file  Financial Resource Strain: Low Risk (02/08/2024)   Overall Financial Resource Strain (CARDIA)    Difficulty of Paying Living Expenses: Not hard at all  Food Insecurity: No Food Insecurity (02/08/2024)   Hunger Vital Sign    Worried About Running Out of Food in the Last Year: Never true    Ran Out of Food in the Last Year: Never true  Transportation Needs: No Transportation Needs (02/08/2024)   PRAPARE - Administrator, Civil Service (Medical): No    Lack of Transportation (Non-Medical): No  Physical Activity: Insufficiently Active (02/08/2024)   Exercise Vital Sign    Days of Exercise per Week: 2 days    Minutes of Exercise per Session: 30 min  Stress: No Stress Concern Present (02/08/2024)   [employer] of Occupational Health - Occupational Stress Questionnaire    Feeling of Stress : Only a little  Social Connections: Moderately Isolated (02/08/2024)   Social Connection and Isolation Panel    Frequency of Communication with Friends and Family: Once a week    Frequency of Social Gatherings with Friends and Family: Once a week    Attends Religious Services: 1 to 4 times per year    Active Member of [employer] or Organizations: No    Attends Banker Meetings: Never    Marital Status: Married  Depression (PHQ2-9): Low Risk (08/17/2024)   Depression (PHQ2-9)    PHQ-2 Score: 4  Alcohol  Screen: Low Risk (02/08/2024)   Alcohol  Screen    Last Alcohol  Screening Score (AUDIT): 1  Housing: Low Risk (02/08/2024)   Housing Stability Vital  Sign    Unable to Pay for Housing in the Last Year: No    Number of Times Moved in the Last Year: 0    Homeless in the Last Year: No  Utilities: Not At Risk (02/08/2024)   [facility] Utilities    Threatened with loss of utilities: No  Health Literacy: Adequate Health Literacy (02/08/2024)   B1300 Health Literacy    Frequency of need for help with medical instructions: Never    Her Allergies Are:  Allergies[1]:   Her Current Medications Are:  Outpatient Encounter Medications as of 09/12/2024  Medication Sig   acetaminophen  (TYLENOL ) 500 MG tablet Take 500 mg by mouth every 6 (six) hours as needed for mild pain, moderate pain or headache.    ADVAIR  HFA 45-21 MCG/ACT inhaler Inhale 2 puffs into the lungs 2 (two) times daily. (Patient taking differently: Inhale 2 puffs into the lungs 2 (two) times daily. As needed)   albuterol  (VENTOLIN  HFA) 108 (90 Base) MCG/ACT inhaler Two puffs every 4 hours if needed for wheezing or coughing .  May use 2 puffs 5-15 minutes prior to exercise.   azelastine  (ASTELIN ) 0.1 % nasal spray Place 1-2 sprays into both nostrils 2 (two) times daily as needed.   carbidopa -levodopa  (SINEMET  IR) 25-100 MG tablet Take 1 tablet by mouth 3 (three) times daily. Take at 9 AM, 1 PM  and 7 PM daily.   clobetasol  ointment (TEMOVATE ) 0.05 % Apply 1 Application topically 2 (two) times daily.   famotidine  (PEPCID ) 20 MG tablet TAKE 1 TABLET BY MOUTH 2 TIMES A DAY; KEEP APPOINTMENT FOR FURTHER REFILLS   fexofenadine  (ALLEGRA ) 180 MG tablet Take 1 tablet (180 mg total) by mouth daily.   fluticasone  (FLONASE  SENSIMIST) 27.5 MCG/SPRAY nasal spray Place 2 sprays into the nose daily.   Glucosamine-Chondroitin (MOVE FREE PO) Take 1 tablet by mouth in the morning. (Patient taking differently: Take 1 tablet by mouth in the morning. As needed)   levothyroxine  (SYNTHROID ) 88 MCG tablet Take 1 tablet (88 mcg total) by mouth daily.   magnesium  oxide (MAG-OX) 400 (240 Mg) MG tablet Take 400 mg by mouth  in the morning.   metFORMIN  (GLUCOPHAGE ) 500 MG tablet Take 1 tablet (500 mg total) by mouth 2 (two) times daily with a meal.   montelukast  (SINGULAIR ) 10 MG tablet TAKE 1 TABLET BY MOUTH AT BEDTIME   nystatin  (MYCOSTATIN ) 100000 UNIT/ML suspension Use as directed 5 mLs in the mouth or throat 4 (four) times daily as needed (thrush/irritation (due to inhaler use)).   omeprazole  (PRILOSEC ) 20 MG capsule TAKE 1 TABLET BY MOUTH ONCE DAILY   Polyethyl Glycol-Propyl Glycol (SYSTANE) 0.4-0.3 % SOLN Place 1-2 drops into both eyes 3 (three) times daily as needed (dry/irritated eyes.).   rosuvastatin  (CRESTOR ) 5 MG tablet Take 1 tablet (5 mg total) by mouth daily.   Spacer/Aero-Holding Chambers (AEROCHAMBER MV) inhaler Use as instructed   triamcinolone  ointment (KENALOG ) 0.1 % Apply 1 Application topically 2 (two) times daily.   VITAMIN D , CHOLECALCIFEROL, PO Take 10,000 Units by mouth daily.   [DISCONTINUED] benzonatate  (TESSALON ) 100 MG capsule Take 1 capsule (100 mg total) by mouth 3 (three) times daily as needed. (Patient not taking: Reported on 05/17/2024)   [DISCONTINUED] Probiotic Product (PROBIOTIC PO) Take 1 capsule by mouth in the morning. (Patient not taking: Reported on 05/17/2024)   Facility-Administered Encounter Medications as of 09/12/2024  Medication   benralizumab  (FASENRA ) prefilled syringe 30 mg  :  Review of Systems:  Out of a complete 14 point review of systems, all are reviewed and negative with the exception of these symptoms as listed below:   Review of Systems  Objective:  Neurological Exam  Physical Exam Physical Examination:   Vitals:   09/12/24 1039  BP: 136/89  Pulse: 76    General Examination: The patient is a very pleasant 69 y.o. female in no acute distress. She appears well-developed and well-nourished and well groomed.   HEENT: Normocephalic, atraumatic, pupils are equal, round and reactive to light, extraocular tracking is fairly well-preserved.  Hearing is  grossly intact.  Face with mild facial masking, mild nuchal rigidity noted, stable.  Intermittent lower jaw and lip tremor noted which also appear to be stable.  Speech slightly raspy but otherwise without dysarthria, mild hypophonia noted.  Oropharynx exam reveals stable findings, mild mouth dryness.  Tongue protrudes centrally and palate elevates symmetrically.   Chest: Clear to auscultation without wheezing, rhonchi or crackles noted.   Heart: S1+S2+0, regular and normal without murmurs, rubs or gallops noted.    Abdomen: Soft, non-tender and non-distended.   Extremities: There is significant swelling in keeping with lymphedema in the left upper extremity and significant swelling in both lower extremities.  Appears stable.   Skin: Warm and dry without trophic changes noted.    Musculoskeletal: exam reveals limited range of motion left elbow, limited range of  motion left foot and both legs.     Neurologically:  Mental status: The patient is awake, alert and oriented in all 4 spheres. Her immediate and remote memory, attention, language skills and fund of knowledge are appropriate. There is no evidence of aphasia, agnosia, apraxia or anomia. Speech is clear with normal prosody and enunciation. Thought process is linear. Mood is normal and affect is normal, mildly anxious appearing.  Cranial nerves II - XII are as described above under HEENT exam.  Motor exam: Normal bulk, global strength of at least 4+ out of 5, no obvious focal weakness noted.  Mildly increased tone in both upper extremities in the wrist, stable.  She has an intermittent resting tremor in the left upper extremity, no significant resting tremor in the left lower or right sided extremities.  She has a mild action tremor.  She has no intention tremor.    (On 04/18/2024: on Archimedes spiral drawing she has trembling with the left hand, no significant trembling with the right hand, mild insecurity noted.  Handwriting with the right  hand is tremulous, somewhat on the smaller side and legible.)    Fine motor skills and coordination: She has difficulty with fine motor skills with finger taps and hand movements, left worse than right, left side moderately impaired, right side mild to moderately impaired.  Foot taps are mild to moderately impaired bilaterally, left slightly worse compared to right.    Cerebellar testing: No dysmetria or intention tremor. There is no truncal or gait ataxia.  Sensory exam: intact to light touch in the upper and lower extremities.  Gait, station and balance: She stands with mild difficulty, requires no assistance.  She walks with a rolling walker.    Assessment and Plan:  In summary, DANAIJA ESKRIDGE is a 69 year old female with an underlying complex and complicated medical history of asthma, hypothyroidism, reflux disease, diabetes, hyperlipidemia, history of breast cancer with status post surgery and chemoradiation, anxiety, depression, history of hepatitis C in the context of blood transfusion, with a history of TTP, chronic liver disease, chronic back pain, rheumatoid arthritis, status post multiple surgeries including breast lumpectomy, cataract extractions, left foot surgery, polypectomy, bilateral total hip arthroplasties, chronic edema, chronic pain syndrome, and severe obesity with a BMI of over 40, who presents for follow-up consultation of her parkinsonism, likely left-sided predominant Parkinson's disease with symptoms dating back to about 2-1/2 years at this point.  She has been on low-dose levodopa  therapy since September 2025 and is currently taking half a pill 3 times daily with good tolerance with the exception of mild intermittent dizziness.  She is willing to gradually increase her levodopa  to 1 pill 3 times daily.  She can do this in 1 pill increments over the next couple of weeks if she wants to go slow.  We talked about fall prevention and lifestyle modification and maintaining good  nutrition, good hydration, as well as good activity levels.  She is doing good generally.  She is advised to monitor her swallowing issues.  If she has more consistent issues or repeated issues with dysphagia, I would like to get her evaluated with speech therapy.  She is currently declining a referral and would like to monitor her symptoms.  We have previously talked about her risk for sleep apnea but she has declined sleep testing.  We will plan a follow-up in this office for her to see one of our nurse practitioners routinely in about 6 to 7 months, sooner if needed.  I answered all their questions today and the patient and her husband were in agreement.   I spent 30 minutes in total face-to-face time and in reviewing records during pre-charting, more than 50% of which was spent in counseling and coordination of care, reviewing test results, reviewing medications and treatment regimen and/or in discussing or reviewing the diagnosis of primary parkinsonism, the prognosis and treatment options. Pertinent laboratory and imaging test results that were available during this visit with the patient were reviewed by me and considered in my medical decision making (see chart for details).      [1]  Allergies Allergen Reactions   Aspirin Other (See Comments)    Pt has a hx of TTP.    Nsaids Other (See Comments)    Pt has a hx of TTP.

## 2024-09-13 ENCOUNTER — Ambulatory Visit: Admitting: Occupational Therapy

## 2024-09-13 ENCOUNTER — Encounter: Payer: Self-pay | Admitting: Occupational Therapy

## 2024-09-13 DIAGNOSIS — R29818 Other symptoms and signs involving the nervous system: Secondary | ICD-10-CM | POA: Diagnosis not present

## 2024-09-13 DIAGNOSIS — M25612 Stiffness of left shoulder, not elsewhere classified: Secondary | ICD-10-CM

## 2024-09-13 DIAGNOSIS — M25622 Stiffness of left elbow, not elsewhere classified: Secondary | ICD-10-CM

## 2024-09-13 DIAGNOSIS — M25611 Stiffness of right shoulder, not elsewhere classified: Secondary | ICD-10-CM

## 2024-09-13 DIAGNOSIS — M25621 Stiffness of right elbow, not elsewhere classified: Secondary | ICD-10-CM

## 2024-09-13 DIAGNOSIS — R278 Other lack of coordination: Secondary | ICD-10-CM

## 2024-09-13 NOTE — Therapy (Addendum)
 " OUTPATIENT OCCUPATIONAL THERAPY [facility]  Patient Name: Tara [name] MRN: [id number] DOB:December 27, 1955, 69 y.o., female Today's Date: 09/19/2024  PCP: Dr. Perri REFERRING PROVIDER: Dr. Perri  END OF SESSION:  OT End of Session - 09/19/24 0813     Visit Number 7    Number of Visits 25    Date for Recertification  10/12/24    Authorization Type Medicare & A[employer]    Authorization - Visit Number 7    Authorization - Number of Visits 10    Progress Note Due on Visit 10    OT Start Time 1018    OT Stop Time 1100    OT Time Calculation (min) 42 min    Activity Tolerance Patient tolerated treatment well    Behavior During Therapy WFL for tasks assessed/performed               Past Medical History:  Diagnosis Date   Allergy    Anxiety    Arthritis    Back pain    Chronic edema    Chronic hepatitis C without hepatic coma (HCC) 10/29/2012   Chronic pain syndrome 07/01/2018   Cirrhosis (HCC) 05/10/2017   Class 2 severe obesity with serious comorbidity and body mass index (BMI) of 37.0 to 37.9 in adult 10/25/2018   Cold hands and feet    Complication of anesthesia    difficulty waking up/dizzy/lightheaded   Controlled type 2 diabetes mellitus without complication, without long-term current use of insulin  (HCC) 07/11/2018   Cough    Dry eye syndrome of both eyes 09/02/2022   Dysrhythmia, cardiac 09/28/2011   She has no records with her today. She tells me that a few years ago in [place] she had palpitations and a cardiology placed her on digoxin , she does not know what type of dysrhythmia. She has not had any palpitations recently     Environmental allergies    Eye pain    Fatigue    Gastroesophageal reflux disease 11/05/2015   H/O varicella    H/O varicose veins    Hepatic cirrhosis (HCC)    Hepatitis C    History of breast cancer 01/15/2020   History of cardiac arrhythmia 01/15/2020   History of chemotherapy 2013   left breast cancer   History of hepatitis C  01/15/2020   History of left hip replacement 08/29/2018   History of measles, mumps, or rubella    History of osteoporosis 01/15/2020   History of total right hip replacement 08/29/2018   History of TTP (thrombotic thrombocytopenic purpura) 10/29/2012   Hx of radiation therapy 12/15/11 - 01/29/12   left breast   Hyperlipidemia    Hypokalemia 12/29/2013   Hypothyroidism    Intrinsic atopic dermatitis 01/15/2020   Irregular heart beat    under control   Joint pain    Leg cramps    Lymphedema of arm 12/15/2011   Malignant neoplasm of axillary tail of left breast in female, estrogen receptor positive (HCC) 08/12/2015   Malignant neoplasm of upper-outer quadrant of left breast in female, estrogen receptor positive (HCC) 07/17/2014   Moderate persistent asthma without complication 01/15/2020   Neuromuscular disorder (HCC)    left foot nerve damage- neuropathy    Oral candidiasis 09/02/2022   Osteopenia    Other allergic rhinitis 10/29/2012   Palpitations    Prediabetes 10/25/2018   Radiculopathy 07/2020   Rheumatoid arthritis (HCC)    Ringing in ear    Rosacea, acne 09/28/2011   Severe persistent asthma  without complication (HCC) 09/02/2022   Status post bilateral hip replacements 10/29/2012   Swallowing difficulty    Swelling of both lower extremities    Thrombocytopenia, primary (HCC)    TTP (thrombotic thrombocytopenic purpura) (HCC)    1982   Vitamin D  deficiency 10/25/2018   Past Surgical History:  Procedure Laterality Date   BREAST LUMPECTOMY  08/03/11   left lumpectomy and slnbx,T1cN0,triple pos   CATARACT EXTRACTION, BILATERAL     COLONOSCOPY  01/26/2019   COLONOSCOPY  08/12/2020   elbow pins Left    FOOT SURGERY Left    multiple   POLYPECTOMY     PORTACATH PLACEMENT  08/03/2011   Procedure: INSERTION PORT-A-CATH;  Surgeon: Deward GORMAN Curvin DOUGLAS, MD;  Location: [facility] OR;  Service: General;  Laterality: Right;   portacath removal     THORACIC DISCECTOMY Left 06/21/2023    Procedure: Laminectomy and Foraminotomy - left - Thoracic eleven-Thoracic twelve;  Surgeon: Louis Shove, MD;  Location: [facility] OR;  Service: Neurosurgery;  Laterality: Left;   TOTAL HIP ARTHROPLASTY Bilateral    UPPER GASTROINTESTINAL ENDOSCOPY     UPPER GASTROINTESTINAL ENDOSCOPY  08/12/2020   Patient Active Problem List   Diagnosis Date Noted   Thoracic radiculopathy 06/21/2023   PSVT (paroxysmal supraventricular tachycardia) (HCC) asymptomatic short runs on event monitor September 2024 06/03/2023   Mitral regurgitation 06/03/2023   Preoperative cardiovascular examination 06/03/2023   Allergy    Anxiety    Back pain    Chronic edema    Cold hands and feet    Complication of anesthesia    Environmental allergies    Eye pain    Fatigue    H/O varicella    H/O varicose veins    Hepatic cirrhosis (HCC)    Hepatitis C    History of measles, mumps, or rubella    Hyperlipidemia    Irregular heart beat    Joint pain    Leg cramps    Neuromuscular disorder (HCC)    Osteopenia    Palpitations    Rheumatoid arthritis (HCC)    Ringing in ear    Swallowing difficulty    Thrombocytopenia, primary (HCC)    TTP (thrombotic thrombocytopenic purpura) (HCC)    Severe persistent asthma without complication (HCC) 09/02/2022   Oral candidiasis 09/02/2022   Dry eye syndrome of both eyes 09/02/2022   Radiculopathy 07/2020   Moderate persistent asthma without complication 01/15/2020   History of cardiac arrhythmia 01/15/2020   History of breast cancer 01/15/2020   History of osteoporosis 01/15/2020   Intrinsic atopic dermatitis 01/15/2020   History of hepatitis C 01/15/2020   Cough 01/15/2020   Arthritis 11/10/2019   Prediabetes 10/25/2018   Class 2 severe obesity with serious comorbidity and body mass index (BMI) of 37.0 to 37.9 in adult 10/25/2018   Vitamin D  deficiency 10/25/2018   History of left hip replacement 08/29/2018   History of total right hip replacement 08/29/2018    Controlled type 2 diabetes mellitus without complication, without long-term current use of insulin  (HCC) 07/11/2018   Chronic pain syndrome 07/01/2018   Cirrhosis (HCC) 05/10/2017   Gastroesophageal reflux disease 11/05/2015   Malignant neoplasm of axillary tail of left breast in female, estrogen receptor positive (HCC) 08/12/2015   Malignant neoplasm of upper-outer quadrant of left breast in female, estrogen receptor positive (HCC) 07/17/2014   Hypokalemia 12/29/2013   Other allergic rhinitis 10/29/2012   Status post bilateral hip replacements 10/29/2012   History of TTP (thrombotic thrombocytopenic purpura) 10/29/2012  Chronic hepatitis C without hepatic coma (HCC) 10/29/2012   Hx of radiation therapy    Lymphedema of arm 12/15/2011   Rosacea, acne 09/28/2011   Dysrhythmia, cardiac 09/28/2011   Hypothyroidism 09/07/2011   History of chemotherapy 2013    ONSET DATE: 05/29/24- referral date  REFERRING DIAG:  Diagnosis  G20.A1 (ICD-10-CM) - Parkinson's disease, unspecified whether dyskinesia present, unspecified whether manifestations fluctuate (HCC)    THERAPY DIAG:  Other symptoms and signs involving the nervous system  Stiffness of left shoulder, not elsewhere classified  Stiffness of right shoulder, not elsewhere classified  Other lack of coordination  Stiffness of left elbow, not elsewhere classified  Stiffness of right elbow, not elsewhere classified  Rationale for Evaluation and Treatment: Rehabilitation  SUBJECTIVE:   SUBJECTIVE STATEMENT: Pt reports that she saw Dr. Buck and she increased her medication  Pt accompanied by: significant other  PERTINENT HISTORY: Hx per chart- 69 year old female with  complicated medical history of asthma, hypothyroidism, reflux disease, diabetes, hyperlipidemia, history of breast cancer with status postsurgery and chemoradiation, anxiety, depression, history of hepatitis C in the context of blood transfusion, with a history of  TTP, chronic liver disease, chronic back pain, rheumatoid arthritis, status post multiple surgeries including breast, back,  lumpectomy, cataract extractions, left foot surgery, polypectomy, bilateral total hip arthroplasties, chronic edema, chronic pain syndrome, and severe obesity with a BMI of over 40,  MD recommends starting carbidopa / levadopa 3x daily   Dat scan results IMPRESSION: Bilateral decreased radiotracer activity within LEFT and RIGHT striatum. Findings suggest Parkinsonian syndrome pathology.  PRECAUTIONS: Fall, lymphedema, hx of breast CA, hx of back surgery 1 year ago, hx of hep C  WEIGHT BEARING RESTRICTIONS: No  PAIN:  PAIN:  no pain  FALLS: Has patient fallen in last 6 months? No  LIVING ENVIRONMENT: Lives with: lives with their spouse Lives in: House/apartment   PLOF: Needs assistance with ADLs and Needs assistance with homemaking  PATIENT GOALS: manage tremors  OBJECTIVE:  Note: Objective measures were completed at Evaluation unless otherwise noted.  HAND DOMINANCE: Right  ADLs: Overall ADLs: increased time required Transfers/ambulation related to ADLs: Eating: mod I Grooming: mod I UB Dressing: needs assist donning shirt, jacket and adjusting bra straps LB Dressing: wears special slip on shoes  Toileting: wears pad for incontinence,  Bathing:mod I  Tub Shower transfers: walk in shower and tub, mod I   IADLs:  Handwriting: 75% legible and Mild micrographia  MOBILITY STATUS: Independent, small steps, decreased pace  POSTURE COMMENTS:  rounded shoulders and forward head  ACTIVITY TOLERANCE: Activity tolerance: decreased activity tolerance, fatigues quickly  FUNCTIONAL OUTCOME MEASURES: Fastening/unfastening 3 buttons: 45.67 Physical performance test: PPT#2 (simulated eating) 16.09 & PPT#4 (donning/doffing jacket): unable  COORDINATION: 9 Hole Peg test: Right: 30.87 sec; Left: 42.91 sec Box and Blocks:  Right 44blocks, Left 39  blocks Tremors: Resting  UE ROM:  shoulder flexion: RUE 90, LUE 70, elbow extension: RUE -20, LUE -45 hx of elbow fx s/p surgery, elbow flexion 140 Pt with significant lymphedema in LUE s/p breast CA    SENSATION: Not tested    COGNITION: Overall cognitive status: to be [facility] for taks performed, further assess in a functional context  OBSERVATIONS: Bradykinesia and bilateral resting tremor left greater than right  TREATMENT DATE:  09/13/24- Disscussion with pt/husband regarding how everything is going at home. Pt reports difficulty holding a measuring spoon with LUE while pouring spices with RUE.  Therapist recommends that pt considers keeping spice jar on countertop, and holds base with LUE while scooping with RUE. Standing to scoop items from bowl with RUE, min v.c to choke down on the spoon Standing to donn/ doff jacket like a cape dressing  LUE first, min-mod v.c and min facilitation, pt with improved performance follow practice second trial. PWR !hands basic 4 min v.c for amplitude Fastening and unfastening buttons with adapted strategy, min v.c  Chest press with foam noodle in seated. Ambulating to waiting room with new rollator, min v.c for big steps 3 button/ unbutton:28.55 secs  09/09/23:   In supine, PWR! Up and closed-chain chest press and shoulder flex with foam noodle with min facilitation and cueing for large amplitude and elbow ext and shoulder positioning.  Supine>sitting after education regarding large amplitude movement strategies and avoid pulling with UE due to risk of injury/pain.   Sitting, PWR! Up with min cueing for large amplitude movements.    Discussed activities pt is performing with personal trainer.  Recommended against overhead weights (now only doing rows with theraband) and avoid exercises that cause pain.  Recommended pt discuss difficulty  sleeping with neurologist.  Pt demo difficulty with voice when initially laid down, but denied difficulty breathing.    Checked Box and blocks with LUE--see goals section below.  Writing 1 sentence with good legibility/size with printing.  Then in cursive with min decr in size, but good legibility.  Reviewed strategies (moving wrist, built-up grip, lined paper, fill up space/write big, PWR! Hands).    Pt educated in the following ADLs strategies to incr ease/safety and decr risk of future complications:   sit>stand (large amplitude strategies with feet apart and forward lean), supine>sit (walking over with feet, roll, and push to sit),  donning/doffing jacket x1 (doffed with min-mod cueing and donned with min A)--incorporating trunk rotation/looking at hand, positioning of shoulder/UE with reaching and use of head turns/vision with reaching   08/21/24-PWR! hands basic 4, 5-10 reps each, min v.c for positioning, amplitude  followed by flipping, dealing and stacking coins with big movements min v.c Pt practiced carrying a plate in right and left hands, min v.c for techniques Therapist recommends pt has assistance if she goes to a buffet for Christmas so she does not drop her plate. Pt was shown the walker tray attachement and she practiced ambulating and using to carry a plate and water bottle 20 ft. Pt practiced writing with foam grip and min-mod v.c for techniques. Mod micrographia initally then pt wrote 3 sentences with good legibility and min decrease in letter size. Pt doffed vest without assist today, min A mod v.c to donn  08/14/24 PWR! up supine, closed chain chest press, min v.c Rolling like a log with PWR! twist to sit up. PWR! up, rock and step in seated mod v.c  rest break in between exercises. Donning vest with big movements min v.c / assist. Pt arrived with her walker today and she demonstrates improved mobility. Pt to show her trainer copies of exercises.  08/14/24 PWR! up supine,  closed chain chest press, min v.c Rolling like a log with PWR! twist to sit up. PWR! up, rock and step in seated mod v.c  rest break in between exercises. Donning vest with big movments min v.c / assist. Pt arrived with her walker  today and she demonstrates improved mobility. Pt to show her trainer copies of exercises.  08/07/24-Supine PWR! up followed by closed chain chest press and shoulder flexion,min- v.c and facilitation  Low range seated chest press and shoulder flexion, min v.c Standing PWR! step at countertip close supervision/ minguard and min-mod v.c  Marching at countertop, min v.c, PWR! up for sit to stand Beginning coordination and PWR! hands, see pt instructions min-mod v.c   07/24/24-Supine PWR! up followed by closed chain chest press and shoulder flexion,min-mod v.c and facilitation  Pt performed shoulder abduction followed by clap overhead in supine, min-mod v.c    07/20/24- eval    PATIENT EDUCATION: Education details:  ADL strategies: handwriting recommendations review, sit>stand, supine>sit,  donning/doffing jacket, positioning of shoulder/UE with reaching and use of head turns/vision Person educated: Patient and Spouse Education method: Explanation, demonstration Education comprehension: verbalized understanding, returned demonstration, verbal cues required, and needs further education  HOME EXERCISE PROGRAM: supine cane, PWR! hands, flipping and dealing cards 11/24 09/09/23:  ADL strategies: handwriting recommendations review, sit>stand, supine>sit,  donning/doffing jacket, positioning of shoulder/UE with reaching and use of head turns/vision  GOALS: Goals reviewed with patient? Yes  SHORT TERM GOALS: Target date: 08/19/24  I with HEP Baseline: Goal status:  ongoing needs reinforcement 08/22/24, 09/09/23  2.  I with adapted strategies/ AE to maximize pt's safety and I with ADLs/IADLS. Baseline:  Goal status: ongoing 08/14/24, needs reinforcement  09/13/24  3.  Pt will demonstrate improved LUE functional use as evidenced by increasing box/blocks by 3 blocks Baseline: R 44, L 39 Goal status:    Ongoing.  09/09/23:  L-38 blocks  4.  Pt will demonstrate improved ease with feeding as evidenced by decreasing PPT#2 to 13 secs or less. Baseline: 16.09 sec Goal status: ongoing  09/13/24  5.  Pt will demonstrate ability to write a sentence with 100% legibility and minimimal decrease in letter size. Goal status: 09/09/23  Met today at this level (printing)   LONG TERM GOALS: Target date: 10/12/24  Pt will demonstrate improved fine motor coordination for ADLs as evidenced by decreasing 9 hole peg test score for LUE by 3 secs. Baseline: R 30.87, L 42.91 Goal status: INITIAL  2.  Pt will demonstrate improved ease with fastening buttons as evidenced by decreasing 3 button/ unbutton score to 42 secs or less. Baseline:  Goal status: met, 09/13/24 28.55 secs  3.  Pt will verbalize understanding of community resources and ways to prevent PD related complications. Baseline:  Goal status: INITIAL  4.  Pt will demonstrate ability to retrieve a lightweight object at 100 shoulder flexion with -15 elbow extension with RUE.  Goal status: INITIAL  5.  Pt will demonstrate ability to retrieve a lightweight object at 80 shoulder flexion with LUE Goal status: INITIAL  ASSESSMENT:  CLINICAL IMPRESSION: Patient is progressing towards goals. Pt demonstrates improved ability to donn jacket and fasten buttons today. PERFORMANCE DEFICITS: in functional skills including ADLs, IADLs, coordination, dexterity, edema, ROM, strength, flexibility, Fine motor control, Gross motor control, mobility, balance, endurance, decreased knowledge of precautions, and decreased knowledge of use of DME,  and psychosocial skills including coping strategies, environmental adaptation, habits, interpersonal interactions, and routines and behaviors.   IMPAIRMENTS: are limiting patient  from ADLs, IADLs, rest and sleep, play, leisure, and social participation.   COMORBIDITIES:  may have co-morbidities  that affects occupational performance. Patient will benefit from skilled OT to address above impairments and improve overall function.  MODIFICATION OR ASSISTANCE TO  COMPLETE EVALUATION: No modification of tasks or assist necessary to complete an evaluation.  OT OCCUPATIONAL PROFILE AND HISTORY: Detailed assessment: Review of records and additional review of physical, cognitive, psychosocial history related to current functional performance.  CLINICAL DECISION MAKING: Moderate - several treatment options, min-mod task modification necessary  REHAB POTENTIAL: Good  EVALUATION COMPLEXITY: Low    PLAN:  OT FREQUENCY: 2x/week plus eval  OT DURATION: 12 weeks  PLANNED INTERVENTIONS: 97168 OT Re-evaluation, 97535 self care/ADL training, 02889 therapeutic exercise, 97530 therapeutic activity, 97112 neuromuscular re-education, 97140 manual therapy, 97113 aquatic therapy, 97035 ultrasound, 97018 paraffin, 02989 moist heat, 97010 cryotherapy, 97750 Physical Performance Testing, 02239 Orthotic Initial, 97763 Orthotic/Prosthetic subsequent, passive range of motion, balance training, functional mobility training, visual/perceptual remediation/compensation, psychosocial skills training, energy conservation, coping strategies training, patient/family education, and DME and/or AE instructions  RECOMMENDED OTHER SERVICES: Pt may benefit from PT in the future for lymphadema  CONSULTED AND AGREED WITH PLAN OF CARE: Patient and family member/caregiver  PLAN FOR NEXT SESSION: issue foam grips for self feeding, practice feeding, add to HEP for coordination   Shawnna Pancake, OTR/L 09/19/2024, 8:13 AM   "

## 2024-09-15 ENCOUNTER — Ambulatory Visit

## 2024-09-15 ENCOUNTER — Ambulatory Visit: Admitting: Occupational Therapy

## 2024-09-15 DIAGNOSIS — J4550 Severe persistent asthma, uncomplicated: Secondary | ICD-10-CM

## 2024-09-19 ENCOUNTER — Ambulatory Visit: Admitting: Occupational Therapy

## 2024-09-19 ENCOUNTER — Ambulatory Visit

## 2024-09-19 DIAGNOSIS — R278 Other lack of coordination: Secondary | ICD-10-CM

## 2024-09-19 DIAGNOSIS — R29818 Other symptoms and signs involving the nervous system: Secondary | ICD-10-CM | POA: Diagnosis not present

## 2024-09-19 DIAGNOSIS — M25621 Stiffness of right elbow, not elsewhere classified: Secondary | ICD-10-CM

## 2024-09-19 DIAGNOSIS — M25611 Stiffness of right shoulder, not elsewhere classified: Secondary | ICD-10-CM

## 2024-09-19 DIAGNOSIS — M25612 Stiffness of left shoulder, not elsewhere classified: Secondary | ICD-10-CM

## 2024-09-19 DIAGNOSIS — M25622 Stiffness of left elbow, not elsewhere classified: Secondary | ICD-10-CM

## 2024-09-19 NOTE — Therapy (Signed)
 " OUTPATIENT OCCUPATIONAL THERAPY [facility]  Patient Name: Tara [name] MRN: [id number] DOB:24-Apr-1956, 69 y.o., female Today's Date: 09/19/2024  PCP: Dr. Perri REFERRING PROVIDER: Dr. Perri  END OF SESSION:  OT End of Session - 09/19/24 1245     Visit Number 8    Number of Visits 25    Date for Recertification  10/12/24    Authorization Type Medicare & [employer]    Authorization - Visit Number 8    Authorization - Number of Visits 10    Progress Note Due on Visit 10    OT Start Time 1228    OT Stop Time 1310    OT Time Calculation (min) 42 min               Past Medical History:  Diagnosis Date   Allergy    Anxiety    Arthritis    Back pain    Chronic edema    Chronic hepatitis C without hepatic coma (HCC) 10/29/2012   Chronic pain syndrome 07/01/2018   Cirrhosis (HCC) 05/10/2017   Class 2 severe obesity with serious comorbidity and body mass index (BMI) of 37.0 to 37.9 in adult 10/25/2018   Cold hands and feet    Complication of anesthesia    difficulty waking up/dizzy/lightheaded   Controlled type 2 diabetes mellitus without complication, without long-term current use of insulin  (HCC) 07/11/2018   Cough    Dry eye syndrome of both eyes 09/02/2022   Dysrhythmia, cardiac 09/28/2011   She has no records with her today. She tells me that a few years ago in [place] she had palpitations and a cardiology placed her on digoxin , she does not know what type of dysrhythmia. She has not had any palpitations recently     Environmental allergies    Eye pain    Fatigue    Gastroesophageal reflux disease 11/05/2015   H/O varicella    H/O varicose veins    Hepatic cirrhosis (HCC)    Hepatitis C    History of breast cancer 01/15/2020   History of cardiac arrhythmia 01/15/2020   History of chemotherapy 2013   left breast cancer   History of hepatitis C 01/15/2020   History of left hip replacement 08/29/2018   History of measles, mumps, or rubella    History of  osteoporosis 01/15/2020   History of total right hip replacement 08/29/2018   History of TTP (thrombotic thrombocytopenic purpura) 10/29/2012   Hx of radiation therapy 12/15/11 - 01/29/12   left breast   Hyperlipidemia    Hypokalemia 12/29/2013   Hypothyroidism    Intrinsic atopic dermatitis 01/15/2020   Irregular heart beat    under control   Joint pain    Leg cramps    Lymphedema of arm 12/15/2011   Malignant neoplasm of axillary tail of left breast in female, estrogen receptor positive (HCC) 08/12/2015   Malignant neoplasm of upper-outer quadrant of left breast in female, estrogen receptor positive (HCC) 07/17/2014   Moderate persistent asthma without complication 01/15/2020   Neuromuscular disorder (HCC)    left foot nerve damage- neuropathy    Oral candidiasis 09/02/2022   Osteopenia    Other allergic rhinitis 10/29/2012   Palpitations    Prediabetes 10/25/2018   Radiculopathy 07/2020   Rheumatoid arthritis (HCC)    Ringing in ear    Rosacea, acne 09/28/2011   Severe persistent asthma without complication (HCC) 09/02/2022   Status post bilateral hip replacements 10/29/2012   Swallowing difficulty  Swelling of both lower extremities    Thrombocytopenia, primary (HCC)    TTP (thrombotic thrombocytopenic purpura) (HCC)    1982   Vitamin D  deficiency 10/25/2018   Past Surgical History:  Procedure Laterality Date   BREAST LUMPECTOMY  08/03/11   left lumpectomy and slnbx,T1cN0,triple pos   CATARACT EXTRACTION, BILATERAL     COLONOSCOPY  01/26/2019   COLONOSCOPY  08/12/2020   elbow pins Left    FOOT SURGERY Left    multiple   POLYPECTOMY     PORTACATH PLACEMENT  08/03/2011   Procedure: INSERTION PORT-A-CATH;  Surgeon: Deward GORMAN Curvin DOUGLAS, MD;  Location: [facility] OR;  Service: General;  Laterality: Right;   portacath removal     THORACIC DISCECTOMY Left 06/21/2023   Procedure: Laminectomy and Foraminotomy - left - Thoracic eleven-Thoracic twelve;  Surgeon: Louis Shove, MD;   Location: [facility] OR;  Service: Neurosurgery;  Laterality: Left;   TOTAL HIP ARTHROPLASTY Bilateral    UPPER GASTROINTESTINAL ENDOSCOPY     UPPER GASTROINTESTINAL ENDOSCOPY  08/12/2020   Patient Active Problem List   Diagnosis Date Noted   Thoracic radiculopathy 06/21/2023   PSVT (paroxysmal supraventricular tachycardia) (HCC) asymptomatic short runs on event monitor September 2024 06/03/2023   Mitral regurgitation 06/03/2023   Preoperative cardiovascular examination 06/03/2023   Allergy    Anxiety    Back pain    Chronic edema    Cold hands and feet    Complication of anesthesia    Environmental allergies    Eye pain    Fatigue    H/O varicella    H/O varicose veins    Hepatic cirrhosis (HCC)    Hepatitis C    History of measles, mumps, or rubella    Hyperlipidemia    Irregular heart beat    Joint pain    Leg cramps    Neuromuscular disorder (HCC)    Osteopenia    Palpitations    Rheumatoid arthritis (HCC)    Ringing in ear    Swallowing difficulty    Thrombocytopenia, primary (HCC)    TTP (thrombotic thrombocytopenic purpura) (HCC)    Severe persistent asthma without complication (HCC) 09/02/2022   Oral candidiasis 09/02/2022   Dry eye syndrome of both eyes 09/02/2022   Radiculopathy 07/2020   Moderate persistent asthma without complication 01/15/2020   History of cardiac arrhythmia 01/15/2020   History of breast cancer 01/15/2020   History of osteoporosis 01/15/2020   Intrinsic atopic dermatitis 01/15/2020   History of hepatitis C 01/15/2020   Cough 01/15/2020   Arthritis 11/10/2019   Prediabetes 10/25/2018   Class 2 severe obesity with serious comorbidity and body mass index (BMI) of 37.0 to 37.9 in adult 10/25/2018   Vitamin D  deficiency 10/25/2018   History of left hip replacement 08/29/2018   History of total right hip replacement 08/29/2018   Controlled type 2 diabetes mellitus without complication, without long-term current use of insulin  (HCC) 07/11/2018    Chronic pain syndrome 07/01/2018   Cirrhosis (HCC) 05/10/2017   Gastroesophageal reflux disease 11/05/2015   Malignant neoplasm of axillary tail of left breast in female, estrogen receptor positive (HCC) 08/12/2015   Malignant neoplasm of upper-outer quadrant of left breast in female, estrogen receptor positive (HCC) 07/17/2014   Hypokalemia 12/29/2013   Other allergic rhinitis 10/29/2012   Status post bilateral hip replacements 10/29/2012   History of TTP (thrombotic thrombocytopenic purpura) 10/29/2012   Chronic hepatitis C without hepatic coma (HCC) 10/29/2012   Hx of radiation therapy  Lymphedema of arm 12/15/2011   Rosacea, acne 09/28/2011   Dysrhythmia, cardiac 09/28/2011   Hypothyroidism 09/07/2011   History of chemotherapy 2013    ONSET DATE: 05/29/24- referral date  REFERRING DIAG:  Diagnosis  G20.A1 (ICD-10-CM) - Parkinson's disease, unspecified whether dyskinesia present, unspecified whether manifestations fluctuate (HCC)    THERAPY DIAG:  Other symptoms and signs involving the nervous system  Stiffness of left shoulder, not elsewhere classified  Stiffness of right shoulder, not elsewhere classified  Other lack of coordination  Stiffness of left elbow, not elsewhere classified  Stiffness of right elbow, not elsewhere classified  Rationale for Evaluation and Treatment: Rehabilitation  SUBJECTIVE:   SUBJECTIVE STATEMENT: Pt reports she doing a little better  Pt accompanied by: significant other  PERTINENT HISTORY: Hx per chart- 69 year old female with  complicated medical history of asthma, hypothyroidism, reflux disease, diabetes, hyperlipidemia, history of breast cancer with status postsurgery and chemoradiation, anxiety, depression, history of hepatitis C in the context of blood transfusion, with a history of TTP, chronic liver disease, chronic back pain, rheumatoid arthritis, status post multiple surgeries including breast, back,  lumpectomy, cataract  extractions, left foot surgery, polypectomy, bilateral total hip arthroplasties, chronic edema, chronic pain syndrome, and severe obesity with a BMI of over 40,  MD recommends starting carbidopa / levadopa 3x daily   Dat scan results IMPRESSION: Bilateral decreased radiotracer activity within LEFT and RIGHT striatum. Findings suggest Parkinsonian syndrome pathology.  PRECAUTIONS: Fall, lymphedema, hx of breast CA, hx of back surgery 1 year ago, hx of hep C  WEIGHT BEARING RESTRICTIONS: No  PAIN: PAIN:  Are you having pain? Yes: NPRS scale: LUE Pain location: 4/10 LUE Pain description: aching Aggravating factors: high reaching Relieving factors: repositioning    FALLS: Has patient fallen in last 6 months? No  LIVING ENVIRONMENT: Lives with: lives with their spouse Lives in: House/apartment   PLOF: Needs assistance with ADLs and Needs assistance with homemaking  PATIENT GOALS: manage tremors  OBJECTIVE:  Note: Objective measures were completed at Evaluation unless otherwise noted.  HAND DOMINANCE: Right  ADLs: Overall ADLs: increased time required Transfers/ambulation related to ADLs: Eating: mod I Grooming: mod I UB Dressing: needs assist donning shirt, jacket and adjusting bra straps LB Dressing: wears special slip on shoes  Toileting: wears pad for incontinence,  Bathing:mod I  Tub Shower transfers: walk in shower and tub, mod I   IADLs:  Handwriting: 75% legible and Mild micrographia  MOBILITY STATUS: Independent, small steps, decreased pace  POSTURE COMMENTS:  rounded shoulders and forward head  ACTIVITY TOLERANCE: Activity tolerance: decreased activity tolerance, fatigues quickly  FUNCTIONAL OUTCOME MEASURES: Fastening/unfastening 3 buttons: 45.67 Physical performance test: PPT#2 (simulated eating) 16.09 & PPT#4 (donning/doffing jacket): unable  COORDINATION: 9 Hole Peg test: Right: 30.87 sec; Left: 42.91 sec Box and Blocks:  Right 44blocks, Left  39 blocks Tremors: Resting  UE ROM:  shoulder flexion: RUE 90, LUE 70, elbow extension: RUE -20, LUE -45 hx of elbow fx s/p surgery, elbow flexion 140 Pt with significant lymphedema in LUE s/p breast CA    SENSATION: Not tested    COGNITION: Overall cognitive status: to be [facility] for taks performed, further assess in a functional context  OBSERVATIONS: Bradykinesia and bilateral resting tremor left greater than right  TREATMENT DATE:  09/19/24- Supine PWR! up then chest press with foam roll, min v.c Attempted shoulder flexion however it was painful so transitioned to seated low range shoulder flexion and chest press with foam roll, min-mod v.c Seated PWR! step at corner of mat and seated marching mod v.c for LLE amplitude. Pt reports difficulty with car transfers due to difficulty moving leg. Standing at countertop modified quadraped rock in mid- range, then dynamic step and reach to left and right sides to flip playing cards.  foam grips issued for self feeding. Practice donning/ doffing jacket with big movements dressing LUE first, donning like a cape mod v.c and encouragement needed. Pt continues to fatigue quickly and she requires rest breaks.  09/13/24- Disscussion with pt/husband regarding how everything is going at home. Pt reports difficulty holding a measuring spoon with LUE while pouring spices with RUE.  Therapist recommends that pt considers keeping spice jar on countertop, and holds base with LUE while scooping with RUE. Standing to scoop items from bowl with RUE, min v.c to choke down on the spoon Standing to donn/ doff jacket like a cape dressing  LUE first, min-mod v.c and min facilitation, pt with improved performance follow practice second trial. PWR !hands basic 4 min v.c for amplitude Fastening and unfastening buttons with adapted strategy, min v.c  Chest  press with foam noodle in seated. Ambulating to waiting room with new rollator, min v.c for big steps 3 button/ unbutton:28.55 secs  09/09/23:   In supine, PWR! Up and closed-chain chest press and shoulder flex with foam noodle with min facilitation and cueing for large amplitude and elbow ext and shoulder positioning.  Supine>sitting after education regarding large amplitude movement strategies and avoid pulling with UE due to risk of injury/pain.   Sitting, PWR! Up with min cueing for large amplitude movements.    Discussed activities pt is performing with personal trainer.  Recommended against overhead weights (now only doing rows with theraband) and avoid exercises that cause pain.  Recommended pt discuss difficulty sleeping with neurologist.  Pt demo difficulty with voice when initially laid down, but denied difficulty breathing.    Checked Box and blocks with LUE--see goals section below.  Writing 1 sentence with good legibility/size with printing.  Then in cursive with min decr in size, but good legibility.  Reviewed strategies (moving wrist, built-up grip, lined paper, fill up space/write big, PWR! Hands).    Pt educated in the following ADLs strategies to incr ease/safety and decr risk of future complications:   sit>stand (large amplitude strategies with feet apart and forward lean), supine>sit (walking over with feet, roll, and push to sit),  donning/doffing jacket x1 (doffed with min-mod cueing and donned with min A)--incorporating trunk rotation/looking at hand, positioning of shoulder/UE with reaching and use of head turns/vision with reaching   08/21/24-PWR! hands basic 4, 5-10 reps each, min v.c for positioning, amplitude  followed by flipping, dealing and stacking coins with big movements min v.c Pt practiced carrying a plate in right and left hands, min v.c for techniques Therapist recommends pt has assistance if she goes to a buffet for Christmas so she does not drop her  plate. Pt was shown the walker tray attachement and she practiced ambulating and using to carry a plate and water bottle 20 ft. Pt practiced writing with foam grip and min-mod v.c for techniques. Mod micrographia initally then pt wrote 3 sentences with good legibility and min decrease in letter size. Pt doffed vest without assist  today, min A mod v.c to donn  08/14/24 PWR! up supine, closed chain chest press, min v.c Rolling like a log with PWR! twist to sit up. PWR! up, rock and step in seated mod v.c  rest break in between exercises. Donning vest with big movements min v.c / assist. Pt arrived with her walker today and she demonstrates improved mobility. Pt to show her trainer copies of exercises.  08/14/24 PWR! up supine, closed chain chest press, min v.c Rolling like a log with PWR! twist to sit up. PWR! up, rock and step in seated mod v.c  rest break in between exercises. Donning vest with big movments min v.c / assist. Pt arrived with her walker today and she demonstrates improved mobility. Pt to show her trainer copies of exercises.  08/07/24-Supine PWR! up followed by closed chain chest press and shoulder flexion,min- v.c and facilitation  Low range seated chest press and shoulder flexion, min v.c Standing PWR! step at countertip close supervision/ minguard and min-mod v.c  Marching at countertop, min v.c, PWR! up for sit to stand Beginning coordination and PWR! hands, see pt instructions min-mod v.c   07/24/24-Supine PWR! up followed by closed chain chest press and shoulder flexion,min-mod v.c and facilitation  Pt performed shoulder abduction followed by clap overhead in supine, min-mod v.c    07/20/24- eval    PATIENT EDUCATION: Education details: see above Person educated: Patient and Spouse Education method: Explanation, demonstration Education comprehension: verbalized understanding, returned demonstration, verbal cues required, and needs further education  HOME  EXERCISE PROGRAM: supine cane, PWR! hands, flipping and dealing cards 11/24 09/09/23:  ADL strategies: handwriting recommendations review, sit>stand, supine>sit,  donning/doffing jacket, positioning of shoulder/UE with reaching and use of head turns/vision  GOALS: Goals reviewed with patient? Yes  SHORT TERM GOALS: Target date: 08/19/24  I with HEP Baseline: Goal status:  met for low range foam noodle shoulder flexion, chest press 09/19/24  2.  I with adapted strategies/ AE to maximize pt's safety and I with ADLs/IADLS. Baseline:  Goal status: ongoing 08/14/24, needs reinforcement 09/13/24  3.  Pt will demonstrate improved LUE functional use as evidenced by increasing box/blocks by 3 blocks Baseline: R 44, L 39 Goal status:    Ongoing.  09/09/23:  L-38 blocks  4.  Pt will demonstrate improved ease with feeding as evidenced by decreasing PPT#2 to 13 secs or less. Baseline: 16.09 sec Goal status: ongoing  13.54 secs  09/19/24  5.  Pt will demonstrate ability to write a sentence with 100% legibility and minimimal decrease in letter size. Goal status: 09/09/23  Met today at this level (printing)   LONG TERM GOALS: Target date: 10/12/24  Pt will demonstrate improved fine motor coordination for ADLs as evidenced by decreasing 9 hole peg test score for LUE by 3 secs. Baseline: R 30.87, L 42.91 Goal status: INITIAL  2.  Pt will demonstrate improved ease with fastening buttons as evidenced by decreasing 3 button/ unbutton score to 42 secs or less. Baseline:  Goal status: met, 09/13/24 28.55 secs  3.  Pt will verbalize understanding of community resources and ways to prevent PD related complications. Baseline:  Goal status: INITIAL  4.  Pt will demonstrate ability to retrieve a lightweight object at 100 shoulder flexion with -15 elbow extension with RUE.  Goal status: INITIAL  5.  Pt will demonstrate ability to retrieve a lightweight object at 80 shoulder flexion with LUE Goal status:  INITIAL  ASSESSMENT:  CLINICAL IMPRESSION: Patient is progressing  towards goals. Pt demonstrates improved self feeding PPT#2 using foam grip. Pt continues to require verbal cues and encouragement for ADL tasks. PERFORMANCE DEFICITS: in functional skills including ADLs, IADLs, coordination, dexterity, edema, ROM, strength, flexibility, Fine motor control, Gross motor control, mobility, balance, endurance, decreased knowledge of precautions, and decreased knowledge of use of DME,  and psychosocial skills including coping strategies, environmental adaptation, habits, interpersonal interactions, and routines and behaviors.   IMPAIRMENTS: are limiting patient from ADLs, IADLs, rest and sleep, play, leisure, and social participation.   COMORBIDITIES:  may have co-morbidities  that affects occupational performance. Patient will benefit from skilled OT to address above impairments and improve overall function.  MODIFICATION OR ASSISTANCE TO COMPLETE EVALUATION: No modification of tasks or assist necessary to complete an evaluation.  OT OCCUPATIONAL PROFILE AND HISTORY: Detailed assessment: Review of records and additional review of physical, cognitive, psychosocial history related to current functional performance.  CLINICAL DECISION MAKING: Moderate - several treatment options, min-mod task modification necessary  REHAB POTENTIAL: Good  EVALUATION COMPLEXITY: Low    PLAN:  OT FREQUENCY: 2x/week plus eval  OT DURATION: 12 weeks  PLANNED INTERVENTIONS: 97168 OT Re-evaluation, 97535 self care/ADL training, 02889 therapeutic exercise, 97530 therapeutic activity, 97112 neuromuscular re-education, 97140 manual therapy, 97113 aquatic therapy, 97035 ultrasound, 97018 paraffin, 02989 moist heat, 97010 cryotherapy, 97750 Physical Performance Testing, 02239 Orthotic Initial, 97763 Orthotic/Prosthetic subsequent, passive range of motion, balance training, functional mobility training, visual/perceptual  remediation/compensation, psychosocial skills training, energy conservation, coping strategies training, patient/family education, and DME and/or AE instructions  RECOMMENDED OTHER SERVICES: Pt may benefit from PT in the future for lymphadema  CONSULTED AND AGREED WITH PLAN OF CARE: Patient and family member/caregiver  PLAN FOR NEXT SESSION: issue coordination HEP, practice PWR!seated  stepping/  seated marching in place, pt with difficulty getting out of car   Gursimran Litaker, OTR/L 09/19/2024, 12:46 PM   "

## 2024-09-20 NOTE — Therapy (Signed)
 " OUTPATIENT OCCUPATIONAL THERAPY [facility] treatment  Patient Name: Tara [name] MRN: [id number] DOB:1955/10/10, 69 y.o., female Today's Date: 09/21/2024  PCP: Dr. Perri REFERRING PROVIDER: Dr. Perri  END OF SESSION:  OT End of Session - 09/21/24 1110     Visit Number 9    Number of Visits 25    Date for Recertification  10/12/24    Authorization Type Medicare & AARP    Authorization - Visit Number 9    Authorization - Number of Visits 10    Progress Note Due on Visit 10    OT Start Time 1105    OT Stop Time 1150    OT Time Calculation (min) 45 min    Activity Tolerance Patient tolerated treatment well    Behavior During Therapy [facility] for tasks assessed/performed                Past Medical History:  Diagnosis Date   Allergy    Anxiety    Arthritis    Back pain    Chronic edema    Chronic hepatitis C without hepatic coma (HCC) 10/29/2012   Chronic pain syndrome 07/01/2018   Cirrhosis (HCC) 05/10/2017   Class 2 severe obesity with serious comorbidity and body mass index (BMI) of 37.0 to 37.9 in adult 10/25/2018   Cold hands and feet    Complication of anesthesia    difficulty waking up/dizzy/lightheaded   Controlled type 2 diabetes mellitus without complication, without long-term current use of insulin  (HCC) 07/11/2018   Cough    Dry eye syndrome of both eyes 09/02/2022   Dysrhythmia, cardiac 09/28/2011   She has no records with her today. She tells me that a few years ago in [place] she had palpitations and a cardiology placed her on digoxin , she does not know what type of dysrhythmia. She has not had any palpitations recently     Environmental allergies    Eye pain    Fatigue    Gastroesophageal reflux disease 11/05/2015   H/O varicella    H/O varicose veins    Hepatic cirrhosis (HCC)    Hepatitis C    History of breast cancer 01/15/2020   History of cardiac arrhythmia 01/15/2020   History of chemotherapy 2013   left breast cancer   History of hepatitis  C 01/15/2020   History of left hip replacement 08/29/2018   History of measles, mumps, or rubella    History of osteoporosis 01/15/2020   History of total right hip replacement 08/29/2018   History of TTP (thrombotic thrombocytopenic purpura) 10/29/2012   Hx of radiation therapy 12/15/11 - 01/29/12   left breast   Hyperlipidemia    Hypokalemia 12/29/2013   Hypothyroidism    Intrinsic atopic dermatitis 01/15/2020   Irregular heart beat    under control   Joint pain    Leg cramps    Lymphedema of arm 12/15/2011   Malignant neoplasm of axillary tail of left breast in female, estrogen receptor positive (HCC) 08/12/2015   Malignant neoplasm of upper-outer quadrant of left breast in female, estrogen receptor positive (HCC) 07/17/2014   Moderate persistent asthma without complication 01/15/2020   Neuromuscular disorder (HCC)    left foot nerve damage- neuropathy    Oral candidiasis 09/02/2022   Osteopenia    Other allergic rhinitis 10/29/2012   Palpitations    Prediabetes 10/25/2018   Radiculopathy 07/2020   Rheumatoid arthritis (HCC)    Ringing in ear    Rosacea, acne 09/28/2011   Severe persistent  asthma without complication (HCC) 09/02/2022   Status post bilateral hip replacements 10/29/2012   Swallowing difficulty    Swelling of both lower extremities    Thrombocytopenia, primary (HCC)    TTP (thrombotic thrombocytopenic purpura) (HCC)    1982   Vitamin D  deficiency 10/25/2018   Past Surgical History:  Procedure Laterality Date   BREAST LUMPECTOMY  08/03/11   left lumpectomy and slnbx,T1cN0,triple pos   CATARACT EXTRACTION, BILATERAL     COLONOSCOPY  01/26/2019   COLONOSCOPY  08/12/2020   elbow pins Left    FOOT SURGERY Left    multiple   POLYPECTOMY     PORTACATH PLACEMENT  08/03/2011   Procedure: INSERTION PORT-A-CATH;  Surgeon: Deward GORMAN Curvin DOUGLAS, MD;  Location: [facility] OR;  Service: General;  Laterality: Right;   portacath removal     THORACIC DISCECTOMY Left 06/21/2023    Procedure: Laminectomy and Foraminotomy - left - Thoracic eleven-Thoracic twelve;  Surgeon: Louis Shove, MD;  Location: [facility] OR;  Service: Neurosurgery;  Laterality: Left;   TOTAL HIP ARTHROPLASTY Bilateral    UPPER GASTROINTESTINAL ENDOSCOPY     UPPER GASTROINTESTINAL ENDOSCOPY  08/12/2020   Patient Active Problem List   Diagnosis Date Noted   Thoracic radiculopathy 06/21/2023   PSVT (paroxysmal supraventricular tachycardia) (HCC) asymptomatic short runs on event monitor September 2024 06/03/2023   Mitral regurgitation 06/03/2023   Preoperative cardiovascular examination 06/03/2023   Allergy    Anxiety    Back pain    Chronic edema    Cold hands and feet    Complication of anesthesia    Environmental allergies    Eye pain    Fatigue    H/O varicella    H/O varicose veins    Hepatic cirrhosis (HCC)    Hepatitis C    History of measles, mumps, or rubella    Hyperlipidemia    Irregular heart beat    Joint pain    Leg cramps    Neuromuscular disorder (HCC)    Osteopenia    Palpitations    Rheumatoid arthritis (HCC)    Ringing in ear    Swallowing difficulty    Thrombocytopenia, primary (HCC)    TTP (thrombotic thrombocytopenic purpura) (HCC)    Severe persistent asthma without complication (HCC) 09/02/2022   Oral candidiasis 09/02/2022   Dry eye syndrome of both eyes 09/02/2022   Radiculopathy 07/2020   Moderate persistent asthma without complication 01/15/2020   History of cardiac arrhythmia 01/15/2020   History of breast cancer 01/15/2020   History of osteoporosis 01/15/2020   Intrinsic atopic dermatitis 01/15/2020   History of hepatitis C 01/15/2020   Cough 01/15/2020   Arthritis 11/10/2019   Prediabetes 10/25/2018   Class 2 severe obesity with serious comorbidity and body mass index (BMI) of 37.0 to 37.9 in adult 10/25/2018   Vitamin D  deficiency 10/25/2018   History of left hip replacement 08/29/2018   History of total right hip replacement 08/29/2018    Controlled type 2 diabetes mellitus without complication, without long-term current use of insulin  (HCC) 07/11/2018   Chronic pain syndrome 07/01/2018   Cirrhosis (HCC) 05/10/2017   Gastroesophageal reflux disease 11/05/2015   Malignant neoplasm of axillary tail of left breast in female, estrogen receptor positive (HCC) 08/12/2015   Malignant neoplasm of upper-outer quadrant of left breast in female, estrogen receptor positive (HCC) 07/17/2014   Hypokalemia 12/29/2013   Other allergic rhinitis 10/29/2012   Status post bilateral hip replacements 10/29/2012   History of TTP (thrombotic thrombocytopenic purpura)  10/29/2012   Chronic hepatitis C without hepatic coma (HCC) 10/29/2012   Hx of radiation therapy    Lymphedema of arm 12/15/2011   Rosacea, acne 09/28/2011   Dysrhythmia, cardiac 09/28/2011   Hypothyroidism 09/07/2011   History of chemotherapy 2013    ONSET DATE: 05/29/24- referral date  REFERRING DIAG:  Diagnosis  G20.A1 (ICD-10-CM) - Parkinson's disease, unspecified whether dyskinesia present, unspecified whether manifestations fluctuate (HCC)    THERAPY DIAG:  Other symptoms and signs involving the nervous system  Stiffness of left shoulder, not elsewhere classified  Stiffness of right shoulder, not elsewhere classified  Other lack of coordination  Stiffness of left elbow, not elsewhere classified  Stiffness of right elbow, not elsewhere classified  Rationale for Evaluation and Treatment: Rehabilitation  SUBJECTIVE:   SUBJECTIVE STATEMENT: Pt reports that she is getting ready to move to [place].  Pt reports that she is now taking full dose of PD meds 1 pill, 3x/day   Pt accompanied by: significant other  PERTINENT HISTORY: Hx per chart- 69 year old female with  complicated medical history of asthma, hypothyroidism, reflux disease, diabetes, hyperlipidemia, history of breast cancer with status postsurgery and chemoradiation, anxiety, depression, history of  hepatitis C in the context of blood transfusion, with a history of TTP, chronic liver disease, chronic back pain, rheumatoid arthritis, status post multiple surgeries including breast, back,  lumpectomy, cataract extractions, left foot surgery, polypectomy, bilateral total hip arthroplasties, chronic edema, chronic pain syndrome, and severe obesity with a BMI of over 40,  MD recommends starting carbidopa / levadopa 3x daily   Dat scan results IMPRESSION: Bilateral decreased radiotracer activity within LEFT and RIGHT striatum. Findings suggest Parkinsonian syndrome pathology.  PRECAUTIONS: Fall, lymphedema, hx of breast CA, hx of back surgery 1 year ago, hx of hep C  WEIGHT BEARING RESTRICTIONS: No  PAIN: PAIN:  Are you having pain? Yes: NPRS scale: LUE Pain location: 4/10 LUE Pain description: aching Aggravating factors: high reaching Relieving factors: repositioning    FALLS: Has patient fallen in last 6 months? No  LIVING ENVIRONMENT: Lives with: lives with their spouse Lives in: House/apartment   PLOF: Needs assistance with ADLs and Needs assistance with homemaking  PATIENT GOALS: manage tremors  OBJECTIVE:  Note: Objective measures were completed at Evaluation unless otherwise noted.  HAND DOMINANCE: Right  ADLs: Overall ADLs: increased time required Transfers/ambulation related to ADLs: Eating: mod I Grooming: mod I UB Dressing: needs assist donning shirt, jacket and adjusting bra straps LB Dressing: wears special slip on shoes  Toileting: wears pad for incontinence,  Bathing:mod I  Tub Shower transfers: walk in shower and tub, mod I  IADLs: Handwriting: 75% legible and Mild micrographia  MOBILITY STATUS: Independent, small steps, decreased pace  POSTURE COMMENTS:  rounded shoulders and forward head  ACTIVITY TOLERANCE: Activity tolerance: decreased activity tolerance, fatigues quickly  FUNCTIONAL OUTCOME MEASURES: Fastening/unfastening 3 buttons:  45.67 Physical performance test: PPT#2 (simulated eating) 16.09 & PPT#4 (donning/doffing jacket): unable  COORDINATION: 9 Hole Peg test: Right: 30.87 sec; Left: 42.91 sec Box and Blocks:  Right 44blocks, Left 39 blocks Tremors: Resting  UE ROM:  shoulder flexion: RUE 90, LUE 70, elbow extension: RUE -20, LUE -45 hx of elbow fx s/p surgery, elbow flexion 140 Pt with significant lymphedema in LUE s/p breast CA  SENSATION: Not tested  COGNITION: Overall cognitive status: to be [facility] for taks performed, further assess in a functional context  OBSERVATIONS: Bradykinesia and bilateral resting tremor left greater than right  TREATMENT DATE:   09/21/24:  Reviewed and re-issued coordination HEP, min cueing for incr movement amplitude--see pt instructions. Seated marching and stepping in/out to assist with car transfer/LB dressing.  Pt with significant difficulty with LLE.  Also instructed pt/husband in use of car cane to assist with car transfers--shown picture and explained/wrote down name. Discussed/provided general education regarding sleep and GI symptoms associated with PD to incr awareness.    09/19/24- Supine PWR! up then chest press with foam roll, min v.c Attempted shoulder flexion however it was painful so transitioned to seated low range shoulder flexion and chest press with foam roll, min-mod v.c Seated PWR! step at corner of mat and seated marching mod v.c for LLE amplitude. Pt reports difficulty with car transfers due to difficulty moving leg. Standing at countertop modified quadraped rock in mid- range, then dynamic step and reach to left and right sides to flip playing cards.  foam grips issued for self feeding. Practice donning/ doffing jacket with big movements dressing LUE first, donning like a cape mod v.c and encouragement needed. Pt continues to fatigue quickly and  she requires rest breaks.  09/13/24- Disscussion with pt/husband regarding how everything is going at home. Pt reports difficulty holding a measuring spoon with LUE while pouring spices with RUE.  Therapist recommends that pt considers keeping spice jar on countertop, and holds base with LUE while scooping with RUE. Standing to scoop items from bowl with RUE, min v.c to choke down on the spoon Standing to donn/ doff jacket like a cape dressing  LUE first, min-mod v.c and min facilitation, pt with improved performance follow practice second trial. PWR !hands basic 4 min v.c for amplitude Fastening and unfastening buttons with adapted strategy, min v.c  Chest press with foam noodle in seated. Ambulating to waiting room with new rollator, min v.c for big steps 3 button/ unbutton:28.55 secs  09/09/23:   In supine, PWR! Up and closed-chain chest press and shoulder flex with foam noodle with min facilitation and cueing for large amplitude and elbow ext and shoulder positioning.  Supine>sitting after education regarding large amplitude movement strategies and avoid pulling with UE due to risk of injury/pain.   Sitting, PWR! Up with min cueing for large amplitude movements.    Discussed activities pt is performing with personal trainer.  Recommended against overhead weights (now only doing rows with theraband) and avoid exercises that cause pain.  Recommended pt discuss difficulty sleeping with neurologist.  Pt demo difficulty with voice when initially laid down, but denied difficulty breathing.    Checked Box and blocks with LUE--see goals section below.  Writing 1 sentence with good legibility/size with printing.  Then in cursive with min decr in size, but good legibility.  Reviewed strategies (moving wrist, built-up grip, lined paper, fill up space/write big, PWR! Hands).    Pt educated in the following ADLs strategies to incr ease/safety and decr risk of future complications:   sit>stand (large  amplitude strategies with feet apart and forward lean), supine>sit (walking over with feet, roll, and push to sit),  donning/doffing jacket x1 (doffed with min-mod cueing and donned with min A)--incorporating trunk rotation/looking at hand, positioning of shoulder/UE with reaching and use of head turns/vision with reaching   08/21/24-PWR! hands basic 4, 5-10 reps each, min v.c for positioning, amplitude  followed by flipping, dealing and stacking coins with big movements min v.c Pt practiced carrying a plate in right and left hands, min v.c for techniques Therapist recommends pt has assistance  if she goes to a buffet for Christmas so she does not drop her plate. Pt was shown the walker tray attachement and she practiced ambulating and using to carry a plate and water bottle 20 ft. Pt practiced writing with foam grip and min-mod v.c for techniques. Mod micrographia initally then pt wrote 3 sentences with good legibility and min decrease in letter size. Pt doffed vest without assist today, min A mod v.c to donn  08/14/24 PWR! up supine, closed chain chest press, min v.c Rolling like a log with PWR! twist to sit up. PWR! up, rock and step in seated mod v.c  rest break in between exercises. Donning vest with big movements min v.c / assist. Pt arrived with her walker today and she demonstrates improved mobility. Pt to show her trainer copies of exercises.  08/14/24 PWR! up supine, closed chain chest press, min v.c Rolling like a log with PWR! twist to sit up. PWR! up, rock and step in seated mod v.c  rest break in between exercises. Donning vest with big movments min v.c / assist. Pt arrived with her walker today and she demonstrates improved mobility. Pt to show her trainer copies of exercises.  08/07/24-Supine PWR! up followed by closed chain chest press and shoulder flexion,min- v.c and facilitation  Low range seated chest press and shoulder flexion, min v.c Standing PWR! step at countertip  close supervision/ minguard and min-mod v.c  Marching at countertop, min v.c, PWR! up for sit to stand Beginning coordination and PWR! hands, see pt instructions min-mod v.c   07/24/24-Supine PWR! up followed by closed chain chest press and shoulder flexion,min-mod v.c and facilitation  Pt performed shoulder abduction followed by clap overhead in supine, min-mod v.c    07/20/24- eval    PATIENT EDUCATION: Education details: see above Person educated: Patient and Spouse Education method: Explanation and Handouts, demonstration Education comprehension: verbalized understanding, returned demonstration, verbal cues required, and needs further education  HOME EXERCISE PROGRAM: supine cane, PWR! hands, flipping and dealing cards 11/24 09/09/23:  ADL strategies: handwriting recommendations review, sit>stand, supine>sit,  donning/doffing jacket, positioning of shoulder/UE with reaching and use of head turns/vision  GOALS: Goals reviewed with patient? Yes  SHORT TERM GOALS: Target date: 08/19/24  I with HEP Baseline: Goal status:  met for low range foam noodle shoulder flexion, chest press 09/19/24, 09/21/24 met with coordination HEP  2.  I with adapted strategies/ AE to maximize pt's safety and I with ADLs/IADLS. Baseline:  Goal status: ongoing 08/14/24, needs reinforcement 09/13/24, 09/21/24  3.  Pt will demonstrate improved LUE functional use as evidenced by increasing box/blocks by 3 blocks Baseline: R 44, L 39 Goal status:    Ongoing.  09/09/23:  L-38 blocks  4.  Pt will demonstrate improved ease with feeding as evidenced by decreasing PPT#2 to 13 secs or less. Baseline: 16.09 sec Goal status: ongoing  13.54 secs  09/19/24  5.  Pt will demonstrate ability to write a sentence with 100% legibility and minimimal decrease in letter size. Goal status: 09/09/23  Met today at this level (printing)   LONG TERM GOALS: Target date: 10/12/24  Pt will demonstrate improved fine motor  coordination for ADLs as evidenced by decreasing 9 hole peg test score for LUE by 3 secs. Baseline: R 30.87, L 42.91 Goal status: Ongoing, 09/21/24  2.  Pt will demonstrate improved ease with fastening buttons as evidenced by decreasing 3 button/ unbutton score to 42 secs or less. Baseline:  Goal status: met, 09/13/24  28.55 secs  3.  Pt will verbalize understanding of community resources and ways to prevent PD related complications. Baseline:  Goal status: INITIAL  4.  Pt will demonstrate ability to retrieve a lightweight object at 100 shoulder flexion with -15 elbow extension with RUE. Goal status: INITIAL  5.  Pt will demonstrate ability to retrieve a lightweight object at 80 shoulder flexion with LUE Goal status: INITIAL  ASSESSMENT:  CLINICAL IMPRESSION: Pt is slowly progressing towards goals with improving coordination and returned demo coordination HEP today with min cueing for incr movement amplitude.   PERFORMANCE DEFICITS: in functional skills including ADLs, IADLs, coordination, dexterity, edema, ROM, strength, flexibility, Fine motor control, Gross motor control, mobility, balance, endurance, decreased knowledge of precautions, and decreased knowledge of use of DME,  and psychosocial skills including coping strategies, environmental adaptation, habits, interpersonal interactions, and routines and behaviors.   IMPAIRMENTS: are limiting patient from ADLs, IADLs, rest and sleep, play, leisure, and social participation.   COMORBIDITIES:  may have co-morbidities  that affects occupational performance. Patient will benefit from skilled OT to address above impairments and improve overall function.  MODIFICATION OR ASSISTANCE TO COMPLETE EVALUATION: No modification of tasks or assist necessary to complete an evaluation.  OT OCCUPATIONAL PROFILE AND HISTORY: Detailed assessment: Review of records and additional review of physical, cognitive, psychosocial history related to current  functional performance.  CLINICAL DECISION MAKING: Moderate - several treatment options, min-mod task modification necessary  REHAB POTENTIAL: Good  EVALUATION COMPLEXITY: Low    PLAN:  OT FREQUENCY: 2x/week plus eval  OT DURATION: 12 weeks  PLANNED INTERVENTIONS: 97168 OT Re-evaluation, 97535 self care/ADL training, 02889 therapeutic exercise, 97530 therapeutic activity, 97112 neuromuscular re-education, 97140 manual therapy, 97113 aquatic therapy, 97035 ultrasound, 97018 paraffin, 02989 moist heat, 97010 cryotherapy, 97750 Physical Performance Testing, 02239 Orthotic Initial, 97763 Orthotic/Prosthetic subsequent, passive range of motion, balance training, functional mobility training, visual/perceptual remediation/compensation, psychosocial skills training, energy conservation, coping strategies training, patient/family education, and DME and/or AE instructions  RECOMMENDED OTHER SERVICES: Pt may benefit from PT in the future for lymphadema  CONSULTED AND AGREED WITH PLAN OF CARE: Patient and family member/caregiver  PLAN FOR NEXT SESSION: continue to address functional reaching, coordination, and ADL strategies, ?issue seated PWR! step  Audria Takeshita, OTR/L 09/21/2024, 11:56 AM   "

## 2024-09-21 ENCOUNTER — Encounter: Payer: Self-pay | Admitting: Internal Medicine

## 2024-09-21 ENCOUNTER — Ambulatory Visit: Admitting: Occupational Therapy

## 2024-09-21 ENCOUNTER — Encounter: Payer: Self-pay | Admitting: Occupational Therapy

## 2024-09-21 DIAGNOSIS — M25621 Stiffness of right elbow, not elsewhere classified: Secondary | ICD-10-CM

## 2024-09-21 DIAGNOSIS — M25612 Stiffness of left shoulder, not elsewhere classified: Secondary | ICD-10-CM

## 2024-09-21 DIAGNOSIS — R29818 Other symptoms and signs involving the nervous system: Secondary | ICD-10-CM | POA: Diagnosis not present

## 2024-09-21 DIAGNOSIS — R278 Other lack of coordination: Secondary | ICD-10-CM

## 2024-09-21 DIAGNOSIS — M25611 Stiffness of right shoulder, not elsewhere classified: Secondary | ICD-10-CM

## 2024-09-21 DIAGNOSIS — M25622 Stiffness of left elbow, not elsewhere classified: Secondary | ICD-10-CM

## 2024-09-21 NOTE — Patient Instructions (Addendum)
 PWR! Hands   With arms stretched out in front of you (elbows straight), perform the following: PWR! Hands: Push hands out BIG. Elbows straight, wrists up, fingers open and spread apart BIG. (Can also perform by pushing down on table, chair, knees. Push above head, out to the side, behind you, in front of you.)  Perform at least 10 repetitions 1x/day, but perform PWR! hands throughout the day when you are having trouble using your hands (picking up/manipulating small objects, writing, eating, typing, sewing, buttoning, etc.).  Coordination Exercises   Perform the following exercises for 10 minutes 1 times per day. Perform with both hand(s). Perform using big movements.   Flipping Cards: Place deck of cards on the table. Flip cards over by opening your hand big to grasp and then turn your palm up big. Deal cards: Hold 1/2 or whole deck in your hand. Use thumb to push card off top of deck with one big push. Open hand big and then pick up coins and stack one at a time: Pick up with big, intentional movements. Do not drag coin to the edge. (5-10 in a stack) Pick up 5-10 coins one at a time and hold in palm. Then, move coins from palm to fingertips one at time and place in coin bank/container. Rotate ball in fingertips (each direction) Practice writing and/or typing

## 2024-09-26 ENCOUNTER — Ambulatory Visit: Admitting: Occupational Therapy

## 2024-09-28 ENCOUNTER — Ambulatory Visit: Admitting: Occupational Therapy

## 2024-10-03 ENCOUNTER — Encounter: Payer: Self-pay | Admitting: Occupational Therapy

## 2024-10-03 ENCOUNTER — Ambulatory Visit: Admitting: Occupational Therapy

## 2024-10-03 DIAGNOSIS — M25621 Stiffness of right elbow, not elsewhere classified: Secondary | ICD-10-CM

## 2024-10-03 DIAGNOSIS — M25612 Stiffness of left shoulder, not elsewhere classified: Secondary | ICD-10-CM

## 2024-10-03 DIAGNOSIS — R278 Other lack of coordination: Secondary | ICD-10-CM

## 2024-10-03 DIAGNOSIS — M25611 Stiffness of right shoulder, not elsewhere classified: Secondary | ICD-10-CM

## 2024-10-03 DIAGNOSIS — R29818 Other symptoms and signs involving the nervous system: Secondary | ICD-10-CM

## 2024-10-03 DIAGNOSIS — M25622 Stiffness of left elbow, not elsewhere classified: Secondary | ICD-10-CM

## 2024-10-04 ENCOUNTER — Other Ambulatory Visit

## 2024-10-04 ENCOUNTER — Encounter: Payer: Self-pay | Admitting: Internal Medicine

## 2024-10-04 ENCOUNTER — Ambulatory Visit: Admitting: Internal Medicine

## 2024-10-04 VITALS — BP 132/82 | HR 89 | Ht 66.0 in | Wt 269.5 lb

## 2024-10-04 DIAGNOSIS — K7689 Other specified diseases of liver: Secondary | ICD-10-CM

## 2024-10-04 DIAGNOSIS — Z8619 Personal history of other infectious and parasitic diseases: Secondary | ICD-10-CM | POA: Diagnosis not present

## 2024-10-04 DIAGNOSIS — K7460 Unspecified cirrhosis of liver: Secondary | ICD-10-CM

## 2024-10-04 DIAGNOSIS — Z860100 Personal history of colon polyps, unspecified: Secondary | ICD-10-CM

## 2024-10-04 DIAGNOSIS — K5900 Constipation, unspecified: Secondary | ICD-10-CM | POA: Diagnosis not present

## 2024-10-04 DIAGNOSIS — K219 Gastro-esophageal reflux disease without esophagitis: Secondary | ICD-10-CM | POA: Diagnosis not present

## 2024-10-04 DIAGNOSIS — K7402 Hepatic fibrosis, advanced fibrosis: Secondary | ICD-10-CM | POA: Diagnosis not present

## 2024-10-04 DIAGNOSIS — K3184 Gastroparesis: Secondary | ICD-10-CM

## 2024-10-04 LAB — PROTIME-INR
INR: 1 ratio (ref 0.8–1.0)
Prothrombin Time: 11.1 s (ref 9.6–13.1)

## 2024-10-04 MED ORDER — NA SULFATE-K SULFATE-MG SULF 17.5-3.13-1.6 GM/177ML PO SOLN: 1.0000 | Freq: Once | ORAL | 0 refills | Status: AC

## 2024-10-04 MED ORDER — ONDANSETRON 4 MG PO TBDP: ORAL_TABLET | ORAL | 0 refills | Status: AC

## 2024-10-04 NOTE — Progress Notes (Addendum)
 "  Chief Complaint: Cirrhosis, colon polyps  HPI : 69 year old female with history of HCV/MAFLD cirrhosis, Parkinson's disease, asthma, DM, obesity, osteoporosis, breast cancer, hypothyroidism, prior TTP presents for follow up of cirrhosis, colon polyps  Interval History: She used to have issues with N&V and diarrhea at the same time. Her last episode of N&V/diarrhea was 2 months ago. Denies blood in the stools. Lately she has had some abdominal discomfort in the LLQ, once every couple of weeks. That ab discomfort has been present for the last several months. Denies changes in bowel habits. Has a little bit of constipation. Endorses one BM once every few days on average. Denies dysphagia. Her levodopa  does make her nauseous. Her acid reflux has been well controlled on omeprazole . She was recently diagnosed with Parkinson's disease. Denies family history of GI issues. Denies alcohol  use. Denies confusion. Endorses swelling in her legs and LUE due to lymphedema. Swelling in her legs is longstanding and stable. Denies jaundice or scleral icterus  Past Medical History:  Diagnosis Date   Allergy    Anxiety    Arthritis    Back pain    Chronic edema    Chronic hepatitis C without hepatic coma (HCC) 10/29/2012   Chronic pain syndrome 07/01/2018   Cirrhosis (HCC) 05/10/2017   Class 2 severe obesity with serious comorbidity and body mass index (BMI) of 37.0 to 37.9 in adult 10/25/2018   Cold hands and feet    Complication of anesthesia    difficulty waking up/dizzy/lightheaded   Controlled type 2 diabetes mellitus without complication, without long-term current use of insulin  (HCC) 07/11/2018   Cough    Dry eye syndrome of both eyes 09/02/2022   Dysrhythmia, cardiac 09/28/2011   She has no records with her today. She tells me that a few years ago in [place] she had palpitations and a cardiology placed her on digoxin , she does not know what type of dysrhythmia. She has not had any palpitations recently      Environmental allergies    Eye pain    Fatigue    Gastroesophageal reflux disease 11/05/2015   H/O varicella    H/O varicose veins    Hepatic cirrhosis (HCC)    Hepatitis C    History of breast cancer 01/15/2020   History of cardiac arrhythmia 01/15/2020   History of chemotherapy 2013   left breast cancer   History of hepatitis C 01/15/2020   History of left hip replacement 08/29/2018   History of measles, mumps, or rubella    History of osteoporosis 01/15/2020   History of total right hip replacement 08/29/2018   History of TTP (thrombotic thrombocytopenic purpura) 10/29/2012   Hx of radiation therapy 12/15/11 - 01/29/12   left breast   Hyperlipidemia    Hypokalemia 12/29/2013   Hypothyroidism    Intrinsic atopic dermatitis 01/15/2020   Irregular heart beat    under control   Joint pain    Leg cramps    Lymphedema of arm 12/15/2011   Malignant neoplasm of axillary tail of left breast in female, estrogen receptor positive (HCC) 08/12/2015   Malignant neoplasm of upper-outer quadrant of left breast in female, estrogen receptor positive (HCC) 07/17/2014   Moderate persistent asthma without complication 01/15/2020   Neuromuscular disorder (HCC)    left foot nerve damage- neuropathy    Oral candidiasis 09/02/2022   Osteopenia    Other allergic rhinitis 10/29/2012   Palpitations    Parkinson's disease (HCC)    Prediabetes 10/25/2018  Radiculopathy 07/2020   Rheumatoid arthritis (HCC)    Ringing in ear    Rosacea, acne 09/28/2011   Severe persistent asthma without complication (HCC) 09/02/2022   Status post bilateral hip replacements 10/29/2012   Swallowing difficulty    Swelling of both lower extremities    Thrombocytopenia, primary (HCC)    TTP (thrombotic thrombocytopenic purpura) (HCC)    1982   Vitamin D  deficiency 10/25/2018     Past Surgical History:  Procedure Laterality Date   BREAST LUMPECTOMY  08/03/11   left lumpectomy and slnbx,T1cN0,triple pos    CATARACT EXTRACTION, BILATERAL     COLONOSCOPY  01/26/2019   COLONOSCOPY  08/12/2020   elbow pins Left    FOOT SURGERY Left    multiple   POLYPECTOMY     PORTACATH PLACEMENT  08/03/2011   Procedure: INSERTION PORT-A-CATH;  Surgeon: Deward GORMAN Curvin DOUGLAS, MD;  Location: [facility] OR;  Service: General;  Laterality: Right;   portacath removal     THORACIC DISCECTOMY Left 06/21/2023   Procedure: Laminectomy and Foraminotomy - left - Thoracic eleven-Thoracic twelve;  Surgeon: Louis Shove, MD;  Location: [facility] OR;  Service: Neurosurgery;  Laterality: Left;   TOTAL HIP ARTHROPLASTY Bilateral    UPPER GASTROINTESTINAL ENDOSCOPY     UPPER GASTROINTESTINAL ENDOSCOPY  08/12/2020   Family History  Problem Relation Age of Onset   Pneumonia Mother    COPD Mother    Colon polyps Mother    Heart disease Mother    Thyroid  disease Mother    Obesity Mother    COPD Father    Obesity Father    Brain cancer Maternal Grandfather    Alcohol  abuse Maternal Grandfather    Hyperlipidemia Neg Hx    Hypertension Neg Hx    Colon cancer Neg Hx    Rectal cancer Neg Hx    Stomach cancer Neg Hx    Esophageal cancer Neg Hx    Parkinson's disease Neg Hx    Social History[1] Current Outpatient Medications  Medication Sig Dispense Refill   acetaminophen  (TYLENOL ) 500 MG tablet Take 500 mg by mouth every 6 (six) hours as needed for mild pain, moderate pain or headache.      albuterol  (VENTOLIN  HFA) 108 (90 Base) MCG/ACT inhaler Two puffs every 4 hours if needed for wheezing or coughing .  May use 2 puffs 5-15 minutes prior to exercise. 18 g 1   azelastine  (ASTELIN ) 0.1 % nasal spray Place 1-2 sprays into both nostrils 2 (two) times daily as needed. 30 mL 5   carbidopa -levodopa  (SINEMET  IR) 25-100 MG tablet Take 1 tablet by mouth 3 (three) times daily. 270 tablet 3   clobetasol  ointment (TEMOVATE ) 0.05 % Apply 1 Application topically 2 (two) times daily. 30 g 0   famotidine  (PEPCID ) 20 MG tablet TAKE 1 TABLET BY MOUTH 2 TIMES A  DAY; KEEP APPOINTMENT FOR FURTHER REFILLS 180 tablet 0   fexofenadine  (ALLEGRA ) 180 MG tablet Take 1 tablet (180 mg total) by mouth daily. 90 tablet 1   fluticasone  (FLONASE  SENSIMIST) 27.5 MCG/SPRAY nasal spray Place 2 sprays into the nose daily. 10 g 12   Glucosamine-Chondroitin (MOVE FREE PO) Take 1 tablet by mouth in the morning.     levothyroxine  (SYNTHROID ) 88 MCG tablet Take 1 tablet (88 mcg total) by mouth daily. 90 tablet 1   Magnesium  400 MG CAPS Take 1 1e11 Vector Genomes by mouth daily.     magnesium  oxide (MAG-OX) 400 (240 Mg) MG tablet Take 400 mg by  mouth in the morning.     metFORMIN  (GLUCOPHAGE ) 500 MG tablet Take 1 tablet (500 mg total) by mouth 2 (two) times daily with a meal. 180 tablet 2   montelukast  (SINGULAIR ) 10 MG tablet TAKE 1 TABLET BY MOUTH AT BEDTIME 90 tablet 2   nystatin  (MYCOSTATIN ) 100000 UNIT/ML suspension Use as directed 5 mLs in the mouth or throat 4 (four) times daily as needed (thrush/irritation (due to inhaler use)).     omeprazole  (PRILOSEC ) 20 MG capsule TAKE 1 TABLET BY MOUTH ONCE DAILY 90 capsule 1   Polyethyl Glycol-Propyl Glycol (SYSTANE) 0.4-0.3 % SOLN Place 1-2 drops into both eyes 3 (three) times daily as needed (dry/irritated eyes.).     rosuvastatin  (CRESTOR ) 5 MG tablet Take 1 tablet (5 mg total) by mouth daily. 90 tablet 3   triamcinolone  ointment (KENALOG ) 0.1 % Apply 1 Application topically 2 (two) times daily. 30 g 0   VITAMIN D , CHOLECALCIFEROL, PO Take 10,000 Units by mouth daily.     ADVAIR  HFA 45-21 MCG/ACT inhaler Inhale 2 puffs into the lungs 2 (two) times daily. (Patient not taking: Reported on 10/04/2024) 1 each 5   Spacer/Aero-Holding Chambers (AEROCHAMBER MV) inhaler Use as instructed (Patient not taking: Reported on 10/04/2024) 1 each 2   Current Facility-Administered Medications  Medication Dose Route Frequency Provider Last Rate Last Admin   benralizumab  (FASENRA ) prefilled syringe 30 mg  30 mg Subcutaneous Q28 days Lorin Norris, MD   30 mg at 09/15/24 1019   Allergies[2]  Review of Systems: All systems reviewed and negative except where noted in HPI.   Physical Exam: BP 132/82   Pulse 89   Ht 5' 6 (1.676 m)   Wt 269 lb 8 oz (122.2 kg)   BMI 43.50 kg/m  Constitutional: Pleasant,well-developed, female in no acute distress. HEENT: Normocephalic and atraumatic. Conjunctivae are normal. No scleral icterus. Cardiovascular: Normal rate, regular rhythm.  Pulmonary/chest: Effort normal and breath sounds normal. No wheezing, rales or rhonchi. Abdominal: Soft, nondistended, nontender. Bowel sounds active throughout. There are no masses palpable. No hepatomegaly. Extremities: No edema Neurological: Alert and oriented to person place and time. Skin: Skin is warm and dry. No rashes noted. Psychiatric: Normal mood and affect. Behavior is normal.  Labs 07/2024: CBC with platelets of 147. CMP with mildly elevated Cr of 1.17. HbA1C mildly elevated at 5.7%. TSH nml.  MR Liver 10/29/21: IMPRESSION: 1. Hepatomegaly and numerous subcentimeter hepatic cysts. No suspicious hepatic mass visualized. 2. Multiple small renal cortical cysts. 3. Colonic diverticulosis.  Colonoscopy 08/12/20: Indication: Surveillance: History of piecemeal removal adenoma on last colonoscopy ( < 3 yrs) 35mm tubullovillous adenoma removed from the hepatic flexure in a piecemeal fashion 10/ 21/ 19 15mm tubular adenoma removed from the same location in a piecemeal fashion 2/ 14/ 20 4mm residual polyp removed from the same location 5/ 28/ 20 - Diverticulosis in the sigmoid colon and in the descending colon. - One 3 mm possible polyp in the descending colon, removed with a cold snare. Resected and retrieved. - One 3 mm polyp at the hepatic flexure, removed with a cold snare. Resected and retrieved. - The examination was otherwise normal on direct and retroflexion views. Path: 1. Surgical [P], colon, hepatic flexure, polyp (1) - TUBULAR ADENOMA (1 OF  8 FRAGMENTS) - BENIGN COLONIC MUCOSA (7 OF 8 FRAGMENTS) - NO HIGH GRADE DYSPLASIA OR MALIGNANCY IDENTIFIED 2. Surgical [P], colon, descending (possible polyp) (1) - INFLAMMATORY POLYP (1 OF 1 FRAGMENTS) - NO HIGH GRADE DYSPLASIA  OR MALIGNANCY IDENTIFIED  EGD 12/24/21: - The examined esophagus was normal. The veins in the mid and distal esophagus are prominent but there are no varices. - Two small sessile polyps with no bleeding and no stigmata of recent bleeding were found in the gastric body. Biopsies were taken with a cold forceps for histology. Estimated blood loss was minimal. - A hiatal hernia was present. There is no portal hypertensive gastropathy or gastric varices. - The examined duodenum was normal. Path: Surgical [P], colon, gastric polyp - biopsies, polyp (1) (biopsies only) - AMENDED DIAGNOSIS: - HYPERPLASTIC GASTRIC POLYP. -NO INTESTINAL METAPLASIA, ADENOMATOUS CHANGE OR MALIGNANCY  ASSESSMENT AND PLAN: Advanced fibrosis and possible early cirrhosis due to history of HCV +/- steatosis    - Elastography 04/28/2017: median velocity of 3.32 m/sec,         - corresponding to a fibrosis score of F3 to F4.    - has had the seasonal flu, Pneumovax, Twinrix, and Covid vaccines    - no history of decompensation, no varices on EGD 12/21 History of genotype 1a HCV with VL 1,370,000    - incomplete treatment with interferon due to associated TTP    - achieved SVR after 12 weeks of Harvoni  Gastroparesis presenting with intermittent nausea, vomiting, and diarrhea    - occurring monthly    - GES showed delayed gastric emptying 11/18/20    - symptoms resolved Stable portahepatis LAD Multiple stable liver cysts on prior abdominal imaging Complex kidney cyst on MRI 10/04/20 Normal, but low normal platelets History of colon polyps    - adenoma in 2007 on colonoscopy in [place]    - flexible sigmoidoscopy with Dr. Karyle 2014    - large hepatic flexure TVA removed in piecemeal fashion requiring  several exams    - concurrent TA, SSP, and inflammatory polyp removed in 2020 Constipation, one BM once every few days History of Schatzki's ring s/p dilation 2001  Hiatal hernia with reflux History of T1cN0 breast cancer Vitamin D  deficiency  Patient presents primarily to re-establish with GI for health care maintenance of her cirrhosis and history of colon polyps. Will update her labs, HCC screening, and EGD for variceal screening. Will also plan for a colonoscopy for history of colon polyps. I went over the risks (perforation, bleeding, infection, changes in vital signs) and benefits of the EGD and colonoscopy procedure with the patient, and she is agreeable to proceeding.   PLAN: - Check INR, AFP - Continue PPI - Abdominal ultrasound for HCC screening at [facility] - EGD/colonoscopy [facility] for variceal screening and history of colon polyps with two day prep. Will give some Zofran  to try to prevent nausea associated with prep - RTC in 6 months  Estefana Kidney, MD  I spent 43 minutes of time, including in depth chart review, independent review of results as outlined above, communicating results with the patient directly, face-to-face time with the patient, coordinating care, ordering studies and medications as appropriate, and documentation.     [1]  Social History Tobacco Use   Smoking status: Never   Smokeless tobacco: Never  Vaping Use   Vaping status: Never Used  Substance Use Topics   Alcohol  use: Yes    Alcohol /week: 1.0 standard drink of alcohol     Types: 1 Glasses of wine per week    Comment: occ   Drug use: No  [2]  Allergies Allergen Reactions   Aspirin Other (See Comments)    Pt has a hx of TTP.  Nsaids Other (See Comments)    Pt has a hx of TTP.    "

## 2024-10-04 NOTE — Patient Instructions (Signed)
 You have been scheduled for an abdominal ultrasound at [facility] on Saturday, 10-07-24 at 10:00 am. Please arrive 15 minutes prior to your appointment for registration. Make certain not to have anything to eat or drink 6 hours prior to your appointment. Should you need to reschedule your appointment, please contact radiology at [phone]. This test typically takes about 30 minutes to perform.  ______________________________________________________________________________  Please go to the lab in the basement of our building to have lab work done as you leave today. Hit B for basement when you get on the elevator.  When the doors open the lab is on your left.  We will call you with the results. Thank you. _______________________________________________________________________________  Rosine have been scheduled for an endoscopy and colonoscopy with Dr. Federico on 10-17-24. Please follow the written instructions given to you at your visit today.  If you use inhalers (even only as needed), please bring them with you on the day of your procedure.  DO NOT TAKE 7 DAYS PRIOR TO TEST- Trulicity (dulaglutide) Ozempic, Wegovy (semaglutide) Mounjaro, Zepbound (tirzepatide) Bydureon Bcise (exanatide extended release)  DO NOT TAKE 1 DAY PRIOR TO YOUR TEST Rybelsus (semaglutide) Adlyxin (lixisenatide) Victoza (liraglutide) Byetta (exanatide) ___________________________________________________________________________   We have sent the following medications to your pharmacy for you to pick up at your convenience: Zofran  4mg  ODT: dissolve 1 to 2 tablets orally 30 minutes before taking procedure prep, as needed  Thank you for entrusting me with your care and for choosing [facility], Dr. Estefana Federico   _______________________________________________________  If your blood pressure at your visit was 140/90 or greater, please contact your primary care physician to follow up on  this.  _______________________________________________________  If you are age 80 or older, your body mass index should be between 23-30. Your Body mass index is 43.5 kg/m. If this is out of the aforementioned range listed, please consider follow up with your Primary Care Provider.  If you are age 1 or younger, your body mass index should be between 19-25. Your Body mass index is 43.5 kg/m. If this is out of the aformentioned range listed, please consider follow up with your Primary Care Provider.   ________________________________________________________  The [facility] providers would like to encourage you to use MYCHART to communicate with providers for non-urgent requests or questions.  Due to long hold times on the telephone, sending your provider a message by [facility] may be a faster and more efficient way to get a response.  Please allow 48 business hours for a response.  Please remember that this is for non-urgent requests.  _______________________________________________________  [facility] is using a team-based approach to care.  Your team is made up of your doctor and two to three APPS. Our APPS (Nurse Practitioners and Physician Assistants) work with your physician to ensure care continuity for you. They are fully qualified to address your health concerns and develop a treatment plan. They communicate directly with your gastroenterologist to care for you. Seeing the Advanced Practice Practitioners on your physician's team can help you by facilitating care more promptly, often allowing for earlier appointments, access to diagnostic testing, procedures, and other specialty referrals.    Due to recent changes in healthcare laws, you may see the results of your imaging and laboratory studies on MyChart before your provider has had a chance to review them.  We understand that in some cases there may be results that are confusing or concerning to you. Not all laboratory results  come back in  the same time frame and the provider may be waiting for multiple results in order to interpret others.  Please give us  48 hours in order for your provider to thoroughly review all the results before contacting the office for clarification of your results.

## 2024-10-05 ENCOUNTER — Encounter: Payer: Self-pay | Admitting: Occupational Therapy

## 2024-10-05 ENCOUNTER — Ambulatory Visit: Admitting: Occupational Therapy

## 2024-10-05 DIAGNOSIS — R278 Other lack of coordination: Secondary | ICD-10-CM

## 2024-10-05 DIAGNOSIS — M25611 Stiffness of right shoulder, not elsewhere classified: Secondary | ICD-10-CM

## 2024-10-05 DIAGNOSIS — R29818 Other symptoms and signs involving the nervous system: Secondary | ICD-10-CM

## 2024-10-05 DIAGNOSIS — M25622 Stiffness of left elbow, not elsewhere classified: Secondary | ICD-10-CM

## 2024-10-05 DIAGNOSIS — M25612 Stiffness of left shoulder, not elsewhere classified: Secondary | ICD-10-CM

## 2024-10-05 DIAGNOSIS — M25621 Stiffness of right elbow, not elsewhere classified: Secondary | ICD-10-CM

## 2024-10-05 NOTE — Therapy (Signed)
 " OUTPATIENT OCCUPATIONAL THERAPY [facility] treatment  Patient Name: Tara [name] MRN: [id number] DOB:[birth year], 69 y.o., female Today's Date: 10/05/2024  PCP: Dr. Perri REFERRING PROVIDER: Dr. Perri  END OF SESSION:  OT End of Session - 10/05/24 1238     Visit Number 11    Number of Visits 25    Date for Recertification  10/12/24    Authorization Type Medicare & AARP    Authorization - Visit Number 11    Progress Note Due on Visit 20    OT Start Time 1232    OT Stop Time 1315    OT Time Calculation (min) 43 min    Activity Tolerance Patient tolerated treatment well    Behavior During Therapy [facility] for tasks assessed/performed                  Past Medical History:  Diagnosis Date   Allergy    Anxiety    Arthritis    Back pain    Chronic edema    Chronic hepatitis C without hepatic coma (HCC) 10/29/2012   Chronic pain syndrome 07/01/2018   Cirrhosis (HCC) 05/10/2017   Class 2 severe obesity with serious comorbidity and body mass index (BMI) of 37.0 to 37.9 in adult 10/25/2018   Cold hands and feet    Complication of anesthesia    difficulty waking up/dizzy/lightheaded   Controlled type 2 diabetes mellitus without complication, without long-term current use of insulin  (HCC) 07/11/2018   Cough    Dry eye syndrome of both eyes 09/02/2022   Dysrhythmia, cardiac 09/28/2011   She has no records with her today. She tells me that a few years ago in [place] she had palpitations and a cardiology placed her on digoxin , she does not know what type of dysrhythmia. She has not had any palpitations recently     Environmental allergies    Eye pain    Fatigue    Gastroesophageal reflux disease 11/05/2015   H/O varicella    H/O varicose veins    Hepatic cirrhosis (HCC)    Hepatitis C    History of breast cancer 01/15/2020   History of cardiac arrhythmia 01/15/2020   History of chemotherapy 2013   left breast cancer   History of hepatitis C 01/15/2020   History of left  hip replacement 08/29/2018   History of measles, mumps, or rubella    History of osteoporosis 01/15/2020   History of total right hip replacement 08/29/2018   History of TTP (thrombotic thrombocytopenic purpura) 10/29/2012   Hx of radiation therapy 12/15/11 - 01/29/12   left breast   Hyperlipidemia    Hypokalemia 12/29/2013   Hypothyroidism    Intrinsic atopic dermatitis 01/15/2020   Irregular heart beat    under control   Joint pain    Leg cramps    Lymphedema of arm 12/15/2011   Malignant neoplasm of axillary tail of left breast in female, estrogen receptor positive (HCC) 08/12/2015   Malignant neoplasm of upper-outer quadrant of left breast in female, estrogen receptor positive (HCC) 07/17/2014   Moderate persistent asthma without complication 01/15/2020   Neuromuscular disorder (HCC)    left foot nerve damage- neuropathy    Oral candidiasis 09/02/2022   Osteopenia    Other allergic rhinitis 10/29/2012   Palpitations    Parkinson's disease (HCC)    Prediabetes 10/25/2018   Radiculopathy 07/2020   Rheumatoid arthritis (HCC)    Ringing in ear    Rosacea, acne 09/28/2011   Severe persistent asthma  without complication (HCC) 09/02/2022   Status post bilateral hip replacements 10/29/2012   Swallowing difficulty    Swelling of both lower extremities    Thrombocytopenia, primary (HCC)    TTP (thrombotic thrombocytopenic purpura) (HCC)    1982   Vitamin D  deficiency 10/25/2018   Past Surgical History:  Procedure Laterality Date   BREAST LUMPECTOMY  08/03/11   left lumpectomy and slnbx,T1cN0,triple pos   CATARACT EXTRACTION, BILATERAL     COLONOSCOPY  01/26/2019   COLONOSCOPY  08/12/2020   elbow pins Left    FOOT SURGERY Left    multiple   POLYPECTOMY     PORTACATH PLACEMENT  08/03/2011   Procedure: INSERTION PORT-A-CATH;  Surgeon: Deward GORMAN Curvin DOUGLAS, MD;  Location: [facility] OR;  Service: General;  Laterality: Right;   portacath removal     THORACIC DISCECTOMY Left 06/21/2023    Procedure: Laminectomy and Foraminotomy - left - Thoracic eleven-Thoracic twelve;  Surgeon: Louis Shove, MD;  Location: [facility] OR;  Service: Neurosurgery;  Laterality: Left;   TOTAL HIP ARTHROPLASTY Bilateral    UPPER GASTROINTESTINAL ENDOSCOPY     UPPER GASTROINTESTINAL ENDOSCOPY  08/12/2020   Patient Active Problem List   Diagnosis Date Noted   Thoracic radiculopathy 06/21/2023   PSVT (paroxysmal supraventricular tachycardia) (HCC) asymptomatic short runs on event monitor September 2024 06/03/2023   Mitral regurgitation 06/03/2023   Preoperative cardiovascular examination 06/03/2023   Allergy    Anxiety    Back pain    Chronic edema    Cold hands and feet    Complication of anesthesia    Environmental allergies    Eye pain    Fatigue    H/O varicella    H/O varicose veins    Hepatic cirrhosis (HCC)    Hepatitis C    History of measles, mumps, or rubella    Hyperlipidemia    Irregular heart beat    Joint pain    Leg cramps    Neuromuscular disorder (HCC)    Osteopenia    Palpitations    Rheumatoid arthritis (HCC)    Ringing in ear    Swallowing difficulty    Thrombocytopenia, primary (HCC)    TTP (thrombotic thrombocytopenic purpura) (HCC)    Severe persistent asthma without complication (HCC) 09/02/2022   Oral candidiasis 09/02/2022   Dry eye syndrome of both eyes 09/02/2022   Radiculopathy 07/2020   Moderate persistent asthma without complication 01/15/2020   History of cardiac arrhythmia 01/15/2020   History of breast cancer 01/15/2020   History of osteoporosis 01/15/2020   Intrinsic atopic dermatitis 01/15/2020   History of hepatitis C 01/15/2020   Cough 01/15/2020   Arthritis 11/10/2019   Prediabetes 10/25/2018   Class 2 severe obesity with serious comorbidity and body mass index (BMI) of 37.0 to 37.9 in adult 10/25/2018   Vitamin D  deficiency 10/25/2018   History of left hip replacement 08/29/2018   History of total right hip replacement 08/29/2018    Controlled type 2 diabetes mellitus without complication, without long-term current use of insulin  (HCC) 07/11/2018   Chronic pain syndrome 07/01/2018   Cirrhosis (HCC) 05/10/2017   Gastroesophageal reflux disease 11/05/2015   Malignant neoplasm of axillary tail of left breast in female, estrogen receptor positive (HCC) 08/12/2015   Malignant neoplasm of upper-outer quadrant of left breast in female, estrogen receptor positive (HCC) 07/17/2014   Hypokalemia 12/29/2013   Other allergic rhinitis 10/29/2012   Status post bilateral hip replacements 10/29/2012   History of TTP (thrombotic thrombocytopenic purpura) 10/29/2012  Chronic hepatitis C without hepatic coma (HCC) 10/29/2012   Hx of radiation therapy    Lymphedema of arm 12/15/2011   Rosacea, acne 09/28/2011   Dysrhythmia, cardiac 09/28/2011   Hypothyroidism 09/07/2011   History of chemotherapy 2013    ONSET DATE: 05/29/24- referral date  REFERRING DIAG:  Diagnosis  G20.A1 (ICD-10-CM) - Parkinson's disease, unspecified whether dyskinesia present, unspecified whether manifestations fluctuate (HCC)    THERAPY DIAG:  Other symptoms and signs involving the nervous system  Stiffness of left shoulder, not elsewhere classified  Stiffness of right shoulder, not elsewhere classified  Other lack of coordination  Stiffness of left elbow, not elsewhere classified  Rationale for Evaluation and Treatment: Rehabilitation  SUBJECTIVE:   SUBJECTIVE STATEMENT: Pt reports she would like to continue with OT after next week  Pt accompanied by: significant other  PERTINENT HISTORY: Hx per chart- 69 year old female with  complicated medical history of asthma, hypothyroidism, reflux disease, diabetes, hyperlipidemia, history of breast cancer with status postsurgery and chemoradiation, anxiety, depression, history of hepatitis C in the context of blood transfusion, with a history of TTP, chronic liver disease, chronic back pain, rheumatoid  arthritis, status post multiple surgeries including breast, back,  lumpectomy, cataract extractions, left foot surgery, polypectomy, bilateral total hip arthroplasties, chronic edema, chronic pain syndrome, and severe obesity with a BMI of over 40,  MD recommends starting carbidopa / levadopa 3x daily   Dat scan results IMPRESSION: Bilateral decreased radiotracer activity within LEFT and RIGHT striatum. Findings suggest Parkinsonian syndrome pathology.  PRECAUTIONS: Fall, lymphedema, hx of breast CA, hx of back surgery 1 year ago, hx of hep C  WEIGHT BEARING RESTRICTIONS: No  PAIN: PAIN:  Are you having pain? Yes: NPRS scale: 4 Pain location: 4/10   knee Pain description: aching Aggravating factors: high reaching Relieving factors: repositioning    FALLS: Has patient fallen in last 6 months? No  LIVING ENVIRONMENT: Lives with: lives with their spouse Lives in: House/apartment   PLOF: Needs assistance with ADLs and Needs assistance with homemaking  PATIENT GOALS: manage tremors  OBJECTIVE:  Note: Objective measures were completed at Evaluation unless otherwise noted.  HAND DOMINANCE: Right  ADLs: Overall ADLs: increased time required Transfers/ambulation related to ADLs: Eating: mod I Grooming: mod I UB Dressing: needs assist donning shirt, jacket and adjusting bra straps LB Dressing: wears special slip on shoes  Toileting: wears pad for incontinence,  Bathing:mod I  Tub Shower transfers: walk in shower and tub, mod I  IADLs: Handwriting: 75% legible and Mild micrographia  MOBILITY STATUS: Independent, small steps, decreased pace  POSTURE COMMENTS:  rounded shoulders and forward head  ACTIVITY TOLERANCE: Activity tolerance: decreased activity tolerance, fatigues quickly  FUNCTIONAL OUTCOME MEASURES: Fastening/unfastening 3 buttons: 45.67 Physical performance test: PPT#2 (simulated eating) 16.09 & PPT#4 (donning/doffing jacket): unable  COORDINATION: 9 Hole  Peg test: Right: 30.87 sec; Left: 42.91 sec Box and Blocks:  Right 44blocks, Left 39 blocks Tremors: Resting  UE ROM:  shoulder flexion: RUE 90, LUE 70, elbow extension: RUE -20, LUE -45 hx of elbow fx s/p surgery, elbow flexion 140 Pt with significant lymphedema in LUE s/p breast CA  SENSATION: Not tested  COGNITION: Overall cognitive status: to be [facility] for taks performed, further assess in a functional context  OBSERVATIONS: Bradykinesia and bilateral resting tremor left greater than right  TREATMENT DATE: 10/05/24 PWR! rock modified quadraped at sink,  as well as PWR! step 5-10 reps each, min v.c and facilitation,  transitioned to seated on mat For PWR! up and rock, with modifications for LUE , mod v.c Bag exercises for : crumpling in hand, then over head for simulated donning shirt, min-mod v.c attempted pass behind back however discontinued due to pain Increased time and frequent rest breaks required  10/03/24- PWR! rock modified quadraped at sink, transitioned to seated on mat For PWR! rock, then lateral trunk flexion stretch over ball to left and right sides due to rib cramps, min v.c. Standing facing mat to roll ball forwards and backwards for elbow extension and shoulder flexion, min v.c and facilitation. Therapist reviewed strategy for donning vest/ doffing, mod v.c and encouragement Therapist checked progress towards goals in prep for progress note. see below Pt was cautioned not to use treadmill due to fall risk with shuffling gait. Therapist assisted pt with attaching cup holder to rollator.  09/21/24:  Reviewed and re-issued coordination HEP, min cueing for incr movement amplitude--see pt instructions. Seated marching and stepping in/out to assist with car transfer/LB dressing.  Pt with significant difficulty with LLE.  Also instructed pt/husband in use of car cane to  assist with car transfers--shown picture and explained/wrote down name. Discussed/provided general education regarding sleep and GI symptoms associated with PD to incr awareness.    09/19/24- Supine PWR! up then chest press with foam roll, min v.c Attempted shoulder flexion however it was painful so transitioned to seated low range shoulder flexion and chest press with foam roll, min-mod v.c Seated PWR! step at corner of mat and seated marching mod v.c for LLE amplitude. Pt reports difficulty with car transfers due to difficulty moving leg. Standing at countertop modified quadraped rock in mid- range, then dynamic step and reach to left and right sides to flip playing cards.  foam grips issued for self feeding. Practice donning/ doffing jacket with big movements dressing LUE first, donning like a cape mod v.c and encouragement needed. Pt continues to fatigue quickly and she requires rest breaks.  09/13/24- Disscussion with pt/husband regarding how everything is going at home. Pt reports difficulty holding a measuring spoon with LUE while pouring spices with RUE.  Therapist recommends that pt considers keeping spice jar on countertop, and holds base with LUE while scooping with RUE. Standing to scoop items from bowl with RUE, min v.c to choke down on the spoon Standing to donn/ doff jacket like a cape dressing  LUE first, min-mod v.c and min facilitation, pt with improved performance follow practice second trial. PWR !hands basic 4 min v.c for amplitude Fastening and unfastening buttons with adapted strategy, min v.c  Chest press with foam noodle in seated. Ambulating to waiting room with new rollator, min v.c for big steps 3 button/ unbutton:28.55 secs  09/09/23:   In supine, PWR! Up and closed-chain chest press and shoulder flex with foam noodle with min facilitation and cueing for large amplitude and elbow ext and shoulder positioning.  Supine>sitting after education regarding large amplitude  movement strategies and avoid pulling with UE due to risk of injury/pain.   Sitting, PWR! Up with min cueing for large amplitude movements.    Discussed activities pt is performing with personal trainer.  Recommended against overhead weights (now only doing rows with theraband) and avoid exercises that cause pain.  Recommended pt discuss difficulty sleeping with neurologist.  Pt demo difficulty with voice when initially laid down,  but denied difficulty breathing.    Checked Box and blocks with LUE--see goals section below.  Writing 1 sentence with good legibility/size with printing.  Then in cursive with min decr in size, but good legibility.  Reviewed strategies (moving wrist, built-up grip, lined paper, fill up space/write big, PWR! Hands).    Pt educated in the following ADLs strategies to incr ease/safety and decr risk of future complications:   sit>stand (large amplitude strategies with feet apart and forward lean), supine>sit (walking over with feet, roll, and push to sit),  donning/doffing jacket x1 (doffed with min-mod cueing and donned with min A)--incorporating trunk rotation/looking at hand, positioning of shoulder/UE with reaching and use of head turns/vision with reaching   08/21/24-PWR! hands basic 4, 5-10 reps each, min v.c for positioning, amplitude  followed by flipping, dealing and stacking coins with big movements min v.c Pt practiced carrying a plate in right and left hands, min v.c for techniques Therapist recommends pt has assistance if she goes to a buffet for Christmas so she does not drop her plate. Pt was shown the walker tray attachement and she practiced ambulating and using to carry a plate and water bottle 20 ft. Pt practiced writing with foam grip and min-mod v.c for techniques. Mod micrographia initally then pt wrote 3 sentences with good legibility and min decrease in letter size. Pt doffed vest without assist today, min A mod v.c to donn  08/14/24 PWR! up  supine, closed chain chest press, min v.c Rolling like a log with PWR! twist to sit up. PWR! up, rock and step in seated mod v.c  rest break in between exercises. Donning vest with big movements min v.c / assist. Pt arrived with her walker today and she demonstrates improved mobility. Pt to show her trainer copies of exercises.  08/14/24 PWR! up supine, closed chain chest press, min v.c Rolling like a log with PWR! twist to sit up. PWR! up, rock and step in seated mod v.c  rest break in between exercises. Donning vest with big movments min v.c / assist. Pt arrived with her walker today and she demonstrates improved mobility. Pt to show her trainer copies of exercises.  08/07/24-Supine PWR! up followed by closed chain chest press and shoulder flexion,min- v.c and facilitation  Low range seated chest press and shoulder flexion, min v.c Standing PWR! step at countertip close supervision/ minguard and min-mod v.c  Marching at countertop, min v.c, PWR! up for sit to stand Beginning coordination and PWR! hands, see pt instructions min-mod v.c   07/24/24-Supine PWR! up followed by closed chain chest press and shoulder flexion,min-mod v.c and facilitation  Pt performed shoulder abduction followed by clap overhead in supine, min-mod v.c    07/20/24- eval    PATIENT EDUCATION: Education details: PWR! up and rock seated, PWR! rock at sink, modified quadraped, PWR! step at [employer] educated: Patient and Spouse Education method: Explanation and Handouts, demonstration Education comprehension: verbalized understanding, returned demonstration, verbal cues required, and needs further education  HOME EXERCISE PROGRAM: supine cane, PWR! hands, flipping and dealing cards 11/24 09/09/23:  ADL strategies: handwriting recommendations review, sit>stand, supine>sit,  donning/doffing jacket, positioning of shoulder/UE with reaching and use of head turns/vision  GOALS: Goals reviewed with patient?  Yes  SHORT TERM GOALS: Target date: 08/19/24  I with HEP Baseline: Goal status:  met for low range foam noodle shoulder flexion, chest press 09/19/24, 09/21/24 met with coordination HEP  2.  I with adapted strategies/ AE to maximize pt's  safety and I with ADLs/IADLS. Baseline:  Goal status: ongoing 08/14/24, needs reinforcement 10/03/24  3.  Pt will demonstrate improved LUE functional use as evidenced by increasing box/blocks by 3 blocks Baseline: R 44, L 39 Goal status:    Ongoing.  09/09/23:  L-38 blocks, ongoing improved but not met 10/03/24-40 blocks   4.  Pt will demonstrate improved ease with feeding as evidenced by decreasing PPT#2 to 13 secs or less. Baseline: 16.09 sec Goal status: 13.54 secs  09/19/24, met 12.30secs with foam grip 10/03/24  5.  Pt will demonstrate ability to write a sentence with 100% legibility and minimimal decrease in letter size. Goal status: 09/09/23  Met today at this level (printing)   LONG TERM GOALS: Target date: 10/12/24  Pt will demonstrate improved fine motor coordination for ADLs as evidenced by decreasing 9 hole peg test score for LUE by 3 secs. Baseline: R 30.87, L 42.91 Goal status:met 31.02 secs 10/03/24  2.  Pt will demonstrate improved ease with fastening buttons as evidenced by decreasing 3 button/ unbutton score to 42 secs or less. Baseline:  Goal status: met, 09/13/24 28.55 secs  3.  Pt will verbalize understanding of community resources and ways to prevent PD related complications. Baseline:  Goal status: ongoing 10/03/24  4.  Pt will demonstrate ability to retrieve a lightweight object at 100 shoulder flexion with -15 elbow extension with RUE. Goal status: 10/03/24- 105. -15 met  5.  Pt will demonstrate ability to retrieve a lightweight object at 80 shoulder flexion with LUE Goal status: met, 80*, 10/03/24  ASSESSMENT:  CLINICAL IMPRESSION: Pt is progressing towards goals. She demonstrates understanding of updates to HEP.   PERFORMANCE  DEFICITS: in functional skills including ADLs, IADLs, coordination, dexterity, edema, ROM, strength, flexibility, Fine motor control, Gross motor control, mobility, balance, endurance, decreased knowledge of precautions, and decreased knowledge of use of DME,  and psychosocial skills including coping strategies, environmental adaptation, habits, interpersonal interactions, and routines and behaviors.   IMPAIRMENTS: are limiting patient from ADLs, IADLs, rest and sleep, play, leisure, and social participation.   COMORBIDITIES:  may have co-morbidities  that affects occupational performance. Patient will benefit from skilled OT to address above impairments and improve overall function.  MODIFICATION OR ASSISTANCE TO COMPLETE EVALUATION: No modification of tasks or assist necessary to complete an evaluation.  OT OCCUPATIONAL PROFILE AND HISTORY: Detailed assessment: Review of records and additional review of physical, cognitive, psychosocial history related to current functional performance.  CLINICAL DECISION MAKING: Moderate - several treatment options, min-mod task modification necessary  REHAB POTENTIAL: Good  EVALUATION COMPLEXITY: Low    PLAN:  OT FREQUENCY: 2x/week plus eval  OT DURATION: 12 weeks  PLANNED INTERVENTIONS: 97168 OT Re-evaluation, 97535 self care/ADL training, 02889 therapeutic exercise, 97530 therapeutic activity, 97112 neuromuscular re-education, 97140 manual therapy, 97113 aquatic therapy, 97035 ultrasound, 97018 paraffin, 02989 moist heat, 97010 cryotherapy, 97750 Physical Performance Testing, 02239 Orthotic Initial, 97763 Orthotic/Prosthetic subsequent, passive range of motion, balance training, functional mobility training, visual/perceptual remediation/compensation, psychosocial skills training, energy conservation, coping strategies training, patient/family education, and DME and/or AE instructions  RECOMMENDED OTHER SERVICES: Pt may benefit from PT in the future  for lymphadema  CONSULTED AND AGREED WITH PLAN OF CARE: Patient and family member/caregiver  PLAN FOR NEXT SESSION:  plan to renew next week, request lymphedema order  Najah Liverman, OTR/L 10/05/2024, 1:08 PM   "

## 2024-10-06 LAB — AFP TUMOR MARKER: AFP-Tumor Marker: 3.7 ng/mL

## 2024-10-07 ENCOUNTER — Ambulatory Visit (HOSPITAL_BASED_OUTPATIENT_CLINIC_OR_DEPARTMENT_OTHER)

## 2024-10-10 ENCOUNTER — Ambulatory Visit: Admitting: Occupational Therapy

## 2024-10-12 ENCOUNTER — Ambulatory Visit: Payer: Self-pay | Admitting: Occupational Therapy

## 2024-10-17 ENCOUNTER — Encounter: Admitting: Internal Medicine

## 2024-10-17 ENCOUNTER — Ambulatory Visit: Admitting: Occupational Therapy

## 2024-10-19 ENCOUNTER — Ambulatory Visit: Admitting: Occupational Therapy

## 2024-10-24 ENCOUNTER — Ambulatory Visit: Admitting: Occupational Therapy

## 2024-11-10 ENCOUNTER — Ambulatory Visit

## 2024-11-14 ENCOUNTER — Ambulatory Visit: Admitting: Internal Medicine

## 2024-11-14 ENCOUNTER — Ambulatory Visit

## 2025-02-20 ENCOUNTER — Other Ambulatory Visit

## 2025-02-22 ENCOUNTER — Ambulatory Visit: Admitting: Internal Medicine

## 2025-04-16 ENCOUNTER — Ambulatory Visit: Admitting: Adult Health
